# Patient Record
Sex: Male | Born: 1965 | Race: White | Hispanic: No | Marital: Married | Smoking: Never smoker
Health system: Southern US, Community
[De-identification: ages and names within clinical notes are randomized; demographics above are authoritative.]

## PROBLEM LIST (undated history)

## (undated) DIAGNOSIS — IMO0001 Reserved for inherently not codable concepts without codable children: Secondary | ICD-10-CM

## (undated) DIAGNOSIS — E785 Hyperlipidemia, unspecified: Secondary | ICD-10-CM

## (undated) DIAGNOSIS — I10 Essential (primary) hypertension: Secondary | ICD-10-CM

## (undated) HISTORY — DX: Essential (primary) hypertension: I10

## (undated) HISTORY — DX: Hyperlipidemia, unspecified: E78.5

## (undated) HISTORY — PX: LUMBAR DISC SURGERY: SHX700

## (undated) HISTORY — DX: Reserved for inherently not codable concepts without codable children: IMO0001

---

## 2010-07-15 DIAGNOSIS — IMO0001 Reserved for inherently not codable concepts without codable children: Secondary | ICD-10-CM

## 2010-07-15 HISTORY — DX: Reserved for inherently not codable concepts without codable children: IMO0001

## 2011-05-09 ENCOUNTER — Encounter: Payer: Self-pay | Admitting: Internal Medicine

## 2011-05-10 ENCOUNTER — Encounter: Payer: Self-pay | Admitting: Internal Medicine

## 2011-05-10 ENCOUNTER — Ambulatory Visit (INDEPENDENT_AMBULATORY_CARE_PROVIDER_SITE_OTHER): Payer: Managed Care, Other (non HMO) | Admitting: Internal Medicine

## 2011-05-10 DIAGNOSIS — E119 Type 2 diabetes mellitus without complications: Secondary | ICD-10-CM

## 2011-05-10 DIAGNOSIS — E785 Hyperlipidemia, unspecified: Secondary | ICD-10-CM

## 2011-05-10 DIAGNOSIS — I10 Essential (primary) hypertension: Secondary | ICD-10-CM

## 2011-05-10 DIAGNOSIS — E669 Obesity, unspecified: Secondary | ICD-10-CM

## 2011-05-10 NOTE — Patient Instructions (Addendum)
Your weight is stable.  Continue to restrict your starches whenever possible  And add 30 minutes of brisk walking 5 times weekly to help your weight   Consider looking to the Medifast diet (available online) if the Atkins diet doesn't get you the results you want.   Return for labs at your convenience next week or so.

## 2011-05-12 ENCOUNTER — Encounter: Payer: Self-pay | Admitting: Internal Medicine

## 2011-05-12 DIAGNOSIS — I10 Essential (primary) hypertension: Secondary | ICD-10-CM | POA: Insufficient documentation

## 2011-05-12 DIAGNOSIS — E785 Hyperlipidemia, unspecified: Secondary | ICD-10-CM | POA: Insufficient documentation

## 2011-05-12 DIAGNOSIS — IMO0002 Reserved for concepts with insufficient information to code with codable children: Secondary | ICD-10-CM | POA: Insufficient documentation

## 2011-05-12 DIAGNOSIS — E1165 Type 2 diabetes mellitus with hyperglycemia: Secondary | ICD-10-CM | POA: Insufficient documentation

## 2011-05-12 DIAGNOSIS — E669 Obesity, unspecified: Secondary | ICD-10-CM | POA: Insufficient documentation

## 2011-05-12 NOTE — Assessment & Plan Note (Signed)
Spent 10 minutes counselling him on his weight,  Recommended the MediFast diet .  fassting lipids also ordered.

## 2011-05-12 NOTE — Progress Notes (Signed)
  Subjective:    Patient ID: Connor Ross, male    DOB: 07/11/1966, 45 y.o.   MRN: [id number]  HPI  45 yo white male with history of obesity, hyperlipidemia and DM presents for followup..  He has not been exercising buth a recently joined a gym and is planning to start an exericse program soon.  His blood sugars have been well controlled by recent checks.      Review of Systems     Objective:   Physical Exam        Assessment & Plan:

## 2011-05-12 NOTE — Assessment & Plan Note (Addendum)
Last A1c was 7.2 in May without medications.  He was determined to control his diabetes with diet and nutraceuticals alone. However, he has gained 3 more lbs since May.  Will repeat A1c and recommend therapy if higher.

## 2011-05-17 ENCOUNTER — Other Ambulatory Visit (INDEPENDENT_AMBULATORY_CARE_PROVIDER_SITE_OTHER): Payer: Managed Care, Other (non HMO) | Admitting: *Deleted

## 2011-05-17 DIAGNOSIS — E119 Type 2 diabetes mellitus without complications: Secondary | ICD-10-CM

## 2011-05-17 DIAGNOSIS — E785 Hyperlipidemia, unspecified: Secondary | ICD-10-CM

## 2011-05-17 LAB — COMPREHENSIVE METABOLIC PANEL
Alkaline Phosphatase: 58 U/L (ref 39–117)
BUN: 17 mg/dL (ref 6–23)
Glucose, Bld: 194 mg/dL — ABNORMAL HIGH (ref 70–99)
Sodium: 135 mEq/L (ref 135–145)
Total Bilirubin: 0.7 mg/dL (ref 0.3–1.2)

## 2011-05-17 LAB — LIPID PANEL
HDL: 35.2 mg/dL — ABNORMAL LOW (ref >39.00)
LDL Cholesterol: 128 mg/dL — ABNORMAL HIGH (ref 0–99)
VLDL: 36 mg/dL (ref 0.0–40.0)

## 2011-05-17 LAB — MICROALBUMIN / CREATININE URINE RATIO
Creatinine,U: 117.9 mg/dL
Microalb Creat Ratio: 0.7 mg/g (ref 0.0–30.0)

## 2011-05-20 ENCOUNTER — Other Ambulatory Visit: Payer: Self-pay | Admitting: Internal Medicine

## 2011-05-20 DIAGNOSIS — R739 Hyperglycemia, unspecified: Secondary | ICD-10-CM

## 2011-05-20 DIAGNOSIS — E7800 Pure hypercholesterolemia, unspecified: Secondary | ICD-10-CM

## 2011-07-11 ENCOUNTER — Encounter: Payer: Self-pay | Admitting: Internal Medicine

## 2011-08-09 ENCOUNTER — Other Ambulatory Visit (INDEPENDENT_AMBULATORY_CARE_PROVIDER_SITE_OTHER): Payer: Managed Care, Other (non HMO) | Admitting: *Deleted

## 2011-08-09 DIAGNOSIS — E7800 Pure hypercholesterolemia, unspecified: Secondary | ICD-10-CM

## 2011-08-09 DIAGNOSIS — R739 Hyperglycemia, unspecified: Secondary | ICD-10-CM

## 2011-08-09 DIAGNOSIS — R7309 Other abnormal glucose: Secondary | ICD-10-CM

## 2011-08-09 LAB — COMPREHENSIVE METABOLIC PANEL
Albumin: 4.1 g/dL (ref 3.5–5.2)
CO2: 25 mEq/L (ref 19–32)
Calcium: 9 mg/dL (ref 8.4–10.5)
GFR: 122.18 mL/min (ref >60.00)
Glucose, Bld: 214 mg/dL — ABNORMAL HIGH (ref 70–99)
Potassium: 4 mEq/L (ref 3.5–5.1)
Sodium: 136 mEq/L (ref 135–145)
Total Protein: 7 g/dL (ref 6.0–8.3)

## 2011-08-09 LAB — HEMOGLOBIN A1C: Hgb A1c MFr Bld: 9 % — ABNORMAL HIGH (ref 4.6–6.5)

## 2011-08-16 ENCOUNTER — Ambulatory Visit (INDEPENDENT_AMBULATORY_CARE_PROVIDER_SITE_OTHER): Payer: Managed Care, Other (non HMO) | Admitting: Internal Medicine

## 2011-08-16 VITALS — BP 126/78 | HR 84 | Temp 98.6°F | Ht 72.0 in | Wt 325.0 lb

## 2011-08-16 DIAGNOSIS — IMO0001 Reserved for inherently not codable concepts without codable children: Secondary | ICD-10-CM

## 2011-08-16 DIAGNOSIS — E119 Type 2 diabetes mellitus without complications: Secondary | ICD-10-CM

## 2011-08-16 DIAGNOSIS — Z6835 Body mass index (BMI) 35.0-35.9, adult: Secondary | ICD-10-CM

## 2011-08-16 DIAGNOSIS — E669 Obesity, unspecified: Secondary | ICD-10-CM

## 2011-08-16 DIAGNOSIS — E1159 Type 2 diabetes mellitus with other circulatory complications: Secondary | ICD-10-CM

## 2011-08-16 DIAGNOSIS — E785 Hyperlipidemia, unspecified: Secondary | ICD-10-CM

## 2011-08-16 DIAGNOSIS — E1165 Type 2 diabetes mellitus with hyperglycemia: Secondary | ICD-10-CM

## 2011-08-16 DIAGNOSIS — IMO0002 Reserved for concepts with insufficient information to code with codable children: Secondary | ICD-10-CM

## 2011-08-16 MED ORDER — FREESTYLE LANCETS MISC: 1.0000 | Freq: Two times a day (BID) | Status: AC

## 2011-08-16 MED ORDER — INSULIN PEN NEEDLE 31G X 6 MM MISC: Status: DC

## 2011-08-16 MED ORDER — GLUCOSE BLOOD VI STRP: ORAL_STRIP | Status: AC

## 2011-08-16 NOTE — Progress Notes (Signed)
Subjective:    Patient ID: Connor Ross, male    DOB: 09/24/1965, 46 y.o.   MRN: [id number]  HPI  Connor Ross  is a 46 year old white male with a history of diabetes mellitus type 2 , hyperlipidemia, obesity,  hypertension and degenerative joint disease who returns for a three-month diabetes checkup over the last year he has had increasing loss of control of his diabetes due to his continued resistance to taking medication  He has been restricting his starches and to the best of his knowledge but does not exercise daily do to multiple joint complaints. He has gained 10 pounds since his last visit .  Past Medical History  Diagnosis Date  . Diabetes mellitus   . Hypertension   . Hyperlipidemia    No current outpatient prescriptions on file prior to visit.     Review of Systems  Constitutional: Negative for fever, chills, diaphoresis, activity change, appetite change, fatigue and unexpected weight change.  HENT: Negative for hearing loss, ear pain, nosebleeds, congestion, sore throat, facial swelling, rhinorrhea, sneezing, drooling, mouth sores, trouble swallowing, neck pain, neck stiffness, dental problem, voice change, postnasal drip, sinus pressure, tinnitus and ear discharge.   Eyes: Negative for photophobia, pain, discharge, redness, itching and visual disturbance.  Respiratory: Negative for apnea, cough, choking, chest tightness, shortness of breath, wheezing and stridor.   Cardiovascular: Negative for chest pain, palpitations and leg swelling.  Gastrointestinal: Negative for nausea, vomiting, abdominal pain, diarrhea, constipation, blood in stool, abdominal distention, anal bleeding and rectal pain.  Genitourinary: Negative for dysuria, urgency, frequency, hematuria, flank pain, decreased urine volume, scrotal swelling, difficulty urinating and testicular pain.  Musculoskeletal: Positive for arthralgias. Negative for myalgias, back pain, joint swelling and gait problem.  Skin:  Negative for color change, rash and wound.  Neurological: Negative for dizziness, tremors, seizures, syncope, speech difficulty, weakness, light-headedness, numbness and headaches.  Psychiatric/Behavioral: Negative for suicidal ideas, hallucinations, behavioral problems, confusion, sleep disturbance, dysphoric mood, decreased concentration and agitation. The patient is not nervous/anxious.        Objective:   Physical Exam  Constitutional: He is oriented to person, place, and time.       Obese, muscular   HENT:  Head: Normocephalic and atraumatic.  Mouth/Throat: Oropharynx is clear and moist.  Eyes: Conjunctivae and EOM are normal.  Neck: Normal range of motion. Neck supple. No JVD present. No thyromegaly present.  Cardiovascular: Normal rate, regular rhythm and normal heart sounds.   Pulmonary/Chest: Effort normal and breath sounds normal. He has no wheezes. He has no rales.  Abdominal: Soft. Bowel sounds are normal. He exhibits no mass. There is no tenderness. There is no rebound.  Musculoskeletal: Normal range of motion. He exhibits no edema.  Neurological: He is alert and oriented to person, place, and time.  Skin: Skin is warm and dry.  Psychiatric: He has a normal mood and affect.          Assessment & Plan:   Diabetes mellitus type 2, uncontrolled Despite his misgivings he has consented to use of insulin now that his hemoglobin A1c is 9.0. Spends 40 minutes today discussing diet and exercise as adjunct therapy to medications. Recommended that he begin an aerobic exercise program and submit a food diary to me to look for him carbohydrates and other causes of his continued weight gain. He was given samples of  Levemir flexpens and shown how to use them. He will start with 20 units daily plan return in one month with a log  of his blood sugars.  Severe obesity (BMI 35.0-35.9 with comorbidity) His BMI is over 40 was complicated by diabetes hyperlipidemia and DJD. I have been  counseling him for over 2 years on the need for daily aerobic exercise. He continues to site joint pain and work schedule as barriers to daily exercise. I have recommended swimming as a good part of exercise and have counseled him on his need to lose 100 pounds over the next year. There are several diets which would be helpful for him including manifest in weight watchers but he prefers to do things on his own  Hyperlipidemia LDL goal < 70 His LDL is 125 and his triglycerides are 2:15 by her recent fasting lipids. He will not consider statin therapy at this time. We will repeat his lipids in 3 months with his hemoglobin A1c and if his LDL has not improved I will strongly recommend a trial of radius rice or pravastatin.    Updated Medication List Outpatient Encounter Prescriptions as of 08/16/2011  Medication Sig Dispense Refill  . glucose blood (FREESTYLE LITE) test strip Use as directed - Check blood sugar twice daily DX 250.02  100 each  12  . insulin detemir (LEVEMIR) 100 UNIT/ML injection Inject 20 Units into the skin at bedtime.  3 mL  2  . Insulin Pen Needle 31G X 6 MM MISC Use with Levemir once daily - DX 250.02  30 each  6  . Lancets (FREESTYLE) lancets 1 each by Other route 2 (two) times daily. DX 250.02  100 each  12

## 2011-08-16 NOTE — Patient Instructions (Addendum)
You need to lose 100 lbs over the next year,  start once daily insulin  and begin a daily exercise to get your body to to get your sugars down  Start with 20 units of Levemir once daily in the evening.  Keep a diary of your food intake for 3 days and I will review it and make suggestions.  Check your blood sugars fasting and 2 hrs post dinner for 2 weeks and send to me .

## 2011-08-18 ENCOUNTER — Encounter: Payer: Self-pay | Admitting: Internal Medicine

## 2011-08-18 DIAGNOSIS — E669 Obesity, unspecified: Secondary | ICD-10-CM | POA: Insufficient documentation

## 2011-08-18 DIAGNOSIS — E785 Hyperlipidemia, unspecified: Secondary | ICD-10-CM | POA: Insufficient documentation

## 2011-08-18 DIAGNOSIS — R809 Proteinuria, unspecified: Secondary | ICD-10-CM | POA: Insufficient documentation

## 2011-08-18 DIAGNOSIS — E1129 Type 2 diabetes mellitus with other diabetic kidney complication: Secondary | ICD-10-CM | POA: Insufficient documentation

## 2011-08-18 DIAGNOSIS — E1159 Type 2 diabetes mellitus with other circulatory complications: Secondary | ICD-10-CM | POA: Insufficient documentation

## 2011-08-18 MED ORDER — INSULIN DETEMIR 100 UNIT/ML ~~LOC~~ SOLN: 20.0000 [IU] | Freq: Every day | SUBCUTANEOUS | Status: DC

## 2011-08-18 NOTE — Assessment & Plan Note (Signed)
His BMI is over 40 was complicated by diabetes hyperlipidemia and DJD. I have been counseling him for over 2 years on the need for daily aerobic exercise. He continues to site joint pain and work schedule as barriers to daily exercise. I have recommended swimming as a good part of exercise and have counseled him on his need to lose 100 pounds over the next year. There are several diets which would be helpful for him including manifest in weight watchers but he prefers to do things on his own

## 2011-08-18 NOTE — Assessment & Plan Note (Signed)
Despite his misgivings he has consented to use of insulin now that his hemoglobin A1c is 9.0. Spends 40 minutes today discussing diet and exercise as adjunct therapy to medications. Recommended that he begin an aerobic exercise program and submit a food diary to me to look for him carbohydrates and other causes of his continued weight gain. He was given samples of  Levemir flexpens and shown how to use them. He will start with 20 units daily plan return in one month with a log of his blood sugars.

## 2011-08-18 NOTE — Assessment & Plan Note (Signed)
His LDL is 125 and his triglycerides are 2:15 by her recent fasting lipids. He will not consider statin therapy at this time. We will repeat his lipids in 3 months with his hemoglobin A1c and if his LDL has not improved I will strongly recommend a trial of radius rice or pravastatin.

## 2011-08-20 ENCOUNTER — Telehealth: Payer: Self-pay | Admitting: *Deleted

## 2011-08-20 NOTE — Telephone Encounter (Signed)
Resume the metformin AND  increase the insulin to 24 units daily

## 2011-08-20 NOTE — Telephone Encounter (Signed)
Patient says that he has really been watching what he eats and is taking his 20 units of insulin a day. He says that his sugar is still running around 249. This was taken after eating about 6 oz of chicken and mixed veggies. He is asking if he needs to increase his insulin or if he should take the metformin along with the insulin.

## 2011-08-20 NOTE — Telephone Encounter (Signed)
Patient notified

## 2011-09-12 ENCOUNTER — Telehealth: Payer: Self-pay | Admitting: Internal Medicine

## 2011-09-12 NOTE — Telephone Encounter (Signed)
It's a little too early to repeat his labs,  They were done jan 25th and insurance won't pay if we repeat them before April 25th, so if he has been keeping a log of his blood sugars which we asked him to do ) he just needs to bring that.  If he hasn't been keeping a log or checking them,  Move his appt out to after April 25th and have him come for labs aroudn 4/25:  hgba1c  And CMET

## 2011-09-12 NOTE — Telephone Encounter (Signed)
Patient is scheduled for a follow up appointment on March 8th does he needs labs done before for coming if so he said he can come March 1st.

## 2011-09-12 NOTE — Telephone Encounter (Signed)
Left message asking patient to return my call.

## 2011-09-13 ENCOUNTER — Ambulatory Visit: Payer: Managed Care, Other (non HMO) | Admitting: Internal Medicine

## 2011-09-13 NOTE — Telephone Encounter (Signed)
Notified patient of message.  He has been keeping a log of blood sugars so he will bring that to his appt.

## 2011-09-20 ENCOUNTER — Encounter: Payer: Self-pay | Admitting: Internal Medicine

## 2011-09-20 ENCOUNTER — Ambulatory Visit (INDEPENDENT_AMBULATORY_CARE_PROVIDER_SITE_OTHER): Payer: Managed Care, Other (non HMO) | Admitting: Internal Medicine

## 2011-09-20 VITALS — BP 132/78 | HR 100 | Temp 97.9°F | Resp 16 | Ht 71.0 in | Wt 306.5 lb

## 2011-09-20 DIAGNOSIS — IMO0001 Reserved for inherently not codable concepts without codable children: Secondary | ICD-10-CM

## 2011-09-20 DIAGNOSIS — IMO0002 Reserved for concepts with insufficient information to code with codable children: Secondary | ICD-10-CM

## 2011-09-20 DIAGNOSIS — J019 Acute sinusitis, unspecified: Secondary | ICD-10-CM

## 2011-09-20 DIAGNOSIS — E785 Hyperlipidemia, unspecified: Secondary | ICD-10-CM

## 2011-09-20 DIAGNOSIS — E669 Obesity, unspecified: Secondary | ICD-10-CM

## 2011-09-20 DIAGNOSIS — E1165 Type 2 diabetes mellitus with hyperglycemia: Secondary | ICD-10-CM

## 2011-09-20 MED ORDER — AMOXICILLIN-POT CLAVULANATE 875-125 MG PO TABS: 1.0000 | ORAL_TABLET | Freq: Two times a day (BID) | ORAL | Status: AC

## 2011-09-20 NOTE — Patient Instructions (Addendum)
I am treating you for a sinus/ear infection with augmentin twice daly for 7 days  Gargle with salt water for sore throat.  Use Sudafed PE  10 to 30 mg every 6 hours  For congestion  Use OTC Simply Saline  Twice daily in AM and PM to flush sinuses  Try OTC Delsym or robitussin for the cough  ---------------------------------------------------------------------------------------------- Do not suspend the metformin even if sugars are < 120   The glipizide has to be taken just before a meal.   If you start having sugars below 80 fasting,  Reduce the levemir to 10 units at bedtime  And suspend the glipizide   Repeat your labs mid May

## 2011-09-20 NOTE — Progress Notes (Signed)
Subjective:    Patient ID: Connor Ross, male    DOB: [birth year], 46 y.o.   MRN: [id number]  HPI Connor Ross retruesn for followup on diabetes, with recent initiatin of levemir insulin for uncontrolled. Diabetes.  He is comfortable using the medication along with his oral medications, glipizide and metformin and is not having any hypoglycemic episodes.  He has altered his diet.  CBGs are now averaging 80 to 140.  He is taking his glipizide incorrectly  after dinner.  His 2 nd issue is persistent sinus drainage accompanied by headaches for the past  2-3 days.    Past Medical History  Diagnosis Date  . Diabetes mellitus   . Hypertension   . Hyperlipidemia     Current Outpatient Prescriptions on File Prior to Visit  Medication Sig Dispense Refill  . glucose blood (FREESTYLE LITE) test strip Use as directed - Check blood sugar twice daily DX 250.02  100 each  12  . Insulin Pen Needle 31G X 6 MM MISC Use with Levemir once daily - DX 250.02  30 each  6  . Lancets (FREESTYLE) lancets 1 each by Other route 2 (two) times daily. DX 250.02  100 each  12    Review of Systems  Constitutional: Negative for fever, chills, diaphoresis, activity change, appetite change, fatigue and unexpected weight change.  HENT: Positive for congestion, postnasal drip and sinus pressure. Negative for hearing loss, ear pain, nosebleeds, sore throat, facial swelling, rhinorrhea, sneezing, drooling, mouth sores, trouble swallowing, neck pain, neck stiffness, dental problem, voice change, tinnitus and ear discharge.   Eyes: Negative for photophobia, pain, discharge, redness, itching and visual disturbance.  Respiratory: Negative for apnea, cough, choking, chest tightness, shortness of breath, wheezing and stridor.   Cardiovascular: Negative for chest pain, palpitations and leg swelling.  Gastrointestinal: Negative for nausea, vomiting, abdominal pain, diarrhea, constipation, blood in stool, abdominal distention,  anal bleeding and rectal pain.  Genitourinary: Negative for dysuria, urgency, frequency, hematuria, flank pain, decreased urine volume, scrotal swelling, difficulty urinating and testicular pain.  Musculoskeletal: Negative for myalgias, back pain, joint swelling, arthralgias and gait problem.  Skin: Negative for color change, rash and wound.  Neurological: Positive for headaches. Negative for dizziness, tremors, seizures, syncope, speech difficulty, weakness, light-headedness and numbness.  Psychiatric/Behavioral: Negative for suicidal ideas, hallucinations, behavioral problems, confusion, sleep disturbance, dysphoric mood, decreased concentration and agitation. The patient is not nervous/anxious.        Objective:   Physical Exam  Constitutional: He is oriented to person, place, and time.  HENT:  Head: Normocephalic and atraumatic.  Mouth/Throat: Oropharynx is clear and moist.  Eyes: Conjunctivae and EOM are normal.  Neck: Normal range of motion. Neck supple. No JVD present. No thyromegaly present.  Cardiovascular: Normal rate, regular rhythm and normal heart sounds.   Pulmonary/Chest: Effort normal and breath sounds normal. He has no wheezes. He has no rales.  Abdominal: Soft. Bowel sounds are normal. He exhibits no mass. There is no tenderness. There is no rebound.  Musculoskeletal: Normal range of motion. He exhibits no edema.  Neurological: He is alert and oriented to person, place, and time.  Skin: Skin is warm and dry.  Psychiatric: He has a normal mood and affect.      Assessment & Plan:   Sinusitis acute Given severity development of facial pain and exam consistent with bacterial URI,  Will treat with empiric antibiotics, decongestants, and saline lavage.    Severe obesity (BMI 35.0-35.9 with comorbidity) He has lost a  significant amount of weight in the last 3 months using the low glycemic index diet. Encouragement given.    Diabetes mellitus type 2, uncontrolled Improved  with use of levemir, glipizide, metformin and low glycemic index.   Hyperlipidemia LDL goal < 70 Uncontrolled by January labs.  LDL > 120, trigs > 200.  Addressed with low glycemic index diet .  Repeat lipids in one month.    Updated Medication List Outpatient Encounter Prescriptions as of 09/20/2011  Medication Sig Dispense Refill  . glipiZIDE (GLUCOTROL) 10 MG tablet Take 10 mg by mouth daily.      [name] glucose blood (FREESTYLE LITE) test strip Use as directed - Check blood sugar twice daily DX 250.02  100 each  12  . insulin detemir (LEVEMIR) 100 UNIT/ML injection Inject 24 Units into the skin at bedtime.      . Insulin Pen Needle 31G X 6 MM MISC Use with Levemir once daily - DX 250.02  30 each  6  . Lancets (FREESTYLE) lancets 1 each by Other route 2 (two) times daily. DX 250.02  100 each  12  . metformin (FORTAMET) 1000 MG (OSM) 24 hr tablet Take 1,000 mg by mouth daily with breakfast.      . DISCONTD: insulin detemir (LEVEMIR) 100 UNIT/ML injection Inject 20 Units into the skin at bedtime.  3 mL  2  . amoxicillin-clavulanate (AUGMENTIN) 875-125 MG per tablet Take 1 tablet by mouth 2 (two) times daily.  14 tablet  0

## 2011-09-22 ENCOUNTER — Encounter: Payer: Self-pay | Admitting: Internal Medicine

## 2011-09-22 NOTE — Assessment & Plan Note (Signed)
He has lost a significant amount of weight in the last 3 months using the low glycemic index diet. Encouragement given.

## 2011-09-22 NOTE — Assessment & Plan Note (Signed)
Uncontrolled by January labs.  LDL > 120, trigs > 200.  Addressed with low glycemic index diet .  Repeat lipids in one month.

## 2011-09-22 NOTE — Assessment & Plan Note (Signed)
Improved with use of levemir, glipizide, metformin and low glycemic index.

## 2011-09-22 NOTE — Assessment & Plan Note (Signed)
Given severity development of facial pain and exam consistent with bacterial URI,  Will treat with empiric antibiotics, decongestants, and saline lavage.

## 2011-11-01 ENCOUNTER — Telehealth: Payer: Self-pay | Admitting: Internal Medicine

## 2011-11-01 MED ORDER — GLIPIZIDE 10 MG PO TABS: 10.0000 mg | ORAL_TABLET | Freq: Two times a day (BID) | ORAL | Status: DC

## 2011-11-01 MED ORDER — METFORMIN HCL ER (OSM) 1000 MG PO TB24: 1000.0000 mg | ORAL_TABLET | Freq: Every day | ORAL | Status: DC

## 2011-11-01 NOTE — Telephone Encounter (Signed)
Patient notified of med change.  He wanted to let you know that he can no longer take insulin to do his job as a [employer].  He found that out a few weeks ago and has been off of it since, he stated the blood sugars have been running pretty good, this morning fasting was 113.

## 2011-11-01 NOTE — Telephone Encounter (Signed)
Chart updated

## 2011-11-01 NOTE — Telephone Encounter (Signed)
I have patient taking glipizide 10 mg per chart, and metformin once a day per chart.  I could not find in the chart where it had been changed, do you recall changing patients medication.  Please advise

## 2011-11-01 NOTE — Telephone Encounter (Signed)
He should be taking 10 mg of glipizide twice daily before breakfast and dinner.  This is the maximal dose and based on his last A1c,  He needs it so that is what I will send to [facility].  The metformin that is in his chart is the Extended release version is is supposed to be taken once daily.

## 2011-11-01 NOTE — Telephone Encounter (Signed)
Glipizide 5 mg, metformin 1000 mg x2 a day, he would like a 90 day supply he uses [facility] on [address].I advised patient that we prefer they have pharmacy fax prescriptions, he will do that next time.

## 2011-11-29 ENCOUNTER — Other Ambulatory Visit (INDEPENDENT_AMBULATORY_CARE_PROVIDER_SITE_OTHER): Payer: Managed Care, Other (non HMO) | Admitting: *Deleted

## 2011-11-29 DIAGNOSIS — IMO0001 Reserved for inherently not codable concepts without codable children: Secondary | ICD-10-CM

## 2011-11-29 DIAGNOSIS — E785 Hyperlipidemia, unspecified: Secondary | ICD-10-CM

## 2011-11-29 DIAGNOSIS — E1165 Type 2 diabetes mellitus with hyperglycemia: Secondary | ICD-10-CM

## 2011-11-29 DIAGNOSIS — IMO0002 Reserved for concepts with insufficient information to code with codable children: Secondary | ICD-10-CM

## 2011-11-29 LAB — COMPREHENSIVE METABOLIC PANEL
BUN: 18 mg/dL (ref 6–23)
CO2: 24 mEq/L (ref 19–32)
Calcium: 9.1 mg/dL (ref 8.4–10.5)
Chloride: 105 mEq/L (ref 96–112)
Creatinine, Ser: 0.9 mg/dL (ref 0.4–1.5)
GFR: 101 mL/min (ref >60.00)
Glucose, Bld: 149 mg/dL — ABNORMAL HIGH (ref 70–99)
Total Bilirubin: 0.7 mg/dL (ref 0.3–1.2)

## 2011-11-29 LAB — LIPID PANEL
Cholesterol: 216 mg/dL — ABNORMAL HIGH (ref 0–200)
HDL: 36.2 mg/dL — ABNORMAL LOW (ref >39.00)
Triglycerides: 154 mg/dL — ABNORMAL HIGH (ref 0.0–149.0)

## 2011-11-29 LAB — HEMOGLOBIN A1C: Hgb A1c MFr Bld: 6.9 % — ABNORMAL HIGH (ref 4.6–6.5)

## 2011-12-06 ENCOUNTER — Other Ambulatory Visit: Payer: Managed Care, Other (non HMO)

## 2011-12-13 ENCOUNTER — Ambulatory Visit (INDEPENDENT_AMBULATORY_CARE_PROVIDER_SITE_OTHER): Payer: Managed Care, Other (non HMO) | Admitting: Internal Medicine

## 2011-12-13 ENCOUNTER — Encounter: Payer: Self-pay | Admitting: Internal Medicine

## 2011-12-13 VITALS — BP 120/72 | HR 80 | Temp 98.1°F | Resp 16 | Wt 323.0 lb

## 2011-12-13 DIAGNOSIS — IMO0001 Reserved for inherently not codable concepts without codable children: Secondary | ICD-10-CM | POA: Insufficient documentation

## 2011-12-13 DIAGNOSIS — E785 Hyperlipidemia, unspecified: Secondary | ICD-10-CM

## 2011-12-13 DIAGNOSIS — IMO0002 Reserved for concepts with insufficient information to code with codable children: Secondary | ICD-10-CM

## 2011-12-13 DIAGNOSIS — M545 Low back pain, unspecified: Secondary | ICD-10-CM

## 2011-12-13 DIAGNOSIS — E1165 Type 2 diabetes mellitus with hyperglycemia: Secondary | ICD-10-CM

## 2011-12-13 DIAGNOSIS — M48061 Spinal stenosis, lumbar region without neurogenic claudication: Secondary | ICD-10-CM | POA: Insufficient documentation

## 2011-12-13 DIAGNOSIS — M79604 Pain in right leg: Secondary | ICD-10-CM

## 2011-12-13 LAB — HM DIABETES EYE EXAM: HM Diabetic Eye Exam: NORMAL

## 2011-12-13 MED ORDER — METFORMIN HCL 1000 MG PO TABS: 1000.0000 mg | ORAL_TABLET | Freq: Two times a day (BID) | ORAL | Status: AC

## 2011-12-13 NOTE — Assessment & Plan Note (Addendum)
Has intolerance to statins due to joint pain   Not interested in resuming therapy despite LDL being 154.  Triglycerides and HDL have improved. He has had a normal stress test in the last 10 years.

## 2011-12-13 NOTE — Progress Notes (Signed)
Patient ID: Connor Ross, male   DOB: April 20, 1966, 46 y.o.   MRN: [id number]  Patient Active Problem List  Diagnoses  . Diabetes mellitus type 2, uncontrolled  . Hyperlipidemia LDL goal < 70  . Severe obesity (BMI 35.0-35.9 with comorbidity)  . Sinusitis acute  . Low back pain radiating to right leg  . Normal cardiac stress test    Subjective:  CC:   Chief Complaint  Patient presents with  . Follow-up    HPI:   Connor Ross is a 46 y.o. male who presents for follow up on Diabetes Mellitus.  3 months ago he made a concerted effort to lower his blood sugars and Hgba1c by losing weight through dietary restrictions of carbohydrates, and regular exercise. follow up.  He was walking and playing softball regularly but had an accident at work on April 3 which caused 3 herniated disks.  The accident occurred while pushing a truck dolly which hit a curb and flung him like a rag doll through the air.  Pain initially radiated to right knee/calf and caused recurrent cramping.  Going to PT and [facility],  spinal stenosis on MRI  done at [facility] ordered by [employer] called [facility].  Has an appt on Monday to see if epidural steroids will help with the pain.  He has refused to use narcotics.  He really doesn't want to have epidural injections.  Using some kind of pain patch applied by PT but having difficulty keeping it on. He receives it 3 times weekly at [facility] on [address].    He was prescribed levemir after last visit but is using only metfromin and glipizide due to work restricting the use of any type of insulin.  His cbgs have been elevated to 170 or 180 but usually 140  Fasting.  Recent a1c was 6.9.  He is having a lot of diarrhea despite the change to ER so he is requesting to generic twice daily .     Past Medical History  Diagnosis Date  . Diabetes mellitus   . Hypertension   . Hyperlipidemia   . Normal cardiac stress test 2012   [facility]    Past Surgical History  Procedure Date  . Lumbar disc surgery          The following portions of the patient's history were reviewed and updated as appropriate: Allergies, current medications, and problem list.    Review of Systems:   12 Pt  review of systems was negative except those addressed in the HPI,     History   Social History  . Marital Status: Legally Separated    Spouse Name: N/A    Number of Children: N/A  . Years of Education: N/A   Occupational History  . Not on file.   Social History Main Topics  . Smoking status: Never Smoker   . Smokeless tobacco: Never Used  . Alcohol Use: No  . Drug Use: No  . Sexually Active: Not on file   Other Topics Concern  . Not on file   Social History Narrative  . No narrative on file    Objective:  BP 120/72  Pulse 80  Temp(Src) 98.1 F (36.7 C) (Oral)  Resp 16  Wt 323 lb (146.512 kg)  SpO2 94%  General appearance: alert, cooperative and appears stated age Ears: normal TM's and external ear canals both ears Throat: lips, mucosa, and tongue normal; teeth and gums normal Neck: no adenopathy, no carotid  bruit, supple, symmetrical, trachea midline and thyroid not enlarged, symmetric, no tenderness/mass/nodules Back: symmetric, no curvature. ROM normal. No CVA tenderness. Lungs: clear to auscultation bilaterally Heart: regular rate and rhythm, S1, S2 normal, no murmur, click, rub or gallop Abdomen: soft, non-tender; bowel sounds normal; no masses,  no organomegaly Pulses: 2+ and symmetric Skin: Skin color, texture, turgor normal. No rashes or lesions Lymph nodes: Cervical, supraclavicular, and axillary nodes normal.  Assessment and Plan:  Hyperlipidemia LDL goal < 70 Has intolerance to statins due to joint pain   Not interested in resuming therapy despite LDL being 154.  Triglycerides and HDL have improved. He has had a normal stress test in the last 10 years.   Diabetes mellitus type 2,  uncontrolled Dramatic improvement to goal.  Continue  glipizide amd metformin for a1c 6.9.  Up to date with eye exams  At [facility]    Updated Medication List Outpatient Encounter Prescriptions as of 12/13/2011  Medication Sig Dispense Refill  . glipiZIDE (GLUCOTROL) 10 MG tablet Take 1 tablet (10 mg total) by mouth 2 (two) times daily before a meal.  180 tablet  3  . glucose blood (FREESTYLE LITE) test strip Use as directed - Check blood sugar twice daily DX 250.02  100 each  12  . Insulin Pen Needle 31G X 6 MM MISC Use with Levemir once daily - DX 250.02  30 each  6  . Lancets (FREESTYLE) lancets 1 each by Other route 2 (two) times daily. DX 250.02  100 each  12  . DISCONTD: metformin (FORTAMET) 1000 MG (OSM) 24 hr tablet Take 1 tablet (1,000 mg total) by mouth daily with breakfast.  90 tablet  3  . metFORMIN (GLUCOPHAGE) 1000 MG tablet Take 1 tablet (1,000 mg total) by mouth 2 (two) times daily with a meal.  180 tablet  3     Orders Placed This Encounter  Procedures  . Lipid panel  . COMPLETE METABOLIC PANEL WITH GFR  . Hemoglobin A1c    Return in about 3 months (around 03/14/2012).

## 2011-12-13 NOTE — Assessment & Plan Note (Addendum)
Dramatic improvement to goal.  Continue  glipizide amd metformin for a1c 6.9.  Up to date with eye exams  At [facility]

## 2011-12-15 ENCOUNTER — Encounter: Payer: Self-pay | Admitting: Internal Medicine

## 2012-03-11 ENCOUNTER — Other Ambulatory Visit: Payer: Managed Care, Other (non HMO)

## 2012-03-18 ENCOUNTER — Ambulatory Visit: Payer: Managed Care, Other (non HMO) | Admitting: Internal Medicine

## 2012-04-03 ENCOUNTER — Emergency Department: Payer: Self-pay | Admitting: Internal Medicine

## 2012-04-03 LAB — CBC
HCT: 45.5 % (ref 40.0–52.0)
HGB: 15.6 g/dL (ref 13.0–18.0)
MCH: 30.6 pg (ref 26.0–34.0)
MCV: 89 fL (ref 80–100)
Platelet: 228 10*3/uL (ref 150–440)
RBC: 5.1 10*6/uL (ref 4.40–5.90)
RDW: 14.4 % (ref 11.5–14.5)
WBC: 8.7 10*3/uL (ref 3.8–10.6)

## 2012-04-03 LAB — URINALYSIS, COMPLETE
Bilirubin,UR: NEGATIVE
Blood: NEGATIVE
Ketone: NEGATIVE
Ph: 5 (ref 4.5–8.0)
Protein: NEGATIVE
Squamous Epithelial: 1

## 2012-04-03 LAB — COMPREHENSIVE METABOLIC PANEL
Anion Gap: 13 (ref 7–16)
BUN: 18 mg/dL (ref 7–18)
Bilirubin,Total: 0.5 mg/dL (ref 0.2–1.0)
Calcium, Total: 9 mg/dL (ref 8.5–10.1)
Chloride: 98 mmol/L (ref 98–107)
Co2: 26 mmol/L (ref 21–32)
EGFR (African American): >60
EGFR (Non-African Amer.): >60
Osmolality: 289 (ref 275–301)
Potassium: 3.8 mmol/L (ref 3.5–5.1)
SGOT(AST): 13 U/L — ABNORMAL LOW (ref 15–37)
Sodium: 137 mmol/L (ref 136–145)
Total Protein: 7.6 g/dL (ref 6.4–8.2)

## 2012-04-03 LAB — LIPASE, BLOOD: Lipase: 108 U/L (ref 73–393)

## 2012-07-01 ENCOUNTER — Other Ambulatory Visit: Payer: Self-pay | Admitting: Specialist

## 2012-07-01 DIAGNOSIS — M545 Low back pain, unspecified: Secondary | ICD-10-CM

## 2012-07-10 ENCOUNTER — Ambulatory Visit
Admission: RE | Admit: 2012-07-10 | Discharge: 2012-07-10 | Disposition: A | Discharge status: Home / Self Care | Payer: Worker's Compensation | Source: Ambulatory Visit | Attending: Specialist | Admitting: Specialist

## 2012-07-10 ENCOUNTER — Other Ambulatory Visit: Payer: Self-pay | Admitting: Specialist

## 2012-07-10 ENCOUNTER — Inpatient Hospital Stay
Admission: RE | Admit: 2012-07-10 | Discharge: 2012-07-10 | Disposition: A | Discharge status: Home / Self Care | Payer: Self-pay | Source: Ambulatory Visit | Attending: Specialist | Admitting: Specialist

## 2012-07-10 VITALS — BP 114/72 | HR 82

## 2012-07-10 DIAGNOSIS — M545 Low back pain, unspecified: Secondary | ICD-10-CM

## 2012-07-10 DIAGNOSIS — M549 Dorsalgia, unspecified: Secondary | ICD-10-CM

## 2012-07-10 MED ORDER — IOHEXOL 180 MG/ML  SOLN
20.0000 mL | Freq: Once | INTRAMUSCULAR | Status: AC | PRN
  Administered 2012-07-10: 20 mL via INTRATHECAL

## 2012-07-10 MED ORDER — ONDANSETRON HCL 4 MG/2ML IJ SOLN: 4.0000 mg | Freq: Four times a day (QID) | INTRAMUSCULAR | Status: DC | PRN

## 2012-07-10 MED ORDER — DIAZEPAM 5 MG PO TABS: 10.0000 mg | ORAL_TABLET | Freq: Once | ORAL | Status: AC

## 2012-07-21 ENCOUNTER — Other Ambulatory Visit: Payer: Self-pay | Admitting: Internal Medicine

## 2012-08-29 ENCOUNTER — Other Ambulatory Visit: Payer: Self-pay

## 2012-11-13 ENCOUNTER — Telehealth: Payer: Self-pay | Admitting: Internal Medicine

## 2012-11-13 DIAGNOSIS — E119 Type 2 diabetes mellitus without complications: Secondary | ICD-10-CM

## 2012-11-13 NOTE — Telephone Encounter (Signed)
I can't order it because I do not have a copy of the sleep study bc I didn't order the study and I don't know what his settings are.  Also he has not been seen in over 11 months and as a diabetic should be seen every 3 months. He will need a 30 minute visit and fasting labs prior to app.

## 2012-11-13 NOTE — Telephone Encounter (Signed)
Pt called he needs rx for cpap mask. [facility] where he is going to get this Please advise when rx is ready.  [facility]  Dr Darrick Huntsman did  Not prescribe the cpap machine.  He got this through work

## 2012-11-16 ENCOUNTER — Other Ambulatory Visit: Payer: Self-pay | Admitting: *Deleted

## 2012-11-16 NOTE — Telephone Encounter (Signed)
Patient notified as requested by phone detailed message left on voicemail per DPR.

## 2013-01-16 ENCOUNTER — Emergency Department: Payer: Self-pay | Admitting: Emergency Medicine

## 2013-04-26 IMAGING — CT CT L SPINE W/ CM
4 of 10 series · 12 of 33 positions shown, 14 images · non-contrast
Comparison: Outside MRI from [REDACTED] could not be digitized.

MYELOGRAM INJECTION
TECHNIQUE: Informed consent was obtained from the patient prior to
the procedure, including potential complications of headache,
allergy, infection and pain. Specific instructions were given
regarding 24 hour bedrest post procedure to prevent post-LP
headache.  A timeout procedure was performed.  With the patient
prone, the lower back was prepped with Betadine.  1% Lidocaine was
used for local anesthesia.
CLINICAL DATA: Low back pain.  [employer] compensation.   Right leg
pain.
TECHNIQUE: Multidetector CT imaging of the lumbar spine was
performed following myelography.  Multiplanar CT image
reconstructions were also generated.

Image quality is reduced due to the patient's body habitus (323
pounds).

[Series 2: l spine bone · axial · 0.27mm/px · z∈[-171,-96]mm · 2 of 91 slices shown, 3 images]
[im 31/91  soft-tissue]
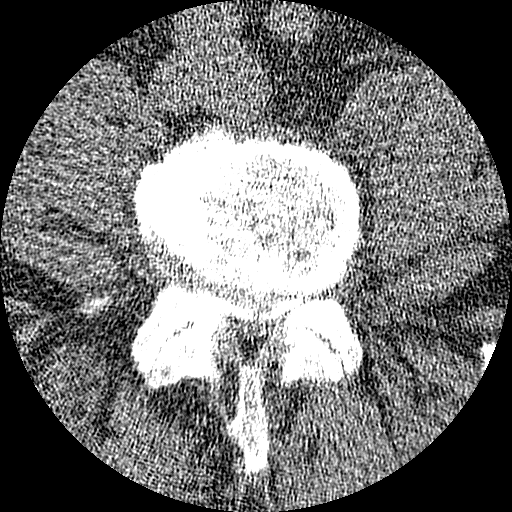
[im 31/91  bone]
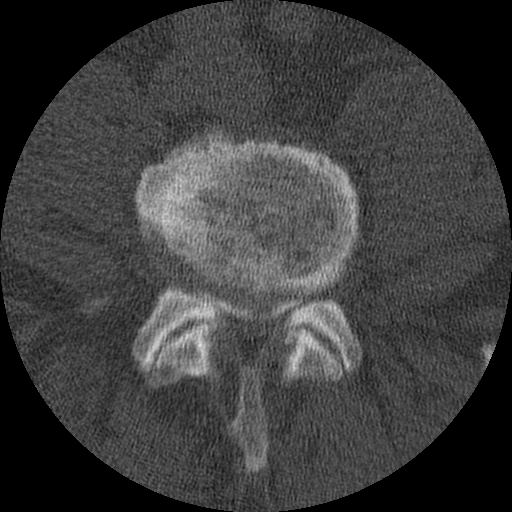
[im 61/91  bone]
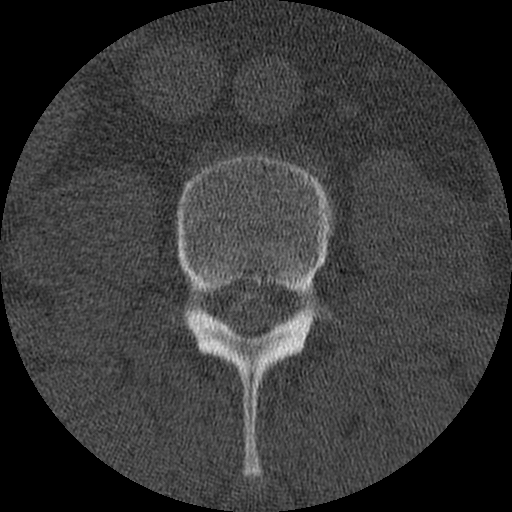

[Series 3: l spine soft · axial · 0.27mm/px · z∈[-171,-96]mm · 2 of 91 slices shown]
[im 31/91  soft-tissue]
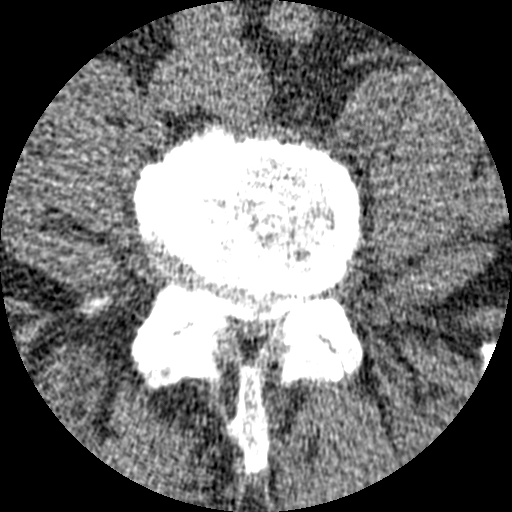
[im 61/91  soft-tissue]
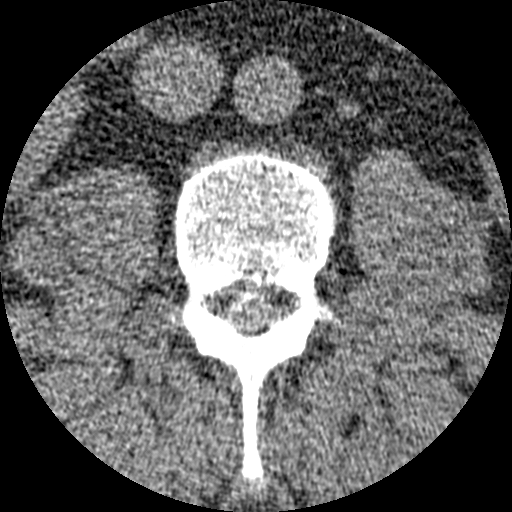

[Series 400: coronal · coronal · 0.45mm/px · 3 of 61 slices shown]
[im 13/61  bone]
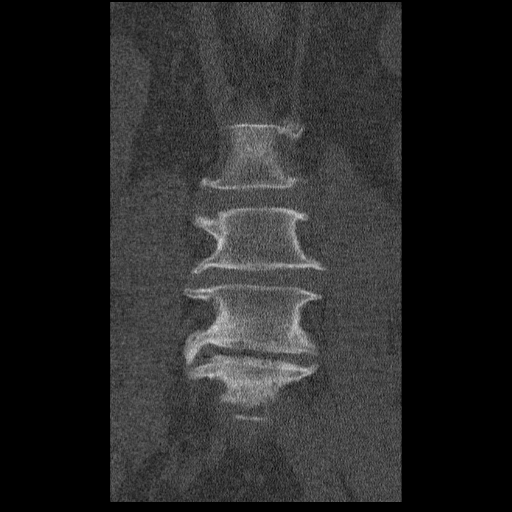
[im 25/61  bone]
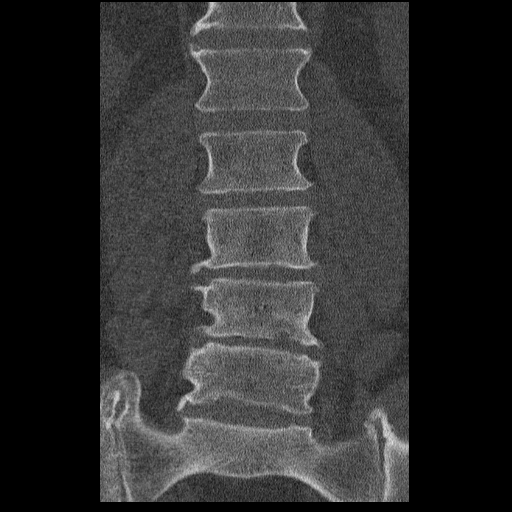
[im 37/61  bone]
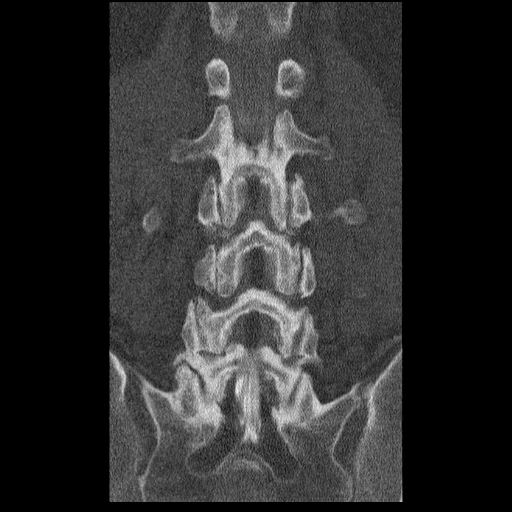

[Series 401: sagittal · sagittal · 0.45mm/px · 5 of 44 slices shown, 6 images]
[im 15/44  bone]
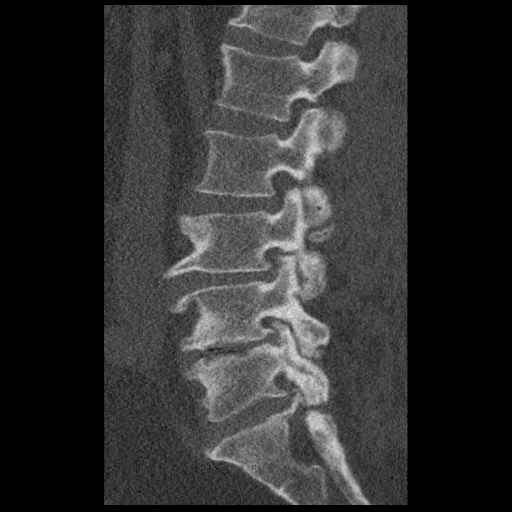
[im 18/44  bone]
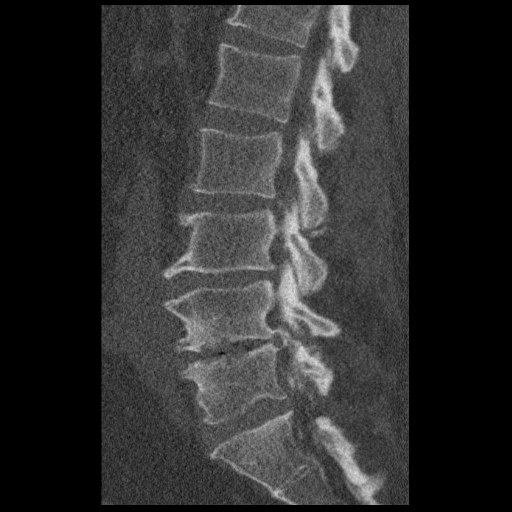
[im 22/44  soft-tissue]
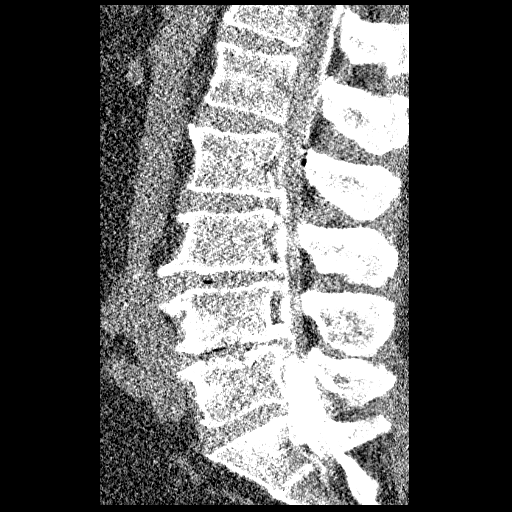
[im 22/44  bone]
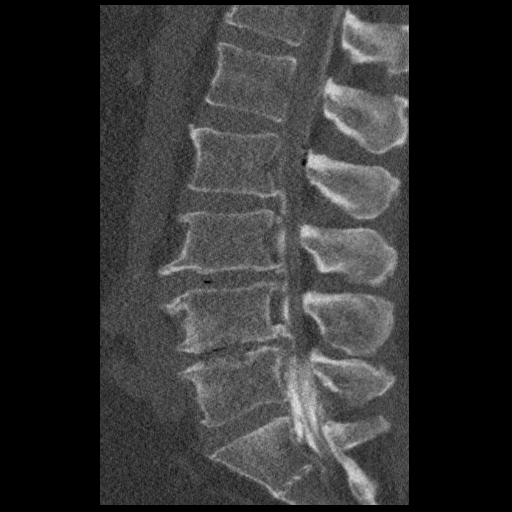
[im 26/44  bone]
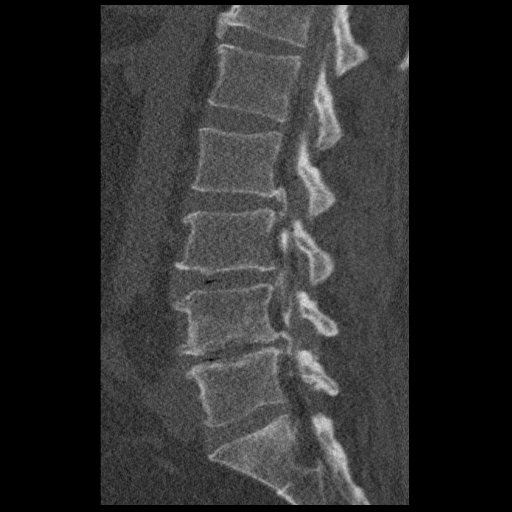
[im 29/44  bone]
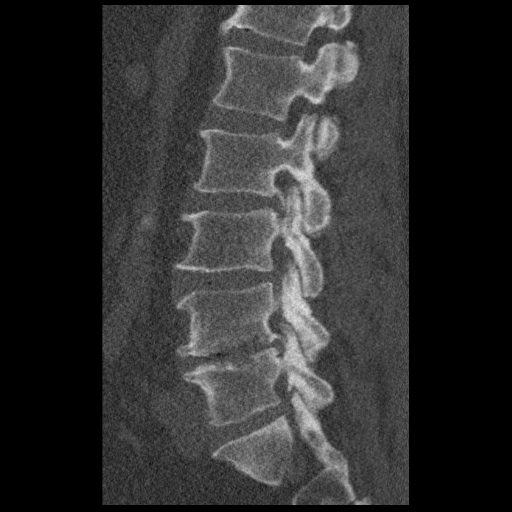

[12 of 33 positions shown; findings below may reference images not displayed]

I personally performed the lumbar puncture and administered the
intrathecal contrast. I also personally supervised acquisition of
the myelogram images.

I attempted lumbar puncture using 5 inch  needles at L2-3 and L5-
S1.  Mixed injection was obtained at both levels.  There was poor
return of CSF at L5-S1 due to the near total block at the L4-5
level.  There was mixed injection at L2-L3 due to moderate
stenosis.  Spinal canal was too narrow to attempt to puncture at L3-
4 and L4-5.

20 cc of Omnipaque 180 was injected into the subarachnoid and
subdural spaces.
IMPRESSION: Successful injection of  intrathecal contrast for myelography.

MYELOGRAM LUMBAR
FINDINGS: Mixed injection.  Moderate stenosis at L2-L3.  Severe
stenosis at L3-4 and L4-5 with central crowding of the nerve roots.
Anatomic alignment.  No significant nerve root displacement or
stenosis at L5-S1.

Standing flexion/extension views demonstrate no abnormal movement.
Side-to-side bending views demonstrated limited excursion.

Fluoroscopy Time: 3.08 minutes
IMPRESSION: As above.

CT MYELOGRAPHY LUMBAR SPINE
FINDINGS: Mild vascular calcification.  No paravertebral or
retroperitoneal masses.

L1-2: Mild facet arthropathy. No neural encroachment.

L2-3: Calcified central and leftward protrusion.  Moderate facet
arthropathy. Moderate central canal stenosis Left greater than
right L3 nerve root encroachment.  Left-sided foraminal narrowing
could affect the L2 nerve root.

L3-4: Calcified central and rightward protrusion.  Moderate facet
and ligamentum flavum hypertrophy.  Moderate to severe central
canal stenosis.  Bilateral L4 nerve root encroachment. Right
greater than left neural foraminal narrowing could affect both L3
nerve roots. A far lateral calcific protrusion at L3-4 projects
into the right psoas muscle potentially affecting the right L3
nerve root more distally

L4-5: Severe spinal stenosis secondary to a calcified central and
leftward extrusion.  Moderate facet and ligamentum flavum
hypertrophy.  Left greater than right L5 nerve root encroachment in
the canal.  Bilateral neural foraminal narrowing likely affects
both L4 nerve roots. Marked extraforaminal spurring bridges the L4
and L5 vertebral bodies on the right.

L5-S1: Calcified foraminal protrusion on the right.  Moderate facet
arthropathy.  Right greater than left neural foraminal narrowing
affecting the L5 nerve roots.  No S1 nerve root displacement in the
canal.

Compared with the outside MRI, there is good general agreement.
With regard to the right leg pain, significant right-sided neural
encroachment could occur from L2-S1, but is worst on the right at
L3-4 and L5-S1, but is also severe at L4-5.
IMPRESSION: Mixed injection with subarachnoid and subdural contrast as
described above.

Severe spinal stenosis at L4-5 secondary to calcified central and
leftward extrusion, bounded by facet and ligamentum flavum
hypertrophy.

Moderate to severe spinal stenosis at L3-L4 secondary to a
calcified central and rightward protrusion along with facet and
ligament flavum hypertrophy.

Moderate spinal stenosis at L2-L3 with a calcified central and
leftward protrusion along with moderate facet arthropathy.

Severe right-sided foraminal narrowing at L5-S1 due to a calcified
protrusion and facet overgrowth.

## 2013-05-07 ENCOUNTER — Other Ambulatory Visit (INDEPENDENT_AMBULATORY_CARE_PROVIDER_SITE_OTHER): Payer: 59

## 2013-05-07 DIAGNOSIS — E119 Type 2 diabetes mellitus without complications: Secondary | ICD-10-CM

## 2013-05-07 LAB — LDL CHOLESTEROL, DIRECT: Direct LDL: 99.8 mg/dL

## 2013-05-07 LAB — COMPREHENSIVE METABOLIC PANEL
BUN: 11 mg/dL (ref 6–23)
CO2: 24 mEq/L (ref 19–32)
Creatinine, Ser: 0.8 mg/dL (ref 0.4–1.5)
GFR: 107.57 mL/min (ref >60.00)
Glucose, Bld: 285 mg/dL — ABNORMAL HIGH (ref 70–99)
Total Bilirubin: 0.6 mg/dL (ref 0.3–1.2)
Total Protein: 7.3 g/dL (ref 6.0–8.3)

## 2013-05-07 LAB — LIPID PANEL
Cholesterol: 201 mg/dL — ABNORMAL HIGH (ref 0–200)
HDL: 33.8 mg/dL — ABNORMAL LOW (ref >39.00)
Total CHOL/HDL Ratio: 6
Triglycerides: 434 mg/dL — ABNORMAL HIGH (ref 0.0–149.0)
VLDL: 86.8 mg/dL — ABNORMAL HIGH (ref 0.0–40.0)

## 2013-05-07 LAB — MICROALBUMIN / CREATININE URINE RATIO
Creatinine,U: 193.9 mg/dL
Microalb Creat Ratio: 5.8 mg/g (ref 0.0–30.0)
Microalb, Ur: 11.3 mg/dL — ABNORMAL HIGH (ref 0.0–1.9)

## 2013-05-07 LAB — HEMOGLOBIN A1C: Hgb A1c MFr Bld: 12 % — ABNORMAL HIGH (ref 4.6–6.5)

## 2013-05-09 ENCOUNTER — Encounter: Payer: Self-pay | Admitting: Internal Medicine

## 2013-05-11 NOTE — Telephone Encounter (Signed)
Patient notified of email and that he should keep log of blood sugars and advised patient to bring glucometer since he has not been keeping a log of his blood sugars, patient stated he will also check Email.

## 2013-05-14 ENCOUNTER — Encounter: Payer: Self-pay | Admitting: Internal Medicine

## 2013-05-14 ENCOUNTER — Ambulatory Visit (INDEPENDENT_AMBULATORY_CARE_PROVIDER_SITE_OTHER): Payer: 59 | Admitting: Internal Medicine

## 2013-05-14 VITALS — BP 142/90 | HR 87 | Temp 97.8°F | Resp 12 | Ht 71.0 in | Wt 329.5 lb

## 2013-05-14 DIAGNOSIS — E785 Hyperlipidemia, unspecified: Secondary | ICD-10-CM

## 2013-05-14 DIAGNOSIS — Z6835 Body mass index (BMI) 35.0-35.9, adult: Secondary | ICD-10-CM

## 2013-05-14 DIAGNOSIS — Z0000 Encounter for general adult medical examination without abnormal findings: Secondary | ICD-10-CM

## 2013-05-14 DIAGNOSIS — IMO0002 Reserved for concepts with insufficient information to code with codable children: Secondary | ICD-10-CM

## 2013-05-14 DIAGNOSIS — R030 Elevated blood-pressure reading, without diagnosis of hypertension: Secondary | ICD-10-CM

## 2013-05-14 DIAGNOSIS — G4733 Obstructive sleep apnea (adult) (pediatric): Secondary | ICD-10-CM | POA: Insufficient documentation

## 2013-05-14 DIAGNOSIS — IMO0001 Reserved for inherently not codable concepts without codable children: Secondary | ICD-10-CM

## 2013-05-14 DIAGNOSIS — E1165 Type 2 diabetes mellitus with hyperglycemia: Secondary | ICD-10-CM

## 2013-05-14 LAB — HM DIABETES FOOT EXAM: HM Diabetic Foot Exam: NORMAL

## 2013-05-14 MED ORDER — INSULIN DETEMIR 100 UNIT/ML ~~LOC~~ SOLN: 20.0000 [IU] | Freq: Every day | SUBCUTANEOUS | Status: DC

## 2013-05-14 MED ORDER — INSULIN PEN NEEDLE 32G X 6 MM MISC: Status: AC

## 2013-05-14 NOTE — Patient Instructions (Addendum)
Your diabetes is under poor control on current regimen,   And your sugars will not respond to yoru pills until we get them down to 200 or so.  We are starting you on Levemir 20 units daily   Please check your blood sugars twice daily:   I am interested in fastings and 2 hour post prandials (after a meal) along with a brief description of the meal content related to the particular blood sugars.   If your fasting remains > 200 after one week increase the levemir to 25 units daily    This is my version of a  "Low GI"  Diet:  It will still lower your blood sugars and allow you to lose 4 to 8  lbs  per month if you follow it carefully.  Your goal with exercise is a minimum of 30 minutes of aerobic exercise 5 days per week (Walking does not count once it becomes easy!)    All of the foods can be found at grocery stores and in bulk at [employer].  The Atkins protein bars and shakes are available in more varieties at Target, WalMart and [employer].     7 AM Breakfast:  Choose from the following:  Low carbohydrate Protein  Shakes (I recommend the [employer]AS AdvantEdge "Carb Control" shakes  Or the low carb shakes by Atkins.    2.5 carbs   Arnold's "Sandwhich Thin"toasted  w/ peanut butter (no jelly: about 20 net carbs  "Bagel Thin" with cream cheese and salmon: about 20 carbs   a scrambled egg/bacon/cheese burrito made with Mission's "carb balance" whole wheat tortilla  (about 10 net carbs )   Avoid cereal and bananas, oatmeal and cream of wheat and grits. They are loaded with carbohydrates!   10 AM: high protein snack  Protein bar by Atkins (the snack size, under 200 cal, usually < 6 net carbs).    A stick of cheese:  Around 1 carb,  100 cal     Dannon Light n Fit [place] Yogurt  (80 cal, 8 carbs)  Other so called "protein bars" and Greek yogurts tend to be loaded with carbohydrates.  Remember, in food advertising, the word "energy" is synonymous for " carbohydrate."  Lunch:   A Sandwich using the  bread choices listed, Can use any  Eggs,  lunchmeat, grilled meat or canned tuna), avocado, regular mayo/mustard  and cheese.  A Salad using blue cheese, ranch,  Goddess or vinagrette,  No croutons or "confetti" and no "candied nuts" but regular nuts OK.   No pretzels or chips.  Pickles and miniature sweet peppers are a good low carb alternative that provide a "crunch"  The bread is the only source of carbohydrate in a sandwich and  can be decreased by trying some of these alternatives to traditional loaf bread  Joseph's makes a pita bread and a flat bread that are 50 cal and 4 net carbs available at BJs and WalMart.  This can be toasted to use with hummous as well  Toufayan makes a low carb flatbread that's 100 cal and 9 net carbs available at [employer] and [employer] makes 2 sizes of  Low carb whole wheat tortilla  (The large one is 210 cal and 6 net carbs) Avoid "Low fat dressings, as well as Reyne Dumas and [address] dressings They are loaded with sugar!   3 PM/ Mid day  Snack:  Consider  1 ounce of  almonds, walnuts, pistachios, pecans, peanuts,  Macadamia nuts or a nut medley.  Avoid "granola"; the dried cranberries and raisins are loaded with carbohydrates. Mixed nuts as long as there are no raisins,  cranberries or dried fruit.     6 PM  Dinner:     Meat/fowl/fish with a green salad, and either broccoli, cauliflower, green beans, spinach, brussel sprouts or  Lima beans. DO NOT BREAD THE PROTEIN!!      There is a low carb pasta by Dreamfield's that is acceptable and tastes great: only 5 digestible carbs/serving.( All grocery stores but BJs carry it )  Try Kai Levins Angelo's chicken piccata or chicken or eggplant parm over low carb pasta.(Lowes and BJs)   Clifton Custard Sanchez's "Carnitas" (pulled pork, no sauce,  0 carbs) or his beef pot roast to make a dinner burrito (at BJ's)  Pesto over low carb pasta (bj's sells a good quality pesto in the center refrigerated section of the deli   Whole  wheat pasta is still full of digestible carbs and  Not as low in glycemic index as Dreamfield's.   Brown rice is still rice,  So skip the rice and noodles if you eat [place] or [place] (or at least limit to 1/2 cup)  9 PM snack :   Breyer's "low carb" fudgsicle or  ice cream bar (Carb Smart line), or  Weight Watcher's ice cream bar , or another "no sugar added" ice cream;  a serving of fresh berries/cherries with whipped cream   Cheese or DANNON'S LlGHT N FIT GREEK YOGURT  Avoid bananas, pineapple, grapes  and watermelon on a regular basis because they are high in sugar.  THINK OF THEM AS DESSERT  Remember that snack Substitutions should be less than 10 NET carbs per serving and meals < 20 carbs. Remember to subtract fiber grams to get the "net carbs."  If you eat out:   Choose steamed vegetables instead of potatoes Defer dessert:  Come home and eat the key lime pie flavor  dannon lt n fit greek yogurt Ask them to leave the bread basket. Order the platters at [employer] and [employer] If you  Eat chinese ,   Skip the rice and the sweet and sour  Dishes.  Avoid the heavy sauces

## 2013-05-14 NOTE — Progress Notes (Signed)
Patient ID: Connor Ross, male   DOB: 11/25/1965, 47 y.o.   MRN: [id number]  comprehensive exam and follow up on chronic medical conditions including DM Type 2, hyperlipidemia and morbid obesity. Patient's last OV was  May 2013 . On October 16 2011 he had a back injury at work and wasfound to have 3 herniated disks in his  lumbar spine,  Saw 3 different MDs for opinions on treatment and because it was a Workmen's compensation  Issue he was nt offered surgery.  Did PT for months,  No improvement.  Refused to use narcotics.  Has been taking. Using ibuprofen and essential oils. Lost his health insurance in april 2014.  He recently got married and has enrolled on his wife [employer] just recently     DM: not checking his sugars.  Eating out a lot.  Not exercising due to back pain, No numbness or tingling in feet.  Diet reviewed  A1c reviewed .  Patient given a glucometer and instructed on how to use it.   The patient is here for annual Medicare wellness examination and management of other chronic and acute problems.   The risk factors are reflected in the social history.  The roster of all physicians providing medical care to patient - is listed in the Snapshot section of the chart.  Activities of daily living:  The patient is 100% independent in all ADLs: dressing, toileting, feeding as well as independent mobility  Home safety : The patient has smoke detectors in the home. They wear seatbelts.  There are no firearms at home. There is no violence in the home.   There is no risks for hepatitis, STDs or HIV. There is no   history of blood transfusion. They have no travel history to infectious disease endemic areas of the world.  The patient has not seen their dentist in the last six month. They have not seen their eye doctor in the last year.  Discussed the need for sun protection: hats, long sleeves and use of sunscreen if there is significant sun exposure.   Diet: the importance of a  carbohydrate restricted  diet is discussed. They do not have a low glycemic index  diet.  The benefits of regular aerobic exercise were discussed. She walks 4 times per week ,  20 minutes.   Depression screen: there are no signs or vegative symptoms of depression- irritability, change in appetite, anhedonia, sadness/tearfullness.  Cognitive assessment: the patient manages all their financial and personal affairs and is actively engaged. They could relate day,date,year and events; recalled 2/3 objects at 3 minutes; performed clock-face test normally.  The following portions of the patient's history were reviewed and updated as appropriate: allergies, current medications, past family history, past medical history,  past surgical history, past social history  and problem list.  Visual acuity was not assessed per patient preference since he is scheduled to have follow up with his ophthalmologist. Hearing and body mass index were assessed and reviewed.   During the course of the visit the patient was educated and counseled about appropriate screening and preventive services including : fall prevention , , nutrition counseling,, and recommended immunizations.  He has declined the influenza vaccine and the pneumonia vaccine.    Objective:   BP 142/90  Pulse 87  Temp(Src) 97.8 F (36.6 C) (Oral)  Resp 12  Ht 5\' 11"  (1.803 m)  Wt 329 lb 8 oz (149.46 kg)  BMI 45.98 kg/m2  SpO2 98%  General Appearance:  Alert, cooperative, no distress, appears stated age  Head:    Normocephalic, without obvious abnormality, atraumatic  Eyes:    PERRL, conjunctiva/corneas clear, EOM's intact, fundi    benign, both eyes       Ears:    Normal TM's and external ear canals, both ears  Nose:   Nares normal, septum midline, mucosa normal, no drainage   or sinus tenderness  Throat:   Lips, mucosa, and tongue normal; teeth and gums normal  Neck:   Supple, symmetrical, trachea midline, no adenopathy;       thyroid:   No enlargement/tenderness/nodules; no carotid   bruit or JVD  Back:     Symmetric, no curvature, ROM normal, no CVA tenderness  Lungs:     Clear to auscultation bilaterally, respirations unlabored  Chest wall:    No tenderness or deformity  Heart:    Regular rate and rhythm, S1 and S2 normal, no murmur, rub   or gallop  Abdomen:     Soft, non-tender, bowel sounds active all four quadrants,    no masses, no organomegaly   Foot exam:  Nails are well trimmed,  No callouses,  Sensation intact to microfilament   Extremities:   Extremities normal, atraumatic, no cyanosis or edema  Pulses:   2+ and symmetric all extremities  Skin:   Skin color, texture, turgor normal, no rashes or lesions  Lymph nodes:   Cervical, supraclavicular, and axillary nodes normal  Neurologic:   CNII-XII intact. Normal strength, sensation and reflexes      throughout   Assessment and Plan:  OSA on CPAP     Diagnosed by sleep study doen at [facility]. . She is wearing her CPAP every night a minimum of 6 hours per night.  Needs the mask replaced.  Diabetes mellitus type 2, uncontrolled Secondary to dietary noncomplainace and loss to follow up for over one year.  Taking glipizide and metformin.  Adding Levemir 20 units daily, samples given. Low GI diet handout given,  Strongly advised to have eye exam . Return in 2 to 4 weeks with logs of blood sugar  Hyperlipidemia LDL goal < 70 Will repeat fasting lipids when diabetes is better controlled .  He has refused statin therapy in the past   Morbid obesity with BMI of 45.0-49.9, adult I have addressed  BMI and recommended wt loss of 10% of body weight over the next 6 months using a low glycemic index diet and regular exercise a minimum of 5 days per week.    Elevated blood-pressure reading without diagnosis of hypertension He will need to start ACE Inhibitor at next visit for sysolic > 120 given microalbumin noted on UA today  Encounter for preventive health  examination Annual exam was done excluding testicular and prostate exam.   Updated Medication List Outpatient Encounter Prescriptions as of 05/14/2013  Medication Sig  . glipiZIDE (GLUCOTROL) 10 MG tablet Take 1 tablet (10 mg total) by mouth 2 (two) times daily before a meal.  . Lancets (FREESTYLE) lancets 1 each by Other route 2 (two) times daily. DX 250.02  . metFORMIN (GLUCOPHAGE) 1000 MG tablet Take 1,000 mg by mouth 2 (two) times daily with a meal.  . insulin detemir (LEVEMIR) 100 UNIT/ML injection Inject 0.2 mLs (20 Units total) into the skin at bedtime.  . Insulin Pen Needle 32G X 6 MM MISC Use daily with insulin pen  . [DISCONTINUED] Insulin Pen Needle 31G X 6 MM MISC Use with Levemir once daily -  DX 250.02

## 2013-05-14 NOTE — Assessment & Plan Note (Addendum)
  Diagnosed by sleep study doen at [facility]. . She is wearing her CPAP every night a minimum of 6 hours per night.  Needs the mask replaced.

## 2013-05-16 DIAGNOSIS — I10 Essential (primary) hypertension: Secondary | ICD-10-CM | POA: Insufficient documentation

## 2013-05-16 DIAGNOSIS — Z0000 Encounter for general adult medical examination without abnormal findings: Secondary | ICD-10-CM | POA: Insufficient documentation

## 2013-05-16 NOTE — Assessment & Plan Note (Signed)
Annual exam was done excluding testicular and prostate exam.

## 2013-05-16 NOTE — Assessment & Plan Note (Signed)
I have addressed  BMI and recommended wt loss of 10% of body weight over the next 6 months using a low glycemic index diet and regular exercise a minimum of 5 days per week.   

## 2013-05-16 NOTE — Assessment & Plan Note (Addendum)
Will repeat fasting lipids when diabetes is better controlled .  He has refused statin therapy in the past

## 2013-05-16 NOTE — Assessment & Plan Note (Addendum)
Secondary to dietary noncomplainace and loss to follow up for over one year.  Taking glipizide and metformin.  Adding Levemir 20 units daily, samples given. Low GI diet handout given,  Strongly advised to have eye exam . Return in 2 to 4 weeks with logs of blood sugar

## 2013-05-16 NOTE — Assessment & Plan Note (Signed)
He will need to start ACE Inhibitor at next visit for sysolic > 120 given microalbumin noted on UA today

## 2013-06-04 ENCOUNTER — Telehealth: Payer: Self-pay | Admitting: Internal Medicine

## 2013-06-04 NOTE — Telephone Encounter (Signed)
Patient needs to submit blood sugars as asked at his visit a month ago he can have the injection and we can adjust his levemir dose if they go up

## 2013-06-04 NOTE — Telephone Encounter (Signed)
[facility]/Laurie left vm.  Dr. Ethelene Hal would like to give cervical epidural steroid injection.  Concerned about pt sugar levels.  Asking for approval to give pt injection.  Please contact Jacki Cones [phone]

## 2013-06-09 NOTE — Telephone Encounter (Signed)
Left message with Laurie/ [facility] to call office. Also called patient and left detailed message on answering machine as to needing cbg log and that injection has been approved.

## 2013-06-15 ENCOUNTER — Ambulatory Visit (INDEPENDENT_AMBULATORY_CARE_PROVIDER_SITE_OTHER): Payer: 59 | Admitting: Internal Medicine

## 2013-06-15 ENCOUNTER — Encounter: Payer: Self-pay | Admitting: Internal Medicine

## 2013-06-15 VITALS — BP 126/74 | HR 89 | Temp 98.0°F | Resp 14 | Ht 71.0 in | Wt 326.2 lb

## 2013-06-15 DIAGNOSIS — E1165 Type 2 diabetes mellitus with hyperglycemia: Secondary | ICD-10-CM

## 2013-06-15 DIAGNOSIS — IMO0001 Reserved for inherently not codable concepts without codable children: Secondary | ICD-10-CM

## 2013-06-15 DIAGNOSIS — E785 Hyperlipidemia, unspecified: Secondary | ICD-10-CM

## 2013-06-15 DIAGNOSIS — IMO0002 Reserved for concepts with insufficient information to code with codable children: Secondary | ICD-10-CM

## 2013-06-15 DIAGNOSIS — Z6841 Body Mass Index (BMI) 40.0 and over, adult: Secondary | ICD-10-CM

## 2013-06-15 DIAGNOSIS — I10 Essential (primary) hypertension: Secondary | ICD-10-CM

## 2013-06-15 MED ORDER — INSULIN LISPRO PROT & LISPRO (75-25 MIX) 100 UNIT/ML KWIKPEN: PEN_INJECTOR | SUBCUTANEOUS | Status: DC

## 2013-06-15 MED ORDER — METFORMIN HCL 1000 MG PO TABS: 1000.0000 mg | ORAL_TABLET | Freq: Two times a day (BID) | ORAL | Status: DC

## 2013-06-15 NOTE — Progress Notes (Signed)
Patient ID: Connor Ross, male   DOB: [birth year], 47 y.o.   MRN: [id number]   Patient Active Problem List   Diagnosis Date Noted  . Hypertension 05/16/2013  . Encounter for preventive health examination 05/16/2013  . OSA on CPAP 05/14/2013  . Low back pain radiating to right leg 12/13/2011  . Normal cardiac stress test   . Diabetes mellitus type 2, uncontrolled 08/18/2011  . Hyperlipidemia LDL goal < 70 08/18/2011  . Morbid obesity with BMI of 45.0-49.9, adult 08/18/2011    Subjective:  CC:   Chief Complaint  Patient presents with  . Follow-up    1 month    HPI:   Cranston Reichenbachis a 47 y.o. male who presents 4 week follow up on uncontrolled diabetes.  Patient was started on samples of Levemir insulin (using the flex pen) 20 units daily along with glipizide and metformin one month ago and asked to return with log of blood sugars.   fastings remain n over 200 despite increasing the levemir to  25 units daily.  He is having 3 stools with urgency dauily but does not want to stop the metformin since he works from home.  Does not think he can use a regular syringe for insulin administration.   Has seen [facility] for cervical disk disease and is scheduled to have an epidural cervical spine injection in one week.  Worried that his Bs will sky rocket.    Past Medical History  Diagnosis Date  . Diabetes mellitus   . Hypertension   . Hyperlipidemia   . Normal cardiac stress test 2012    [facility]    Past Surgical History  Procedure Laterality Date  . Lumbar disc surgery         The following portions of the patient's history were reviewed and updated as appropriate: Allergies, current medications, and problem list.    Review of Systems:   12 Pt  review of systems was negative except those addressed in the HPI,     History   Social History  . Marital Status: Legally Separated    Spouse Name: N/A    Number of Children: N/A  . Years of Education: N/A    Occupational History  . Not on file.   Social History Main Topics  . Smoking status: Never Smoker   . Smokeless tobacco: Never Used  . Alcohol Use: No  . Drug Use: No  . Sexual Activity: Not on file   Other Topics Concern  . Not on file   Social History Narrative  . No narrative on file    Objective:  Filed Vitals:   06/15/13 0935  BP: 126/74  Pulse: 89  Temp: 98 F (36.7 C)  Resp: 14     General appearance: alert, cooperative and appears stated age Ears: normal TM's and external ear canals both ears Throat: lips, mucosa, and tongue normal; teeth and gums normal Neck: no adenopathy, no carotid bruit, supple, symmetrical, trachea midline and thyroid not enlarged, symmetric, no tenderness/mass/nodules Back: symmetric, no curvature. ROM normal. No CVA tenderness. Lungs: clear to auscultation bilaterally Heart: regular rate and rhythm, S1, S2 normal, no murmur, click, rub or gallop Abdomen: soft, non-tender; bowel sounds normal; no masses,  no organomegaly Pulses: 2+ and symmetric Skin: Skin color, texture, turgor normal. No rashes or lesions Lymph nodes: Cervical, supraclavicular, and axillary nodes normal.  Assessment and Plan:  Morbid obesity with BMI of 45.0-49.9, adult I have addressed  BMI and recommended wt loss of  10% of body weight over the next 6 months using a low glycemic index diet and regular exercise a minimum of 5 days per week.    Diabetes mellitus type 2, uncontrolled Given his resistance to using traditional syringes for insulin administration I am limmited by what is available as a flex pen.  Fpr nwo will increase the levemir to 30 units daily and increase 5 x 5 days for goal FBS< 150.  Eventually will need 75/25 or 70/30 . Eye exam due,  Foot exam done an unchanged.   Hyperlipidemia LDL goal < 70 His 10 yr risk of CAD is high but he has refused statins   Hypertension Well controlled on current regimen. Renal function stable, no changes  today.  , Lab Results  Component Value Date   CREATININE 0.8 05/07/2013      Updated Medication List Outpatient Encounter Prescriptions as of 06/15/2013  Medication Sig  . glipiZIDE (GLUCOTROL) 10 MG tablet Take 1 tablet (10 mg total) by mouth 2 (two) times daily before a meal.  . insulin detemir (LEVEMIR) 100 UNIT/ML injection Inject 0.2 mLs (20 Units total) into the skin at bedtime.  . Insulin Pen Needle 32G X 6 MM MISC Use daily with insulin pen  . Lancets (FREESTYLE) lancets 1 each by Other route 2 (two) times daily. DX 250.02  . metFORMIN (GLUCOPHAGE) 1000 MG tablet Take 1 tablet (1,000 mg total) by mouth 2 (two) times daily with a meal.  . [DISCONTINUED] metFORMIN (GLUCOPHAGE) 1000 MG tablet Take 1,000 mg by mouth 2 (two) times daily with a meal.  . Insulin Lispro Prot & Lispro (HUMALOG MIX 75/25 KWIKPEN) (75-25) 100 UNIT/ML SUPN 20 before breakfast  And 20 units before dinner     No orders of the defined types were placed in this encounter.    No Follow-up on file.

## 2013-06-15 NOTE — Patient Instructions (Addendum)
I would like to increase the levemir to 30 units daily starting today   E mail me your sugars every two weeks   You may increase the levemir every 5  days by 5 units until your Fasting sugars is < 150  If we get to 50 units and sugars still > 150  We will need to rethink which insulin is best for your case  Check with your insurance about the humalog mix kwikpen  And the novolog mix 70/30 flex pen

## 2013-06-15 NOTE — Assessment & Plan Note (Signed)
Well controlled on current regimen. Renal function stable, no changes today.  , Lab Results  Component Value Date   CREATININE 0.8 05/07/2013

## 2013-06-15 NOTE — Assessment & Plan Note (Signed)
I have addressed  BMI and recommended wt loss of 10% of body weight over the next 6 months using a low glycemic index diet and regular exercise a minimum of 5 days per week.   

## 2013-06-15 NOTE — Assessment & Plan Note (Signed)
Given his resistance to using traditional syringes for insulin administration I am limmited by what is available as a flex pen.  Fpr nwo will increase the levemir to 30 units daily and increase 5 x 5 days for goal FBS< 150.  Eventually will need 75/25 or 70/30 . Eye exam due,  Foot exam done an unchanged.

## 2013-06-15 NOTE — Progress Notes (Signed)
Pre-visit discussion using our clinic review tool. No additional management support is needed unless otherwise documented below in the visit note.  

## 2013-06-15 NOTE — Assessment & Plan Note (Signed)
His 10 yr risk of CAD is high but he has refused statins

## 2013-06-18 ENCOUNTER — Telehealth: Payer: Self-pay | Admitting: Internal Medicine

## 2013-06-18 ENCOUNTER — Other Ambulatory Visit: Payer: Self-pay | Admitting: *Deleted

## 2013-06-18 MED ORDER — GLIPIZIDE 10 MG PO TABS: 10.0000 mg | ORAL_TABLET | Freq: Two times a day (BID) | ORAL | Status: DC

## 2013-06-18 NOTE — Telephone Encounter (Signed)
Pt says his Glipizide was not called into [facility] [address]. He says he usually gets 90 day supply with a couple of refills

## 2013-06-18 NOTE — Telephone Encounter (Signed)
Medication sent electronically 

## 2013-07-01 ENCOUNTER — Encounter: Payer: Self-pay | Admitting: Internal Medicine

## 2013-07-02 ENCOUNTER — Encounter: Payer: Self-pay | Admitting: Internal Medicine

## 2013-07-02 ENCOUNTER — Other Ambulatory Visit: Payer: Self-pay | Admitting: Internal Medicine

## 2013-07-02 MED ORDER — INSULIN LISPRO PROT & LISPRO (75-25 MIX) 100 UNIT/ML KWIKPEN: PEN_INJECTOR | SUBCUTANEOUS | Status: DC

## 2013-07-05 MED ORDER — INSULIN LISPRO PROT & LISPRO (75-25 MIX) 100 UNIT/ML KWIKPEN: PEN_INJECTOR | SUBCUTANEOUS | Status: DC

## 2013-07-05 NOTE — Telephone Encounter (Signed)
Please contact [facility] and find out if a new CPAP mask has been ordered for this patient  ., he needs one.,  thanks

## 2013-07-06 ENCOUNTER — Telehealth: Payer: Self-pay | Admitting: Emergency Medicine

## 2013-07-06 NOTE — Telephone Encounter (Signed)
Pt notified Rx was sent yesterday to [facility], verbalized understanding

## 2013-07-06 NOTE — Telephone Encounter (Signed)
Pt needing script for humalog 75/25. He was only given samples in the office and only have 1/2 vial left.

## 2013-07-22 ENCOUNTER — Telehealth: Payer: Self-pay | Admitting: Internal Medicine

## 2013-07-22 NOTE — Telephone Encounter (Signed)
Please advise. Request in my chart but was it forwarded to you.

## 2013-07-22 NOTE — Telephone Encounter (Signed)
Please refer to the MyChart message from 12.23.14 that the patient sent Dr. [name] about having an order sent to [facility] for his C-Pap machine. The patient called today wanting to know where we were with his order for his C-PaP mask.

## 2013-07-22 NOTE — Telephone Encounter (Signed)
This was supposed to be handled while I was out with my mother.  i see nothing has happened. Sicne I do not have his sleep study report , please call [facility] and find out what needs to be done to get him another CPAP!  Thank you!

## 2013-07-23 NOTE — Telephone Encounter (Signed)
Called [facility] waiting for return call.

## 2013-07-23 NOTE — Telephone Encounter (Signed)
Pt states he has been contacted by [facility].  Pt states [facility] is not in his network since his insurance has changed.  Pt states he has contacted [facility] and they will be able to get his CPAP supplies.  Pt asking if we can fax prescription for CPAP SUPPLIES to [facility] at [phone], Attention Ivin Booty.  Pt asks if we have a copy of his sleep study previously done at [facility] to please fax it as well as [facility] does need a copy.

## 2013-07-23 NOTE — Telephone Encounter (Signed)
This order for 2 new mask and tubing for replacement supplies was faxed to [facility] on 07/07/2013. I have spoken with [facility] they have the signed CMN dated 07/13/13 for him to get his supplies. They are contacting him right now to get him his stuff.

## 2013-07-23 NOTE — Telephone Encounter (Signed)
Pt calling to check status of CPAP mask.  States he has a machine and really needs a mask.  States he has been trying to get this for a month.  Pt is upset.  States he should have a prescription called in for a mask.  States that if he does not hear back this time he is going over our head to speak to someone in charge.

## 2013-07-25 LAB — HM DIABETES EYE EXAM: HM DIABETIC EYE EXAM: NORMAL

## 2013-07-27 NOTE — Telephone Encounter (Signed)
See note below

## 2013-07-27 NOTE — Telephone Encounter (Signed)
Need new order for Cpap patient insurance will not cover [facility] but will cover through [facility].

## 2013-07-27 NOTE — Telephone Encounter (Signed)
It was on a [facility] order form. We need need to obtain new order. I believe we can just fill out a script and fax it to them

## 2013-07-27 NOTE — Telephone Encounter (Signed)
Do we still have copy of everything sent to [facility] or do I need for Dr. [name] to give new order?

## 2013-07-27 NOTE — Telephone Encounter (Signed)
Handwritten order given to [facility]

## 2013-07-28 NOTE — Telephone Encounter (Signed)
Everything has been faxed to [facility] as requested

## 2013-07-28 NOTE — Telephone Encounter (Signed)
Sent over script and office note for patient cpap supplies to [facility] attention Ivin Booty, contacted [facility] for copy of sleep study awaiting results, to fax to [facility].

## 2013-07-29 NOTE — Telephone Encounter (Signed)
Patient notified. All form received and faxed.

## 2013-08-18 ENCOUNTER — Other Ambulatory Visit (INDEPENDENT_AMBULATORY_CARE_PROVIDER_SITE_OTHER): Payer: 59

## 2013-08-18 ENCOUNTER — Telehealth: Payer: Self-pay | Admitting: *Deleted

## 2013-08-18 DIAGNOSIS — IMO0001 Reserved for inherently not codable concepts without codable children: Secondary | ICD-10-CM

## 2013-08-18 DIAGNOSIS — E1165 Type 2 diabetes mellitus with hyperglycemia: Principal | ICD-10-CM

## 2013-08-18 LAB — HEMOGLOBIN A1C: HEMOGLOBIN A1C: 9 % — AB (ref 4.6–6.5)

## 2013-08-18 NOTE — Telephone Encounter (Signed)
What labs and dx?  

## 2013-08-19 ENCOUNTER — Encounter: Payer: Self-pay | Admitting: Internal Medicine

## 2013-08-19 LAB — COMPREHENSIVE METABOLIC PANEL
ALK PHOS: 55 U/L (ref 39–117)
ALT: 30 U/L (ref 0–53)
AST: 15 U/L (ref 0–37)
Albumin: 4.2 g/dL (ref 3.5–5.2)
BUN: 15 mg/dL (ref 6–23)
CALCIUM: 9.7 mg/dL (ref 8.4–10.5)
CO2: 28 mEq/L (ref 19–32)
Chloride: 100 mEq/L (ref 96–112)
Creatinine, Ser: 0.9 mg/dL (ref 0.4–1.5)
GFR: 101.63 mL/min (ref >60.00)
GLUCOSE: 184 mg/dL — AB (ref 70–99)
POTASSIUM: 4.8 meq/L (ref 3.5–5.1)
Sodium: 135 mEq/L (ref 135–145)
TOTAL PROTEIN: 7.2 g/dL (ref 6.0–8.3)
Total Bilirubin: 0.7 mg/dL (ref 0.3–1.2)

## 2013-08-19 LAB — LIPID PANEL
Cholesterol: 229 mg/dL — ABNORMAL HIGH (ref 0–200)
HDL: 31.4 mg/dL — AB (ref >39.00)
TRIGLYCERIDES: 231 mg/dL — AB (ref 0.0–149.0)
Total CHOL/HDL Ratio: 7
VLDL: 46.2 mg/dL — AB (ref 0.0–40.0)

## 2013-08-19 LAB — LDL CHOLESTEROL, DIRECT: LDL DIRECT: 158.9 mg/dL

## 2013-08-25 ENCOUNTER — Ambulatory Visit (INDEPENDENT_AMBULATORY_CARE_PROVIDER_SITE_OTHER): Payer: 59 | Admitting: Internal Medicine

## 2013-08-25 ENCOUNTER — Encounter: Payer: Self-pay | Admitting: Internal Medicine

## 2013-08-25 ENCOUNTER — Telehealth: Payer: Self-pay | Admitting: Internal Medicine

## 2013-08-25 VITALS — BP 138/70 | HR 96 | Temp 98.0°F | Resp 18 | Wt 326.8 lb

## 2013-08-25 DIAGNOSIS — I10 Essential (primary) hypertension: Secondary | ICD-10-CM

## 2013-08-25 DIAGNOSIS — E1165 Type 2 diabetes mellitus with hyperglycemia: Secondary | ICD-10-CM

## 2013-08-25 DIAGNOSIS — E669 Obesity, unspecified: Secondary | ICD-10-CM

## 2013-08-25 DIAGNOSIS — IMO0002 Reserved for concepts with insufficient information to code with codable children: Secondary | ICD-10-CM

## 2013-08-25 DIAGNOSIS — IMO0001 Reserved for inherently not codable concepts without codable children: Secondary | ICD-10-CM

## 2013-08-25 MED ORDER — INSULIN LISPRO PROT & LISPRO (75-25 MIX) 100 UNIT/ML KWIKPEN: PEN_INJECTOR | SUBCUTANEOUS | Status: DC

## 2013-08-25 NOTE — Telephone Encounter (Signed)
Patient came into office after paying his balance of $52.89 and he came back later after his wife told him that their HRA would have paid this amount. I have sent an email to Angelia to send patient the refund back. She said that it will be sent out to the patient in check form. I have gotten an email back from her saying it is complete.

## 2013-08-25 NOTE — Progress Notes (Signed)
Pre-visit discussion using our clinic review tool. No additional management support is needed unless otherwise documented below in the visit note.  

## 2013-08-25 NOTE — Patient Instructions (Addendum)
  Change the humalog insulin dosing and timing as follows   Take 45 units before dinner.  Take 10 units before breakfast  only if you eat   Please check your fasting blood sugars and 2 hr after breakfast for the next week  And e mail me the results so we can adjust the insulin   If you are not hungry for breakfast, drink a protein shake instead

## 2013-08-25 NOTE — Progress Notes (Signed)
Patient ID: Connor Ross, male   DOB: 1965/11/03, 48 y.o.   MRN: [id number]  Patient Active Problem List   Diagnosis Date Noted  . Hypertension 05/16/2013  . Encounter for preventive health examination 05/16/2013  . OSA on CPAP 05/14/2013  . Low back pain radiating to right leg 12/13/2011  . Normal cardiac stress test   . Diabetes mellitus type 2, uncontrolled 08/18/2011  . Hyperlipidemia LDL goal < 70 08/18/2011  . Morbid obesity with BMI of 45.0-49.9, adult 08/18/2011    Subjective:  CC:   Chief Complaint  Patient presents with  . Follow-up  . Diabetes    HPI:   Connor Ross is a 48 y.o. male who presents for  Follow up on uncontrolled diabetes mellitus, obesity and DJD knees.  He has been using humalog 75/25  But has been taking both doses in the evenings *total fo 40 units ) Since Feb 19 due to uncontrolled BS in the morning.  His fastings were ranging 150 to 221 with the 15 unit evening dose,  And once he added the 25  Units dose to the evening, his fastings have improved and are ranging from  180 to 120.   2Pm:  207  12 01 PM 201  133 at 1:45  To 225   4PM 141   Saw ortho about left anterior knee pain.  The pain ocurred while twisting .  Torn cartilage symptoms are iImproving with use of a  Knee brace and daily NSAID.  However , she stopped the med after swelling resolved.  Pain occurs only when stepping a certain way/     Past Medical History  Diagnosis Date  . Diabetes mellitus   . Hypertension   . Hyperlipidemia   . Normal cardiac stress test 2012    [facility]    Past Surgical History  Procedure Laterality Date  . Lumbar disc surgery         The following portions of the patient's history were reviewed and updated as appropriate: Allergies, current medications, and problem list.    Review of Systems:   Patient denies headache, fevers, malaise, unintentional weight loss, skin rash, eye pain, sinus congestion and sinus pain, sore throat,  dysphagia,  hemoptysis , cough, dyspnea, wheezing, chest pain, palpitations, orthopnea, edema, abdominal pain, nausea, melena, diarrhea, constipation, flank pain, dysuria, hematuria, urinary  Frequency, nocturia, numbness, tingling, seizures,  Focal weakness, Loss of consciousness,  Tremor, insomnia, depression, anxiety, and suicidal ideation.     History   Social History  . Marital Status: Legally Separated    Spouse Name: N/A    Number of Children: N/A  . Years of Education: N/A   Occupational History  . Not on file.   Social History Main Topics  . Smoking status: Never Smoker   . Smokeless tobacco: Never Used  . Alcohol Use: No  . Drug Use: No  . Sexual Activity: Not on file   Other Topics Concern  . Not on file   Social History Narrative  . No narrative on file    Objective:  Filed Vitals:   08/25/13 1053  BP: 138/70  Pulse: 96  Temp: 98 F (36.7 C)  Resp: 18     General appearance: alert, cooperative and appears stated age Ears: normal TM's and external ear canals both ears Throat: lips, mucosa, and tongue normal; teeth and gums normal Neck: no adenopathy, no carotid bruit, supple, symmetrical, trachea midline and thyroid not enlarged, symmetric, no tenderness/mass/nodules Back: symmetric, no  curvature. ROM normal. No CVA tenderness. Lungs: clear to auscultation bilaterally Heart: regular rate and rhythm, S1, S2 normal, no murmur, click, rub or gallop Abdomen: soft, non-tender; bowel sounds normal; no masses,  no organomegaly Pulses: 2+ and symmetric Skin: Skin color, texture, turgor normal. No rashes or lesions Lymph nodes: Cervical, supraclavicular, and axillary nodes normal.  Assessment and Plan:  Diabetes mellitus type 2, uncontrolled Improving, but not at goal,  A1c ghas dropped from 12 to 9.  He is  taking insulin incorrectly which will lead to hypoglycemia as control improves.  Advised to take 45 units of insulin pre dinner and 10 units in the  morning  .  Foot exam normal. Eye exam normal.   Obesity I have addressed  BMI and recommended wt loss of 10% of body weigh over the next 6 months using a low glycemic index diet and regular exercise a minimum of 5 days per week.     Hypertension Well controlled on current regimen. Renal function stable, no changes today.   Updated Medication List Outpatient Encounter Prescriptions as of 08/25/2013  Medication Sig  . glipiZIDE (GLUCOTROL) 10 MG tablet Take 1 tablet (10 mg total) by mouth 2 (two) times daily before a meal.  . Insulin Lispro Prot & Lispro (HUMALOG MIX 75/25 KWIKPEN) (75-25) 100 UNIT/ML Kwikpen Inject 45 units in the evening before dinner and 10 units in the AM before breakfast.  Needs 6 pens per month minimum  . Insulin Pen Needle 32G X 6 MM MISC Use daily with insulin pen  . Lancets (FREESTYLE) lancets 1 each by Other route 2 (two) times daily. DX 250.02  . metFORMIN (GLUCOPHAGE) 1000 MG tablet Take 1 tablet (1,000 mg total) by mouth 2 (two) times daily with a meal.  . [DISCONTINUED] Insulin Lispro Prot & Lispro (HUMALOG MIX 75/25 KWIKPEN) (75-25) 100 UNIT/ML SUPN Inject 25 units in the am.  15 units in the PM before meals.  . [DISCONTINUED] Insulin Lispro Prot & Lispro (HUMALOG MIX 75/25 KWIKPEN) (75-25) 100 UNIT/ML SUPN 25 units before breakfast  And 15 units before dinner.  Increase by 3 units every 3 days as needed until BS are < 150     Orders Placed This Encounter  Procedures  . HM DIABETES EYE EXAM    No Follow-up on file.

## 2013-08-26 ENCOUNTER — Encounter: Payer: Self-pay | Admitting: Internal Medicine

## 2013-08-26 NOTE — Assessment & Plan Note (Signed)
Well controlled on current regimen. Renal function stable, no changes today. 

## 2013-08-26 NOTE — Assessment & Plan Note (Addendum)
Improving, but not at goal,  A1c ghas dropped from 12 to 9.  He is  taking insulin incorrectly which will lead to hypoglycemia as control improves.  Advised to take 45 units of insulin pre dinner and 10 units in the morning  .  Foot exam normal. Eye exam normal.

## 2013-08-26 NOTE — Assessment & Plan Note (Signed)
I have addressed  BMI and recommended wt loss of 10% of body weigh over the next 6 months using a low glycemic index diet and regular exercise a minimum of 5 days per week.   

## 2013-10-04 ENCOUNTER — Telehealth: Payer: Self-pay | Admitting: Internal Medicine

## 2013-10-04 NOTE — Telephone Encounter (Signed)
Pt called stating he needed someone to call [facility]  [phone] Reference # [id number] To explain to the the dosage of his humalog  They sent the incorrect amount

## 2013-10-05 NOTE — Telephone Encounter (Signed)
Patient notified pharmacy called and an order placed to pharmacy for ninety day supply.
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
## 2013-11-25 ENCOUNTER — Other Ambulatory Visit: Payer: 59

## 2013-12-02 ENCOUNTER — Ambulatory Visit: Payer: 59 | Admitting: Internal Medicine

## 2014-07-25 ENCOUNTER — Telehealth: Payer: Self-pay | Admitting: *Deleted

## 2014-07-25 ENCOUNTER — Other Ambulatory Visit (INDEPENDENT_AMBULATORY_CARE_PROVIDER_SITE_OTHER): Payer: Commercial Managed Care - PPO

## 2014-07-25 DIAGNOSIS — I10 Essential (primary) hypertension: Secondary | ICD-10-CM

## 2014-07-25 DIAGNOSIS — E785 Hyperlipidemia, unspecified: Secondary | ICD-10-CM

## 2014-07-25 DIAGNOSIS — E1165 Type 2 diabetes mellitus with hyperglycemia: Secondary | ICD-10-CM

## 2014-07-25 DIAGNOSIS — E119 Type 2 diabetes mellitus without complications: Secondary | ICD-10-CM

## 2014-07-25 DIAGNOSIS — IMO0002 Reserved for concepts with insufficient information to code with codable children: Secondary | ICD-10-CM

## 2014-07-25 LAB — COMPREHENSIVE METABOLIC PANEL
ALK PHOS: 69 U/L (ref 39–117)
ALT: 34 U/L (ref 0–53)
AST: 15 U/L (ref 0–37)
Albumin: 4 g/dL (ref 3.5–5.2)
BUN: 13 mg/dL (ref 6–23)
CO2: 28 mEq/L (ref 19–32)
Calcium: 9.3 mg/dL (ref 8.4–10.5)
Chloride: 96 mEq/L (ref 96–112)
Creatinine, Ser: 0.8 mg/dL (ref 0.4–1.5)
GFR: 110.17 mL/min (ref >60.00)
GLUCOSE: 339 mg/dL — AB (ref 70–99)
Potassium: 4.8 mEq/L (ref 3.5–5.1)
Sodium: 131 mEq/L — ABNORMAL LOW (ref 135–145)
TOTAL PROTEIN: 7 g/dL (ref 6.0–8.3)
Total Bilirubin: 0.6 mg/dL (ref 0.2–1.2)

## 2014-07-25 LAB — LIPID PANEL
CHOL/HDL RATIO: 11
Cholesterol: 237 mg/dL — ABNORMAL HIGH (ref 0–200)
HDL: 21.9 mg/dL — ABNORMAL LOW (ref >39.00)
NONHDL: 215.1
Triglycerides: 504 mg/dL — ABNORMAL HIGH (ref 0.0–149.0)
VLDL: 100.8 mg/dL — AB (ref 0.0–40.0)

## 2014-07-25 LAB — LDL CHOLESTEROL, DIRECT: LDL DIRECT: 100.1 mg/dL

## 2014-07-25 LAB — HEMOGLOBIN A1C: Hgb A1c MFr Bld: 12.2 % — ABNORMAL HIGH (ref 4.6–6.5)

## 2014-07-25 NOTE — Telephone Encounter (Signed)
What labs and dx?  

## 2014-07-26 ENCOUNTER — Encounter: Payer: Self-pay | Admitting: Internal Medicine

## 2014-07-27 ENCOUNTER — Encounter: Payer: Self-pay | Admitting: Internal Medicine

## 2014-07-27 ENCOUNTER — Ambulatory Visit (INDEPENDENT_AMBULATORY_CARE_PROVIDER_SITE_OTHER): Payer: 59 | Admitting: Internal Medicine

## 2014-07-27 VITALS — BP 146/82 | HR 91 | Temp 98.5°F | Resp 14 | Ht 71.5 in | Wt 331.2 lb

## 2014-07-27 DIAGNOSIS — E1310 Other specified diabetes mellitus with ketoacidosis without coma: Secondary | ICD-10-CM

## 2014-07-27 DIAGNOSIS — E1165 Type 2 diabetes mellitus with hyperglycemia: Secondary | ICD-10-CM

## 2014-07-27 DIAGNOSIS — Z9989 Dependence on other enabling machines and devices: Secondary | ICD-10-CM

## 2014-07-27 DIAGNOSIS — Z6841 Body Mass Index (BMI) 40.0 and over, adult: Secondary | ICD-10-CM

## 2014-07-27 DIAGNOSIS — IMO0002 Reserved for concepts with insufficient information to code with codable children: Secondary | ICD-10-CM

## 2014-07-27 DIAGNOSIS — G4733 Obstructive sleep apnea (adult) (pediatric): Secondary | ICD-10-CM

## 2014-07-27 DIAGNOSIS — Z0000 Encounter for general adult medical examination without abnormal findings: Secondary | ICD-10-CM

## 2014-07-27 DIAGNOSIS — E1110 Type 2 diabetes mellitus with ketoacidosis without coma: Secondary | ICD-10-CM

## 2014-07-27 DIAGNOSIS — E785 Hyperlipidemia, unspecified: Secondary | ICD-10-CM

## 2014-07-27 DIAGNOSIS — I10 Essential (primary) hypertension: Secondary | ICD-10-CM

## 2014-07-27 LAB — MICROALBUMIN / CREATININE URINE RATIO
CREATININE, U: 76 mg/dL
MICROALB UR: 2.1 mg/dL — AB (ref 0.0–1.9)
MICROALB/CREAT RATIO: 2.8 mg/g (ref 0.0–30.0)

## 2014-07-27 MED ORDER — FENOFIBRATE 145 MG PO TABS: 145.0000 mg | ORAL_TABLET | Freq: Every day | ORAL | Status: DC

## 2014-07-27 MED ORDER — GLUCOSE BLOOD VI STRP: ORAL_STRIP | Status: DC

## 2014-07-27 NOTE — Patient Instructions (Addendum)
Increase you insulin to 20 units in the morning before breakfast,  And continue 45 units in the evening  Increase your evening  Insulin dose by 3 units every 3 days until the morning sugar reading is less than 150    Check with your insurance formulary about the medications:  Januvia Tradjenta Onglyza  We will add one of these   You should limit your ibuprofen to  9 daily ( 600 mg every 8 hours) and Add tylenol 650 mg every 8 hours   Starting fenofibrate for high triglycerides,  Take at bedtime   Please return  For labs and OV in 3 months  This is my version of a  "Low GI"  Diet:  It will still lower your blood sugars and allow you to lose 4 to 8  lbs  per month if you follow it carefully.  Your goal with exercise is a minimum of 30 minutes of aerobic exercise 5 days per week (Walking does not count once it becomes easy!)    All of the foods can be found at grocery stores and in bulk at [employer].  The Atkins protein bars and shakes are available in more varieties at [employer], [employer] and [place].     7 AM Breakfast:  Choose from the following:  Low carbohydrate Protein  Shakes (I recommend the EAS AdvantEdge "Carb Control" shakes  Or the low carb shakes by Atkins.    2.5 carbs   Arnold's "Sandwhich Thin"toasted  w/ peanut butter (no jelly: about 20 net carbs  "Bagel Thin" with cream cheese and salmon: about 20 carbs   a scrambled egg/bacon/cheese burrito made with Mission's "carb balance" whole wheat tortilla  (about 10 net carbs )   Avoid cereal and bananas, oatmeal and cream of wheat and grits. They are loaded with carbohydrates!   10 AM: high protein snack  Protein bar by Atkins (the snack size, under 200 cal, usually < 6 net carbs).    A stick of cheese:  Around 1 carb,  100 cal     Dannon Light n Fit [place] Yogurt  (80 cal, 8 carbs)  Other so called "protein bars" and Greek yogurts tend to be loaded with carbohydrates.  Remember, in food advertising, the word "energy" is  synonymous for " carbohydrate."  Lunch:   A Sandwich using the bread choices listed, Can use any  Eggs,  lunchmeat, grilled meat or canned tuna), avocado, regular mayo/mustard  and cheese.  A Salad using blue cheese, ranch,  Goddess or vinagrette,  No croutons or "confetti" and no "candied nuts" but regular nuts OK.   No pretzels or chips.  Pickles and miniature sweet peppers are a good low carb alternative that provide a "crunch"  The bread is the only source of carbohydrate in a sandwich and  can be decreased by trying some of these alternatives to traditional loaf bread  Joseph's makes a pita bread and a flat bread that are 50 cal and 4 net carbs available at [place] and [place].  This can be toasted to use with hummous as well  Toufayan makes a low carb flatbread that's 100 cal and 9 net carbs available at [employer] and [employer]J's makes 2 sizes of  Low carb whole wheat tortilla  (The large one is 210 cal and 6 net carbs) Avoid "Low fat dressings, as well as [place] dressings They are loaded with sugar!   3 PM/ Mid day  Snack:  Consider  1 ounce of  almonds, walnuts, pistachios, pecans, peanuts,  Macadamia nuts or a nut medley.  Avoid "granola"; the dried cranberries and raisins are loaded with carbohydrates. Mixed nuts as long as there are no raisins,  cranberries or dried fruit.    Try the prosciutto/mozzarella cheese sticks by Fiorruci  In deli /backery section   High protein      6 PM  Dinner:     Meat/fowl/fish with a green salad, and either broccoli, cauliflower, green beans, spinach, brussel sprouts or  Lima beans. DO NOT BREAD THE PROTEIN!!      There is a low carb pasta by Dreamfield's that is acceptable and tastes great: only 5 digestible carbs/serving.( All grocery stores but BJs carry it )  Try [place]'s chicken piccata or chicken or eggplant parm over low carb pasta.(Lowes and BJs)   Marjory Lies Sanchez's "Carnitas" (pulled pork, no sauce,  0 carbs) or his  beef pot roast to make a dinner burrito (at BJ's)  Pesto over low carb pasta (bj's sells a good quality pesto in the center refrigerated section of the deli   Try satueeing  Cheral Marker with mushroooms  Whole wheat pasta is still full of digestible carbs and  Not as low in glycemic index as Dreamfield's.   Brown rice is still rice,  So skip the rice and noodles if you eat [place] or [place] (or at least limit to 1/2 cup)  9 PM snack :   Breyer's "low carb" fudgsicle or  ice cream bar (Carb Smart line), or  Weight Watcher's ice cream bar , or another "no sugar added" ice cream;  a serving of fresh berries/cherries with whipped cream   Cheese or DANNON'S LlGHT N FIT [place] YOGURT  8 ounces of [place] unsweetened almond/cococunut milk    Avoid bananas, pineapple, grapes  and watermelon on a regular basis because they are high in sugar.  THINK OF THEM AS DESSERT  Remember that snack Substitutions should be less than 10 NET carbs per serving and meals < 20 carbs. Remember to subtract fiber grams to get the "net carbs."  Health Maintenance A healthy lifestyle and preventative care can promote health and wellness.  Maintain regular health, dental, and eye exams.  Eat a healthy diet. Foods like vegetables, fruits, whole grains, low-fat dairy products, and lean protein foods contain the nutrients you need and are low in calories. Decrease your intake of foods high in solid fats, added sugars, and salt. Get information about a proper diet from your health care provider, if necessary.  Regular physical exercise is one of the most important things you can do for your health. Most adults should get at least 150 minutes of moderate-intensity exercise (any activity that increases your heart rate and causes you to sweat) each week. In addition, most adults need muscle-strengthening exercises on 2 or more days a week.   Maintain a healthy weight. The body mass index (BMI) is a screening tool to identify  possible weight problems. It provides an estimate of body fat based on height and weight. Your health care provider can find your BMI and can help you achieve or maintain a healthy weight. For males 20 years and older:  A BMI below 18.5 is considered underweight.  A BMI of 18.5 to 24.9 is normal.  A BMI of 25 to 29.9 is considered overweight.  A BMI of 30 and above is considered obese.  Maintain normal blood lipids and cholesterol  by exercising and minimizing your intake of saturated fat. Eat a balanced diet with plenty of fruits and vegetables. Blood tests for lipids and cholesterol should begin at age 52 and be repeated every 5 years. If your lipid or cholesterol levels are high, you are over age 65, or you are at high risk for heart disease, you may need your cholesterol levels checked more frequently.Ongoing high lipid and cholesterol levels should be treated with medicines if diet and exercise are not working.  If you smoke, find out from your health care provider how to quit. If you do not use tobacco, do not start.  Lung cancer screening is recommended for adults aged 61-80 years who are at high risk for developing lung cancer because of a history of smoking. A yearly low-dose CT scan of the lungs is recommended for people who have at least a 30-pack-year history of smoking and are current smokers or have quit within the past 15 years. A pack year of smoking is smoking an average of 1 pack of cigarettes a day for 1 year (for example, a 30-pack-year history of smoking could mean smoking 1 pack a day for 30 years or 2 packs a day for 15 years). Yearly screening should continue until the smoker has stopped smoking for at least 15 years. Yearly screening should be stopped for people who develop a health problem that would prevent them from having lung cancer treatment.  If you choose to drink alcohol, do not have more than 2 drinks per day. One drink is considered to be 12 oz (360 mL) of beer, 5  oz (150 mL) of wine, or 1.5 oz (45 mL) of liquor.  Avoid the use of street drugs. Do not share needles with anyone. Ask for help if you need support or instructions about stopping the use of drugs.  High blood pressure causes heart disease and increases the risk of stroke. Blood pressure should be checked at least every 1-2 years. Ongoing high blood pressure should be treated with medicines if weight loss and exercise are not effective.  If you are 16-38 years old, ask your health care provider if you should take aspirin to prevent heart disease.  Diabetes screening involves taking a blood sample to check your fasting blood sugar level. This should be done once every 3 years after age 59 if you are at a normal weight and without risk factors for diabetes. Testing should be considered at a younger age or be carried out more frequently if you are overweight and have at least 1 risk factor for diabetes.  Colorectal cancer can be detected and often prevented. Most routine colorectal cancer screening begins at the age of 56 and continues through age 23. However, your health care provider may recommend screening at an earlier age if you have risk factors for colon cancer. On a yearly basis, your health care provider may provide home test kits to check for hidden blood in the stool. A small camera at the end of a tube may be used to directly examine the colon (sigmoidoscopy or colonoscopy) to detect the earliest forms of colorectal cancer. Talk to your health care provider about this at age 54 when routine screening begins. A direct exam of the colon should be repeated every 5-10 years through age 43, unless early forms of precancerous polyps or small growths are found.  People who are at an increased risk for hepatitis B should be screened for this virus. You are considered at high  risk for hepatitis B if:  You were born in a country where hepatitis B occurs often. Talk with your health care provider about  which countries are considered high risk.  Your parents were born in a high-risk country and you have not received a shot to protect against hepatitis B (hepatitis B vaccine).  You have HIV or AIDS.  You use needles to inject street drugs.  You live with, or have sex with, someone who has hepatitis B.  You are a man who has sex with other men (MSM).  You get hemodialysis treatment.  You take certain medicines for conditions like cancer, organ transplantation, and autoimmune conditions.  Hepatitis C blood testing is recommended for all people born from [birth year] through 1967 and any individual with known risk factors for hepatitis C.  Healthy men should no longer receive prostate-specific antigen (PSA) blood tests as part of routine cancer screening. Talk to your health care provider about prostate cancer screening.  Testicular cancer screening is not recommended for adolescents or adult males who have no symptoms. Screening includes self-exam, a health care provider exam, and other screening tests. Consult with your health care provider about any symptoms you have or any concerns you have about testicular cancer.  Practice safe sex. Use condoms and avoid high-risk sexual practices to reduce the spread of sexually transmitted infections (STIs).  You should be screened for STIs, including gonorrhea and chlamydia if:  You are sexually active and are younger than 24 years.  You are older than 24 years, and your health care provider tells you that you are at risk for this type of infection.  Your sexual activity has changed since you were last screened, and you are at an increased risk for chlamydia or gonorrhea. Ask your health care provider if you are at risk.  If you are at risk of being infected with HIV, it is recommended that you take a prescription medicine daily to prevent HIV infection. This is called pre-exposure prophylaxis (PrEP). You are considered at risk if:  You are a man who  has sex with other men (MSM).  You are a heterosexual man who is sexually active with multiple partners.  You take drugs by injection.  You are sexually active with a partner who has HIV.  Talk with your health care provider about whether you are at high risk of being infected with HIV. If you choose to begin PrEP, you should first be tested for HIV. You should then be tested every 3 months for as long as you are taking PrEP.  Use sunscreen. Apply sunscreen liberally and repeatedly throughout the day. You should seek shade when your shadow is shorter than you. Protect yourself by wearing long sleeves, pants, a wide-brimmed hat, and sunglasses year round whenever you are outdoors.  Tell your health care provider of new moles or changes in moles, especially if there is a change in shape or color. Also, tell your health care provider if a mole is larger than the size of a pencil eraser.  A one-time screening for abdominal aortic aneurysm (AAA) and surgical repair of large AAAs by ultrasound is recommended for men aged 47-75 years who are current or former smokers.  Stay current with your vaccines (immunizations). Document Released: 12/28/2007 Document Revised: 07/06/2013 Document Reviewed: 11/26/2010 [facility] Patient Information 2015 [place], [place]. This information is not intended to replace advice given to you by your health care provider. Make sure you discuss any questions you have with your health  care provider.

## 2014-07-27 NOTE — Progress Notes (Signed)
Patient ID: Connor Ross, male   DOB: 1966-01-10, 49 y.o.   MRN: [id number]  Patient Active Problem List   Diagnosis Date Noted  . Hypertension 05/16/2013  . Encounter for preventive health examination 05/16/2013  . OSA on CPAP 05/14/2013  . Low back pain radiating to right leg 12/13/2011  . Normal cardiac stress test   . Diabetes mellitus type 2, uncontrolled 08/18/2011  . Hyperlipidemia with target LDL less than 70 08/18/2011  . Morbid obesity with BMI of 45.0-49.9, adult 08/18/2011    Subjective:  CC:   Chief Complaint  Patient presents with  . Annual Exam    HPI:   Connor Ross is a 49 y.o. male who presents for  His annual  exam along with follow up on uncontrolled DM, and has not been seen in over 6 months   He is Using humalog  Insulin  45 units  in the evening and 10 units in the morning   Using the pens .  Cannot afford Januvia, tradjenta, or onglyza.,  Will not take metformin due to recurrent uncontrollable diarrhea.  He has been turned down for disability more than once, he has been told he is capable of doing other things,  He has not tried to find other employment ecause of his herniated disks and is frustrated that he is " a burden to my wife"  He has gained weight, he is not exercising.    Past Medical History  Diagnosis Date  . Diabetes mellitus   . Hypertension   . Hyperlipidemia   . Normal cardiac stress test 2012    [facility]    Past Surgical History  Procedure Laterality Date  . Lumbar disc surgery         The following portions of the patient's history were reviewed and updated as appropriate: Allergies, current medications, and problem list.    Review of Systems:   Patient denies headache, fevers, malaise, unintentional weight loss, skin rash, eye pain, sinus congestion and sinus pain, sore throat, dysphagia,  hemoptysis , cough, dyspnea, wheezing, chest pain, palpitations, orthopnea, edema, abdominal pain, nausea, melena,  diarrhea, constipation, flank pain, dysuria, hematuria, urinary  Frequency, nocturia, numbness, tingling, seizures,  Focal weakness, Loss of consciousness,  Tremor, insomnia, depression, anxiety, and suicidal ideation.     History   Social History  . Marital Status: Legally Separated    Spouse Name: N/A    Number of Children: N/A  . Years of Education: N/A   Occupational History  . Not on file.   Social History Main Topics  . Smoking status: Never Smoker   . Smokeless tobacco: Never Used  . Alcohol Use: No  . Drug Use: No  . Sexual Activity: Not on file   Other Topics Concern  . Not on file   Social History Narrative    Objective:  Filed Vitals:   07/27/14 1016  BP: 146/82  Pulse: 91  Temp: 98.5 F (36.9 C)  Resp: 14     General appearance: alert, cooperative and appears stated age Ears: normal TM's and external ear canals both ears Throat: lips, mucosa, and tongue normal; teeth and gums normal Neck: no adenopathy, no carotid bruit, supple, symmetrical, trachea midline and thyroid not enlarged, symmetric, no tenderness/mass/nodules Back: symmetric, no curvature. ROM normal. No CVA tenderness. Lungs: clear to auscultation bilaterally Heart: regular rate and rhythm, S1, S2 normal, no murmur, click, rub or gallop Abdomen: soft, non-tender; bowel sounds normal; no masses,  no organomegaly Pulses:  2+ and symmetric Skin: Skin color, texture, turgor normal. No rashes or lesions Lymph nodes: Cervical, supraclavicular, and axillary nodes normal.  Assessment and Plan:  Problem List Items Addressed This Visit    Diabetes mellitus type 2, uncontrolled    Uncontrolled despite switching from basal insulin to to 75/25 insulin and titration of doses,  nincreasing dose to 2 units in the am and 45 i the evening .  Foot exam normal.  Lab Results  Component Value Date   HGBA1C 12.2* 07/25/2014   Lab Results  Component Value Date   MICROALBUR 2.1* 07/27/2014         Relevant Medications   lisinopril (PRINIVIL,ZESTRIL) tablet   Encounter for annual physical exam    Annual wellness  exam was done as well as a comprehensive physical exam and management of acute and chronic conditions .  During the course of the visit the patient was educated and counseled about appropriate screening and preventive services including : fall prevention , diabetes screening, nutrition counseling, colorectal cancer screening, and recommended immunizations.  Printed recommendations for health maintenance screenings was given.       Hyperlipidemia with target LDL less than 70    Starting fenofibrate for elevated triglycerides.  Lab Results  Component Value Date   CHOL 237* 07/25/2014   HDL 21.90* 07/25/2014   LDLCALC 128* 05/17/2011   LDLDIRECT 100.1 07/25/2014   TRIG 504.0* 07/25/2014   CHOLHDL 11 07/25/2014         Relevant Medications   lisinopril (PRINIVIL,ZESTRIL) tablet   Hypertension    Starting lisinopril today.   Lab Results  Component Value Date   CREATININE 0.8 07/25/2014   Lab Results  Component Value Date   NA 131* 07/25/2014   K 4.8 07/25/2014   CL 96 07/25/2014   CO2 28 07/25/2014         Relevant Medications   lisinopril (PRINIVIL,ZESTRIL) tablet   Morbid obesity with BMI of 45.0-49.9, adult    I have addressed  BMI and recommended a low glycemic index diet utilizing smaller more frequent meals to increase metabolism.  I have also recommended that patient start exercising with a goal of 30 minutes of aerobic exercise a minimum of 5 days per week.       OSA on CPAP    Diagnosed by sleep study. She is wearing her CPAP every night a minimum of 6 hours per night and notes improved daytime wakefulness and decreased fatigue        Other Visit Diagnoses    DM (diabetes mellitus) type 2, uncontrolled, with ketoacidosis    -  Primary    Relevant Medications    lisinopril (PRINIVIL,ZESTRIL) tablet    Other Relevant Orders    Microalbumin /  creatinine urine ratio (Completed)        Orders Placed This Encounter  Procedures  . Microalbumin / creatinine urine ratio    No Follow-up on file.

## 2014-07-28 ENCOUNTER — Telehealth: Payer: Self-pay | Admitting: Internal Medicine

## 2014-07-28 MED ORDER — FENOFIBRATE 160 MG PO TABS: 160.0000 mg | ORAL_TABLET | Freq: Every day | ORAL | Status: DC

## 2014-07-28 NOTE — Telephone Encounter (Addendum)
lofibra 160 mg is $ 35 [facility], fenofibrate 145 mg not covered under insurance, Nor is the Januvia,  Onglyza . Or the trajenta  . Patient stated the only one he can afford is the [place].

## 2014-07-28 NOTE — Telephone Encounter (Signed)
lofibra sent to [facility].  We will use insulin only then to get DM under control. But he will need to use Mychart to send me blood sugar readings once every 2 weeks and follow up in 3 months

## 2014-07-28 NOTE — Telephone Encounter (Signed)
Left detailed message per patient DPR on voicemail.

## 2014-07-30 ENCOUNTER — Encounter: Payer: Self-pay | Admitting: Internal Medicine

## 2014-07-30 DIAGNOSIS — Z1211 Encounter for screening for malignant neoplasm of colon: Secondary | ICD-10-CM | POA: Insufficient documentation

## 2014-07-30 MED ORDER — LISINOPRIL 10 MG PO TABS: 10.0000 mg | ORAL_TABLET | Freq: Every day | ORAL | Status: DC

## 2014-07-30 NOTE — Assessment & Plan Note (Signed)
Starting fenofibrate for elevated triglycerides.  Lab Results  Component Value Date   CHOL 237* 07/25/2014   HDL 21.90* 07/25/2014   LDLCALC 128* 05/17/2011   LDLDIRECT 100.1 07/25/2014   TRIG 504.0* 07/25/2014   CHOLHDL 11 07/25/2014

## 2014-07-30 NOTE — Assessment & Plan Note (Addendum)
Uncontrolled despite switching from basal insulin to to 75/25 insulin and titration of doses,  nincreasing dose to 2 units in the am and 45 i the evening .  Foot exam normal.  Lab Results  Component Value Date   HGBA1C 12.2* 07/25/2014   Lab Results  Component Value Date   MICROALBUR 2.1* 07/27/2014

## 2014-07-30 NOTE — Assessment & Plan Note (Addendum)
Starting lisinopril today.   Lab Results  Component Value Date   CREATININE 0.8 07/25/2014   Lab Results  Component Value Date   NA 131* 07/25/2014   K 4.8 07/25/2014   CL 96 07/25/2014   CO2 28 07/25/2014

## 2014-07-30 NOTE — Assessment & Plan Note (Signed)
I have addressed  BMI and recommended a low glycemic index diet utilizing smaller more frequent meals to increase metabolism.  I have also recommended that patient start exercising with a goal of 30 minutes of aerobic exercise a minimum of 5 days per week.  

## 2014-07-30 NOTE — Assessment & Plan Note (Signed)
Diagnosed by sleep study. She is wearing her CPAP every night a minimum of 6 hours per night and notes improved daytime wakefulness and decreased fatigue  

## 2014-07-30 NOTE — Assessment & Plan Note (Signed)
Annual  wellness  exam was done as well as a comprehensive physical exam and management of acute and chronic conditions .  During the course of the visit the patient was educated and counseled about appropriate screening and preventive services including : fall prevention , diabetes screening, nutrition counseling, colorectal cancer screening, and recommended immunizations.  Printed recommendations for health maintenance screenings was given.  

## 2014-08-14 ENCOUNTER — Other Ambulatory Visit: Payer: Self-pay | Admitting: Internal Medicine

## 2014-08-26 ENCOUNTER — Encounter: Payer: Self-pay | Admitting: Internal Medicine

## 2014-08-26 DIAGNOSIS — E1165 Type 2 diabetes mellitus with hyperglycemia: Secondary | ICD-10-CM

## 2014-08-26 DIAGNOSIS — IMO0002 Reserved for concepts with insufficient information to code with codable children: Secondary | ICD-10-CM

## 2014-08-27 MED ORDER — CANAGLIFLOZIN 100 MG PO TABS: 100.0000 mg | ORAL_TABLET | Freq: Every day | ORAL | Status: DC

## 2014-08-27 MED ORDER — INSULIN LISPRO PROT & LISPRO (75-25 MIX) 100 UNIT/ML KWIKPEN: PEN_INJECTOR | SUBCUTANEOUS | Status: DC

## 2014-08-29 ENCOUNTER — Telehealth: Payer: Self-pay

## 2014-08-29 NOTE — Telephone Encounter (Signed)
Prior Authorization has been started for [employer]. Awaiting a response at this time.

## 2014-08-30 NOTE — Telephone Encounter (Signed)
Fax from [facility], [place] approved through 08/29/15.

## 2014-09-10 LAB — HM DIABETES EYE EXAM

## 2014-09-14 ENCOUNTER — Other Ambulatory Visit: Payer: Self-pay | Admitting: Internal Medicine

## 2014-09-14 MED ORDER — GLIPIZIDE 10 MG PO TABS: 10.0000 mg | ORAL_TABLET | Freq: Two times a day (BID) | ORAL | Status: DC

## 2014-09-14 NOTE — Telephone Encounter (Signed)
Refill for Glipizide, sent to [facility].

## 2014-10-18 ENCOUNTER — Telehealth: Payer: Self-pay | Admitting: *Deleted

## 2014-10-18 ENCOUNTER — Other Ambulatory Visit: Payer: 59

## 2014-10-18 DIAGNOSIS — IMO0002 Reserved for concepts with insufficient information to code with codable children: Secondary | ICD-10-CM

## 2014-10-18 DIAGNOSIS — E1165 Type 2 diabetes mellitus with hyperglycemia: Secondary | ICD-10-CM

## 2014-10-18 NOTE — Telephone Encounter (Signed)
Labs and dX?  

## 2014-10-18 NOTE — Telephone Encounter (Signed)
HE IS TOO EARLY FOR LABS.  April 11TH OR ISNURANCE WON'T PAY.   LABS ORDERED FOR THEN.

## 2014-10-18 NOTE — Telephone Encounter (Signed)
Patient rescheduled for labs and for OV for Diabetic follow up

## 2014-10-20 ENCOUNTER — Ambulatory Visit: Payer: 59 | Admitting: Internal Medicine

## 2014-10-26 ENCOUNTER — Other Ambulatory Visit (INDEPENDENT_AMBULATORY_CARE_PROVIDER_SITE_OTHER): Payer: 59

## 2014-10-26 DIAGNOSIS — IMO0002 Reserved for concepts with insufficient information to code with codable children: Secondary | ICD-10-CM

## 2014-10-26 DIAGNOSIS — E1165 Type 2 diabetes mellitus with hyperglycemia: Secondary | ICD-10-CM | POA: Diagnosis not present

## 2014-10-26 LAB — COMPREHENSIVE METABOLIC PANEL
ALT: 21 U/L (ref 0–53)
AST: 10 U/L (ref 0–37)
Albumin: 4.1 g/dL (ref 3.5–5.2)
Alkaline Phosphatase: 64 U/L (ref 39–117)
BUN: 16 mg/dL (ref 6–23)
CALCIUM: 9.5 mg/dL (ref 8.4–10.5)
CHLORIDE: 100 meq/L (ref 96–112)
CO2: 28 meq/L (ref 19–32)
Creatinine, Ser: 0.86 mg/dL (ref 0.40–1.50)
GFR: 99.79 mL/min (ref >60.00)
Glucose, Bld: 201 mg/dL — ABNORMAL HIGH (ref 70–99)
Potassium: 5 mEq/L (ref 3.5–5.1)
Sodium: 135 mEq/L (ref 135–145)
Total Bilirubin: 0.4 mg/dL (ref 0.2–1.2)
Total Protein: 7 g/dL (ref 6.0–8.3)

## 2014-10-26 LAB — LIPID PANEL
CHOL/HDL RATIO: 9
Cholesterol: 245 mg/dL — ABNORMAL HIGH (ref 0–200)
HDL: 27 mg/dL — AB (ref >39.00)
NonHDL: 218
Triglycerides: 231 mg/dL — ABNORMAL HIGH (ref 0.0–149.0)
VLDL: 46.2 mg/dL — AB (ref 0.0–40.0)

## 2014-10-26 LAB — LDL CHOLESTEROL, DIRECT: Direct LDL: 164 mg/dL

## 2014-10-26 LAB — HEMOGLOBIN A1C: Hgb A1c MFr Bld: 9 % — ABNORMAL HIGH (ref 4.6–6.5)

## 2014-10-28 ENCOUNTER — Encounter: Payer: Self-pay | Admitting: Internal Medicine

## 2014-11-02 ENCOUNTER — Ambulatory Visit (INDEPENDENT_AMBULATORY_CARE_PROVIDER_SITE_OTHER): Payer: 59 | Admitting: Internal Medicine

## 2014-11-02 ENCOUNTER — Encounter: Payer: Self-pay | Admitting: Internal Medicine

## 2014-11-02 VITALS — BP 144/80 | HR 86 | Temp 98.5°F | Resp 16 | Ht 72.0 in | Wt 325.0 lb

## 2014-11-02 DIAGNOSIS — I10 Essential (primary) hypertension: Secondary | ICD-10-CM

## 2014-11-02 DIAGNOSIS — Z6841 Body Mass Index (BMI) 40.0 and over, adult: Secondary | ICD-10-CM

## 2014-11-02 DIAGNOSIS — E1165 Type 2 diabetes mellitus with hyperglycemia: Secondary | ICD-10-CM | POA: Diagnosis not present

## 2014-11-02 DIAGNOSIS — IMO0002 Reserved for concepts with insufficient information to code with codable children: Secondary | ICD-10-CM

## 2014-11-02 NOTE — Patient Instructions (Addendum)
Your goal for FASTING morning sugars is 90 to 120.  Continue 100 units unless you keep dropping   I would resume the lisinopril to protect your kidneys  DO NOT RESUME THE FENOFIBRATE .  WE WILL CALL [facility] AND GET IT STOPPED    Try drinking one of these at night to prevent morning lows:   Almond milk Premier Protein Chocolate shakes

## 2014-11-02 NOTE — Progress Notes (Signed)
Patient ID: Connor Ross, male   DOB: [birth year], 49 y.o.   MRN: [id number]  Patient Active Problem List   Diagnosis Date Noted  . Encounter for annual physical exam 07/30/2014  . Hypertension 05/16/2013  . Encounter for preventive health examination 05/16/2013  . OSA on CPAP 05/14/2013  . Low back pain radiating to right leg 12/13/2011  . Normal cardiac stress test   . Diabetes mellitus type 2, uncontrolled 08/18/2011  . Hyperlipidemia with target LDL less than 70 08/18/2011  . Morbid obesity with BMI of 45.0-49.9, adult 08/18/2011    Subjective:  CC:   Chief Complaint  Patient presents with  . Follow-up    Stopped lisinopril and fenofibrate due to joint pain increase.  . Diabetes    HPI:   Connor Ross is a 49 y.o. male who presents for  Follow up on uncontrolled type 2 DM secondary to being lost to follow up and previous medication noncompliance.  He has been following a low glycemic index diet and taking his medications including insulin.  His recent blood sugars fasting have all  Been < 140 except on rare occasions  He is using 100 units  Of  Humalog 75/25 mixed insulin in the eveing and 20 units in the morning.  He did not tolerate fenofibrate due to muscle and joint pain but also stopped lisinopril bc he wasn't sure which was causing his pain.  He has lost several pounds through diet and walking but is limited by his joint and back pain.    Past Medical History  Diagnosis Date  . Diabetes mellitus   . Hypertension   . Hyperlipidemia   . Normal cardiac stress test 2012    [facility]    Past Surgical History  Procedure Laterality Date  . Lumbar disc surgery         The following portions of the patient's history were reviewed and updated as appropriate: Allergies, current medications, and problem list.    Review of Systems:   Patient denies headache, fevers, malaise, unintentional weight loss, skin rash, eye pain, sinus congestion and sinus  pain, sore throat, dysphagia,  hemoptysis , cough, dyspnea, wheezing, chest pain, palpitations, orthopnea, edema, abdominal pain, nausea, melena, diarrhea, constipation, flank pain, dysuria, hematuria, urinary  Frequency, nocturia, numbness, tingling, seizures,  Focal weakness, Loss of consciousness,  Tremor, insomnia, depression, anxiety, and suicidal ideation.     History   Social History  . Marital Status: Legally Separated    Spouse Name: N/A  . Number of Children: N/A  . Years of Education: N/A   Occupational History  . Not on file.   Social History Main Topics  . Smoking status: Never Smoker   . Smokeless tobacco: Never Used  . Alcohol Use: No  . Drug Use: No  . Sexual Activity: Not on file   Other Topics Concern  . Not on file   Social History Narrative    Objective:  Filed Vitals:   11/02/14 1339  BP: 144/80  Pulse: 86  Temp: 98.5 F (36.9 C)  Resp: 16     General appearance: alert, cooperative and appears stated age Ears: normal TM's and external ear canals both ears Throat: lips, mucosa, and tongue normal; teeth and gums normal Neck: no adenopathy, no carotid bruit, supple, symmetrical, trachea midline and thyroid not enlarged, symmetric, no tenderness/mass/nodules Back: symmetric, no curvature. ROM normal. No CVA tenderness. Lungs: clear to auscultation bilaterally Heart: regular rate and rhythm, S1, S2 normal, no murmur,  click, rub or gallop Abdomen: soft, non-tender; bowel sounds normal; no masses,  no organomegaly Pulses: 2+ and symmetric Skin: Skin color, texture, turgor normal. No rashes or lesions Lymph nodes: Cervical, supraclavicular, and axillary nodes normal.  Assessment and Plan:  Hypertension Advised to resume lisinopril  Lab Results  Component Value Date   CREATININE 0.86 10/26/2014   Lab Results  Component Value Date   NA 135 10/26/2014   K 5.0 10/26/2014   CL 100 10/26/2014   CO2 28 10/26/2014   Lab Results  Component Value  Date   MICROALBUR 2.1* 07/27/2014      Diabetes mellitus type 2, uncontrolled Blood sugars have improved significantly over the past monthly only,  So A1c is not yet at goal.   Advised to continue current regimen and add a  low carb bedtime snack to prevent recurrent hypoglycemic events in the early morning.    Morbid obesity with BMI of 45.0-49.9, adult I have ackowledged his weight loss,  addressed  BMI and recommended wt loss of 10% of body weight over the next 6 months using a low glycemic index diet and regular exercise a minimum of 5 days per week.     A total of 25 minutes of face to face time was spent with patient more than half of which was spent in counselling about the above mentioned conditions  and coordination of care  Updated Medication List Outpatient Encounter Prescriptions as of 11/02/2014  Medication Sig  . glipiZIDE (GLUCOTROL) 10 MG tablet Take 1 tablet (10 mg total) by mouth 2 (two) times daily before a meal.  . glucose blood test strip For use twice daily  For diabetes testing   For uncontrolled diabetes  . Insulin Lispro Prot & Lispro (HUMALOG MIX 75/25 KWIKPEN) (75-25) 100 UNIT/ML Kwikpen 20 units in the am before breakfast and 100 units before dinner  . Insulin Pen Needle 32G X 6 MM MISC Use daily with insulin pen  . Lancets (FREESTYLE) lancets 1 each by Other route 2 (two) times daily. DX 250.02  . lisinopril (PRINIVIL,ZESTRIL) 10 MG tablet Take 1 tablet (10 mg total) by mouth daily. (Patient not taking: Reported on 11/02/2014)  . [DISCONTINUED] canagliflozin (INVOKANA) 100 MG TABS tablet Take 1 tablet (100 mg total) by mouth daily. (Patient not taking: Reported on 11/02/2014)  . [DISCONTINUED] fenofibrate 160 MG tablet Take 1 tablet (160 mg total) by mouth daily. (Patient not taking: Reported on 11/02/2014)     No orders of the defined types were placed in this encounter.    Return in about 3 months (around 02/01/2015).

## 2014-11-02 NOTE — Progress Notes (Signed)
Pre-visit discussion using our clinic review tool. No additional management support is needed unless otherwise documented below in the visit note.  

## 2014-11-05 ENCOUNTER — Encounter: Payer: Self-pay | Admitting: Internal Medicine

## 2014-11-05 NOTE — Assessment & Plan Note (Signed)
Advised to resume lisinopril  Lab Results  Component Value Date   CREATININE 0.86 10/26/2014   Lab Results  Component Value Date   NA 135 10/26/2014   K 5.0 10/26/2014   CL 100 10/26/2014   CO2 28 10/26/2014   Lab Results  Component Value Date   MICROALBUR 2.1* 07/27/2014

## 2014-11-05 NOTE — Assessment & Plan Note (Addendum)
I have ackowledged his weight loss,  addressed  BMI and recommended wt loss of 10% of body weight over the next 6 months using a low glycemic index diet and regular exercise a minimum of 5 days per week.

## 2014-11-05 NOTE — Assessment & Plan Note (Signed)
Blood sugars have improved significantly over the past monthly only,  So A1c is not yet at goal.   Advised to continue current regimen and add a  low carb bedtime snack to prevent recurrent hypoglycemic events in the early morning.

## 2015-01-10 ENCOUNTER — Other Ambulatory Visit: Payer: Self-pay | Admitting: Internal Medicine

## 2015-02-01 ENCOUNTER — Telehealth: Payer: Self-pay | Admitting: Internal Medicine

## 2015-02-01 DIAGNOSIS — E1165 Type 2 diabetes mellitus with hyperglycemia: Secondary | ICD-10-CM

## 2015-02-01 DIAGNOSIS — IMO0002 Reserved for concepts with insufficient information to code with codable children: Secondary | ICD-10-CM

## 2015-02-01 NOTE — Telephone Encounter (Signed)
Coming in Friday what labs?

## 2015-02-03 ENCOUNTER — Other Ambulatory Visit: Payer: 59

## 2015-02-06 ENCOUNTER — Other Ambulatory Visit (INDEPENDENT_AMBULATORY_CARE_PROVIDER_SITE_OTHER): Payer: 59

## 2015-02-06 DIAGNOSIS — I10 Essential (primary) hypertension: Secondary | ICD-10-CM | POA: Diagnosis not present

## 2015-02-06 DIAGNOSIS — E785 Hyperlipidemia, unspecified: Secondary | ICD-10-CM | POA: Diagnosis not present

## 2015-02-06 DIAGNOSIS — E1165 Type 2 diabetes mellitus with hyperglycemia: Secondary | ICD-10-CM

## 2015-02-06 DIAGNOSIS — IMO0002 Reserved for concepts with insufficient information to code with codable children: Secondary | ICD-10-CM

## 2015-02-06 LAB — LIPID PANEL
CHOL/HDL RATIO: 8
Cholesterol: 215 mg/dL — ABNORMAL HIGH (ref 0–200)
HDL: 27.7 mg/dL — ABNORMAL LOW (ref >39.00)
LDL Cholesterol: 150 mg/dL — ABNORMAL HIGH (ref 0–99)
NonHDL: 187.3
Triglycerides: 188 mg/dL — ABNORMAL HIGH (ref 0.0–149.0)
VLDL: 37.6 mg/dL (ref 0.0–40.0)

## 2015-02-06 LAB — CBC WITH DIFFERENTIAL/PLATELET
Basophils Absolute: 0 10*3/uL (ref 0.0–0.1)
Basophils Relative: 0.5 % (ref 0.0–3.0)
EOS ABS: 0.3 10*3/uL (ref 0.0–0.7)
EOS PCT: 3.8 % (ref 0.0–5.0)
HCT: 43.6 % (ref 39.0–52.0)
Hemoglobin: 14.6 g/dL (ref 13.0–17.0)
Lymphocytes Relative: 24.6 % (ref 12.0–46.0)
Lymphs Abs: 1.8 10*3/uL (ref 0.7–4.0)
MCHC: 33.6 g/dL (ref 30.0–36.0)
MCV: 88.2 fl (ref 78.0–100.0)
MONO ABS: 0.8 10*3/uL (ref 0.1–1.0)
MONOS PCT: 11.2 % (ref 3.0–12.0)
Neutro Abs: 4.3 10*3/uL (ref 1.4–7.7)
Neutrophils Relative %: 59.9 % (ref 43.0–77.0)
Platelets: 241 10*3/uL (ref 150.0–400.0)
RBC: 4.95 Mil/uL (ref 4.22–5.81)
RDW: 13.9 % (ref 11.5–15.5)
WBC: 7.2 10*3/uL (ref 4.0–10.5)

## 2015-02-06 LAB — COMPREHENSIVE METABOLIC PANEL
ALK PHOS: 65 U/L (ref 39–117)
ALT: 18 U/L (ref 0–53)
AST: 13 U/L (ref 0–37)
Albumin: 4.1 g/dL (ref 3.5–5.2)
BILIRUBIN TOTAL: 0.4 mg/dL (ref 0.2–1.2)
BUN: 15 mg/dL (ref 6–23)
CO2: 30 mEq/L (ref 19–32)
Calcium: 8.9 mg/dL (ref 8.4–10.5)
Chloride: 101 mEq/L (ref 96–112)
Creatinine, Ser: 0.68 mg/dL (ref 0.40–1.50)
GFR: 130.71 mL/min (ref >60.00)
GLUCOSE: 177 mg/dL — AB (ref 70–99)
Potassium: 4.8 mEq/L (ref 3.5–5.1)
Sodium: 138 mEq/L (ref 135–145)
TOTAL PROTEIN: 6.4 g/dL (ref 6.0–8.3)

## 2015-02-06 LAB — HEMOGLOBIN A1C: Hgb A1c MFr Bld: 9.1 % — ABNORMAL HIGH (ref 4.6–6.5)

## 2015-02-08 ENCOUNTER — Encounter: Payer: Self-pay | Admitting: Internal Medicine

## 2015-02-08 ENCOUNTER — Ambulatory Visit (INDEPENDENT_AMBULATORY_CARE_PROVIDER_SITE_OTHER): Payer: 59 | Admitting: Internal Medicine

## 2015-02-08 VITALS — BP 132/84 | HR 98 | Temp 98.3°F | Resp 16 | Ht 72.0 in | Wt 317.2 lb

## 2015-02-08 DIAGNOSIS — T466X5A Adverse effect of antihyperlipidemic and antiarteriosclerotic drugs, initial encounter: Secondary | ICD-10-CM | POA: Insufficient documentation

## 2015-02-08 DIAGNOSIS — E785 Hyperlipidemia, unspecified: Secondary | ICD-10-CM | POA: Diagnosis not present

## 2015-02-08 DIAGNOSIS — I10 Essential (primary) hypertension: Secondary | ICD-10-CM

## 2015-02-08 DIAGNOSIS — Z789 Other specified health status: Secondary | ICD-10-CM

## 2015-02-08 DIAGNOSIS — IMO0002 Reserved for concepts with insufficient information to code with codable children: Secondary | ICD-10-CM

## 2015-02-08 DIAGNOSIS — E1165 Type 2 diabetes mellitus with hyperglycemia: Secondary | ICD-10-CM

## 2015-02-08 DIAGNOSIS — M791 Myalgia, unspecified site: Secondary | ICD-10-CM | POA: Insufficient documentation

## 2015-02-08 DIAGNOSIS — Z889 Allergy status to unspecified drugs, medicaments and biological substances status: Secondary | ICD-10-CM

## 2015-02-08 LAB — HM DIABETES FOOT EXAM: HM DIABETIC FOOT EXAM: NORMAL

## 2015-02-08 NOTE — Progress Notes (Signed)
Pre-visit discussion using our clinic review tool. No additional management support is needed unless otherwise documented below in the visit note.  

## 2015-02-08 NOTE — Patient Instructions (Addendum)
Your diabetes control  has not improved yet, so for  the next week please check your 2 hr post breakfast blood sugar   Send those readings to me in one week and I'll adjust the morning dose   Congratulations on the weight loss!   Your cholesterol has improved    The natural remedies for cholesterol have not been proven to reduce your risk for a heart attack.  Red Yeast Rice has not been proven either,  But does lower cholesterol, so if you want to try it , the dose is 600 mg twice daily in capsule form

## 2015-02-08 NOTE — Progress Notes (Signed)
Subjective:  Patient ID: Connor Ross, male    DOB: [birth year]  Age: 49 y.o. MRN: [id number]  CC: The primary encounter diagnosis was Statin intolerance. Diagnoses of Diabetes mellitus type 2, uncontrolled, Essential hypertension, and Hyperlipidemia with target LDL less than 70 were also pertinent to this visit.  HPI Connor Ross presents for follow up on diabetes type 2, uncontrolled,  Obesity, and hypertension.  He has been taking his medications as directed and following a log glycemic index diet.  He has lost weight since his last visit.  Connor Ross  His fastings have been 140 or less most of the time on 100 units of  75/25 in the evening He is drinking 300 ounces of water daily due to the heat .  He denies muscle cramps , excessive urination and dizziness.     Outpatient Prescriptions Prior to Visit  Medication Sig Dispense Refill  . glipiZIDE (GLUCOTROL) 10 MG tablet Take 1 tablet (10 mg total) by mouth 2 (two) times daily before a meal. 180 tablet 3  . glucose blood test strip For use twice daily  For diabetes testing   For uncontrolled diabetes 100 each 6  . HUMALOG MIX 75/25 KWIKPEN (75-25) 100 UNIT/ML Kwikpen Inject subcutaneously 20  units in the morning before breakfast and 100 units  before dinner 120 mL 1  . Insulin Pen Needle 32G X 6 MM MISC Use daily with insulin pen 50 each 0  . Lancets (FREESTYLE) lancets 1 each by Other route 2 (two) times daily. DX 250.02 100 each 12  . lisinopril (PRINIVIL,ZESTRIL) 10 MG tablet Take 1 tablet (10 mg total) by mouth daily. 90 tablet 3   No facility-administered medications prior to visit.    Review of Systems;  Patient denies headache, fevers, malaise, unintentional weight loss, skin rash, eye pain, sinus congestion and sinus pain, sore throat, dysphagia,  hemoptysis , cough, dyspnea, wheezing, chest pain, palpitations, orthopnea, edema, abdominal pain, nausea, melena, diarrhea, constipation, flank pain, dysuria, hematuria,  urinary  Frequency, nocturia, numbness, tingling, seizures,  Focal weakness, Loss of consciousness,  Tremor, insomnia, depression, anxiety, and suicidal ideation.      Objective:  BP 132/84 mmHg  Pulse 98  Temp(Src) 98.3 F (36.8 C) (Oral)  Resp 16  Ht 6' (1.829 m)  Wt 317 lb 4 oz (143.904 kg)  BMI 43.02 kg/m2  SpO2 97%  BP Readings from Last 3 Encounters:  02/08/15 132/84  11/02/14 144/80  07/27/14 146/82    Wt Readings from Last 3 Encounters:  02/08/15 317 lb 4 oz (143.904 kg)  11/02/14 325 lb (147.419 kg)  07/27/14 331 lb 4 oz (150.254 kg)    General appearance: alert, cooperative and appears stated age Ears: normal TM's and external ear canals both ears Throat: lips, mucosa, and tongue normal; teeth and gums normal Neck: no adenopathy, no carotid bruit, supple, symmetrical, trachea midline and thyroid not enlarged, symmetric, no tenderness/mass/nodules Back: symmetric, no curvature. ROM normal. No CVA tenderness. Lungs: clear to auscultation bilaterally Heart: regular rate and rhythm, S1, S2 normal, no murmur, click, rub or gallop Abdomen: soft, non-tender; bowel sounds normal; no masses,  no organomegaly Pulses: 2+ and symmetric Skin: Skin color, texture, turgor normal. No rashes or lesions Lymph nodes: Cervical, supraclavicular, and axillary nodes normal.  Lab Results  Component Value Date   HGBA1C 9.1* 02/06/2015   HGBA1C 9.0* 10/26/2014   HGBA1C 12.2* 07/25/2014    Lab Results  Component Value Date   CREATININE 0.68 02/06/2015  CREATININE 0.86 10/26/2014   CREATININE 0.8 07/25/2014    Lab Results  Component Value Date   WBC 7.2 02/06/2015   HGB 14.6 02/06/2015   HCT 43.6 02/06/2015   PLT 241.0 02/06/2015   GLUCOSE 177* 02/06/2015   CHOL 215* 02/06/2015   TRIG 188.0* 02/06/2015   HDL 27.70* 02/06/2015   LDLDIRECT 164.0 10/26/2014   LDLCALC 150* 02/06/2015   ALT 18 02/06/2015   AST 13 02/06/2015   NA 138 02/06/2015   K 4.8 02/06/2015   CL  101 02/06/2015   CREATININE 0.68 02/06/2015   BUN 15 02/06/2015   CO2 30 02/06/2015   HGBA1C 9.1* 02/06/2015   MICROALBUR 2.1* 07/27/2014    No results found.  Assessment & Plan:   Problem List Items Addressed This Visit      Unprioritized   Diabetes mellitus type 2, uncontrolled    Aic has not improved despite patient noting fastings < 150 on 100 units of 75/25 inslin i the evening adn 20 units in the morning .    Advised to continue current regimen and check  Pre lunch and pre dinner sugars over the next two weeks and submit readings for modification of regimen.       Relevant Medications   lisinopril (PRINIVIL,ZESTRIL) 20 MG tablet   Hyperlipidemia with target LDL less than 70    Managed with low GI diet  for improved triglycerides, but LDL is not at goal .  He did not tolerate fenofibrate due to muscle aches and is not interested in statin therapy at this time.   Lab Results  Component Value Date   CHOL 215* 02/06/2015   HDL 27.70* 02/06/2015   LDLCALC 150* 02/06/2015   LDLDIRECT 164.0 10/26/2014   TRIG 188.0* 02/06/2015   CHOLHDL 8 02/06/2015           Relevant Medications   lisinopril (PRINIVIL,ZESTRIL) 20 MG tablet   Hypertension    Improved  but not at goal of < 120/70. ,  Will increas the lisinopril to 20 mg daily       Relevant Medications   lisinopril (PRINIVIL,ZESTRIL) 20 MG tablet   Statin intolerance - Primary      I have changed Mr. Macgregor's lisinopril. I am also having him maintain his freestyle, Insulin Pen Needle, glucose blood, glipiZIDE, and HUMALOG MIX 75/25 KWIKPEN.  Meds ordered this encounter  Medications  . lisinopril (PRINIVIL,ZESTRIL) 20 MG tablet    Sig: Take 1 tablet (20 mg total) by mouth daily.    Dispense:  90 tablet    Refill:  1    Medications Discontinued During This Encounter  Medication Reason  . lisinopril (PRINIVIL,ZESTRIL) 10 MG tablet Reorder    Follow-up: No Follow-up on file.   Connor Mc, MD

## 2015-02-10 MED ORDER — LISINOPRIL 20 MG PO TABS: 20.0000 mg | ORAL_TABLET | Freq: Every day | ORAL | Status: DC

## 2015-02-10 NOTE — Assessment & Plan Note (Signed)
Improved  but not at goal of < 120/70. ,  Will increas the lisinopril to 20 mg daily

## 2015-02-10 NOTE — Assessment & Plan Note (Signed)
Aic has not improved despite patient noting fastings < 150 on 100 units of 75/25 inslin i the evening adn 20 units in the morning .    Advised to continue current regimen and check  Pre lunch and pre dinner sugars over the next two weeks and submit readings for modification of regimen.

## 2015-02-10 NOTE — Assessment & Plan Note (Addendum)
Managed with low GI diet  for improved triglycerides, but LDL is not at goal .  He did not tolerate fenofibrate due to muscle aches and is not interested in statin therapy at this time.   Lab Results  Component Value Date   CHOL 215* 02/06/2015   HDL 27.70* 02/06/2015   LDLCALC 150* 02/06/2015   LDLDIRECT 164.0 10/26/2014   TRIG 188.0* 02/06/2015   CHOLHDL 8 02/06/2015

## 2015-02-20 ENCOUNTER — Telehealth: Payer: Self-pay | Admitting: Internal Medicine

## 2015-02-20 NOTE — Telephone Encounter (Signed)
Patient called to see if MD had requested Increase in Lisinopril advised patient increase made 02/08/15 because BP not at goal per MD note to Lisinopril 20 MG.

## 2015-05-10 ENCOUNTER — Telehealth: Payer: Self-pay | Admitting: *Deleted

## 2015-05-10 ENCOUNTER — Other Ambulatory Visit (INDEPENDENT_AMBULATORY_CARE_PROVIDER_SITE_OTHER): Payer: 59

## 2015-05-10 DIAGNOSIS — E1169 Type 2 diabetes mellitus with other specified complication: Principal | ICD-10-CM

## 2015-05-10 DIAGNOSIS — I10 Essential (primary) hypertension: Secondary | ICD-10-CM

## 2015-05-10 DIAGNOSIS — R5383 Other fatigue: Secondary | ICD-10-CM

## 2015-05-10 DIAGNOSIS — E669 Obesity, unspecified: Secondary | ICD-10-CM

## 2015-05-10 DIAGNOSIS — E1159 Type 2 diabetes mellitus with other circulatory complications: Secondary | ICD-10-CM

## 2015-05-10 DIAGNOSIS — E119 Type 2 diabetes mellitus without complications: Secondary | ICD-10-CM | POA: Diagnosis not present

## 2015-05-10 DIAGNOSIS — I152 Hypertension secondary to endocrine disorders: Secondary | ICD-10-CM

## 2015-05-10 LAB — COMPREHENSIVE METABOLIC PANEL
ALK PHOS: 60 U/L (ref 39–117)
ALT: 19 U/L (ref 0–53)
AST: 12 U/L (ref 0–37)
Albumin: 4.1 g/dL (ref 3.5–5.2)
BILIRUBIN TOTAL: 0.4 mg/dL (ref 0.2–1.2)
BUN: 14 mg/dL (ref 6–23)
CO2: 30 meq/L (ref 19–32)
CREATININE: 0.8 mg/dL (ref 0.40–1.50)
Calcium: 9.4 mg/dL (ref 8.4–10.5)
Chloride: 99 mEq/L (ref 96–112)
GFR: 108.25 mL/min (ref >60.00)
GLUCOSE: 117 mg/dL — AB (ref 70–99)
Potassium: 4.4 mEq/L (ref 3.5–5.1)
SODIUM: 139 meq/L (ref 135–145)
TOTAL PROTEIN: 6.8 g/dL (ref 6.0–8.3)

## 2015-05-10 LAB — LDL CHOLESTEROL, DIRECT: Direct LDL: 132 mg/dL

## 2015-05-10 LAB — LIPID PANEL
CHOL/HDL RATIO: 9
CHOLESTEROL: 239 mg/dL — AB (ref 0–200)
HDL: 27.7 mg/dL — ABNORMAL LOW (ref >39.00)
NONHDL: 211.53
Triglycerides: 329 mg/dL — ABNORMAL HIGH (ref 0.0–149.0)
VLDL: 65.8 mg/dL — AB (ref 0.0–40.0)

## 2015-05-10 LAB — HEMOGLOBIN A1C: Hgb A1c MFr Bld: 9.9 % — ABNORMAL HIGH (ref 4.6–6.5)

## 2015-05-10 LAB — TSH: TSH: 3.28 u[IU]/mL (ref 0.35–4.50)

## 2015-05-10 NOTE — Telephone Encounter (Signed)
Labs and dx?  

## 2015-05-12 ENCOUNTER — Ambulatory Visit: Payer: 59 | Admitting: Internal Medicine

## 2015-05-13 ENCOUNTER — Encounter: Payer: Self-pay | Admitting: Internal Medicine

## 2015-05-15 ENCOUNTER — Encounter: Payer: Self-pay | Admitting: Internal Medicine

## 2015-05-15 ENCOUNTER — Ambulatory Visit (INDEPENDENT_AMBULATORY_CARE_PROVIDER_SITE_OTHER): Payer: 59 | Admitting: Internal Medicine

## 2015-05-15 VITALS — BP 138/90 | HR 84 | Temp 98.2°F | Resp 12 | Ht 72.0 in | Wt 328.5 lb

## 2015-05-15 DIAGNOSIS — Z794 Long term (current) use of insulin: Secondary | ICD-10-CM

## 2015-05-15 DIAGNOSIS — E785 Hyperlipidemia, unspecified: Secondary | ICD-10-CM | POA: Diagnosis not present

## 2015-05-15 DIAGNOSIS — G4733 Obstructive sleep apnea (adult) (pediatric): Secondary | ICD-10-CM

## 2015-05-15 DIAGNOSIS — Z9989 Dependence on other enabling machines and devices: Principal | ICD-10-CM

## 2015-05-15 DIAGNOSIS — I10 Essential (primary) hypertension: Secondary | ICD-10-CM

## 2015-05-15 DIAGNOSIS — E1121 Type 2 diabetes mellitus with diabetic nephropathy: Secondary | ICD-10-CM

## 2015-05-15 DIAGNOSIS — IMO0002 Reserved for concepts with insufficient information to code with codable children: Secondary | ICD-10-CM

## 2015-05-15 DIAGNOSIS — Z6841 Body Mass Index (BMI) 40.0 and over, adult: Secondary | ICD-10-CM

## 2015-05-15 DIAGNOSIS — E1165 Type 2 diabetes mellitus with hyperglycemia: Secondary | ICD-10-CM

## 2015-05-15 MED ORDER — DULAGLUTIDE 0.75 MG/0.5ML ~~LOC~~ SOAJ: 0.5000 mL | SUBCUTANEOUS | Status: DC

## 2015-05-15 NOTE — Patient Instructions (Addendum)
I do not want to adjust your insulin doses without more information.  Please Check your fasting and 2 hr post lunch sugars for the next week .    If you skip lunch,  Check the sugar 4 hours after your morning dose of insulin .Connor Ross   We will be adding trulicity if not too $$$$   Your crackers have a lot of carbohydrates in them. Look for WASA crackers ,  They're lower carb .   Try the Heritage Flakes cereal available at [facility] on the bottom shelf, with vanilla flavored almond milk  [employer] sells an organic almond mik, non refrigerated,  Called :"orgain"  That is fortified with protein and very low carb.  You can use this instead of cow's milk  Submit your readings every 2 weeks AND I will adjust your medications

## 2015-05-15 NOTE — Progress Notes (Signed)
Subjective:  Patient ID: Connor Ross, male    DOB: 01-22-1966  Age: 49 y.o. MRN: [id number]  CC: The primary encounter diagnosis was OSA on CPAP. Diagnoses of Uncontrolled type 2 diabetes mellitus with diabetic nephropathy, with long-term current use of insulin ([place]), Hyperlipidemia with target LDL less than 70, Essential hypertension, and Morbid obesity with BMI of 45.0-49.9, adult [facility]) were also pertinent to this visit.  HPI Connor Ross presents for follow up on uncontrolled DM.  a1c 9.9   bedtimesnack is a snack pack size of ritz crackers (10) and peanut butter.  Using 100 units of 75/25/ insulin and 20 units in the morning  Eats protein bar  Or eggs/meat for breakfast .  typcially takes insulin between 8 and 10 am before he eats    Has symptoms of  early neuropathy  Foot  exam normal  CPAP machine is 51 yrs old and recently stopped working.  Was told he could get a new one .  Pressure 8 cm h20   Outpatient Prescriptions Prior to Visit  Medication Sig Dispense Refill  . glipiZIDE (GLUCOTROL) 10 MG tablet Take 1 tablet (10 mg total) by mouth 2 (two) times daily before a meal. 180 tablet 3  . glucose blood test strip For use twice daily  For diabetes testing   For uncontrolled diabetes 100 each 6  . HUMALOG MIX 75/25 KWIKPEN (75-25) 100 UNIT/ML Kwikpen Inject subcutaneously 20  units in the morning before breakfast and 100 units  before dinner 120 mL 1  . Insulin Pen Needle 32G X 6 MM MISC Use daily with insulin pen 50 each 0  . Lancets (FREESTYLE) lancets 1 each by Other route 2 (two) times daily. DX 250.02 100 each 12  . lisinopril (PRINIVIL,ZESTRIL) 20 MG tablet Take 1 tablet (20 mg total) by mouth daily. 90 tablet 1   No facility-administered medications prior to visit.    Review of Systems;  Patient denies headache, fevers, malaise, unintentional weight loss, skin rash, eye pain, sinus congestion and sinus pain, sore throat, dysphagia,  hemoptysis , cough,  dyspnea, wheezing, chest pain, palpitations, orthopnea, edema, abdominal pain, nausea, melena, diarrhea, constipation, flank pain, dysuria, hematuria, urinary  Frequency, nocturia, numbness, tingling, seizures,  Focal weakness, Loss of consciousness,  Tremor, insomnia, depression, anxiety, and suicidal ideation.      Objective:  BP 138/90 mmHg  Pulse 84  Temp(Src) 98.2 F (36.8 C) (Oral)  Resp 12  Ht 6' (1.829 m)  Wt 328 lb 8 oz (149.007 kg)  BMI 44.54 kg/m2  SpO2 98%  BP Readings from Last 3 Encounters:  05/15/15 138/90  02/08/15 132/84  11/02/14 144/80    Wt Readings from Last 3 Encounters:  05/15/15 328 lb 8 oz (149.007 kg)  02/08/15 317 lb 4 oz (143.904 kg)  11/02/14 325 lb (147.419 kg)    General appearance: alert, cooperative and appears stated age Ears: normal TM's and external ear canals both ears Throat: lips, mucosa, and tongue normal; teeth and gums normal Neck: no adenopathy, no carotid bruit, supple, symmetrical, trachea midline and thyroid not enlarged, symmetric, no tenderness/mass/nodules Back: symmetric, no curvature. ROM normal. No CVA tenderness. Lungs: clear to auscultation bilaterally Heart: regular rate and rhythm, S1, S2 normal, no murmur, click, rub or gallop Abdomen: soft, non-tender; bowel sounds normal; no masses,  no organomegaly Pulses: 2+ and symmetric Skin: Skin color, texture, turgor normal. No rashes or lesions Lymph nodes: Cervical, supraclavicular, and axillary nodes normal.  Lab Results  Component Value Date   HGBA1C 9.9* 05/10/2015   HGBA1C 9.1* 02/06/2015   HGBA1C 9.0* 10/26/2014    Lab Results  Component Value Date   CREATININE 0.80 05/10/2015   CREATININE 0.68 02/06/2015   CREATININE 0.86 10/26/2014    Lab Results  Component Value Date   WBC 7.2 02/06/2015   HGB 14.6 02/06/2015   HCT 43.6 02/06/2015   PLT 241.0 02/06/2015   GLUCOSE 117* 05/10/2015   CHOL 239* 05/10/2015   TRIG 329.0* 05/10/2015   HDL 27.70*  05/10/2015   LDLDIRECT 132.0 05/10/2015   LDLCALC 150* 02/06/2015   ALT 19 05/10/2015   AST 12 05/10/2015   NA 139 05/10/2015   K 4.4 05/10/2015   CL 99 05/10/2015   CREATININE 0.80 05/10/2015   BUN 14 05/10/2015   CO2 30 05/10/2015   TSH 3.28 05/10/2015   HGBA1C 9.9* 05/10/2015   MICROALBUR 2.1* 07/27/2014    No results found.  Assessment & Plan:   Problem List Items Addressed This Visit    Diabetes mellitus type 2, uncontrolled ([place])     diabetes is under very poor control on current regimen,  Despite a low GI diet.He is using 100 units of mixed insulin at night and 20 units in the morning  Advised to check  blood sugars fasting and post prandial sate and submit for evaluation. Continue lisinopril.  Lab Results  Component Value Date   HGBA1C 9.9* 05/10/2015   Lab Results  Component Value Date   MICROALBUR 2.1* 07/27/2014         Relevant Medications   Dulaglutide (TRULICITY) 8.75 IE/3.3IR SOPN   Hyperlipidemia with target LDL less than 70    Managed with low GI diet  for improved triglycerides, but LDL is not at goal .  He did not tolerate fenofibrate due to muscle aches and is not interested in statin therapy at this time.   Lab Results  Component Value Date   CHOL 239* 05/10/2015   HDL 27.70* 05/10/2015   LDLCALC 150* 02/06/2015   LDLDIRECT 132.0 05/10/2015   TRIG 329.0* 05/10/2015   CHOLHDL 9 05/10/2015             Morbid obesity with BMI of 45.0-49.9, adult ([place])    He has gained weight again. I have addressed  BMI and recommended wt loss of 10% of body weigh over the next 6 months using a low glycemic index diet and regular exercise a minimum of 5 days per week.        Relevant Medications   Dulaglutide (TRULICITY) 5.18 AC/1.6SA SOPN   OSA on CPAP - Primary    Diagnosed by sleep study over 5 years ago.   he is wearing her CPAP every night a minimum of 6 hours per night and notes improved daytime wakefulness and decreased fatigue bur needs a new  machine.         Relevant Orders   For home use only DME continuous positive airway pressure (CPAP)   Hypertension    BP 138/90 mmHg  Pulse 84  Temp(Src) 98.2 F (36.8 C) (Oral)  Resp 12  Ht 6' (1.829 m)  Wt 328 lb 8 oz (149.007 kg)  BMI 44.54 kg/m2  SpO2 98%  Currently managed with lisinopril  20 mg, but not at goal.  Adding hctz        A total of 40 minutes was spent with patient more than half of which was spent in counseling patient on the above mentioned  issues , reviewing and explaining recent labs and imaging studies done, and coordination of care.  I am having Mr. Morency start on Dulaglutide. I am also having him maintain his freestyle, Insulin Pen Needle, glucose blood, glipiZIDE, HUMALOG MIX 75/25 KWIKPEN, and lisinopril.  Meds ordered this encounter  Medications  . Dulaglutide (TRULICITY) 4.25 ZD/6.3OV SOPN    Sig: Inject 0.5 mLs into the skin once a week.    Dispense:  2 mL    Refill:  11    30 day supply    There are no discontinued medications.  Follow-up: Return in about 3 months (around 08/15/2015) for follow up diabetes.   [name], MD

## 2015-05-15 NOTE — Progress Notes (Signed)
Pre-visit discussion using our clinic review tool. No additional management support is needed unless otherwise documented below in the visit note.  

## 2015-05-16 ENCOUNTER — Telehealth: Payer: Self-pay | Admitting: Internal Medicine

## 2015-05-16 MED ORDER — HYDROCHLOROTHIAZIDE 25 MG PO TABS: 25.0000 mg | ORAL_TABLET | Freq: Every day | ORAL | Status: DC

## 2015-05-16 NOTE — Telephone Encounter (Signed)
Pt called about needing a prior auth for medication Dulaglutide (TRULICITY) 0.93 AT/5.5DD SOPN number to get prior auth is [phone]. Thank You!

## 2015-05-16 NOTE — Assessment & Plan Note (Signed)
Diagnosed by sleep study over 5 years ago.   he is wearing her CPAP every night a minimum of 6 hours per night and notes improved daytime wakefulness and decreased fatigue bur needs a new machine.

## 2015-05-16 NOTE — Telephone Encounter (Signed)
I was not happy with his BP at last  3 visits.  I would like to add  HCTZ 25 mg daily to his lisinopril.  i sent rx to his mail order pharmacy.  It can be eventually combined with the lisinopril into one pill if need be.  Goal BP is 130/80  Or less

## 2015-05-16 NOTE — Assessment & Plan Note (Signed)
BP 138/90 mmHg  Pulse 84  Temp(Src) 98.2 F (36.8 C) (Oral)  Resp 12  Ht 6' (1.829 m)  Wt 328 lb 8 oz (149.007 kg)  BMI 44.54 kg/m2  SpO2 98%  Currently managed with lisinopril  20 mg, but not at goal.  Adding hctz

## 2015-05-16 NOTE — Assessment & Plan Note (Signed)
Managed with low GI diet  for improved triglycerides, but LDL is not at goal .  He did not tolerate fenofibrate due to muscle aches and is not interested in statin therapy at this time.   Lab Results  Component Value Date   CHOL 239* 05/10/2015   HDL 27.70* 05/10/2015   LDLCALC 150* 02/06/2015   LDLDIRECT 132.0 05/10/2015   TRIG 329.0* 05/10/2015   CHOLHDL 9 05/10/2015

## 2015-05-16 NOTE — Addendum Note (Signed)
Addended by: [name] on: 05/16/2015 08:46 PM   Modules accepted: Orders

## 2015-05-16 NOTE — Assessment & Plan Note (Addendum)
diabetes is under very poor control on current regimen,  Despite a low GI diet.He is using 100 units of mixed insulin at night and 20 units in the morning  Advised to check  blood sugars fasting and post prandial sate and submit for evaluation. Continue lisinopril.  Lab Results  Component Value Date   HGBA1C 9.9* 05/10/2015   Lab Results  Component Value Date   MICROALBUR 2.1* 07/27/2014

## 2015-05-16 NOTE — Assessment & Plan Note (Signed)
He has gained weight again. I have addressed  BMI and recommended wt loss of 10% of body weigh over the next 6 months using a low glycemic index diet and regular exercise a minimum of 5 days per week.

## 2015-05-17 NOTE — Telephone Encounter (Signed)
Called and left patient a detailed message per DPR.

## 2015-05-24 ENCOUNTER — Telehealth: Payer: Self-pay | Admitting: Internal Medicine

## 2015-05-24 DIAGNOSIS — Z9989 Dependence on other enabling machines and devices: Principal | ICD-10-CM

## 2015-05-24 DIAGNOSIS — G4733 Obstructive sleep apnea (adult) (pediatric): Secondary | ICD-10-CM

## 2015-05-24 NOTE — Telephone Encounter (Signed)
Patient called the office very upset.  Wanted to talk to a manager about how upset he is.  Talked with the patient regarding his concerns.    Patient initially called the office on 05/16/2015, states that he spoke with a front office person regarding his CPAP machine prescription.  No noted documented to support this.  States that he then called on Friday and left a voice mail to speak with Juliann Pulse (Dr. Lupita Dawn Assistant) regarding the same matter and no call back.  Made a third attempt to call Juliann Pulse, but per patient was unable to reach her VM this time.  Clarified with the patient her VM number and he had that wrong.  Explained that Juliann Pulse was emergently out of the office and that I would like to assist him.   After about five minutes of him stating how upset he was, I was able to get him to explain to me what the issue was.   Visit on 10/31 was to get a new prescription for a CPAP machine with [facility].  Original prescription that they received was for only a face mask.  This is why he was attempting to call the office to see if we could change the prescription for him. He gave me the contact information for [facility] to resubmit the correct prescription. Address to [facility] Phone number [phone] ext. [phone] and fax form to referral support at [phone].    I explained that I would submit his request to Dr. [name] and then fax the information to [facility], I would then return a call to him once I had completed his request at [phone].  He advised me that I could leave a detailed message if he didn't answer.  I gave patient my extension number if he had any additional concerns.    Please advise?

## 2015-05-24 NOTE — Telephone Encounter (Signed)
New DME order for CPAP machine (not mask) printed along with copy of prior sleep study

## 2015-05-24 NOTE — Telephone Encounter (Signed)
Faxed Prescription and Sleep Study to Connor Ross per patients request.  Called patient and discussed that I have completed his request and that if he had further concerns to call me at my extension.

## 2015-05-24 NOTE — Addendum Note (Signed)
Addended by: [name] on: 05/24/2015 12:45 PM   Modules accepted: Orders

## 2015-07-14 ENCOUNTER — Other Ambulatory Visit: Payer: Self-pay | Admitting: Internal Medicine

## 2015-08-14 ENCOUNTER — Telehealth: Payer: Self-pay | Admitting: *Deleted

## 2015-08-14 ENCOUNTER — Other Ambulatory Visit (INDEPENDENT_AMBULATORY_CARE_PROVIDER_SITE_OTHER): Payer: 59

## 2015-08-14 DIAGNOSIS — E1121 Type 2 diabetes mellitus with diabetic nephropathy: Secondary | ICD-10-CM

## 2015-08-14 LAB — COMPREHENSIVE METABOLIC PANEL
ALBUMIN: 3.9 g/dL (ref 3.5–5.2)
ALK PHOS: 55 U/L (ref 39–117)
ALT: 20 U/L (ref 0–53)
AST: 13 U/L (ref 0–37)
BUN: 12 mg/dL (ref 6–23)
CO2: 28 mEq/L (ref 19–32)
Calcium: 9.2 mg/dL (ref 8.4–10.5)
Chloride: 101 mEq/L (ref 96–112)
Creatinine, Ser: 0.77 mg/dL (ref 0.40–1.50)
GFR: 113.01 mL/min (ref >60.00)
Glucose, Bld: 155 mg/dL — ABNORMAL HIGH (ref 70–99)
POTASSIUM: 4.1 meq/L (ref 3.5–5.1)
Sodium: 138 mEq/L (ref 135–145)
TOTAL PROTEIN: 6.9 g/dL (ref 6.0–8.3)
Total Bilirubin: 0.4 mg/dL (ref 0.2–1.2)

## 2015-08-14 LAB — LIPID PANEL
Cholesterol: 228 mg/dL — ABNORMAL HIGH (ref 0–200)
HDL: 27.4 mg/dL — AB (ref >39.00)
NONHDL: 200.93
TRIGLYCERIDES: 221 mg/dL — AB (ref 0.0–149.0)
Total CHOL/HDL Ratio: 8
VLDL: 44.2 mg/dL — ABNORMAL HIGH (ref 0.0–40.0)

## 2015-08-14 LAB — HEMOGLOBIN A1C: HEMOGLOBIN A1C: 10.5 % — AB (ref 4.6–6.5)

## 2015-08-14 LAB — LDL CHOLESTEROL, DIRECT: Direct LDL: 144 mg/dL

## 2015-08-14 LAB — MICROALBUMIN / CREATININE URINE RATIO
Creatinine,U: 231.1 mg/dL
MICROALB UR: 1.9 mg/dL (ref 0.0–1.9)
MICROALB/CREAT RATIO: 0.8 mg/g (ref 0.0–30.0)

## 2015-08-14 NOTE — Telephone Encounter (Signed)
I did

## 2015-08-14 NOTE — Telephone Encounter (Signed)
Labs and dx?  

## 2015-08-14 NOTE — Telephone Encounter (Signed)
i hope you obtained a urine since the urine micro was ordered lat October and never obtained.

## 2015-08-16 ENCOUNTER — Encounter: Payer: Self-pay | Admitting: Internal Medicine

## 2015-08-16 ENCOUNTER — Ambulatory Visit (INDEPENDENT_AMBULATORY_CARE_PROVIDER_SITE_OTHER): Payer: 59 | Admitting: Internal Medicine

## 2015-08-16 VITALS — BP 164/96 | HR 81 | Temp 98.2°F | Resp 14 | Ht 72.0 in | Wt 327.8 lb

## 2015-08-16 DIAGNOSIS — E1165 Type 2 diabetes mellitus with hyperglycemia: Secondary | ICD-10-CM

## 2015-08-16 DIAGNOSIS — Z794 Long term (current) use of insulin: Secondary | ICD-10-CM | POA: Diagnosis not present

## 2015-08-16 DIAGNOSIS — I10 Essential (primary) hypertension: Secondary | ICD-10-CM | POA: Diagnosis not present

## 2015-08-16 DIAGNOSIS — Z6841 Body Mass Index (BMI) 40.0 and over, adult: Secondary | ICD-10-CM

## 2015-08-16 MED ORDER — INSULIN LISPRO PROT & LISPRO (75-25 MIX) 100 UNIT/ML KWIKPEN: PEN_INJECTOR | SUBCUTANEOUS | Status: DC

## 2015-08-16 MED ORDER — LISINOPRIL 40 MG PO TABS: 40.0000 mg | ORAL_TABLET | Freq: Every day | ORAL | Status: DC

## 2015-08-16 NOTE — Progress Notes (Signed)
Pre-visit discussion using our clinic review tool. No additional management support is needed unless otherwise documented below in the visit note.  

## 2015-08-16 NOTE — Patient Instructions (Addendum)
Increase the lisinopril to 40 mg daily for your blood pressure   Increase the evening dose of insulin to 110 if you are having any potatoes ,  Bread that is > 10 g carb , any rice,  Crackers, or ice cream.  Increase the morning dose of insulin to 25 units if you are having cereal for breakfast, or going to have crackers other than the WASA .    Start checking your sugar a 2nd time,  Either at lunch or at dinner .  Go for a brisk 15 minute walk after one of your meals every day  To help lower your blood sugars  GET BACK ON A DIABETIC DIET!!

## 2015-08-16 NOTE — Progress Notes (Signed)
Subjective:  Patient ID: Connor Ross, male    DOB: [birth year]  Age: 50 y.o. MRN: [id number]  CC: The primary encounter diagnosis was Uncontrolled type 2 diabetes mellitus with hyperglycemia, with long-term current use of insulin ([place]). Diagnoses of Essential hypertension and Morbid obesity with BMI of 45.0-49.9, adult [facility]) were also pertinent to this visit.  HPI Connor Ross presents for 3 month follow up on uncontrolled diabetes.  Patient was never able to start the Trulicity  bc PA was required..  We have obtained PA today and the copay is still too prohibitive.    HCTZ  Was stopped  Due to severe cramping which recurred every tme he restarted it.  BP elevated without the medication.    He is taking glipizide and  humalog 75/25  Insulin  20 units in the am and 100 units before dinner Sugars have been elevated in the am  . Only time he is checking  Due for eye exam with [facility]     Lab Results  Component Value Date   HGBA1C 10.5* 08/14/2015        Outpatient Prescriptions Prior to Visit  Medication Sig Dispense Refill  . glucose blood test strip For use twice daily  For diabetes testing   For uncontrolled diabetes 100 each 6  . Insulin Pen Needle 32G X 6 MM MISC Use daily with insulin pen 50 each 0  . Lancets (FREESTYLE) lancets 1 each by Other route 2 (two) times daily. DX 250.02 100 each 12  . glipiZIDE (GLUCOTROL) 10 MG tablet Take 1 tablet (10 mg total) by mouth 2 (two) times daily before a meal. 180 tablet 3  . HUMALOG MIX 75/25 KWIKPEN (75-25) 100 UNIT/ML Kwikpen Inject subcutaneously 20  units in the morning before breakfast and 100 units  before dinner 120 mL 1  . lisinopril (PRINIVIL,ZESTRIL) 20 MG tablet Take 1 tablet by mouth  daily 90 tablet 1  . Dulaglutide (TRULICITY) A999333 0000000 SOPN Inject 0.5 mLs into the skin once a week. (Patient not taking: Reported on 08/16/2015) 2 mL 11  . hydrochlorothiazide (HYDRODIURIL) 25 MG tablet Take 1 tablet  (25 mg total) by mouth daily. (Patient not taking: Reported on 08/16/2015) 90 tablet 3   No facility-administered medications prior to visit.    Review of Systems;  Patient denies headache, fevers, malaise, unintentional weight loss, skin rash, eye pain, sinus congestion and sinus pain, sore throat, dysphagia,  hemoptysis , cough, dyspnea, wheezing, chest pain, palpitations, orthopnea, edema, abdominal pain, nausea, melena, diarrhea, constipation, flank pain, dysuria, hematuria, urinary  Frequency, nocturia, numbness, tingling, seizures,  Focal weakness, Loss of consciousness,  Tremor, insomnia, depression, anxiety, and suicidal ideation.      Objective:  BP 164/96 mmHg  Pulse 81  Temp(Src) 98.2 F (36.8 C) (Oral)  Resp 14  Ht 6' (1.829 m)  Wt 327 lb 12 oz (148.666 kg)  BMI 44.44 kg/m2  SpO2 95%  BP Readings from Last 3 Encounters:  08/16/15 164/96  05/15/15 138/90  02/08/15 132/84    Wt Readings from Last 3 Encounters:  08/16/15 327 lb 12 oz (148.666 kg)  05/15/15 328 lb 8 oz (149.007 kg)  02/08/15 317 lb 4 oz (143.904 kg)    General appearance: alert, cooperative and appears stated age Ears: normal TM's and external ear canals both ears Throat: lips, mucosa, and tongue normal; teeth and gums normal Neck: no adenopathy, no carotid bruit, supple, symmetrical, trachea midline and thyroid not enlarged, symmetric,  no tenderness/mass/nodules Back: symmetric, no curvature. ROM normal. No CVA tenderness. Lungs: clear to auscultation bilaterally Heart: regular rate and rhythm, S1, S2 normal, no murmur, click, rub or gallop Abdomen: soft, non-tender; bowel sounds normal; no masses,  no organomegaly Pulses: 2+ and symmetric Skin: Skin color, texture, turgor normal. No rashes or lesions Lymph nodes: Cervical, supraclavicular, and axillary nodes normal.  Lab Results  Component Value Date   HGBA1C 10.5* 08/14/2015   HGBA1C 9.9* 05/10/2015   HGBA1C 9.1* 02/06/2015    Lab Results   Component Value Date   CREATININE 0.77 08/14/2015   CREATININE 0.80 05/10/2015   CREATININE 0.68 02/06/2015    Lab Results  Component Value Date   WBC 7.2 02/06/2015   HGB 14.6 02/06/2015   HCT 43.6 02/06/2015   PLT 241.0 02/06/2015   GLUCOSE 155* 08/14/2015   CHOL 228* 08/14/2015   TRIG 221.0* 08/14/2015   HDL 27.40* 08/14/2015   LDLDIRECT 144.0 08/14/2015   LDLCALC 150* 02/06/2015   ALT 20 08/14/2015   AST 13 08/14/2015   NA 138 08/14/2015   K 4.1 08/14/2015   CL 101 08/14/2015   CREATININE 0.77 08/14/2015   BUN 12 08/14/2015   CO2 28 08/14/2015   TSH 3.28 05/10/2015   HGBA1C 10.5* 08/14/2015   MICROALBUR 1.9 08/14/2015    No results found.  Assessment & Plan:   Problem List Items Addressed This Visit    Diabetes mellitus type 2, uncontrolled ([place]) - Primary    Insulin doses were increased today to 25 units in the am and 115 in the evening.  Diet reviewed . He is taking lisinopril and I have recommended adding aspiring daily   Lab Results  Component Value Date   HGBA1C 10.5* 08/14/2015   Lab Results  Component Value Date   MICROALBUR 1.9 08/14/2015   Lab Results  Component Value Date   CREATININE 0.77 08/14/2015         Relevant Medications   lisinopril (PRINIVIL,ZESTRIL) 40 MG tablet   Insulin Lispro Prot & Lispro (HUMALOG MIX 75/25 KWIKPEN) (75-25) 100 UNIT/ML Kwikpen   Morbid obesity with BMI of 45.0-49.9, adult ([place])    He has gained weight again due to dietary indulgences and lack of regular exercise. . I have addressed  BMI and recommended wt loss of 10% of body weigh over the next 6 months using a low glycemic index diet and regular exercise a minimum of 5 days per week.          Relevant Medications   Insulin Lispro Prot & Lispro (HUMALOG MIX 75/25 KWIKPEN) (75-25) 100 UNIT/ML Kwikpen   Hypertension    Intolerant of hctz due to recurrent cramps.  Will increased the lisinopril to 40 mg daily       Relevant Medications   lisinopril  (PRINIVIL,ZESTRIL) 40 MG tablet      A total of 25 minutes of face to face time was spent with patient more than half of which was spent in counselling about the above mentioned conditions  and coordination of care    I have changed Mr. Argetsinger HUMALOG MIX 75/25 KWIKPEN to Insulin Lispro Prot & Lispro. I have also changed his lisinopril. I am also having him maintain his freestyle, Insulin Pen Needle, glucose blood, Dulaglutide, and hydrochlorothiazide.  Meds ordered this encounter  Medications  . lisinopril (PRINIVIL,ZESTRIL) 40 MG tablet    Sig: Take 1 tablet (40 mg total) by mouth daily.    Dispense:  90 tablet  Refill:  1  . Insulin Lispro Prot & Lispro (HUMALOG MIX 75/25 KWIKPEN) (75-25) 100 UNIT/ML Kwikpen    Sig: 30 units in the AM and 120 units in the PM pre meal    Dispense:  135 mL    Refill:  1    KEEP ON FILE FOR NEXT REFILL NOTE DOSE INCREASE    Medications Discontinued During This Encounter  Medication Reason  . lisinopril (PRINIVIL,ZESTRIL) 20 MG tablet Reorder  . HUMALOG MIX 75/25 KWIKPEN (75-25) 100 UNIT/ML Kwikpen Reorder    Follow-up: Return in about 3 months (around 11/13/2015) for follow up diabetes.   [name], MD

## 2015-08-16 NOTE — Telephone Encounter (Signed)
PA approved on Cover my meds today.

## 2015-08-17 ENCOUNTER — Other Ambulatory Visit: Payer: Self-pay

## 2015-08-17 MED ORDER — GLIPIZIDE 10 MG PO TABS: 10.0000 mg | ORAL_TABLET | Freq: Two times a day (BID) | ORAL | Status: DC

## 2015-08-19 NOTE — Assessment & Plan Note (Signed)
Intolerant of hctz due to recurrent cramps.  Will increased the lisinopril to 40 mg daily

## 2015-08-19 NOTE — Assessment & Plan Note (Signed)
He has gained weight again due to dietary indulgences and lack of regular exercise. . I have addressed  BMI and recommended wt loss of 10% of body weigh over the next 6 months using a low glycemic index diet and regular exercise a minimum of 5 days per week.

## 2015-08-19 NOTE — Assessment & Plan Note (Signed)
Insulin doses were increased today to 25 units in the am and 115 in the evening.  Diet reviewed . He is taking lisinopril and I have recommended adding aspiring daily   Lab Results  Component Value Date   HGBA1C 10.5* 08/14/2015   Lab Results  Component Value Date   MICROALBUR 1.9 08/14/2015   Lab Results  Component Value Date   CREATININE 0.77 08/14/2015

## 2015-11-16 ENCOUNTER — Telehealth: Payer: Self-pay | Admitting: *Deleted

## 2015-11-16 DIAGNOSIS — Z794 Long term (current) use of insulin: Secondary | ICD-10-CM

## 2015-11-16 DIAGNOSIS — E785 Hyperlipidemia, unspecified: Secondary | ICD-10-CM

## 2015-11-16 DIAGNOSIS — E1165 Type 2 diabetes mellitus with hyperglycemia: Secondary | ICD-10-CM

## 2015-11-16 DIAGNOSIS — I10 Essential (primary) hypertension: Secondary | ICD-10-CM

## 2015-11-16 DIAGNOSIS — IMO0001 Reserved for inherently not codable concepts without codable children: Secondary | ICD-10-CM

## 2015-11-16 NOTE — Telephone Encounter (Signed)
Pt coming in tomorrow, labs and dx?

## 2015-11-17 ENCOUNTER — Other Ambulatory Visit (INDEPENDENT_AMBULATORY_CARE_PROVIDER_SITE_OTHER): Payer: 59

## 2015-11-17 DIAGNOSIS — E785 Hyperlipidemia, unspecified: Secondary | ICD-10-CM

## 2015-11-17 DIAGNOSIS — E1165 Type 2 diabetes mellitus with hyperglycemia: Secondary | ICD-10-CM

## 2015-11-17 DIAGNOSIS — IMO0001 Reserved for inherently not codable concepts without codable children: Secondary | ICD-10-CM

## 2015-11-17 DIAGNOSIS — Z794 Long term (current) use of insulin: Secondary | ICD-10-CM | POA: Diagnosis not present

## 2015-11-17 DIAGNOSIS — I10 Essential (primary) hypertension: Secondary | ICD-10-CM

## 2015-11-17 LAB — HEMOGLOBIN A1C: Hgb A1c MFr Bld: 9.4 % — ABNORMAL HIGH (ref 4.6–6.5)

## 2015-11-17 LAB — COMPREHENSIVE METABOLIC PANEL
ALBUMIN: 4.5 g/dL (ref 3.5–5.2)
ALT: 21 U/L (ref 0–53)
AST: 15 U/L (ref 0–37)
Alkaline Phosphatase: 55 U/L (ref 39–117)
BUN: 15 mg/dL (ref 6–23)
CALCIUM: 9.6 mg/dL (ref 8.4–10.5)
CHLORIDE: 99 meq/L (ref 96–112)
CO2: 26 mEq/L (ref 19–32)
CREATININE: 0.81 mg/dL (ref 0.40–1.50)
GFR: 106.48 mL/min (ref >60.00)
Glucose, Bld: 197 mg/dL — ABNORMAL HIGH (ref 70–99)
Potassium: 4.8 mEq/L (ref 3.5–5.1)
Sodium: 136 mEq/L (ref 135–145)
TOTAL PROTEIN: 7.2 g/dL (ref 6.0–8.3)
Total Bilirubin: 0.5 mg/dL (ref 0.2–1.2)

## 2015-11-17 LAB — LIPID PANEL
CHOL/HDL RATIO: 9
CHOLESTEROL: 229 mg/dL — AB (ref 0–200)
HDL: 25.1 mg/dL — ABNORMAL LOW (ref >39.00)
NONHDL: 203.78
TRIGLYCERIDES: 249 mg/dL — AB (ref 0.0–149.0)
VLDL: 49.8 mg/dL — AB (ref 0.0–40.0)

## 2015-11-17 LAB — LDL CHOLESTEROL, DIRECT: Direct LDL: 149 mg/dL

## 2015-11-17 LAB — MICROALBUMIN / CREATININE URINE RATIO
CREATININE, U: 171.3 mg/dL
Microalb Creat Ratio: 1.2 mg/g (ref 0.0–30.0)
Microalb, Ur: 2 mg/dL — ABNORMAL HIGH (ref 0.0–1.9)

## 2015-11-19 ENCOUNTER — Encounter: Payer: Self-pay | Admitting: Internal Medicine

## 2015-11-22 ENCOUNTER — Encounter: Payer: Self-pay | Admitting: Internal Medicine

## 2015-11-22 ENCOUNTER — Ambulatory Visit (INDEPENDENT_AMBULATORY_CARE_PROVIDER_SITE_OTHER): Payer: 59 | Admitting: Internal Medicine

## 2015-11-22 VITALS — BP 138/72 | HR 100 | Temp 98.1°F | Resp 14 | Ht 72.0 in | Wt 317.2 lb

## 2015-11-22 DIAGNOSIS — E1165 Type 2 diabetes mellitus with hyperglycemia: Secondary | ICD-10-CM

## 2015-11-22 DIAGNOSIS — E1121 Type 2 diabetes mellitus with diabetic nephropathy: Secondary | ICD-10-CM | POA: Diagnosis not present

## 2015-11-22 DIAGNOSIS — IMO0002 Reserved for concepts with insufficient information to code with codable children: Secondary | ICD-10-CM

## 2015-11-22 DIAGNOSIS — Z6841 Body Mass Index (BMI) 40.0 and over, adult: Secondary | ICD-10-CM

## 2015-11-22 DIAGNOSIS — I10 Essential (primary) hypertension: Secondary | ICD-10-CM | POA: Diagnosis not present

## 2015-11-22 NOTE — Progress Notes (Signed)
Subjective:  Patient ID: Connor Ross, male    DOB: 15-Jul-1966  Age: 50 y.o. 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 Diagnoses of Essential hypertension and Morbid obesity with BMI of 45.0-49.9, adult [facility]) were also pertinent to this visit.last Sunday  HPI Connor Ross presents for follow up on uncontrolled DM with obesity and  OSA  With chronic use of  CPAP.   Marland KitchenPatient is using CPAP every night a minimum of 6 hours per night and notes improved daytime wakefulness and decreased fatigue .    Feels much better, more energy,  Losing weight through adherence to [employer] diet for the past 4 weeks and has lost 11 lbs so far. Has dropped 2 pants sizes.   Has more energy than he's had  In the last 10 years.  . Walking several times per week. Averaging 3 times per week between walking and working in the yard.   Drinking 2 high protein shakes daily,  Along with a appetite suppressant /detary supplement called Total Control, (all herbal).  Also using a supplement to curb appetite called Snack Defense.  Denies palpitations,  Headaches and diarrhea. .  BP improved.  A1c has dropped from 10.5 to 9.4 .  CBGs are still elevated in the morning but are coming down.  Had a low sugar at 4:40 was 76, felt a little shaky .   Has been suspending the morning insulin dose if  Fasting blood sugar is < 140 .   Evening uses 120 units  Of insulin and 10 mg glipizide   evening sugars 143,  Eating a lot of fish,  chicken  With broccoli.     Lab Results  Component Value Date   HGBA1C 9.4* 11/17/2015     Outpatient Prescriptions Prior to Visit  Medication Sig Dispense Refill  . glipiZIDE (GLUCOTROL) 10 MG tablet Take 1 tablet (10 mg total) by mouth 2 (two) times daily before a meal. 180 tablet 3  . glucose blood test strip For use twice daily  For diabetes testing   For uncontrolled diabetes 100 each 6  . Insulin  Lispro Prot & Lispro (HUMALOG MIX 75/25 KWIKPEN) (75-25) 100 UNIT/ML Kwikpen 30 units in the AM and 120 units in the PM pre meal 135 mL 1  . Insulin Pen Needle 32G X 6 MM MISC Use daily with insulin pen 50 each 0  . Lancets (FREESTYLE) lancets 1 each by Other route 2 (two) times daily. DX 250.02 100 each 12  . lisinopril (PRINIVIL,ZESTRIL) 40 MG tablet Take 1 tablet (40 mg total) by mouth daily. 90 tablet 1  . Dulaglutide (TRULICITY) A999333 0000000 SOPN Inject 0.5 mLs into the skin once a week. (Patient not taking: Reported on 08/16/2015) 2 mL 11  . hydrochlorothiazide (HYDRODIURIL) 25 MG tablet Take 1 tablet (25 mg total) by mouth daily. (Patient not taking: Reported on 08/16/2015) 90 tablet 3   No facility-administered medications prior to visit.    Review of Systems;  Patient denies headache, fevers, malaise, unintentional weight loss, skin rash, eye pain, sinus congestion and sinus pain, sore throat, dysphagia,  hemoptysis , cough, dyspnea, wheezing, chest pain, palpitations, orthopnea, edema, abdominal pain, nausea, melena, diarrhea, constipation, flank pain, dysuria, hematuria, urinary  Frequency, nocturia, numbness, tingling, seizures,  Focal weakness, Loss of consciousness,  Tremor, insomnia, depression, anxiety, and suicidal ideation.      Objective:  BP 138/72 mmHg  Pulse 100  Temp(Src)  98.1 F (36.7 C) (Oral)  Resp 14  Ht 6' (1.829 m)  Wt 317 lb 4 oz (143.904 kg)  BMI 43.02 kg/m2  SpO2 96%  BP Readings from Last 3 Encounters:  11/22/15 138/72  08/16/15 164/96  05/15/15 138/90    Wt Readings from Last 3 Encounters:  11/22/15 317 lb 4 oz (143.904 kg)  08/16/15 327 lb 12 oz (148.666 kg)  05/15/15 328 lb 8 oz (149.007 kg)    General appearance: alert, cooperative and appears stated age Ears: normal TM's and external ear canals both ears Throat: lips, mucosa, and tongue normal; teeth and gums normal Neck: no adenopathy, no carotid bruit, supple, symmetrical, trachea midline  and thyroid not enlarged, symmetric, no tenderness/mass/nodules Back: symmetric, no curvature. ROM normal. No CVA tenderness. Lungs: clear to auscultation bilaterally Heart: regular rate and rhythm, S1, S2 normal, no murmur, click, rub or gallop Abdomen: soft, non-tender; bowel sounds normal; no masses,  no organomegaly Pulses: 2+ and symmetric Skin: Skin color, texture, turgor normal. No rashes or lesions Lymph nodes: Cervical, supraclavicular, and axillary nodes normal.  Lab Results  Component Value Date   HGBA1C 9.4* 11/17/2015   HGBA1C 10.5* 08/14/2015   HGBA1C 9.9* 05/10/2015    Lab Results  Component Value Date   CREATININE 0.81 11/17/2015   CREATININE 0.77 08/14/2015   CREATININE 0.80 05/10/2015    Lab Results  Component Value Date   WBC 7.2 02/06/2015   HGB 14.6 02/06/2015   HCT 43.6 02/06/2015   PLT 241.0 02/06/2015   GLUCOSE 197* 11/17/2015   CHOL 229* 11/17/2015   TRIG 249.0* 11/17/2015   HDL 25.10* 11/17/2015   LDLDIRECT 149.0 11/17/2015   LDLCALC 150* 02/06/2015   ALT 21 11/17/2015   AST 15 11/17/2015   NA 136 11/17/2015   K 4.8 11/17/2015   CL 99 11/17/2015   CREATININE 0.81 11/17/2015   BUN 15 11/17/2015   CO2 26 11/17/2015   TSH 3.28 05/10/2015   HGBA1C 9.4* 11/17/2015   MICROALBUR 2.0* 11/17/2015      Assessment & Plan:   Problem List Items Addressed This Visit    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 He has microscopic proteinuria and lisinopril has been prescribed.    Foot exam is normal and eye exam advised. He did NOT start Trulicity due to cost.  He is using glipizide and 70/30 insulin and has been advised to reduce the evening dose to 110 units due to recurrent hypoglycemic events in the early morning.   Lab Results  Component Value Date   HGBA1C 9.4* 11/17/2015   Lab Results  Component Value Date    MICROALBUR 2.0* 11/17/2015         Morbid obesity with BMI of 45.0-49.9, adult [facility])    Patient now motivated to lose 100 lbs using Herbalife.[name]  He is walking daily for exercise. I have congratulated him in reduction of   BMI and encouraged  Continued weight loss  using a low glycemic index diet and regular exercise a minimum of 5 days per week.        Hypertension    Well controlled on current regimen. Renal function stable, no changes today.  Lab Results  Component Value Date   CREATININE 0.81 11/17/2015   Lab Results  Component Value Date   NA 136 11/17/2015   K 4.8 11/17/2015  CL 99 11/17/2015   CO2 26 11/17/2015          A total of 25 minutes of face to face time was spent with patient more than half of which was spent in counselling about the above mentioned conditions  and coordination of care   I have discontinued Mr. [place]'s hydrochlorothiazide. I am also having him maintain his freestyle, Insulin Pen Needle, glucose blood, Dulaglutide, lisinopril, Insulin Lispro Prot & Lispro, and glipiZIDE.  No orders of the defined types were placed in this encounter.    Medications Discontinued During This Encounter  Medication Reason  . hydrochlorothiazide (HYDRODIURIL) 25 MG tablet Patient Preference    Follow-up: Return in about 3 months (around 02/22/2016) for follow up diabetes.   [name], MD

## 2015-11-22 NOTE — Progress Notes (Signed)
Pre-visit discussion using our clinic review tool. No additional management support is needed unless otherwise documented below in the visit note.  

## 2015-11-22 NOTE — Patient Instructions (Addendum)
Congrats!!!!  If you continue to have low blood sugars in the morning,  I would Lower your evening insulin dose to 100 units .  Send me readings every 2 weeks. Of your sugars so we can continue to adjust your medications   See you in 3 months

## 2015-11-25 NOTE — Assessment & Plan Note (Signed)
Improving with adherence to Herbalife Diet which he has been using for the past 3-4 weeks. He has microscopic proteinuria and lisinopril has been prescribed.    Foot exam is normal and eye exam advised. He did NOT start Trulicity due to cost.  He is using glipizide and 70/30 insulin and has been advised to reduce the evening dose to 110 units due to recurrent hypoglycemic events in the early morning.   Lab Results  Component Value Date   HGBA1C 9.4* 11/17/2015   Lab Results  Component Value Date   MICROALBUR 2.0* 11/17/2015

## 2015-11-25 NOTE — Assessment & Plan Note (Signed)
Patient now motivated to lose 100 lbs using Herbalife.[name]  He is walking daily for exercise. I have congratulated him in reduction of   BMI and encouraged  Continued weight loss  using a low glycemic index diet and regular exercise a minimum of 5 days per week.

## 2015-11-25 NOTE — Assessment & Plan Note (Signed)
Well controlled on current regimen. Renal function stable, no changes today.  Lab Results  Component Value Date   CREATININE 0.81 11/17/2015   Lab Results  Component Value Date   NA 136 11/17/2015   K 4.8 11/17/2015   CL 99 11/17/2015   CO2 26 11/17/2015

## 2015-12-01 ENCOUNTER — Telehealth: Payer: Self-pay | Admitting: Internal Medicine

## 2015-12-01 NOTE — Telephone Encounter (Signed)
Faxed Office notes to [facility] for CPAP compliance.

## 2016-01-19 LAB — HM DIABETES EYE EXAM

## 2016-01-29 ENCOUNTER — Encounter: Payer: Self-pay | Admitting: Internal Medicine

## 2016-02-21 ENCOUNTER — Telehealth: Payer: Self-pay

## 2016-02-21 DIAGNOSIS — E1121 Type 2 diabetes mellitus with diabetic nephropathy: Secondary | ICD-10-CM

## 2016-02-21 DIAGNOSIS — IMO0002 Reserved for concepts with insufficient information to code with codable children: Secondary | ICD-10-CM

## 2016-02-21 DIAGNOSIS — Z1159 Encounter for screening for other viral diseases: Secondary | ICD-10-CM

## 2016-02-21 DIAGNOSIS — E1165 Type 2 diabetes mellitus with hyperglycemia: Principal | ICD-10-CM

## 2016-02-21 DIAGNOSIS — E785 Hyperlipidemia, unspecified: Secondary | ICD-10-CM

## 2016-02-21 NOTE — Telephone Encounter (Signed)
Pt coming for fasting labs 02/22/16. Please place future orders. Thank you.

## 2016-02-22 ENCOUNTER — Other Ambulatory Visit (INDEPENDENT_AMBULATORY_CARE_PROVIDER_SITE_OTHER): Payer: 59

## 2016-02-22 DIAGNOSIS — E1165 Type 2 diabetes mellitus with hyperglycemia: Secondary | ICD-10-CM | POA: Diagnosis not present

## 2016-02-22 DIAGNOSIS — Z1159 Encounter for screening for other viral diseases: Secondary | ICD-10-CM

## 2016-02-22 DIAGNOSIS — IMO0002 Reserved for concepts with insufficient information to code with codable children: Secondary | ICD-10-CM

## 2016-02-22 DIAGNOSIS — E785 Hyperlipidemia, unspecified: Secondary | ICD-10-CM

## 2016-02-22 DIAGNOSIS — E1121 Type 2 diabetes mellitus with diabetic nephropathy: Secondary | ICD-10-CM

## 2016-02-22 LAB — COMPREHENSIVE METABOLIC PANEL
ALBUMIN: 4.3 g/dL (ref 3.5–5.2)
ALT: 20 U/L (ref 0–53)
AST: 15 U/L (ref 0–37)
Alkaline Phosphatase: 61 U/L (ref 39–117)
BILIRUBIN TOTAL: 0.4 mg/dL (ref 0.2–1.2)
BUN: 17 mg/dL (ref 6–23)
CHLORIDE: 104 meq/L (ref 96–112)
CO2: 27 mEq/L (ref 19–32)
Calcium: 9.4 mg/dL (ref 8.4–10.5)
Creatinine, Ser: 0.78 mg/dL (ref 0.40–1.50)
GFR: 111.11 mL/min (ref >60.00)
Glucose, Bld: 100 mg/dL — ABNORMAL HIGH (ref 70–99)
Potassium: 4.2 mEq/L (ref 3.5–5.1)
Sodium: 139 mEq/L (ref 135–145)
TOTAL PROTEIN: 7.3 g/dL (ref 6.0–8.3)

## 2016-02-22 LAB — LIPID PANEL
CHOLESTEROL: 211 mg/dL — AB (ref 0–200)
HDL: 25.9 mg/dL — ABNORMAL LOW (ref >39.00)
LDL CALC: 159 mg/dL — AB (ref 0–99)
NonHDL: 185.42
Total CHOL/HDL Ratio: 8
Triglycerides: 133 mg/dL (ref 0.0–149.0)
VLDL: 26.6 mg/dL (ref 0.0–40.0)

## 2016-02-22 LAB — LDL CHOLESTEROL, DIRECT: Direct LDL: 158 mg/dL

## 2016-02-22 LAB — HEMOGLOBIN A1C: Hgb A1c MFr Bld: 7.4 % — ABNORMAL HIGH (ref 4.6–6.5)

## 2016-02-22 NOTE — Telephone Encounter (Signed)
While pt was here for labs this morning he requested to have a hepatitis C done. Please advise and place order if applicable.

## 2016-02-23 LAB — HEPATITIS C ANTIBODY: HCV Ab: NEGATIVE

## 2016-02-25 ENCOUNTER — Encounter: Payer: Self-pay | Admitting: Internal Medicine

## 2016-02-28 ENCOUNTER — Encounter: Payer: Self-pay | Admitting: Internal Medicine

## 2016-02-28 ENCOUNTER — Ambulatory Visit (INDEPENDENT_AMBULATORY_CARE_PROVIDER_SITE_OTHER): Payer: 59 | Admitting: Internal Medicine

## 2016-02-28 DIAGNOSIS — M79604 Pain in right leg: Secondary | ICD-10-CM

## 2016-02-28 DIAGNOSIS — E785 Hyperlipidemia, unspecified: Secondary | ICD-10-CM

## 2016-02-28 DIAGNOSIS — I10 Essential (primary) hypertension: Secondary | ICD-10-CM | POA: Diagnosis not present

## 2016-02-28 DIAGNOSIS — M545 Low back pain, unspecified: Secondary | ICD-10-CM

## 2016-02-28 DIAGNOSIS — Z6841 Body Mass Index (BMI) 40.0 and over, adult: Secondary | ICD-10-CM

## 2016-02-28 DIAGNOSIS — E1121 Type 2 diabetes mellitus with diabetic nephropathy: Secondary | ICD-10-CM

## 2016-02-28 MED ORDER — GABAPENTIN 300 MG PO CAPS: 300.0000 mg | ORAL_CAPSULE | Freq: Every day | ORAL | 1 refills | Status: DC

## 2016-02-28 MED ORDER — DICLOFENAC SODIUM 75 MG PO TBEC: 75.0000 mg | DELAYED_RELEASE_TABLET | Freq: Two times a day (BID) | ORAL | 2 refills | Status: DC

## 2016-02-28 MED ORDER — TRAMADOL HCL 50 MG PO TABS: 50.0000 mg | ORAL_TABLET | Freq: Four times a day (QID) | ORAL | 2 refills | Status: DC | PRN

## 2016-02-28 NOTE — Patient Instructions (Signed)
I  propose the following trial/ regimen for your pain   STOP THE IBUPROFEN.  IT 'S GOING TO GIVE YOU AN ULCER  1) Take diclofenac every 12 hours.  1 tablet   2) take  1 tablet of tramadol every 6 hours,  Or 2 tablets every 12 hours   3) Take 500 mg tylenol eery 6 hours  Or 2 tablets every 12 hours  4) take 1 tablet of gabapentin at bedtime   If this regimen makes your pain tolerable,  I will refill  Continually using [facility]  If not, we will try another regimen

## 2016-02-28 NOTE — Progress Notes (Signed)
Pre-visit discussion using our clinic review tool. No additional management support is needed unless otherwise documented below in the visit note.  
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
## 2016-03-02 ENCOUNTER — Encounter: Payer: Self-pay | Admitting: Internal Medicine

## 2016-03-02 NOTE — Assessment & Plan Note (Signed)
Well controlled on current regimen. Renal function stable, no changes today.  Lab Results  Component Value Date   CREATININE 0.78 02/22/2016   Lab Results  Component Value Date   NA 139 02/22/2016   K 4.2 02/22/2016   CL 104 02/22/2016   CO2 27 02/22/2016

## 2016-03-02 NOTE — Assessment & Plan Note (Signed)
Patient still motivated to lose 100 lbs using Herbalife.[name]  He is walking daily for exercise. I have congratulated him in reduction of   BMI and encouraged  Continued weight loss  using a low glycemic index diet and regular exercise a minimum of 5 days per week.

## 2016-03-02 NOTE — Assessment & Plan Note (Signed)
Improving with adherence to Herbalife Diet which he has been using for the past 5 months . He has microscopic proteinuria and lisinopril has been prescribed.    Foot exam is normal and eye exam advised.  He is using glipizide and 70/30 insulin and has reduced  the evening dose to 110 units due to recurrent hypoglycemic events in the early morning.   Lab Results  Component Value Date   HGBA1C 7.4 (H) 02/22/2016   Lab Results  Component Value Date   MICROALBUR 2.0 (H) 11/17/2015

## 2016-03-02 NOTE — Assessment & Plan Note (Signed)
Managed with low GI diet  for improved triglycerides, but LDL is not at goal .  He did not tolerate fenofibrate due to muscle aches and is not interested in statin therapy at this time.   Lab Results  Component Value Date   CHOL 211 (H) 02/22/2016   HDL 25.90 (L) 02/22/2016   LDLCALC 159 (H) 02/22/2016   LDLDIRECT 158.0 02/22/2016   TRIG 133.0 02/22/2016   CHOLHDL 8 02/22/2016

## 2016-03-02 NOTE — Assessment & Plan Note (Signed)
Due to multiple herniated disks causing spinal stenosis.  He is unable to work , unable to collect disability ,  And defers Neurosurgical evaluation and referral to Pain clinic.  Trial  of tramadol and gabapentin

## 2016-03-13 ENCOUNTER — Other Ambulatory Visit: Payer: Self-pay | Admitting: Internal Medicine

## 2016-05-03 ENCOUNTER — Other Ambulatory Visit: Payer: Self-pay | Admitting: Internal Medicine

## 2016-05-30 ENCOUNTER — Other Ambulatory Visit: Payer: 59

## 2016-06-03 ENCOUNTER — Ambulatory Visit: Payer: 59 | Admitting: Internal Medicine

## 2016-06-04 ENCOUNTER — Other Ambulatory Visit: Payer: Self-pay | Admitting: Internal Medicine

## 2016-08-23 ENCOUNTER — Telehealth: Payer: Self-pay | Admitting: Radiology

## 2016-08-23 DIAGNOSIS — E1121 Type 2 diabetes mellitus with diabetic nephropathy: Secondary | ICD-10-CM

## 2016-08-23 DIAGNOSIS — I10 Essential (primary) hypertension: Secondary | ICD-10-CM

## 2016-08-23 DIAGNOSIS — E785 Hyperlipidemia, unspecified: Secondary | ICD-10-CM

## 2016-08-23 NOTE — Telephone Encounter (Signed)
Pt coming in for labs Monday, please place orders. Thank you.

## 2016-08-24 NOTE — Addendum Note (Signed)
Addended by: [name] on: 08/24/2016 06:47 AM   Modules accepted: Orders

## 2016-08-24 NOTE — Telephone Encounter (Signed)
ordered

## 2016-08-26 ENCOUNTER — Other Ambulatory Visit (INDEPENDENT_AMBULATORY_CARE_PROVIDER_SITE_OTHER): Payer: 59

## 2016-08-26 DIAGNOSIS — E785 Hyperlipidemia, unspecified: Secondary | ICD-10-CM

## 2016-08-26 DIAGNOSIS — I10 Essential (primary) hypertension: Secondary | ICD-10-CM

## 2016-08-26 DIAGNOSIS — E1121 Type 2 diabetes mellitus with diabetic nephropathy: Secondary | ICD-10-CM | POA: Diagnosis not present

## 2016-08-26 LAB — COMPREHENSIVE METABOLIC PANEL
ALT: 16 U/L (ref 0–53)
AST: 10 U/L (ref 0–37)
Albumin: 4.2 g/dL (ref 3.5–5.2)
Alkaline Phosphatase: 59 U/L (ref 39–117)
BILIRUBIN TOTAL: 0.3 mg/dL (ref 0.2–1.2)
BUN: 17 mg/dL (ref 6–23)
CALCIUM: 9.3 mg/dL (ref 8.4–10.5)
CHLORIDE: 105 meq/L (ref 96–112)
CO2: 28 meq/L (ref 19–32)
Creatinine, Ser: 0.78 mg/dL (ref 0.40–1.50)
GFR: 110.89 mL/min (ref >60.00)
Glucose, Bld: 95 mg/dL (ref 70–99)
POTASSIUM: 4.6 meq/L (ref 3.5–5.1)
Sodium: 140 mEq/L (ref 135–145)
Total Protein: 6.8 g/dL (ref 6.0–8.3)

## 2016-08-26 LAB — LDL CHOLESTEROL, DIRECT: LDL DIRECT: 170 mg/dL

## 2016-08-26 LAB — LIPID PANEL
CHOLESTEROL: 244 mg/dL — AB (ref 0–200)
HDL: 31 mg/dL — ABNORMAL LOW (ref >39.00)
NonHDL: 212.85
Total CHOL/HDL Ratio: 8
Triglycerides: 218 mg/dL — ABNORMAL HIGH (ref 0.0–149.0)
VLDL: 43.6 mg/dL — AB (ref 0.0–40.0)

## 2016-08-26 LAB — MICROALBUMIN / CREATININE URINE RATIO
CREATININE, U: 192.8 mg/dL
MICROALB/CREAT RATIO: 2.1 mg/g (ref 0.0–30.0)
Microalb, Ur: 4.1 mg/dL — ABNORMAL HIGH (ref 0.0–1.9)

## 2016-08-26 LAB — HEMOGLOBIN A1C: Hgb A1c MFr Bld: 9.5 % — ABNORMAL HIGH (ref 4.6–6.5)

## 2016-08-28 ENCOUNTER — Encounter: Payer: Self-pay | Admitting: Internal Medicine

## 2016-08-30 ENCOUNTER — Ambulatory Visit (INDEPENDENT_AMBULATORY_CARE_PROVIDER_SITE_OTHER): Payer: 59 | Admitting: Internal Medicine

## 2016-08-30 ENCOUNTER — Encounter: Payer: Self-pay | Admitting: Internal Medicine

## 2016-08-30 DIAGNOSIS — I10 Essential (primary) hypertension: Secondary | ICD-10-CM | POA: Diagnosis not present

## 2016-08-30 DIAGNOSIS — Z794 Long term (current) use of insulin: Secondary | ICD-10-CM | POA: Diagnosis not present

## 2016-08-30 DIAGNOSIS — E1165 Type 2 diabetes mellitus with hyperglycemia: Secondary | ICD-10-CM

## 2016-08-30 DIAGNOSIS — IMO0001 Reserved for inherently not codable concepts without codable children: Secondary | ICD-10-CM

## 2016-08-30 DIAGNOSIS — E1121 Type 2 diabetes mellitus with diabetic nephropathy: Secondary | ICD-10-CM | POA: Diagnosis not present

## 2016-08-30 NOTE — Patient Instructions (Addendum)
YOUR EVENING INSULIN DOSE NEEDS  TO BE TAKEN  BEFORE YOU SIT DOWN TO EAT DINNER   CHECK SUGARS 2 HOURS AFTER DINNER AND RECORD FOR ONE WEEK ,  SEND ME THOSE READINGS.  You are due for colon cancer screening,  Either with a colonoscopy or with the home stool  test called cologuard   Check with your insurance about which form of screening they will cover .

## 2016-08-30 NOTE — Progress Notes (Signed)
Pre visit review using our clinic review tool, if applicable. No additional management support is needed unless otherwise documented below in the visit note. 

## 2016-08-30 NOTE — Progress Notes (Signed)
Subjective:  Patient ID: Connor Ross, male    DOB: 1966/06/06  Age: 51 y.o. MRN: [id number]  CC: Diagnoses of Uncontrolled type 2 diabetes mellitus without complication, with long-term current use of insulin ([place]), Essential hypertension, and Diabetic nephropathy associated with type 2 diabetes mellitus ([place]) were pertinent to this visit.  HPI Connor Ross presents for follow up on uncontrolled diabetes.  Was asked to bring his blood sugar log but forgot.   HE REPORTS COMPLIANCE WITH use of insulin twice daily .  He Is taking  75/25 insulin  30 units in the am and 120 units in the evening .  Giving himself the evening dose  AFTER DINNER AND SUGARS HAVE BEEN 240 TO 300'S.    Fasting sugars 124 and 122  For the past two days,  Did not bring the sugars with him but states that the other random sugars are in the 90's ,  The highest he can remember  is 140   HE has constant pain involving his neck, and BACK AND states that he CANNOT AFFORD SURGERY because he is out work. Does not want narcotics.   USING TRAMADOL SPARINGLY,  DOES NOT WANT OPIOIDS,  USING ESSENTIAL OILS,  PRN IBUPROFEN TO TAKE THE EDGE OFF.  NEVER TRIED THE GABapentin or voltaren  Having short term memory lapses.   MMSE normal    Lab Results  Component Value Date   HGBA1C 9.5 (H) 08/26/2016     Outpatient Medications Prior to Visit  Medication Sig Dispense Refill  . glipiZIDE (GLUCOTROL) 10 MG tablet TAKE 1 TABLET BY MOUTH 2  TIMES DAILY BEFORE A MEAL. 180 tablet 1  . glucose blood test strip For use twice daily  For diabetes testing   For uncontrolled diabetes 100 each 6  . HUMALOG MIX 75/25 KWIKPEN (75-25) 100 UNIT/ML Kwikpen INJECT 30 UNITS IN THE  MORNING AND 120 UNITS IN  THE EVENING PRE MEAL 135 mL 2  . Insulin Pen Needle 32G X 6 MM MISC Use daily with insulin pen 50 each 0  . Lancets (FREESTYLE) lancets 1 each by Other route 2 (two) times daily. DX 250.02 100 each 12  . lisinopril  (PRINIVIL,ZESTRIL) 40 MG tablet TAKE 1 TABLET BY MOUTH  DAILY 90 tablet 1  . traMADol (ULTRAM) 50 MG tablet Take 1 tablet (50 mg total) by mouth every 6 (six) hours as needed. 120 tablet 2  . diclofenac (VOLTAREN) 75 MG EC tablet Take 1 tablet (75 mg total) by mouth 2 (two) times daily. (Patient not taking: Reported on 08/30/2016) 60 tablet 2  . gabapentin (NEURONTIN) 300 MG capsule Take 1 capsule (300 mg total) by mouth at bedtime. (Patient not taking: Reported on 08/30/2016) 30 capsule 1   No facility-administered medications prior to visit.     Review of Systems;  Patient denies headache, fevers, malaise, unintentional weight loss, skin rash, eye pain, sinus congestion and sinus pain, sore throat, dysphagia,  hemoptysis , cough, dyspnea, wheezing, chest pain, palpitations, orthopnea, edema, abdominal pain, nausea, melena, diarrhea, constipation, flank pain, dysuria, hematuria, urinary  Frequency, nocturia, numbness, tingling, seizures,  Focal weakness, Loss of consciousness,  Tremor, insomnia, depression, anxiety, and suicidal ideation.      Objective:  BP 120/78   Pulse 89   Resp 16   Wt (!) 330 lb (149.7 kg)   SpO2 96%   BMI 44.76 kg/m   BP Readings from Last 3 Encounters:  08/30/16 120/78  02/28/16 124/72  11/22/15 138/72  Wt Readings from Last 3 Encounters:  08/30/16 (!) 330 lb (149.7 kg)  02/28/16 (!) 309 lb 4 oz (140.3 kg)  11/22/15 (!) 317 lb 4 oz (143.9 kg)    General appearance: obese. alert, cooperative and appears stated age Ears: normal TM's and external ear canals both ears Throat: lips, mucosa, and tongue normal; teeth and gums normal Neck: no adenopathy, no carotid bruit, supple, symmetrical, trachea midline and thyroid not enlarged, symmetric, no tenderness/mass/nodules Back: symmetric, no curvature. ROM normal. No CVA tenderness. Lungs: clear to auscultation bilaterally Heart: regular rate and rhythm, S1, S2 normal, no murmur, click, rub or  gallop Abdomen: soft, non-tender; bowel sounds normal; no masses,  no organomegaly Pulses: 2+ and symmetric Skin: Skin color, texture, turgor normal. No rashes or lesions Lymph nodes: Cervical, supraclavicular, and axillary nodes normal. Psych: affect normal, makes good eye contact. No fidgeting,  Smiles easily.  Denies suicidal thoughts    Lab Results  Component Value Date   HGBA1C 9.5 (H) 08/26/2016   HGBA1C 7.4 (H) 02/22/2016   HGBA1C 9.4 (H) 11/17/2015    Lab Results  Component Value Date   CREATININE 0.78 08/26/2016   CREATININE 0.78 02/22/2016   CREATININE 0.81 11/17/2015    Lab Results  Component Value Date   WBC 7.2 02/06/2015   HGB 14.6 02/06/2015   HCT 43.6 02/06/2015   PLT 241.0 02/06/2015   GLUCOSE 95 08/26/2016   CHOL 244 (H) 08/26/2016   TRIG 218.0 (H) 08/26/2016   HDL 31.00 (L) 08/26/2016   LDLDIRECT 170.0 08/26/2016   LDLCALC 159 (H) 02/22/2016   ALT 16 08/26/2016   AST 10 08/26/2016   NA 140 08/26/2016   K 4.6 08/26/2016   CL 105 08/26/2016   CREATININE 0.78 08/26/2016   BUN 17 08/26/2016   CO2 28 08/26/2016   TSH 3.28 05/10/2015   HGBA1C 9.5 (H) 08/26/2016   MICROALBUR 4.1 (H) 08/26/2016     Assessment & Plan:   Problem List Items Addressed This Visit    Diabetic nephropathy ([place])    . He has microscopic proteinuria and lisinopril has been prescribed.    Foot exam is normal and eye exam advised.  He is using glipizide and 75/25 insulin   Lab Results  Component Value Date   HGBA1C 9.5 (H) 08/26/2016   Lab Results  Component Value Date   MICROALBUR 4.1 (H) 08/26/2016         Relevant Medications   aspirin EC 81 MG tablet   DM (diabetes mellitus), type 2, uncontrolled ([facility])    Loss of control since stopping the Herbalife Diet which he hae been using for the past 5 months.  No medication changes were made bc he is taking his insulin at the wrong time,  Advised to use myChart to send post prandial readings in one week .  Would benefit  from jardiance if insurance will cover . Advised to start taking a baby aspirin   Lab Results  Component Value Date   HGBA1C 9.5 (H) 08/26/2016         Relevant Medications   aspirin EC 81 MG tablet   Hypertension    Well controlled on current regimen. Renal function stable, no changes today.  Lab Results  Component Value Date   CREATININE 0.78 08/26/2016   Lab Results  Component Value Date   NA 140 08/26/2016   K 4.6 08/26/2016   CL 105 08/26/2016   CO2 28 08/26/2016  Relevant Medications   aspirin EC 81 MG tablet     A total of 25 minutes of face to face time was spent with patient more than half of which was spent in counselling about the above mentioned conditions  and coordination of care  I have discontinued Connor Ross's diclofenac and gabapentin. I am also having him start on aspirin EC. Additionally, I am having him maintain his freestyle, Insulin Pen Needle, glucose blood, traMADol, HUMALOG MIX 75/25 KWIKPEN, glipiZIDE, and lisinopril.  Meds ordered this encounter  Medications  . aspirin EC 81 MG tablet    Sig: Take 1 tablet (81 mg total) by mouth daily.    Dispense:  90 tablet    Refill:  2    Medications Discontinued During This Encounter  Medication Reason  . gabapentin (NEURONTIN) 300 MG capsule   . diclofenac (VOLTAREN) 75 MG EC tablet     Follow-up: Return in about 3 months (around 11/27/2016) for follow up diabetes.   [name], MD

## 2016-09-01 MED ORDER — ASPIRIN EC 81 MG PO TBEC: 81.0000 mg | DELAYED_RELEASE_TABLET | Freq: Every day | ORAL | 2 refills | Status: DC

## 2016-09-01 NOTE — Assessment & Plan Note (Signed)
.   He has microscopic proteinuria and lisinopril has been prescribed.    Foot exam is normal and eye exam advised.  He is using glipizide and 75/25 insulin   Lab Results  Component Value Date   HGBA1C 9.5 (H) 08/26/2016   Lab Results  Component Value Date   MICROALBUR 4.1 (H) 08/26/2016

## 2016-09-01 NOTE — Assessment & Plan Note (Signed)
Well controlled on current regimen. Renal function stable, no changes today.  Lab Results  Component Value Date   CREATININE 0.78 08/26/2016   Lab Results  Component Value Date   NA 140 08/26/2016   K 4.6 08/26/2016   CL 105 08/26/2016   CO2 28 08/26/2016

## 2016-09-01 NOTE — Assessment & Plan Note (Addendum)
Loss of control since stopping the Herbalife Diet which he hae been using for the past 5 months.  No medication changes were made bc he is taking his insulin at the wrong time,  Advised to use myChart to send post prandial readings in one week .  Would benefit from jardiance if insurance will cover . Advised to start taking a baby aspirin   Lab Results  Component Value Date   HGBA1C 9.5 (H) 08/26/2016

## 2017-01-22 LAB — HM DIABETES EYE EXAM

## 2017-01-31 ENCOUNTER — Other Ambulatory Visit: Payer: Self-pay | Admitting: Internal Medicine

## 2017-02-28 ENCOUNTER — Telehealth: Payer: Self-pay | Admitting: Radiology

## 2017-02-28 DIAGNOSIS — Z794 Long term (current) use of insulin: Principal | ICD-10-CM

## 2017-02-28 DIAGNOSIS — E1121 Type 2 diabetes mellitus with diabetic nephropathy: Secondary | ICD-10-CM

## 2017-02-28 DIAGNOSIS — IMO0001 Reserved for inherently not codable concepts without codable children: Secondary | ICD-10-CM

## 2017-02-28 DIAGNOSIS — E785 Hyperlipidemia, unspecified: Secondary | ICD-10-CM

## 2017-02-28 DIAGNOSIS — E1165 Type 2 diabetes mellitus with hyperglycemia: Principal | ICD-10-CM

## 2017-02-28 NOTE — Telephone Encounter (Signed)
Pt coming in Monday for labs, please place future orders. Thank you.  

## 2017-02-28 NOTE — Addendum Note (Signed)
Addended by: [name] on: 02/28/2017 12:00 PM   Modules accepted: Orders

## 2017-03-03 ENCOUNTER — Other Ambulatory Visit (INDEPENDENT_AMBULATORY_CARE_PROVIDER_SITE_OTHER): Payer: 59

## 2017-03-03 DIAGNOSIS — Z794 Long term (current) use of insulin: Secondary | ICD-10-CM

## 2017-03-03 DIAGNOSIS — E1165 Type 2 diabetes mellitus with hyperglycemia: Secondary | ICD-10-CM | POA: Diagnosis not present

## 2017-03-03 DIAGNOSIS — IMO0001 Reserved for inherently not codable concepts without codable children: Secondary | ICD-10-CM

## 2017-03-03 DIAGNOSIS — E1121 Type 2 diabetes mellitus with diabetic nephropathy: Secondary | ICD-10-CM

## 2017-03-03 DIAGNOSIS — E785 Hyperlipidemia, unspecified: Secondary | ICD-10-CM | POA: Diagnosis not present

## 2017-03-03 LAB — LIPID PANEL
Cholesterol: 225 mg/dL — ABNORMAL HIGH (ref 0–200)
HDL: 24.4 mg/dL — AB (ref >39.00)
NonHDL: 200.29
TRIGLYCERIDES: 235 mg/dL — AB (ref 0.0–149.0)
Total CHOL/HDL Ratio: 9
VLDL: 47 mg/dL — ABNORMAL HIGH (ref 0.0–40.0)

## 2017-03-03 LAB — MICROALBUMIN / CREATININE URINE RATIO
Creatinine,U: 261.8 mg/dL
MICROALB UR: 10 mg/dL — AB (ref 0.0–1.9)
Microalb Creat Ratio: 3.8 mg/g (ref 0.0–30.0)

## 2017-03-03 LAB — COMPREHENSIVE METABOLIC PANEL
ALBUMIN: 3.9 g/dL (ref 3.5–5.2)
ALT: 17 U/L (ref 0–53)
AST: 12 U/L (ref 0–37)
Alkaline Phosphatase: 62 U/L (ref 39–117)
BUN: 11 mg/dL (ref 6–23)
CHLORIDE: 104 meq/L (ref 96–112)
CO2: 24 mEq/L (ref 19–32)
Calcium: 9.2 mg/dL (ref 8.4–10.5)
Creatinine, Ser: 0.82 mg/dL (ref 0.40–1.50)
GFR: 104.46 mL/min (ref >60.00)
Glucose, Bld: 124 mg/dL — ABNORMAL HIGH (ref 70–99)
POTASSIUM: 3.9 meq/L (ref 3.5–5.1)
SODIUM: 137 meq/L (ref 135–145)
Total Bilirubin: 0.4 mg/dL (ref 0.2–1.2)
Total Protein: 7.2 g/dL (ref 6.0–8.3)

## 2017-03-03 LAB — LDL CHOLESTEROL, DIRECT: LDL DIRECT: 153 mg/dL

## 2017-03-03 LAB — HEMOGLOBIN A1C: HEMOGLOBIN A1C: 9.5 % — AB (ref 4.6–6.5)

## 2017-03-05 ENCOUNTER — Encounter: Payer: Self-pay | Admitting: Internal Medicine

## 2017-03-07 ENCOUNTER — Ambulatory Visit: Payer: 59 | Admitting: Internal Medicine

## 2017-03-12 ENCOUNTER — Encounter: Payer: Self-pay | Admitting: Internal Medicine

## 2017-03-12 ENCOUNTER — Ambulatory Visit (INDEPENDENT_AMBULATORY_CARE_PROVIDER_SITE_OTHER): Payer: 59 | Admitting: Internal Medicine

## 2017-03-12 VITALS — BP 120/68 | HR 86 | Temp 98.3°F | Resp 15 | Ht 72.0 in | Wt 337.8 lb

## 2017-03-12 DIAGNOSIS — Z125 Encounter for screening for malignant neoplasm of prostate: Secondary | ICD-10-CM | POA: Diagnosis not present

## 2017-03-12 DIAGNOSIS — Z794 Long term (current) use of insulin: Secondary | ICD-10-CM

## 2017-03-12 DIAGNOSIS — Z6841 Body Mass Index (BMI) 40.0 and over, adult: Secondary | ICD-10-CM

## 2017-03-12 DIAGNOSIS — E1121 Type 2 diabetes mellitus with diabetic nephropathy: Secondary | ICD-10-CM

## 2017-03-12 DIAGNOSIS — M5416 Radiculopathy, lumbar region: Secondary | ICD-10-CM | POA: Diagnosis not present

## 2017-03-12 DIAGNOSIS — M48061 Spinal stenosis, lumbar region without neurogenic claudication: Secondary | ICD-10-CM

## 2017-03-12 DIAGNOSIS — I152 Hypertension secondary to endocrine disorders: Secondary | ICD-10-CM | POA: Diagnosis not present

## 2017-03-12 DIAGNOSIS — E785 Hyperlipidemia, unspecified: Secondary | ICD-10-CM | POA: Diagnosis not present

## 2017-03-12 DIAGNOSIS — E1165 Type 2 diabetes mellitus with hyperglycemia: Secondary | ICD-10-CM

## 2017-03-12 DIAGNOSIS — IMO0001 Reserved for inherently not codable concepts without codable children: Secondary | ICD-10-CM

## 2017-03-12 NOTE — Progress Notes (Signed)
Subjective:  Patient ID: Connor Ross, male    DOB: 23-May-1966  Age: 51 y.o. MRN: [id number]  CC: The primary encounter diagnosis was Prostate cancer screening. Diagnoses of Diabetic nephropathy associated with type 2 diabetes mellitus ([place]), Uncontrolled type 2 diabetes mellitus without complication, with long-term current use of insulin ([place]), Morbid obesity with BMI of 45.0-49.9, adult [facility]), Spinal stenosis of lumbar region with radiculopathy, Hypertension due to endocrine disorder, and Hyperlipidemia with target LDL less than 70 were also pertinent to this visit.  HPI Connor Ross presents for FOLLOW UP ON uncontrolled type 2 Diabetes.  His a1c remains unchanged from February and is 9.5.  He was saked to bring his BS log with him .  Has only recorded fasting sugars for the last month,  All written in the same ink.  Has been adjusting his doses of insulin based on his pre meal CBGs and whether he is skipping a meal or not.  Total daily dose of insulin (75/25) ranges from 150 to 200 units.  Baseline dose at last visit was 30 units of 75/25 in the am and 120 units in the evening , along with glipizide 10 mg bid   Patient remains ambivalent about control of diabetes.  States that it is too expensive to "eat the right foods."  Has been unemployed for the past 2 yeas,  Cannot collect disability, so feels guilty about having to survive on wife's income.  Drinks herbal life shake occasionally for breakfast. Too $$$   Skips at least one meal daily, snacks on chicken salad on WAS[employer] crackers.   Has numbness and tingling in feet and hands. Has chronic low back pain secondary to severe spinal stenosis from DDD and bulging disks. (Myelogram 2013 noted severe stenosis at L3 to L5 , moderate at L2)   Feet feel swollen all the time.  does not want to try gabapentin.  Does not want to see neurosurgery.  Lack of motivation reviewed and discussed.   several years ago when he injured his back on the  job,  Workmen's comp would not cover the projected cost of surgery ("$180K) .  Current insurance has a very high deductible and he states that he cannot afford the copay on the n/s evaluation.  States that his cost to see me is $300 out of pocket for each visit due to same.     Outpatient Medications Prior to Visit  Medication Sig Dispense Refill  . aspirin EC 81 MG tablet Take 1 tablet (81 mg total) by mouth daily. 90 tablet 2  . glipiZIDE (GLUCOTROL) 10 MG tablet TAKE 1 TABLET BY MOUTH 2  TIMES DAILY BEFORE A MEAL. 180 tablet 1  . glucose blood test strip For use twice daily  For diabetes testing   For uncontrolled diabetes 100 each 6  . HUMALOG MIX 75/25 KWIKPEN (75-25) 100 UNIT/ML Kwikpen INJECT 30 UNITS IN THE  MORNING AND 120 UNITS IN  THE EVENING PRE MEAL 135 mL 1  . Insulin Pen Needle 32G X 6 MM MISC Use daily with insulin pen 50 each 0  . Lancets (FREESTYLE) lancets 1 each by Other route 2 (two) times daily. DX 250.02 100 each 12  . lisinopril (PRINIVIL,ZESTRIL) 40 MG tablet TAKE 1 TABLET BY MOUTH  DAILY 90 tablet 1  . traMADol (ULTRAM) 50 MG tablet Take 1 tablet (50 mg total) by mouth every 6 (six) hours as needed. 120 tablet 2   No facility-administered medications prior to visit.  Review of Systems;  Patient denies headache, fevers, malaise, unintentional weight loss, skin rash, eye pain, sinus congestion and sinus pain, sore throat, dysphagia,  hemoptysis , cough, dyspnea, wheezing, chest pain, palpitations, orthopnea, edema, abdominal pain, nausea, melena, diarrhea, constipation, flank pain, dysuria, hematuria, urinary  Frequency, nocturia, numbness, tingling, seizures,  Focal weakness, Loss of consciousness,  Tremor, insomnia, depression, anxiety, and suicidal ideation.      Objective:  BP 120/68 (BP Location: Left Arm, Patient Position: Sitting, Cuff Size: Large)   Pulse 86   Temp 98.3 F (36.8 C) (Oral)   Resp 15   Ht 6' (1.829 m)   Wt (!) 337 lb 12.8 oz (153.2 kg)    SpO2 96%   BMI 45.81 kg/m   BP Readings from Last 3 Encounters:  03/12/17 120/68  08/30/16 120/78  02/28/16 124/72    Wt Readings from Last 3 Encounters:  03/12/17 (!) 337 lb 12.8 oz (153.2 kg)  08/30/16 (!) 330 lb (149.7 kg)  02/28/16 (!) 309 lb 4 oz (140.3 kg)    General appearance: alert, cooperative and appears stated age Ears: normal TM's and external ear canals both ears Throat: lips, mucosa, and tongue normal; teeth and gums normal Neck: no adenopathy, no carotid bruit, supple, symmetrical, trachea midline and thyroid not enlarged, symmetric, no tenderness/mass/nodules Back: symmetric, no curvature. ROM normal. No CVA tenderness. Lungs: clear to auscultation bilaterally Heart: regular rate and rhythm, S1, S2 normal, no murmur, click, rub or gallop Abdomen: soft, non-tender; bowel sounds normal; no masses,  no organomegaly Pulses: 2+ and symmetric Skin: Skin color, texture, turgor normal. No rashes or lesions Lymph nodes: Cervical, supraclavicular, and axillary nodes normal.  Lab Results  Component Value Date   HGBA1C 9.5 (H) 03/03/2017   HGBA1C 9.5 (H) 08/26/2016   HGBA1C 7.4 (H) 02/22/2016    Lab Results  Component Value Date   CREATININE 0.82 03/03/2017   CREATININE 0.78 08/26/2016   CREATININE 0.78 02/22/2016    Lab Results  Component Value Date   WBC 7.2 02/06/2015   HGB 14.6 02/06/2015   HCT 43.6 02/06/2015   PLT 241.0 02/06/2015   GLUCOSE 124 (H) 03/03/2017   CHOL 225 (H) 03/03/2017   TRIG 235.0 (H) 03/03/2017   HDL 24.40 (L) 03/03/2017   LDLDIRECT 153.0 03/03/2017   LDLCALC 159 (H) 02/22/2016   ALT 17 03/03/2017   AST 12 03/03/2017   NA 137 03/03/2017   K 3.9 03/03/2017   CL 104 03/03/2017   CREATININE 0.82 03/03/2017   BUN 11 03/03/2017   CO2 24 03/03/2017   TSH 3.28 05/10/2015   HGBA1C 9.5 (H) 03/03/2017   MICROALBUR 10.0 (H) 03/03/2017     Assessment & Plan:   Problem List Items Addressed This Visit    Diabetic nephropathy  ([place])    He has microscopic proteinuria and lisinopril has been prescribed.    Foot exam has been done and reminder for eye exam given Lab Results  Component Value Date   HGBA1C 9.5 (H) 03/03/2017   Lab Results  Component Value Date   MICROALBUR 10.0 (H) 03/03/2017         DM (diabetes mellitus), type 2, uncontrolled ([facility])    Fasting blood sugars reviewed and do not reflect the a1c of 9.5 .  Encouraged to check pre and post lunchtime sugars for two weeks to determine if 75/25 morning dose should be increased , or mealtime insulin should be added.  Encouraged to use Mychart in between visits to improve control .  Will make referral to pharm D      Relevant Orders   Hemoglobin A1c   Comprehensive metabolic panel   Lipid panel   Hyperlipidemia with target LDL less than 70    Managed with low GI diet  Only due to fenofibrate intolerance and refusal to initiate statin therapy. Continue asa for primary prevention of ASCVD .  Risk is > 25% Lab Results  Component Value Date   CHOL 225 (H) 03/03/2017   HDL 24.40 (L) 03/03/2017   LDLCALC 159 (H) 02/22/2016   LDLDIRECT 153.0 03/03/2017   TRIG 235.0 (H) 03/03/2017   CHOLHDL 9 03/03/2017             Hypertension    Well controlled on current regimen which includes lisinopril for nephropathy. Renal function stable, no changes today.  Lab Results  Component Value Date   CREATININE 0.82 03/03/2017   Lab Results  Component Value Date   NA 137 03/03/2017   K 3.9 03/03/2017   CL 104 03/03/2017   CO2 24 03/03/2017   Lab Results  Component Value Date   MICROALBUR 10.0 (H) 03/03/2017         Morbid obesity with BMI of 45.0-49.9, adult [facility])    Reviewed his previous success at weight loss using Herbal Life Products, which he states he can no longer afford.  I have addressed  BMI and recommended wt loss of 10% of body weigh over the next 6 months using a low glycemic index diet and regular exercises tolerated      Spinal  stenosis of lumbar region with radiculopathy    Secondary to workmen's comp injury .  Confirmed in 2013 with N/S consult and myelogram.  Surgery has been deferred by patient due to cost.        Other Visit Diagnoses    Prostate cancer screening    -  Primary   Relevant Orders   PSA      I am having Mr. Behringer maintain his freestyle, Insulin Pen Needle, glucose blood, traMADol, aspirin EC, HUMALOG MIX 75/25 KWIKPEN, lisinopril, and glipiZIDE.  No orders of the defined types were placed in this encounter.   There are no discontinued medications.  Follow-up: Return in about 3 months (around 06/12/2017) for follow up diabetes.   [name], MD

## 2017-03-12 NOTE — Patient Instructions (Addendum)
Your mornign sugars are really not bad  There must be anogher time during the day when you are running high a lot.  Lets' focus on lunch sugars for the next two weeks:   Check a pre lunch and a 2 hr post lunch blood sugar daily  Continue the 120 units before your dinner meal and 30 units before breakfast.    If you are going to skip your breakfast,  Reduce the dose of insulin unless BS is 160 or higher (do half if sugars over 120 less than 160)   Submit the BS by e mail or drop it off an I will adjust your insulin IN BETWEEN VISITS AT NO COST TO YOU

## 2017-03-13 NOTE — Assessment & Plan Note (Signed)
Managed with low GI diet  Only due to fenofibrate intolerance and refusal to initiate statin therapy. Continue asa for primary prevention of ASCVD .  Risk is > 25% Lab Results  Component Value Date   CHOL 225 (H) 03/03/2017   HDL 24.40 (L) 03/03/2017   LDLCALC 159 (H) 02/22/2016   LDLDIRECT 153.0 03/03/2017   TRIG 235.0 (H) 03/03/2017   CHOLHDL 9 03/03/2017

## 2017-03-13 NOTE — Assessment & Plan Note (Signed)
Reviewed his previous success at weight loss using Herbal Life Products, which he states he can no longer afford.  I have addressed  BMI and recommended wt loss of 10% of body weigh over the next 6 months using a low glycemic index diet and regular exercises tolerated

## 2017-03-13 NOTE — Assessment & Plan Note (Signed)
Fasting blood sugars reviewed and do not reflect the a1c of 9.5 .  Encouraged to check pre and post lunchtime sugars for two weeks to determine if 75/25 morning dose should be increased , or mealtime insulin should be added.  Encouraged to use Mychart in between visits to improve control .  Will make referral to pharm D

## 2017-03-13 NOTE — Assessment & Plan Note (Signed)
Well controlled on current regimen which includes lisinopril for nephropathy. Renal function stable, no changes today.  Lab Results  Component Value Date   CREATININE 0.82 03/03/2017   Lab Results  Component Value Date   NA 137 03/03/2017   K 3.9 03/03/2017   CL 104 03/03/2017   CO2 24 03/03/2017   Lab Results  Component Value Date   MICROALBUR 10.0 (H) 03/03/2017

## 2017-03-13 NOTE — Assessment & Plan Note (Signed)
Secondary to workmen's comp injury .  Confirmed in 2013 with N/S consult and myelogram.  Surgery has been deferred by patient due to cost.

## 2017-03-13 NOTE — Assessment & Plan Note (Signed)
He has microscopic proteinuria and lisinopril has been prescribed.    Foot exam has been done and reminder for eye exam given Lab Results  Component Value Date   HGBA1C 9.5 (H) 03/03/2017   Lab Results  Component Value Date   MICROALBUR 10.0 (H) 03/03/2017

## 2017-07-08 ENCOUNTER — Other Ambulatory Visit: Payer: Self-pay | Admitting: Internal Medicine

## 2017-07-15 ENCOUNTER — Other Ambulatory Visit: Payer: Self-pay | Admitting: Internal Medicine

## 2017-07-21 ENCOUNTER — Other Ambulatory Visit: Payer: Self-pay | Admitting: Internal Medicine

## 2017-09-20 ENCOUNTER — Other Ambulatory Visit: Payer: Self-pay | Admitting: Internal Medicine

## 2017-09-22 NOTE — Telephone Encounter (Signed)
Refilled: 07/09/2017 Last OV: 03/12/2017 Next OV: not scheduled

## 2017-11-11 ENCOUNTER — Other Ambulatory Visit: Payer: Self-pay

## 2017-11-11 MED ORDER — ASPIRIN 81 MG PO TBEC: 81.0000 mg | DELAYED_RELEASE_TABLET | Freq: Every day | ORAL | 0 refills | Status: DC

## 2017-11-27 ENCOUNTER — Other Ambulatory Visit: Payer: Self-pay | Admitting: Internal Medicine

## 2017-12-19 ENCOUNTER — Other Ambulatory Visit: Payer: Self-pay | Admitting: Internal Medicine

## 2018-02-03 ENCOUNTER — Other Ambulatory Visit: Payer: Self-pay | Admitting: Internal Medicine

## 2018-02-03 NOTE — Telephone Encounter (Signed)
Refilled: 11/27/2017 Last OV: 03/12/2017 Next OV: not scheduled

## 2018-02-04 ENCOUNTER — Telehealth: Payer: Self-pay | Admitting: Internal Medicine

## 2018-02-04 DIAGNOSIS — E1165 Type 2 diabetes mellitus with hyperglycemia: Secondary | ICD-10-CM

## 2018-02-04 NOTE — Telephone Encounter (Signed)
Insulin refilled ,  Needs appt.  Please schedule.  Labs ordered, to be done prior to visit .

## 2018-02-05 NOTE — Telephone Encounter (Signed)
Pt has read mychart message.

## 2018-03-03 ENCOUNTER — Other Ambulatory Visit: Payer: Self-pay | Admitting: Internal Medicine

## 2018-03-20 ENCOUNTER — Other Ambulatory Visit (INDEPENDENT_AMBULATORY_CARE_PROVIDER_SITE_OTHER): Payer: 59

## 2018-03-20 DIAGNOSIS — E1165 Type 2 diabetes mellitus with hyperglycemia: Secondary | ICD-10-CM | POA: Diagnosis not present

## 2018-03-20 LAB — LIPID PANEL
CHOL/HDL RATIO: 9
CHOLESTEROL: 223 mg/dL — AB (ref 0–200)
HDL: 25.5 mg/dL — AB (ref >39.00)
NonHDL: 197.94
TRIGLYCERIDES: 256 mg/dL — AB (ref 0.0–149.0)
VLDL: 51.2 mg/dL — AB (ref 0.0–40.0)

## 2018-03-20 LAB — HEMOGLOBIN A1C: HEMOGLOBIN A1C: 10.3 % — AB (ref 4.6–6.5)

## 2018-03-20 LAB — COMPREHENSIVE METABOLIC PANEL
ALT: 15 U/L (ref 0–53)
AST: 10 U/L (ref 0–37)
Albumin: 4.3 g/dL (ref 3.5–5.2)
Alkaline Phosphatase: 75 U/L (ref 39–117)
BUN: 14 mg/dL (ref 6–23)
CALCIUM: 9.5 mg/dL (ref 8.4–10.5)
CHLORIDE: 100 meq/L (ref 96–112)
CO2: 30 meq/L (ref 19–32)
CREATININE: 0.86 mg/dL (ref 0.40–1.50)
GFR: 98.48 mL/min (ref >60.00)
Glucose, Bld: 165 mg/dL — ABNORMAL HIGH (ref 70–99)
POTASSIUM: 4.4 meq/L (ref 3.5–5.1)
Sodium: 137 mEq/L (ref 135–145)
Total Bilirubin: 0.4 mg/dL (ref 0.2–1.2)
Total Protein: 7.4 g/dL (ref 6.0–8.3)

## 2018-03-20 LAB — MICROALBUMIN / CREATININE URINE RATIO
CREATININE, U: 169.2 mg/dL
MICROALB/CREAT RATIO: 1.9 mg/g (ref 0.0–30.0)
Microalb, Ur: 3.3 mg/dL — ABNORMAL HIGH (ref 0.0–1.9)

## 2018-03-20 LAB — LDL CHOLESTEROL, DIRECT: Direct LDL: 157 mg/dL

## 2018-03-25 ENCOUNTER — Encounter: Payer: Self-pay | Admitting: Internal Medicine

## 2018-03-25 ENCOUNTER — Ambulatory Visit (INDEPENDENT_AMBULATORY_CARE_PROVIDER_SITE_OTHER): Payer: 59 | Admitting: Internal Medicine

## 2018-03-25 VITALS — BP 130/68 | HR 83 | Temp 98.1°F | Ht 71.0 in | Wt 323.2 lb

## 2018-03-25 DIAGNOSIS — M48061 Spinal stenosis, lumbar region without neurogenic claudication: Secondary | ICD-10-CM | POA: Diagnosis not present

## 2018-03-25 DIAGNOSIS — E1129 Type 2 diabetes mellitus with other diabetic kidney complication: Secondary | ICD-10-CM

## 2018-03-25 DIAGNOSIS — M205X1 Other deformities of toe(s) (acquired), right foot: Secondary | ICD-10-CM | POA: Diagnosis not present

## 2018-03-25 DIAGNOSIS — Z0000 Encounter for general adult medical examination without abnormal findings: Secondary | ICD-10-CM

## 2018-03-25 DIAGNOSIS — C61 Malignant neoplasm of prostate: Secondary | ICD-10-CM

## 2018-03-25 DIAGNOSIS — M205X2 Other deformities of toe(s) (acquired), left foot: Secondary | ICD-10-CM

## 2018-03-25 DIAGNOSIS — Z1211 Encounter for screening for malignant neoplasm of colon: Secondary | ICD-10-CM

## 2018-03-25 DIAGNOSIS — G4733 Obstructive sleep apnea (adult) (pediatric): Secondary | ICD-10-CM

## 2018-03-25 DIAGNOSIS — R809 Proteinuria, unspecified: Secondary | ICD-10-CM

## 2018-03-25 DIAGNOSIS — M153 Secondary multiple arthritis: Secondary | ICD-10-CM

## 2018-03-25 DIAGNOSIS — E1165 Type 2 diabetes mellitus with hyperglycemia: Secondary | ICD-10-CM | POA: Diagnosis not present

## 2018-03-25 DIAGNOSIS — Z9989 Dependence on other enabling machines and devices: Secondary | ICD-10-CM

## 2018-03-25 DIAGNOSIS — E785 Hyperlipidemia, unspecified: Secondary | ICD-10-CM

## 2018-03-25 DIAGNOSIS — M5416 Radiculopathy, lumbar region: Secondary | ICD-10-CM

## 2018-03-25 DIAGNOSIS — L84 Corns and callosities: Secondary | ICD-10-CM

## 2018-03-25 LAB — PSA: PSA: 0.45 ng/mL (ref 0.10–4.00)

## 2018-03-25 MED ORDER — TRAMADOL HCL 50 MG PO TABS: 50.0000 mg | ORAL_TABLET | Freq: Four times a day (QID) | ORAL | 2 refills | Status: DC | PRN

## 2018-03-25 MED ORDER — ASPIRIN 81 MG PO TBEC: 81.0000 mg | DELAYED_RELEASE_TABLET | Freq: Every day | ORAL | 3 refills | Status: DC

## 2018-03-25 MED ORDER — TAMSULOSIN HCL 0.4 MG PO CAPS: 0.4000 mg | ORAL_CAPSULE | Freq: Every day | ORAL | 3 refills | Status: DC

## 2018-03-25 MED ORDER — DICLOFENAC SODIUM 75 MG PO TBEC: 75.0000 mg | DELAYED_RELEASE_TABLET | Freq: Two times a day (BID) | ORAL | 0 refills | Status: DC

## 2018-03-25 MED ORDER — GABAPENTIN 100 MG PO CAPS: 100.0000 mg | ORAL_CAPSULE | Freq: Three times a day (TID) | ORAL | 3 refills | Status: DC

## 2018-03-25 NOTE — Progress Notes (Signed)
Subjective:  Patient ID: Connor Ross, male    DOB: [birth year]  Age: 52 y.o. MRN: [id number]  CC: The primary encounter diagnosis was Uncontrolled type 2 diabetes mellitus with hyperglycemia ([place]). Diagnoses of Spinal stenosis of lumbar region with radiculopathy, Prostate cancer ([place]), Acquired claw toe of both feet, Plantar callus, Post-traumatic osteoarthritis of multiple joints, Microalbuminuria due to type 2 diabetes mellitus ([place]), Colon cancer screening, Encounter for preventive health examination, Hyperlipidemia with target LDL less than 70, and OSA on CPAP were also pertinent to this visit.  HPI [name] presents for annual  preventive exam and follow up on type 2 diabetes uncontrolled.[name]  Has been lost to follow up since August 2018. He remains unemployed and has not been able to obtain SSI for disability .    Lab Results  Component Value Date   HGBA1C 10.3 (H) 03/20/2018    Reviewed meds:  Taking 30 to 50 units of mixed insulin in the morning and 100 units     In the evening of   Mixed insulin 75/25.  Also taking glipizide max dose.  Metformin intolerant.  Can't afford the newer drugs.   Did not bring log   Of sugars,  Only checks it in the morning.    morning sugars have been under 100 for the past 2 weeks  except for today, it  was 188. States that he is following a low GI diet except that this morning he ate some  Kodiak cakes ("power protein waffles)  not sure how many carbs.    Foot exam done  Last eye exam [place] last year   Legs and feet cramping every night.  Water intake plenty,  Has chronic low back pain  Has a list of concerns:   Wants cologuard test  Foot pain from diabetes now spreading to hands.  Dropping things  Due to arthritis and decreased sensation    Plantar's wart on foot left,  Very deep  Wants removal by podiatry  . Has tried to dig it out himself in the past. Also notes that his toes are starting to draw up   Multiple joint pains  .  Was using motrin but not recently. Doesn't think he can take meloxicam   Having urination problems that improve with use of an OTC  prostate supplement .  Stream is weak   Uninsured,  Still trying to get disability  for back issues      Outpatient Medications Prior to Visit  Medication Sig Dispense Refill  . glipiZIDE (GLUCOTROL) 10 MG tablet TAKE 1 TABLET BY MOUTH 2  TIMES DAILY BEFORE MEALS 180 tablet 0  . glucose blood test strip For use twice daily  For diabetes testing   For uncontrolled diabetes 100 each 6  . HUMALOG MIX 75/25 KWIKPEN (75-25) 100 UNIT/ML Kwikpen INJECT SUBCUTANEOUSLY 30  UNITS IN THE MORNING AND  120 UNITS IN THE EVENING  PRE MEAL 135 mL 0  . Insulin Pen Needle 32G X 6 MM MISC Use daily with insulin pen 50 each 0  . Lancets (FREESTYLE) lancets 1 each by Other route 2 (two) times daily. DX 250.02 100 each 12  . lisinopril (PRINIVIL,ZESTRIL) 40 MG tablet TAKE 1 TABLET BY MOUTH  DAILY 90 tablet 0  . traMADol (ULTRAM) 50 MG tablet Take 1 tablet (50 mg total) by mouth every 6 (six) hours as needed. 120 tablet 2  . aspirin (ASPIRIN ADULT LOW STRENGTH) 81 MG EC tablet Take 1 tablet (81  mg total) by mouth daily. Swallow whole. 120 tablet 0   No facility-administered medications prior to visit.     Review of Systems;  Patient denies headache, fevers, malaise, unintentional weight loss, skin rash, eye pain, sinus congestion and sinus pain, sore throat, dysphagia,  hemoptysis , cough, dyspnea, wheezing, chest pain, palpitations, orthopnea, edema, abdominal pain, nausea, melena, diarrhea, constipation, flank pain, dysuria, hematuria, urinary  Frequency, nocturia, numbness, tingling, seizures,  Focal weakness, Loss of consciousness,  Tremor, insomnia, depression, anxiety, and suicidal ideation.      Objective:  BP 130/68   Pulse 83   Temp 98.1 F (36.7 C) (Oral)   Ht 5\' 11"  (1.803 m)   Wt (!) 323 lb 3.2 oz (146.6 kg)   SpO2 93%   BMI 45.08 kg/m   BP Readings from  Last 3 Encounters:  03/25/18 130/68  03/12/17 120/68  08/30/16 120/78    Wt Readings from Last 3 Encounters:  03/25/18 (!) 323 lb 3.2 oz (146.6 kg)  03/12/17 (!) 337 lb 12.8 oz (153.2 kg)  08/30/16 (!) 330 lb (149.7 kg)    General appearance: alert, cooperative and appears stated age Ears: normal TM's and external ear canals both ears Throat: lips, mucosa, and tongue normal; teeth and gums normal Neck: no adenopathy, no carotid bruit, supple, symmetrical, trachea midline and thyroid not enlarged, symmetric, no tenderness/mass/nodules Back: symmetric, no curvature. ROM normal. No CVA tenderness. Lungs: clear to auscultation bilaterally Heart: regular rate and rhythm, S1, S2 normal, no murmur, click, rub or gallop Abdomen: soft, non-tender; bowel sounds normal; no masses,  no organomegaly Pulses: 2+ and symmetric Skin: Skin color, texture, turgor normal. No rashes or lesions Lymph nodes: Cervical, supraclavicular, and axillary nodes normal.  Lab Results  Component Value Date   HGBA1C 10.3 (H) 03/20/2018   HGBA1C 9.5 (H) 03/03/2017   HGBA1C 9.5 (H) 08/26/2016    Lab Results  Component Value Date   CREATININE 0.86 03/20/2018   CREATININE 0.82 03/03/2017   CREATININE 0.78 08/26/2016    Lab Results  Component Value Date   WBC 7.2 02/06/2015   HGB 14.6 02/06/2015   HCT 43.6 02/06/2015   PLT 241.0 02/06/2015   GLUCOSE 165 (H) 03/20/2018   CHOL 223 (H) 03/20/2018   TRIG 256.0 (H) 03/20/2018   HDL 25.50 (L) 03/20/2018   LDLDIRECT 157.0 03/20/2018   LDLCALC 159 (H) 02/22/2016   ALT 15 03/20/2018   AST 10 03/20/2018   NA 137 03/20/2018   K 4.4 03/20/2018   CL 100 03/20/2018   CREATININE 0.86 03/20/2018   BUN 14 03/20/2018   CO2 30 03/20/2018   TSH 3.28 05/10/2015   PSA 0.45 03/25/2018   HGBA1C 10.3 (H) 03/20/2018   MICROALBUR 3.3 (H) 03/20/2018    Assessment & Plan:   Problem List Items Addressed This Visit    Colon cancer screening    He is at average risk   cologuard ordered       DM (diabetes mellitus), type 2, uncontrolled ([place]) - Primary    Secondary to lack of follow up and financial limitations.  He is on  high does of insulin ,and in the past has not been able to afford the newer medications (GLP analogues and SLGT inhibitors).  He is metformin intolerant.  Will need concentrated insulin  referriing to [facility].   Lab Results  Component Value Date   HGBA1C 10.3 (H) 03/20/2018   Lab Results  Component Value Date   MICROALBUR 3.3 (H) 03/20/2018  Relevant Medications   aspirin (ASPIRIN ADULT LOW STRENGTH) 81 MG EC tablet   Other Relevant Orders   Amb Referral to Clinical Pharmacist   Encounter for preventive health examination    Annual comprehensive preventive exam was done as well as an evaluation and management of chronic conditions .  During the course of the visit the patient was educated and counseled about appropriate screening and preventive services including :   lipid analysis with projected  10 year  risk for CAD   , nutrition counseling, colorectal cancer screening, and recommended immunizations.  Printed recommendations for health maintenance screenings was given. ,      Hyperlipidemia with target LDL less than 70    Managed with low GI diet  Only due to fenofibrate intolerance and refusal to initiate statin therapy. Continue asa for primary prevention of ASCVD .  Risk is > 25% Lab Results  Component Value Date   CHOL 223 (H) 03/20/2018   HDL 25.50 (L) 03/20/2018   LDLCALC 159 (H) 02/22/2016   LDLDIRECT 157.0 03/20/2018   TRIG 256.0 (H) 03/20/2018   CHOLHDL 9 03/20/2018             Relevant Medications   aspirin (ASPIRIN ADULT LOW STRENGTH) 81 MG EC tablet   Microalbuminuria due to type 2 diabetes mellitus ([place])    He has microscopic proteinuria .  Continue lisinopril , as he is tolerating it     Foot exam has been done and reminder for eye exam given Lab Results  Component Value Date   HGBA1C  10.3 (H) 03/20/2018   Lab Results  Component Value Date   MICROALBUR 3.3 (H) 03/20/2018         Relevant Medications   aspirin (ASPIRIN ADULT LOW STRENGTH) 81 MG EC tablet   OSA on CPAP    Diagnosed by sleep study. he is wearing her CPAP every night a minimum of 6 hours per night and notes improved daytime wakefulness and decreased fatigue       Osteoarthritis    Trial of diclofenac and tramadol.       Relevant Medications   traMADol (ULTRAM) 50 MG tablet   diclofenac (VOLTAREN) 75 MG EC tablet   aspirin (ASPIRIN ADULT LOW STRENGTH) 81 MG EC tablet   Spinal stenosis of lumbar region with radiculopathy   Relevant Medications   traMADol (ULTRAM) 50 MG tablet    Other Visit Diagnoses    Prostate cancer ([place])       Relevant Medications   aspirin (ASPIRIN ADULT LOW STRENGTH) 81 MG EC tablet   Other Relevant Orders   PSA (Completed)   Acquired claw toe of both feet       Relevant Orders   Ambulatory referral to Podiatry   Plantar callus       Relevant Orders   Ambulatory referral to Podiatry      I am having [name] start on gabapentin, traMADol, diclofenac, and tamsulosin. I am also having him maintain his freestyle, Insulin Pen Needle, glucose blood, traMADol, HUMALOG MIX 75/25 KWIKPEN, glipiZIDE, lisinopril, and aspirin.  Meds ordered this encounter  Medications  . gabapentin (NEURONTIN) 100 MG capsule    Sig: Take 1 capsule (100 mg total) by mouth 3 (three) times daily.    Dispense:  90 capsule    Refill:  3  . traMADol (ULTRAM) 50 MG tablet    Sig: Take 1 tablet (50 mg total) by mouth every 6 (six) hours as needed. For spinal  stenosis    Dispense:  90 tablet    Refill:  2  . diclofenac (VOLTAREN) 75 MG EC tablet    Sig: Take 1 tablet (75 mg total) by mouth 2 (two) times daily.    Dispense:  60 tablet    Refill:  0  . tamsulosin (FLOMAX) 0.4 MG CAPS capsule    Sig: Take 1 capsule (0.4 mg total) by mouth daily.    Dispense:  30 capsule     Refill:  3  . DISCONTD: aspirin (ASPIRIN ADULT LOW STRENGTH) 81 MG EC tablet    Sig: Take 1 tablet (81 mg total) by mouth daily. Swallow whole.    Dispense:  90 tablet    Refill:  3  . aspirin (ASPIRIN ADULT LOW STRENGTH) 81 MG EC tablet    Sig: Take 1 tablet (81 mg total) by mouth daily. Swallow whole.    Dispense:  90 tablet    Refill:  3    Medications Discontinued During This Encounter  Medication Reason  . aspirin (ASPIRIN ADULT LOW STRENGTH) 81 MG EC tablet Reorder  . aspirin (ASPIRIN ADULT LOW STRENGTH) 81 MG EC tablet Reorder    Follow-up: Return in about 6 months (around 09/23/2018).   [name], MD

## 2018-03-25 NOTE — Patient Instructions (Addendum)
I sent tramadol and diclofenac to [facility] for one month supply  To treat your joint pain     I will initiate the order for your annual colon cancer screening  Test.  It is called  Cologuard.  It will be delivered to your house, and you will send off a stool sample in the envelope it provides.    I am prescribing a medication to help shrink your prostate called Flomax (tamsulosin) once daily   I AM REFERRING you to our pharmacist to help get your diabetes under better control Eula Fried )   Referral to [facility] in [place] for painful callous and toes

## 2018-03-28 DIAGNOSIS — M1990 Unspecified osteoarthritis, unspecified site: Secondary | ICD-10-CM | POA: Insufficient documentation

## 2018-03-28 NOTE — Assessment & Plan Note (Signed)
Managed with low GI diet  Only due to fenofibrate intolerance and refusal to initiate statin therapy. Continue asa for primary prevention of ASCVD .  Risk is > 25% Lab Results  Component Value Date   CHOL 223 (H) 03/20/2018   HDL 25.50 (L) 03/20/2018   LDLCALC 159 (H) 02/22/2016   LDLDIRECT 157.0 03/20/2018   TRIG 256.0 (H) 03/20/2018   CHOLHDL 9 03/20/2018

## 2018-03-28 NOTE — Assessment & Plan Note (Addendum)
Secondary to lack of follow up and financial limitations.  He is on  high does of insulin ,and in the past has not been able to afford the newer medications (GLP analogues and SLGT inhibitors).  He is metformin intolerant.  Will need concentrated insulin  referriing to [facility].   Lab Results  Component Value Date   HGBA1C 10.3 (H) 03/20/2018   Lab Results  Component Value Date   MICROALBUR 3.3 (H) 03/20/2018

## 2018-03-28 NOTE — Assessment & Plan Note (Signed)
Annual comprehensive preventive exam was done as well as an evaluation and management of chronic conditions .  During the course of the visit the patient was educated and counseled about appropriate screening and preventive services including :   lipid analysis with projected  10 year  risk for CAD   , nutrition counseling, colorectal cancer screening, and recommended immunizations.  Printed recommendations for health maintenance screenings was given. ,

## 2018-03-28 NOTE — Assessment & Plan Note (Signed)
He has microscopic proteinuria .  Continue lisinopril , as he is tolerating it     Foot exam has been done and reminder for eye exam given Lab Results  Component Value Date   HGBA1C 10.3 (H) 03/20/2018   Lab Results  Component Value Date   MICROALBUR 3.3 (H) 03/20/2018

## 2018-03-28 NOTE — Assessment & Plan Note (Signed)
He is at average risk  cologuard ordered

## 2018-03-28 NOTE — Assessment & Plan Note (Signed)
Trial of diclofenac and tramadol.

## 2018-03-28 NOTE — Assessment & Plan Note (Signed)
Diagnosed by sleep study. he is wearing her CPAP every night a minimum of 6 hours per night and notes improved daytime wakefulness and decreased fatigue  

## 2018-04-06 ENCOUNTER — Ambulatory Visit (INDEPENDENT_AMBULATORY_CARE_PROVIDER_SITE_OTHER): Payer: 59 | Admitting: Pharmacist

## 2018-04-06 ENCOUNTER — Encounter: Payer: Self-pay | Admitting: Pharmacist

## 2018-04-06 VITALS — BP 144/81 | HR 77 | Wt 325.8 lb

## 2018-04-06 DIAGNOSIS — E1165 Type 2 diabetes mellitus with hyperglycemia: Secondary | ICD-10-CM | POA: Diagnosis not present

## 2018-04-06 MED ORDER — EMPAGLIFLOZIN 10 MG PO TABS: 10.0000 mg | ORAL_TABLET | Freq: Every day | ORAL | 2 refills | Status: DC

## 2018-04-06 NOTE — Progress Notes (Addendum)
S:     Chief Complaint  Patient presents with  . Medication Management    Diabetes    Patient arrives in good spirits, ambulating without assisatnce.  Presents for diabetes evaluation, education, and management at the request of Dr. [name] at last appointment on 03/25/2018. He notes that he does not like taking prescription medications, but prefers natural supplements. He previously tried true basal insulins as well as Trulicity, but they became unaffordable.   Family/Social History: On disability after an accident  Insurance coverage/medication affordability: Producer, television/film/video  Patient reports adherence with medications.  Current diabetes medications include: glipizide 10 mg BID, Humalog 75/25 30 units QAM, 120 units QPM Current hypertension medications include: lisinopril 40 mg daily   Patient reports hypoglycemic sweating, woke up in the middle of the night; however, notes that hypoglycemia is strongly tied to how much he ate the night before. Patient reports hyperglycemic symptoms including polyuria, polydipsia, polyphagia, nocturia, neuropathy, blurred vision.   Patient reported dietary habits: Eats 1-2 meals/day Breakfast: Typically skips Lunch: Often skips Dinner: Yesterday was chicken nuggets and fries; notes that he likes vegetables; grills, air fryer,  Snacks: Atkins bar, pack of peanut butter crackers;   Drinks: Water, 1-2 cups of coffee   Patient reported exercise habits: notes that he isn't able to exercise d/t injury  O:  Physical Exam  Constitutional: He appears well-developed and well-nourished.   Review of Systems  All other systems reviewed and are negative.   Lab Results  Component Value Date   HGBA1C 10.3 (H) 03/20/2018   Vitals:   04/06/18 1437  BP: (!) 144/81  Pulse: 77    Basic Metabolic Panel BMP Latest Ref Rng & Units 03/20/2018 03/03/2017 08/26/2016  Glucose 70 - 99 mg/dL 165(H) 124(H) 95  BUN 6 - 23 mg/dL 14 11 17   Creatinine 0.40  - 1.50 mg/dL 0.86 0.82 0.78  Sodium 135 - 145 mEq/L 137 137 140  Potassium 3.5 - 5.1 mEq/L 4.4 3.9 4.6  Chloride 96 - 112 mEq/L 100 104 105  CO2 19 - 32 mEq/L 30 24 28   Calcium 8.4 - 10.5 mg/dL 9.5 9.2 9.3    Lipid Panel     Component Value Date/Time   CHOL 223 (H) 03/20/2018 0843   TRIG 256.0 (H) 03/20/2018 0843   HDL 25.50 (L) 03/20/2018 0843   CHOLHDL 9 03/20/2018 0843   VLDL 51.2 (H) 03/20/2018 0843   LDLCALC 159 (H) 02/22/2016 1057   LDLDIRECT 157.0 03/20/2018 0843    Fasting SMBG: 140-200s  Clinical ASCVD: No ;   ASCVD risk factors: age 14-75  A/P: Following discussion and approval by Dr. [name], the following medication changes were made:   #Diabetes - Currently uncontrolled, most recent A1c 10.3 on 03/20/2018, worsened from 9.5% on 03/03/2017; Goal A1c <7%. Patient reports some overnight hypoglycemic events. Patient reports adherence with medication. Control is suboptimal due to inability to separately titrate basal/bolus regimen in this patient who typically eats 1 meal/day. Would be ideal to transition to basal/bolus regimen, however, patient has a significant supply of 75/25 insulin at home that he does not want to waste. He also does not want to use insulin vials/syringes due to dexterity concerns as a result of neuropathic pain - Continue insulin 75/25, encouraging adherence to the 30 unit morning dose; continue 120 unit evening dose - Start Jardiance (empagliflozin) 10 mg daily; counseled on genitourinary side effects, importance of hydration, sick day rules - Continue glipizide 10 mg BID. Consider down titration/discontinuation  as Vania Rea takes effect.  - Moving forward, appropriate to consider changing to true basal/bolus therapy, utilizing coupon cards as patient has Acupuncturist patient instructions provided.  Total time in face to face counseling 60 minutes.    Follow up in Pharmacist Clinic Visit 4 weeks.   De Hollingshead, [name] PGY2  Ambulatory Care Pharmacy Resident Phone: [phone]   I have reviewed the above information and agree with above.   Deborra Medina, MD

## 2018-04-06 NOTE — Patient Instructions (Addendum)
It was great to meet you today!  We are going to add a new medication today: Jardiance (empagliflozin) 10 mg daily. Take this in the morning.   Focus on taking the morning 30 unit dose of insulin, even if your sugars are <100. Do not take it if your morning sugar is <80.    Continue taking glipizide 10 mg twice daily.   Take this coupon card to the pharmacy.    Schedule follow up with me in 4 weeks. Call with any questions.

## 2018-04-06 NOTE — Assessment & Plan Note (Signed)
#  Diabetes - Currently uncontrolled, most recent A1c 10.3 on 03/20/2018, worsened from 9.5% on 03/03/2017; Goal A1c <7%. Patient reports some overnight hypoglycemic events. Patient reports adherence with medication. Control is suboptimal due to inability to separately titrate basal/bolus regimen in this patient who typically eats 1 meal/day. Would be ideal to transition to basal/bolus regimen, however, patient has a significant supply of 75/25 insulin at home that he does not want to waste. He also does not want to use insulin vials/syringes due to dexterity concerns as a result of neuropathic pain - Continue insulin 75/25, encouraging adherence to the 30 unit morning dose; continue 120 unit evening dose - Start Jardiance (empagliflozin) 10 mg daily; counseled on genitourinary side effects, importance of hydration, sick day rules - Continue glipizide 10 mg BID. Consider down titration/discontinuation as Jardiance takes effect.  - Moving forward, appropriate to consider changing to true basal/bolus therapy, utilizing coupon cards as patient has Pharmacist, community

## 2018-04-07 ENCOUNTER — Telehealth: Payer: Self-pay

## 2018-04-07 DIAGNOSIS — Z1211 Encounter for screening for malignant neoplasm of colon: Secondary | ICD-10-CM

## 2018-04-07 NOTE — Telephone Encounter (Signed)
Signed.

## 2018-04-07 NOTE — Telephone Encounter (Signed)
Pt stated that the cologuard that was discussed during his last office visit was not ordered. I have reordered it and put it in the quick sign folder.

## 2018-04-08 NOTE — Telephone Encounter (Signed)
Faxed

## 2018-04-13 LAB — COLOGUARD: Cologuard: NEGATIVE

## 2018-04-15 ENCOUNTER — Telehealth: Payer: Self-pay | Admitting: *Deleted

## 2018-04-15 ENCOUNTER — Telehealth: Payer: Self-pay

## 2018-04-15 ENCOUNTER — Ambulatory Visit (INDEPENDENT_AMBULATORY_CARE_PROVIDER_SITE_OTHER): Payer: 59 | Admitting: Podiatry

## 2018-04-15 ENCOUNTER — Encounter: Payer: Self-pay | Admitting: Podiatry

## 2018-04-15 ENCOUNTER — Ambulatory Visit (INDEPENDENT_AMBULATORY_CARE_PROVIDER_SITE_OTHER): Payer: 59

## 2018-04-15 VITALS — BP 138/71 | HR 80

## 2018-04-15 DIAGNOSIS — M2041 Other hammer toe(s) (acquired), right foot: Secondary | ICD-10-CM | POA: Diagnosis not present

## 2018-04-15 DIAGNOSIS — M48061 Spinal stenosis, lumbar region without neurogenic claudication: Secondary | ICD-10-CM

## 2018-04-15 DIAGNOSIS — M2042 Other hammer toe(s) (acquired), left foot: Secondary | ICD-10-CM

## 2018-04-15 DIAGNOSIS — M5416 Radiculopathy, lumbar region: Principal | ICD-10-CM

## 2018-04-15 DIAGNOSIS — B070 Plantar wart: Secondary | ICD-10-CM

## 2018-04-15 MED ORDER — FLUOROURACIL 5 % EX CREA: TOPICAL_CREAM | Freq: Two times a day (BID) | CUTANEOUS | 1 refills | Status: DC

## 2018-04-15 MED ORDER — GABAPENTIN 100 MG PO CAPS: 100.0000 mg | ORAL_CAPSULE | Freq: Three times a day (TID) | ORAL | 1 refills | Status: DC

## 2018-04-15 MED ORDER — TAMSULOSIN HCL 0.4 MG PO CAPS: 0.4000 mg | ORAL_CAPSULE | Freq: Every day | ORAL | 1 refills | Status: DC

## 2018-04-15 MED ORDER — TRAMADOL HCL 50 MG PO TABS: 50.0000 mg | ORAL_TABLET | Freq: Four times a day (QID) | ORAL | 5 refills | Status: DC | PRN

## 2018-04-15 NOTE — Telephone Encounter (Signed)
Spoke with pt to let him know that we received a refill request for Tramadol from his mail order and tl let him know that we do not fill that through mail order because it is a controlled substance and only 30 days worth is prescribed at a time. Pt stated that [facility] told him that it could be a 30 day supply and that it would only cost him $1.90 versus $11.00 at the local pharmacy. Pt is wanting to know if he can get the Tramadol filled through his mail order.

## 2018-04-15 NOTE — Telephone Encounter (Signed)
YES WE CAN DO THAT ,  I sent the  monthly refill to [facility] today

## 2018-04-15 NOTE — Telephone Encounter (Signed)
Pt states he went to the pharmacy and he can not afford the $200.00 and some dollar medication. I told pt that I would inform Dr. Milinda Pointer and call with an alternative. Pt states if there is no alternative we can cancel his appt and he will not be back.

## 2018-04-15 NOTE — Progress Notes (Signed)
Subjective:  Patient ID: Connor Ross, male    DOB: 11-Jan-1966,  MRN: [id number] HPI Chief Complaint  Patient presents with  . Plantar Warts    left foot ball of foot; pt stated, "had wart for about 31yrs now, usually shave it down myself when it gets too bad to walk on"  . Nail Problem    bilateral great toes; pt stated, "cut out ingrowns, diabetic, just would like them looked at"  . Hammer Toe    bilateral toes 2-4; pt stated, "toes curl under; primary is concerned so was referred here"  . Callouses    right foot lateral callous trim    52 y.o. male presents with the above complaint.   ROS: Denies fever chills nausea vomiting muscle aches pains back pain he has 6 ruptured disks he relates a moderate to severe diabetes uncontrolled.  Past Medical History:  Diagnosis Date  . Diabetes mellitus   . Hyperlipidemia   . Hypertension   . Normal cardiac stress test 2012   [facility]   Past Surgical History:  Procedure Laterality Date  . LUMBAR DISC SURGERY      Current Outpatient Medications:  .  aspirin (ASPIRIN ADULT LOW STRENGTH) 81 MG EC tablet, Take 1 tablet (81 mg total) by mouth daily. Swallow whole., Disp: 90 tablet, Rfl: 3 .  empagliflozin (JARDIANCE) 10 MG TABS tablet, Take 10 mg by mouth daily., Disp: 30 tablet, Rfl: 2 .  gabapentin (NEURONTIN) 100 MG capsule, Take 1 capsule (100 mg total) by mouth 3 (three) times daily., Disp: 90 capsule, Rfl: 1 .  glipiZIDE (GLUCOTROL) 10 MG tablet, TAKE 1 TABLET BY MOUTH 2  TIMES DAILY BEFORE MEALS, Disp: 180 tablet, Rfl: 0 .  glucose blood test strip, For use twice daily  For diabetes testing   For uncontrolled diabetes, Disp: 100 each, Rfl: 6 .  HUMALOG MIX 75/25 KWIKPEN (75-25) 100 UNIT/ML Kwikpen, INJECT SUBCUTANEOUSLY 30  UNITS IN THE MORNING AND  120 UNITS IN THE EVENING  PRE MEAL, Disp: 135 mL, Rfl: 0 .  Insulin Pen Needle 32G X 6 MM MISC, Use daily with insulin pen, Disp: 50 each, Rfl: 0 .  Lancets (FREESTYLE) lancets, 1 each  by Other route 2 (two) times daily. DX 250.02, Disp: 100 each, Rfl: 12 .  lisinopril (PRINIVIL,ZESTRIL) 40 MG tablet, TAKE 1 TABLET BY MOUTH  DAILY, Disp: 90 tablet, Rfl: 0 .  tamsulosin (FLOMAX) 0.4 MG CAPS capsule, Take 1 capsule (0.4 mg total) by mouth daily., Disp: 90 capsule, Rfl: 1 .  traMADol (ULTRAM) 50 MG tablet, Take 1 tablet (50 mg total) by mouth every 6 (six) hours as needed for moderate pain. For spinal stenosis, Disp: 90 tablet, Rfl: 5 .  Zn-Pyg Afri-Nettle-Saw Palmet (SAW PALMETTO COMPLEX PO), Take 1 tablet by mouth., Disp: , Rfl:  .  diclofenac (VOLTAREN) 75 MG EC tablet, Take 1 tablet (75 mg total) by mouth 2 (two) times daily. (Patient not taking: Reported on 04/06/2018), Disp: 60 tablet, Rfl: 0 .  fluorouracil (EFUDEX) 5 % cream, Apply topically 2 (two) times daily., Disp: 40 g, Rfl: 1  Allergies  Allergen Reactions  . Hctz [Hydrochlorothiazide] Other (See Comments)    Extreme Cramping  . Metformin And Related Diarrhea   Review of Systems Objective:   Vitals:   04/15/18 1457  BP: 138/71  Pulse: 80    General: Well developed, nourished, in no acute distress, alert and oriented x3   Dermatological: Skin is warm, dry and supple bilateral.  Nails x 10 are well maintained; remaining integument appears unremarkable at this time. There are no open sores, no preulcerative lesions, no rash or signs of infection present.  Verruca plantaris less than 5 mm in diameter sub-third metatarsal head left foot.  Nail dystrophy's hallux and lesser digits.  Vascular: Dorsalis Pedis artery and Posterior Tibial artery pedal pulses are 2/4 bilateral with immedate capillary fill time. Pedal hair growth present. No varicosities and no lower extremity edema present bilateral.  Venous stasis edema bilateral lower extremity  Neruologic: Grossly intact via light touch bilateral. Vibratory intact via tuning fork bilateral. Protective threshold with Semmes Wienstein monofilament slightly diminished to  all pedal sites bilateral. Patellar and Achilles deep tendon reflexes 2+ bilateral. No Babinski or clonus noted bilateral.   Musculoskeletal: No gross boney pedal deformities bilateral. No pain, crepitus, or limitation noted with foot and ankle range of motion bilateral. Muscular strength 5/5 in all groups tested bilateral.  It appears that he has Charcot deformity or a fracture to the fifth metatarsal base of the right foot resulting in hypertrophy of the fifth metatarsal.  Gait: Unassisted, Nonantalgic.    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  Current A1c is at 10.3.  Wart plantar aspect left foot flexible hammertoe deformities rigid mallet toe deformities hallux.  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  Plan: Discussed in great with him in great detail today options.  At this point is contraindicated for a elective procedure to be performed on a patient with an A1c of a 10.3 so we are not going to surgically excise a wart from the plantar aspect of his left foot.  He would also like to have his calluses cut and trimmed however they are actually not thick enough to trim.  At this point I encouraged him to work hard to get his A1c down.  He is very pessimistic at this point and rightfully so with all of the things that have happened to him.  He states that he does not have money to continue to see doctors and to purchase prescriptions.  I placed Cantharone under occlusion to be washed off thoroughly tomorrow and ordered him a compounded Efudex or 5-fluorouracil ointment to applied to the wart.  I will follow-up with him in 6 weeks if necessary or 6 months for diabetic check.     Swati Granberry T. [place], [place]

## 2018-04-15 NOTE — Telephone Encounter (Signed)
That will be fine. 

## 2018-04-16 ENCOUNTER — Other Ambulatory Visit: Payer: Self-pay

## 2018-04-16 MED ORDER — NONFORMULARY OR COMPOUNDED ITEM: 1 refills | Status: DC

## 2018-04-16 MED ORDER — BLOOD GLUCOSE METER KIT: PACK | 0 refills | Status: DC

## 2018-04-16 NOTE — Telephone Encounter (Signed)
Unable to leave a message. Mail-box is full.

## 2018-04-16 NOTE — Telephone Encounter (Signed)
Faxed orders to [facility]. 

## 2018-04-16 NOTE — Telephone Encounter (Signed)
I called pt, informed of the compound from [facility] [phone] and they would call with insurance coverage and delivery information. I told pt [facility] would call to discuss his cost and that he should check his Mailbox because it is full.

## 2018-04-19 ENCOUNTER — Telehealth: Payer: Self-pay | Admitting: Internal Medicine

## 2018-04-19 NOTE — Telephone Encounter (Signed)
The results of patient's cologuard is negative.   We will repeat every 3 years For colon CA screening. Abstracted and my chart used to  notify patient

## 2018-04-20 ENCOUNTER — Telehealth: Payer: Self-pay

## 2018-04-20 NOTE — Telephone Encounter (Signed)
Copied from [place] [id number]. Topic: General - Other >> Apr 16, 2018 11:30 AM Yvette Rack wrote: Reason for CRM: Pt states he needs a Rx for test strips to check his blood 3 times daily along with a Rx for a monitor. Pt requests 90 day supply to be sent to [facility], [address] [place] [address]   Phone: [phone] Fax: [phone] >> Apr 20, 2018 11:03 AM Percell Belt A wrote: Pt called in to check the status of Monitor

## 2018-04-21 MED ORDER — BLOOD GLUCOSE METER KIT: PACK | 0 refills | Status: AC

## 2018-04-21 NOTE — Telephone Encounter (Signed)
Spoke with pt to let him know that the rx for the glucose monitor kit was sent in to [facility] for pt to be able to check sugars three times daily. The pt would like a 90 day supply of test strips and lancets sent to [facility] as well. Advised the pt that he should call [facility] and find out the exact brand of monitor that they are giving him so that we make sure we get the right supplies sent in. Pt gave a verbal understanding and stated that he would let us know.

## 2018-04-21 NOTE — Addendum Note (Signed)
Addended by: Adair Laundry on: 04/21/2018 09:44 AM   Modules accepted: Orders

## 2018-04-22 NOTE — Telephone Encounter (Signed)
rx has been faxed to [facility].  

## 2018-04-22 NOTE — Telephone Encounter (Signed)
[facility] does not have the glucose monitor kit nor test strips and lancet. Please [facility]

## 2018-04-28 ENCOUNTER — Other Ambulatory Visit: Payer: Self-pay | Admitting: Internal Medicine

## 2018-04-29 ENCOUNTER — Other Ambulatory Visit: Payer: Self-pay | Admitting: Internal Medicine

## 2018-05-04 ENCOUNTER — Ambulatory Visit (INDEPENDENT_AMBULATORY_CARE_PROVIDER_SITE_OTHER): Payer: 59 | Admitting: Pharmacist

## 2018-05-04 ENCOUNTER — Encounter: Payer: Self-pay | Admitting: Pharmacist

## 2018-05-04 VITALS — BP 130/83 | HR 73 | Wt 324.0 lb

## 2018-05-04 DIAGNOSIS — E1165 Type 2 diabetes mellitus with hyperglycemia: Secondary | ICD-10-CM | POA: Diagnosis not present

## 2018-05-04 DIAGNOSIS — E785 Hyperlipidemia, unspecified: Secondary | ICD-10-CM | POA: Diagnosis not present

## 2018-05-04 MED ORDER — EMPAGLIFLOZIN 25 MG PO TABS: 25.0000 mg | ORAL_TABLET | Freq: Every day | ORAL | 3 refills | Status: DC

## 2018-05-04 MED ORDER — INSULIN LISPRO PROT & LISPRO (75-25 MIX) 100 UNIT/ML KWIKPEN: PEN_INJECTOR | SUBCUTANEOUS | 0 refills | Status: DC

## 2018-05-04 NOTE — Patient Instructions (Addendum)
It was great to see you today!  We are going to make 2 changes:  1) Increase Jardiance to 25 mg daily 2) Tonight, change to taking 50 units of insulin in the morning and 100 units in the evening.   Continue checking blood sugar daily:  1) Fasting, before eating breakfast 2) Before supper 3) About 2 hours after supper   Look into your copay for a cholesterol medication called rosuvastatin (generic name for Crestor). Adding this would help reduce your risk of heart disease.    Feel free to call the clinic with any questions. Schedule follow up with me in about 4-5 weeks.    [name], [name]

## 2018-05-04 NOTE — Progress Notes (Addendum)
S:     Chief Complaint  Patient presents with  . Medication Management    Diabetes    Patient arrives in good spirits, ambulating without assisatnce.  Presents for diabetes evaluation, education, and management at the request of Dr. [name] at last appointment on 03/25/2018. Last seen in Pharmacy Clinic on 04/06/2018 - at that time, Vania Rea was added. We had discussed initiation of true basal/bolus insulin, but patient preferred to use up the supply of insulin mix 75/25 that he had at home.   Today, he notes that he is pleased with Jardiance therapy. He denies any genitourinary concerns. Notes that he feels his appetite has lessened since starting Jardiance therapy. Has been checking SMBG twice daily and keeping extensive logs.   Family/Social History: On disability after an accident  Insurance coverage/medication affordability: [employer] card for [employer]  Patient reports adherence with medications.  Current diabetes medications include: glipizide 10 mg BID, Humalog 75/25 30 units QAM, 120 units QPM, Jardiance 10 mg daily Current hypertension medications include: lisinopril 40 mg daily   Patient denies hypoglycemia. Does report 1 episode of SMBG of 77, but he had accidentally taken 120 units of insulin in the morning instead of 30   Patient reported dietary habits: Eats 1-2 meals/day Breakfast: Typically skips Lunch: Often skips Dinner: Wilburn Mylar was chicken nuggets and fries; notes that he likes vegetables; grills, air fryer,  Snacks: Atkins bar, pack of peanut butter crackers;   Drinks: Water, 1-2 cups of coffee  - Has quit eating snacks past 8:30 pm most nights.   Patient reported exercise habits: notes that he isn't able to exercise d/t injury  O:  Physical Exam  Constitutional: He appears well-developed and well-nourished.   Review of Systems  All other systems reviewed and are negative.   Lab Results  Component Value Date   HGBA1C 10.3  (H) 03/20/2018   Vitals:   05/04/18 1402  BP: 130/83  Pulse: 73    Basic Metabolic Panel BMP Latest Ref Rng & Units 03/20/2018 03/03/2017 08/26/2016  Glucose 70 - 99 mg/dL 165(H) 124(H) 95  BUN 6 - 23 mg/dL 14 11 17   Creatinine 0.40 - 1.50 mg/dL 0.86 0.82 0.78  Sodium 135 - 145 mEq/L 137 137 140  Potassium 3.5 - 5.1 mEq/L 4.4 3.9 4.6  Chloride 96 - 112 mEq/L 100 104 105  CO2 19 - 32 mEq/L 30 24 28   Calcium 8.4 - 10.5 mg/dL 9.5 9.2 9.3    Lipid Panel     Component Value Date/Time   CHOL 223 (H) 03/20/2018 0843   TRIG 256.0 (H) 03/20/2018 0843   HDL 25.50 (L) 03/20/2018 0843   CHOLHDL 9 03/20/2018 0843   VLDL 51.2 (H) 03/20/2018 0843   LDLCALC 159 (H) 02/22/2016 1057   LDLDIRECT 157.0 03/20/2018 0843    Fasting SMBG: 91-159; highly variable Pre-supper: 100-229  Post supper: 175-211, though is often ~1 hour after supper  Clinical ASCVD: No ;   ASCVD risk factors: age 53-75;  10 year ASCVD risk score: 23.4%  A/P: Following discussion and approval by Dr. [name], the following medication changes were made:   #Diabetes - Currently uncontrolled, most recent A1c 10.3 on 03/20/2018, worsened from 9.5% on 03/03/2017; Goal A1c <7%. Most concerned about midday hyperglycemia; appropriate to increase Jardiance for full benefit and adjust insulin dosing to maximize daytime effect - Increase Jardiance to 25 mg daily. Patient counseled to take 2 of the tablets he currently has together until finished,  then pick up new prescription at the pharmacy - Change insulin dosing to take 50 units QAM, 100 units with supper. Patient to continue checking SMBG 3 times daily - Continue glipizide 10 mg BID. Low threshold to decrease dose if hypoglycemia develops - Moving forward, appropriate to consider changing to true basal/bolus therapy, utilizing coupon cards as patient has Pharmacist, community, if unable to achieve control with mixed insulin therapy while minimizing hypoglycemia  #ASCVD Risk- patient  with diabetes, 10 year ASCVD risk 23%. Notes history of joint pain with statins, but cannot remember which medication. ADA guidelines recommend high intensity statin in patients with ASCVD risk score >20%, though appropriate to initiate moderate intensity rosuvastatin to minimize risk of muscle side effects - Patient concerned over adding an extra copay. He will investigate the cost of rosuvastatin and we will discuss at future appointments.   Written patient instructions provided. Total time in face to face counseling 60 minutes.    Follow up in Pharmacist Clinic Visit 4 weeks.   De Hollingshead, [name] PGY2 Ambulatory Care Pharmacy Resident Phone: [phone]   I have reviewed the above information and agree with above.   Deborra Medina, MD

## 2018-05-04 NOTE — Assessment & Plan Note (Signed)
#  Diabetes - Currently uncontrolled, most recent A1c 10.3 on 03/20/2018, worsened from 9.5% on 03/03/2017; Goal A1c <7%. Most concerned about midday hyperglycemia; appropriate to increase Jardiance for full benefit and adjust insulin dosing to maximize daytime effect - Increase Jardiance to 25 mg daily. Patient counseled to take 2 of the tablets he currently has together until finished, then pick up new prescription at the pharmacy - Change insulin dosing to take 50 units QAM, 100 units with supper. Patient to continue checking SMBG 3 times daily - Continue glipizide 10 mg BID. Low threshold to decrease dose if hypoglycemia develops - Moving forward, appropriate to consider changing to true basal/bolus therapy, utilizing coupon cards as patient has [employer], if unable to achieve control with mixed insulin therapy while minimizing hypoglycemia

## 2018-05-04 NOTE — Assessment & Plan Note (Signed)
#  ASCVD Risk- patient with diabetes, 10 year ASCVD risk 23%. Notes history of joint pain with statins, but cannot remember which medication. ADA guidelines recommend high intensity statin in patients with ASCVD risk score >20%, though appropriate to initiate moderate intensity rosuvastatin to minimize risk of muscle side effects - Patient concerned over adding an extra copay. He will investigate the cost of rosuvastatin and we will discuss at future appointments.

## 2018-05-07 ENCOUNTER — Other Ambulatory Visit: Payer: Self-pay | Admitting: Internal Medicine

## 2018-05-12 ENCOUNTER — Ambulatory Visit (INDEPENDENT_AMBULATORY_CARE_PROVIDER_SITE_OTHER): Payer: 59 | Admitting: Podiatry

## 2018-05-12 ENCOUNTER — Encounter: Payer: Self-pay | Admitting: Podiatry

## 2018-05-12 DIAGNOSIS — B070 Plantar wart: Secondary | ICD-10-CM | POA: Diagnosis not present

## 2018-05-12 DIAGNOSIS — L603 Nail dystrophy: Secondary | ICD-10-CM | POA: Diagnosis not present

## 2018-05-13 NOTE — Progress Notes (Signed)
   Subjective: 52 year old male presenting with a chief complaint of pain to the bilateral great toenails that has been present for the past several months. He states his A1C was too high to be treated at his last visit. He has not done anything at home for treatment. There are no modifying factors noted.  He also reports a painful lesion to the plantar aspect of the left foot that has been present for the past two years. Walking increases the pain. He states he normally shaves the area down himself for treatment. Patient is here for further evaluation and treatment.   Past Medical History:  Diagnosis Date  . Diabetes mellitus   . Hyperlipidemia   . Hypertension   . Normal cardiac stress test 2012   [place]    Objective:  General: Well developed, nourished, in no acute distress, alert and oriented x3   Dermatology: Hyperkeratotic, discolored, thickened, onychodystrophy of the left great toenail. Skin is warm, dry and supple bilateral lower extremities. Negative for open lesions or macerations. Hyperkeratotic skin lesion noted to the plantar aspect of the left foot approximately 1 cm in diameter. Pinpoint bleeding noted upon debridement.   Vascular: Dorsalis Pedis artery and Posterior Tibial artery pedal pulses palpable. No lower extremity edema noted.   Neruologic: Grossly intact via light touch bilateral.  Musculoskeletal: Muscular strength within normal limits in all groups bilateral. Normal range of motion noted to all pedal and ankle joints.   Assessment:  #1 Dystrophic, painful nail of the left hallux #2 Plantar wart left  Plan of Care:  1. Patient evaluated.  2. Discussed treatment alternatives and plan of care. Explained nail avulsion procedure and post procedure course to patient. 3. Patient opted for total temporary nail avulsion.  4. Prior to procedure, local anesthesia infiltration utilized using 3 ml of a 50:50 mixture of 2% plain lidocaine and 0.5% plain marcaine in a  normal hallux block fashion and a betadine prep performed.  5. Light dressing applied. 6. Continue using [employer] wart cream for three weeks.  7. Return to clinic as needed.   Edrick Kins, DPM [facility]  Dr. Edrick Kins, [place]                                        [place], [place] [address]                Office [phone]  Fax [phone]

## 2018-05-29 ENCOUNTER — Other Ambulatory Visit: Payer: Self-pay | Admitting: Internal Medicine

## 2018-06-01 ENCOUNTER — Ambulatory Visit (INDEPENDENT_AMBULATORY_CARE_PROVIDER_SITE_OTHER): Payer: 59 | Admitting: Pharmacist

## 2018-06-01 ENCOUNTER — Encounter: Payer: Self-pay | Admitting: Pharmacist

## 2018-06-01 DIAGNOSIS — E785 Hyperlipidemia, unspecified: Secondary | ICD-10-CM | POA: Diagnosis not present

## 2018-06-01 DIAGNOSIS — I152 Hypertension secondary to endocrine disorders: Secondary | ICD-10-CM | POA: Diagnosis not present

## 2018-06-01 DIAGNOSIS — E1165 Type 2 diabetes mellitus with hyperglycemia: Secondary | ICD-10-CM | POA: Diagnosis not present

## 2018-06-01 MED ORDER — INSULIN LISPRO PROT & LISPRO (75-25 MIX) 100 UNIT/ML KWIKPEN: PEN_INJECTOR | SUBCUTANEOUS | 0 refills | Status: DC

## 2018-06-01 NOTE — Progress Notes (Addendum)
S:     Chief Complaint  Patient presents with  . Medication Management    Diabetes    Patient arrives in good spirits, ambulating without assistance.  Presents for diabetes evaluation, education, and management at the request of Dr. [name] at last appointment on 03/25/2018. Last seen in [facility] on 05/04/2018 - at that time, Jardiance dose was maximized and insulin 75/25 dose adjusted to better cover daytime sugar.   Today, he notes that he feels he has less of a sugar craving. He still endorses appetite suppression since starting Jardiance. He increased to Jardiance 25 mg about 2 weeks ago, and denies any tolerability concerns with the increased dose. He denies any episodes of hypoglycemia since our last visit. He endorses working with his wife to try to decrease his carbohydrate intake.  Family/Social History: On disability after an accident  Insurance coverage/medication affordability: [place] card for [employer]  Patient reports adherence with medications.  Current diabetes medications include: glipizide 10 mg BID, Humalog 75/25 50 units QAM, 100 units QPM, Jardiance 25 mg daily Current hypertension medications include: lisinopril 40 mg daily   Patient denies hypoglycemia.   Endorses eating more fruits + cottage cheese for snacks; has been eating ham and cheese tortilla roll ups instead of using bread. Plans to smoke his Thanksgiving [place] instead of baking/frying with oil.  Patient reported exercise habits: notes that he isn't able to exercise d/t injury  O:  Physical Exam  Constitutional: He appears well-developed and well-nourished.   Review of Systems  All other systems reviewed and are negative.   Lab Results  Component Value Date   HGBA1C 10.3 (H) 03/20/2018   Vitals:   06/01/18 1432  BP: (!) 431/54    Basic Metabolic Panel BMP Latest Ref Rng & Units 03/20/2018 03/03/2017 08/26/2016  Glucose 70 - 99 mg/dL 165(H) 124(H) 95    BUN 6 - 23 mg/dL 14 11 17   Creatinine 0.40 - 1.50 mg/dL 0.86 0.82 0.78  Sodium 135 - 145 mEq/L 137 137 140  Potassium 3.5 - 5.1 mEq/L 4.4 3.9 4.6  Chloride 96 - 112 mEq/L 100 104 105  CO2 19 - 32 mEq/L 30 24 28   Calcium 8.4 - 10.5 mg/dL 9.5 9.2 9.3    Lipid Panel     Component Value Date/Time   CHOL 223 (H) 03/20/2018 0843   TRIG 256.0 (H) 03/20/2018 0843   HDL 25.50 (L) 03/20/2018 0843   CHOLHDL 9 03/20/2018 0843   VLDL 51.2 (H) 03/20/2018 0843   LDLCALC 159 (H) 02/22/2016 1057   LDLDIRECT 157.0 03/20/2018 0843    Fasting Pre Supper Post Supper   204 179 277   149 111 190   150 177 131   149 182 185   172     101 261 204   197 113 179   164 189    170 167    138    Average 159 172 194     Clinical ASCVD: No ;   ASCVD risk factors: age 34-75;  10 year ASCVD risk score: 23.4%  A/P: Following discussion and approval by Dr. [name], the following medication changes were made:   #Diabetes - Currently uncontrolled, most recent A1c 10.3 on 03/20/2018, worsened from 9.5% on 03/03/2017; Goal A1c <7%. Appropriate to increase upper insulin dose to improve fasting (d/t NPH component) and post supper (d/t R component) results - Continue Novolin 75/25 50 units with breakfast; increase to 120 units with supper -  Continue Jardiance 25 mg daily and glipizide 10 mg BID. Low threshold for reduction if hypoglycemia develops - Extensively counseled on dietary modifications. Provided a handout for Healthy Meal Planning. - Plan to check A1c at next appointment  #ASCVD Risk- Diabetes, 10 year ASCVD risk 23%. Patient unsure of which statin medication caused joint pain.  - Start moderate intensity statin. Patient is concerned about costs; he will contact [facility] to investigate his copay for rosuvastatin 10 mg daily or pravastatin 10 mg daily. Will add order once patient responds with copay information - Recheck lipid panel in 4-6 weeks after starting  #Hypertension- longstanding,  uncontrolled today in office. Goal <130/80 per ADA 2019 guidelines for patient with ASCVD risk score >15%.  - Patient believes he took lisinopril this morning. Will continue to monitor moving forward, and consider additional pharmacotherapy if BP remains elevated.   Written patient instructions provided. Total time in face to face counseling 30 minutes.    Follow up in Pharmacist Clinic Visit 6 weeks.   De Hollingshead, [name] PGY2 Ambulatory Care Pharmacy Resident Phone: [phone]   I have reviewed the above information and agree with above.   Deborra Medina, MD

## 2018-06-01 NOTE — Assessment & Plan Note (Signed)
#  ASCVD Risk- Diabetes, 10 year ASCVD risk 23%. Patient unsure of which statin medication caused joint pain.  - Start moderate intensity statin. Patient is concerned about costs; he will contact [facility] to investigate his copay for rosuvastatin 10 mg daily or pravastatin 10 mg daily. Will add order once patient responds with copay information - Recheck lipid panel in 4-6 weeks after starting

## 2018-06-01 NOTE — Assessment & Plan Note (Signed)
#  Diabetes - Currently uncontrolled, most recent A1c 10.3 on 03/20/2018, worsened from 9.5% on 03/03/2017; Goal A1c <7%. Appropriate to increase upper insulin dose to improve fasting (d/t NPH component) and post supper (d/t R component) results - Continue Novolin 75/25 50 units with breakfast; increase to 120 units with supper - Continue Jardiance 25 mg daily and glipizide 10 mg BID. Low threshold for reduction if hypoglycemia develops - Extensively counseled on dietary modifications. Provided a handout for Healthy Meal Planning. - Plan to check A1c at next appointment

## 2018-06-01 NOTE — Assessment & Plan Note (Signed)
#  Hypertension- longstanding, uncontrolled today in office. Goal <130/80 per ADA 2019 guidelines for patient with ASCVD risk score >15%.  - Patient believes he took lisinopril this morning. Will continue to monitor moving forward, and consider additional pharmacotherapy if BP remains elevated.

## 2018-06-01 NOTE — Patient Instructions (Addendum)
It was great to see you today!  We are going to make a slight insulin adjustment:   1) Continue taking Novolin 50 units in the mornings. Increase to 120 units with supper.   Continue Jardiance 25 mg daily and glipizide 10 mg twice daily. If you start to have low blood sugars over night, please give clinic a call and we can adjust as needed.    Give clinic a call when you find out if you are OK with the cost of rosuvastatin 10 mg daily or pravastatin 40 mg daily. These agents will help reduce your risk of heart attack or stroke.   Schedule follow up with me at the end of December or beginning of January. We will check an A1c at that time.    [name], [name]

## 2018-06-20 ENCOUNTER — Other Ambulatory Visit: Payer: Self-pay | Admitting: Internal Medicine

## 2018-06-20 DIAGNOSIS — E1165 Type 2 diabetes mellitus with hyperglycemia: Secondary | ICD-10-CM

## 2018-07-13 ENCOUNTER — Encounter: Payer: Self-pay | Admitting: Pharmacist

## 2018-07-13 ENCOUNTER — Ambulatory Visit (INDEPENDENT_AMBULATORY_CARE_PROVIDER_SITE_OTHER): Payer: 59 | Admitting: Pharmacist

## 2018-07-13 VITALS — BP 128/81 | HR 71 | Wt 322.2 lb

## 2018-07-13 DIAGNOSIS — E1165 Type 2 diabetes mellitus with hyperglycemia: Secondary | ICD-10-CM | POA: Diagnosis not present

## 2018-07-13 DIAGNOSIS — E785 Hyperlipidemia, unspecified: Secondary | ICD-10-CM | POA: Diagnosis not present

## 2018-07-13 LAB — POCT GLYCOSYLATED HEMOGLOBIN (HGB A1C): HEMOGLOBIN A1C: 6.8 % — AB (ref 4.0–5.6)

## 2018-07-13 MED ORDER — INSULIN LISPRO PROT & LISPRO (75-25 MIX) 100 UNIT/ML KWIKPEN: PEN_INJECTOR | SUBCUTANEOUS | 0 refills | Status: DC

## 2018-07-13 NOTE — Assessment & Plan Note (Signed)
#  ASCVD Risk- Diabetes, 10 year ASCVD risk 23%. Patient unsure of which statin medication caused joint pain. Would like to start high intensity statin, but patient is unable to afford the ~$50 copay through his insurance or the $15 [facility] discount plan cost for atorvastatin. - Moving forward, continue to reevaluate patient willingness to start statin therapy. Would prefer to utilize pravastatin or rosuvastatin, as hydrophilic statins are associated with lower incidences of muscle associated side effects

## 2018-07-13 NOTE — Assessment & Plan Note (Signed)
#  Diabetes - Currently controlled, most recent A1c 6.8% today improved from 10.3% on 03/20/2018. Goal A1c <7%.  - Continue Novolin 75/25 50 units with breakfast; increase to 120 units with supper - Continue Jardiance 25 mg daily and glipizide 10 mg BID. Low threshold for reduction of glipizide if hypoglycemia develops

## 2018-07-13 NOTE — Patient Instructions (Signed)
CONGRATULATIONS!   Continue your current medications: Jardiance 25 mg daily and insulin 75/25 50 units in the morning, 120 units every evening. Keep checking your blood sugars 1) first thin in the morning 2) after supper, just to keep an eye on things.   If the nerve pain in your arm doesn't get better, talk to Dr. [name] about it at your next appointment.   Please feel free to reach out to clinic with any future questions or concerns.    [name], [name]

## 2018-07-13 NOTE — Progress Notes (Addendum)
S:     Chief Complaint  Patient presents with  . Medication Management    Diabetes    Patient arrives in good spirits, ambulating without assistance.  Presents for diabetes evaluation, education, and management at the request of Dr. [name] at last appointment on 03/25/2018. Last seen in [facility] on 05/04/2018 - at that time, current regimen was continued.   Today, he continues to endorse tolerability of his medications. He notes that he hasn't slept well the past few nights due to painful sensations in his arm. He is unsure if this is due to laying on his arm in a strange way, or progression of his neuropathy. He will continue to keep an eye on this. He notes that he did fall off a step ladder while changing a light bulb a few weeks ago, but that he healed appropriately. He did not hit his head. He is unsure if he lost footing due to lack of sensation in his feet.  Family/Social History: On disability after an accident  Insurance coverage/medication affordability: [place] card for [employer]  Patient reports adherence with medications.  Current diabetes medications include: glipizide 10 mg BID, Humalog 75/25 50 units QAM, 100-120 units QPM, Jardiance 25 mg daily Current hypertension medications include: lisinopril 40 mg daily   Patient denies s/sx hypoglycemia. Notes improvement in nocturia (use to get up 3-4 times a night)   O:  Physical Exam Constitutional:      Appearance: He is well-developed.    Review of Systems  All other systems reviewed and are negative.   Lab Results  Component Value Date   HGBA1C 6.8 (A) 07/13/2018   Vitals:   07/13/18 1536  BP: 128/81  Pulse: 71    Basic Metabolic Panel BMP Latest Ref Rng & Units 03/20/2018 03/03/2017 08/26/2016  Glucose 70 - 99 mg/dL 165(H) 124(H) 95  BUN 6 - 23 mg/dL 14 11 17   Creatinine 0.40 - 1.50 mg/dL 0.86 0.82 0.78  Sodium 135 - 145 mEq/L 137 137 140  Potassium 3.5 - 5.1 mEq/L 4.4  3.9 4.6  Chloride 96 - 112 mEq/L 100 104 105  CO2 19 - 32 mEq/L 30 24 28   Calcium 8.4 - 10.5 mg/dL 9.5 9.2 9.3    Lipid Panel     Component Value Date/Time   CHOL 223 (H) 03/20/2018 0843   TRIG 256.0 (H) 03/20/2018 0843   HDL 25.50 (L) 03/20/2018 0843   CHOLHDL 9 03/20/2018 0843   VLDL 51.2 (H) 03/20/2018 0843   LDLCALC 159 (H) 02/22/2016 1057   LDLDIRECT 157.0 03/20/2018 0843   Date Before Breakfast  Before Dinner After Dinner  20-Dec 81  142  21-Dec 123  103  22-Dec 133  127  23-Dec 132 115   24-Dec 158  148  25-Dec 92 138   26-Dec 114 196   27-Dec 136 97   28-Dec 153  211  29-Dec 148 180   30-Dec 135     127 145 146     Clinical ASCVD: No ;   ASCVD risk factors: age 13-75;  10 year ASCVD risk score: 23.4%  A/P: Following discussion and approval by Dr. [name], the following medication changes were made:   #Diabetes - Currently controlled, most recent A1c 6.8% today improved from 10.3% on 03/20/2018. Goal A1c <7%.  - Continue Novolin 75/25 50 units with breakfast; increase to 120 units with supper - Continue Jardiance 25 mg daily and glipizide 10 mg BID. Low threshold  for reduction of glipizide if hypoglycemia develops  #ASCVD Risk- Diabetes, 10 year ASCVD risk 23%. Patient unsure of which statin medication caused joint pain. Would like to start high intensity statin, but patient is unable to afford the ~$50 copay through his insurance or the $15 [facility] discount plan cost for atorvastatin. - Moving forward, continue to reevaluate patient willingness to start statin therapy. Would prefer to utilize pravastatin or rosuvastatin, as hydrophilic statins are associated with lower incidences of muscle associated side effects   #Hypertension- longstanding, controlled today in office. Goal <130/80 per ADA 2019 guidelines for patient with ASCVD risk score >15%.  - Continue lisinopril 40 mg daily.   Written patient instructions provided. Total time in face to face counseling  30 minutes.    Follow up in Pharmacist Clinic Visit PRN. PCP appointment in 3 months.   De Hollingshead, [name] PGY2 Ambulatory Care Pharmacy Resident Phone: [phone]   I have reviewed the above information and agree with above.   Deborra Medina, MD

## 2018-07-22 ENCOUNTER — Other Ambulatory Visit: Payer: Self-pay | Admitting: Internal Medicine

## 2018-07-30 ENCOUNTER — Telehealth: Payer: Self-pay | Admitting: Internal Medicine

## 2018-07-30 ENCOUNTER — Telehealth: Payer: Self-pay

## 2018-07-30 NOTE — Telephone Encounter (Signed)
Copied from [place] [id number]. Topic: General - Other >> Jul 30, 2018 12:22 PM Leward Quan A wrote: Reason for CRM: Kathlen Mody patient wife called to request to speak with someone regarding a bill that her husband received. She stated that if she does not get a call back today she will be appealing the charges with their insurance company. Please advise Ph# [phone]

## 2018-07-30 NOTE — Telephone Encounter (Signed)
Patient is very upset because he was advised if he saw the Pharm -D this would not be any cost for this service and patient he was advised by PCP there was no charge, and that the Pharm-D advised that he would not be charged . Patient has received a bill and would like this rectified.Patient very upset raising his voice on phone that this needs to be fixed because he ask several times would there be a charge for seeing a Pharm-D.

## 2018-07-30 NOTE — Telephone Encounter (Signed)
Spoke with pt's wife in regards to the bills the pt received for his appts with Catie, [name] in our office. Pt's wife stated that the pt was told by two different people in our office that the appts were free of charge and that was the only reason the pt agreed to the appts. I apologized to the wife for the inaccurate information that he was given. The wife went on the say that she went ahead and paid the charges of $428.40 for the visits before she realized that the pt was told they were not supposed to be charged for them. Pt explained to me that the charges show that it was billed like an office visit with Dr. [name]. I explained to the pt that it has Dr. Lupita Dawn name on it because Catie is not an MD and she has to have all her appts signed off by the MD the pt sees. The wife stated that she would call her insurance company and give them the CPT codes that were filed and make sure nothing was fraudulent. The pt stated that she would give [place] a call back once she spoke with her insurance company.

## 2018-07-30 NOTE — Telephone Encounter (Signed)
Copied from [place] [id number]. Topic: Quick Communication - See Telephone Encounter >> Jul 30, 2018 12:07 PM Rutherford Nail, NT wrote: CRM for notification. See Telephone encounter for: 07/30/18. Patient very addiment about speaking to someone in the office asap regarding a bill he received after being told her would not get a bill. Offered to transfer to billing and he said that he wanted someone in the office because that was where he was told her would not receive the bill. Please advise.

## 2018-07-31 ENCOUNTER — Other Ambulatory Visit: Payer: Self-pay | Admitting: Internal Medicine

## 2018-08-03 NOTE — Telephone Encounter (Signed)
The patient's information was forwarded to charge correction, by the coder.

## 2018-08-06 NOTE — Telephone Encounter (Signed)
Notified patient that this has been given to coding for resubmission.

## 2018-08-20 ENCOUNTER — Other Ambulatory Visit: Payer: Self-pay | Admitting: Internal Medicine

## 2018-08-23 ENCOUNTER — Other Ambulatory Visit: Payer: Self-pay | Admitting: Internal Medicine

## 2018-09-23 ENCOUNTER — Ambulatory Visit: Payer: 59 | Admitting: Internal Medicine

## 2018-10-06 ENCOUNTER — Other Ambulatory Visit: Payer: Self-pay

## 2018-10-06 DIAGNOSIS — E1165 Type 2 diabetes mellitus with hyperglycemia: Secondary | ICD-10-CM

## 2018-10-06 MED ORDER — INSULIN LISPRO PROT & LISPRO (75-25 MIX) 100 UNIT/ML KWIKPEN: PEN_INJECTOR | SUBCUTANEOUS | 0 refills | Status: DC

## 2018-10-07 MED ORDER — INSULIN LISPRO PROT & LISPRO (75-25 MIX) 100 UNIT/ML KWIKPEN: PEN_INJECTOR | SUBCUTANEOUS | 0 refills | Status: DC

## 2018-10-07 MED ORDER — GABAPENTIN 100 MG PO CAPS: 100.0000 mg | ORAL_CAPSULE | Freq: Three times a day (TID) | ORAL | 0 refills | Status: DC

## 2018-10-14 ENCOUNTER — Other Ambulatory Visit: Payer: Self-pay

## 2018-10-14 DIAGNOSIS — E1165 Type 2 diabetes mellitus with hyperglycemia: Secondary | ICD-10-CM

## 2018-10-15 MED ORDER — GABAPENTIN 100 MG PO CAPS: 100.0000 mg | ORAL_CAPSULE | Freq: Three times a day (TID) | ORAL | 0 refills | Status: DC

## 2018-10-15 MED ORDER — INSULIN LISPRO PROT & LISPRO (75-25 MIX) 100 UNIT/ML KWIKPEN: PEN_INJECTOR | SUBCUTANEOUS | 0 refills | Status: DC

## 2018-10-20 ENCOUNTER — Telehealth: Payer: Self-pay | Admitting: Internal Medicine

## 2018-10-20 ENCOUNTER — Other Ambulatory Visit: Payer: Self-pay | Admitting: Internal Medicine

## 2018-10-20 DIAGNOSIS — E1165 Type 2 diabetes mellitus with hyperglycemia: Secondary | ICD-10-CM

## 2018-10-20 MED ORDER — INSULIN LISPRO PROT & LISPRO (75-25 MIX) 100 UNIT/ML KWIKPEN: PEN_INJECTOR | SUBCUTANEOUS | 0 refills | Status: DC

## 2018-10-20 NOTE — Telephone Encounter (Signed)
Copied from [place] [phone]. Topic: Quick Communication - See Telephone Encounter >> Oct 20, 2018 11:41 AM Vernona Rieger wrote: CRM for notification. See Telephone encounter for: 10/20/18. Patient said that [facility] has still not received his Insulin Lispro Prot & Lispro (HUMALOG MIX 75/25 KWIKPEN) (75-25) 100 UNIT/ML Kwikpen. Can this be re-sent? The doctor line number is [phone]

## 2018-10-20 NOTE — Telephone Encounter (Signed)
Medication has been sent and and pt is aware.

## 2018-11-20 ENCOUNTER — Telehealth: Payer: Self-pay

## 2018-11-20 NOTE — Telephone Encounter (Signed)
Called left message to return my call.

## 2018-11-20 NOTE — Telephone Encounter (Signed)
Pt would like for you to call him back on Monday between 10-11 am. Pt is out at the store and cannot talk right now he has to get back home with his wife.  Call pt @ [phone] Thank you!

## 2018-11-20 NOTE — Telephone Encounter (Signed)
Copied from [place] [phone]. Topic: General - Other >> Nov 20, 2018 10:36 AM Lennox Solders wrote: Reason for CRM: pt is calling and would like kathy to return his call concerning a bill , pt said he spoke with [place] a while ago. Pt did not elaborate further

## 2018-11-23 NOTE — Telephone Encounter (Signed)
Called patient he still upset due to the bill he is receiving for seeing the Pharm-D he was told he would not receive a bill. See previous notes.

## 2018-11-25 NOTE — Telephone Encounter (Signed)
When we looked into it, it appeared that the majority of his bill was the visit with Dr. [name].   I bill the same level as a nurse visit [phone]) and there is no way for [place] to know which insurances charge a copay for this level of service.   Would recommend elevating this to [place].

## 2018-11-26 ENCOUNTER — Telehealth: Payer: Self-pay | Admitting: Internal Medicine

## 2018-11-26 ENCOUNTER — Other Ambulatory Visit: Payer: Self-pay

## 2018-11-26 DIAGNOSIS — E1165 Type 2 diabetes mellitus with hyperglycemia: Secondary | ICD-10-CM

## 2018-11-26 MED ORDER — INSULIN LISPRO PROT & LISPRO (75-25 MIX) 100 UNIT/ML KWIKPEN: PEN_INJECTOR | SUBCUTANEOUS | 0 refills | Status: DC

## 2018-11-26 MED ORDER — GABAPENTIN 100 MG PO CAPS: 100.0000 mg | ORAL_CAPSULE | Freq: Three times a day (TID) | ORAL | 0 refills | Status: DC

## 2018-11-26 NOTE — Telephone Encounter (Signed)
Pt states he needs to have labs for diabetes. Order is needed please and Thank you!  Pt states he needs to come into ofc so Dr [name] can check his feet. Please advise? Let me know so I know how to schedule. Thank you!

## 2018-12-01 ENCOUNTER — Telehealth: Payer: Self-pay

## 2018-12-01 DIAGNOSIS — E785 Hyperlipidemia, unspecified: Secondary | ICD-10-CM

## 2018-12-01 DIAGNOSIS — E1129 Type 2 diabetes mellitus with other diabetic kidney complication: Secondary | ICD-10-CM

## 2018-12-01 DIAGNOSIS — E1165 Type 2 diabetes mellitus with hyperglycemia: Secondary | ICD-10-CM

## 2018-12-01 NOTE — Telephone Encounter (Signed)
Pt has lab and office visit scheduled with the provider.  Nina,cma

## 2018-12-01 NOTE — Telephone Encounter (Signed)
Fasting labs and urinaysis ordered

## 2018-12-01 NOTE — Telephone Encounter (Signed)
Pt is scheduled for 12/03/2018 for lab work and was scheduled for an office visit as well.

## 2018-12-03 ENCOUNTER — Other Ambulatory Visit (INDEPENDENT_AMBULATORY_CARE_PROVIDER_SITE_OTHER): Payer: 59

## 2018-12-03 ENCOUNTER — Other Ambulatory Visit: Payer: Self-pay

## 2018-12-03 DIAGNOSIS — E1165 Type 2 diabetes mellitus with hyperglycemia: Secondary | ICD-10-CM

## 2018-12-03 DIAGNOSIS — E1129 Type 2 diabetes mellitus with other diabetic kidney complication: Secondary | ICD-10-CM | POA: Diagnosis not present

## 2018-12-03 DIAGNOSIS — R809 Proteinuria, unspecified: Secondary | ICD-10-CM | POA: Diagnosis not present

## 2018-12-03 DIAGNOSIS — E785 Hyperlipidemia, unspecified: Secondary | ICD-10-CM

## 2018-12-03 LAB — COMPREHENSIVE METABOLIC PANEL
ALT: 13 U/L (ref 0–53)
AST: 9 U/L (ref 0–37)
Albumin: 4.2 g/dL (ref 3.5–5.2)
Alkaline Phosphatase: 70 U/L (ref 39–117)
BUN: 19 mg/dL (ref 6–23)
CO2: 28 mEq/L (ref 19–32)
Calcium: 9.5 mg/dL (ref 8.4–10.5)
Chloride: 101 mEq/L (ref 96–112)
Creatinine, Ser: 0.89 mg/dL (ref 0.40–1.50)
GFR: 88.83 mL/min (ref >60.00)
Glucose, Bld: 99 mg/dL (ref 70–99)
Potassium: 4.7 mEq/L (ref 3.5–5.1)
Sodium: 137 mEq/L (ref 135–145)
Total Bilirubin: 0.4 mg/dL (ref 0.2–1.2)
Total Protein: 7.2 g/dL (ref 6.0–8.3)

## 2018-12-03 LAB — LIPID PANEL
Cholesterol: 239 mg/dL — ABNORMAL HIGH (ref 0–200)
HDL: 28.9 mg/dL — ABNORMAL LOW (ref >39.00)
NonHDL: 210.09
Total CHOL/HDL Ratio: 8
Triglycerides: 208 mg/dL — ABNORMAL HIGH (ref 0.0–149.0)
VLDL: 41.6 mg/dL — ABNORMAL HIGH (ref 0.0–40.0)

## 2018-12-03 LAB — LDL CHOLESTEROL, DIRECT: Direct LDL: 151 mg/dL

## 2018-12-03 LAB — HEMOGLOBIN A1C: Hgb A1c MFr Bld: 8 % — ABNORMAL HIGH (ref 4.6–6.5)

## 2018-12-03 LAB — MICROALBUMIN / CREATININE URINE RATIO
Creatinine,U: 92.8 mg/dL
Microalb Creat Ratio: 2.8 mg/g (ref 0.0–30.0)
Microalb, Ur: 2.6 mg/dL — ABNORMAL HIGH (ref 0.0–1.9)

## 2018-12-04 ENCOUNTER — Ambulatory Visit (INDEPENDENT_AMBULATORY_CARE_PROVIDER_SITE_OTHER): Payer: Medicare Other | Admitting: Internal Medicine

## 2018-12-04 ENCOUNTER — Encounter: Payer: Self-pay | Admitting: Internal Medicine

## 2018-12-04 DIAGNOSIS — K222 Esophageal obstruction: Secondary | ICD-10-CM

## 2018-12-04 DIAGNOSIS — R600 Localized edema: Secondary | ICD-10-CM | POA: Diagnosis not present

## 2018-12-04 DIAGNOSIS — E1165 Type 2 diabetes mellitus with hyperglycemia: Secondary | ICD-10-CM

## 2018-12-04 DIAGNOSIS — I152 Hypertension secondary to endocrine disorders: Secondary | ICD-10-CM

## 2018-12-04 MED ORDER — FUROSEMIDE 20 MG PO TABS: 20.0000 mg | ORAL_TABLET | ORAL | 3 refills | Status: DC

## 2018-12-04 NOTE — Patient Instructions (Addendum)
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  This can be confirmed with a "barium swallow study" which is done at the hospital when you are ready  I recommend eating your meals more slowly ,  And moistening your food with water before you swallow   I am adding a diuretic called furosemide to take every other day for the fluid in your legs

## 2018-12-04 NOTE — Progress Notes (Signed)
Subjective:  Patient ID: Connor Ross, male    DOB: May 09, 1966  Age: 53 y.o. MRN: [id number]  CC: Diagnoses of Uncontrolled type 2 diabetes mellitus with hyperglycemia (Connor Ross), Hypertension due to endocrine disorder, Lower extremity edema, and Esophageal stricture were pertinent to this visit.  HPI [name] presents for follow up and management f type 2 DM, insulin dependent   a1c has risen from 6.8 to 8.0  . Last saw Connor Ross in January   Taking 75/25  50 units in the am and 100 units in the pm, jardiance 25, mg glipizide 10- mg bid    Afternoon cbgs > 200 if he eats lunch.  Under 150 if he skips lunch. Has to use quick pens instead of syringes  due to decreased mobility of arms  Podiatry prescribing a cream that he applied to his big toe,  Which became infected  After appling it ot the callous  takign flomax .  Has internitent urinary retention  Discussed the change in ace inhibitor ad an attack of dyspnea vs panic attack recently  Food getting hung up in throat near xiphoid.  Happens regularly with  potatoes,  Rice or bread   Outpatient Medications Prior to Visit  Medication Sig Dispense Refill  . aspirin (ASPIRIN ADULT LOW STRENGTH) 81 MG EC tablet Take 1 tablet (81 mg total) by mouth daily. Swallow whole. 90 tablet 3  . blood glucose meter kit and supplies Dispense based on patient and insurance preference. Use up to four times daily as directed. (FOR ICD-10 E10.9, E11.9). Use to check blood sugar three times daily 1 each 0  . empagliflozin (JARDIANCE) 25 MG TABS tablet Take 25 mg by mouth daily. 90 tablet 3  . gabapentin (NEURONTIN) 100 MG capsule Take 1 capsule (100 mg total) by mouth 3 (three) times daily. 270 capsule 0  . glipiZIDE (GLUCOTROL) 10 MG tablet TAKE 1 TABLET BY MOUTH 2  TIMES DAILY BEFORE MEALS 180 tablet 1  . Insulin Lispro Prot & Lispro (HUMALOG MIX 75/25 KWIKPEN) (75-25) 100 UNIT/ML Kwikpen INJECT SUBCUTANEOUSLY 50  UNITS IN THE MORNING AND   120 UNITS IN THE EVENING  PRE MEAL 135 mL 0  . Insulin Pen Needle 32G X 6 MM MISC Use daily with insulin pen 50 each 0  . Lancets (FREESTYLE) lancets 1 each by Other route 2 (two) times daily. DX 250.02 100 each 12  . lisinopril (PRINIVIL,ZESTRIL) 40 MG tablet TAKE 1 TABLET BY MOUTH  DAILY 90 tablet 1  . NONFORMULARY OR COMPOUNDED ITEM [facility]:  Wart Cream - Cimetidine 2%, Deoxy-D-Glucose 0.2%, Fluorouracil-5 5%, Salicylic Acid, apply to the affected area 2 times daily. 120 each 1  . ONETOUCH VERIO test strip USE TO CHECK BLOOD SUGAR 3  TIMES DAILY 300 each 2  . tamsulosin (FLOMAX) 0.4 MG CAPS capsule TAKE 1 CAPSULE BY MOUTH  DAILY 90 capsule 1  . traMADol (ULTRAM) 50 MG tablet Take 1 tablet (50 mg total) by mouth every 6 (six) hours as needed for moderate pain. For spinal stenosis 90 tablet 5  . diclofenac (VOLTAREN) 75 MG EC tablet Take 1 tablet (75 mg total) by mouth 2 (two) times daily. (Patient not taking: Reported on 12/04/2018) 60 tablet 0  . fluorouracil (EFUDEX) 5 % cream Apply topically 2 (two) times daily. (Patient not taking: Reported on 12/04/2018) 40 g 1  . Zn-Pyg Afri-Nettle-Saw Palmet (SAW PALMETTO COMPLEX PO) Take 1 tablet by mouth.     No facility-administered medications prior  to visit.     Review of Systems;  Patient denies headache, fevers, malaise, unintentional weight loss, skin rash, eye pain, sinus congestion and sinus pain, sore throat, dysphagia,  hemoptysis , cough, dyspnea, wheezing, chest pain, palpitations, orthopnea, edema, abdominal pain, nausea, melena, diarrhea, constipation, flank pain, dysuria, hematuria, urinary  Frequency, nocturia, numbness, tingling, seizures,  Focal weakness, Loss of consciousness,  Tremor, insomnia, depression, anxiety, and suicidal ideation.      Objective:  BP 122/66 (BP Location: Left Arm, Patient Position: Sitting, Cuff Size: Large)   Pulse 91   Temp 98.2 F (36.8 C) (Oral)   Resp 16   Ht 5' 11"  (1.803 m)   Wt (!) 316  lb (143.3 kg)   SpO2 91%   BMI 44.07 kg/m   BP Readings from Last 3 Encounters:  12/04/18 122/66  07/13/18 128/81  06/01/18 (!) 142/85    Wt Readings from Last 3 Encounters:  12/04/18 (!) 316 lb (143.3 kg)  07/13/18 (!) 322 lb 3.2 oz (146.1 kg)  06/01/18 (!) 323 lb 3.2 oz (146.6 kg)    General appearance: alert, cooperative and appears stated age Ears: normal TM's and external ear canals both ears Throat: lips, mucosa, and tongue normal; teeth and gums normal Neck: no adenopathy, no carotid bruit, supple, symmetrical, trachea midline and thyroid not enlarged, symmetric, no tenderness/mass/nodules Back: symmetric, no curvature. ROM normal. No CVA tenderness. Lungs: clear to auscultation bilaterally Heart: regular rate and rhythm, S1, S2 normal, no murmur, click, rub or gallop Abdomen: soft, non-tender; bowel sounds normal; no masses,  no organomegaly Pulses: 2+ and symmetric Skin: Skin color, texture, turgor normal. No rashes or lesions Lymph nodes: Cervical, supraclavicular, and axillary nodes normal.  Lab Results  Component Value Date   HGBA1C 8.0 (H) 12/03/2018   HGBA1C 6.8 (A) 07/13/2018   HGBA1C 10.3 (H) 03/20/2018    Lab Results  Component Value Date   CREATININE 0.89 12/03/2018   CREATININE 0.86 03/20/2018   CREATININE 0.82 03/03/2017    Lab Results  Component Value Date   WBC 7.2 02/06/2015   HGB 14.6 02/06/2015   HCT 43.6 02/06/2015   PLT 241.0 02/06/2015   GLUCOSE 99 12/03/2018   CHOL 239 (H) 12/03/2018   TRIG 208.0 (H) 12/03/2018   HDL 28.90 (L) 12/03/2018   LDLDIRECT 151.0 12/03/2018   LDLCALC 159 (H) 02/22/2016   ALT 13 12/03/2018   AST 9 12/03/2018   NA 137 12/03/2018   K 4.7 12/03/2018   CL 101 12/03/2018   CREATININE 0.89 12/03/2018   BUN 19 12/03/2018   CO2 28 12/03/2018   TSH 3.28 05/10/2015   PSA 0.45 03/25/2018   HGBA1C 8.0 (H) 12/03/2018   MICROALBUR 2.6 (H) 12/03/2018     Assessment & Plan:   Problem List Items Addressed  This Visit    DM (diabetes mellitus), type 2, uncontrolled ([place])    Not currently control.  A1c  Has risen from 6.8 to 8.0.   Goal A1c <7%. .  Control is difficult because he skips lunch frequenty- Continue Novolin 75/25 50 units with breakfast; increase to 123 units with supper - Continue Jardiance 25 mg daily and glipizide 10 mg BID. Low threshold for reduction of glipizide if hypoglycemia develops        Hypertension    I am making a decision to change patient's ACE Inhibitor to an ARB  based on increased reports of  angioedema.  I also advised patient to to take it at night instead of  morning,  as recent studies have shown a reduction in incidence of heart attacks and strokes.       Relevant Medications   furosemide (LASIX) 20 MG tablet   Lower extremity edema    Starting furosemide every other day       Esophageal stricture    Suggested by new onset dysphagia for solids of certain textures.  He has  Deferred workup .  Advised to start a daily PPI          I have discontinued Dimitrius Steedman. [place]'s diclofenac, Zn-Pyg Afri-Nettle-Saw Palmet (SAW PALMETTO COMPLEX PO), and fluorouracil. I am also having him start on furosemide. Additionally, I am having him maintain his freestyle, Insulin Pen Needle, aspirin, traMADol, NONFORMULARY OR COMPOUNDED ITEM, blood glucose meter kit and supplies, empagliflozin, lisinopril, glipiZIDE, OneTouch Verio, tamsulosin, gabapentin, and Insulin Lispro Prot & Lispro.  Meds ordered this encounter  Medications  . furosemide (LASIX) 20 MG tablet    Sig: Take 1 tablet (20 mg total) by mouth every other day.    Dispense:  45 tablet    Refill:  3    Medications Discontinued During This Encounter  Medication Reason  . diclofenac (VOLTAREN) 75 MG EC tablet Error  . fluorouracil (EFUDEX) 5 % cream Error  . Zn-Pyg Afri-Nettle-Saw Palmet (SAW PALMETTO COMPLEX PO) Error   A total of 40 minutes was spent with patient more than half of which was spent in  counseling patient on the above mentioned issues , reviewing and explaining recent labs and imaging studies done, and coordination of care.  Follow-up: No follow-ups on file.   [name], MD

## 2018-12-06 DIAGNOSIS — K222 Esophageal obstruction: Secondary | ICD-10-CM | POA: Insufficient documentation

## 2018-12-06 DIAGNOSIS — R600 Localized edema: Secondary | ICD-10-CM | POA: Insufficient documentation

## 2018-12-06 NOTE — Assessment & Plan Note (Signed)
Suggested by new onset dysphagia for solids of certain textures.  He has  Deferred workup .  Advised to start a daily PPI

## 2018-12-06 NOTE — Assessment & Plan Note (Signed)
Starting furosemide every other day

## 2018-12-06 NOTE — Assessment & Plan Note (Signed)
Not currently control.  A1c  Has risen from 6.8 to 8.0.   Goal A1c <7%. .  Control is difficult because he skips lunch frequenty- Continue Novolin 75/25 50 units with breakfast; increase to 123 units with supper - Continue Jardiance 25 mg daily and glipizide 10 mg BID. Low threshold for reduction of glipizide if hypoglycemia develops

## 2018-12-06 NOTE — Assessment & Plan Note (Signed)
I am making a decision to change patient's ACE Inhibitor to an ARB  based on increased reports of  angioedema.  I also advised patient to to take it at night instead of morning,  as recent studies have shown a reduction in incidence of heart attacks and strokes.  

## 2018-12-08 ENCOUNTER — Other Ambulatory Visit: Payer: Self-pay | Admitting: Internal Medicine

## 2018-12-09 ENCOUNTER — Telehealth: Payer: Self-pay

## 2018-12-09 MED ORDER — TELMISARTAN 80 MG PO TABS: 80.0000 mg | ORAL_TABLET | Freq: Every day | ORAL | 0 refills | Status: DC

## 2018-12-09 NOTE — Telephone Encounter (Signed)
Refill request for lisinopril.

## 2018-12-09 NOTE — Telephone Encounter (Signed)
Copied from [place] [id number]. Topic: General - Inquiry >> Dec 09, 2018  3:11 PM Richardo Priest, [place] wrote: Reason for CRM: Elie Goody with [facility] calling in to get precription for all C-Pap supplies for patient. Please fax to [phone].

## 2018-12-10 NOTE — Telephone Encounter (Signed)
Faxed

## 2018-12-11 ENCOUNTER — Telehealth: Payer: Self-pay | Admitting: Internal Medicine

## 2018-12-11 NOTE — Telephone Encounter (Signed)
CPAP and supplies ordered Dec 11 2018 through [facility]

## 2018-12-14 NOTE — Telephone Encounter (Signed)
Order has been faxed

## 2018-12-16 ENCOUNTER — Telehealth: Payer: Self-pay

## 2018-12-16 NOTE — Telephone Encounter (Signed)
Received a refill request for Losartan from [facility] but it's Telmisartan that I see in the pt's current medication list.

## 2018-12-16 NOTE — Telephone Encounter (Signed)
Mr R was giving [place] confusing info about local pharmacy availability  Of losartan vs telmisartan , etc so he is taking lisinopril until either of the other ones is filled.  If [facility] has losartan,  Go ahead and refill it and let [place] know to take ONLY the losartan  And not fill the telmisartan locally

## 2018-12-17 MED ORDER — LOSARTAN POTASSIUM 50 MG PO TABS: 50.0000 mg | ORAL_TABLET | Freq: Every day | ORAL | 1 refills | Status: DC

## 2018-12-17 NOTE — Addendum Note (Signed)
Addended by: Adair Laundry on: 12/17/2018 11:47 AM   Modules accepted: Orders

## 2018-12-17 NOTE — Telephone Encounter (Signed)
What dose of losartan would you like for me to send in?

## 2018-12-17 NOTE — Telephone Encounter (Signed)
Medication has been sent in per Dr. Lupita Dawn order.

## 2018-12-17 NOTE — Telephone Encounter (Signed)
50 mg losartan,

## 2018-12-21 DIAGNOSIS — G4733 Obstructive sleep apnea (adult) (pediatric): Secondary | ICD-10-CM

## 2018-12-21 DIAGNOSIS — Z9989 Dependence on other enabling machines and devices: Secondary | ICD-10-CM

## 2018-12-22 ENCOUNTER — Telehealth: Payer: Self-pay | Admitting: Internal Medicine

## 2018-12-22 NOTE — Telephone Encounter (Signed)
Printed and placed in quick sign folder 

## 2018-12-22 NOTE — Telephone Encounter (Signed)
Please confirm that the DME for CPAP hAS PRINTED AND IF NOT PLESAE PRINT

## 2018-12-25 NOTE — Telephone Encounter (Signed)
Patient states this is no longer needed.

## 2019-01-08 ENCOUNTER — Other Ambulatory Visit: Payer: Self-pay | Admitting: Internal Medicine

## 2019-01-24 ENCOUNTER — Other Ambulatory Visit: Payer: Self-pay | Admitting: Internal Medicine

## 2019-02-12 ENCOUNTER — Other Ambulatory Visit: Payer: Self-pay | Admitting: Internal Medicine

## 2019-02-23 ENCOUNTER — Telehealth: Payer: Self-pay | Admitting: Internal Medicine

## 2019-02-23 ENCOUNTER — Other Ambulatory Visit: Payer: Self-pay | Admitting: Internal Medicine

## 2019-02-23 DIAGNOSIS — M5416 Radiculopathy, lumbar region: Secondary | ICD-10-CM

## 2019-02-23 DIAGNOSIS — M48061 Spinal stenosis, lumbar region without neurogenic claudication: Secondary | ICD-10-CM

## 2019-02-23 NOTE — Telephone Encounter (Signed)
My Chart message sent

## 2019-02-23 NOTE — Telephone Encounter (Signed)
Refilled: 04/15/2018 Last OV: 12/04/2018 Next OV: not scheduled

## 2019-02-26 ENCOUNTER — Telehealth: Payer: Self-pay | Admitting: Internal Medicine

## 2019-02-26 DIAGNOSIS — E785 Hyperlipidemia, unspecified: Secondary | ICD-10-CM

## 2019-02-26 DIAGNOSIS — R5383 Other fatigue: Secondary | ICD-10-CM

## 2019-02-26 DIAGNOSIS — E1165 Type 2 diabetes mellitus with hyperglycemia: Secondary | ICD-10-CM

## 2019-02-26 NOTE — Telephone Encounter (Signed)
Pt would like A1c labs done before appt on 04/23/2019. Order is needed please and Thank you!

## 2019-02-27 NOTE — Telephone Encounter (Signed)
Fasting labs ordered

## 2019-03-09 DIAGNOSIS — R5383 Other fatigue: Secondary | ICD-10-CM

## 2019-03-13 ENCOUNTER — Other Ambulatory Visit: Payer: Self-pay | Admitting: Internal Medicine

## 2019-03-13 DIAGNOSIS — E1165 Type 2 diabetes mellitus with hyperglycemia: Secondary | ICD-10-CM

## 2019-04-19 ENCOUNTER — Other Ambulatory Visit: Payer: Self-pay

## 2019-04-19 ENCOUNTER — Other Ambulatory Visit (INDEPENDENT_AMBULATORY_CARE_PROVIDER_SITE_OTHER): Payer: 59

## 2019-04-19 DIAGNOSIS — E1165 Type 2 diabetes mellitus with hyperglycemia: Secondary | ICD-10-CM | POA: Diagnosis not present

## 2019-04-19 DIAGNOSIS — R5383 Other fatigue: Secondary | ICD-10-CM

## 2019-04-19 DIAGNOSIS — E785 Hyperlipidemia, unspecified: Secondary | ICD-10-CM | POA: Diagnosis not present

## 2019-04-19 LAB — COMPREHENSIVE METABOLIC PANEL
ALT: 14 U/L (ref 0–53)
AST: 7 U/L (ref 0–37)
Albumin: 4.2 g/dL (ref 3.5–5.2)
Alkaline Phosphatase: 71 U/L (ref 39–117)
BUN: 13 mg/dL (ref 6–23)
CO2: 25 mEq/L (ref 19–32)
Calcium: 9.3 mg/dL (ref 8.4–10.5)
Chloride: 103 mEq/L (ref 96–112)
Creatinine, Ser: 0.79 mg/dL (ref 0.40–1.50)
GFR: 101.79 mL/min (ref >60.00)
Glucose, Bld: 161 mg/dL — ABNORMAL HIGH (ref 70–99)
Potassium: 4.1 mEq/L (ref 3.5–5.1)
Sodium: 137 mEq/L (ref 135–145)
Total Bilirubin: 0.4 mg/dL (ref 0.2–1.2)
Total Protein: 6.9 g/dL (ref 6.0–8.3)

## 2019-04-19 LAB — LIPID PANEL
Cholesterol: 225 mg/dL — ABNORMAL HIGH (ref 0–200)
HDL: 25.3 mg/dL — ABNORMAL LOW (ref >39.00)
LDL Cholesterol: 173 mg/dL — ABNORMAL HIGH (ref 0–99)
NonHDL: 199.98
Total CHOL/HDL Ratio: 9
Triglycerides: 136 mg/dL (ref 0.0–149.0)
VLDL: 27.2 mg/dL (ref 0.0–40.0)

## 2019-04-19 LAB — CBC WITH DIFFERENTIAL/PLATELET
Basophils Absolute: 0.1 10*3/uL (ref 0.0–0.1)
Basophils Relative: 1 % (ref 0.0–3.0)
Eosinophils Absolute: 0.2 10*3/uL (ref 0.0–0.7)
Eosinophils Relative: 2.6 % (ref 0.0–5.0)
HCT: 44.9 % (ref 39.0–52.0)
Hemoglobin: 14.9 g/dL (ref 13.0–17.0)
Lymphocytes Relative: 23.9 % (ref 12.0–46.0)
Lymphs Abs: 1.9 10*3/uL (ref 0.7–4.0)
MCHC: 33.2 g/dL (ref 30.0–36.0)
MCV: 90.6 fl (ref 78.0–100.0)
Monocytes Absolute: 0.7 10*3/uL (ref 0.1–1.0)
Monocytes Relative: 8.7 % (ref 3.0–12.0)
Neutro Abs: 5 10*3/uL (ref 1.4–7.7)
Neutrophils Relative %: 63.8 % (ref 43.0–77.0)
Platelets: 259 10*3/uL (ref 150.0–400.0)
RBC: 4.95 Mil/uL (ref 4.22–5.81)
RDW: 13.9 % (ref 11.5–15.5)
WBC: 7.8 10*3/uL (ref 4.0–10.5)

## 2019-04-19 LAB — HEMOGLOBIN A1C: Hgb A1c MFr Bld: 7.6 % — ABNORMAL HIGH (ref 4.6–6.5)

## 2019-04-19 LAB — TSH: TSH: 2.43 u[IU]/mL (ref 0.35–4.50)

## 2019-04-21 ENCOUNTER — Other Ambulatory Visit: Payer: Self-pay

## 2019-04-22 LAB — TESTOS,TOTAL,FREE AND SHBG (FEMALE)
Free Testosterone: 40.7 pg/mL (ref 35.0–155.0)
Sex Hormone Binding: 29 nmol/L (ref 10–50)
Testosterone, Total, LC-MS-MS: 261 ng/dL (ref 250–1100)

## 2019-04-23 ENCOUNTER — Ambulatory Visit (INDEPENDENT_AMBULATORY_CARE_PROVIDER_SITE_OTHER): Payer: 59 | Admitting: Internal Medicine

## 2019-04-23 ENCOUNTER — Other Ambulatory Visit: Payer: Self-pay

## 2019-04-23 ENCOUNTER — Encounter: Payer: Self-pay | Admitting: Internal Medicine

## 2019-04-23 VITALS — BP 122/74 | HR 84 | Temp 97.1°F | Resp 15 | Ht 71.0 in | Wt 313.6 lb

## 2019-04-23 DIAGNOSIS — E1129 Type 2 diabetes mellitus with other diabetic kidney complication: Secondary | ICD-10-CM

## 2019-04-23 DIAGNOSIS — E11621 Type 2 diabetes mellitus with foot ulcer: Secondary | ICD-10-CM

## 2019-04-23 DIAGNOSIS — L97521 Non-pressure chronic ulcer of other part of left foot limited to breakdown of skin: Secondary | ICD-10-CM | POA: Diagnosis not present

## 2019-04-23 DIAGNOSIS — E1165 Type 2 diabetes mellitus with hyperglycemia: Secondary | ICD-10-CM

## 2019-04-23 DIAGNOSIS — R809 Proteinuria, unspecified: Secondary | ICD-10-CM | POA: Diagnosis not present

## 2019-04-23 HISTORY — DX: Type 2 diabetes mellitus with foot ulcer: E11.621

## 2019-04-23 MED ORDER — JARDIANCE 25 MG PO TABS: 25.0000 mg | ORAL_TABLET | Freq: Every day | ORAL | 3 refills | Status: DC

## 2019-04-23 NOTE — Patient Instructions (Signed)
Your diabetes is under improving control!  Good job!    You can increase your dose if insulin by 3 units when you anticipate a high starch snack or meal   You have a diabetic foot ulcer that has stalled in its healing . It is crucial that this gets healed before it leads to a bone infection and gangrene  I have made a referral to the wound care center to get it taken care of.

## 2019-04-23 NOTE — Progress Notes (Signed)
Subjective:  Patient ID: Connor Ross, male    DOB: 09/08/1965  Age: 53 y.o. MRN: [id number]  CC: The primary encounter diagnosis was 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 type 2 diabetes mellitus, limited to breakdown of skin ([place]). Diagnoses of Uncontrolled type 2 diabetes mellitus with hyperglycemia ([place]) and Microalbuminuria due to type 2 diabetes mellitus ([place]) were also pertinent to this visit.  HPI Connor Ross presents for follow up on uncontrolled diabetes.  h follow up on diabetes.  Patient has no complaints today.  Patient is following a low glycemic index diet and taking all prescribed medications regularly without side effects.  He is using mixed insulin twice daily  jardiance and Fasting sugars have been under less than 140 most of the time and post prandials have been under 160 except on rare occasions. Patient is not exercising due to chronic joint pain  and intentionally trying to lose weight .  Patient has had an eye exam in the last 12 months and checks feet regularly for signs of infection.  He has been trying to heal an ulcer on his left great toe that started after using a prescribed ointment on the callous recommended at his last podiatry appointment. Patient does not walk barefoot outside,  And denies an numbness tingling or burning in feet. Patient is up to date on all recommended vaccinations  Statin intolerance   Lab Results  Component Value Date   HGBA1C 7.6 (H) 04/19/2019   50 units of  humalog  In am and 100 at night   Left great toe with pressure ulcer caused by use of podiatry  recommended  [facility] wart Cream to use a plantar's wort.   Outpatient Medications Prior to Visit  Medication Sig Dispense Refill  . aspirin (ASPIRIN ADULT LOW STRENGTH) 81 MG EC tablet Take 1 tablet (81 mg total) by mouth daily. Swallow whole. 90 tablet 3  . blood glucose meter kit and supplies Dispense based on patient and insurance preference. Use up to  four times daily as directed. (FOR ICD-10 E10.9, E11.9). Use to check blood sugar three times daily 1 each 0  . furosemide (LASIX) 20 MG tablet TAKE 1 TABLET BY MOUTH  EVERY OTHER DAY 45 tablet 3  . gabapentin (NEURONTIN) 100 MG capsule TAKE 1 CAPSULE BY MOUTH 3  TIMES DAILY 270 capsule 1  . glipiZIDE (GLUCOTROL) 10 MG tablet TAKE 1 TABLET BY MOUTH TWO  TIMES DAILY BEFORE MEALS 180 tablet 1  . Insulin Lispro Prot & Lispro (HUMALOG MIX 75/25 KWIKPEN) (75-25) 100 UNIT/ML Kwikpen INJECT SUBCUTANEOUSLY 50  UNITS AND 120 UNITS IN THE  EVENING PREMEALS 135 mL 3  . Insulin Pen Needle 32G X 6 MM MISC Use daily with insulin pen 50 each 0  . Lancets (FREESTYLE) lancets 1 each by Other route 2 (two) times daily. DX 250.02 100 each 12  . losartan (COZAAR) 50 MG tablet Take 1 tablet (50 mg total) by mouth daily. 90 tablet 1  . NONFORMULARY OR COMPOUNDED ITEM [facility]:  Wart Cream - Cimetidine 2%, Deoxy-D-Glucose 0.2%, Fluorouracil-5 5%, Salicylic Acid, apply to the affected area 2 times daily. 120 each 1  . ONETOUCH VERIO test strip USE TO CHECK BLOOD SUGAR 3  TIMES DAILY 300 each 2  . tamsulosin (FLOMAX) 0.4 MG CAPS capsule TAKE 1 CAPSULE BY MOUTH  DAILY 90 capsule 3  . traMADol (ULTRAM) 50 MG tablet TAKE 1 TABLET BY MOUTH  EVERY 6 HOURS AS  NEEDED FOR MODERATE PAIN FOR SPINAL  STENOSIS. 90 tablet 2  . empagliflozin (JARDIANCE) 25 MG TABS tablet Take 25 mg by mouth daily. 90 tablet 3  . telmisartan (MICARDIS) 80 MG tablet Take 1 tablet (80 mg total) by mouth at bedtime. (Patient not taking: Reported on 04/23/2019) 90 tablet 0   No facility-administered medications prior to visit.     Review of Systems;  Patient denies headache, fevers, malaise, unintentional weight loss, skin rash, eye pain, sinus congestion and sinus pain, sore throat, dysphagia,  hemoptysis , cough, dyspnea, wheezing, chest pain, palpitations, orthopnea, edema, abdominal pain, nausea, melena, diarrhea, constipation, flank pain,  dysuria, hematuria, urinary  Frequency, nocturia, numbness, tingling, seizures,  Focal weakness, Loss of consciousness,  Tremor, insomnia, depression, anxiety, and suicidal ideation.      Objective:  BP 122/74 (BP Location: Left Arm, Patient Position: Sitting, Cuff Size: Large)   Pulse 84   Temp (!) 97.1 F (36.2 C) (Temporal)   Resp 15   Ht 5' 11"  (1.803 m)   Wt (!) 313 lb 9.6 oz (142.2 kg)   SpO2 93%   BMI 43.74 kg/m   BP Readings from Last 3 Encounters:  04/23/19 122/74  12/04/18 122/66  07/13/18 128/81    Wt Readings from Last 3 Encounters:  04/23/19 (!) 313 lb 9.6 oz (142.2 kg)  12/04/18 (!) 316 lb (143.3 kg)  07/13/18 (!) 322 lb 3.2 oz (146.1 kg)    General appearance: alert, cooperative and appears stated age Ears: normal TM's and external ear canals both ears Throat: lips, mucosa, and tongue normal; teeth and gums normal Neck: no adenopathy, no carotid bruit, supple, symmetrical, trachea midline and thyroid not enlarged, symmetric, no tenderness/mass/nodules Back: symmetric, no curvature. ROM normal. No CVA tenderness. Lungs: clear to auscultation bilaterally Heart: regular rate and rhythm, S1, S2 normal, no murmur, click, rub or gallop Abdomen: soft, non-tender; bowel sounds normal; no masses,  no organomegaly Pulses: 2+ and symmetric Skin: Skin color, texture, turgor normal. No rashes or lesions Lymph nodes: Cervical, supraclavicular, and axillary nodes normal.  Lab Results  Component Value Date   HGBA1C 7.6 (H) 04/19/2019   HGBA1C 8.0 (H) 12/03/2018   HGBA1C 6.8 (A) 07/13/2018    Lab Results  Component Value Date   CREATININE 0.79 04/19/2019   CREATININE 0.89 12/03/2018   CREATININE 0.86 03/20/2018    Lab Results  Component Value Date   WBC 7.8 04/19/2019   HGB 14.9 04/19/2019   HCT 44.9 04/19/2019   PLT 259.0 04/19/2019   GLUCOSE 161 (H) 04/19/2019   CHOL 225 (H) 04/19/2019   TRIG 136.0 04/19/2019   HDL 25.30 (L) 04/19/2019   LDLDIRECT 151.0  12/03/2018   LDLCALC 173 (H) 04/19/2019   ALT 14 04/19/2019   AST 7 04/19/2019   NA 137 04/19/2019   K 4.1 04/19/2019   CL 103 04/19/2019   CREATININE 0.79 04/19/2019   BUN 13 04/19/2019   CO2 25 04/19/2019   TSH 2.43 04/19/2019   PSA 0.45 03/25/2018   HGBA1C 7.6 (H) 04/19/2019   MICROALBUR 2.6 (H) 12/03/2018  I     Assessment & Plan:   Problem List Items Addressed This Visit      Unprioritized   DM (diabetes mellitus), type 2, uncontrolled ([place])    Improved considerably.  Advised to increase evening dose of mixed insulin by 3 units for meals containing ? 2 servings of starch.      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        Relevant Medications   empagliflozin (JARDIANCE) 25 MG TABS tablet   Other Relevant Orders   AMB referral to wound care center    A total of 25 minutes of face to face time was spent with patient more than half of which was spent in counselling about the above mentioned conditions  and coordination of care   I have discontinued Donold Marotto. Anna's telmisartan. I am also having him maintain his freestyle, Insulin Pen Needle, aspirin, NONFORMULARY OR COMPOUNDED ITEM, blood glucose meter kit and supplies, OneTouch Verio, gabapentin, glipiZIDE, losartan, tamsulosin, traMADol, furosemide, Insulin Lispro Prot & Lispro, and Jardiance.  Meds ordered this encounter  Medications  . empagliflozin (JARDIANCE) 25 MG TABS tablet    Sig: Take 25 mg by  mouth daily.    Dispense:  90 tablet    Refill:  3    Medications Discontinued During This Encounter  Medication Reason  . empagliflozin (JARDIANCE) 25 MG TABS tablet Reorder  . telmisartan (MICARDIS) 80 MG tablet     Follow-up: Return in about 3 months (around 07/24/2019) for follow up diabetes.   [name], MD

## 2019-04-25 NOTE — Assessment & Plan Note (Signed)
Referral to wound care strongly advised  Ulcer has been present for several weeks and is surrounded by callous.

## 2019-04-25 NOTE — Assessment & Plan Note (Addendum)
Continue losartan ,  Goal BO 120/70  Lab Results  Component Value Date   MICROALBUR 2.6 (H) 12/03/2018

## 2019-04-25 NOTE — Assessment & Plan Note (Signed)
Improved considerably.  Advised to increase evening dose of mixed insulin by 3 units for meals containing ? 2 servings of starch.

## 2019-04-28 ENCOUNTER — Other Ambulatory Visit: Payer: Self-pay

## 2019-04-28 ENCOUNTER — Encounter: Payer: 59 | Attending: Internal Medicine | Admitting: Internal Medicine

## 2019-04-28 ENCOUNTER — Ambulatory Visit
Admission: RE | Admit: 2019-04-28 | Discharge: 2019-04-28 | Disposition: A | Discharge status: Home / Self Care | Payer: 59 | Source: Ambulatory Visit | Attending: Internal Medicine | Admitting: Internal Medicine

## 2019-04-28 ENCOUNTER — Other Ambulatory Visit: Payer: Self-pay | Admitting: Internal Medicine

## 2019-04-28 DIAGNOSIS — E1140 Type 2 diabetes mellitus with diabetic neuropathy, unspecified: Secondary | ICD-10-CM | POA: Diagnosis not present

## 2019-04-28 DIAGNOSIS — Z8249 Family history of ischemic heart disease and other diseases of the circulatory system: Secondary | ICD-10-CM | POA: Diagnosis not present

## 2019-04-28 DIAGNOSIS — S91302A Unspecified open wound, left foot, initial encounter: Secondary | ICD-10-CM | POA: Diagnosis not present

## 2019-04-28 DIAGNOSIS — B070 Plantar wart: Secondary | ICD-10-CM | POA: Insufficient documentation

## 2019-04-28 DIAGNOSIS — E1151 Type 2 diabetes mellitus with diabetic peripheral angiopathy without gangrene: Secondary | ICD-10-CM | POA: Insufficient documentation

## 2019-04-28 DIAGNOSIS — Z888 Allergy status to other drugs, medicaments and biological substances status: Secondary | ICD-10-CM | POA: Insufficient documentation

## 2019-04-28 DIAGNOSIS — Z836 Family history of other diseases of the respiratory system: Secondary | ICD-10-CM | POA: Insufficient documentation

## 2019-04-28 DIAGNOSIS — E11621 Type 2 diabetes mellitus with foot ulcer: Secondary | ICD-10-CM | POA: Diagnosis not present

## 2019-04-28 DIAGNOSIS — L97522 Non-pressure chronic ulcer of other part of left foot with fat layer exposed: Secondary | ICD-10-CM | POA: Insufficient documentation

## 2019-04-28 DIAGNOSIS — I252 Old myocardial infarction: Secondary | ICD-10-CM | POA: Diagnosis not present

## 2019-04-28 DIAGNOSIS — Z809 Family history of malignant neoplasm, unspecified: Secondary | ICD-10-CM | POA: Diagnosis not present

## 2019-04-28 DIAGNOSIS — I10 Essential (primary) hypertension: Secondary | ICD-10-CM | POA: Insufficient documentation

## 2019-04-28 DIAGNOSIS — Z885 Allergy status to narcotic agent status: Secondary | ICD-10-CM | POA: Insufficient documentation

## 2019-04-28 DIAGNOSIS — M1990 Unspecified osteoarthritis, unspecified site: Secondary | ICD-10-CM | POA: Diagnosis not present

## 2019-04-28 DIAGNOSIS — M19072 Primary osteoarthritis, left ankle and foot: Secondary | ICD-10-CM | POA: Diagnosis not present

## 2019-04-28 NOTE — Progress Notes (Signed)
ULAS, METZKER ([id number]) Visit Report for 04/28/2019 Abuse/Suicide Risk Screen Details Patient Name: Connor Ross Date of Service: 04/28/2019 1:15 PM Medical Record Number: [id number] Patient Account Number: [id number] Date of Birth/Sex: [birth year] (53 y.o. M) Treating RN: Montey Hora Primary Care Haidy Kackley: Deborra Medina Other Clinician: Referring Kristee Angus: Deborra Medina Treating Shaliyah Taite/Extender: Tito Dine in Treatment: 0 Abuse/Suicide Risk Screen Items Answer ABUSE RISK SCREEN: Has anyone close to you tried to hurt or harm you recentlyo No Do you feel uncomfortable with anyone in your familyo No Has anyone forced you do things that you didnot want to doo No Electronic Signature(s) Signed: 04/28/2019 4:44:26 PM By: Montey Hora Entered By: Montey Hora on 04/28/2019 13:32:01 Connor Ross ([id number]) -------------------------------------------------------------------------------- Activities of Daily Living Details Patient Name: Connor Ross Date of Service: 04/28/2019 1:15 PM Medical Record Number: [id number] Patient Account Number: [id number] Date of Birth/Sex: 12-Nov-1965 (53 y.o. M) Treating RN: Montey Hora Primary Care Corley Kohls: Deborra Medina Other Clinician: Referring Cheron Pasquarelli: Deborra Medina Treating Emrick Hensch/Extender: Tito Dine in Treatment: 0 Activities of Daily Living Items Answer Activities of Daily Living (Please select one for each item) Drive Automobile Completely Able Take Medications Completely Able Use Telephone Completely Able Care for Appearance Completely Able Use Toilet Completely Able Bath / Shower Completely Able Dress Self Completely Able Feed Self Completely Able Walk Completely Able Get In / Out Bed Completely Able Housework Completely Able Prepare Meals Completely Able Handle Money Completely Able Shop for Self Completely Able Electronic Signature(s) Signed:  04/28/2019 4:44:26 PM By: Montey Hora Entered By: Montey Hora on 04/28/2019 13:32:28 Connor Ross ([id number]) -------------------------------------------------------------------------------- Education Screening Details Patient Name: Connor Ross Date of Service: 04/28/2019 1:15 PM Medical Record Number: [id number] Patient Account Number: [id number] Date of Birth/Sex: Nov 16, 1965 (53 y.o. M) Treating RN: Montey Hora Primary Care Destiny Trickey: Deborra Medina Other Clinician: Referring Danni Shima: Deborra Medina Treating Ginia Rudell/Extender: Tito Dine in Treatment: 0 Primary Learner Assessed: Patient Learning Preferences/Education Level/Primary Language Learning Preference: Explanation, Demonstration Highest Education Level: High School Preferred Language: [name] Cognitive Barrier Language Barrier: No Translator Needed: No Memory Deficit: No Emotional Barrier: No Cultural/Religious Beliefs Affecting Medical Care: No Physical Barrier Impaired Vision: No Impaired Hearing: No Decreased Hand dexterity: No Knowledge/Comprehension Knowledge Level: Medium Comprehension Level: Medium Ability to understand written Medium instructions: Ability to understand verbal Medium instructions: Motivation Anxiety Level: Calm Cooperation: Cooperative Education Importance: Acknowledges Need Interest in Health Problems: Asks Questions Perception: Coherent Willingness to Engage in Self- Medium Management Activities: Readiness to Engage in Self- Medium Management Activities: Electronic Signature(s) Signed: 04/28/2019 4:44:26 PM By: Montey Hora Entered By: Montey Hora on 04/28/2019 13:33:02 Connor Ross ([id number]) -------------------------------------------------------------------------------- Fall Risk Assessment Details Patient Name: Connor Ross Date of Service: 04/28/2019 1:15 PM Medical Record Number: [id number] Patient  Account Number: [id number] Date of Birth/Sex: 1966/04/30 (53 y.o. M) Treating RN: Montey Hora Primary Care Shadrach Bartunek: Deborra Medina Other Clinician: Referring Shantia Sanford: Deborra Medina Treating Peyten Punches/Extender: Tito Dine in Treatment: 0 Fall Risk Assessment Items Have you had 2 or more falls in the last 12 monthso 0 Yes Have you had any fall that resulted in injury in the last 12 monthso 0 No FALLS RISK SCREEN History of falling - immediate or within 3 months 0 No Secondary diagnosis (Do you have 2 or more medical diagnoseso) 0 No Ambulatory aid None/bed rest/wheelchair/nurse 0 Yes Crutches/cane/walker 15 Yes Furniture 0 No Intravenous therapy Access/Saline/Heparin Lock 0 No Gait/Transferring Normal/ bed rest/ wheelchair 0 Yes Weak (  short steps with or without shuffle, stooped but able to lift head while 0 No walking, may seek support from furniture) Impaired (short steps with shuffle, may have difficulty arising from chair, head 0 No down, impaired balance) Mental Status Oriented to own ability 0 Yes Electronic Signature(s) Signed: 04/28/2019 4:44:26 PM By: Montey Hora Entered By: Montey Hora on 04/28/2019 13:33:37 Connor Ross ([id number]) -------------------------------------------------------------------------------- Foot Assessment Details Patient Name: Connor Ross Date of Service: 04/28/2019 1:15 PM Medical Record Number: [id number] Patient Account Number: [id number] Date of Birth/Sex: 03/01/1966 (53 y.o. M) Treating RN: Montey Hora Primary Care Zoye Chandra: Deborra Medina Other Clinician: Referring Kelechi Astarita: Deborra Medina Treating Valentino Saavedra/Extender: Tito Dine in Treatment: 0 Foot Assessment Items Site Locations + = Sensation present, - = Sensation absent, C = Callus, U = Ulcer R = Redness, W = Warmth, M = Maceration, PU = Pre-ulcerative lesion F = Fissure, S = Swelling, D = Dryness Assessment Right:  Left: Other Deformity: No No Prior Foot Ulcer: No No Prior Amputation: No No Charcot Joint: No No Ambulatory Status: Ambulatory Without Help Gait: Steady Electronic Signature(s) Signed: 04/28/2019 4:44:26 PM By: Montey Hora Entered By: Montey Hora on 04/28/2019 13:49:06 Connor Ross ([id number]) -------------------------------------------------------------------------------- Nutrition Risk Screening Details Patient Name: Connor Ross Date of Service: 04/28/2019 1:15 PM Medical Record Number: [id number] Patient Account Number: [id number] Date of Birth/Sex: 10-24-1965 (53 y.o. M) Treating RN: Montey Hora Primary Care Latina Frank: Deborra Medina Other Clinician: Referring Lashante Fryberger: Deborra Medina Treating Jonny Dearden/Extender: Tito Dine in Treatment: 0 Height (in): 71 Weight (lbs): 315 Body Mass Index (BMI): 43.9 Nutrition Risk Screening Items Score Screening NUTRITION RISK SCREEN: I have an illness or condition that made me change the kind and/or amount of 0 No food I eat I eat fewer than two meals per day 0 No I eat few fruits and vegetables, or milk products 0 No I have three or more drinks of beer, liquor or wine almost every day 0 No I have tooth or mouth problems that make it hard for me to eat 0 No I don't always have enough money to buy the food I need 0 No I eat alone most of the time 0 No I take three or more different prescribed or over-the-counter drugs a day 1 Yes Without wanting to, I have lost or gained 10 pounds in the last six months 0 No I am not always physically able to shop, cook and/or feed myself 0 No Nutrition Protocols Good Risk Protocol 0 No interventions needed Moderate Risk Protocol High Risk Proctocol Risk Level: Good Risk Score: 1 Electronic Signature(s) Signed: 04/28/2019 4:44:26 PM By: Montey Hora Entered By: Montey Hora on 04/28/2019 13:33:45

## 2019-04-29 NOTE — Progress Notes (Addendum)
Connor Ross, Connor Ross ([id number]) Visit Report for 04/28/2019 Chief Complaint Document Details Patient Name: Connor Ross Date of Service: 04/28/2019 1:15 PM Medical Record Number: [id number] Patient Account Number: [id number] Date of Birth/Sex: Sep 22, 1965 (53 y.o. M) Treating RN: Cornell Barman Primary Care Provider: Deborra Medina Other Clinician: Referring Provider: Deborra Medina Treating Provider/Extender: Tito Dine in Treatment: 0 Information Obtained from: Patient Chief Complaint 04/28/2019; patient is here for review of wound on the tip of his left great toe that he says is been present for 1 year Electronic Signature(s) Signed: 04/28/2019 5:10:21 PM By: Linton Ham MD Entered By: Linton Ham on 04/28/2019 14:25:02 Connor Ross ([id number]) -------------------------------------------------------------------------------- Debridement Details Patient Name: Connor Ross Date of Service: 04/28/2019 1:15 PM Medical Record Number: [id number] Patient Account Number: [id number] Date of Birth/Sex: 1966-07-01 (53 y.o. M) Treating RN: Cornell Barman Primary Care Provider: Deborra Medina Other Clinician: Referring Provider: Deborra Medina Treating Provider/Extender: Tito Dine in Treatment: 0 Debridement Performed for Wound #1 Left Toe Great Assessment: Performed By: Physician Ricard Dillon, MD Debridement Type: Debridement Severity of Tissue Pre Fat layer exposed Debridement: Level of Consciousness (Pre- Awake and Alert procedure): Pre-procedure Verification/Time Yes - 14:11 Out Taken: Start Time: 14:11 Pain Control: Lidocaine Total Area Debrided (L x W): 0.3 (cm) x 0.3 (cm) = 0.09 (cm) Tissue and other material Viable, Non-Viable, Slough, Subcutaneous, Skin: Dermis , Slough debrided: Level: Skin/Subcutaneous Tissue Debridement Description: Excisional Instrument: Curette Bleeding: Minimum Hemostasis Achieved:  Pressure End Time: 14:15 Response to Treatment: Procedure was tolerated well Level of Consciousness Awake and Alert (Post-procedure): Post Debridement Measurements of Total Wound Length: (cm) 0.5 Width: (cm) 0.5 Depth: (cm) 0.4 Volume: (cm) 0.079 Character of Wound/Ulcer Post Debridement: Stable Severity of Tissue Post Debridement: Fat layer exposed Post Procedure Diagnosis Same as Pre-procedure Electronic Signature(s) Signed: 04/28/2019 5:10:21 PM By: Linton Ham MD Signed: 04/28/2019 5:44:53 PM By: Gretta Cool, BSN, RN, CWS, Kim RN, BSN Entered By: Linton Ham on 04/28/2019 14:24:39 Connor Ross ([id number]) -------------------------------------------------------------------------------- HPI Details Patient Name: Connor Ross Date of Service: 04/28/2019 1:15 PM Medical Record Number: [id number] Patient Account Number: [id number] Date of Birth/Sex: 01-Dec-1965 (54 y.o. 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 He comes in for our review of a wound on the tip of his left great toe that he says is been there for about a year. He saw his primary physician on 04/23/2019 who I believe referred him here. Noting that he had had this for "several weeks". He has not had any x-rays of his foot. He has been cleaning this with peroxide applying essential oils and bacitracin. He relates the start of this to placing topical salicylic acid on some callus he had in this area about a year ago. He was using this for a plantar wart on the same foot he says this opened the entire area. Past medical history; type 2 diabetes with polyneuropathy, history of herniated disks. He is on Lasix and  I believe on miCardis for albuminuria ABIs in our clinic were 1.09 on the right and 1.14 on the left Electronic Signature(s) Signed: 05/03/2019 12:52:04 PM By: Gretta Cool, BSN, RN, CWS, Kim RN, BSN Signed: 05/04/2019 2:52:20 PM By: Linton Ham MD Previous Signature: 04/28/2019 5:10:21 PM Version By:  Linton Ham MD Entered By: Gretta Cool, BSN, RN, CWS, Kim on 05/03/2019 12:52:04 Connor Ross ([id number]) -------------------------------------------------------------------------------- Physical Exam Details Patient Name: Connor Ross Date of Service: 04/28/2019 1:15 PM Medical Record Number: [id number] Patient Account Number: [id number] Date of Birth/Sex: September 03, 1965 (53 y.o. M) Treating RN: Cornell Barman Primary Care Provider: Deborra Medina Other Clinician: Referring Provider: Deborra Medina Treating Provider/Extender: Tito Dine in Treatment: 0 Constitutional Sitting or standing Blood Pressure is within target range for patient.. Pulse regular and within target range for patient.[name] Respirations regular, non-labored and within target range.. Temperature is normal and within the target range for the patient.[name] appears in no distress. Eyes Conjunctivae clear. No discharge. Respiratory Respiratory effort is easy and symmetric bilaterally. Rate is normal at rest and on room air.. Cardiovascular Pedal pulses palpable and strong bilaterally.. Integumentary (Hair, Skin) Patient has multiple hammertoes but this does not involve the first toe. He has a raised callus area on roughly the third met head whether this is a plantar wart or not I am uncertain. Neurological Patient has reduced sensation to microfilament in the plantar foot. Psychiatric No evidence of depression, anxiety, or agitation. Calm, cooperative, and communicative. Appropriate interactions and affect.. Notes Wound exam; areas on the tip of the left first toe. About 0.3 cm in depth and circumferential  undermining. Using a #5 curette I removed skin and subcutaneous tissue from the wound surface. Hemostasis with direct pressure. For small wound this has some depth but there is no exposed bone Electronic Signature(s) Signed: 04/28/2019 5:10:21 PM By: Linton Ham MD Entered By: Linton Ham on 04/28/2019 14:45:42 Connor Ross ([id number]) -------------------------------------------------------------------------------- Physician Orders Details Patient Name: Connor Ross Date of Service: 04/28/2019 1:15 PM Medical Record Number: [id number] Patient Account Number: [id number] Date of Birth/Sex: 02-Sep-1965 (53 y.o. M) Treating RN: Cornell Barman Primary Care Provider: Deborra Medina Other Clinician: Referring Provider: Deborra Medina Treating Provider/Extender: Tito Dine in Treatment: 0 Verbal / Phone Orders: No Diagnosis Coding Wound Cleansing Wound #1 Left Toe Great o May Shower, gently pat wound dry prior to applying new dressing. - do not use scrub brush Anesthetic (add to Medication List) Wound #1 Left Toe Great o Topical Lidocaine 4% cream applied to wound bed prior to debridement (In Clinic Only). Primary Wound Dressing o Silver Collagen - moisten with saline Secondary Dressing o Foam - conform Dressing Change Frequency o Change dressing every other day. Follow-up Appointments o Return Appointment in 1 week. Edema Control o Elevate legs to the level of the heart and pump ankles as often as possible Off-Loading o Open toe surgical shoe with peg assist. - Left Radiology o X-ray, foot - 3-view with concentration on great toe Electronic Signature(s) Signed: 04/28/2019 5:10:21 PM By: Linton Ham MD Signed: 04/28/2019 5:44:53 PM By: Gretta Cool, BSN, RN, CWS, Kim RN, BSN Entered By: Gretta Cool, BSN, RN, CWS, Kim on 04/28/2019 14:21:21 Connor Ross  ([id number]) -------------------------------------------------------------------------------- Problem List Details Patient Name: Connor Ross Date of Service: 04/28/2019 1:15 PM Medical Record Number: [id number] Patient Account Number: [id number] Date of Birth/Sex: 12-22-1965 (52 y.o. M) Treating RN: Cornell Barman Primary Care Provider: Deborra Medina Other Clinician: Referring Provider: Deborra Medina Treating Provider/Extender: Tito Dine in Treatment: 0 Active Problems ICD-10 Evaluated Encounter Code Description Active Date Today Diagnosis E11.621 Type 2 diabetes mellitus with foot ulcer 04/28/2019 No Yes E11.42 Type 2 diabetes mellitus with diabetic polyneuropathy 04/28/2019 No Yes L97.522 Non-pressure chronic ulcer of other part of left foot with fat  04/28/2019 No Yes layer exposed Inactive Problems Resolved Problems Electronic Signature(s) Signed: 04/28/2019 5:10:21 PM By: Linton Ham MD Entered By: Linton Ham on 04/28/2019 14:50:01 Connor Ross ([id number]) -------------------------------------------------------------------------------- Progress Note Details Patient Name: Connor Ross Date of Service: 04/28/2019 1:15 PM Medical Record Number: [id number] Patient Account Number: [id number] Date of Birth/Sex: July 26, 1965 (53 y.o. 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 He comes in for our review of a wound on the tip of his left great toe that he says is been there for  about a year. He saw his primary physician on 04/23/2019 who I believe referred him here. Noting that he had had this for "several weeks". He has not had any x-rays of his foot. He has been cleaning this with peroxide applying essential oils and bacitracin. He relates the start of this to placing topical salicylic acid on some callus he had in this area about a year ago. He was using this for a plantar wart on the same foot he says this opened the entire area. Past medical history; type 2 diabetes with polyneuropathy, history of herniated disks. He is on Lasix and I believe on miCardis for albuminuria ABIs in our clinic were 1.09 on the right and 1.14 on the left Patient History Information obtained from Patient. Allergies Statins-Hmg-Coa Reductase Inhibitors (Reaction: joint pain), codeine (Reaction: nausea) Family History Cancer - Father,Siblings, Diabetes - Paternal Grandparents, Heart Disease - Mother, Hypertension - Mother, Lung Disease - Mother, No family history of Hereditary Spherocytosis, Kidney Disease, Seizures, Stroke, Thyroid Problems, Tuberculosis. Social History Never smoker, Marital Status - Married, Alcohol Use - Rarely, Drug Use - No History, Caffeine Use - Daily. Medical History Cardiovascular Denies history of Angina, Arrhythmia, Congestive Heart Failure, Coronary Artery Disease, Deep Vein Thrombosis, Hypertension, Hypotension, Myocardial Infarction, Peripheral Arterial Disease, Peripheral Venous Disease, Phlebitis, Vasculitis Endocrine Patient has history of Type II Diabetes Denies history of Type I Diabetes Integumentary (Skin) Denies history of History of Burn, History of pressure wounds Romito, Isa Rankin ([id number]) Musculoskeletal Patient has history of Osteoarthritis Denies history of Gout, Rheumatoid Arthritis, Osteomyelitis Neurologic Patient has history of Neuropathy Denies history of Dementia, Quadriplegia, Paraplegia, Seizure Disorder Patient is  treated with Insulin, Oral Agents. Blood sugar is tested. Medical And Surgical History Notes Musculoskeletal herniated discs Review of Systems (ROS) Constitutional Symptoms (General Health) Denies complaints or symptoms of Fatigue, Fever, Chills, Marked Weight Change. Eyes Denies complaints or symptoms of Dry Eyes, Vision Changes, Glasses / Contacts. Ear/Nose/Mouth/Throat Denies complaints or symptoms of Difficult clearing ears, Sinusitis. Hematologic/Lymphatic Denies complaints or symptoms of Bleeding / Clotting Disorders, Human Immunodeficiency Virus. Respiratory Denies complaints or symptoms of Chronic or frequent coughs, Shortness of Breath. Cardiovascular Complains or has symptoms of LE edema. Denies complaints or symptoms of Chest pain. Gastrointestinal Denies complaints or symptoms of Frequent diarrhea, Nausea, Vomiting. Endocrine Denies complaints or symptoms of Hepatitis, Thyroid disease, Polydypsia (Excessive Thirst). Genitourinary Denies complaints or symptoms of Kidney failure/ Dialysis, Incontinence/dribbling. Immunological Denies complaints or symptoms of Hives, Itching. Integumentary (Skin) Complains or has symptoms of Wounds. Denies complaints or symptoms  of Bleeding or bruising tendency, Breakdown, Swelling. Musculoskeletal Denies complaints or symptoms of Muscle Pain, Muscle Weakness. Neurologic Denies complaints or symptoms of Numbness/parasthesias, Focal/Weakness. Psychiatric Denies complaints or symptoms of Anxiety, Claustrophobia. Objective Constitutional Sitting or standing Blood Pressure is within target range for patient.. Pulse regular and within target range for patient.[name] Respirations regular, non-labored and within target range.. Temperature is normal and within the target range for the patient.[name] appears in no distress. Dirr, [place] ([id number]) Vitals Time Taken: 1:25 PM, Height: 71 in, Source: Stated, Weight: 315 lbs, Source: Measured,  BMI: 43.9, Temperature: 98.9 F, Pulse: 69 bpm, Respiratory Rate: 16 breaths/min, Blood Pressure: 112/64 mmHg. Eyes Conjunctivae clear. No discharge. Respiratory Respiratory effort is easy and symmetric bilaterally. Rate is normal at rest and on room air.. Cardiovascular Pedal pulses palpable and strong bilaterally.. Neurological Patient has reduced sensation to microfilament in the plantar foot. Psychiatric No evidence of depression, anxiety, or agitation. Calm, cooperative, and communicative. Appropriate interactions and affect.. General Notes: Wound exam; areas on the tip of the left first toe. About 0.3 cm in depth and circumferential undermining. Using a #5 curette I removed skin and subcutaneous tissue from the wound surface. Hemostasis with direct pressure. For small wound this has some depth but there is no exposed bone Integumentary (Hair, Skin) Patient has multiple hammertoes but this does not involve the first toe. He has a raised callus area on roughly the third met head whether this is a plantar wart or not I am uncertain. Wound #1 status is Open. Original cause of wound was Gradually Appeared. The wound is located on the Left Toe Great. The wound measures 0.5cm length x 0.5cm width x 0.5cm depth; 0.196cm^2 area and 0.098cm^3 volume. There is Fat Layer (Subcutaneous Tissue) Exposed exposed. There is no tunneling noted, however, there is undermining starting at 12:00 and ending at 12:00 with a maximum distance of 0.3cm. There is a medium amount of serous drainage noted. The wound margin is flat and intact. There is large (67-100%) red granulation within the wound bed. There is a small (1-33%) amount of necrotic tissue within the wound bed including Adherent Slough. Assessment Active Problems ICD-10 Type 2 diabetes mellitus with foot ulcer Type 2 diabetes mellitus with diabetic polyneuropathy Non-pressure chronic ulcer of other part of left foot with fat layer  exposed Procedures Wound #1 Pre-procedure diagnosis of Wound #1 is a Diabetic Wound/Ulcer of the Lower Extremity located on the Left Toe Great .Severity of Tissue Pre Debridement is: Fat layer exposed. There was a Excisional Skin/Subcutaneous Tissue ORHAN, MAYORGA. ([id number]) Debridement with a total area of 0.09 sq cm performed by Ricard Dillon, MD. With the following instrument(s): Curette to remove Viable and Non-Viable tissue/material. Material removed includes Subcutaneous Tissue, Slough, and Skin: Dermis after achieving pain control using Lidocaine. No specimens were taken. A time out was conducted at 14:11, prior to the start of the procedure. A Minimum amount of bleeding was controlled with Pressure. The procedure was tolerated well. Post Debridement Measurements: 0.5cm length x 0.5cm width x 0.4cm depth; 0.079cm^3 volume. Character of Wound/Ulcer Post Debridement is stable. Severity of Tissue Post Debridement is: Fat layer exposed. Post procedure Diagnosis Wound #1: Same as Pre-Procedure Plan Wound Cleansing: Wound #1 Left Toe Great: May Shower, gently pat wound dry prior to applying new dressing. - do not use scrub brush Anesthetic (add to Medication List): Wound #1 Left Toe Great: Topical Lidocaine 4% cream applied to wound bed prior to debridement (In Clinic Only). Primary  Wound Dressing: Silver Collagen - moisten with saline Secondary Dressing: Foam - conform Dressing Change Frequency: Change dressing every other day. Follow-up Appointments: Return Appointment in 1 week. Edema Control: Elevate legs to the level of the heart and pump ankles as often as possible Off-Loading: Open toe surgical shoe with peg assist. - Left Radiology ordered were: X-ray, foot - 3-view with concentration on great toe 1. Probably a neuropathic wound on the tip of the left first toe. There was callus and thick skin around the wound suggesting that this is a pressure phenomenon 2.  Silver collagen hydrogel change daily. 3. Surgical shoe to offload the toe. This was explained to the patient in some detail. 4. Because of the prolonged timeframe the patient actually says this wound was present I have gone ahead and re-x-rayed the foot with concentration on the toe. He is able to show me pictures on his smart phone however this has gotten smaller 5. I did not think there was any evidence of infection no cultures were done Electronic Signature(s) Signed: 05/03/2019 12:52:25 PM By: Gretta Cool, BSN, RN, CWS, Kim RN, BSN Signed: 05/04/2019 2:52:20 PM By: Linton Ham MD Previous Signature: 04/28/2019 2:50:38 PM Version By: Linton Ham MD Entered By: Gretta Cool, BSN, RN, CWS, Kim on 05/03/2019 12:52:25 Connor Ross ([id number]) -------------------------------------------------------------------------------- ROS/PFSH Details Patient Name: Connor Ross Date of Service: 04/28/2019 1:15 PM Medical Record Number: [id number] Patient Account Number: [id number] Date of Birth/Sex: 04-19-1966 (53 y.o. M) Treating RN: Montey Hora Primary Care Provider: Deborra Medina Other Clinician: Referring Provider: Deborra Medina Treating Provider/Extender: Tito Dine in Treatment: 0 Information Obtained From Patient Constitutional Symptoms (General Health) Complaints and Symptoms: Negative for: Fatigue; Fever; Chills; Marked Weight Change Eyes Complaints and Symptoms: Negative for: Dry Eyes; Vision Changes; Glasses / Contacts Ear/Nose/Mouth/Throat Complaints and Symptoms: Negative for: Difficult clearing ears; Sinusitis Hematologic/Lymphatic Complaints and Symptoms: Negative for: Bleeding / Clotting Disorders; Human Immunodeficiency Virus Respiratory Complaints and Symptoms: Negative for: Chronic or frequent coughs; Shortness of Breath Cardiovascular Complaints and Symptoms: Positive for: LE edema Negative for: Chest pain Medical History: Negative  for: Angina; Arrhythmia; Congestive Heart Failure; Coronary Artery Disease; Deep Vein Thrombosis; Hypertension; Hypotension; Myocardial Infarction; Peripheral Arterial Disease; Peripheral Venous Disease; Phlebitis; Vasculitis Gastrointestinal Complaints and Symptoms: Negative for: Frequent diarrhea; Nausea; Vomiting Endocrine Complaints and Symptoms: Negative for: Hepatitis; Thyroid disease; Polydypsia (Excessive Thirst) Medical History: Positive for: Type II Diabetes TOMAS, SCHAMP. ([id number]) Negative for: Type I Diabetes Time with diabetes: 14+ years Treated with: Insulin, Oral agents Blood sugar tested every day: Yes Tested : BID Genitourinary Complaints and Symptoms: Negative for: Kidney failure/ Dialysis; Incontinence/dribbling Immunological Complaints and Symptoms: Negative for: Hives; Itching Integumentary (Skin) Complaints and Symptoms: Positive for: Wounds Negative for: Bleeding or bruising tendency; Breakdown; Swelling Medical History: Negative for: History of Burn; History of pressure wounds Musculoskeletal Complaints and Symptoms: Negative for: Muscle Pain; Muscle Weakness Medical History: Positive for: Osteoarthritis Negative for: Gout; Rheumatoid Arthritis; Osteomyelitis Past Medical History Notes: herniated discs Neurologic Complaints and Symptoms: Negative for: Numbness/parasthesias; Focal/Weakness Medical History: Positive for: Neuropathy Negative for: Dementia; Quadriplegia; Paraplegia; Seizure Disorder Psychiatric Complaints and Symptoms: Negative for: Anxiety; Claustrophobia Oncologic Immunizations Pneumococcal Vaccine: Received Pneumococcal Vaccination: No Immunization Notes: up to date Implantable Devices SENAY, SISTRUNK ([id number]) None Family and Social History Cancer: Yes - Father,Siblings; Diabetes: Yes - Paternal Grandparents; Heart Disease: Yes - Mother; Hereditary Spherocytosis: No; Hypertension: Yes - Mother;  Kidney Disease: No; Lung Disease: Yes - Mother; Seizures: No; Stroke: No; Thyroid  Problems: No; Tuberculosis: No; Never smoker; Marital Status - Married; Alcohol Use: Rarely; Drug Use: No History; Caffeine Use: Daily; Financial Concerns: No; Food, Clothing or Shelter Needs: No; Support System Lacking: No; Transportation Concerns: No Electronic Signature(s) Signed: 04/28/2019 4:44:26 PM By: Montey Hora Signed: 04/28/2019 5:10:21 PM By: Linton Ham MD Entered By: Montey Hora on 04/28/2019 13:38:57 Connor Ross ([id number]) -------------------------------------------------------------------------------- [place] Details Patient Name: Connor Ross Date of Service: 04/28/2019 Medical Record Number: [id number] Patient Account Number: [id number] Date of Birth/Sex: September 25, 1965 (53 y.o. M) Treating RN: Cornell Barman Primary Care Provider: Deborra Medina Other Clinician: Referring Provider: Deborra Medina Treating Provider/Extender: Tito Dine in Treatment: 0 Diagnosis Coding ICD-10 Codes Code Description [phone] Type 2 diabetes mellitus with foot ulcer E11.42 Type 2 diabetes mellitus with diabetic polyneuropathy L97.522 Non-pressure chronic ulcer of other part of left foot with fat layer exposed Facility Procedures CPT4 Code: [id number] Description: [place] VISIT-LEV 3 EST PT Modifier: Quantity: 1 CPT4 Code: [id number] Description: [place] - DEB SUBQ TISSUE 20 SQ CM/< ICD-10 Diagnosis Description L97.522 Non-pressure chronic ulcer of other part of left foot with fat Modifier: layer exposed Quantity: 1 Physician Procedures CPT4 Code: [id number] Description: 51700 - WC PHYS LEVEL 2 - NEW PT ICD-10 Diagnosis Description L97.522 Non-pressure chronic ulcer of other part of left foot with fat E11.621 Type 2 diabetes mellitus with foot ulcer E11.42 Type 2 diabetes mellitus with diabetic  polyneuropathy Modifier: 25 layer exposed Quantity: 1 CPT4 Code:  [id number] Description: 11042 - WC PHYS SUBQ TISS 20 SQ CM ICD-10 Diagnosis Description L97.522 Non-pressure chronic ulcer of other part of left foot with fat Modifier: layer exposed Quantity: 1 Electronic Signature(s) Signed: 04/28/2019 5:10:21 PM By: Linton Ham MD Entered By: Linton Ham on 04/28/2019 14:51:05

## 2019-04-29 NOTE — Progress Notes (Addendum)
Connor Ross ([id number]) Visit Report for 04/28/2019 Allergy List Details Patient Name: Connor Ross Date of Service: 04/28/2019 1:15 PM Medical Record Number: [id number] Patient Account Number: [id number] Date of Birth/Sex: 1966/04/21 (53 y.o. M) Treating Ross: Montey Ross Primary Care Connor Ross: Connor Ross Other Clinician: Referring Connor Ross: Connor Ross Treating Connor Ross/Extender: Connor Ross in Treatment: 0 Allergies Active Allergies Statins-Hmg-Coa Reductase Inhibitors Reaction: joint pain codeine Reaction: nausea Allergy Notes Electronic Signature(s) Signed: 04/28/2019 4:44:26 PM By: Montey Ross Entered By: Montey Ross on 04/28/2019 13:31:36 Connor Ross ([id number]) -------------------------------------------------------------------------------- Arrival Information Details Patient Name: Connor Ross Date of Service: 04/28/2019 1:15 PM Medical Record Number: [id number] Patient Account Number: [id number] Date of Birth/Sex: 01-Jul-1966 (53 y.o. M) Treating Ross: Connor Ross Primary Care Arley Garant: Connor Ross Other Clinician: Referring Connor Ross: Connor Ross Treating Connor Ross/Extender: Connor Ross in Treatment: 0 Visit Information Patient Arrived: Ambulatory Arrival Time: 13:24 Accompanied By: self Transfer Assistance: None Patient Identification Verified: Yes Secondary Verification Process Completed: Yes Patient Has Alerts: Yes Patient Alerts: DMII aspirin 81 Electronic Signature(s) Signed: 04/28/2019 4:44:26 PM By: Montey Ross Entered By: Montey Ross on 04/28/2019 13:31:51 Connor Ross ([id number]) -------------------------------------------------------------------------------- Clinic Level of Care Assessment Details Patient Name: Connor Ross Date of Service: 04/28/2019 1:15 PM Medical Record Number: [id number] Patient Account Number: [id number] Date of  Birth/Sex: [birth year] (53 y.o. M) Treating Ross: Connor Ross Primary Care Tanaia Hawkey: Connor Ross Other Clinician: Referring Connor Ross: Connor Ross Treating Connor Ross/Extender: Connor Ross in Treatment: 0 Clinic Level of Care Assessment Items TOOL 1 Quantity Score []  - Use when EandM and Procedure is performed on INITIAL visit 0 ASSESSMENTS - Nursing Assessment / Reassessment X - General Physical Exam (combine w/ comprehensive assessment (listed just below) when 1 20 performed on new pt. evals) X- 1 25 Comprehensive Assessment (HX, ROS, Risk Assessments, Wounds Hx, etc.) ASSESSMENTS - Wound and Skin Assessment / Reassessment []  - Dermatologic / Skin Assessment (not related to wound area) 0 ASSESSMENTS - Ostomy and/or Continence Assessment and Care []  - Incontinence Assessment and Management 0 []  - 0 Ostomy Care Assessment and Management (repouching, etc.) PROCESS - Coordination of Care X - Simple Patient / Family Education for ongoing care 1 15 []  - 0 Complex (extensive) Patient / Family Education for ongoing care []  - 0 Staff obtains Programmer, systems, Records, Test Results / Process Orders []  - 0 Staff telephones HHA, Nursing Homes / Clarify orders / etc []  - 0 Routine Transfer to another Facility (non-emergent condition) []  - 0 Routine Hospital Admission (non-emergent condition) X- 1 15 New Admissions / Biomedical engineer / Ordering NPWT, Apligraf, etc. []  - 0 Emergency Hospital Admission (emergent condition) PROCESS - Special Needs []  - Pediatric / Minor Patient Management 0 []  - 0 Isolation Patient Management []  - 0 Hearing / Language / Visual special needs []  - 0 Assessment of Community assistance (transportation, D/C planning, etc.) []  - 0 Additional assistance / Altered mentation []  - 0 Support Surface(s) Assessment (bed, cushion, seat, etc.) Ross, Connor Ross ([id number]) INTERVENTIONS - Miscellaneous []  - External ear exam 0 []  - 0 Patient  Transfer (multiple staff / Civil Service fast streamer / Similar devices) []  - 0 Simple Staple / Suture removal (25 or less) []  - 0 Complex Staple / Suture removal (26 or more) []  - 0 Hypo/Hyperglycemic Management (do not check if billed separately) X- 1 15 Ankle / Brachial Index (ABI) - do not check if billed separately Has the patient been seen at the hospital within the  last three years: Yes Total Score: 90 Level Of Care: New/Established - Level 3 Electronic Signature(s) Signed: 04/28/2019 5:44:53 PM By: Connor Ross, BSN, Ross, CWS, Connor Ross, BSN Entered By: Connor Ross, BSN, Ross, CWS, Connor on 04/28/2019 14:21:43 Connor Ross ([id number]) -------------------------------------------------------------------------------- Encounter Discharge Information Details Patient Name: Connor Ross Date of Service: 04/28/2019 1:15 PM Medical Record Number: [id number] Patient Account Number: [id number] Date of Birth/Sex: [birth year] (53 y.o. M) Treating Ross: Connor Ross Primary Care Connor Ross: Connor Ross Other Clinician: Referring Connor Ross: Connor Ross Treating Connor Ross/Extender: Connor Ross in Treatment: 0 Encounter Discharge Information Items Post Procedure Vitals Discharge Condition: Stable Temperature (F): 98.9 Ambulatory Status: Ambulatory Pulse (bpm): 69 Discharge Destination: Home Respiratory Rate (breaths/min): 16 Transportation: Private Auto Blood Pressure (mmHg): 112/64 Accompanied By: self Schedule Follow-up Appointment: Yes Clinical Summary of Care: Electronic Signature(s) Signed: 04/29/2019 1:55:09 PM By: Connor Ross Entered By: Connor Ross on 04/29/2019 13:55:08 Connor Ross ([id number]) -------------------------------------------------------------------------------- Lower Extremity Assessment Details Patient Name: Connor Ross Date of Service: 04/28/2019 1:15 PM Medical Record Number: [id number] Patient Account Number: [id number] Date  of Birth/Sex: 06-28-1966 (53 y.o. M) Treating Ross: Montey Ross Primary Care Adelaida Reindel: Connor Ross Other Clinician: Referring Daneli Butkiewicz: Connor Ross Treating Hank Walling/Extender: Connor Ross in Treatment: 0 Edema Assessment Assessed: [Left: No] [Right: No] Edema: [Left: Yes] [Right: Yes] Calf Left: Right: Point of Measurement: 36 cm From Medial Instep 50 cm 48 cm Ankle Left: Right: Point of Measurement: 12 cm From Medial Instep 30.2 cm 28.5 cm Vascular Assessment Pulses: Dorsalis Pedis Palpable: [Left:Yes] [Right:Yes] Doppler Audible: [Left:Yes] [Right:Yes] Posterior Tibial Palpable: [Left:Yes] [Right:Yes] Doppler Audible: [Left:Yes] [Right:Yes] Blood Pressure: Brachial: [Left:112] [Right:112] Dorsalis Pedis: 112 [Left:Dorsalis Pedis: 116] Ankle: Posterior Tibial: 122 [Left:Posterior Tibial: 128 1.09] [Right:1.14] Electronic Signature(s) Signed: 04/28/2019 4:44:26 PM By: Montey Ross Entered By: Montey Ross on 04/28/2019 13:55:15 Connor Ross ([id number]) -------------------------------------------------------------------------------- Multi Wound Chart Details Patient Name: Connor Ross Date of Service: 04/28/2019 1:15 PM Medical Record Number: [id number] Patient Account Number: [id number] Date of Birth/Sex: 1966/04/07 (53 y.o. M) Treating Ross: Connor Ross Primary Care Daschel Roughton: Connor Ross Other Clinician: Referring Reda Citron: Connor Ross Treating Colin Ellers/Extender: Connor Ross in Treatment: 0 Vital Signs Height(in): 71 Pulse(bpm): 69 Weight(lbs): 315 Blood Pressure(mmHg): 112/64 Body Mass Index(BMI): 44 Temperature(F): 98.9 Respiratory Rate 16 (breaths/min): Photos: [N/A:N/A] Wound Location: Left Toe Great N/A N/A Wounding Event: Gradually Appeared N/A N/A Primary Etiology: Diabetic Wound/Ulcer of the N/A N/A Lower Extremity Comorbid History: Type II Diabetes, N/A N/A Osteoarthritis, Neuropathy Date  Acquired: 05/15/2018 N/A N/A Ross of Treatment: 0 N/A N/A Wound Status: Open N/A N/A Measurements L x W x D 0.5x0.5x0.5 N/A N/A (cm) Area (cm) : 0.196 N/A N/A Volume (cm) : 0.098 N/A N/A % Reduction in Area: 0.00% N/A N/A % Reduction in Volume: 0.00% N/A N/A Starting Position 1 12 (o'clock): Ending Position 1 12 (o'clock): Maximum Distance 1 (cm): 0.3 Undermining: Yes N/A N/A Classification: Grade 2 N/A N/A Exudate Amount: Medium N/A N/A Exudate Type: Serous N/A N/A Exudate Color: amber N/A N/A Wound Margin: Flat and Intact N/A N/A Granulation Amount: Large (67-100%) N/A N/A Granulation Quality: Red N/A N/A Necrotic Amount: Small (1-33%) N/A N/A Connor Ross, Connor Ross ([id number]) Exposed Structures: Fat Layer (Subcutaneous N/A N/A Tissue) Exposed: Yes Fascia: No Tendon: No Muscle: No Joint: No Bone: No Epithelialization: None N/A N/A Debridement: Debridement - Excisional N/A N/A Pre-procedure 14:11 N/A N/A Verification/Time Out Taken: Pain Control: Lidocaine N/A N/A Tissue Debrided: Subcutaneous, Slough N/A N/A Level: Skin/Subcutaneous  Tissue N/A N/A Debridement Area (sq cm): 0.09 N/A N/A Instrument: Curette N/A N/A Bleeding: Minimum N/A N/A Hemostasis Achieved: Pressure N/A N/A Debridement Treatment Procedure was tolerated well N/A N/A Response: Post Debridement 0.5x0.5x0.4 N/A N/A Measurements L x W x D (cm) Post Debridement Volume: 0.079 N/A N/A (cm) Procedures Performed: Debridement N/A N/A Treatment Notes Electronic Signature(s) Signed: 04/28/2019 5:10:21 PM By: Linton Ham MD Entered By: Linton Ham on 04/28/2019 14:23:36 Connor Ross ([id number]) -------------------------------------------------------------------------------- [place] Details Patient Name: Connor Ross Date of Service: 04/28/2019 1:15 PM Medical Record Number: [id number] Patient Account Number: [id number] Date of Birth/Sex:  [birth year] (53 y.o. M) Treating Ross: Connor Ross Primary Care Beauden Tremont: Connor Ross Other Clinician: Referring Landrie Beale: Connor Ross Treating George Alcantar/Extender: Connor Ross in Treatment: 0 Active Inactive Medication Nursing Diagnoses: Knowledge deficit related to medication safety: actual or potential Goals: Patient/caregiver will demonstrate understanding of all current medications Date Initiated: 04/28/2019 Target Resolution Date: 05/12/2019 Goal Status: Active Interventions: Assess for medication contraindications each visit where new medications are prescribed Notes: Necrotic Tissue Nursing Diagnoses: Impaired tissue integrity related to necrotic/devitalized tissue Goals: Necrotic/devitalized tissue will be minimized in the wound bed Date Initiated: 04/28/2019 Target Resolution Date: 05/12/2019 Goal Status: Active Interventions: Assess patient pain level pre-, during and post procedure and prior to discharge Treatment Activities: Apply topical anesthetic as ordered : 04/28/2019 Notes: Orientation to the Wound Care Program Nursing Diagnoses: Knowledge deficit related to the wound healing center program Goals: Patient/caregiver will verbalize understanding of the [place] Date Initiated: 04/28/2019 Target Resolution Date: 05/12/2019 Goal Status: Active Connor Ross, Connor Ross ([id number]) Interventions: Provide education on orientation to the wound center Notes: Wound/Skin Impairment Nursing Diagnoses: Impaired tissue integrity Goals: Ulcer/skin breakdown will have a volume reduction of 30% by week 4 Date Initiated: 04/28/2019 Target Resolution Date: 05/29/2019 Goal Status: Active Interventions: Assess ulceration(s) every visit Treatment Activities: Referred to DME Maxime Beckner for dressing supplies : 04/28/2019 Skin care regimen initiated : 04/28/2019 Topical wound management initiated : 04/28/2019 Notes: Electronic  Signature(s) Signed: 04/28/2019 5:44:53 PM By: Connor Ross, BSN, Ross, CWS, Connor Ross, BSN Entered By: Connor Ross, BSN, Ross, CWS, Connor on 04/28/2019 14:17:10 Connor Ross ([id number]) -------------------------------------------------------------------------------- Pain Assessment Details Patient Name: Connor Ross Date of Service: 04/28/2019 1:15 PM Medical Record Number: [id number] Patient Account Number: [id number] Date of Birth/Sex: 03-14-1966 (53 y.o. M) Treating Ross: Connor Ross Primary Care Roderick Calo: Connor Ross Other Clinician: Referring Deny Chevez: Connor Ross Treating Katurah Karapetian/Extender: Connor Ross in Treatment: 0 Active Problems Location of Pain Severity and Description of Pain Patient Has Paino Yes Site Locations Rate the pain. Current Pain Level: 3 Pain Management and Medication Current Pain Management: Electronic Signature(s) Signed: 04/28/2019 1:48:26 PM By: Connor Ross, BSN, Ross, CWS, Connor Ross, BSN Signed: 04/28/2019 4:52:13 PM By: Lorine Bears RCP, RRT, CHT Entered By: Lorine Bears on 04/28/2019 13:25:08 Connor Ross ([id number]) -------------------------------------------------------------------------------- Patient/Caregiver Education Details Patient Name: Connor Ross Date of Service: 04/28/2019 1:15 PM Medical Record Number: [id number] Patient Account Number: [id number] Date of Birth/Gender: [birth year] (53 y.o. M) Treating Ross: Connor Ross Primary Care Physician: Connor Ross Other Clinician: Referring Physician: Deborra Ross Treating Physician/Extender: Connor Ross in Treatment: 0 Education Assessment Education Provided To: Patient Education Topics Provided Welcome To [facility] [place]: Handouts: Welcome To [facility] [place] Methods: Demonstration, Explain/Verbal Responses: State content correctly Wound/Skin Impairment: Handouts: Caring for Your Ulcer, Skin Care Do's and  Dont's, Other: dressing as prescribed Methods: Demonstration, Explain/Verbal  Responses: State content correctly Electronic Signature(s) Signed: 04/28/2019 5:44:53 PM By: Connor Ross, BSN, Ross, CWS, Connor Ross, BSN Entered By: Connor Ross, BSN, Ross, CWS, Connor on 04/28/2019 14:23:18 Connor Ross ([id number]) -------------------------------------------------------------------------------- Wound Assessment Details Patient Name: Connor Ross Date of Service: 04/28/2019 1:15 PM Medical Record Number: [id number] Patient Account Number: [id number] Date of Birth/Sex: Dec 01, 1965 (53 y.o. M) Treating Ross: Connor Ross Primary Care Jashae Wiggs: Connor Ross Other Clinician: Referring Tristy Udovich: Connor Ross Treating Pavel Gadd/Extender: Connor Ross in Treatment: 0 Wound Status Wound Number: 1 Primary Etiology: Diabetic Wound/Ulcer of the Lower Extremity Wound Location: Left Toe Great Wound Status: Open Wounding Event: Gradually Appeared Comorbid Type II Diabetes, Osteoarthritis, Date Acquired: 05/15/2018 History: Neuropathy Ross Of Treatment: 0 Clustered Wound: No Photos Wound Measurements Length: (cm) 0.5 % Reduction in Width: (cm) 0.5 % Reduction in Depth: (cm) 0.5 Epithelializati Area: (cm) 0.196 Tunneling: Volume: (cm) 0.098 Undermining: Starting Pos Ending Posit Maximum Dist Area: 0% Volume: 0% on: None No Yes ition (o'clock): 12 ion (o'clock): 12 ance: (cm) 0.3 Wound Description Classification: Grade 2 Foul Odor After Wound Margin: Flat and Intact Slough/Fibrino Exudate Amount: Medium Exudate Type: Serous Exudate Color: amber Cleansing: No Yes Wound Bed Granulation Amount: Large (67-100%) Exposed Structure Granulation Quality: Red Fascia Exposed: No Necrotic Amount: Small (1-33%) Fat Layer (Subcutaneous Tissue) Exposed: Yes Necrotic Quality: Adherent Slough Tendon Exposed: No Muscle Exposed: No Joint Exposed: No Connor Ross, Connor Ross  ([id number]) Bone Exposed: No Treatment Notes Wound #1 (Left Toe Great) Notes Silver collagen, foam, conform, tape to secure Electronic Signature(s) Signed: 04/28/2019 5:44:53 PM By: Connor Ross, BSN, Ross, CWS, Connor Ross, BSN Entered By: Connor Ross, BSN, Ross, CWS, Connor on 04/28/2019 14:16:55 Connor Ross ([id number]) -------------------------------------------------------------------------------- Vitals Details Patient Name: Connor Ross Date of Service: 04/28/2019 1:15 PM Medical Record Number: [id number] Patient Account Number: [id number] Date of Birth/Sex: Feb 06, 1966 (53 y.o. M) Treating Ross: Connor Ross Primary Care Taneshia Lorence: Connor Ross Other Clinician: Referring Perris Tripathi: Connor Ross Treating Minaal Struckman/Extender: Connor Ross in Treatment: 0 Vital Signs Time Taken: 13:25 Temperature (F): 98.9 Height (in): 71 Pulse (bpm): 69 Source: Stated Respiratory Rate (breaths/min): 16 Weight (lbs): 315 Blood Pressure (mmHg): 112/64 Source: Measured Reference Range: 80 - 120 mg / dl Body Mass Index (BMI): 43.9 Electronic Signature(s) Signed: 04/28/2019 4:52:13 PM By: Lorine Bears RCP, RRT, CHT Entered By: Becky Sax, Amado Nash on 04/28/2019 13:28:43

## 2019-05-01 ENCOUNTER — Other Ambulatory Visit: Payer: Self-pay | Admitting: Internal Medicine

## 2019-05-05 ENCOUNTER — Encounter: Payer: 59 | Admitting: Internal Medicine

## 2019-05-05 ENCOUNTER — Other Ambulatory Visit: Payer: Self-pay

## 2019-05-05 DIAGNOSIS — B070 Plantar wart: Secondary | ICD-10-CM | POA: Diagnosis not present

## 2019-05-05 DIAGNOSIS — L97522 Non-pressure chronic ulcer of other part of left foot with fat layer exposed: Secondary | ICD-10-CM | POA: Diagnosis not present

## 2019-05-05 DIAGNOSIS — E1140 Type 2 diabetes mellitus with diabetic neuropathy, unspecified: Secondary | ICD-10-CM | POA: Diagnosis not present

## 2019-05-05 DIAGNOSIS — I10 Essential (primary) hypertension: Secondary | ICD-10-CM | POA: Diagnosis not present

## 2019-05-05 DIAGNOSIS — E1151 Type 2 diabetes mellitus with diabetic peripheral angiopathy without gangrene: Secondary | ICD-10-CM | POA: Diagnosis not present

## 2019-05-05 DIAGNOSIS — E11621 Type 2 diabetes mellitus with foot ulcer: Secondary | ICD-10-CM | POA: Diagnosis not present

## 2019-05-06 NOTE — Progress Notes (Signed)
Connor Ross, Connor Ross ([id number]) Visit Report for 05/05/2019 Arrival Information Details Patient Name: Connor Ross, Connor Ross Date of Service: 05/05/2019 3:30 PM Medical Record Number: [id number] Patient Account Number: [id number] Date of Birth/Sex: 03-Mar-1966 (53 y.o. M) Treating RN: Army Melia Primary Care Toia Micale: Deborra Medina Other Clinician: Referring Kennadie Brenner: Deborra Medina Treating Jolette Lana/Extender: Tito Dine in Treatment: 1 Visit Information History Since Last Visit Added or deleted any medications: No Patient Arrived: Ambulatory Any new allergies or adverse reactions: No Arrival Time: 15:36 Had a fall or experienced change in No Accompanied By: self activities of daily living that may affect Transfer Assistance: None risk of falls: Patient Identification Verified: Yes Signs or symptoms of abuse/neglect since last visito No Patient Has Alerts: Yes Hospitalized since last visit: No Patient Alerts: DMII Has Dressing in Place as Prescribed: Yes aspirin 81 Pain Present Now: No Electronic Signature(s) Signed: 05/05/2019 4:16:59 PM By: Army Melia Entered By: Army Melia on 05/05/2019 15:36:51 Connor Ross ([id number]) -------------------------------------------------------------------------------- Encounter Discharge Information Details Patient Name: Connor Ross Date of Service: 05/05/2019 3:30 PM Medical Record Number: [id number] Patient Account Number: [id number] Date of Birth/Sex: May 31, 1966 (53 y.o. M) Treating RN: Cornell Barman Primary Care Gwyndolyn Guilford: Deborra Medina Other Clinician: Referring Toniann Dickerson: Deborra Medina Treating Adriauna Campton/Extender: Tito Dine in Treatment: 1 Encounter Discharge Information Items Post Procedure Vitals Discharge Condition: Stable Temperature (F): 98.2 Ambulatory Status: Ambulatory Pulse (bpm): 67 Discharge Destination: Home Respiratory Rate (breaths/min): 16 Transportation:  Private Auto Blood Pressure (mmHg): 141/78 Accompanied By: self Schedule Follow-up Appointment: Yes Clinical Summary of Care: Electronic Signature(s) Signed: 05/05/2019 4:31:46 PM By: Gretta Cool, BSN, RN, CWS, Kim RN, BSN Entered By: Gretta Cool, BSN, RN, CWS, Kim on 05/05/2019 16:06:22 Connor Ross ([id number]) -------------------------------------------------------------------------------- Lower Extremity Assessment Details Patient Name: Connor Ross Date of Service: 05/05/2019 3:30 PM Medical Record Number: [id number] Patient Account Number: [id number] Date of Birth/Sex: [birth year] (53 y.o. M) Treating RN: Army Melia Primary Care Ernesteen Mihalic: Deborra Medina Other Clinician: Referring Joseluis Alessio: Deborra Medina Treating Kasten Leveque/Extender: Ricard Dillon Weeks in Treatment: 1 Edema Assessment Assessed: [Left: No] [Right: No] Edema: [Left: N] [Right: o] Vascular Assessment Pulses: Dorsalis Pedis Palpable: [Left:Yes] Electronic Signature(s) Signed: 05/05/2019 4:16:59 PM By: Army Melia Entered By: Army Melia on 05/05/2019 15:42:26 Connor Ross ([id number]) -------------------------------------------------------------------------------- Multi Wound Chart Details Patient Name: Connor Ross Date of Service: 05/05/2019 3:30 PM Medical Record Number: [id number] Patient Account Number: [id number] Date of Birth/Sex: [birth year] (53 y.o. M) Treating RN: Cornell Barman Primary Care Dhriti Fales: Deborra Medina Other Clinician: Referring Jamelah Sitzer: Deborra Medina Treating Perkins Molina/Extender: Tito Dine in Treatment: 1 Vital Signs Height(in): 71 Pulse(bpm): 67 Weight(lbs): 315 Blood Pressure(mmHg): 141/78 Body Mass Index(BMI): 44 Temperature(F): 98.2 Respiratory Rate 16 (breaths/min): Photos: [N/A:N/A] Wound Location: Left Toe Great N/A N/A Wounding Event: Gradually Appeared N/A N/A Primary Etiology: Diabetic Wound/Ulcer of the N/A N/A Lower  Extremity Comorbid History: Type II Diabetes, N/A N/A Osteoarthritis, Neuropathy Date Acquired: 05/15/2018 N/A N/A Weeks of Treatment: 1 N/A N/A Wound Status: Open N/A N/A Measurements L x W x D 0.2x0.3x0.6 N/A N/A (cm) Area (cm) : 0.047 N/A N/A Volume (cm) : 0.028 N/A N/A % Reduction in Area: 76.00% N/A N/A % Reduction in Volume: 71.40% N/A N/A Classification: Grade 2 N/A N/A Exudate Amount: Medium N/A N/A Exudate Type: Serous N/A N/A Exudate Color: amber N/A N/A Wound Margin: Flat and Intact N/A N/A Granulation Amount: Medium (34-66%) N/A N/A Granulation Quality: Red N/A N/A Necrotic Amount: Medium (34-66%) N/A N/A Exposed Structures: Fat  Layer (Subcutaneous N/A N/A Tissue) Exposed: Yes Fascia: No Tendon: No Muscle: No Connor Ross, Connor Ross. ([id number]) Joint: No Bone: No Epithelialization: None N/A N/A Debridement: Debridement - Excisional N/A N/A Pre-procedure 14:01 N/A N/A Verification/Time Out Taken: Pain Control: Lidocaine N/A N/A Tissue Debrided: Callus, Subcutaneous, Slough N/A N/A Level: Skin/Subcutaneous Tissue N/A N/A Debridement Area (sq cm): 0.15 N/A N/A Instrument: Curette N/A N/A Bleeding: Moderate N/A N/A Hemostasis Achieved: Pressure N/A N/A Debridement Treatment Procedure was tolerated well N/A N/A Response: Post Debridement 0.3x0.5x0.6 N/A N/A Measurements L x W x D (cm) Post Debridement Volume: 0.071 N/A N/A (cm) Procedures Performed: Debridement N/A N/A Treatment Notes Wound #1 (Left Toe Great) Notes Silver alginate, foam, conform, tape to secure Electronic Signature(s) Signed: 05/05/2019 5:06:38 PM By: Linton Ham MD Previous Signature: 05/05/2019 4:31:46 PM Version By: Gretta Cool, BSN, RN, CWS, Kim RN, BSN Entered By: Linton Ham on 05/05/2019 16:49:27 Connor Ross ([id number]) -------------------------------------------------------------------------------- [place] Details Patient Name: Connor Ross Date of Service: 05/05/2019 3:30 PM Medical Record Number: [id number] Patient Account Number: [id number] Date of Birth/Sex: 1966/02/18 (54 y.o. M) Treating RN: Cornell Barman Primary Care Tarris Delbene: Deborra Medina Other Clinician: Referring Connor Ross: Deborra Medina Treating Karalina Tift/Extender: Tito Dine in Treatment: 1 Active Inactive Medication Nursing Diagnoses: Knowledge deficit related to medication safety: actual or potential Goals: Patient/caregiver will demonstrate understanding of all current medications Date Initiated: 04/28/2019 Target Resolution Date: 05/12/2019 Goal Status: Active Interventions: Assess for medication contraindications each visit where new medications are prescribed Notes: Necrotic Tissue Nursing Diagnoses: Impaired tissue integrity related to necrotic/devitalized tissue Goals: Necrotic/devitalized tissue will be minimized in the wound bed Date Initiated: 04/28/2019 Target Resolution Date: 05/12/2019 Goal Status: Active Interventions: Assess patient pain level pre-, during and post procedure and prior to discharge Treatment Activities: Apply topical anesthetic as ordered : 04/28/2019 Notes: Orientation to the Wound Care Program Nursing Diagnoses: Knowledge deficit related to the wound healing center program Goals: Patient/caregiver will verbalize understanding of the [place] Date Initiated: 04/28/2019 Target Resolution Date: 05/12/2019 Goal Status: Active Connor Ross, Connor Ross ([id number]) Interventions: Provide education on orientation to the wound center Notes: Wound/Skin Impairment Nursing Diagnoses: Impaired tissue integrity Goals: Ulcer/skin breakdown will have a volume reduction of 30% by week 4 Date Initiated: 04/28/2019 Target Resolution Date: 05/29/2019 Goal Status: Active Interventions: Assess ulceration(s) every visit Treatment Activities: Referred to DME Eyoel Throgmorton for dressing supplies  : 04/28/2019 Skin care regimen initiated : 04/28/2019 Topical wound management initiated : 04/28/2019 Notes: Electronic Signature(s) Signed: 05/05/2019 4:31:46 PM By: Gretta Cool, BSN, RN, CWS, Kim RN, BSN Entered By: Gretta Cool, BSN, RN, CWS, Kim on 05/05/2019 16:01:14 Connor Ross ([id number]) -------------------------------------------------------------------------------- Pain Assessment Details Patient Name: Connor Ross Date of Service: 05/05/2019 3:30 PM Medical Record Number: [id number] Patient Account Number: [id number] Date of Birth/Sex: 05-04-1966 (53 y.o. M) Treating RN: Army Melia Primary Care Tonita Bills: Deborra Medina Other Clinician: Referring Adriano Bischof: Deborra Medina Treating Icess Bertoni/Extender: Tito Dine in Treatment: 1 Active Problems Location of Pain Severity and Description of Pain Patient Has Paino No Site Locations Pain Management and Medication Current Pain Management: Electronic Signature(s) Signed: 05/05/2019 4:16:59 PM By: Army Melia Entered By: Army Melia on 05/05/2019 15:36:55 Connor Ross ([id number]) -------------------------------------------------------------------------------- Patient/Caregiver Education Details Patient Name: Connor Ross Date of Service: 05/05/2019 3:30 PM Medical Record Number: [id number] Patient Account Number: [id number] Date of Birth/Gender: 1966-02-11 (53 y.o. M) Treating RN: Cornell Barman Primary Care Physician: Deborra Medina Other Clinician: Referring Physician: Deborra Medina Treating Physician/Extender: Dellia Nims  MICHAEL G Weeks in Treatment: 1 Education Assessment Education Provided To: Patient Education Topics Provided Wound/Skin Impairment: Handouts: Caring for Your Ulcer Methods: Demonstration, Explain/Verbal Responses: State content correctly Electronic Signature(s) Signed: 05/05/2019 4:31:46 PM By: Gretta Cool, BSN, RN, CWS, Kim RN, BSN Entered By: Gretta Cool, BSN, RN, CWS, Kim  on 05/05/2019 16:04:35 Connor Ross ([id number]) -------------------------------------------------------------------------------- Wound Assessment Details Patient Name: Connor Ross Date of Service: 05/05/2019 3:30 PM Medical Record Number: [id number] Patient Account Number: [id number] Date of Birth/Sex: 1966/03/28 (53 y.o. M) Treating RN: Army Melia Primary Care Roey Coopman: Deborra Medina Other Clinician: Referring Kazi Montoro: Deborra Medina Treating Meagen Limones/Extender: Tito Dine in Treatment: 1 Wound Status Wound Number: 1 Primary Etiology: Diabetic Wound/Ulcer of the Lower Extremity Wound Location: Left Toe Great Wound Status: Open Wounding Event: Gradually Appeared Comorbid Type II Diabetes, Osteoarthritis, Date Acquired: 05/15/2018 History: Neuropathy Weeks Of Treatment: 1 Clustered Wound: No Photos Wound Measurements Length: (cm) 0.2 % Reduction in Width: (cm) 0.3 % Reduction in Depth: (cm) 0.6 Epithelializati Area: (cm) 0.047 Tunneling: Volume: (cm) 0.028 Undermining: Area: 76% Volume: 71.4% on: None No No Wound Description Classification: Grade 2 Foul Odor After Wound Margin: Flat and Intact Slough/Fibrino Exudate Amount: Medium Exudate Type: Serous Exudate Color: amber Cleansing: No Yes Wound Bed Granulation Amount: Medium (34-66%) Exposed Structure Granulation Quality: Red Fascia Exposed: No Necrotic Amount: Medium (34-66%) Fat Layer (Subcutaneous Tissue) Exposed: Yes Necrotic Quality: Adherent Slough Tendon Exposed: No Muscle Exposed: No Joint Exposed: No Bone Exposed: No Treatment Notes Connor Ross, Connor Ross ([id number]) Wound #1 (Left Toe Great) Notes Silver alginate, foam, conform, tape to secure Electronic Signature(s) Signed: 05/05/2019 4:16:59 PM By: Army Melia Entered By: Army Melia on 05/05/2019 15:41:38 Connor Ross  ([id number]) -------------------------------------------------------------------------------- Vitals Details Patient Name: Connor Ross Date of Service: 05/05/2019 3:30 PM Medical Record Number: [id number] Patient Account Number: [id number] Date of Birth/Sex: 01/04/1966 (53 y.o. M) Treating RN: Army Melia Primary Care Johnathen Testa: Deborra Medina Other Clinician: Referring Macy Polio: Deborra Medina Treating Arael Piccione/Extender: Tito Dine in Treatment: 1 Vital Signs Time Taken: 15:36 Temperature (F): 98.2 Height (in): 71 Pulse (bpm): 67 Weight (lbs): 315 Respiratory Rate (breaths/min): 16 Body Mass Index (BMI): 43.9 Blood Pressure (mmHg): 141/78 Reference Range: 80 - 120 mg / dl Electronic Signature(s) Signed: 05/05/2019 4:16:59 PM By: Army Melia Entered By: Army Melia on 05/05/2019 15:37:33

## 2019-05-08 NOTE — Progress Notes (Signed)
Connor Ross, Connor Ross ([id number]) Visit Report for 05/05/2019 Debridement Details Patient Name: Connor Ross, Connor Ross Date of Service: 05/05/2019 3:30 PM Medical Record Number: [id number] Patient Account Number: [id number] Date of Birth/Sex: 03-18-1966 (53 y.o. M) Treating RN: Cornell Barman Primary Care Provider: Deborra Medina Other Clinician: Referring Provider: Deborra Medina Treating Provider/Extender: Tito Dine in Treatment: 1 Debridement Performed for Wound #1 Left Toe Great Assessment: Performed By: Physician Ricard Dillon, MD Debridement Type: Debridement Severity of Tissue Pre Fat layer exposed Debridement: Level of Consciousness (Pre- Awake and Alert procedure): Pre-procedure Verification/Time Yes - 14:01 Out Taken: Start Time: 14:01 Pain Control: Lidocaine Total Area Debrided (L x W): 0.3 (cm) x 0.5 (cm) = 0.15 (cm) Tissue and other material Viable, Non-Viable, Callus, Slough, Subcutaneous, Slough debrided: Level: Skin/Subcutaneous Tissue Debridement Description: Excisional Instrument: Curette Bleeding: Moderate Hemostasis Achieved: Pressure End Time: 14:03 Response to Treatment: Procedure was tolerated well Level of Consciousness Awake and Alert (Post-procedure): Post Debridement Measurements of Total Wound Length: (cm) 0.3 Width: (cm) 0.5 Depth: (cm) 0.6 Volume: (cm) 0.071 Character of Wound/Ulcer Post Debridement: Stable Severity of Tissue Post Debridement: Fat layer exposed Post Procedure Diagnosis Same as Pre-procedure Electronic Signature(s) Signed: 05/05/2019 5:06:38 PM By: Linton Ham MD Signed: 05/07/2019 5:23:28 PM By: Gretta Cool, BSN, RN, CWS, Kim RN, BSN Previous Signature: 05/05/2019 4:31:46 PM Version By: Gretta Cool, BSN, RN, CWS, Kim RN, BSN Entered By: Linton Ham on 05/05/2019 16:49:40 Connor Ross, Connor Ross ([id number]) Connor Ross  ([id number]) -------------------------------------------------------------------------------- HPI Details Patient Name: Connor Ross Date of Service: 05/05/2019 3:30 PM Medical Record Number: [id number] Patient Account Number: [id number] Date of Birth/Sex: 03-17-1966 (53 y.o. M) Treating RN: Cornell Barman Primary Care Provider: Deborra Medina Other Clinician: Referring Provider: Deborra Medina Treating Provider/Extender: Tito Dine in Treatment: 1 History of Present Illness HPI Description: ADMISSION 04/28/2019 This is a 53 year old man who is a type II diabetic with a history of polyneuropathy. He comes in for our review of a wound on the tip of his left great toe that he says is been there for about a year. He saw his primary physician on 04/23/2019 who I believe referred him here. Noting that he had had this for "several weeks". He has not had any x-rays of his foot. He has been cleaning this with peroxide applying essential oils and bacitracin. He relates the start of this to placing topical salicylic acid on some callus he had in this area about a year ago. He was using this for a plantar wart on the same foot he says this opened the entire area. Past medical history; type 2 diabetes with polyneuropathy, history of herniated disks. He is on Lasix and I believe on miCardis for albuminuria ABIs in our clinic were 1.09 on the right and 1.14 on the left 10/21. Neuropathic wound on the tip of his left great toe. I debrided this last week looked very healthy and superficial I thought it would be closed however he comes in today with overhanging skin and subcutaneous tissue over a small open area. Unfortunately this undermines and had to be all removed again. We use silver alginate. I am continuing this today Electronic Signature(s) Signed: 05/05/2019 5:06:38 PM By: Linton Ham MD Entered By: Linton Ham on 05/05/2019 16:50:31 Connor Ross  ([id number]) -------------------------------------------------------------------------------- Physical Exam Details Patient Name: Connor Ross Date of Service: 05/05/2019 3:30 PM Medical Record Number: [id number] Patient Account Number: [id number] Date of Birth/Sex: December 28, 1965 (53 y.o. M) Treating RN: Cornell Barman Primary Care Provider:  Deborra Medina Other Clinician: Referring Provider: Deborra Medina Treating Provider/Extender: Tito Dine in Treatment: 1 Constitutional Sitting or standing Blood Pressure is within target range for patient.. Pulse regular and within target range for patient.[name] Respirations regular, non-labored and within target range.. Temperature is normal and within the target range for the patient.[name] appears in no distress. Notes Wound exam; area on the tip of the left first toe. Required debridement with a #3 curette. Once again this cleans up into wound with minimal depth and healthy looking tissue. No evidence of infection. This does not probe to bone Electronic Signature(s) Signed: 05/05/2019 5:06:38 PM By: Linton Ham MD Entered By: Linton Ham on 05/05/2019 16:51:29 Connor Ross ([id number]) -------------------------------------------------------------------------------- Physician Orders Details Patient Name: Connor Ross Date of Service: 05/05/2019 3:30 PM Medical Record Number: [id number] Patient Account Number: [id number] Date of Birth/Sex: [birth year] (53 y.o. M) Treating RN: Cornell Barman Primary Care Provider: Deborra Medina Other Clinician: Referring Provider: Deborra Medina Treating Provider/Extender: Tito Dine in Treatment: 1 Verbal / Phone Orders: No Diagnosis Coding Wound Cleansing Wound #1 Left Toe Great o May Shower, gently pat wound dry prior to applying new dressing. - do not use scrub brush Anesthetic (add to Medication List) Wound #1 Left Toe Great o Topical Lidocaine 4% cream  applied to wound bed prior to debridement (In Clinic Only). Primary Wound Dressing o Silver Alginate Secondary Dressing o Foam - conform Dressing Change Frequency o Change dressing every other day. Follow-up Appointments o Return Appointment in 1 week. Edema Control o Elevate legs to the level of the heart and pump ankles as often as possible Off-Loading o Open toe surgical shoe with peg assist. - Left Electronic Signature(s) Signed: 05/05/2019 4:31:46 PM By: Gretta Cool, BSN, RN, CWS, Kim RN, BSN Signed: 05/05/2019 5:06:38 PM By: Linton Ham MD Entered By: Gretta Cool, BSN, RN, CWS, Kim on 05/05/2019 16:04:10 Connor Ross ([id number]) -------------------------------------------------------------------------------- Problem List Details Patient Name: Connor Ross Date of Service: 05/05/2019 3:30 PM Medical Record Number: [id number] Patient Account Number: [id number] Date of Birth/Sex: 10-27-1965 (53 y.o. M) Treating RN: Cornell Barman Primary Care Provider: Deborra Medina Other Clinician: Referring Provider: Deborra Medina Treating Provider/Extender: Tito Dine in Treatment: 1 Active Problems ICD-10 Evaluated Encounter Code Description Active Date Today Diagnosis E11.621 Type 2 diabetes mellitus with foot ulcer 04/28/2019 No Yes E11.42 Type 2 diabetes mellitus with diabetic polyneuropathy 04/28/2019 No Yes L97.522 Non-pressure chronic ulcer of other part of left foot with fat 04/28/2019 No Yes layer exposed Inactive Problems Resolved Problems Electronic Signature(s) Signed: 05/05/2019 5:06:38 PM By: Linton Ham MD Entered By: Linton Ham on 05/05/2019 16:49:18 Connor Ross ([id number]) -------------------------------------------------------------------------------- Progress Note Details Patient Name: Connor Ross Date of Service: 05/05/2019 3:30 PM Medical Record Number: [id number] Patient Account Number:  [id number] Date of Birth/Sex: [birth year] (53 y.o. 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 He comes in for our review of a wound on the tip of his left great toe that he says is been there for about a year. He saw his primary physician on 04/23/2019 who I believe referred him here. Noting that he had had this for "several weeks". He has not had any x-rays of his foot. He has been cleaning this with  peroxide applying essential oils and bacitracin. He relates the start of this to placing topical salicylic acid on some callus he had in this area about a year ago. He was using this for a plantar wart on the same foot he says this opened the entire area. Past medical history; type 2 diabetes with polyneuropathy, history of herniated disks. He is on Lasix and I believe on miCardis for albuminuria ABIs in our clinic were 1.09 on the right and 1.14 on the left 10/21. Neuropathic wound on the tip of his left great toe. I debrided this last week looked very healthy and superficial I thought it would be closed however he comes in today with overhanging skin and subcutaneous tissue over a small open area. Unfortunately this undermines and had to be all removed again. We use silver alginate. I am continuing this today Objective Constitutional Sitting or standing Blood Pressure is within target range for patient.. Pulse regular and within target range for patient.[name] Respirations regular, non-labored and within target range.. Temperature is normal and within the target range for the patient.[name] appears in no distress. Vitals Time Taken: 3:36 PM, Height: 71 in, Weight: 315 lbs, BMI: 43.9, Temperature: 98.2 F, Pulse: 67  bpm, Respiratory Rate: 16 breaths/min, Blood Pressure: 141/78 mmHg. General Notes: Wound exam; area on the tip of the left first toe. Required debridement with a #3 curette. Once again this cleans up into wound with minimal depth and healthy looking tissue. No evidence of infection. This does not probe to bone Integumentary (Hair, Skin) Wound #1 status is Open. Original cause of wound was Gradually Appeared. The wound is located on the Left Toe Great. The wound measures 0.2cm length x 0.3cm width x 0.6cm depth; 0.047cm^2 area and 0.028cm^3 volume. There is Fat Layer (Subcutaneous Tissue) Exposed exposed. There is no tunneling or undermining noted. There is a medium amount of serous drainage noted. The wound margin is flat and intact. There is medium (34-66%) red granulation within the wound bed. There [place], Connor Ross. ([id number]) is a medium (34-66%) amount of necrotic tissue within the wound bed including Adherent Slough. Assessment Active Problems ICD-10 Type 2 diabetes mellitus with foot ulcer Type 2 diabetes mellitus with diabetic polyneuropathy Non-pressure chronic ulcer of other part of left foot with fat layer exposed Procedures Wound #1 Pre-procedure diagnosis of Wound #1 is a Diabetic Wound/Ulcer of the Lower Extremity located on the Left Toe Great .Severity of Tissue Pre Debridement is: Fat layer exposed. There was a Excisional Skin/Subcutaneous Tissue Debridement with a total area of 0.15 sq cm performed by Ricard Dillon, MD. With the following instrument(s): Curette to remove Viable and Non-Viable tissue/material. Material removed includes Callus, Subcutaneous Tissue, and Slough after achieving pain control using Lidocaine. No specimens were taken. A time out was conducted at 14:01, prior to the start of the procedure. A Moderate amount of bleeding was controlled with Pressure. The procedure was tolerated well. Post Debridement Measurements: 0.3cm length x 0.5cm width  x 0.6cm depth; 0.071cm^3 volume. Character of Wound/Ulcer Post Debridement is stable. Severity of Tissue Post Debridement is: Fat layer exposed. Post procedure Diagnosis Wound #1: Same as Pre-Procedure Plan Wound Cleansing: Wound #1 Left Toe Great: May Shower, gently pat wound dry prior to applying new dressing. - do not use scrub brush Anesthetic (add to Medication List): Wound #1 Left Toe Great: Topical Lidocaine 4% cream applied to wound bed prior to debridement (In Clinic Only). Primary Wound Dressing: Silver Alginate Secondary Dressing: Foam - conform Dressing  Change Frequency: Change dressing every other day. Follow-up Appointments: Return Appointment in 1 week. Edema Control: Elevate legs to the level of the heart and pump ankles as often as possible Off-Loading: Open toe surgical shoe with peg assist. - Left [place], Connor Ross ([id number]) 1. I am continuing with silver alginate 2. Counseled to continue to offload the tip of his toe Electronic Signature(s) Signed: 05/05/2019 5:06:38 PM By: Linton Ham MD Entered By: Linton Ham on 05/05/2019 16:52:00 Connor Ross ([id number]) -------------------------------------------------------------------------------- SuperBill Details Patient Name: Connor Ross Date of Service: 05/05/2019 Medical Record Number: [id number] Patient Account Number: [id number] Date of Birth/Sex: [birth year] (53 y.o. M) Treating RN: Cornell Barman Primary Care Provider: Deborra Medina Other Clinician: Referring Provider: Deborra Medina Treating Provider/Extender: Tito Dine in Treatment: 1 Diagnosis Coding ICD-10 Codes Code Description [phone] Type 2 diabetes mellitus with foot ulcer E11.42 Type 2 diabetes mellitus with diabetic polyneuropathy L97.522 Non-pressure chronic ulcer of other part of left foot with fat layer exposed Facility Procedures CPT4 Code: [id number] Description: [id number] - DEB SUBQ TISSUE 20 SQ  CM/< ICD-10 Diagnosis Description E11.621 Type 2 diabetes mellitus with foot ulcer E11.42 Type 2 diabetes mellitus with diabetic polyneuropathy L97.522 Non-pressure chronic ulcer of other part of left foot with  fat Modifier: layer exposed Quantity: 1 Physician Procedures CPT4 Code: [id number] Description: [id number] - WC PHYS SUBQ TISS 20 SQ CM ICD-10 Diagnosis Description E11.621 Type 2 diabetes mellitus with foot ulcer E11.42 Type 2 diabetes mellitus with diabetic polyneuropathy L97.522 Non-pressure chronic ulcer of other part of left foot with  fat Modifier: layer exposed Quantity: 1 Electronic Signature(s) Signed: 05/06/2019 4:41:56 PM By: Gretta Cool, BSN, RN, CWS, Kim RN, BSN Entered By: Gretta Cool, BSN, RN, CWS, Kim on 05/06/2019 16:41:56

## 2019-05-12 ENCOUNTER — Encounter: Payer: 59 | Admitting: Internal Medicine

## 2019-05-12 ENCOUNTER — Other Ambulatory Visit: Payer: Self-pay

## 2019-05-12 DIAGNOSIS — E1140 Type 2 diabetes mellitus with diabetic neuropathy, unspecified: Secondary | ICD-10-CM | POA: Diagnosis not present

## 2019-05-12 DIAGNOSIS — L97522 Non-pressure chronic ulcer of other part of left foot with fat layer exposed: Secondary | ICD-10-CM | POA: Diagnosis not present

## 2019-05-12 DIAGNOSIS — E11621 Type 2 diabetes mellitus with foot ulcer: Secondary | ICD-10-CM | POA: Diagnosis not present

## 2019-05-12 DIAGNOSIS — B070 Plantar wart: Secondary | ICD-10-CM | POA: Diagnosis not present

## 2019-05-12 DIAGNOSIS — E1151 Type 2 diabetes mellitus with diabetic peripheral angiopathy without gangrene: Secondary | ICD-10-CM | POA: Diagnosis not present

## 2019-05-12 DIAGNOSIS — I10 Essential (primary) hypertension: Secondary | ICD-10-CM | POA: Diagnosis not present

## 2019-05-13 NOTE — Progress Notes (Signed)
LE, CLOUGHERTY ([id number]) Visit Report for 05/12/2019 Arrival Information Details Patient Name: Connor Ross, Connor Ross Date of Service: 05/12/2019 3:30 PM Medical Record Number: [id number] Patient Account Number: [id number] Date of Birth/Sex: April 05, 1966 (53 y.o. M) Treating RN: Cornell Barman Primary Care Fonda Rochon: Deborra Medina Other Clinician: Referring Jenaye Rickert: Deborra Medina Treating Cashawn Yanko/Extender: Tito Dine in Treatment: 2 Visit Information History Since Last Visit Added or deleted any medications: No Patient Arrived: Ambulatory Any new allergies or adverse reactions: No Arrival Time: 15:29 Had a fall or experienced change in No Accompanied By: self activities of daily living that may affect Transfer Assistance: None risk of falls: Patient Identification Verified: Yes Signs or symptoms of abuse/neglect since last visito No Secondary Verification Process Completed: Yes Hospitalized since last visit: No Patient Has Alerts: Yes Implantable device outside of the clinic excluding No Patient Alerts: DMII cellular tissue based products placed in the center aspirin 81 since last visit: Has Dressing in Place as Prescribed: Yes Pain Present Now: No Electronic Signature(s) Signed: 05/12/2019 4:16:31 PM By: Lorine Bears RCP, RRT, CHT Entered By: Lorine Bears on 05/12/2019 15:30:12 Connor Ross ([id number]) -------------------------------------------------------------------------------- Clinic Level of Care Assessment Details Patient Name: Connor Ross Date of Service: 05/12/2019 3:30 PM Medical Record Number: [id number] Patient Account Number: [id number] Date of Birth/Sex: Jan 13, 1966 (53 y.o. M) Treating RN: Cornell Barman Primary Care Tyanne Derocher: Deborra Medina Other Clinician: Referring Ashland Osmer: Deborra Medina Treating Zamara Cozad/Extender: Tito Dine in Treatment: 2 Clinic Level of Care  Assessment Items TOOL 4 Quantity Score []  - Use when only an EandM is performed on FOLLOW-UP visit 0 ASSESSMENTS - Nursing Assessment / Reassessment []  - Reassessment of Co-morbidities (includes updates in patient status) 0 X- 1 5 Reassessment of Adherence to Treatment Plan ASSESSMENTS - Wound and Skin Assessment / Reassessment X - Simple Wound Assessment / Reassessment - one wound 1 5 []  - 0 Complex Wound Assessment / Reassessment - multiple wounds []  - 0 Dermatologic / Skin Assessment (not related to wound area) ASSESSMENTS - Focused Assessment []  - Circumferential Edema Measurements - multi extremities 0 []  - 0 Nutritional Assessment / Counseling / Intervention []  - 0 Lower Extremity Assessment (monofilament, tuning fork, pulses) []  - 0 Peripheral Arterial Disease Assessment (using hand held doppler) ASSESSMENTS - Ostomy and/or Continence Assessment and Care []  - Incontinence Assessment and Management 0 []  - 0 Ostomy Care Assessment and Management (repouching, etc.) PROCESS - Coordination of Care X - Simple Patient / Family Education for ongoing care 1 15 []  - 0 Complex (extensive) Patient / Family Education for ongoing care []  - 0 Staff obtains Programmer, systems, Records, Test Results / Process Orders []  - 0 Staff telephones HHA, Nursing Homes / Clarify orders / etc []  - 0 Routine Transfer to another Facility (non-emergent condition) []  - 0 Routine Hospital Admission (non-emergent condition) []  - 0 New Admissions / Biomedical engineer / Ordering NPWT, Apligraf, etc. []  - 0 Emergency Hospital Admission (emergent condition) X- 1 10 Simple Discharge Coordination Connor Ross, Connor Ross. ([id number]) []  - 0 Complex (extensive) Discharge Coordination PROCESS - Special Needs []  - Pediatric / Minor Patient Management 0 []  - 0 Isolation Patient Management []  - 0 Hearing / Language / Visual special needs []  - 0 Assessment of Community assistance (transportation, D/C  planning, etc.) []  - 0 Additional assistance / Altered mentation []  - 0 Support Surface(s) Assessment (bed, cushion, seat, etc.) INTERVENTIONS - Wound Cleansing / Measurement X - Simple Wound Cleansing - one wound 1 5 []  -  0 Complex Wound Cleansing - multiple wounds []  - 0 Wound Imaging (photographs - any number of wounds) []  - 0 Wound Tracing (instead of photographs) X- 1 5 Simple Wound Measurement - one wound []  - 0 Complex Wound Measurement - multiple wounds INTERVENTIONS - Wound Dressings X - Small Wound Dressing one or multiple wounds 1 10 []  - 0 Medium Wound Dressing one or multiple wounds []  - 0 Large Wound Dressing one or multiple wounds []  - 0 Application of Medications - topical []  - 0 Application of Medications - injection INTERVENTIONS - Miscellaneous []  - External ear exam 0 []  - 0 Specimen Collection (cultures, biopsies, blood, body fluids, etc.) []  - 0 Specimen(s) / Culture(s) sent or taken to Lab for analysis []  - 0 Patient Transfer (multiple staff / Civil Service fast streamer / Similar devices) []  - 0 Simple Staple / Suture removal (25 or less) []  - 0 Complex Staple / Suture removal (26 or more) []  - 0 Hypo / Hyperglycemic Management (close monitor of Blood Glucose) []  - 0 Ankle / Brachial Index (ABI) - do not check if billed separately X- 1 5 Vital Signs Connor Ross, Connor Ross DISCHER. ([id number]) Has the patient been seen at the hospital within the last three years: Yes Total Score: 60 Level Of Care: New/Established - Level 2 Electronic Signature(s) Signed: 05/13/2019 10:04:30 AM By: Gretta Cool, BSN, RN, CWS, Kim RN, BSN Entered By: Gretta Cool, BSN, RN, CWS, Kim on 05/12/2019 16:01:10 Connor Ross ([id number]) -------------------------------------------------------------------------------- Encounter Discharge Information Details Patient Name: Connor Ross Date of Service: 05/12/2019 3:30 PM Medical Record Number: [id number] Patient Account Number:  [id number] Date of Birth/Sex: 12-Feb-1966 (53 y.o. M) Treating RN: Cornell Barman Primary Care Zamorah Ailes: Deborra Medina Other Clinician: Referring Lanitra Battaglini: Deborra Medina Treating Alayssa Flinchum/Extender: Tito Dine in Treatment: 2 Encounter Discharge Information Items Discharge Condition: Stable Ambulatory Status: Ambulatory Discharge Destination: Home Transportation: Private Auto Accompanied By: self Schedule Follow-up Appointment: Yes Clinical Summary of Care: Electronic Signature(s) Signed: 05/13/2019 10:04:30 AM By: Gretta Cool, BSN, RN, CWS, Kim RN, BSN Entered By: Gretta Cool, BSN, RN, CWS, Kim on 05/12/2019 16:02:34 Connor Ross ([id number]) -------------------------------------------------------------------------------- Lower Extremity Assessment Details Patient Name: Connor Ross Date of Service: 05/12/2019 3:30 PM Medical Record Number: [id number] Patient Account Number: [id number] Date of Birth/Sex: Jun 27, 1966 (53 y.o. M) Treating RN: Army Melia Primary Care Maitri Schnoebelen: Deborra Medina Other Clinician: Referring Jimmey Hengel: Deborra Medina Treating Eydan Chianese/Extender: Ricard Dillon Weeks in Treatment: 2 Edema Assessment Assessed: [Left: No] [Right: No] Edema: [Left: N] [Right: o] Vascular Assessment Pulses: Dorsalis Pedis Palpable: [Left:Yes] Electronic Signature(s) Signed: 05/12/2019 4:32:44 PM By: Army Melia Entered By: Army Melia on 05/12/2019 15:34:25 Connor Ross ([id number]) -------------------------------------------------------------------------------- Multi Wound Chart Details Patient Name: Connor Ross Date of Service: 05/12/2019 3:30 PM Medical Record Number: [id number] Patient Account Number: [id number] Date of Birth/Sex: [birth year] (53 y.o. M) Treating RN: Cornell Barman Primary Care Skyelynn Rambeau: Deborra Medina Other Clinician: Referring Rishabh Rinkenberger: Deborra Medina Treating Hila Bolding/Extender: Tito Dine in  Treatment: 2 Vital Signs Height(in): 71 Pulse(bpm): 87 Weight(lbs): 315 Blood Pressure(mmHg): 124/64 Body Mass Index(BMI): 44 Temperature(F): 98.2 Respiratory Rate 16 (breaths/min): Photos: [N/A:N/A] Wound Location: Left Toe Great N/A N/A Wounding Event: Gradually Appeared N/A N/A Primary Etiology: Diabetic Wound/Ulcer of the N/A N/A Lower Extremity Comorbid History: Type II Diabetes, N/A N/A Osteoarthritis, Neuropathy Date Acquired: 05/15/2018 N/A N/A Weeks of Treatment: 2 N/A N/A Wound Status: Healed - Epithelialized N/A N/A Measurements L x W x D 0x0x0 N/A N/A (cm) Area (cm) : 0 N/A  N/A Volume (cm) : 0 N/A N/A % Reduction in Area: 100.00% N/A N/A % Reduction in Volume: 100.00% N/A N/A Classification: Grade 2 N/A N/A Exudate Amount: Medium N/A N/A Exudate Type: Serous N/A N/A Exudate Color: amber N/A N/A Wound Margin: Flat and Intact N/A N/A Granulation Amount: None Present (0%) N/A N/A Necrotic Amount: Large (67-100%) N/A N/A Necrotic Tissue: Eschar N/A N/A Exposed Structures: Fascia: No N/A N/A Fat Layer (Subcutaneous Tissue) Exposed: No Tendon: No Muscle: No Connor Ross, Connor Ross ([id number]) Joint: No Bone: No Epithelialization: None N/A N/A Treatment Notes Electronic Signature(s) Signed: 05/12/2019 5:54:02 PM By: Linton Ham MD Entered By: Linton Ham on 05/12/2019 17:38:08 Connor Ross ([id number]) -------------------------------------------------------------------------------- [place] Details Patient Name: Connor Ross Date of Service: 05/12/2019 3:30 PM Medical Record Number: [id number] Patient Account Number: [id number] Date of Birth/Sex: Nov 14, 1965 (53 y.o. M) Treating RN: Cornell Barman Primary Care Maralyn Witherell: Deborra Medina Other Clinician: Referring Lavella Myren: Deborra Medina Treating Sharlyne Koeneman/Extender: Tito Dine in Treatment: 2 Active Inactive Electronic Signature(s) Signed:  05/13/2019 10:04:30 AM By: Gretta Cool, BSN, RN, CWS, Kim RN, BSN Entered By: Gretta Cool, BSN, RN, CWS, Kim on 05/12/2019 15:58:44 Connor Ross ([id number]) -------------------------------------------------------------------------------- Pain Assessment Details Patient Name: Connor Ross Date of Service: 05/12/2019 3:30 PM Medical Record Number: [id number] Patient Account Number: [id number] Date of Birth/Sex: 09-11-1965 (53 y.o. M) Treating RN: Cornell Barman Primary Care Tanmay Halteman: Deborra Medina Other Clinician: Referring Sabrine Patchen: Deborra Medina Treating Pressley Tadesse/Extender: Tito Dine in Treatment: 2 Active Problems Location of Pain Severity and Description of Pain Patient Has Paino No Site Locations Pain Management and Medication Current Pain Management: Electronic Signature(s) Signed: 05/12/2019 4:16:31 PM By: Lorine Bears RCP, RRT, CHT Signed: 05/13/2019 10:04:30 AM By: Gretta Cool, BSN, RN, CWS, Kim RN, BSN Entered By: Lorine Bears on 05/12/2019 15:30:20 Connor Ross ([id number]) -------------------------------------------------------------------------------- Patient/Caregiver Education Details Patient Name: Connor Ross Date of Service: 05/12/2019 3:30 PM Medical Record Number: [id number] Patient Account Number: [id number] Date of Birth/Gender: 02-Sep-1965 (53 y.o. M) Treating RN: Cornell Barman Primary Care Physician: Deborra Medina Other Clinician: Referring Physician: Deborra Medina Treating Physician/Extender: Tito Dine in Treatment: 2 Education Assessment Education Provided To: Patient Education Topics Provided Pressure: Handouts: Other: keep pressure off of toe Methods: Explain/Verbal Responses: State content correctly Wound/Skin Impairment: Methods: Science writer) Signed: 05/13/2019 10:04:30 AM By: Gretta Cool, BSN, RN, CWS, Kim RN, BSN Entered By: Gretta Cool, BSN, RN, CWS, Kim  on 05/12/2019 16:02:13 Connor Ross ([id number]) -------------------------------------------------------------------------------- Wound Assessment Details Patient Name: Connor Ross Date of Service: 05/12/2019 3:30 PM Medical Record Number: [id number] Patient Account Number: [id number] Date of Birth/Sex: 1966-01-30 (53 y.o. M) Treating RN: Cornell Barman Primary Care Emberlin Verner: Deborra Medina Other Clinician: Referring Aveer Bartow: Deborra Medina Treating Jonty Morrical/Extender: Tito Dine in Treatment: 2 Wound Status Wound Number: 1 Primary Etiology: Diabetic Wound/Ulcer of the Lower Extremity Wound Location: Left Toe Great Wound Status: Healed - Epithelialized Wounding Event: Gradually Appeared Comorbid Type II Diabetes, Osteoarthritis, Date Acquired: 05/15/2018 History: Neuropathy Weeks Of Treatment: 2 Clustered Wound: No Photos Wound Measurements Length: (cm) 0 % Reduction Width: (cm) 0 % Reduction Depth: (cm) 0 Epitheliali Area: (cm) 0 Tunneling: Volume: (cm) 0 Underminin in Area: 100% in Volume: 100% zation: None No g: No Wound Description Classification: Grade 2 Foul Odor A Wound Margin: Flat and Intact Slough/Fibr Exudate Amount: Medium Exudate Type: Serous Exudate Color: amber fter Cleansing: No ino Yes Wound Bed Granulation Amount: None Present (0%) Exposed Structure Necrotic Amount:  Large (67-100%) Fascia Exposed: No Necrotic Quality: Eschar Fat Layer (Subcutaneous Tissue) Exposed: No Tendon Exposed: No Muscle Exposed: No Joint Exposed: No Bone Exposed: No Electronic Signature(s) Connor Ross, Connor Ross ([id number]) Signed: 05/13/2019 10:04:30 AM By: Gretta Cool, BSN, RN, CWS, Kim RN, BSN Entered By: Gretta Cool, BSN, RN, CWS, Kim on 05/12/2019 15:58:28 Connor Ross ([id number]) -------------------------------------------------------------------------------- Vitals Details Patient Name: Connor Ross Date of Service:  05/12/2019 3:30 PM Medical Record Number: [id number] Patient Account Number: [id number] Date of Birth/Sex: 11/23/1965 (53 y.o. M) Treating RN: Army Melia Primary Care Treydon Henricks: Deborra Medina Other Clinician: Referring Pattie Flaharty: Deborra Medina Treating Karsen Nakanishi/Extender: Tito Dine in Treatment: 2 Vital Signs Time Taken: 15:33 Temperature (F): 98.2 Height (in): 71 Pulse (bpm): 87 Weight (lbs): 315 Respiratory Rate (breaths/min): 16 Body Mass Index (BMI): 43.9 Blood Pressure (mmHg): 124/64 Reference Range: 80 - 120 mg / dl Electronic Signature(s) Signed: 05/12/2019 4:32:44 PM By: Army Melia Entered By: Army Melia on 05/12/2019 15:33:40

## 2019-05-13 NOTE — Progress Notes (Signed)
MERGIM, ROTHWELL ([id number]) Visit Report for 05/12/2019 HPI Details Patient Name: Connor Ross, Connor Ross Date of Service: 05/12/2019 3:30 PM Medical Record Number: [id number] Patient Account Number: [id number] Date of Birth/Sex: Aug 10, 1965 (53 y.o. 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 He comes in for our review of a wound on the tip of his left great toe that he says is been there for about a year. He saw his primary physician on 04/23/2019 who I believe referred him here. Noting that he had had this for "several weeks". He has not had any x-rays of his foot. He has been cleaning this with peroxide applying essential oils and bacitracin. He relates the start of this to placing topical salicylic acid on some callus he had in this area about a year ago. He was using this for a plantar wart on the same foot he says this opened the entire area. Past medical history; type 2 diabetes with polyneuropathy, history of herniated disks. He is on Lasix and I believe on miCardis for albuminuria ABIs in our clinic were 1.09 on the right and 1.14 on the left 10/21. Neuropathic wound on the tip of his left great toe. I debrided this last week looked very healthy and superficial I thought it would be closed however he comes in today with overhanging skin and subcutaneous tissue over a small open area. Unfortunately this undermines and had to be all removed again. We use silver alginate. I am continuing this today 10/28; the patient comes in with healed surface on the wound on the tip of his left great toe. He is a type II diabetic with neuropathy. I think this is a neuropathic wound. Even today he has some  degree of thick skin around this wound indicative of unrelieved pressure and friction Electronic Signature(s) Signed: 05/12/2019 5:54:02 PM By: Linton Ham MD Entered By: Linton Ham on 05/12/2019 17:38:56 Connor Ross ([id number]) -------------------------------------------------------------------------------- Physical Exam Details Patient Name: Connor Ross Date of Service: 05/12/2019 3:30 PM Medical Record Number: [id number] Patient Account Number: [id number] Date of Birth/Sex: Jan 24, 1966 (53 y.o. M) Treating RN: Cornell Barman Primary Care Provider: Deborra Medina Other Clinician: Referring Provider: Deborra Medina Treating Provider/Extender: Tito Dine in Treatment: 2 Constitutional Sitting or standing Blood Pressure is within target range for patient.. Pulse regular and within target range for patient.[name] Respirations regular, non-labored and within target range.. Temperature is normal and within the target range for the patient.[name] appears in no distress. Notes Wound exam; the area on the tip of the left first toe. The surface is closed there is no open wound here. There is still some thick skin and callus in this area indicative of pressure/friction. He is neuropathic. I have told him he is going to have to offload this aggressively going forward. He does not have a vascular issue at this point. Peripheral pulses are palpable Electronic Signature(s) Signed: 05/12/2019 5:54:02 PM By: Linton Ham MD Entered By: Linton Ham on 05/12/2019 17:40:07 Connor Ross ([id number]) -------------------------------------------------------------------------------- Physician Orders Details Patient Name: Connor Ross Date of Service: 05/12/2019 3:30 PM Medical Record Number: [id number] Patient Account Number: [id number] Date of Birth/Sex: October 10, 1965 (53 y.o. M) Treating RN: Cornell Barman Primary Care Provider: Deborra Medina Other  Clinician: Referring Provider: [name]  Helene Kelp Treating Provider/Extender: Tito Dine in Treatment: 2 Verbal / Phone Orders: No Diagnosis Coding Off-Loading o Other: - No pressure on great toe Discharge From [place] o Discharge from [place] complete Electronic Signature(s) Signed: 05/12/2019 5:54:02 PM By: Linton Ham MD Signed: 05/13/2019 10:04:30 AM By: Gretta Cool, BSN, RN, CWS, Kim RN, BSN Entered By: Gretta Cool, BSN, RN, CWS, Kim on 05/12/2019 16:00:31 Connor Ross ([id number]) -------------------------------------------------------------------------------- Problem List Details Patient Name: Connor Ross Date of Service: 05/12/2019 3:30 PM Medical Record Number: [id number] Patient Account Number: [id number] Date of Birth/Sex: 1966-03-11 (53 y.o. 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 He comes in for our review of a wound on the tip of his left great toe that he says is been there for about a year. He saw his primary physician on 04/23/2019 who I believe referred him here. Noting that he had had this for "several weeks". He has not had any x-rays of his foot. He has been cleaning this with peroxide applying essential oils and bacitracin. He relates the start of this to placing topical salicylic acid on some callus he had in this area about a year ago. He was using this for a plantar wart on the same foot he says this opened the entire area. Past medical history; type 2 diabetes with polyneuropathy, history of herniated disks. He is on Lasix and I believe on miCardis for albuminuria ABIs in our clinic were 1.09 on the right and 1.14 on the left 10/21. Neuropathic wound on the tip of his left great toe. I debrided this last week looked very healthy and superficial I thought it would be closed however he comes in today with overhanging skin and subcutaneous tissue over a small open area. Unfortunately this undermines and had to be all removed again. We use silver alginate. I am continuing this today 10/28; the patient comes in with healed surface on the wound on the tip of his left great toe. He is a type II diabetic with neuropathy. I think this is a neuropathic wound. Even today he has some degree of thick skin around this wound indicative of unrelieved pressure and friction Objective Constitutional Sitting or standing Blood Pressure is within target range for patient.. Pulse regular and within target range for patient.[name] Respirations regular, non-labored and within target range.. Temperature is normal and within  the target range for the patient.[name] appears in no distress. Vitals Time Taken: 3:33 PM, Height: 71 in, Weight: 315  lbs, BMI: 43.9, Temperature: 98.2 F, Pulse: 87 bpm, Respiratory Rate: 16 breaths/min, Blood Pressure: 124/64 mmHg. General Notes: Wound exam; the area on the tip of the left first toe. The surface is closed there is no open wound here. There is still some thick skin and callus in this area indicative of pressure/friction. He is neuropathic. I have told him he is going to have to offload this aggressively going forward. He does not have a vascular issue at this point. Peripheral pulses are palpable AMIIR, WOODING. ([id number]) Integumentary (Hair, Skin) Wound #1 status is Healed - Epithelialized. Original cause of wound was Gradually Appeared. The wound is located on the Left Toe Great. The wound measures 0cm length x 0cm width x 0cm depth; 0cm^2 area and 0cm^3 volume. There is no tunneling or undermining noted. There is a medium amount of serous drainage noted. The wound margin is flat and intact. There is no granulation within the wound bed. There is a large (67-100%) amount of necrotic tissue within the wound bed including Eschar. Assessment Active Problems ICD-10 Type 2 diabetes mellitus with foot ulcer Type 2 diabetes mellitus with diabetic polyneuropathy Non-pressure chronic ulcer of other part of left foot with fat layer exposed Plan Off-Loading: Other: - No pressure on great toe Discharge From [facility] Services: Discharge from [place] complete 1. The patient can be discharged from the wound care clinic 2. I tried to tell him that he will need to keep this area covered in order to relieve pressure when he returns to his normal footwear. Looking at the wear in the surgical shoe he had it is all leaning over medially. He probably inverts when he walks putting added pressure on the first toe. 3. It is possible he may need diabetic foot wear in  the not too distant future if we cannot maintain skin integrity in this area Electronic Signature(s) Signed: 05/12/2019 5:54:02 PM By: Linton Ham MD Entered By: Linton Ham on 05/12/2019 17:41:24 Connor Ross ([id number]) -------------------------------------------------------------------------------- SuperBill Details Patient Name: Connor Ross Date of Service: 05/12/2019 Medical Record Number: [id number] Patient Account Number: [id number] Date of Birth/Sex: May 16, 1966 (53 y.o. M) Treating RN: Cornell Barman Primary Care Provider: Deborra Medina Other Clinician: Referring Provider: Deborra Medina Treating Provider/Extender: Tito Dine in Treatment: 2 Diagnosis Coding ICD-10 Codes Code Description [phone] Type 2 diabetes mellitus with foot ulcer E11.42 Type 2 diabetes mellitus with diabetic polyneuropathy L97.522 Non-pressure chronic ulcer of other part of left foot with fat layer exposed Facility Procedures CPT4 Code: [id number] Description: [phone] - WOUND CARE VISIT-LEV 2 EST PT Modifier: Quantity: 1 Physician Procedures CPT4 Code: [id number] Description: [phone] - WC PHYS LEVEL 2 - EST PT ICD-10 Diagnosis Description E11.621 Type 2 diabetes mellitus with foot ulcer E11.42 Type 2 diabetes mellitus with diabetic polyneuropathy L97.522 Non-pressure chronic ulcer of other part of left foot with  fat Modifier: layer exposed Quantity: 1 Electronic Signature(s) Signed: 05/12/2019 5:54:02 PM By: Linton Ham MD Entered By: Linton Ham on 05/12/2019 17:41:44

## 2019-05-18 ENCOUNTER — Other Ambulatory Visit: Payer: Self-pay | Admitting: Internal Medicine

## 2019-05-19 ENCOUNTER — Ambulatory Visit: Payer: 59 | Admitting: Internal Medicine

## 2019-06-09 ENCOUNTER — Telehealth: Payer: Self-pay | Admitting: Internal Medicine

## 2019-06-09 NOTE — Telephone Encounter (Signed)
Pt called in because he was told that PA is needed for empagliflozin (JARDIANCE) 25 MG TABS tablet   Please assist.

## 2019-06-14 ENCOUNTER — Ambulatory Visit: Payer: 59 | Admitting: Physician Assistant

## 2019-06-14 ENCOUNTER — Telehealth: Payer: Self-pay | Admitting: Internal Medicine

## 2019-06-14 NOTE — Telephone Encounter (Signed)
Copied from [place] [phone]. Topic: Quick Communication - Rx Refill/Question >> Jun 14, 2019  9:40 AM Rainey Pines A wrote: Medication: empagliflozin (JARDIANCE) 25 MG TABS tablet ([facility] is requesting patient prior authorization be called in to [phone] and faxed to [phone] as soon as possible. Patient is completely out of medication.)  Has the patient contacted their pharmacy?Yes (Agent: If no, request that the patient contact the pharmacy for the refill.) (Agent: If yes, when and what did the pharmacy advise?)Contact PCP  Preferred Pharmacy (with phone number or street name): [place], [place] [phone] (Phone) [phone] (Fax)    Agent: Please be advised that RX refills may take up to 3 business days. We ask that you follow-up with your pharmacy.

## 2019-06-14 NOTE — Telephone Encounter (Signed)
PA has been submitted on covermymeds.  

## 2019-06-14 NOTE — Telephone Encounter (Signed)
PA has been submitted on covermyeds.

## 2019-06-15 HISTORY — PX: EXOSTECTOMY: SHX1542

## 2019-06-16 DIAGNOSIS — S92351D Displaced fracture of fifth metatarsal bone, right foot, subsequent encounter for fracture with routine healing: Secondary | ICD-10-CM | POA: Diagnosis not present

## 2019-06-23 ENCOUNTER — Other Ambulatory Visit: Payer: Self-pay | Admitting: Internal Medicine

## 2019-07-05 ENCOUNTER — Other Ambulatory Visit: Payer: Self-pay | Admitting: Internal Medicine

## 2019-07-15 DIAGNOSIS — L84 Corns and callosities: Secondary | ICD-10-CM | POA: Diagnosis not present

## 2019-07-15 DIAGNOSIS — M898X7 Other specified disorders of bone, ankle and foot: Secondary | ICD-10-CM | POA: Diagnosis not present

## 2019-07-15 DIAGNOSIS — E1142 Type 2 diabetes mellitus with diabetic polyneuropathy: Secondary | ICD-10-CM | POA: Diagnosis not present

## 2019-08-04 DIAGNOSIS — H60331 Swimmer's ear, right ear: Secondary | ICD-10-CM | POA: Diagnosis not present

## 2019-08-12 DIAGNOSIS — E1142 Type 2 diabetes mellitus with diabetic polyneuropathy: Secondary | ICD-10-CM | POA: Diagnosis not present

## 2019-08-12 DIAGNOSIS — L97411 Non-pressure chronic ulcer of right heel and midfoot limited to breakdown of skin: Secondary | ICD-10-CM | POA: Diagnosis not present

## 2019-08-12 DIAGNOSIS — S92351D Displaced fracture of fifth metatarsal bone, right foot, subsequent encounter for fracture with routine healing: Secondary | ICD-10-CM | POA: Diagnosis not present

## 2019-08-12 DIAGNOSIS — M898X7 Other specified disorders of bone, ankle and foot: Secondary | ICD-10-CM | POA: Diagnosis not present

## 2019-08-15 ENCOUNTER — Other Ambulatory Visit: Payer: Self-pay | Admitting: Internal Medicine

## 2019-08-15 DIAGNOSIS — M5416 Radiculopathy, lumbar region: Secondary | ICD-10-CM

## 2019-08-15 DIAGNOSIS — M48061 Spinal stenosis, lumbar region without neurogenic claudication: Secondary | ICD-10-CM

## 2019-08-16 NOTE — Telephone Encounter (Signed)
Refilled: 02/23/2019 Last OV: 04/23/2019 Next OV: not scheduled

## 2019-08-18 DIAGNOSIS — H60331 Swimmer's ear, right ear: Secondary | ICD-10-CM | POA: Diagnosis not present

## 2019-11-06 ENCOUNTER — Other Ambulatory Visit: Payer: Self-pay | Admitting: Internal Medicine

## 2019-11-25 ENCOUNTER — Telehealth: Payer: Self-pay | Admitting: Internal Medicine

## 2019-11-25 NOTE — Telephone Encounter (Signed)
Pt request call back in 1 month (beginning of June) to schedule Medicare Annual Wellness Visit (AWV) either virtually or audio only.  No hx of AWV; please schedule at anytime with Denisa O'Brien-Blaney at [facility] 10/14/2018

## 2019-12-28 ENCOUNTER — Telehealth: Payer: Self-pay | Admitting: Internal Medicine

## 2019-12-28 NOTE — Telephone Encounter (Signed)
Pt's insurance is ending at the end of the month. It will no longer cover Humalog but will cover Novalog and pt would like a call back to discuss.

## 2019-12-29 ENCOUNTER — Telehealth: Payer: Self-pay

## 2019-12-29 NOTE — Telephone Encounter (Signed)
Spoke with pt today and he stated that he is ready to reschedule his AWV. Pt stated that you could give him a call at anytime now.

## 2019-12-29 NOTE — Telephone Encounter (Signed)
Spoke with pt and he stated that he found out yesterday that his insurance will not actually change until September. Advised pt that he should call the office back once his insurance changes so that we can change his medication to the preferred for the insurance that he will have then. Pt gave a verbal understanding.

## 2020-01-03 ENCOUNTER — Ambulatory Visit (INDEPENDENT_AMBULATORY_CARE_PROVIDER_SITE_OTHER): Payer: 59

## 2020-01-03 ENCOUNTER — Other Ambulatory Visit: Payer: Self-pay | Admitting: Internal Medicine

## 2020-01-03 VITALS — Ht 71.0 in | Wt 313.0 lb

## 2020-01-03 DIAGNOSIS — Z0000 Encounter for general adult medical examination without abnormal findings: Secondary | ICD-10-CM

## 2020-01-03 NOTE — Patient Instructions (Addendum)
Connor Ross , Thank you for taking time to come for your Medicare Wellness Visit. I appreciate your ongoing commitment to your health goals. Please review the following plan we discussed and let me know if I can assist you in the future.   These are the goals we discussed: Goals    . Increase physical activity     Healthy diet  Stay hydrated Stay active       This is a list of the screening recommended for you and due dates:  Health Maintenance  Topic Date Due  . Hemoglobin A1C  10/18/2019  . Complete foot exam   01/05/2020*  . COVID-19 Vaccine (1) 01/19/2020*  . Pneumococcal vaccine  01/02/2021*  . HIV Screening  01/02/2021*  . Eye exam for diabetics  06/28/2020  . Cologuard (Stool DNA test)  04/13/2021  . Tetanus Vaccine  10/15/2021  .  Hepatitis C: One time screening is recommended by Center for Disease Control  (CDC) for  adults born from [birth year] through 1967.   Completed  . Flu Shot  Discontinued  *Topic was postponed. The date shown is not the original due date.    Immunizations Immunization History  Administered Date(s) Administered  . Tdap 10/16/2011   Advanced directives: declined.  Conditions/risks identified: none  Follow up in one year for your annual wellness visit.  Follow up 01/05/20 @ 9:00  Preventive Care 40-64 Years, Male Preventive care refers to lifestyle choices and visits with your health care provider that can promote health and wellness. What does preventive care include?  A yearly physical exam. This is also called an annual well check.  Dental exams once or twice a year.  Routine eye exams. Ask your health care provider how often you should have your eyes checked.  Personal lifestyle choices, including:  Daily care of your teeth and gums.  Regular physical activity.  Eating a healthy diet.  Avoiding tobacco and drug use.  Limiting alcohol use.  Practicing safe sex.  Taking low-dose aspirin every day starting at age 100. What  happens during an annual well check? The services and screenings done by your health care provider during your annual well check will depend on your age, overall health, lifestyle risk factors, and family history of disease. Counseling  Your health care provider may ask you questions about your:  Alcohol use.  Tobacco use.  Drug use.  Emotional well-being.  Home and relationship well-being.  Sexual activity.  Eating habits.  Work and work Statistician. Screening  You may have the following tests or measurements:  Height, weight, and BMI.  Blood pressure.  Lipid and cholesterol levels. These may be checked every 5 years, or more frequently if you are over 23 years old.  Skin check.  Lung cancer screening. You may have this screening every year starting at age 80 if you have a 30-pack-year history of smoking and currently smoke or have quit within the past 15 years.  Fecal occult blood test (FOBT) of the stool. You may have this test every year starting at age 49.  Flexible sigmoidoscopy or colonoscopy. You may have a sigmoidoscopy every 5 years or a colonoscopy every 10 years starting at age 57.  Prostate cancer screening. Recommendations will vary depending on your family history and other risks.  Hepatitis C blood test.  Hepatitis B blood test.  Sexually transmitted disease (STD) testing.  Diabetes screening. This is done by checking your blood sugar (glucose) after you have not eaten for a while (  fasting). You may have this done every 1-3 years. Discuss your test results, treatment options, and if necessary, the need for more tests with your health care provider. Vaccines  Your health care provider may recommend certain vaccines, such as:  Influenza vaccine. This is recommended every year.  Tetanus, diphtheria, and acellular pertussis (Tdap, Td) vaccine. You may need a Td booster every 10 years.  Zoster vaccine. You may need this after age 60.  Pneumococcal  13-valent conjugate (PCV13) vaccine. You may need this if you have certain conditions and have not been vaccinated.  Pneumococcal polysaccharide (PPSV23) vaccine. You may need one or two doses if you smoke cigarettes or if you have certain conditions. Talk to your health care provider about which screenings and vaccines you need and how often you need them. This information is not intended to replace advice given to you by your health care provider. Make sure you discuss any questions you have with your health care provider. Document Released: 07/28/2015 Document Revised: 03/20/2016 Document Reviewed: 05/02/2015 Elsevier Interactive Patient Education  2017 [place] Prevention in the Home Falls can cause injuries. They can happen to people of all ages. There are many things you can do to make your home safe and to help prevent falls. What can I do on the outside of my home?  Regularly fix the edges of walkways and driveways and fix any cracks.  Remove anything that might make you trip as you walk through a door, such as a raised step or threshold.  Trim any bushes or trees on the path to your home.  Use bright outdoor lighting.  Clear any walking paths of anything that might make someone trip, such as rocks or tools.  Regularly check to see if handrails are loose or broken. Make sure that both sides of any steps have handrails.  Any raised decks and porches should have guardrails on the edges.  Have any leaves, snow, or ice cleared regularly.  Use sand or salt on walking paths during winter.  Clean up any spills in your garage right away. This includes oil or grease spills. What can I do in the bathroom?  Use night lights.  Install grab bars by the toilet and in the tub and shower. Do not use towel bars as grab bars.  Use non-skid mats or decals in the tub or shower.  If you need to sit down in the shower, use a plastic, non-slip stool.  Keep the floor dry. Clean  up any water that spills on the floor as soon as it happens.  Remove soap buildup in the tub or shower regularly.  Attach bath mats securely with double-sided non-slip rug tape.  Do not have throw rugs and other things on the floor that can make you trip. What can I do in the bedroom?  Use night lights.  Make sure that you have a light by your bed that is easy to reach.  Do not use any sheets or blankets that are too big for your bed. They should not hang down onto the floor.  Have a firm chair that has side arms. You can use this for support while you get dressed.  Do not have throw rugs and other things on the floor that can make you trip. What can I do in the kitchen?  Clean up any spills right away.  Avoid walking on wet floors.  Keep items that you use a lot in easy-to-reach places.  If you  need to reach something above you, use a strong step stool that has a grab bar.  Keep electrical cords out of the way.  Do not use floor polish or wax that makes floors slippery. If you must use wax, use non-skid floor wax.  Do not have throw rugs and other things on the floor that can make you trip. What can I do with my stairs?  Do not leave any items on the stairs.  Make sure that there are handrails on both sides of the stairs and use them. Fix handrails that are broken or loose. Make sure that handrails are as long as the stairways.  Check any carpeting to make sure that it is firmly attached to the stairs. Fix any carpet that is loose or worn.  Avoid having throw rugs at the top or bottom of the stairs. If you do have throw rugs, attach them to the floor with carpet tape.  Make sure that you have a light switch at the top of the stairs and the bottom of the stairs. If you do not have them, ask someone to add them for you. What else can I do to help prevent falls?  Wear shoes that:  Do not have high heels.  Have rubber bottoms.  Are comfortable and fit you well.  Are  closed at the toe. Do not wear sandals.  If you use a stepladder:  Make sure that it is fully opened. Do not climb a closed stepladder.  Make sure that both sides of the stepladder are locked into place.  Ask someone to hold it for you, if possible.  Clearly mark and make sure that you can see:  Any grab bars or handrails.  First and last steps.  Where the edge of each step is.  Use tools that help you move around (mobility aids) if they are needed. These include:  Canes.  Walkers.  Scooters.  Crutches.  Turn on the lights when you go into a dark area. Replace any light bulbs as soon as they burn out.  Set up your furniture so you have a clear path. Avoid moving your furniture around.  If any of your floors are uneven, fix them.  If there are any pets around you, be aware of where they are.  Review your medicines with your doctor. Some medicines can make you feel dizzy. This can increase your chance of falling. Ask your doctor what other things that you can do to help prevent falls. This information is not intended to replace advice given to you by your health care provider. Make sure you discuss any questions you have with your health care provider. Document Released: 04/27/2009 Document Revised: 12/07/2015 Document Reviewed: 08/05/2014 Elsevier Interactive Patient Education  2017 [employer].

## 2020-01-03 NOTE — Progress Notes (Addendum)
Subjective:   Connor Ross is a 54 y.o. male who presents for an Initial Medicare Annual Wellness Visit.  Review of Systems    No ROS.  Medicare Wellness Virtual Visit.   Cardiac Risk Factors include: advanced age (>4mn, >>3women);diabetes mellitus;hypertension;male gender     Objective:    Today's Vitals   01/03/20 1410  Weight: (!) 313 lb (142 kg)  Height: 5' 11"  (1.803 m)   Body mass index is 43.65 kg/m.  Advanced Directives 01/03/2020  Does Patient Have a Medical Advance Directive? No  Would patient like information on creating a medical advance directive? No - Guardian declined    Current Medications (verified) Outpatient Encounter Medications as of 01/03/2020  Medication Sig  . ASPIRIN LOW DOSE 81 MG EC tablet TAKE 1 TABLET BY MOUTH  DAILY (SWALLOW WHOLE)  . blood glucose meter kit and supplies Dispense based on patient and insurance preference. Use up to four times daily as directed. (FOR ICD-10 E10.9, E11.9). Use to check blood sugar three times daily  . empagliflozin (JARDIANCE) 25 MG TABS tablet Take 25 mg by mouth daily.  . furosemide (LASIX) 20 MG tablet TAKE 1 TABLET BY MOUTH  EVERY OTHER DAY  . gabapentin (NEURONTIN) 100 MG capsule TAKE 1 CAPSULE BY MOUTH 3  TIMES DAILY  . glipiZIDE (GLUCOTROL) 10 MG tablet TAKE 1 TABLET BY MOUTH  TWICE DAILY BEFORE MEALS  . Insulin Lispro Prot & Lispro (HUMALOG MIX 75/25 KWIKPEN) (75-25) 100 UNIT/ML Kwikpen INJECT SUBCUTANEOUSLY 50  UNITS AND 120 UNITS IN THE  EVENING PREMEALS  . Insulin Pen Needle 32G X 6 MM MISC Use daily with insulin pen  . Lancets (FREESTYLE) lancets 1 each by Other route 2 (two) times daily. DX 250.02  . losartan (COZAAR) 50 MG tablet TAKE 1 TABLET BY MOUTH  DAILY  . NONFORMULARY OR COMPOUNDED ITEM [facility]:  Wart Cream - Cimetidine 2%, Deoxy-D-Glucose 0.2%, Fluorouracil-5 5%, Salicylic Acid, apply to the affected area 2 times daily.  .Glory RosebushVERIO test strip USE TO CHECK BLOOD SUGAR  3  TIMES DAILY  . tamsulosin (FLOMAX) 0.4 MG CAPS capsule TAKE 1 CAPSULE BY MOUTH  DAILY  . traMADol (ULTRAM) 50 MG tablet TAKE 1 TABLET BY MOUTH  EVERY 6 HOURS AS NEEDED FOR MODERATE PAIN FOR SPINAL  STENOSIS.   No facility-administered encounter medications on file as of 01/03/2020.    Allergies (verified) Hctz [hydrochlorothiazide], Metformin and related, and Statins   History: Past Medical History:  Diagnosis Date  . Diabetes mellitus   . Hyperlipidemia   . Hypertension   . Normal cardiac stress test 2012   [facility]   Past Surgical History:  Procedure Laterality Date  . LUMBAR DISC SURGERY     Family History  Problem Relation Age of Onset  . Coronary artery disease Mother 561 . COPD Mother   . Heart disease Mother   . Hyperlipidemia Mother   . Hypertension Mother   . Cancer Father   . Hyperlipidemia Father   . Hypertension Brother   . Cancer Brother        lymphoma  . Diabetes Maternal Aunt   . Diabetes Maternal Uncle   . Diabetes Maternal Grandmother   . Heart disease Maternal Grandmother    Social History   Socioeconomic History  . Marital status: Married    Spouse name: Not on file  . Number of children: Not on file  . Years of education: Not on file  . Highest  education level: Not on file  Occupational History  . Not on file  Tobacco Use  . Smoking status: Never Smoker  . Smokeless tobacco: Never Used  Substance and Sexual Activity  . Alcohol use: No  . Drug use: No  . Sexual activity: Not on file  Other Topics Concern  . Not on file  Social History Narrative  . Not on file   Social Determinants of Health   Financial Resource Strain:   . Difficulty of Paying Living Expenses:   Food Insecurity:   . Worried About Charity fundraiser in the Last Year:   . Arboriculturist in the Last Year:   Transportation Needs:   . Film/video editor (Medical):   [name] Lack of Transportation (Non-Medical):   Physical Activity:   . Days of Exercise per Week:     . Minutes of Exercise per Session:   Stress:   . Feeling of Stress :   Social Connections:   . Frequency of Communication with Friends and Family:   . Frequency of Social Gatherings with Friends and Family:   . Attends Religious Services:   . Active Member of Clubs or Organizations:   . Attends Archivist Meetings:   [name] Marital Status:     Tobacco Counseling Counseling given: Not Answered   Clinical Intake:  Pre-visit preparation completed: Yes        Diabetes: Yes (Followed by pcp)  How often do you need to have someone help you when you read instructions, pamphlets, or other written materials from your doctor or pharmacy?: 1 - Never Interpreter Needed?: No      Activities of Daily Living In your present state of health, do you have any difficulty performing the following activities: 01/03/2020  Hearing? N  Vision? N  Difficulty concentrating or making decisions? N  Walking or climbing stairs? N  Dressing or bathing? N  Doing errands, shopping? N  Preparing Food and eating ? N  Using the Toilet? N  In the past six months, have you accidently leaked urine? N  Do you have problems with loss of bowel control? N  Managing your Medications? N  Managing your Finances? Y  Comment Wife manages  Housekeeping or managing your Housekeeping? N  Some recent data might be hidden    Patient Care Team: [name], MD as PCP - General (Internal Medicine)  Indicate any recent Medical Services you may have received from other than [facility] providers in the past year (date may be approximate).     Assessment:   This is a routine wellness examination for Connor Ross.  I connected with [name] today by telephone and verified that I am speaking with the correct person using two identifiers. Location patient: home Location provider: work Persons participating in the virtual visit: patient, Marine scientist.    I discussed the limitations, risks, security and privacy concerns of  performing an evaluation and management service by telephone and the availability of in person appointments. I also discussed with the patient that there may be a patient responsible charge related to this service. The patient expressed understanding and verbally consented to this telephonic visit.    Interactive audio and video telecommunications were attempted between this provider and patient, however failed, due to patient having technical difficulties OR patient did not have access to video capability.  We continued and completed visit with audio only.  Some vital signs may be absent or patient reported.   Hearing/Vision screen  Hearing Screening   125Hz  250Hz  500Hz  1000Hz  2000Hz  3000Hz  4000Hz  6000Hz  8000Hz   Right ear:           Left ear:           Comments: Patient is able to hear conversational tones without difficulty.  No issues reported.   Vision Screening Comments: Followed by Dr. Ellin Mayhew Wears corrective lenses Visual acuity not assessed, virtual visit.  They have seen their ophthalmologist in the last 12 months.   Dietary issues and exercise activities discussed: Current Exercise Habits: The patient does not participate in regular exercise at present, Intensity: Mild  Goals    . Increase physical activity     Healthy diet  Stay hydrated Stay active      Depression Screen PHQ 2/9 Scores 01/03/2020 04/23/2019 03/12/2017 07/27/2014  PHQ - 2 Score 0 1 0 0  PHQ- 9 Score - 3 1 -    Fall Risk Fall Risk  01/03/2020 04/23/2019 07/27/2014  Falls in the past year? 1 1 Yes  Number falls in past yr: 0 0 2 or more  Comment Slipped on the wet grass about 2 months ago. - -  Injury with Fall? 0 0 -  Risk for fall due to : - - Impaired balance/gait  Follow up Falls evaluation completed Falls evaluation completed -   Any stairs in or around the home? No  If so, are there any without handrails? No  Home free of loose throw rugs in walkways, pet beds, electrical cords, etc? Yes    Adequate lighting in your home to reduce risk of falls? Yes   ASSISTIVE DEVICES UTILIZED TO PREVENT FALLS:  Life alert? No  Use of a cane, walker or w/c? Yes  Grab bars in the bathroom? Yes  Shower chair or bench in shower? No Elevated toilet seat or a handicapped toilet? No  TIMED UP AND GO:  Was the test performed? No .   Cognitive Function:     6CIT Screen 01/03/2020  What Year? 0 points  What month? 0 points  Months in reverse 0 points  Repeat phrase 0 points    Immunizations Immunization History  Administered Date(s) Administered  . Tdap 10/16/2011   Pneumococcal vaccine status: Declined,  Education has been provided regarding the importance of this vaccine but patient still declined. Advised may receive this vaccine at local pharmacy or Health Dept. Aware to provide a copy of the vaccination record if obtained from local pharmacy or Health Dept. Verbalized acceptance and understanding.   Patient declined.   Covid-19 vaccine status: Declined, Education has been provided regarding the importance of this vaccine but patient still declined. Advised may receive this vaccine at local pharmacy or Health Dept.or vaccine clinic. Aware to provide a copy of the vaccination record if obtained from local pharmacy or Health Dept. Verbalized acceptance and understanding.  Patient declined.   Qualifies for Shingles Vaccine? Yes   Zostavax/Shingrix completed No . Declined.    Health Maintenance Health Maintenance  Topic Date Due  . HEMOGLOBIN A1C  10/18/2019  . FOOT EXAM  01/05/2020 (Originally 03/12/2018)  . COVID-19 Vaccine (1) 01/19/2020 (Originally 03/06/1976)  . PNEUMOCOCCAL POLYSACCHARIDE VACCINE AGE 2-64 HIGH RISK  01/02/2021 (Originally 03/06/1966)  . HIV Screening  01/02/2021 (Originally 03/07/1979)  . OPHTHALMOLOGY EXAM  06/28/2020  . Fecal DNA (Cologuard)  04/13/2021  . TETANUS/TDAP  10/15/2021  . Hepatitis C Screening  Completed  . [place] 04/15/18 Foot exam-  denies changes in feet. Deferred until next visit with pcp 01/05/20.   HIV Screening- labs followed by pcp. Uncertain of insurance coverage for the specific lab. Deferred per patient preference.   A1C- 04/19/19 (7.6)  Lung Cancer Screening: (Low Dose CT Chest recommended if Age 35-80 years, 30 pack-year currently smoking OR have quit w/in 15years.) does not qualify.   Hepatitis C Screening: does not qualify.  Vision Screening: Recommended annual ophthalmology exams for early detection of glaucoma and other disorders of the eye. Is the patient up to date with their annual eye exam?  Yes   Dental Screening: Recommended annual dental exams for proper oral hygiene.  Community Resource Referral / Chronic Care Management: CRR required this visit?  No   CCM required this visit?  No      Plan:   Keep all routine maintenance appointments.   Follow up 01/05/20 @ 9:00  I have personally reviewed and noted the following in the patient's chart:   . Medical and social history . Use of alcohol, tobacco or illicit drugs  . Current medications and supplements . Functional ability and status . Nutritional status . Physical activity . Advanced directives . List of other physicians . Hospitalizations, surgeries, and ER visits in previous 12 months . Vitals . Screenings to include cognitive, depression, and falls . Referrals and appointments  In addition, I have reviewed and discussed with patient certain preventive protocols, quality metrics, and best practice recommendations. A written personalized care plan for preventive services as well as general preventive health recommendations were provided to patient.     OBrien-Blaney, Olyn Landstrom L, LPN   4/49/7530     I have reviewed the above information and agree with above.   Deborra Medina, MD

## 2020-01-04 ENCOUNTER — Other Ambulatory Visit: Payer: Self-pay | Admitting: Internal Medicine

## 2020-01-04 DIAGNOSIS — E1165 Type 2 diabetes mellitus with hyperglycemia: Secondary | ICD-10-CM

## 2020-01-05 ENCOUNTER — Encounter: Payer: Self-pay | Admitting: Internal Medicine

## 2020-01-05 ENCOUNTER — Ambulatory Visit (INDEPENDENT_AMBULATORY_CARE_PROVIDER_SITE_OTHER): Payer: 59 | Admitting: Internal Medicine

## 2020-01-05 ENCOUNTER — Ambulatory Visit (INDEPENDENT_AMBULATORY_CARE_PROVIDER_SITE_OTHER): Payer: Medicare Other

## 2020-01-05 ENCOUNTER — Ambulatory Visit: Payer: Medicare Other

## 2020-01-05 ENCOUNTER — Other Ambulatory Visit: Payer: Self-pay

## 2020-01-05 VITALS — BP 140/72 | HR 89 | Temp 97.0°F | Resp 16 | Ht 71.0 in | Wt 327.0 lb

## 2020-01-05 DIAGNOSIS — E1165 Type 2 diabetes mellitus with hyperglycemia: Secondary | ICD-10-CM | POA: Diagnosis not present

## 2020-01-05 DIAGNOSIS — E785 Hyperlipidemia, unspecified: Secondary | ICD-10-CM

## 2020-01-05 DIAGNOSIS — M546 Pain in thoracic spine: Secondary | ICD-10-CM

## 2020-01-05 DIAGNOSIS — E11621 Type 2 diabetes mellitus with foot ulcer: Secondary | ICD-10-CM

## 2020-01-05 DIAGNOSIS — E1129 Type 2 diabetes mellitus with other diabetic kidney complication: Secondary | ICD-10-CM | POA: Diagnosis not present

## 2020-01-05 DIAGNOSIS — Z6841 Body Mass Index (BMI) 40.0 and over, adult: Secondary | ICD-10-CM

## 2020-01-05 DIAGNOSIS — M545 Low back pain, unspecified: Secondary | ICD-10-CM

## 2020-01-05 DIAGNOSIS — R809 Proteinuria, unspecified: Secondary | ICD-10-CM | POA: Diagnosis not present

## 2020-01-05 DIAGNOSIS — C61 Malignant neoplasm of prostate: Secondary | ICD-10-CM

## 2020-01-05 DIAGNOSIS — M791 Myalgia, unspecified site: Secondary | ICD-10-CM

## 2020-01-05 DIAGNOSIS — L97521 Non-pressure chronic ulcer of other part of left foot limited to breakdown of skin: Secondary | ICD-10-CM

## 2020-01-05 DIAGNOSIS — T466X5A Adverse effect of antihyperlipidemic and antiarteriosclerotic drugs, initial encounter: Secondary | ICD-10-CM

## 2020-01-05 LAB — COMPREHENSIVE METABOLIC PANEL
ALT: 19 U/L (ref 0–53)
AST: 12 U/L (ref 0–37)
Albumin: 4.7 g/dL (ref 3.5–5.2)
Alkaline Phosphatase: 68 U/L (ref 39–117)
BUN: 18 mg/dL (ref 6–23)
CO2: 26 mEq/L (ref 19–32)
Calcium: 9.7 mg/dL (ref 8.4–10.5)
Chloride: 102 mEq/L (ref 96–112)
Creatinine, Ser: 0.7 mg/dL (ref 0.40–1.50)
GFR: 116.73 mL/min (ref >60.00)
Glucose, Bld: 113 mg/dL — ABNORMAL HIGH (ref 70–99)
Potassium: 3.7 mEq/L (ref 3.5–5.1)
Sodium: 136 mEq/L (ref 135–145)
Total Bilirubin: 0.4 mg/dL (ref 0.2–1.2)
Total Protein: 7.5 g/dL (ref 6.0–8.3)

## 2020-01-05 LAB — HEMOGLOBIN A1C: Hgb A1c MFr Bld: 8.3 % — ABNORMAL HIGH (ref 4.6–6.5)

## 2020-01-05 LAB — MICROALBUMIN / CREATININE URINE RATIO
Creatinine,U: 27.4 mg/dL
Microalb Creat Ratio: 10.3 mg/g (ref 0.0–30.0)
Microalb, Ur: 2.8 mg/dL — ABNORMAL HIGH (ref 0.0–1.9)

## 2020-01-05 LAB — SEDIMENTATION RATE: Sed Rate: 33 mm/hr — ABNORMAL HIGH (ref 0–20)

## 2020-01-05 MED ORDER — CYCLOBENZAPRINE HCL 10 MG PO TABS: 10.0000 mg | ORAL_TABLET | Freq: Three times a day (TID) | ORAL | 0 refills | Status: DC | PRN

## 2020-01-05 MED ORDER — HYDROCODONE-ACETAMINOPHEN 10-325 MG PO TABS: 1.0000 | ORAL_TABLET | Freq: Four times a day (QID) | ORAL | 0 refills | Status: DC | PRN

## 2020-01-05 NOTE — Patient Instructions (Signed)
Suspend the tramadol during nighttime and use the vicodin as needed  Take the muscle relaxer every 8 hours    Try using  A heating pad for 15 minutes every few hours     For your diabetes  Check a pre and post meal sugar (2 hours after eating) daily for the next week  Catie U[facility] referral in progress for help in getting the Free Style libre monitor for help in managing your diabetes

## 2020-01-05 NOTE — Progress Notes (Signed)
Subjective:  Patient ID: Connor Ross, male    DOB: [birth year]  Age: 54 y.o. MRN: [id number]  CC: The primary encounter diagnosis was Acute left-sided thoracic back pain. Diagnoses of Microalbuminuria due to type 2 diabetes mellitus ([place]), Uncontrolled type 2 diabetes mellitus with hyperglycemia ([place]), Acute left-sided low back pain without sciatica, Prostate cancer ([place]), 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 type 2 diabetes mellitus, limited to breakdown of skin ([place]), Morbid obesity with BMI of 45.0-49.9, adult ([place]), Myalgia due to statin, and Hyperlipidemia with target LDL less than 70 were also pertinent to this visit.  HPI IZICK GASBARRO presents for follow up on multiple chronic issues   This visit occurred during the SARS-CoV-2 public health emergency.  Safety protocols were in place, including screening questions prior to the visit, additional usage of staff PPE, and extensive cleaning of exam room while observing appropriate contact time as indicated for disinfecting solutions.   Patient has received NO  doses of the available COVID 19 vaccines and is not interested in getting them. .   Patient continues to mask when outside of the home except when walking in yard or at safe distances from others .  Patient denies any change in mood or development of unhealthy behaviors resuting from the pandemic's restriction of activities and socialization.    Cc: Left sided back and side  pain for the last 3 days .  Worried that the Januvia has hurt his kidney.  Has had left sided back pain for one year, but pain is now worse and he cannot find a comfortable sleeping position. A few months ago developed recurrent left side pain with standing and bending over .  For the last  3 DAYS HAS HAD TO SLEEP SITTING UP because lying down hurts   Obesity;  Has gained over 10 lbs.  Stress eating  due to wife's recent heart transplant and loss of employment.   Type 2 DM  With  neuropathy.  HAD SURGERY of right foot in [facility], right 5th MT exostectomy to remove a large fracture callous from remote fracture.  Pressure ulcer finally healed.  He has a non healing callous on his right great toe, but does not want to see podiatry again because the cream Dr Milinda Pointer gave him to use to remove a plantar wart (surgery was C/I due to A1c > 10) created the ulcer  On his great toe that became infected and required treatment by wound center.  Still has callous on toe which he shaves periodically.    Diabetes is "out of control."  Using 75/25,  50 and 100 units  At lunch and dinner respectively.  Did not bring log of sugars .  Reports that his fasting sugars was  172 this morning .  Only checks fastings,  Once in a while at lunch  Or dinner.   Dinner last night was a stromboli. Not interested in the [facility] monitoring system because "I don't want something attached to my arm."  Had COVID infection  IN [facility] [place] April.  Recovered at home, did not receive the monoclonal ab infusion   Outpatient Medications Prior to Visit  Medication Sig Dispense Refill  . ASPIRIN LOW DOSE 81 MG EC tablet TAKE 1 TABLET BY MOUTH  DAILY (SWALLOW WHOLE) 90 tablet 1  . blood glucose meter kit and supplies Dispense based on patient and insurance preference. Use up to four times daily as directed. (FOR ICD-10 E10.9,  E11.9). Use to check blood sugar three times daily 1 each 0  . empagliflozin (JARDIANCE) 25 MG TABS tablet Take 25 mg by mouth daily. 90 tablet 3  . furosemide (LASIX) 20 MG tablet TAKE 1 TABLET BY MOUTH  EVERY OTHER DAY 45 tablet 3  . gabapentin (NEURONTIN) 100 MG capsule TAKE 1 CAPSULE BY MOUTH 3  TIMES DAILY 270 capsule 3  . glipiZIDE (GLUCOTROL) 10 MG tablet TAKE 1 TABLET BY MOUTH  TWICE DAILY BEFORE MEALS 180 tablet 3  . Insulin Lispro Prot & Lispro (HUMALOG MIX 75/25 KWIKPEN) (75-25) 100 UNIT/ML Kwikpen INJECT SUBCUTANEOUSLY 50  UNITS IN THE MORNING AND  120 UNITS IN THE  EVENING  PREMEALS 165 mL 3  . Insulin Pen Needle 32G X 6 MM MISC Use daily with insulin pen 50 each 0  . Lancets (FREESTYLE) lancets 1 each by Other route 2 (two) times daily. DX 250.02 100 each 12  . losartan (COZAAR) 50 MG tablet TAKE 1 TABLET BY MOUTH  DAILY 90 tablet 3  . NONFORMULARY OR COMPOUNDED ITEM [facility]:  Wart Cream - Cimetidine 2%, Deoxy-D-Glucose 0.2%, Fluorouracil-5 5%, Salicylic Acid, apply to the affected area 2 times daily. 120 each 1  . ONETOUCH VERIO test strip USE TO CHECK BLOOD SUGAR 3  TIMES DAILY 300 strip 3  . tamsulosin (FLOMAX) 0.4 MG CAPS capsule TAKE 1 CAPSULE BY MOUTH  DAILY 90 capsule 3  . traMADol (ULTRAM) 50 MG tablet TAKE 1 TABLET BY MOUTH  EVERY 6 HOURS AS NEEDED FOR MODERATE PAIN FOR SPINAL  STENOSIS. 90 tablet 2   No facility-administered medications prior to visit.    Review of Systems;  Patient denies headache, fevers, malaise, unintentional weight loss, skin rash, eye pain, sinus congestion and sinus pain, sore throat, dysphagia,  hemoptysis , cough, dyspnea, wheezing, chest pain, palpitations, orthopnea, edema, abdominal pain, nausea, melena, diarrhea, constipation, flank pain, dysuria, hematuria, urinary  Frequency, nocturia, numbness, tingling, seizures,  Focal weakness, Loss of consciousness,  Tremor, insomnia, depression, anxiety, and suicidal ideation.      Objective:  BP 140/72 (BP Location: Left Arm, Patient Position: Sitting, Cuff Size: Normal)   Pulse 89   Temp (!) 97 F (36.1 C) (Temporal)   Resp 16   Ht 5' 11" (1.803 m)   Wt (!) 327 lb (148.3 kg)   SpO2 97%   BMI 45.61 kg/m   BP Readings from Last 3 Encounters:  01/05/20 140/72  04/23/19 122/74  12/04/18 122/66    Wt Readings from Last 3 Encounters:  01/05/20 (!) 327 lb (148.3 kg)  01/03/20 (!) 313 lb (142 kg)  04/23/19 (!) 313 lb 9.6 oz (142.2 kg)    General appearance: alert, cooperative and appears stated age Ears: normal TM's and external ear canals both  ears Throat: lips, mucosa, and tongue normal; teeth and gums normal Neck: no adenopathy, no carotid bruit, supple, symmetrical, trachea midline and thyroid not enlarged, symmetric, no tenderness/mass/nodules Back: symmetric, no curvature. ROM normal. No CVA tenderness. Lungs: clear to auscultation bilaterally Heart: regular rate and rhythm, S1, S2 normal, no murmur, click, rub or gallop Abdomen: soft, non-tender; bowel sounds normal; no masses,  no organomegaly Pulses: 2+ and symmetric Skin: Skin color, texture, turgor normal. No rashes or lesions Lymph nodes: Cervical, supraclavicular, and axillary nodes normal.  Lab Results  Component Value Date   HGBA1C 8.3 (H) 01/05/2020   HGBA1C 7.6 (H) 04/19/2019   HGBA1C 8.0 (H) 12/03/2018    Lab Results  Component Value  Date   CREATININE 0.70 01/05/2020   CREATININE 0.79 04/19/2019   CREATININE 0.89 12/03/2018    Lab Results  Component Value Date   WBC 7.8 04/19/2019   HGB 14.9 04/19/2019   HCT 44.9 04/19/2019   PLT 259.0 04/19/2019   GLUCOSE 113 (H) 01/05/2020   CHOL 225 (H) 04/19/2019   TRIG 136.0 04/19/2019   HDL 25.30 (L) 04/19/2019   LDLDIRECT 151.0 12/03/2018   LDLCALC 173 (H) 04/19/2019   ALT 19 01/05/2020   AST 12 01/05/2020   NA 136 01/05/2020   K 3.7 01/05/2020   CL 102 01/05/2020   CREATININE 0.70 01/05/2020   BUN 18 01/05/2020   CO2 26 01/05/2020   TSH 2.43 04/19/2019   PSA 0.45 03/25/2018   HGBA1C 8.3 (H) 01/05/2020   MICROALBUR 2.8 (H) 01/05/2020    DG Foot Complete Left  Result Date: 04/29/2019 CLINICAL DATA:  54 year old male with a history of nonhealing wound EXAM: LEFT FOOT - COMPLETE 3+ VIEW COMPARISON:  04/15/2018, FINDINGS: No acute displaced fracture. Degenerative changes of the tibiotalar joint, subtalar joint, enthesopathic changes of Achilles insertion and plantar fascia insertion. Degenerative changes of the midfoot. Valgus deformity of the 2-4 metatarsophalangeal joints, with no dislocation.  Degenerative changes of the interphalangeal joints. No erosive changes of the distal phalanx of the great toe. There is lucency associated with the dorsal aspect of the distal great toe with no radiopaque foreign body. IMPRESSION: No acute bony abnormality, with no erosive changes of the great toe. Soft tissue changes on the dorsum of the great toe compatible with given history Degenerative changes of the foot Electronically Signed   By: Corrie Mckusick D.O.   On: 04/29/2019 08:38    Assessment & Plan:   Problem List Items Addressed This Visit      Unprioritized   Myalgia due to statin    #ASCVD Risk- Diabetes, 10 year ASCVD risk 23%. Patient unsure of which statin medication caused joint pain. Would like to start high intensity statin, but patient is unable to afford the ~$50 copay through his insurance or the $15 [facility] discount plan cost for atorvastatin.      Morbid obesity with BMI of 45.0-49.9, adult ([place])    Complicated by uncontrolled diabetes,  Hypertension,  OSA and depression.  Aggravated by financial hardship due to disability and now wife's unemployed status. He has been stress eating and does not exercise due to chronic back pain . I have addressed  BMI and recommended wt loss of 10% of body weight over the next 6 months using a low fat, fruit/vegetable based [place] diet and regular exercise a minimum of 5 days per week.        Microalbuminuria due to type 2 diabetes mellitus ([place])    Continue losartan Lab Results  Component Value Date   MICROALBUR 2.8 (H) 01/05/2020   MICROALBUR 2.6 (H) 12/03/2018           Relevant Orders   Microalbumin / creatinine urine ratio (Completed)   Hyperlipidemia with target LDL less than 70    Untreated despite high risk of ASCVD due to prior statin induced joint pain and financial hardship  Lab Results  Component Value Date   CHOL 225 (H) 04/19/2019   HDL 25.30 (L) 04/19/2019   LDLCALC 173 (H) 04/19/2019   LDLDIRECT 151.0  12/03/2018   TRIG 136.0 04/19/2019   CHOLHDL 9 04/19/2019         DM (diabetes mellitus), type 2, uncontrolled ([place])  Worsening control. Referring to [name] for CCM and strongly  Advised to accept the freestyle libre monitoring system to improve monitoring methods   Lab Results  Component Value Date   HGBA1C 8.3 (H) 01/05/2020   Lab Results  Component Value Date   MICROALBUR 2.8 (H) 01/05/2020   MICROALBUR 2.6 (H) 12/03/2018           Relevant Orders   Hemoglobin A1c (Completed)   Comprehensive metabolic panel (Completed)   Ambulatory referral to Chronic Care Management Services   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 type 2 diabetes mellitus, limited to breakdown of skin ([place])    He has a callous on distal edge of great toe.       Acute left-sided low back pain    He refused thoracolumbar spine films, because he insisted that his kidney was the problem.  KUB done; normal appearing kidney. Progression of lumbar disk disease noted on film.  Weight loss flexeril and vicodin prescribed for short term use.       Relevant Medications   cyclobenzaprine (FLEXERIL) 10 MG tablet   HYDROcodone-acetaminophen (NORCO) 10-325 MG tablet    Other Visit Diagnoses    Acute left-sided thoracic back pain    -  Primary   Relevant Medications   cyclobenzaprine (FLEXERIL) 10 MG tablet   HYDROcodone-acetaminophen (NORCO) 10-325 MG tablet   Other Relevant Orders   Urinalysis, Routine w reflex microscopic   Sedimentation rate (Completed)   DG Abd 1 View (Completed)   Urinalysis, Routine w reflex microscopic   Prostate cancer ([place])          I am having [name] "Ken" start on cyclobenzaprine and HYDROcodone-acetaminophen. I am also having him maintain his freestyle, Insulin Pen Needle, NONFORMULARY OR COMPOUNDED ITEM, blood glucose meter kit and supplies, Jardiance, OneTouch Verio, losartan, glipiZIDE, gabapentin, traMADol, furosemide, Aspirin Low Dose,  tamsulosin, and Insulin Lispro Prot & Lispro.  Meds ordered this encounter  Medications  . cyclobenzaprine (FLEXERIL) 10 MG tablet    Sig: Take 1 tablet (10 mg total) by mouth 3 (three) times daily as needed for muscle spasms.    Dispense:  30 tablet    Refill:  0  . HYDROcodone-acetaminophen (NORCO) 10-325 MG tablet    Sig: Take 1 tablet by mouth every 6 (six) hours as needed for severe pain.    Dispense:  30 tablet    Refill:  0    There are no discontinued medications.  Follow-up: No follow-ups on file.   [name], MD

## 2020-01-06 ENCOUNTER — Telehealth: Payer: Self-pay | Admitting: Pharmacist

## 2020-01-06 ENCOUNTER — Ambulatory Visit: Payer: Medicare Other | Admitting: Pharmacist

## 2020-01-06 ENCOUNTER — Encounter: Payer: Self-pay | Admitting: Internal Medicine

## 2020-01-06 DIAGNOSIS — E1165 Type 2 diabetes mellitus with hyperglycemia: Secondary | ICD-10-CM

## 2020-01-06 DIAGNOSIS — M545 Low back pain, unspecified: Secondary | ICD-10-CM | POA: Insufficient documentation

## 2020-01-06 NOTE — Assessment & Plan Note (Signed)
#  ASCVD Risk- Diabetes, 10 year ASCVD risk 23%. Patient unsure of which statin medication caused joint pain. Would like to start high intensity statin, but patient is unable to afford the ~$50 copay through his insurance or the $15 [facility] discount plan cost for atorvastatin.

## 2020-01-06 NOTE — Assessment & Plan Note (Signed)
He has a callous on distal edge of great toe.

## 2020-01-06 NOTE — Assessment & Plan Note (Signed)
Continue losartan Lab Results  Component Value Date   MICROALBUR 2.8 (H) 01/05/2020   MICROALBUR 2.6 (H) 12/03/2018

## 2020-01-06 NOTE — Assessment & Plan Note (Addendum)
Complicated by uncontrolled diabetes,  Hypertension,  OSA and depression.  Aggravated by financial hardship due to disability and now wife's unemployed status. He has been stress eating and does not exercise due to chronic back pain . I have addressed  BMI and recommended wt loss of 10% of body weight over the next 6 months using a low fat, fruit/vegetable based [place] diet and regular exercise a minimum of 5 days per week.

## 2020-01-06 NOTE — Chronic Care Management (AMB) (Signed)
Chronic Care Management   Note  01/06/2020 Name: Connor Ross MRN: [id number] DOB: 05/23/1966   Subjective:  Connor Ross is a 54 y.o. year old male who is a primary care patient of [name], [name], MD. The CCM team was consulted for assistance with chronic disease management and care coordination needs.    Contacted patient for DM management/device access referral  [name] was given information about Chronic Care Management services today including:   1. CCM service includes personalized support from designated clinical staff supervised by his physician, including individualized plan of care and coordination with other care providers 2. 24/7 contact phone numbers for assistance for urgent and routine care needs. 3. Service will only be billed when office clinical staff spend 20 minutes or more in a month to coordinate care.   Review of patient status, including review of consultants reports, laboratory and other test data, was performed as part of comprehensive evaluation and provision of chronic care management services.   SDOH (Social Determinants of Health) assessments and interventions performed:  SDOH Interventions     Most Recent Value  SDOH Interventions  SDOH Interventions for the Following Domains Financial Strain  Financial Strain Interventions Other (Comment)  [medication assistance/access]     yes  Objective:  Lab Results  Component Value Date   CREATININE 0.70 01/05/2020   CREATININE 0.79 04/19/2019   CREATININE 0.89 12/03/2018    Lab Results  Component Value Date   HGBA1C 8.3 (H) 01/05/2020       Component Value Date/Time   CHOL 225 (H) 04/19/2019 0841   TRIG 136.0 04/19/2019 0841   HDL 25.30 (L) 04/19/2019 0841   CHOLHDL 9 04/19/2019 0841   VLDL 27.2 04/19/2019 0841   LDLCALC 173 (H) 04/19/2019 0841   LDLDIRECT 151.0 12/03/2018 0857    Clinical ASCVD: No  The 10-year ASCVD risk score Mikey Bussing DC Jr., et al., 2013) is:  27.8%   Values used to calculate the score:     Age: 55 years     Sex: Male     Is Non-Hispanic African American: No     Diabetic: Yes     Tobacco smoker: No     Systolic Blood Pressure: 299 mmHg     Is BP treated: Yes     HDL Cholesterol: 25.3 mg/dL     Total Cholesterol: 225 mg/dL    BP Readings from Last 3 Encounters:  01/05/20 140/72  04/23/19 122/74  12/04/18 122/66    Allergies  Allergen Reactions  . Hctz [Hydrochlorothiazide] Other (See Comments)    Extreme Cramping  . Metformin And Related Diarrhea  . Statins     Medications Reviewed Today    Reviewed by Adair Laundry, [place] (Certified Medical Assistant) on 01/05/20 at [phone]  Med List Status: <None>  Medication Order Taking? Sig Documenting Provider Last Dose Status Informant  ASPIRIN LOW DOSE 81 MG EC tablet [id number] Yes TAKE 1 TABLET BY MOUTH  DAILY (SWALLOW WHOLE) [name], MD Taking Active   blood glucose meter kit and supplies [id number] Yes Dispense based on patient and insurance preference. Use up to four times daily as directed. (FOR ICD-10 E10.9, E11.9). Use to check blood sugar three times daily [name], MD Taking Active   empagliflozin (JARDIANCE) 25 MG TABS tablet [id number] Yes Take 25 mg by mouth daily. [name], MD Taking Active   furosemide (LASIX) 20 MG tablet [id number] Yes TAKE 1 TABLET BY MOUTH  EVERY OTHER DAY [name],  [name], MD Taking Active   gabapentin (NEURONTIN) 100 MG capsule [id number] Yes TAKE 1 CAPSULE BY MOUTH 3  TIMES DAILY [name], MD Taking Active   glipiZIDE (GLUCOTROL) 10 MG tablet [id number] Yes TAKE 1 TABLET BY MOUTH  TWICE DAILY BEFORE MEALS [name], MD Taking Active   Insulin Lispro Prot & Lispro (HUMALOG MIX 75/25 KWIKPEN) (75-25) 100 UNIT/ML Claiborne Rigg [id number] Yes INJECT SUBCUTANEOUSLY 50  UNITS IN THE MORNING AND  120 UNITS IN THE EVENING  PREMEALS [name], MD Taking Active   Insulin Pen Needle 32G X 6 MM MISC [id number] Yes Use daily with  insulin pen [name], MD Taking Active   Lancets (FREESTYLE) lancets [id number] Yes 1 each by Other route 2 (two) times daily. DX 250.02 [name], MD Taking Active   losartan (COZAAR) 50 MG tablet [id number] Yes TAKE 1 TABLET BY MOUTH  DAILY [name], MD Taking Active   NONFORMULARY OR COMPOUNDED [name] [id number] Yes [facility]:  Wart Cream - Cimetidine 2%, Deoxy-D-Glucose 0.2%, Fluorouracil-5 5%, Salicylic Acid, apply to the affected area 2 times daily. [place], [place] T, [place] Taking Active   [name] VERIO test strip [id number] Yes USE TO CHECK BLOOD SUGAR 3  TIMES DAILY [name], MD Taking Active   tamsulosin (FLOMAX) 0.4 MG CAPS capsule [id number] Yes TAKE 1 CAPSULE BY MOUTH  DAILY [name], MD Taking Active   traMADol (ULTRAM) 50 MG tablet [id number] Yes TAKE 1 TABLET BY MOUTH  EVERY 6 HOURS AS NEEDED FOR MODERATE PAIN FOR SPINAL  STENOSIS. [name], MD Taking Active            Assessment:   Goals Addressed              This Visit's Progress     Patient Stated   .  [name] "I am worried about costs on Medicare" (pt-stated)        CARE PLAN ENTRY (see longitudinal plan of care for additional care plan information)  Current Barriers:  . Diabetes: uncontrolled, complicated by chronic medical conditions including HTN, HLD, OA, most recent A1c 8.3%.  . Reports significant stressors right now d/t health concerns w/ his wife. He will flip to Medicare drug coverage (reports a [employer] plan) starting in July. Notes his wife has COBRA and disability payments right now, but applied for Social Security disability  . Previously unable to access GLP1 d/t high deductible. Referred for consideration of CGM . Most recent eGFR: >100 . Current antihyperglycemic regimen: Humalog 75/25 50 units QAM, 120 units QPM; glipizide 10 mg BID, Jardiance 25 mg daily . Cardiovascular risk reduction: o Current hypertensive regimen: losartan 50 mg daily, furosemide 20 mg  daily o Current hyperlipidemia regimen: none, ASCVD risk 27.8%, reported hx statin intolerance, but name of medication tried unknown o Current antiplatelet regimen: ASA 81 mg daily  . Neuropathy: gabapentin 100 mg TID  Pharmacist Clinical Goal(s):  [name] Over the next 90 days, patient will work with [name] and primary care provider to address optimized medication management  Interventions: . Comprehensive medication review performed, medication list updated in electronic medical record . Inter-disciplinary care team collaboration (see longitudinal plan of care) . Discussed CGM. Patient is unsure if he wants to have something "stuck on my arm". Reviewed device, application, duration, benefits. Patient has commercial insurance right now, so CGM would need to be sent to local pharmacy. Will flip to Medicare next month, and CGM would need to  be sent to DME company. Due to stress w/ his wife's health and upcoming transition, patient is not interested in trying new technology at this time. Will revisit in the future . Discussed medication costs principals on Medicare.  . Reviewed availability of patient assistance programs. Patient will qualify for Humalog (and eventually Trulicity) patient assistance through [employer], once he starts on Medicare coverage next month. Discussed process. Will mail patient information about patient assistance, requirements.  . Over income for SGLT2 assistance based on income, but will see what copay is to see if he would qualify for appeals. Will prepare sample of Jardiance 25 mg tablets today to tide patient over until we can see next steps. If his wife switches from work-based disability to Social Security disability, income will drop, and they will likely qualify for SGLT2 assistance programs.  . As noted above, will plan to broach addition of GLP1 to reduce insulin burden, target weight loss . Will address re-trial of statin therapy or other appropriate ASCVD primary  prevention therapy.   Patient Self Care Activities:  . Patient will check blood glucose daily , document, and provide at future appointments . Patient will take medications as prescribed . Patient will report any questions or concerns to provider   Initial goal documentation        Plan: - Scheduled f/u call in ~ 2 weeks  [name], [name], [place], [place] Pharmacist [place] [phone]

## 2020-01-06 NOTE — Patient Instructions (Addendum)
Connor Ross,   It was great talking with you today. We discussed some medication access things.   1) FreeStyle Libre Continuous Glucose monitor. Rules for insurance covering this device will change when you flip to Medicare coverage. Let's wait and see what happens as you move through this transition.   2) Humalog cost - [employer], the Humalog manufacturer, has a program where you can apply to get the medication for free if your total household income is <$69,680 for a 2 person household. They will ship the medication directly to your house. They will require 1) a completed application (I have a pharmacy technician, Jill, that helps with this paperwork) 2) a copy of your new ([employer] Medicare) insurance card and 3) proof of your income. This would be a copy of your Social Security awards letter and a copy of something showing what your wife receives from disability monthly.   3) Jardiance cost - [employer], the Jardiance manufacturer, has a program where you can apply to get the medication for free if your total household income is <$52,260 for a 2 person household. They will ship the medication directly to your house. They do have an appeals process that if your monthly copay x 12 is greater than 3% of your total yearly household income, you can be approved. We will evaluate the ability to get this moving forward, once we see what your copay on Medicare is. For the time being, I have prepared some samples for you. Come by the office and pick them up at your convenience.   I'll call you on July 2nd to touch base.   Thanks!  Catie Travis, [name] [phone]  Visit Information  Goals Addressed              This Visit's Progress     Patient Stated   .  [name] "I am worried about costs on Medicare" (pt-stated)        CARE PLAN ENTRY (see longitudinal plan of care for additional care plan information)  Current Barriers:  . Diabetes: uncontrolled, complicated by chronic medical conditions  including HTN, HLD, OA, most recent A1c 8.3%.  . Reports significant stressors right now d/t health concerns w/ his wife. He will flip to Medicare drug coverage (reports a [employer] plan) starting in July. Notes his wife has COBRA and disability payments right now, but applied for Social Security disability  . Previously unable to access GLP1 d/t high deductible. Referred for consideration of CGM . Most recent eGFR: >100 . Current antihyperglycemic regimen: Humalog 75/25 50 units QAM, 120 units QPM; glipizide 10 mg BID, Jardiance 25 mg daily . Cardiovascular risk reduction: o Current hypertensive regimen: losartan 50 mg daily, furosemide 20 mg daily o Current hyperlipidemia regimen: none, ASCVD risk 27.8%, reported hx statin intolerance, but name of medication tried unknown o Current antiplatelet regimen: ASA 81 mg daily  . Neuropathy: gabapentin 100 mg TID  Pharmacist Clinical Goal(s):  . Over the next 90 days, patient will work with [name] and primary care provider to address optimized medication management  Interventions: . Comprehensive medication review performed, medication list updated in electronic medical record . Inter-disciplinary care team collaboration (see longitudinal plan of care) . Discussed CGM. Patient is unsure if he wants to have something "stuck on my arm". Reviewed device, application, duration, benefits. Patient has commercial insurance right now, so CGM would need to be sent to local pharmacy. Will flip to Medicare next month, and CGM would need to be sent to DME company. Due   to stress w/ his wife's health and upcoming transition, patient is not interested in trying new technology at this time. Will revisit in the future . Discussed medication costs principals on Medicare.  . Reviewed availability of patient assistance programs. Patient will qualify for Humalog (and eventually Trulicity) patient assistance through [employer], once he starts on Medicare coverage next month.  Discussed process. Will mail patient information about patient assistance, requirements.  . Over income for SGLT2 assistance based on income, but will see what copay is to see if he would qualify for appeals. Will prepare sample of Jardiance 25 mg tablets today to tide patient over until we can see next steps. If his wife switches from work-based disability to Social Security disability, income will drop, and they will likely qualify for SGLT2 assistance programs.  . As noted above, will plan to broach addition of GLP1 to reduce insulin burden, target weight loss . Will address re-trial of statin therapy or other appropriate ASCVD primary prevention therapy.   Patient Self Care Activities:  . Patient will check blood glucose daily , document, and provide at future appointments . Patient will take medications as prescribed . Patient will report any questions or concerns to provider   Initial goal documentation        The patient verbalized understanding of instructions provided today and agreed to receive a mailed copy of patient instruction and/or educational materials.   Plan: - Scheduled f/u call in ~ 2 weeks  Catie Travis, [name], BCACP, CPP Clinical Pharmacist [facility] [place] [phone] 

## 2020-01-06 NOTE — Telephone Encounter (Signed)
Medication Samples have been pulled, logged, and labeled for the patient  Drug name: Jardiance     Strength: 25 mg        Qty: 8 boxes (56 tablets) LOT: [id number]  Exp.Date: 02/2022    Samples placed at the front desk for patient pick up

## 2020-01-06 NOTE — Assessment & Plan Note (Signed)
He refused thoracolumbar spine films, because he insisted that his kidney was the problem.  KUB done; normal appearing kidney. Progression of lumbar disk disease noted on film.  Weight loss flexeril and vicodin prescribed for short term use.

## 2020-01-06 NOTE — Assessment & Plan Note (Signed)
Untreated despite high risk of ASCVD due to prior statin induced joint pain and financial hardship  Lab Results  Component Value Date   CHOL 225 (H) 04/19/2019   HDL 25.30 (L) 04/19/2019   LDLCALC 173 (H) 04/19/2019   LDLDIRECT 151.0 12/03/2018   TRIG 136.0 04/19/2019   CHOLHDL 9 04/19/2019

## 2020-01-06 NOTE — Assessment & Plan Note (Signed)
Worsening control. Referring to Connor Ross for CCM and strongly  Advised to accept the freestyle libre monitoring system to improve monitoring methods   Lab Results  Component Value Date   HGBA1C 8.3 (H) 01/05/2020   Lab Results  Component Value Date   MICROALBUR 2.8 (H) 01/05/2020   MICROALBUR 2.6 (H) 12/03/2018

## 2020-01-12 ENCOUNTER — Telehealth: Payer: Self-pay | Admitting: Internal Medicine

## 2020-01-12 ENCOUNTER — Ambulatory Visit: Payer: Self-pay | Admitting: Pharmacist

## 2020-01-12 DIAGNOSIS — E1165 Type 2 diabetes mellitus with hyperglycemia: Secondary | ICD-10-CM

## 2020-01-12 MED ORDER — FREESTYLE LIBRE 14 DAY SENSOR MISC: 1 refills | Status: DC

## 2020-01-12 NOTE — Telephone Encounter (Signed)
Pt called to get more info on the freestyle libre.. he is unsure if he can keep his appt on 7/2

## 2020-01-12 NOTE — Patient Instructions (Signed)
Visit Information  Goals Addressed              This Visit's Progress     Patient Stated   .  [name] "I am worried about costs on Medicare" (pt-stated)        CARE PLAN ENTRY (see longitudinal plan of care for additional care plan information)  Current Barriers:  . Diabetes: uncontrolled, complicated by chronic medical conditions including HTN, HLD, OA, most recent A1c 8.3%.  o Called today noting he is interested in trialing CGM. [name] Current antihyperglycemic regimen: Humalog 75/25 50 units QAM, 120 units QPM; glipizide 10 mg BID, Jardiance 25 mg daily . Cardiovascular risk reduction: o Current hypertensive regimen: losartan 50 mg daily, furosemide 20 mg daily o Current hyperlipidemia regimen: none, ASCVD risk 27.8%, reported hx statin intolerance, but name of medication tried unknown o Current antiplatelet regimen: ASA 81 mg daily  . Neuropathy: gabapentin 100 mg TID  Pharmacist Clinical Goal(s):  [name] Over the next 90 days, patient will work with [name] and primary care provider to address optimized medication management  Interventions: . Comprehensive medication review performed, medication list updated in electronic medical record . Inter-disciplinary care team collaboration (see longitudinal plan of care) . Sent script for [employer] 14 day sensors to [place]. Patient calls me back, copay was >$200. Will utilize a sample to trial [place] for this patient, then pursue coverage through [facility] moving forward.   Patient Self Care Activities:  . Patient will check blood glucose daily , document, and provide at future appointments . Patient will take medications as prescribed . Patient will report any questions or concerns to provider   Please see past updates related to this goal by clicking on the "Past Updates" button in the selected goal         The patient verbalized understanding of instructions provided today and declined a print copy of patient instruction  materials.   Plan:  - Will outreach patient as next scheduled  [name], [name], [place], [place] Pharmacist [place] [phone]

## 2020-01-12 NOTE — Telephone Encounter (Signed)
See CCM documentation 

## 2020-01-12 NOTE — Chronic Care Management (AMB) (Signed)
Chronic Care Management   Follow Up Note   01/12/2020 Name: DARTANYON FRANKOWSKI MRN: [id number] DOB: [birth year]  Referred by: [name], MD Reason for referral : Chronic Care Management (Medication Management)   LESLEY GALENTINE is a 54 y.o. year old male who is a primary care patient of [name], [name], MD. The CCM team was consulted for assistance with chronic disease management and care coordination needs.    Received call from patient with questions about CGM.   Review of patient status, including review of consultants reports, relevant laboratory and other test results, and collaboration with appropriate care team members and the patient's provider was performed as part of comprehensive patient evaluation and provision of chronic care management services.    SDOH (Social Determinants of Health) assessments performed: No See Care Plan activities for detailed interventions related to [facility])     Outpatient Encounter Medications as of 01/12/2020  Medication Sig   ASPIRIN LOW DOSE 81 MG EC tablet TAKE 1 TABLET BY MOUTH  DAILY (SWALLOW WHOLE)   blood glucose meter kit and supplies Dispense based on patient and insurance preference. Use up to four times daily as directed. (FOR ICD-10 E10.9, E11.9). Use to check blood sugar three times daily   cyclobenzaprine (FLEXERIL) 10 MG tablet Take 1 tablet (10 mg total) by mouth 3 (three) times daily as needed for muscle spasms.   empagliflozin (JARDIANCE) 25 MG TABS tablet Take 25 mg by mouth daily.   furosemide (LASIX) 20 MG tablet TAKE 1 TABLET BY MOUTH  EVERY OTHER DAY   gabapentin (NEURONTIN) 100 MG capsule TAKE 1 CAPSULE BY MOUTH 3  TIMES DAILY   glipiZIDE (GLUCOTROL) 10 MG tablet TAKE 1 TABLET BY MOUTH  TWICE DAILY BEFORE MEALS   HYDROcodone-acetaminophen (NORCO) 10-325 MG tablet Take 1 tablet by mouth every 6 (six) hours as needed for severe pain.   Insulin Lispro Prot & Lispro (HUMALOG MIX 75/25 KWIKPEN) (75-25) 100  UNIT/ML Kwikpen INJECT SUBCUTANEOUSLY 50  UNITS IN THE MORNING AND  120 UNITS IN THE EVENING  PREMEALS   Insulin Pen Needle 32G X 6 MM MISC Use daily with insulin pen   Lancets (FREESTYLE) lancets 1 each by Other route 2 (two) times daily. DX 250.02   losartan (COZAAR) 50 MG tablet TAKE 1 TABLET BY MOUTH  DAILY   NONFORMULARY OR COMPOUNDED ITEM [facility]:  Wart Cream - Cimetidine 2%, Deoxy-D-Glucose 0.2%, Fluorouracil-5 5%, Salicylic Acid, apply to the affected area 2 times daily.   ONETOUCH VERIO test strip USE TO CHECK BLOOD SUGAR 3  TIMES DAILY   tamsulosin (FLOMAX) 0.4 MG CAPS capsule TAKE 1 CAPSULE BY MOUTH  DAILY   traMADol (ULTRAM) 50 MG tablet TAKE 1 TABLET BY MOUTH  EVERY 6 HOURS AS NEEDED FOR MODERATE PAIN FOR SPINAL  STENOSIS.   [DISCONTINUED] Continuous Blood Gluc Sensor (FREESTYLE LIBRE 14 DAY SENSOR) MISC Use to check blood sugar at least 4 times daily.   No facility-administered encounter medications on file as of 01/12/2020.     Objective:   Goals Addressed              This Visit's Progress     Patient Stated     [name] "I am worried about costs on Medicare" (pt-stated)        CARE PLAN ENTRY (see longitudinal plan of care for additional care plan information)  Current Barriers:   Diabetes: uncontrolled, complicated by chronic medical conditions including HTN, HLD, OA, most recent  A1c 8.3%.  o Called today noting he is interested in trialing CGM.  Current antihyperglycemic regimen: Humalog 75/25 50 units QAM, 120 units QPM; glipizide 10 mg BID, Jardiance 25 mg daily  Cardiovascular risk reduction: o Current hypertensive regimen: losartan 50 mg daily, furosemide 20 mg daily o Current hyperlipidemia regimen: none, ASCVD risk 27.8%, reported hx statin intolerance, but name of medication tried unknown o Current antiplatelet regimen: ASA 81 mg daily   Neuropathy: gabapentin 100 mg TID  Pharmacist Clinical Goal(s):   Over the next 90 days,  patient will work with [name] and primary care provider to address optimized medication management  Interventions:  Comprehensive medication review performed, medication list updated in electronic medical record  Inter-disciplinary care team collaboration (see longitudinal plan of care)  Sent script for FreeStyle Libre 14 day sensors to [place]. Patient calls me back, copay was >$200. Will utilize a sample to trial [place] for this patient, then pursue coverage through [facility] moving forward.   Patient Self Care Activities:   Patient will check blood glucose daily , document, and provide at future appointments  Patient will take medications as prescribed  Patient will report any questions or concerns to provider   Please see past updates related to this goal by clicking on the "Past Updates" button in the selected goal          Plan:  - Will outreach patient as next scheduled  [name], [name], [place], [place] Pharmacist [place] [phone]

## 2020-01-14 ENCOUNTER — Ambulatory Visit: Payer: Medicare HMO | Admitting: Pharmacist

## 2020-01-14 DIAGNOSIS — E1165 Type 2 diabetes mellitus with hyperglycemia: Secondary | ICD-10-CM

## 2020-01-14 MED ORDER — NOVOLOG FLEXPEN 100 UNIT/ML ~~LOC~~ SOPN: PEN_INJECTOR | SUBCUTANEOUS | 2 refills | Status: DC

## 2020-01-14 MED ORDER — OZEMPIC (0.25 OR 0.5 MG/DOSE) 2 MG/1.5ML ~~LOC~~ SOPN: PEN_INJECTOR | SUBCUTANEOUS | 2 refills | Status: DC

## 2020-01-14 MED ORDER — TRESIBA FLEXTOUCH 200 UNIT/ML ~~LOC~~ SOPN: 80.0000 [IU] | PEN_INJECTOR | Freq: Every day | SUBCUTANEOUS | 2 refills | Status: DC

## 2020-01-14 NOTE — Chronic Care Management (AMB) (Signed)
Chronic Care Management   Follow Up Note   01/14/2020 Name: Connor Ross MRN: [id number] DOB: 07/25/1965  Referred by: [name], MD Reason for referral : Chronic Care Management (Medication Management)   Connor Ross is a 54 y.o. year old male who is a primary care patient of [name], [name], MD. The CCM team was consulted for assistance with chronic disease management and care coordination needs.    Contacted patient for medication management review.  Review of patient status, including review of consultants reports, relevant laboratory and other test results, and collaboration with appropriate care team members and the patient's provider was performed as part of comprehensive patient evaluation and provision of chronic care management services.    SDOH (Social Determinants of Health) assessments performed: Yes See Care Plan activities for detailed interventions related to [facility])     Outpatient Encounter Medications as of 01/14/2020  Medication Sig   ASPIRIN LOW DOSE 81 MG EC tablet TAKE 1 TABLET BY MOUTH  DAILY (SWALLOW WHOLE)   blood glucose meter kit and supplies Dispense based on patient and insurance preference. Use up to four times daily as directed. (FOR ICD-10 E10.9, E11.9). Use to check blood sugar three times daily   cyclobenzaprine (FLEXERIL) 10 MG tablet Take 1 tablet (10 mg total) by mouth 3 (three) times daily as needed for muscle spasms.   empagliflozin (JARDIANCE) 25 MG TABS tablet Take 25 mg by mouth daily.   furosemide (LASIX) 20 MG tablet TAKE 1 TABLET BY MOUTH  EVERY OTHER DAY   gabapentin (NEURONTIN) 100 MG capsule TAKE 1 CAPSULE BY MOUTH 3  TIMES DAILY   glipiZIDE (GLUCOTROL) 10 MG tablet TAKE 1 TABLET BY MOUTH  TWICE DAILY BEFORE MEALS   HYDROcodone-acetaminophen (NORCO) 10-325 MG tablet Take 1 tablet by mouth every 6 (six) hours as needed for severe pain.   insulin aspart (NOVOLOG FLEXPEN) 100 UNIT/ML FlexPen Inject 20 units up to  three times daily with meals   insulin degludec (TRESIBA FLEXTOUCH) 200 UNIT/ML FlexTouch Pen Inject 80 Units into the skin daily.   Insulin Lispro Prot & Lispro (HUMALOG MIX 75/25 KWIKPEN) (75-25) 100 UNIT/ML Kwikpen INJECT SUBCUTANEOUSLY 50  UNITS IN THE MORNING AND  120 UNITS IN THE EVENING  PREMEALS   Insulin Pen Needle 32G X 6 MM MISC Use daily with insulin pen   Lancets (FREESTYLE) lancets 1 each by Other route 2 (two) times daily. DX 250.02   losartan (COZAAR) 50 MG tablet TAKE 1 TABLET BY MOUTH  DAILY   NONFORMULARY OR COMPOUNDED ITEM [facility]:  Wart Cream - Cimetidine 2%, Deoxy-D-Glucose 0.2%, Fluorouracil-5 5%, Salicylic Acid, apply to the affected area 2 times daily.   ONETOUCH VERIO test strip USE TO CHECK BLOOD SUGAR 3  TIMES DAILY   Semaglutide,0.25 or 0.5MG/DOS, (OZEMPIC, 0.25 OR 0.5 MG/DOSE,) 2 MG/1.5ML SOPN Inject 0.25 mg once weekly x 4 weeks, then inject 0.5 mg weekly thereafter   tamsulosin (FLOMAX) 0.4 MG CAPS capsule TAKE 1 CAPSULE BY MOUTH  DAILY   traMADol (ULTRAM) 50 MG tablet TAKE 1 TABLET BY MOUTH  EVERY 6 HOURS AS NEEDED FOR MODERATE PAIN FOR SPINAL  STENOSIS.   No facility-administered encounter medications on file as of 01/14/2020.     Objective:   Goals Addressed              This Visit's Progress     Patient Stated     [name] "I am worried about costs on Medicare" (pt-stated)  CARE PLAN ENTRY (see longitudinal plan of care for additional care plan information)  Current Barriers:   Diabetes: uncontrolled, complicated by chronic medical conditions including HTN, HLD, OA, most recent A1c 8.3%  Worried about Medicare copay costs. Discussing transition to patient assistance to pursue more appropriate treatment and affordable medications   Current antihyperglycemic regimen: Humalog 75/25 50 units QAM, 120 units QPM; glipizide 10 mg BID, Jardiance 25 mg daily. May end up being over income for   Current glucose readings:   o Fasting: 108-170s, occasional episodes of hypoglycemia   Current meal patterns:  o Breakfast: generally skips o Lunch: smaller meal  o Supper: biggest meal of the day  Cardiovascular risk reduction: o Current hypertensive regimen: losartan 50 mg daily, furosemide 20 mg daily o Current hyperlipidemia regimen: none, ASCVD risk 27.8%, reported hx statin intolerance, but name of medication tried unknown o Current antiplatelet regimen: ASA 81 mg daily   Neuropathy: gabapentin 100 mg TID  Pharmacist Clinical Goal(s):   Over the next 90 days, patient will work with [name] and primary care provider to address optimized medication management  Interventions:  Comprehensive medication review performed, medication list updated in electronic medical record  Inter-disciplinary care team collaboration (see longitudinal plan of care)  Discussed addition of GLP1 for weight loss, reduced insulin burden. Patient would qualify for assistance. Discussed Ozempic. Patient amenable. Will secure sample of Ozempic for patient to start in the next 1-2 weeks.   Discussed [employer] requirements for patient assistance for [employer], for ease of 1 application. Given current doses, will transition to [place] U200 80 units once daily + Novolog 20 units with meals (doses reduced d/t concurrent GLP1).   Will collaborate w/ CPhT to mail patient portion of application. He is aware of need of proof of income as well as copy of insurance card. Will collaborate w/ Dr. [name] for application signature  Patient Self Care Activities:   Patient will check blood glucose daily , document, and provide at future appointments  Patient will take medications as prescribed  Patient will report any questions or concerns to provider   Please see past updates related to this goal by clicking on the "Past Updates" button in the selected goal          Plan:  - Scheduled outreach in ~6 weeks  [name],  [name], [place], [place] Pharmacist [place] [phone]

## 2020-01-14 NOTE — Patient Instructions (Signed)
Visit Information  Goals Addressed              This Visit's Progress     Patient Stated   .  [name] "I am worried about costs on Medicare" (pt-stated)        CARE PLAN ENTRY (see longitudinal plan of care for additional care plan information)  Current Barriers:  . Diabetes: uncontrolled, complicated by chronic medical conditions including HTN, HLD, OA, most recent A1c 8.3% . Worried about Medicare copay costs. Discussing transition to patient assistance to pursue more appropriate treatment and affordable medications  . Current antihyperglycemic regimen: Humalog 75/25 50 units QAM, 120 units QPM; glipizide 10 mg BID, Jardiance 25 mg daily. May end up being over income for  . Current glucose readings:  o Fasting: 108-170s, occasional episodes of hypoglycemia  . Current meal patterns:  o Breakfast: generally skips o Lunch: smaller meal  o Supper: biggest meal of the day . Cardiovascular risk reduction: o Current hypertensive regimen: losartan 50 mg daily, furosemide 20 mg daily o Current hyperlipidemia regimen: none, ASCVD risk 27.8%, reported hx statin intolerance, but name of medication tried unknown o Current antiplatelet regimen: ASA 81 mg daily  . Neuropathy: gabapentin 100 mg TID  Pharmacist Clinical Goal(s):  [name] Over the next 90 days, patient will work with [name] and primary care provider to address optimized medication management  Interventions: . Comprehensive medication review performed, medication list updated in electronic medical record . Inter-disciplinary care team collaboration (see longitudinal plan of care) . Discussed addition of GLP1 for weight loss, reduced insulin burden. Patient would qualify for assistance. Discussed Ozempic. Patient amenable. Will secure sample of Ozempic for patient to start in the next 1-2 weeks.  . Discussed [employer] requirements for patient assistance for [employer], for ease of 1 application. Given current doses, will  transition to [place] U200 80 units once daily + Novolog 20 units with meals (doses reduced d/t concurrent GLP1).  . Will collaborate w/ CPhT to mail patient portion of application. He is aware of need of proof of income as well as copy of insurance card. Will collaborate w/ Dr. [name] for application signature  Patient Self Care Activities:  . Patient will check blood glucose daily , document, and provide at future appointments . Patient will take medications as prescribed . Patient will report any questions or concerns to provider   Please see past updates related to this goal by clicking on the "Past Updates" button in the selected goal         The patient verbalized understanding of instructions provided today and declined a print copy of patient instruction materials.   Plan:  - Scheduled outreach in ~6 weeks  [name], [name], [place], CPP Clinical Pharmacist [place] [phone]

## 2020-01-18 ENCOUNTER — Other Ambulatory Visit: Payer: Self-pay | Admitting: Pharmacy Technician

## 2020-01-18 NOTE — Patient Outreach (Signed)
[facility]) Care Management  01/18/2020  Connor Ross 1966-02-07 [id number]                                      Medication Assistance Referral  Referral From: [facility] Embedded RPh Catie T.   Medication/Company: Mirian Mo, Ozempic / [employer] Patient application portion:  Education officer, museum portion:  N/A Embedded RPh to have signed while in clinic to Dr. Deborra Medina Provider address/fax verified via: Office website    Follow up:  Will follow up with patient in 5-15 business days to confirm application(s) have been received.  [name] P. Reva Pinkley, [place]  [phone]

## 2020-01-19 ENCOUNTER — Ambulatory Visit: Payer: Self-pay | Admitting: Pharmacist

## 2020-01-19 DIAGNOSIS — E1165 Type 2 diabetes mellitus with hyperglycemia: Secondary | ICD-10-CM

## 2020-01-19 NOTE — Progress Notes (Signed)
Faxed

## 2020-01-19 NOTE — Patient Instructions (Signed)
Visit Information  Goals Addressed              This Visit's Progress     Patient Stated   .  [name] "I am worried about costs on Medicare" (pt-stated)        [place] (see longitudinal plan of care for additional care plan information)  Current Barriers:  . Diabetes: uncontrolled, complicated by chronic medical conditions including HTN, HLD, OA, most recent A1c 8.3% o Provided with sample of FreeStyle Libre. Notes that the first sensor didn't work, so he called [employer]. They are sending him a replacement sensor.  . Current antihyperglycemic regimen: Humalog 75/25 50 units QAM, 120 units QPM; glipizide 10 mg BID, Jardiance 25 mg daily. . Current meal patterns:  o Breakfast: generally skips o Lunch: smaller meal  o Supper: biggest meal of the day . Cardiovascular risk reduction: o Current hypertensive regimen: losartan 50 mg daily, furosemide 20 mg daily o Current hyperlipidemia regimen: none, ASCVD risk 27.8%, reported hx statin intolerance, but name of medication tried unknown o Current antiplatelet regimen: ASA 81 mg daily  . Neuropathy: gabapentin 100 mg TID  Pharmacist Clinical Goal(s):  [name] Over the next 90 days, patient will work with [name] and primary care provider to address optimized medication management  Interventions: . Comprehensive medication review performed, medication list updated in electronic medical record . Inter-disciplinary care team collaboration (see longitudinal plan of care) . Encouraged to call me back when he is receives the replacement sensor from [employer] and has it successfully placed.   Patient Self Care Activities:  . Patient will check blood glucose daily , document, and provide at future appointments . Patient will take medications as prescribed . Patient will report any questions or concerns to provider   Please see past updates related to this goal by clicking on the "Past Updates" button in the selected goal         The patient  verbalized understanding of instructions provided today and declined a print copy of patient instruction materials.   Plan:  - Will outreach as previously scheduled  [name], [name], [place], [place] Pharmacist [place] [phone]

## 2020-01-19 NOTE — Progress Notes (Signed)
Filled out and placed in quick sign folder for signature.

## 2020-01-19 NOTE — Chronic Care Management (AMB) (Signed)
Chronic Care Management   Follow Up Note   01/19/2020 Name: Connor Ross MRN: [id number] DOB: [birth year]  Referred by: [name], MD Reason for referral : Chronic Care Management (Medication Management)   Connor Ross is a 54 y.o. year old male who is a primary care patient of [name], [name], MD. The CCM team was consulted for assistance with chronic disease management and care coordination needs.    Received call from patient with device questions.   Review of patient status, including review of consultants reports, relevant laboratory and other test results, and collaboration with appropriate care team members and the patient's provider was performed as part of comprehensive patient evaluation and provision of chronic care management services.    SDOH (Social Determinants of Health) assessments performed: No See Care Plan activities for detailed interventions related to [name])     Outpatient Encounter Medications as of 01/19/2020  Medication Sig  . ASPIRIN LOW DOSE 81 MG EC tablet TAKE 1 TABLET BY MOUTH  DAILY (SWALLOW WHOLE)  . blood glucose meter kit and supplies Dispense based on patient and insurance preference. Use up to four times daily as directed. (FOR ICD-10 E10.9, E11.9). Use to check blood sugar three times daily  . cyclobenzaprine (FLEXERIL) 10 MG tablet Take 1 tablet (10 mg total) by mouth 3 (three) times daily as needed for muscle spasms.  . empagliflozin (JARDIANCE) 25 MG TABS tablet Take 25 mg by mouth daily.  . furosemide (LASIX) 20 MG tablet TAKE 1 TABLET BY MOUTH  EVERY OTHER DAY  . gabapentin (NEURONTIN) 100 MG capsule TAKE 1 CAPSULE BY MOUTH 3  TIMES DAILY  . glipiZIDE (GLUCOTROL) 10 MG tablet TAKE 1 TABLET BY MOUTH  TWICE DAILY BEFORE MEALS  . HYDROcodone-acetaminophen (NORCO) 10-325 MG tablet Take 1 tablet by mouth every 6 (six) hours as needed for severe pain.  [name] insulin aspart (NOVOLOG FLEXPEN) 100 UNIT/ML FlexPen Inject 20 units up to  three times daily with meals  . insulin degludec (TRESIBA FLEXTOUCH) 200 UNIT/ML FlexTouch Pen Inject 80 Units into the skin daily.  . Insulin Lispro Prot & Lispro (HUMALOG MIX 75/25 KWIKPEN) (75-25) 100 UNIT/ML Kwikpen INJECT SUBCUTANEOUSLY 50  UNITS IN THE MORNING AND  120 UNITS IN THE EVENING  PREMEALS  . Insulin Pen Needle 32G X 6 MM MISC Use daily with insulin pen  . Lancets (FREESTYLE) lancets 1 each by Other route 2 (two) times daily. DX 250.02  . losartan (COZAAR) 50 MG tablet TAKE 1 TABLET BY MOUTH  DAILY  . NONFORMULARY OR COMPOUNDED ITEM [facility]:  Wart Cream - Cimetidine 2%, Deoxy-D-Glucose 0.2%, Fluorouracil-5 5%, Salicylic Acid, apply to the affected area 2 times daily.  [name] VERIO test strip USE TO CHECK BLOOD SUGAR 3  TIMES DAILY  . Semaglutide,0.25 or 0.5MG/DOS, (OZEMPIC, 0.25 OR 0.5 MG/DOSE,) 2 MG/1.5ML SOPN Inject 0.25 mg once weekly x 4 weeks, then inject 0.5 mg weekly thereafter  . tamsulosin (FLOMAX) 0.4 MG CAPS capsule TAKE 1 CAPSULE BY MOUTH  DAILY  . traMADol (ULTRAM) 50 MG tablet TAKE 1 TABLET BY MOUTH  EVERY 6 HOURS AS NEEDED FOR MODERATE PAIN FOR SPINAL  STENOSIS.   No facility-administered encounter medications on file as of 01/19/2020.     Objective:   Goals Addressed              This Visit's Progress     Patient Stated   .  [name] "I am worried about costs on Medicare" (pt-stated)  CARE PLAN ENTRY (see longitudinal plan of care for additional care plan information)  Current Barriers:  . Diabetes: uncontrolled, complicated by chronic medical conditions including HTN, HLD, OA, most recent A1c 8.3% o Provided with sample of FreeStyle Libre. Notes that the first sensor didn't work, so he called [employer]. They are sending him a replacement sensor.  . Current antihyperglycemic regimen: Humalog 75/25 50 units QAM, 120 units QPM; glipizide 10 mg BID, Jardiance 25 mg daily. . Current meal patterns:  o Breakfast: generally skips o Lunch:  smaller meal  o Supper: biggest meal of the day . Cardiovascular risk reduction: o Current hypertensive regimen: losartan 50 mg daily, furosemide 20 mg daily o Current hyperlipidemia regimen: none, ASCVD risk 27.8%, reported hx statin intolerance, but name of medication tried unknown o Current antiplatelet regimen: ASA 81 mg daily  . Neuropathy: gabapentin 100 mg TID  Pharmacist Clinical Goal(s):  [name] Over the next 90 days, patient will work with [name] and primary care provider to address optimized medication management  Interventions: . Comprehensive medication review performed, medication list updated in electronic medical record . Inter-disciplinary care team collaboration (see longitudinal plan of care) . Encouraged to call me back when he is receives the replacement sensor from [employer] and has it successfully placed.   Patient Self Care Activities:  . Patient will check blood glucose daily , document, and provide at future appointments . Patient will take medications as prescribed . Patient will report any questions or concerns to provider   Please see past updates related to this goal by clicking on the "Past Updates" button in the selected goal          Plan:  - Will outreach as previously scheduled  [name], [name], [name], [name] Pharmacist [name] [phone]

## 2020-01-20 ENCOUNTER — Ambulatory Visit (INDEPENDENT_AMBULATORY_CARE_PROVIDER_SITE_OTHER): Payer: Medicare HMO | Admitting: Pharmacist

## 2020-01-20 ENCOUNTER — Telehealth: Payer: Self-pay | Admitting: Pharmacist

## 2020-01-20 DIAGNOSIS — E1165 Type 2 diabetes mellitus with hyperglycemia: Secondary | ICD-10-CM

## 2020-01-20 DIAGNOSIS — E785 Hyperlipidemia, unspecified: Secondary | ICD-10-CM | POA: Diagnosis not present

## 2020-01-20 NOTE — Telephone Encounter (Signed)
Samples picked up 01/14/20

## 2020-01-20 NOTE — Chronic Care Management (AMB) (Signed)
Chronic Care Management   Follow Up Note   01/20/2020 Name: Connor Ross MRN: [id number] DOB: [birth year]  Referred by: [name], MD Reason for referral : Chronic Care Management (Medication Management)   Connor Ross is a 54 y.o. year old male who is a primary care patient of [name], [name], MD. The CCM team was consulted for assistance with chronic disease management and care coordination needs.    Care coordination completed today.   Connor Ross was given information about Chronic Care Management services today including:  1. CCM service includes personalized support from designated clinical staff supervised by his physician, including individualized plan of care and coordination with other care providers 2. 24/7 contact phone numbers for assistance for urgent and routine care needs. 3. Service will only be billed when office clinical staff spend 20 minutes or more in a month to coordinate care. 4. Only one practitioner may furnish and bill the service in a calendar month. 5. The patient may stop CCM services at any time (effective at the end of the month) by phone call to the office staff. 6. The patient will be responsible for cost sharing (co-pay) of up to 20% of the service fee (after annual deductible is met).  Patient agreed to services and verbal consent obtained.   Review of patient status, including review of consultants reports, relevant laboratory and other test results, and collaboration with appropriate care team members and the patient's provider was performed as part of comprehensive patient evaluation and provision of chronic care management services.    SDOH (Social Determinants of Health) assessments performed: Yes See Care Plan activities for detailed interventions related to SDOH)  SDOH Interventions     Most Recent Value  SDOH Interventions  Financial Strain Interventions Other (Comment)  [patient assistance program]        Outpatient Encounter Medications as of 01/20/2020  Medication Sig  . ASPIRIN LOW DOSE 81 MG EC tablet TAKE 1 TABLET BY MOUTH  DAILY (SWALLOW WHOLE)  . blood glucose meter kit and supplies Dispense based on patient and insurance preference. Use up to four times daily as directed. (FOR ICD-10 E10.9, E11.9). Use to check blood sugar three times daily  . cyclobenzaprine (FLEXERIL) 10 MG tablet Take 1 tablet (10 mg total) by mouth 3 (three) times daily as needed for muscle spasms.  . empagliflozin (JARDIANCE) 25 MG TABS tablet Take 25 mg by mouth daily.  . furosemide (LASIX) 20 MG tablet TAKE 1 TABLET BY MOUTH  EVERY OTHER DAY  . gabapentin (NEURONTIN) 100 MG capsule TAKE 1 CAPSULE BY MOUTH 3  TIMES DAILY  . glipiZIDE (GLUCOTROL) 10 MG tablet TAKE 1 TABLET BY MOUTH  TWICE DAILY BEFORE MEALS  . HYDROcodone-acetaminophen (NORCO) 10-325 MG tablet Take 1 tablet by mouth every 6 (six) hours as needed for severe pain.  [name] insulin aspart (NOVOLOG FLEXPEN) 100 UNIT/ML FlexPen Inject 20 units up to three times daily with meals  . insulin degludec (TRESIBA FLEXTOUCH) 200 UNIT/ML FlexTouch Pen Inject 80 Units into the skin daily.  . Insulin Lispro Prot & Lispro (HUMALOG MIX 75/25 KWIKPEN) (75-25) 100 UNIT/ML Kwikpen INJECT SUBCUTANEOUSLY 50  UNITS IN THE MORNING AND  120 UNITS IN THE EVENING  PREMEALS  . Insulin Pen Needle 32G X 6 MM MISC Use daily with insulin pen  . Lancets (FREESTYLE) lancets 1 each by Other route 2 (two) times daily. DX 250.02  . losartan (COZAAR) 50 MG tablet TAKE 1 TABLET BY MOUTH  DAILY  . NONFORMULARY OR COMPOUNDED ITEM [facility]:  Wart Cream - Cimetidine 2%, Deoxy-D-Glucose 0.2%, Fluorouracil-5 5%, Salicylic Acid, apply to the affected area 2 times daily.  [name] VERIO test strip USE TO CHECK BLOOD SUGAR 3  TIMES DAILY  . Semaglutide,0.25 or 0.5MG/DOS, (OZEMPIC, 0.25 OR 0.5 MG/DOSE,) 2 MG/1.5ML SOPN Inject 0.25 mg once weekly x 4 weeks, then inject 0.5 mg weekly thereafter   . tamsulosin (FLOMAX) 0.4 MG CAPS capsule TAKE 1 CAPSULE BY MOUTH  DAILY  . traMADol (ULTRAM) 50 MG tablet TAKE 1 TABLET BY MOUTH  EVERY 6 HOURS AS NEEDED FOR MODERATE PAIN FOR SPINAL  STENOSIS.   No facility-administered encounter medications on file as of 01/20/2020.     Objective:   Goals Addressed              This Visit's Progress     Patient Stated   .  [name] "I am worried about costs on Medicare" (pt-stated)        CARE PLAN ENTRY (see longitudinal plan of care for additional care plan information)  Current Barriers:  . Diabetes: uncontrolled, complicated by chronic medical conditions including HTN, HLD, OA, most recent A1c 8.3% o Working on patient assistance  . Current antihyperglycemic regimen: Humalog 75/25 50 units QAM, 120 units QPM; glipizide 10 mg BID, Jardiance 25 mg daily-> plan to switch to [place] U200 80 units daily, Novolog up to 20 units TID, and add Ozempic 0.25 mg weekly x 4 weeks then increase to 0.5 mg weekly . Current meal patterns:  o Breakfast: generally skips o Lunch: smaller meal  o Supper: biggest meal of the day . Cardiovascular risk reduction: o Current hypertensive regimen: losartan 50 mg daily, furosemide 20 mg daily o Current hyperlipidemia regimen: none, ASCVD risk 27.8%, reported hx statin intolerance, but name of medication tried unknown o Current antiplatelet regimen: ASA 81 mg daily  . Neuropathy: gabapentin 100 mg TID  Pharmacist Clinical Goal(s):  [name] Over the next 90 days, patient will work with [name] and primary care provider to address optimized medication management  Interventions: . Comprehensive medication review performed, medication list updated in electronic medical record . Inter-disciplinary care team collaboration (see longitudinal plan of care) . Prepared provider portion of Novo Nordisk app to be signed by Dr. [name]. Will pass along to CPhT to submit to [employer] once all parts are received.  [name] Prepared Ozempic  sample for patient.   Patient Self Care Activities:  . Patient will check blood glucose daily , document, and provide at future appointments . Patient will take medications as prescribed . Patient will report any questions or concerns to provider   Please see past updates related to this goal by clicking on the "Past Updates" button in the selected goal          Plan:  - Will continue to collaborate w/ patient as above. Rescheduled f/u in ~3 weeks to f/u on PAP   [name], [name], [place], [place] Pharmacist [place] [phone]

## 2020-01-20 NOTE — Telephone Encounter (Signed)
**Note Connor-Identified via Obfuscation** Medication Samples have been pulled, logged, and labeled for the patient.  Drug name: Ozempic       Strength: 0.25/0.5 mg        Qty: 1 pen  LOT: [id number]  Exp.Date: 07/2021  Dosing instructions: Inject 0.25 mg once weekly for 4 weeks, then increase to 0.5 mg weekly  The patient has been instructed regarding the correct time, dose, and frequency of taking this medication, including desired effects and most common side effects.   Patient aware to come pick up at his convenience. Medication is in the sample fridge.  Connor Ross 10:50 AM 01/20/2020

## 2020-01-20 NOTE — Telephone Encounter (Signed)
[name] and I submitted the DME order form for his [name] on 01/19/20 FYI.

## 2020-01-20 NOTE — Patient Instructions (Addendum)
Visit Information  Goals Addressed              This Visit's Progress     Patient Stated   .  [name] "I am worried about costs on Medicare" (pt-stated)        CARE PLAN ENTRY (see longitudinal plan of care for additional care plan information)  Current Barriers:  . Diabetes: uncontrolled, complicated by chronic medical conditions including HTN, HLD, OA, most recent A1c 8.3% o Working on patient assistance  . Current antihyperglycemic regimen: Humalog 75/25 50 units QAM, 120 units QPM; glipizide 10 mg BID, Jardiance 25 mg daily-> plan to switch to [place] U200 80 units daily, Novolog up to 20 units TID, and add Ozempic 0.25 mg weekly x 4 weeks then increase to 0.5 mg weekly . Current meal patterns:  o Breakfast: generally skips o Lunch: smaller meal  o Supper: biggest meal of the day . Cardiovascular risk reduction: o Current hypertensive regimen: losartan 50 mg daily, furosemide 20 mg daily o Current hyperlipidemia regimen: none, ASCVD risk 27.8%, reported hx statin intolerance, but name of medication tried unknown o Current antiplatelet regimen: ASA 81 mg daily  . Neuropathy: gabapentin 100 mg TID  Pharmacist Clinical Goal(s):  [name] Over the next 90 days, patient will work with [name] and primary care provider to address optimized medication management  Interventions: . Comprehensive medication review performed, medication list updated in electronic medical record . Inter-disciplinary care team collaboration (see longitudinal plan of care) . Prepared provider portion of Novo Nordisk app to be signed by Dr. [name]. Will pass along to CPhT to submit to Nov Nordisk once all parts are received.  [name] Prepared Ozempic sample for patient.   Patient Self Care Activities:  . Patient will check blood glucose daily , document, and provide at future appointments . Patient will take medications as prescribed . Patient will report any questions or concerns to provider   Please see past  updates related to this goal by clicking on the "Past Updates" button in the selected goal         Connor Ross was given information about Chronic Care Management services today including:  1. CCM service includes personalized support from designated clinical staff supervised by his physician, including individualized plan of care and coordination with other care providers 2. 24/7 contact phone numbers for assistance for urgent and routine care needs. 3. Service will only be billed when office clinical staff spend 20 minutes or more in a month to coordinate care. 4. Only one practitioner may furnish and bill the service in a calendar month. 5. The patient may stop CCM services at any time (effective at the end of the month) by phone call to the office staff. 6. The patient will be responsible for cost sharing (co-pay) of up to 20% of the service fee (after annual deductible is met).  Patient agreed to services and verbal consent obtained.   The patient verbalized understanding of instructions provided today and declined a print copy of patient instruction materials.    Plan:  - Will continue to collaborate w/ patient as above. Rescheduled f/u in ~3 weeks to f/u on PAP      Connor Ross, [name], [place], [place] Pharmacist [place] [phone]

## 2020-01-24 NOTE — Telephone Encounter (Signed)
Patient came in to the office today and picked up [place].   Went over the instructions for dosage and how to load the pen. Patient verbalized understanding

## 2020-01-31 ENCOUNTER — Other Ambulatory Visit: Payer: Self-pay | Admitting: Pharmacy Technician

## 2020-01-31 NOTE — Patient Outreach (Signed)
[facility]) Care Management  01/31/2020  Connor Ross [birth year] [id number]   Successful call placed to patient regarding patient assistance application(s) for [name], Ozempic , HIPAA identifiers verified.   Patient informs he received the application and mailed it back with his social security awards statement and his wife's check stub as documentation for proof of income.  Patient also wanted me to know that the DME company to which the office sent his [name] order denied the claim. He also informed that he likes the concept of the [place] device but is not sure it is for him. He informed the sample he received from [facility] embedded [place] would never scan so he called the company and was able to get a replacement. The replacement worked fine but then the sensor fell off after a day of yard work and daily hygiene habits. He informed another replacement is on the way. He wanted to discuss with someone the denial request and the sensor issue.    Follow up:  Will route note to [facility] embedded RPh [name] to assistance with the [name] issues and will close patient's case in 15 business days if applicaiton has not been received back.  Elleni Mozingo P. Haelee Bolen, [place]  [phone]

## 2020-02-01 ENCOUNTER — Other Ambulatory Visit: Payer: Self-pay | Admitting: Pharmacy Technician

## 2020-02-01 NOTE — Patient Outreach (Signed)
[facility]) Care Management  02/01/2020  Connor Ross 1965/11/05 [id number]  Received both patient and provider portion(s) of patient assistance application(s) for Tresiba, Novolog, Ozempic. Faxed completed application and required documents into [employer].  Will follow up with company(ies) in 2-3 business days to check status of application(s).  [name] P. Mikeila Burgen, [place]  [phone]

## 2020-02-03 ENCOUNTER — Ambulatory Visit: Payer: Self-pay | Admitting: Pharmacist

## 2020-02-03 DIAGNOSIS — E785 Hyperlipidemia, unspecified: Secondary | ICD-10-CM | POA: Diagnosis not present

## 2020-02-03 DIAGNOSIS — E1165 Type 2 diabetes mellitus with hyperglycemia: Secondary | ICD-10-CM

## 2020-02-03 NOTE — Chronic Care Management (AMB) (Signed)
Chronic Care Management   Follow Up Note   02/03/2020 Name: Connor Ross MRN: [id number] DOB: 07/05/1966  Referred by: [name], MD Reason for referral : Chronic Care Management (Medication Management)   Connor Ross is a 54 y.o. year old male who is a primary care patient of Connor Ross, Connor Everts, MD. The CCM team was consulted for assistance with chronic disease management and care coordination needs.    Contacted patient for follow up on medication access needs  Review of patient status, including review of consultants reports, relevant laboratory and other test results, and collaboration with appropriate care team members and the patient's provider was performed as part of comprehensive patient evaluation and provision of chronic care management services.    SDOH (Social Determinants of Health) assessments performed: Yes See Care Plan activities for detailed interventions related to SDOH)  SDOH Interventions     Most Recent Value  SDOH Interventions  Financial Strain Interventions Other (Comment)  [medication assistance]       Outpatient Encounter Medications as of 02/03/2020  Medication Sig Note  . ASPIRIN LOW DOSE 81 MG EC tablet TAKE 1 TABLET BY MOUTH  DAILY (SWALLOW WHOLE)   . blood glucose meter kit and supplies Dispense based on patient and insurance preference. Use up to four times daily as directed. (FOR ICD-10 E10.9, E11.9). Use to check blood sugar three times daily   . cyclobenzaprine (FLEXERIL) 10 MG tablet Take 1 tablet (10 mg total) by mouth 3 (three) times daily as needed for muscle spasms.   . empagliflozin (JARDIANCE) 25 MG TABS tablet Take 25 mg by mouth daily.   . furosemide (LASIX) 20 MG tablet TAKE 1 TABLET BY MOUTH  EVERY OTHER DAY   . gabapentin (NEURONTIN) 100 MG capsule TAKE 1 CAPSULE BY MOUTH 3  TIMES DAILY   . glipiZIDE (GLUCOTROL) 10 MG tablet TAKE 1 TABLET BY MOUTH  TWICE DAILY BEFORE MEALS   . Insulin Lispro Prot & Lispro (HUMALOG  MIX 75/25 KWIKPEN) (75-25) 100 UNIT/ML Kwikpen INJECT SUBCUTANEOUSLY 50  UNITS IN THE MORNING AND  120 UNITS IN THE EVENING  PREMEALS 02/03/2020: 25 units QAM, 75 units QPM  . Insulin Pen Needle 32G X 6 MM MISC Use daily with insulin pen   . Lancets (FREESTYLE) lancets 1 each by Other route 2 (two) times daily. DX 250.02   . losartan (COZAAR) 50 MG tablet TAKE 1 TABLET BY MOUTH  DAILY   . NONFORMULARY OR COMPOUNDED ITEM [facility]:  Wart Cream - Cimetidine 2%, Deoxy-D-Glucose 0.2%, Fluorouracil-5 5%, Salicylic Acid, apply to the affected area 2 times daily.   [name] VERIO test strip USE TO CHECK BLOOD SUGAR 3  TIMES DAILY   . Semaglutide,0.25 or 0.5MG/DOS, (OZEMPIC, 0.25 OR 0.5 MG/DOSE,) 2 MG/1.5ML SOPN Inject 0.25 mg once weekly x 4 weeks, then inject 0.5 mg weekly thereafter   . tamsulosin (FLOMAX) 0.4 MG CAPS capsule TAKE 1 CAPSULE BY MOUTH  DAILY   . traMADol (ULTRAM) 50 MG tablet TAKE 1 TABLET BY MOUTH  EVERY 6 HOURS AS NEEDED FOR MODERATE PAIN FOR SPINAL  STENOSIS.   . HYDROcodone-acetaminophen (NORCO) 10-325 MG tablet Take 1 tablet by mouth every 6 (six) hours as needed for severe pain. (Patient not taking: Reported on 02/03/2020)   . insulin aspart (NOVOLOG FLEXPEN) 100 UNIT/ML FlexPen Inject 20 units up to three times daily with meals (Patient not taking: Reported on 02/03/2020)   . insulin degludec (TRESIBA FLEXTOUCH) 200 UNIT/ML FlexTouch Pen  Inject 80 Units into the skin daily. (Patient not taking: Reported on 02/03/2020)    No facility-administered encounter medications on file as of 02/03/2020.     Objective:   Goals Addressed              This Visit's Progress     Patient Stated   .  [name] "I am worried about costs on Medicare" (pt-stated)        CARE PLAN ENTRY (see longitudinal plan of care for additional care plan information)  Current Barriers:  . Diabetes: uncontrolled, complicated by chronic medical conditions including HTN, HLD, OA, most recent A1c  8.3% o Reports that he heard from his insurance that [place] coverage was denied. o He notes that when he placed his second [place] sensor, he did yard work and was sweaty. When he was taking off his shirt later, the shirt accidentally ripped off the [place] sensor.  o Wonders how to get his medications to his new preferred mail order pharmacy, [facility] order . Current antihyperglycemic regimen: Humalog 75/25 25 units QAM, 75 units QPM; glipizide 10 mg BID, Jardiance 25 mg daily-> plan to switch to [place] U200 80 units daily, Novolog up to 20 units TID, Ozempic 0.25 mg x 2 weeks o Applied for [employer] assistance for [employer], and [employer] o Has self-reduced doses of Humalog 75/25 since starting Ozempic o Does indicate some decreased appetite from starting Ozempic o Notes Jardiance is $45/month. He notes he would be able to afford this, since he will be receiving injectable medications for free . Current glucose readings: currently using Libre to check sugars 5-6 times daily  o Fastings: 80-150s o After Breakfast: 130-150s o After lunch: 120-140s o After supper: 140-150s o Bedtime:  130-150s . Cardiovascular risk reduction: o Current hypertensive regimen: losartan 50 mg daily, furosemide 20 mg daily o Current hyperlipidemia regimen: none, ASCVD risk 27.8%, reported hx statin intolerance, but name of medication tried unknown o Current antiplatelet regimen: ASA 81 mg daily  . Neuropathy: gabapentin 100 mg TID  Pharmacist Clinical Goal(s):  [name] Over the next 90 days, patient will work with [name] and primary care provider to address optimized medication management  Interventions: . Comprehensive medication review performed, medication list updated in electronic medical record . Inter-disciplinary care team collaboration (see longitudinal plan of care) . Reviewed goal A1c, goal fasting, and goal 2 hour post prandial glucose readings. Praised for improvement through starting Ozempic and through  use of FreeStyle Libre to monitor sugars more frequently. Urged to call me whenever approved for Novo medications and when they arrive, as we will need to reduce Tresiba/Novolog doses from the prescribed doses, now that he is using less Humalog 75/25 . Reviewed CV, CKD benefits of Jardiance. Encouraged to continue to take, though to let me know if it becomes too expensive. If he falls into Coverage Gap, he may be able to pursue Jardiance assistance . Reviewed to contact his new [employer] mail order pharmacy to ask to transfer scripts from [facility] . Reviewed that her could purchase Tegaderm bandages to cover [place] sensor to prevent it from becoming dislodged  . Patient will bring [place] 2 reader with him to our next face to face f/u and I will download. Once transitioned to [place] and he is injecting insulin 3+ times daily and continues to check sugars 4+ times daily, will resubmit order for [place] and insurance should approve at that time.    Patient Self Care Activities:  . Patient  will check blood glucose daily , document, and provide at future appointments . Patient will take medications as prescribed . Patient will report any questions or concerns to provider   Please see past updates related to this goal by clicking on the "Past Updates" button in the selected goal          Plan:  - Will meet w/ patient face to face for CGM download in ~2 weeks  [name], [name], [place], [place] Pharmacist [place] [phone]

## 2020-02-03 NOTE — Patient Instructions (Addendum)
Connor Ross,   It was great talking with you today!  Keep scanning your sugar at least every 6 hours to make sure all data is captured. Bring your sensor with you to the appointment with me (see below) and we will download the results.   We've sent the application for [place], Novolog, and Ozempic to [employer]. Sharee Pimple will be following up with them. Once approved, the medications will ship here to our office, and someone will call you to come pick them up.   CALL ME before you switch over your insulins. We'll need to talk about what doses of Humalog 75/25 you are taking, since you are requiring less, so I can give you some good guidance of where to start with [place] and Novolog. I don't want you to take too much and have low blood sugars.   Take care,   [name], [name], [place], CPP Clinical Pharmacist [facility] [phone]    Visit Information  Goals Addressed              This Visit's Progress     Patient Stated   .  [name] "I am worried about costs on Medicare" (pt-stated)        CARE PLAN ENTRY (see longitudinal plan of care for additional care plan information)  Current Barriers:  . Diabetes: uncontrolled, complicated by chronic medical conditions including HTN, HLD, OA, most recent A1c 8.3% o Reports that he heard from his insurance that [place] coverage was denied. o He notes that when he placed his second [place] sensor, he did yard work and was sweaty. When he was taking off his shirt later, the shirt accidentally ripped off the [place] sensor.  o Wonders how to get his medications to his new preferred mail order pharmacy, [facility] order . Current antihyperglycemic regimen: Humalog 75/25 25 units QAM, 75 units QPM; glipizide 10 mg BID, Jardiance 25 mg daily-> plan to switch to [place] U200 80 units daily, Novolog up to 20 units TID, Ozempic 0.25 mg x 2 weeks o Applied for [employer] assistance for [employer], and  [employer] o Has self-reduced doses of Humalog 75/25 since starting Ozempic o Does indicate some decreased appetite from starting Ozempic o Notes Jardiance is $45/month. He notes he would be able to afford this, since he will be receiving injectable medications for free . Current glucose readings: currently using Libre to check sugars 5-6 times daily  o Fastings: 80-150s o After Breakfast: 130-150s o After lunch: 120-140s o After supper: 140-150s o Bedtime:  130-150s . Cardiovascular risk reduction: o Current hypertensive regimen: losartan 50 mg daily, furosemide 20 mg daily o Current hyperlipidemia regimen: none, ASCVD risk 27.8%, reported hx statin intolerance, but name of medication tried unknown o Current antiplatelet regimen: ASA 81 mg daily  . Neuropathy: gabapentin 100 mg TID  Pharmacist Clinical Goal(s):  [name] Over the next 90 days, patient will work with [name] and primary care provider to address optimized medication management  Interventions: . Comprehensive medication review performed, medication list updated in electronic medical record . Inter-disciplinary care team collaboration (see longitudinal plan of care) . Reviewed goal A1c, goal fasting, and goal 2 hour post prandial glucose readings. Praised for improvement through starting Ozempic and through use of FreeStyle Libre to monitor sugars more frequently. Urged to call me whenever approved for Novo medications and when they arrive, as we will need to reduce Tresiba/Novolog doses from the prescribed doses, now that he is using less Humalog 75/25 .  Reviewed CV, CKD benefits of Jardiance. Encouraged to continue to take, though to let me know if it becomes too expensive. If he falls into Coverage Gap, he may be able to pursue Jardiance assistance . Reviewed to contact his new [employer] mail order pharmacy to ask to transfer scripts from [facility] . Reviewed that her could purchase Tegaderm bandages to cover [place] sensor to prevent it from  becoming dislodged  . Patient will bring [place] 2 reader with him to our next face to face f/u and I will download. Once transitioned to [place] and he is injecting insulin 3+ times daily and continues to check sugars 4+ times daily, will resubmit order for [place] and insurance should approve at that time.    Patient Self Care Activities:  . Patient will check blood glucose daily , document, and provide at future appointments . Patient will take medications as prescribed . Patient will report any questions or concerns to provider   Please see past updates related to this goal by clicking on the "Past Updates" button in the selected goal         The patient verbalized understanding of instructions provided today and agreed to receive a mailed copy of patient instruction and/or educational materials. Plan:  - Will meet w/ patient face to face for CGM download in ~2 weeks  [name], [name], [place], [place] Pharmacist [place] [phone]

## 2020-02-04 ENCOUNTER — Other Ambulatory Visit: Payer: Self-pay | Admitting: Pharmacy Technician

## 2020-02-04 NOTE — Patient Outreach (Signed)
[facility]) Care Management  02/04/2020  Connor Ross January 01, 1966 [id number]  Care coordination call placed to [place] in regards to patient's application for Tresiba, Novolog and Ozempic.  Spoke to [place] who informed patient was APPROVED 02/03/2020-06/13/2020. She informed patient would be receiving 6 boxes of [place] and 5 boxes each of Ozempic and Novolog. She informed these should be delivered to the office in the next 10-14 business days so around August 6-12.  Will follow up with patient in 14-21 business days to confirm medication was received and to discuss refill procedure.  Zackry Deines P. [name], [place]  [phone]

## 2020-02-15 ENCOUNTER — Ambulatory Visit (INDEPENDENT_AMBULATORY_CARE_PROVIDER_SITE_OTHER): Payer: Medicare HMO | Admitting: Pharmacist

## 2020-02-15 ENCOUNTER — Other Ambulatory Visit: Payer: Self-pay

## 2020-02-15 DIAGNOSIS — E1165 Type 2 diabetes mellitus with hyperglycemia: Secondary | ICD-10-CM

## 2020-02-15 NOTE — Patient Instructions (Signed)
Visit Information  Goals Addressed              This Visit's Progress     Patient Stated   .  [name] "I am worried about costs on Medicare" (pt-stated)        CARE PLAN ENTRY (see longitudinal plan of care for additional care plan information)  Current Barriers:  . Social, financial, and community barriers:  o Patient continues to experience stress d/t caregiver burden. Wife had heart transplant in October, had post-surgical complications of wound infection. Continues on IV ABX at home, patient providing care for her. Notes that when he is stressed, he practices mindfulness techniques such as deep breathing. Really enjoys fishing, but has physically been unable to do this recently (unable to carry all of his gear and walk through the woods) . Diabetes: uncontrolled, complicated by chronic medical conditions including HTN, HLD, OA, most recent A1c 8.3% . Current antihyperglycemic regimen: Humalog 75/25 50 units QAM, 100 units QPM; glipizide 10 mg BID, Jardiance 25 mg daily, Ozempic 0.25 mg x 4 weeks, due to increase next week to 0.5 mg -> plan to switch to [place] U200 80 units daily, Novolog up to 20 units TID o APPROVED for Ozempic, Tresiba, and Novolog through [employer] through 07/14/20- awaiting shipment, projected to arrive next week o Confirms affordability of Jardiance at this time Utilized CGM sample:  Date of Download: 7/15-7/28/21 % Time CGM is active: 58% Average Glucose: 125 mg/dL Glucose Variability: 29.1% (goal <36%) Time in Goal:  - Time in range 70-180: 89% - Time above range: 9% - Time below range: 2% Sugars since CGM trial ended:  Date Fasting Before Dinner Bedtime  28-Jul 112 167 174  29-Jul 138 224 143  30-Jul 173 129 170  31-Jul 151 105   1-Aug 140  253  2-Aug 113 136   3-Aug 155     . Cardiovascular risk reduction: o Current hypertensive regimen: losartan 50 mg daily, furosemide 20 mg daily o Current hyperlipidemia regimen: none, ASCVD risk 27.8%,  reported hx statin intolerance, but name of medication tried unknown o Current antiplatelet regimen: ASA 81 mg daily  . Neuropathy: gabapentin 100 mg TID  Pharmacist Clinical Goal(s):  [name] Over the next 90 days, patient will work with [name] and primary care provider to address optimized medication management  Interventions: . Comprehensive medication review performed, medication list updated in electronic medical record . Inter-disciplinary care team collaboration (see longitudinal plan of care) . Significant improvement in glucose readings w/ use of FreeStyle Libre 2 CGM. Allowed for reduced doses of insulin and overall greater control. Patient would benefit from continued use of CGM to help with self-adjustment of insulin regimen. Will place order for FreeStyle [place] 2 sensors today.  . Increase Ozempic to 0.5 mg next week as previously instructed.  . Discussed utilizing methods like Skin Tac or Degaderm to aid in adhesion and protect sensor from popping off.   Patient Self Care Activities:  . Patient will check blood glucose daily , document, and provide at future appointments . Patient will take medications as prescribed . Patient will report any questions or concerns to provider   Please see past updates related to this goal by clicking on the "Past Updates" button in the selected goal         The patient verbalized understanding of instructions provided today and declined a print copy of patient instruction materials.    Plan:  - Scheduled f/u call in ~ 4 weeks  [name], [name], [place], CPP Clinical Pharmacist [place] [phone]

## 2020-02-15 NOTE — Chronic Care Management (AMB) (Signed)
Chronic Care Management   Follow Up Note   02/15/2020 Name: Connor Ross MRN: [id number] DOB: 10/08/1965  Referred by: [name], MD Reason for referral : Chronic Care Management (Medication Management)   Connor Ross is a 54 y.o. year old male who is a primary care patient of [name], [name], MD. The CCM team was consulted for assistance with chronic disease management and care coordination needs.    Met with patient face to face for CGM download and medication management review.  Review of patient status, including review of consultants reports, relevant laboratory and other test results, and collaboration with appropriate care team members and the patient's provider was performed as part of comprehensive patient evaluation and provision of chronic care management services.    SDOH (Social Determinants of Health) assessments performed: Yes See Care Plan activities for detailed interventions related to SDOH)  SDOH Interventions     Most Recent Value  SDOH Interventions  Financial Strain Interventions Other (Comment)  [medication assistance]  Stress Interventions Provide Counseling       Outpatient Encounter Medications as of 02/15/2020  Medication Sig Note  . ASPIRIN LOW DOSE 81 MG EC tablet TAKE 1 TABLET BY MOUTH  DAILY (SWALLOW WHOLE)   . blood glucose meter kit and supplies Dispense based on patient and insurance preference. Use up to four times daily as directed. (FOR ICD-10 E10.9, E11.9). Use to check blood sugar three times daily   . empagliflozin (JARDIANCE) 25 MG TABS tablet Take 25 mg by mouth daily.   . furosemide (LASIX) 20 MG tablet TAKE 1 TABLET BY MOUTH  EVERY OTHER DAY   . gabapentin (NEURONTIN) 100 MG capsule TAKE 1 CAPSULE BY MOUTH 3  TIMES DAILY   . glipiZIDE (GLUCOTROL) 10 MG tablet TAKE 1 TABLET BY MOUTH  TWICE DAILY BEFORE MEALS   . Insulin Lispro Prot & Lispro (HUMALOG MIX 75/25 KWIKPEN) (75-25) 100 UNIT/ML Kwikpen INJECT SUBCUTANEOUSLY  50  UNITS IN THE MORNING AND  120 UNITS IN THE EVENING  PREMEALS 02/15/2020: 50 units QAM, 100 units QPM  . Insulin Pen Needle 32G X 6 MM MISC Use daily with insulin pen   . Lancets (FREESTYLE) lancets 1 each by Other route 2 (two) times daily. DX 250.02   . losartan (COZAAR) 50 MG tablet TAKE 1 TABLET BY MOUTH  DAILY   . NONFORMULARY OR COMPOUNDED ITEM [facility]:  Wart Cream - Cimetidine 2%, Deoxy-D-Glucose 0.2%, Fluorouracil-5 5%, Salicylic Acid, apply to the affected area 2 times daily.   [name] VERIO test strip USE TO CHECK BLOOD SUGAR 3  TIMES DAILY   . Semaglutide,0.25 or 0.5MG/DOS, (OZEMPIC, 0.25 OR 0.5 MG/DOSE,) 2 MG/1.5ML SOPN Inject 0.25 mg once weekly x 4 weeks, then inject 0.5 mg weekly thereafter   . tamsulosin (FLOMAX) 0.4 MG CAPS capsule TAKE 1 CAPSULE BY MOUTH  DAILY   . traMADol (ULTRAM) 50 MG tablet TAKE 1 TABLET BY MOUTH  EVERY 6 HOURS AS NEEDED FOR MODERATE PAIN FOR SPINAL  STENOSIS.   [name] cyclobenzaprine (FLEXERIL) 10 MG tablet Take 1 tablet (10 mg total) by mouth 3 (three) times daily as needed for muscle spasms. (Patient not taking: Reported on 02/15/2020)   . HYDROcodone-acetaminophen (NORCO) 10-325 MG tablet Take 1 tablet by mouth every 6 (six) hours as needed for severe pain. (Patient not taking: Reported on 02/03/2020)   . insulin aspart (NOVOLOG FLEXPEN) 100 UNIT/ML FlexPen Inject 20 units up to three times daily with meals (Patient not  taking: Reported on 02/03/2020)   . insulin degludec (TRESIBA FLEXTOUCH) 200 UNIT/ML FlexTouch Pen Inject 80 Units into the skin daily. (Patient not taking: Reported on 02/03/2020)    No facility-administered encounter medications on file as of 02/15/2020.     Objective:   Goals Addressed              This Visit's Progress     Patient Stated   .  [name] "I am worried about costs on Medicare" (pt-stated)        CARE PLAN ENTRY (see longitudinal plan of care for additional care plan information)  Current Barriers:  . Social,  financial, and community barriers:  o Patient continues to experience stress d/t caregiver burden. Wife had heart transplant in October, had post-surgical complications of wound infection. Continues on IV ABX at home, patient providing care for her. Notes that when he is stressed, he practices mindfulness techniques such as deep breathing. Really enjoys fishing, but has physically been unable to do this recently (unable to carry all of his gear and walk through the woods) . Diabetes: uncontrolled, complicated by chronic medical conditions including HTN, HLD, OA, most recent A1c 8.3% . Current antihyperglycemic regimen: Humalog 75/25 50 units QAM, 100 units QPM; glipizide 10 mg BID, Jardiance 25 mg daily, Ozempic 0.25 mg x 4 weeks, due to increase next week to 0.5 mg -> plan to switch to [place] U200 80 units daily, Novolog up to 20 units TID o APPROVED for Ozempic, Tresiba, and Novolog through [employer] through 07/14/20- awaiting shipment, projected to arrive next week o Confirms affordability of Jardiance at this time Utilized CGM sample:  Date of Download: 7/15-7/28/21 % Time CGM is active: 58% Average Glucose: 125 mg/dL Glucose Variability: 29.1% (goal <36%) Time in Goal:  - Time in range 70-180: 89% - Time above range: 9% - Time below range: 2% Sugars since CGM trial ended:  Date Fasting Before Dinner Bedtime  28-Jul 112 167 174  29-Jul 138 224 143  30-Jul 173 129 170  31-Jul 151 105   1-Aug 140  253  2-Aug 113 136   3-Aug 155     . Cardiovascular risk reduction: o Current hypertensive regimen: losartan 50 mg daily, furosemide 20 mg daily o Current hyperlipidemia regimen: none, ASCVD risk 27.8%, reported hx statin intolerance, but name of medication tried unknown o Current antiplatelet regimen: ASA 81 mg daily  . Neuropathy: gabapentin 100 mg TID  Pharmacist Clinical Goal(s):  [name] Over the next 90 days, patient will work with [name] and primary care provider to address optimized  medication management  Interventions: . Comprehensive medication review performed, medication list updated in electronic medical record . Inter-disciplinary care team collaboration (see longitudinal plan of care) . Significant improvement in glucose readings w/ use of FreeStyle Libre 2 CGM. Allowed for reduced doses of insulin and overall greater control. Patient would benefit from continued use of CGM to help with self-adjustment of insulin regimen. Will place order for FreeStyle [place] 2 sensors today.  . Increase Ozempic to 0.5 mg next week as previously instructed.  . Discussed utilizing methods like Skin Tac or Degaderm to aid in adhesion and protect sensor from popping off.   Patient Self Care Activities:  . Patient will check blood glucose daily , document, and provide at future appointments . Patient will take medications as prescribed . Patient will report any questions or concerns to provider   Please see past updates related to this goal by clicking on the "  Past Updates" button in the selected goal          Plan:  - Scheduled f/u call in ~ 4 weeks  [name], [name], [place], [place] Pharmacist [place] [phone]

## 2020-02-16 ENCOUNTER — Ambulatory Visit: Payer: Medicare HMO | Admitting: Pharmacist

## 2020-02-16 DIAGNOSIS — E1165 Type 2 diabetes mellitus with hyperglycemia: Secondary | ICD-10-CM

## 2020-02-16 NOTE — Patient Instructions (Signed)
Visit Information  Goals Addressed              This Visit's Progress     Patient Stated   .  [name] "I am worried about costs on Medicare" (pt-stated)        CARE PLAN ENTRY (see longitudinal plan of care for additional care plan information)  Current Barriers:  . Social, financial, and community barriers:  o Patient continues to experience stress d/t caregiver burden and caring for wife.  . Diabetes: uncontrolled, complicated by chronic medical conditions including HTN, HLD, OA, most recent A1c 8.3% . Current antihyperglycemic regimen: Humalog 75/25 50 units QAM, 100 units QPM; glipizide 10 mg BID, Jardiance 25 mg daily, Ozempic 0.25 mg x 4 weeks, due to increase next week to 0.5 mg -> plan to switch to [place] U200 80 units daily, Novolog up to 20 units TID o APPROVED for Ozempic, Tresiba, and Novolog through [employer] through 07/14/20- awaiting shipment, projected to arrive next week o Confirms affordability of Jardiance at this time . Current glucose readings: working on CGM approval through insurance . Cardiovascular risk reduction: o Current hypertensive regimen: losartan 50 mg daily, furosemide 20 mg daily o Current hyperlipidemia regimen: none, ASCVD risk 27.8%, reported hx statin intolerance, but name of medication tried unknown o Current antiplatelet regimen: ASA 81 mg daily  . Neuropathy: gabapentin 100 mg TID  Pharmacist Clinical Goal(s):  [name] Over the next 90 days, patient will work with [name] and primary care provider to address optimized medication management  Interventions: . Comprehensive medication review performed, medication list updated in electronic medical record . Inter-disciplinary care team collaboration (see longitudinal plan of care) . Submitted CGM order form to [place], as they contract w/ [employer]. Will f/u with rep in ~1-2 weeks  Patient Self Care Activities:  . Patient will check blood glucose daily , document, and provide at future  appointments . Patient will take medications as prescribed . Patient will report any questions or concerns to provider   Please see past updates related to this goal by clicking on the "Past Updates" button in the selected goal         The patient verbalized understanding of instructions provided today and declined a print copy of patient instruction materials.    Plan:  - Will f/u with patient as previously scheduled  [name], [name], [place], [place] Pharmacist [facility] [phone]

## 2020-02-16 NOTE — Chronic Care Management (AMB) (Signed)
Chronic Care Management   Follow Up Note   02/16/2020 Name: Connor Ross MRN: [id number] DOB: 10-10-1965  Referred by: Connor Mc, MD Reason for referral : Chronic Care Management (Medication Management)   Connor Ross is a 54 y.o. year old male who is a primary care patient of [name], [name], MD. The CCM team was consulted for assistance with chronic disease management and care coordination needs.    Care coordination completed today.   Review of patient status, including review of consultants reports, relevant laboratory and other test results, and collaboration with appropriate care team members and the patient's provider was performed as part of comprehensive patient evaluation and provision of chronic care management services.    SDOH (Social Determinants of Health) assessments performed: No See Care Plan activities for detailed interventions related to [facility])     Outpatient Encounter Medications as of 02/16/2020  Medication Sig Note  . ASPIRIN LOW DOSE 81 MG EC tablet TAKE 1 TABLET BY MOUTH  DAILY (SWALLOW WHOLE)   . blood glucose meter kit and supplies Dispense based on patient and insurance preference. Use up to four times daily as directed. (FOR ICD-10 E10.9, E11.9). Use to check blood sugar three times daily   . cyclobenzaprine (FLEXERIL) 10 MG tablet Take 1 tablet (10 mg total) by mouth 3 (three) times daily as needed for muscle spasms. (Patient not taking: Reported on 02/15/2020)   . empagliflozin (JARDIANCE) 25 MG TABS tablet Take 25 mg by mouth daily.   . furosemide (LASIX) 20 MG tablet TAKE 1 TABLET BY MOUTH  EVERY OTHER DAY   . gabapentin (NEURONTIN) 100 MG capsule TAKE 1 CAPSULE BY MOUTH 3  TIMES DAILY   . glipiZIDE (GLUCOTROL) 10 MG tablet TAKE 1 TABLET BY MOUTH  TWICE DAILY BEFORE MEALS   . HYDROcodone-acetaminophen (NORCO) 10-325 MG tablet Take 1 tablet by mouth every 6 (six) hours as needed for severe pain. (Patient not taking: Reported on  02/03/2020)   . insulin aspart (NOVOLOG FLEXPEN) 100 UNIT/ML FlexPen Inject 20 units up to three times daily with meals (Patient not taking: Reported on 02/03/2020)   . insulin degludec (TRESIBA FLEXTOUCH) 200 UNIT/ML FlexTouch Pen Inject 80 Units into the skin daily. (Patient not taking: Reported on 02/03/2020)   . Insulin Lispro Prot & Lispro (HUMALOG MIX 75/25 KWIKPEN) (75-25) 100 UNIT/ML Kwikpen INJECT SUBCUTANEOUSLY 50  UNITS IN THE MORNING AND  120 UNITS IN THE EVENING  PREMEALS 02/15/2020: 50 units QAM, 100 units QPM  . Insulin Pen Needle 32G X 6 MM MISC Use daily with insulin pen   . Lancets (FREESTYLE) lancets 1 each by Other route 2 (two) times daily. DX 250.02   . losartan (COZAAR) 50 MG tablet TAKE 1 TABLET BY MOUTH  DAILY   . NONFORMULARY OR COMPOUNDED ITEM [facility]:  Wart Cream - Cimetidine 2%, Deoxy-D-Glucose 0.2%, Fluorouracil-5 5%, Salicylic Acid, apply to the affected area 2 times daily.   Connor Ross VERIO test strip USE TO CHECK BLOOD SUGAR 3  TIMES DAILY   . Semaglutide,0.25 or 0.5MG/DOS, (OZEMPIC, 0.25 OR 0.5 MG/DOSE,) 2 MG/1.5ML SOPN Inject 0.25 mg once weekly x 4 weeks, then inject 0.5 mg weekly thereafter   . tamsulosin (FLOMAX) 0.4 MG CAPS capsule TAKE 1 CAPSULE BY MOUTH  DAILY   . traMADol (ULTRAM) 50 MG tablet TAKE 1 TABLET BY MOUTH  EVERY 6 HOURS AS NEEDED FOR MODERATE PAIN FOR SPINAL  STENOSIS.    No facility-administered encounter medications on file  as of 02/16/2020.     Objective:   Goals Addressed              This Visit's Progress     Patient Stated   .  [name] "I am worried about costs on Medicare" (pt-stated)        CARE PLAN ENTRY (see longitudinal plan of care for additional care plan information)  Current Barriers:  . Social, financial, and community barriers:  o Patient continues to experience stress d/t caregiver burden and caring for wife.  . Diabetes: uncontrolled, complicated by chronic medical conditions including HTN, HLD, OA, most  recent A1c 8.3% . Current antihyperglycemic regimen: Humalog 75/25 50 units QAM, 100 units QPM; glipizide 10 mg BID, Jardiance 25 mg daily, Ozempic 0.25 mg x 4 weeks, due to increase next week to 0.5 mg -> plan to switch to [place] U200 80 units daily, Novolog up to 20 units TID o APPROVED for Ozempic, Tresiba, and Novolog through [employer] through 07/14/20- awaiting shipment, projected to arrive next week o Confirms affordability of Jardiance at this time . Current glucose readings: working on CGM approval through insurance . Cardiovascular risk reduction: o Current hypertensive regimen: losartan 50 mg daily, furosemide 20 mg daily o Current hyperlipidemia regimen: none, ASCVD risk 27.8%, reported hx statin intolerance, but name of medication tried unknown o Current antiplatelet regimen: ASA 81 mg daily  . Neuropathy: gabapentin 100 mg TID  Pharmacist Clinical Goal(s):  Connor Ross Over the next 90 days, patient will work with [name] and primary care provider to address optimized medication management  Interventions: . Comprehensive medication review performed, medication list updated in electronic medical record . Inter-disciplinary care team collaboration (see longitudinal plan of care) . Submitted CGM order form to [place], as they contract w/ [employer]. Will f/u with rep in ~1-2 weeks  Patient Self Care Activities:  . Patient will check blood glucose daily , document, and provide at future appointments . Patient will take medications as prescribed . Patient will report any questions or concerns to provider   Please see past updates related to this goal by clicking on the "Past Updates" button in the selected goal          Plan:  - Will f/u with patient as previously scheduled  [name], [name], [place], [place] Pharmacist [place] [phone]

## 2020-02-18 ENCOUNTER — Telehealth: Payer: Self-pay | Admitting: Pharmacist

## 2020-02-18 NOTE — Telephone Encounter (Signed)
°  Chronic Care Management   Note  02/18/2020 Name: Connor Ross MRN: [id number] DOB: April 09, 1966   Patient called me today, LVM noting that he was approved to receive FreeStyle Libre through his insurance. Rescheduled our 9/1 phone call visit to face to face.   [name], [name], [place], CPP Clinical Pharmacist [place] [phone]

## 2020-02-19 DIAGNOSIS — E1165 Type 2 diabetes mellitus with hyperglycemia: Secondary | ICD-10-CM | POA: Diagnosis not present

## 2020-02-24 ENCOUNTER — Ambulatory Visit: Payer: Medicare HMO | Admitting: Pharmacist

## 2020-02-24 ENCOUNTER — Telehealth: Payer: Self-pay

## 2020-02-24 DIAGNOSIS — E1165 Type 2 diabetes mellitus with hyperglycemia: Secondary | ICD-10-CM | POA: Diagnosis not present

## 2020-02-24 DIAGNOSIS — Z794 Long term (current) use of insulin: Secondary | ICD-10-CM | POA: Diagnosis not present

## 2020-02-24 NOTE — Chronic Care Management (AMB) (Signed)
Chronic Care Management   Follow Up Note   02/24/2020 Name: Connor Ross MRN: [id number] DOB: May 18, 1966  Referred by: [name], MD Reason for referral : Chronic Care Management (Medication Management)   Connor Ross is a 54 y.o. year old male who is a primary care patient of [name], [name], MD. The CCM team was consulted for assistance with chronic disease management and care coordination needs.  Contacted patient to f/u on receipt of Ozempic, Novolog, and [place] today.   Review of patient status, including review of consultants reports, relevant laboratory and other test results, and collaboration with appropriate care team members and the patient's provider was performed as part of comprehensive patient evaluation and provision of chronic care management services.    SDOH (Social Determinants of Health) assessments performed: Yes See Care Plan activities for detailed interventions related to SDOH)  SDOH Interventions     Most Recent Value  SDOH Interventions  Financial Strain Interventions Other (Comment)  [patient assistance]       Outpatient Encounter Medications as of 02/24/2020  Medication Sig Note  . Semaglutide,0.25 or 0.5MG/DOS, (OZEMPIC, 0.25 OR 0.5 MG/DOSE,) 2 MG/1.5ML SOPN Inject 0.25 mg once weekly x 4 weeks, then inject 0.5 mg weekly thereafter   . ASPIRIN LOW DOSE 81 MG EC tablet TAKE 1 TABLET BY MOUTH  DAILY (SWALLOW WHOLE)   . blood glucose meter kit and supplies Dispense based on patient and insurance preference. Use up to four times daily as directed. (FOR ICD-10 E10.9, E11.9). Use to check blood sugar three times daily   . cyclobenzaprine (FLEXERIL) 10 MG tablet Take 1 tablet (10 mg total) by mouth 3 (three) times daily as needed for muscle spasms. (Patient not taking: Reported on 02/15/2020)   . empagliflozin (JARDIANCE) 25 MG TABS tablet Take 25 mg by mouth daily.   . furosemide (LASIX) 20 MG tablet TAKE 1 TABLET BY MOUTH  EVERY OTHER  DAY   . gabapentin (NEURONTIN) 100 MG capsule TAKE 1 CAPSULE BY MOUTH 3  TIMES DAILY   . glipiZIDE (GLUCOTROL) 10 MG tablet TAKE 1 TABLET BY MOUTH  TWICE DAILY BEFORE MEALS   . HYDROcodone-acetaminophen (NORCO) 10-325 MG tablet Take 1 tablet by mouth every 6 (six) hours as needed for severe pain. (Patient not taking: Reported on 02/03/2020)   . insulin aspart (NOVOLOG FLEXPEN) 100 UNIT/ML FlexPen Inject 20 units up to three times daily with meals (Patient not taking: Reported on 02/03/2020)   . insulin degludec (TRESIBA FLEXTOUCH) 200 UNIT/ML FlexTouch Pen Inject 80 Units into the skin daily. (Patient not taking: Reported on 02/03/2020)   . Insulin Lispro Prot & Lispro (HUMALOG MIX 75/25 KWIKPEN) (75-25) 100 UNIT/ML Kwikpen INJECT SUBCUTANEOUSLY 50  UNITS IN THE MORNING AND  120 UNITS IN THE EVENING  PREMEALS 02/15/2020: 50 units QAM, 100 units QPM  . Insulin Pen Needle 32G X 6 MM MISC Use daily with insulin pen   . Lancets (FREESTYLE) lancets 1 each by Other route 2 (two) times daily. DX 250.02   . losartan (COZAAR) 50 MG tablet TAKE 1 TABLET BY MOUTH  DAILY   . NONFORMULARY OR COMPOUNDED ITEM [facility]:  Wart Cream - Cimetidine 2%, Deoxy-D-Glucose 0.2%, Fluorouracil-5 5%, Salicylic Acid, apply to the affected area 2 times daily.   [name] VERIO test strip USE TO CHECK BLOOD SUGAR 3  TIMES DAILY   . tamsulosin (FLOMAX) 0.4 MG CAPS capsule TAKE 1 CAPSULE BY MOUTH  DAILY   . traMADol Veatrice [place])  50 MG tablet TAKE 1 TABLET BY MOUTH  EVERY 6 HOURS AS NEEDED FOR MODERATE PAIN FOR SPINAL  STENOSIS.    No facility-administered encounter medications on file as of 02/24/2020.     Objective:   Goals Addressed              This Visit's Progress     Patient Stated   .  [name] "I am worried about costs on Medicare" (pt-stated)        CARE PLAN ENTRY (see longitudinal plan of care for additional care plan information)  Current Barriers:  . Social, financial, and community barriers:  o Picked  up [employer] PAP supplies for Connor Ross, and Novolog today . Diabetes: uncontrolled, complicated by chronic medical conditions including HTN, HLD, OA, most recent A1c 8.3% . Current antihyperglycemic regimen: Humalog 75/25 50 units QAM, 100 units QPM; Jardiance 25 mg daily, Ozempic 0.5 mg -> plan to switch to [place] U200 and Novolog  o APPROVED for [employer], Tresiba, and Novolog through [employer] through 07/14/20 o Confirms affordability of Jardiance at this time . Current glucose readings: working on CGM approval through insurance. Will meet on 9/1 for download and review . Cardiovascular risk reduction: o Current hypertensive regimen: losartan 50 mg daily, furosemide 20 mg daily o Current hyperlipidemia regimen: none, ASCVD risk 27.8%, reported hx statin intolerance, but name of medication tried unknown o Current antiplatelet regimen: ASA 81 mg daily  . Neuropathy: gabapentin 100 mg TID  Pharmacist Clinical Goal(s):  [name] Over the next 90 days, patient will work with [name] and primary care provider to address optimized medication management  Interventions: . Comprehensive medication review performed, medication list updated in electronic medical record . Inter-disciplinary care team collaboration (see longitudinal plan of care) . Reviewed refill procedure for [employer].  . Patient will continue Ozempic weekly w/ 70/30 insulin for now, as he has significant supply. When he comes to clinic for CGM download and review in ~ 3 weeks, we will determine how to transition to [place] and Novolog. Patient verbalized understanding  Patient Self Care Activities:  . Patient will check blood glucose daily , document, and provide at future appointments . Patient will take medications as prescribed . Patient will report any questions or concerns to provider   Please see past updates related to this goal by clicking on the "Past Updates" button in the selected goal          Plan:  -  Will meet with patient in ~3 weeks as previously scheduled  [name], [name], [place], [place] Pharmacist [place] [phone]

## 2020-02-24 NOTE — Telephone Encounter (Signed)
Spoke with pt to let him know that his pt assistance medication has arrived at the office and that he can come pick it up. Pt stated that he would be by today to get it.

## 2020-02-24 NOTE — Patient Instructions (Signed)
Visit Information  Goals Addressed              This Visit's Progress     Patient Stated   .  [name] "I am worried about costs on Medicare" (pt-stated)        CARE PLAN ENTRY (see longitudinal plan of care for additional care plan information)  Current Barriers:  . Social, financial, and community barriers:  o Picked up [employer] PAP supplies for [employer], and Novolog today . Diabetes: uncontrolled, complicated by chronic medical conditions including HTN, HLD, OA, most recent A1c 8.3% . Current antihyperglycemic regimen: Humalog 75/25 50 units QAM, 100 units QPM; Jardiance 25 mg daily, Ozempic 0.5 mg -> plan to switch to [place] U200 and Novolog  o APPROVED for [employer], Tresiba, and Novolog through [employer] through 07/14/20 o Confirms affordability of Jardiance at this time . Current glucose readings: working on CGM approval through insurance. Will meet on 9/1 for download and review . Cardiovascular risk reduction: o Current hypertensive regimen: losartan 50 mg daily, furosemide 20 mg daily o Current hyperlipidemia regimen: none, ASCVD risk 27.8%, reported hx statin intolerance, but name of medication tried unknown o Current antiplatelet regimen: ASA 81 mg daily  . Neuropathy: gabapentin 100 mg TID  Pharmacist Clinical Goal(s):  [name] Over the next 90 days, patient will work with [name] and primary care provider to address optimized medication management  Interventions: . Comprehensive medication review performed, medication list updated in electronic medical record . Inter-disciplinary care team collaboration (see longitudinal plan of care) . Reviewed refill procedure for [employer].  . Patient will continue Ozempic weekly w/ 70/30 insulin for now, as he has significant supply. When he comes to clinic for CGM download and review in ~ 3 weeks, we will determine how to transition to [place] and Novolog. Patient verbalized understanding  Patient Self Care Activities:   . Patient will check blood glucose daily , document, and provide at future appointments . Patient will take medications as prescribed . Patient will report any questions or concerns to provider   Please see past updates related to this goal by clicking on the "Past Updates" button in the selected goal         The patient verbalized understanding of instructions provided today and declined a print copy of patient instruction materials.  Plan:  - Will meet with patient in ~3 weeks as previously scheduled  [name], [name], [place], [place] Pharmacist [place] [phone]

## 2020-02-28 ENCOUNTER — Other Ambulatory Visit: Payer: Self-pay | Admitting: Pharmacy Technician

## 2020-02-28 NOTE — Patient Outreach (Signed)
[facility]) Care Management  02/28/2020  Connor Ross 1966/07/10 [id number]    Successful call placed to patient regarding patient assistance medication delivery of Tresiba, Novolog and Ozempic with [employer], HIPAA identifiers verified.   Patient informs he received all 3 medications and picked them up last week. Discussed refill procedure with patient which will require patient to outreach provider's office when he is down to around 2-3 weeks supply of medication to request his next refills and informed him to specify which medications he would need and that he receives it thru [employer] patient assistance foundation.  Also informed him that the provider's office should also receive a fax when it is time for his refills. The provider's office will need to fill out the document and fax it back to [employer] as [employer] does not accept refills from patients. Patient verbalized understanding. Confirmed patient had name and number as he had no other questions or concerns.  Follow up:  Will route note to embedded [place] for case closure as patient assistance has been completed.  Zuleima Haser P. Tawanda Schall, [place]  [phone]

## 2020-03-02 ENCOUNTER — Telehealth: Payer: Medicare HMO

## 2020-03-13 ENCOUNTER — Other Ambulatory Visit: Payer: Self-pay

## 2020-03-13 ENCOUNTER — Telehealth: Payer: Self-pay | Admitting: Internal Medicine

## 2020-03-13 DIAGNOSIS — E1165 Type 2 diabetes mellitus with hyperglycemia: Secondary | ICD-10-CM

## 2020-03-13 MED ORDER — FREESTYLE LIBRE 2 SENSOR MISC: 2 refills | Status: DC

## 2020-03-13 NOTE — Telephone Encounter (Signed)
Patient stated that he needs a prescription for a Free style libre 2 because his fell of  his

## 2020-03-13 NOTE — Telephone Encounter (Signed)
Patient received a voucher for [place] 2 sensor since his did not last 14 days, but needs script called to the local pharmacy to use. Sent script to [facility] on [address].   Will review at upcoming appt

## 2020-03-15 ENCOUNTER — Ambulatory Visit: Payer: Medicare HMO | Admitting: Pharmacist

## 2020-03-15 ENCOUNTER — Other Ambulatory Visit: Payer: Self-pay

## 2020-03-15 ENCOUNTER — Ambulatory Visit: Payer: Medicare HMO

## 2020-03-15 DIAGNOSIS — E1165 Type 2 diabetes mellitus with hyperglycemia: Secondary | ICD-10-CM

## 2020-03-15 DIAGNOSIS — E785 Hyperlipidemia, unspecified: Secondary | ICD-10-CM

## 2020-03-15 DIAGNOSIS — I152 Hypertension secondary to endocrine disorders: Secondary | ICD-10-CM

## 2020-03-15 DIAGNOSIS — G4733 Obstructive sleep apnea (adult) (pediatric): Secondary | ICD-10-CM | POA: Diagnosis not present

## 2020-03-15 MED ORDER — LOSARTAN POTASSIUM 50 MG PO TABS: 50.0000 mg | ORAL_TABLET | Freq: Every day | ORAL | 3 refills | Status: DC

## 2020-03-15 MED ORDER — FUROSEMIDE 20 MG PO TABS: 20.0000 mg | ORAL_TABLET | ORAL | 3 refills | Status: DC

## 2020-03-15 MED ORDER — EMPAGLIFLOZIN 25 MG PO TABS: 25.0000 mg | ORAL_TABLET | Freq: Every day | ORAL | 3 refills | Status: DC

## 2020-03-15 MED ORDER — NOVOLOG FLEXPEN 100 UNIT/ML ~~LOC~~ SOPN: PEN_INJECTOR | SUBCUTANEOUS | 2 refills | Status: DC

## 2020-03-15 NOTE — Patient Instructions (Addendum)
Continue Ozempic 0.5 mg weekly  STOP the 75/25 insulin. STOP glipizide  Start Tresiba 80 units every evening Start Novolog 15 units before every meal. If you eat a snack, you DO NOT need Novolog.   Schedule follow up appointment with Dr. [name] after 04/06/20  Call [facility] to request a refill of the [facility] sensors.   [name], [name] [phone]  Visit Information  Goals Addressed              This Visit's Progress     Patient Stated   .  [name] "I am worried about costs on Medicare" (pt-stated)        CARE PLAN ENTRY (see longitudinal plan of care for additional care plan information)  Current Barriers:  . Social, financial, and community barriers:  o None at this time. Reports that his wife's health is improving and she has been given clearance to return to work o Does report wanting to transition all medications to [employer] . Diabetes: uncontrolled, complicated by chronic medical conditions including HTN, HLD, OA, most recent A1c 8.3% . Current antihyperglycemic regimen: Humalog 75/25 25 units QAM, 100 units QPM; Jardiance 25 mg daily, Ozempic 0.5 mg -> plan to switch to [place] U200 and Novolog  o APPROVED for [employer], Tresiba, and Novolog through [employer] through 07/14/20 o Confirms affordability of Jardiance at this time . Current glucose readings: Free Style Libre 2 CGM Date of Download: 8/19-03/15/20 % Time CGM is active: 92% Average Glucose: 151 mg/dL Glucose Management Indicator: 6.9% Glucose Variability: 32.5% (goal <36%) Time in Goal:  - Time in range 70-180: 73% - Time above range: 24% - Time below range: 1% Observed patterns: Elevated post prandials . Cardiovascular risk reduction: o Current hypertensive regimen: losartan 50 mg daily, furosemide 20 mg daily o Current hyperlipidemia regimen: none, ASCVD risk 29%, reported hx statin intolerance, but name of medication unknown. Last LDL significantly elevated at  173 o Current antiplatelet regimen: ASA 81 mg daily  . Neuropathy: gabapentin 100 mg TID, reports that he does not feel this is sufficient, wonders if he should discuss increasing dose w/ Dr. [name]  Pharmacist Clinical Goal(s):  [name] Over the next 90 days, patient will work with [name] and primary care provider to address optimized medication management  Interventions: . Comprehensive medication review performed, medication list updated in electronic medical record . Inter-disciplinary care team collaboration (see longitudinal plan of care) . Reviewed glucose readings with patient. Currently taking total 125 units of 75/25 daily. 75% of 125 is ~93, 25% is ~30 units.  . Stop Humalog 75/25. Stop glipizide. Start Tresiba U200 80 units daily (dose reduction d/t/ improved potency than NPH due to longer half life). Start Novolog 15 units up to TID with meals. Continue Jardiance 25 mg daily.  . Sent refills to [facility] on furosemide, Jardiance, and losartan. Will collaborate w/ Dr. [name] to send refills on gabapentin, tamsulosin to [facility] mail order . Reviewed importance of lipid control for ASCVD risk reduction. Discussed statin intolerance. Discussed that next steps would be injectable PCSK9i therapy. Patient tentatively amenable. Would like to discuss w/ Dr. [name] at next appt. Can pursue [employer] assistance for cost support. . Patient to schedule f/u with Dr. [name] in the next few weeks after reviewing his wife's schedule.   Patient Self Care Activities:  . Patient will scan glucose at least QID, document, and provide at future appointments . Patient will take medications as prescribed . Patient will report  any questions or concerns to provider   Please see past updates related to this goal by clicking on the "Past Updates" button in the selected goal         Print copy of patient instructions provided.    Plan:  - Scheduled f/u call in ~ 6 weeks. Will follow for patient's  appt w/ PCP and consider changing to be co-visit  [name], [name], [place], [place] Pharmacist [place] [phone]

## 2020-03-15 NOTE — Chronic Care Management (AMB) (Addendum)
Chronic Care Management   Follow Up Note   03/15/2020 Name: Connor Ross MRN: [id number] DOB: [birth year]  Referred by: Connor Mc, MD Reason for referral : Chronic Care Management (Medication Management)   Connor Ross is a 54 y.o. year old male who is a primary care patient of [name], [name], MD. The CCM team was consulted for assistance with chronic disease management and care coordination needs.    Met with patient face to face for Cgm download and review.  Review of patient status, including review of consultants reports, relevant laboratory and other test results, and collaboration with appropriate care team members and the patient's provider was performed as part of comprehensive patient evaluation and provision of chronic care management services.    SDOH (Social Determinants of Health) assessments performed: Yes See Care Plan activities for detailed interventions related to SDOH)  SDOH Interventions     Most Recent Value  SDOH Interventions  Financial Strain Interventions Other (Comment)  [manufacturer assistance]       Outpatient Encounter Medications as of 03/15/2020  Medication Sig Note  . ASPIRIN LOW DOSE 81 MG EC tablet TAKE 1 TABLET BY MOUTH  DAILY (SWALLOW WHOLE)   . gabapentin (NEURONTIN) 100 MG capsule TAKE 1 CAPSULE BY MOUTH 3  TIMES DAILY   . insulin aspart (NOVOLOG FLEXPEN) 100 UNIT/ML FlexPen Inject 20 units up to three times daily with meals   . insulin degludec (TRESIBA FLEXTOUCH) 200 UNIT/ML FlexTouch Pen Inject 80 Units into the skin daily.   . Insulin Pen Needle 32G X 6 MM MISC Use daily with insulin pen   . Lancets (FREESTYLE) lancets 1 each by Other route 2 (two) times daily. DX 250.02   . [DISCONTINUED] empagliflozin (JARDIANCE) 25 MG TABS tablet Take 25 mg by mouth daily.   . [DISCONTINUED] furosemide (LASIX) 20 MG tablet TAKE 1 TABLET BY MOUTH  EVERY OTHER DAY   . [DISCONTINUED] losartan (COZAAR) 50 MG tablet TAKE 1 TABLET BY  MOUTH  DAILY   . blood glucose meter kit and supplies Dispense based on patient and insurance preference. Use up to four times daily as directed. (FOR ICD-10 E10.9, E11.9). Use to check blood sugar three times daily   . Continuous Blood Gluc Sensor (FREESTYLE LIBRE 2 SENSOR) MISC Use to check sugar at least 4 times daily   . cyclobenzaprine (FLEXERIL) 10 MG tablet Take 1 tablet (10 mg total) by mouth 3 (three) times daily as needed for muscle spasms. (Patient not taking: Reported on 02/15/2020)   . empagliflozin (JARDIANCE) 25 MG TABS tablet Take 1 tablet (25 mg total) by mouth daily.   . furosemide (LASIX) 20 MG tablet Take 1 tablet (20 mg total) by mouth every other day.   [name] HYDROcodone-acetaminophen (NORCO) 10-325 MG tablet Take 1 tablet by mouth every 6 (six) hours as needed for severe pain. (Patient not taking: Reported on 02/03/2020)   . losartan (COZAAR) 50 MG tablet Take 1 tablet (50 mg total) by mouth daily.   . NONFORMULARY OR COMPOUNDED ITEM [facility]:  Wart Cream - Cimetidine 2%, Deoxy-D-Glucose 0.2%, Fluorouracil-5 5%, Salicylic Acid, apply to the affected area 2 times daily.   [name] VERIO test strip USE TO CHECK BLOOD SUGAR 3  TIMES DAILY   . Semaglutide,0.25 or 0.5MG/DOS, (OZEMPIC, 0.25 OR 0.5 MG/DOSE,) 2 MG/1.5ML SOPN Inject 0.25 mg once weekly x 4 weeks, then inject 0.5 mg weekly thereafter   . tamsulosin (FLOMAX) 0.4 MG CAPS capsule TAKE 1 CAPSULE  BY MOUTH  DAILY   . traMADol (ULTRAM) 50 MG tablet TAKE 1 TABLET BY MOUTH  EVERY 6 HOURS AS NEEDED FOR MODERATE PAIN FOR SPINAL  STENOSIS.   . [DISCONTINUED] glipiZIDE (GLUCOTROL) 10 MG tablet TAKE 1 TABLET BY MOUTH  TWICE DAILY BEFORE MEALS   . [DISCONTINUED] Insulin Lispro Prot & Lispro (HUMALOG MIX 75/25 KWIKPEN) (75-25) 100 UNIT/ML Kwikpen INJECT SUBCUTANEOUSLY 50  UNITS IN THE MORNING AND  120 UNITS IN THE EVENING  PREMEALS 02/15/2020: 50 units QAM, 100 units QPM   No facility-administered encounter medications on file as of  03/15/2020.     Objective:     Goals Addressed              This Visit's Progress     Patient Stated   .  [name] "I am worried about costs on Medicare" (pt-stated)        CARE PLAN ENTRY (see longitudinal plan of care for additional care plan information)  Current Barriers:  . Social, financial, and community barriers:  o None at this time. Reports that his wife's health is improving and she has been given clearance to return to work o Does report wanting to transition all medications to [employer] . Diabetes: uncontrolled, complicated by chronic medical conditions including HTN, HLD, OA, most recent A1c 8.3% . Current antihyperglycemic regimen: Humalog 75/25 25 units QAM, 100 units QPM; Jardiance 25 mg daily, Ozempic 0.5 mg -> plan to switch to [place] U200 and Novolog  o APPROVED for [employer], Tresiba, and Novolog through [employer] through 07/14/20 o Confirms affordability of Jardiance at this time . Current glucose readings: Free Style Libre 2 CGM Date of Download: 8/19-03/15/20 % Time CGM is active: 92% Average Glucose: 151 mg/dL Glucose Management Indicator: 6.9% Glucose Variability: 32.5% (goal <36%) Time in Goal:  - Time in range 70-180: 73% - Time above range: 24% - Time below range: 1% Observed patterns: Elevated post prandials . Cardiovascular risk reduction: o Current hypertensive regimen: losartan 50 mg daily, furosemide 20 mg daily o Current hyperlipidemia regimen: none, ASCVD risk 29%, reported hx statin intolerance, but name of medication unknown. Last LDL significantly elevated at 173 o Current antiplatelet regimen: ASA 81 mg daily  . Neuropathy: gabapentin 100 mg TID, reports that he does not feel this is sufficient, wonders if he should discuss increasing dose w/ Dr. Derrel Ross  Pharmacist Clinical Goal(s):  [name] Over the next 90 days, patient will work with [name] and primary care provider to address optimized medication  management  Interventions: . Comprehensive medication review performed, medication list updated in electronic medical record . Inter-disciplinary care team collaboration (see longitudinal plan of care) . Reviewed glucose readings with patient. Currently taking total 125 units of 75/25 daily. 75% of 125 is ~93, 25% is ~30 units.  . Stop Humalog 75/25. Stop glipizide. Start Tresiba U200 80 units daily (dose reduction d/t/ improved potency than NPH due to longer half life). Start Novolog 15 units up to TID with meals. Continue Jardiance 25 mg daily.  . Sent refills to [facility] on furosemide, Jardiance, and losartan. Will collaborate w/ Dr. Derrel Ross to send refills on gabapentin, tamsulosin to [facility] mail order . Reviewed importance of lipid control for ASCVD risk reduction. Discussed statin intolerance. Discussed that next steps would be injectable PCSK9i therapy. Patient tentatively amenable. Would like to discuss w/ Dr. Derrel Ross at next appt. Can pursue [employer] assistance for cost support. . Patient to schedule f/u with Dr. Derrel Ross in  the next few weeks after reviewing his wife's schedule.   Patient Self Care Activities:  . Patient will scan glucose at least QID, document, and provide at future appointments . Patient will take medications as prescribed . Patient will report any questions or concerns to provider   Please see past updates related to this goal by clicking on the "Past Updates" button in the selected goal          Plan:  - Scheduled f/u call in ~ 6 weeks. Will follow for patient's appt w/ PCP and consider changing to be co-visit  [name], [name], [place], [place] Pharmacist [place] [phone]

## 2020-03-20 DIAGNOSIS — E1165 Type 2 diabetes mellitus with hyperglycemia: Secondary | ICD-10-CM | POA: Diagnosis not present

## 2020-03-21 ENCOUNTER — Ambulatory Visit: Payer: Medicare HMO | Admitting: Pharmacist

## 2020-03-21 DIAGNOSIS — E1165 Type 2 diabetes mellitus with hyperglycemia: Secondary | ICD-10-CM

## 2020-03-21 NOTE — Chronic Care Management (AMB) (Signed)
Chronic Care Management   Follow Up Note   03/21/2020 Name: ARLAN BIRKS MRN: [id number] DOB: 1966/05/09  Referred by: [name], MD Reason for referral : Chronic Care Management (Medication Management)   RAYHAN GROLEAU is a 54 y.o. year old male who is a primary care patient of [name], [name], MD. The CCM team was consulted for assistance with chronic disease management and care coordination needs.    Received call from patient with medication management concerns.   Review of patient status, including review of consultants reports, relevant laboratory and other test results, and collaboration with appropriate care team members and the patient's provider was performed as part of comprehensive patient evaluation and provision of chronic care management services.    SDOH (Social Determinants of Health) assessments performed: No See Care Plan activities for detailed interventions related to [facility])     Outpatient Encounter Medications as of 03/21/2020  Medication Sig  . ASPIRIN LOW DOSE 81 MG EC tablet TAKE 1 TABLET BY MOUTH  DAILY (SWALLOW WHOLE)  . blood glucose meter kit and supplies Dispense based on patient and insurance preference. Use up to four times daily as directed. (FOR ICD-10 E10.9, E11.9). Use to check blood sugar three times daily  . Continuous Blood Gluc Sensor (FREESTYLE LIBRE 2 SENSOR) MISC Use to check sugar at least 4 times daily  . cyclobenzaprine (FLEXERIL) 10 MG tablet Take 1 tablet (10 mg total) by mouth 3 (three) times daily as needed for muscle spasms. (Patient not taking: Reported on 02/15/2020)  . empagliflozin (JARDIANCE) 25 MG TABS tablet Take 1 tablet (25 mg total) by mouth daily.  . furosemide (LASIX) 20 MG tablet Take 1 tablet (20 mg total) by mouth every other day.  . gabapentin (NEURONTIN) 100 MG capsule TAKE 1 CAPSULE BY MOUTH 3  TIMES DAILY  . HYDROcodone-acetaminophen (NORCO) 10-325 MG tablet Take 1 tablet by mouth every 6 (six) hours  as needed for severe pain. (Patient not taking: Reported on 02/03/2020)  . insulin aspart (NOVOLOG FLEXPEN) 100 UNIT/ML FlexPen Inject up to 20 units up to three times daily with meals  . insulin degludec (TRESIBA FLEXTOUCH) 200 UNIT/ML FlexTouch Pen Inject 80 Units into the skin daily.  . Insulin Pen Needle 32G X 6 MM MISC Use daily with insulin pen  . Lancets (FREESTYLE) lancets 1 each by Other route 2 (two) times daily. DX 250.02  . losartan (COZAAR) 50 MG tablet Take 1 tablet (50 mg total) by mouth daily.  . NONFORMULARY OR COMPOUNDED ITEM [facility]:  Wart Cream - Cimetidine 2%, Deoxy-D-Glucose 0.2%, Fluorouracil-5 5%, Salicylic Acid, apply to the affected area 2 times daily.  [name] VERIO test strip USE TO CHECK BLOOD SUGAR 3  TIMES DAILY  . Semaglutide,0.25 or 0.5MG/DOS, (OZEMPIC, 0.25 OR 0.5 MG/DOSE,) 2 MG/1.5ML SOPN Inject 0.25 mg once weekly x 4 weeks, then inject 0.5 mg weekly thereafter  . tamsulosin (FLOMAX) 0.4 MG CAPS capsule TAKE 1 CAPSULE BY MOUTH  DAILY  . traMADol (ULTRAM) 50 MG tablet TAKE 1 TABLET BY MOUTH  EVERY 6 HOURS AS NEEDED FOR MODERATE PAIN FOR SPINAL  STENOSIS.   No facility-administered encounter medications on file as of 03/21/2020.     Objective:   Goals Addressed              This Visit's Progress     Patient Stated   .  [name] "I am worried about costs on Medicare" (pt-stated)  CARE PLAN ENTRY (see longitudinal plan of care for additional care plan information)  Current Barriers:  . Social, financial, and community barriers:  o Calls today with concerns about hyperglycemia over the weekend . Diabetes: uncontrolled, complicated by chronic medical conditions including HTN, HLD, OA, most recent A1c 8.3% . Current antihyperglycemic regimen: Tresiba U200 80 units QAM, Novolog 15 units TID with meals; Jardiance 25 mg daily, Ozempic 0.5 mg o APPROVED for Ozempic, Tresiba, and Novolog through [employer] through 07/14/20 o Confirms  affordability of Jardiance at this time . Current glucose readings: Free Style Libre 2 CGM. Patient reports fastings readings have been ~140-150s, but post prandials up to >200s  . Cardiovascular risk reduction: o Current hypertensive regimen: losartan 50 mg daily, furosemide 20 mg daily o Current hyperlipidemia regimen: none, ASCVD risk 29%, reported hx statin intolerance, but name of medication unknown. Last LDL significantly elevated at 173 o Current antiplatelet regimen: ASA 81 mg daily  . Neuropathy: gabapentin 100 mg TID, reports that he does not feel this is sufficient, wonders if he should discuss increasing dose w/ Dr. [name]  Pharmacist Clinical Goal(s):  [name] Over the next 90 days, patient will work with [name] and primary care provider to address optimized medication management  Interventions: . Comprehensive medication review performed, medication list updated in electronic medical record . Inter-disciplinary care team collaboration (see longitudinal plan of care) . Given relatively higher post prandials, increase Novolog to 20 units TID with meals. Continue Tresiba 80 units daily, Ozempic 0.5 mg weekly, Jardiance 25 mg daily. Reiterated differenced between [place] + Novolog vs Humanlog 75/25. Patient will call me on Friday if significant elevations w/ post prandials persist.   Patient Self Care Activities:  . Patient will scan glucose at least QID, document, and provide at future appointments . Patient will take medications as prescribed . Patient will report any questions or concerns to provider   Please see past updates related to this goal by clicking on the "Past Updates" button in the selected goal          Plan:  - Will outreach as previously scheduled  [name], [name], [place], [place] Pharmacist [place] [phone]

## 2020-03-21 NOTE — Patient Instructions (Signed)
Visit Information  Goals Addressed              This Visit's Progress     Patient Stated   .  [name] "I am worried about costs on Medicare" (pt-stated)        CARE PLAN ENTRY (see longitudinal plan of care for additional care plan information)  Current Barriers:  . Social, financial, and community barriers:  o Calls today with concerns about hyperglycemia over the weekend . Diabetes: uncontrolled, complicated by chronic medical conditions including HTN, HLD, OA, most recent A1c 8.3% . Current antihyperglycemic regimen: Tresiba U200 80 units QAM, Novolog 15 units TID with meals; Jardiance 25 mg daily, Ozempic 0.5 mg o APPROVED for Ozempic, Tresiba, and Novolog through [employer] through 07/14/20 o Confirms affordability of Jardiance at this time . Current glucose readings: Free Style Libre 2 CGM. Patient reports fastings readings have been ~140-150s, but post prandials up to >200s  . Cardiovascular risk reduction: o Current hypertensive regimen: losartan 50 mg daily, furosemide 20 mg daily o Current hyperlipidemia regimen: none, ASCVD risk 29%, reported hx statin intolerance, but name of medication unknown. Last LDL significantly elevated at 173 o Current antiplatelet regimen: ASA 81 mg daily  . Neuropathy: gabapentin 100 mg TID, reports that he does not feel this is sufficient, wonders if he should discuss increasing dose w/ Dr. [name]  Pharmacist Clinical Goal(s):  [name] Over the next 90 days, patient will work with [name] and primary care provider to address optimized medication management  Interventions: . Comprehensive medication review performed, medication list updated in electronic medical record . Inter-disciplinary care team collaboration (see longitudinal plan of care) . Given relatively higher post prandials, increase Novolog to 20 units TID with meals. Continue Tresiba 80 units daily, Ozempic 0.5 mg weekly, Jardiance 25 mg daily. Reiterated differenced between [place] +  Novolog vs Humanlog 75/25. Patient will call me on Friday if significant elevations w/ post prandials persist.   Patient Self Care Activities:  . Patient will scan glucose at least QID, document, and provide at future appointments . Patient will take medications as prescribed . Patient will report any questions or concerns to provider   Please see past updates related to this goal by clicking on the "Past Updates" button in the selected goal         The patient verbalized understanding of instructions provided today and declined a print copy of patient instruction materials.   Plan:  - Will outreach as previously scheduled  [name], [name], [place], [place] Pharmacist [place] [phone]

## 2020-03-24 ENCOUNTER — Ambulatory Visit: Payer: Medicare HMO | Admitting: Pharmacist

## 2020-03-24 ENCOUNTER — Other Ambulatory Visit: Payer: Self-pay

## 2020-03-24 DIAGNOSIS — E1165 Type 2 diabetes mellitus with hyperglycemia: Secondary | ICD-10-CM

## 2020-03-24 DIAGNOSIS — M5416 Radiculopathy, lumbar region: Secondary | ICD-10-CM

## 2020-03-24 DIAGNOSIS — M48061 Spinal stenosis, lumbar region without neurogenic claudication: Secondary | ICD-10-CM

## 2020-03-24 MED ORDER — NOVOLOG FLEXPEN 100 UNIT/ML ~~LOC~~ SOPN: PEN_INJECTOR | SUBCUTANEOUS | 2 refills | Status: DC

## 2020-03-24 MED ORDER — TRESIBA FLEXTOUCH 200 UNIT/ML ~~LOC~~ SOPN: 88.0000 [IU] | PEN_INJECTOR | Freq: Every day | SUBCUTANEOUS | 2 refills | Status: DC

## 2020-03-24 MED ORDER — TAMSULOSIN HCL 0.4 MG PO CAPS: 0.4000 mg | ORAL_CAPSULE | Freq: Every day | ORAL | 3 refills | Status: DC

## 2020-03-24 MED ORDER — GABAPENTIN 100 MG PO CAPS
100.0000 mg | ORAL_CAPSULE | Freq: Three times a day (TID) | ORAL | 3 refills | Status: DC
Start: 2020-03-24 — End: 2021-03-22

## 2020-03-24 NOTE — Chronic Care Management (AMB) (Signed)
Chronic Care Management   Follow Up Note   03/24/2020 Name: Connor Ross MRN: [id number] DOB: [birth year]  Referred by: [name], MD Reason for referral : Chronic Care Management (Medication Management)   Connor Ross is a 54 y.o. year old male who is a primary care patient of [name], [name], MD. The CCM team was consulted for assistance with chronic disease management and care coordination needs.    Received call from patient today regarding elevated glucose readings.  Review of patient status, including review of consultants reports, relevant laboratory and other test results, and collaboration with appropriate care team members and the patient's provider was performed as part of comprehensive patient evaluation and provision of chronic care management services.    SDOH (Social Determinants of Health) assessments performed: No See Care Plan activities for detailed interventions related to [facility])     Outpatient Encounter Medications as of 03/24/2020  Medication Sig  . ASPIRIN LOW DOSE 81 MG EC tablet TAKE 1 TABLET BY MOUTH  DAILY (SWALLOW WHOLE)  . blood glucose meter kit and supplies Dispense based on patient and insurance preference. Use up to four times daily as directed. (FOR ICD-10 E10.9, E11.9). Use to check blood sugar three times daily  . Continuous Blood Gluc Sensor (FREESTYLE LIBRE 2 SENSOR) MISC Use to check sugar at least 4 times daily  . cyclobenzaprine (FLEXERIL) 10 MG tablet Take 1 tablet (10 mg total) by mouth 3 (three) times daily as needed for muscle spasms. (Patient not taking: Reported on 02/15/2020)  . empagliflozin (JARDIANCE) 25 MG TABS tablet Take 1 tablet (25 mg total) by mouth daily.  . furosemide (LASIX) 20 MG tablet Take 1 tablet (20 mg total) by mouth every other day.  . gabapentin (NEURONTIN) 100 MG capsule Take 1 capsule (100 mg total) by mouth 3 (three) times daily.  [name] HYDROcodone-acetaminophen (NORCO) 10-325 MG tablet Take 1 tablet  by mouth every 6 (six) hours as needed for severe pain. (Patient not taking: Reported on 02/03/2020)  . insulin aspart (NOVOLOG FLEXPEN) 100 UNIT/ML FlexPen Inject up to 20 units up to three times daily with meals  . insulin degludec (TRESIBA FLEXTOUCH) 200 UNIT/ML FlexTouch Pen Inject 80 Units into the skin daily.  . Insulin Pen Needle 32G X 6 MM MISC Use daily with insulin pen  . Lancets (FREESTYLE) lancets 1 each by Other route 2 (two) times daily. DX 250.02  . losartan (COZAAR) 50 MG tablet Take 1 tablet (50 mg total) by mouth daily.  . NONFORMULARY OR COMPOUNDED ITEM [facility]:  Wart Cream - Cimetidine 2%, Deoxy-D-Glucose 0.2%, Fluorouracil-5 5%, Salicylic Acid, apply to the affected area 2 times daily.  [name] VERIO test strip USE TO CHECK BLOOD SUGAR 3  TIMES DAILY  . Semaglutide,0.25 or 0.5MG/DOS, (OZEMPIC, 0.25 OR 0.5 MG/DOSE,) 2 MG/1.5ML SOPN Inject 0.25 mg once weekly x 4 weeks, then inject 0.5 mg weekly thereafter  . tamsulosin (FLOMAX) 0.4 MG CAPS capsule Take 1 capsule (0.4 mg total) by mouth daily.  . traMADol (ULTRAM) 50 MG tablet TAKE 1 TABLET BY MOUTH  EVERY 6 HOURS AS NEEDED FOR MODERATE PAIN FOR SPINAL  STENOSIS.   No facility-administered encounter medications on file as of 03/24/2020.     Objective:   Goals Addressed              This Visit's Progress     Patient Stated   .  [name] "I am worried about costs on Medicare" (pt-stated)  CARE PLAN ENTRY (see longitudinal plan of care for additional care plan information)  Current Barriers:  . Social, financial, and community barriers:  o Calls today to report glucose readings  . Diabetes: uncontrolled, complicated by chronic medical conditions including HTN, HLD, OA, most recent A1c 8.3% . Current antihyperglycemic regimen: Tresiba U200 80 units QAM, Novolog 20 units TID with meals; Jardiance 25 mg daily, Ozempic 0.5 mg o APPROVED for Ozempic, Tresiba, and Novolog through [employer] through  07/14/20 o Confirms affordability of Jardiance at this time . Current glucose readings: Free Style Libre 2 CGM.  o Fastings: 120-140sPatient reports fastings readings have been ~140-150s, but post prandials up to >200s  . Cardiovascular risk reduction: o Current hypertensive regimen: losartan 50 mg daily, furosemide 20 mg daily o Current hyperlipidemia regimen: none, ASCVD risk 29%, reported hx statin intolerance, but name of medication unknown. Last LDL significantly elevated at 173 o Current antiplatelet regimen: ASA 81 mg daily  . Neuropathy: gabapentin 100 mg TID, reports that he does not feel this is sufficient, wonders if he should discuss increasing dose w/ Dr. [name]  Pharmacist Clinical Goal(s):  [name] Over the next 90 days, patient will work with [name] and primary care provider to address optimized medication management  Interventions: . Reviewed readings. Increase Tresiba to 86 units daily. Increase Novolog to 25 units TID. Patient verbalized understanding.   Patient Self Care Activities:  . Patient will scan glucose at least QID, document, and provide at future appointments . Patient will take medications as prescribed . Patient will report any questions or concerns to provider   Please see past updates related to this goal by clicking on the "Past Updates" button in the selected goal          Plan:  - Will f/u as previously scheduled  [name], [name], [place], [place] Pharmacist [place] [phone]

## 2020-03-24 NOTE — Telephone Encounter (Signed)
Refilled: 08/16/2019 Last OV: 01/05/2020 Next OV: not scheduled

## 2020-03-24 NOTE — Patient Instructions (Signed)
Visit Information  Goals Addressed              This Visit's Progress     Patient Stated   .  [name] "I am worried about costs on Medicare" (pt-stated)        CARE PLAN ENTRY (see longitudinal plan of care for additional care plan information)  Current Barriers:  . Social, financial, and community barriers:  o Calls today to report glucose readings  . Diabetes: uncontrolled, complicated by chronic medical conditions including HTN, HLD, OA, most recent A1c 8.3% . Current antihyperglycemic regimen: Tresiba U200 80 units QAM, Novolog 20 units TID with meals; Jardiance 25 mg daily, Ozempic 0.5 mg o APPROVED for Ozempic, Tresiba, and Novolog through [employer] through 07/14/20 o Confirms affordability of Jardiance at this time . Current glucose readings: Free Style Libre 2 CGM.  o Fastings: 120-140sPatient reports fastings readings have been ~140-150s, but post prandials up to >200s  . Cardiovascular risk reduction: o Current hypertensive regimen: losartan 50 mg daily, furosemide 20 mg daily o Current hyperlipidemia regimen: none, ASCVD risk 29%, reported hx statin intolerance, but name of medication unknown. Last LDL significantly elevated at 173 o Current antiplatelet regimen: ASA 81 mg daily  . Neuropathy: gabapentin 100 mg TID, reports that he does not feel this is sufficient, wonders if he should discuss increasing dose w/ Dr. [name]  Pharmacist Clinical Goal(s):  [name] Over the next 90 days, patient will work with [name] and primary care provider to address optimized medication management  Interventions: . Reviewed readings. Increase Tresiba to 86 units daily. Increase Novolog to 25 units TID. Patient verbalized understanding.   Patient Self Care Activities:  . Patient will scan glucose at least QID, document, and provide at future appointments . Patient will take medications as prescribed . Patient will report any questions or concerns to provider   Please see past updates  related to this goal by clicking on the "Past Updates" button in the selected goal         The patient verbalized understanding of instructions provided today and declined a print copy of patient instruction materials.  Plan:  - Will f/u as previously scheduled  [name], [name], [place], [place] Pharmacist [place] [phone]

## 2020-03-25 MED ORDER — TRAMADOL HCL 50 MG PO TABS: 50.0000 mg | ORAL_TABLET | Freq: Four times a day (QID) | ORAL | 2 refills | Status: DC | PRN

## 2020-04-13 ENCOUNTER — Other Ambulatory Visit: Payer: Self-pay

## 2020-04-17 ENCOUNTER — Ambulatory Visit (INDEPENDENT_AMBULATORY_CARE_PROVIDER_SITE_OTHER): Payer: Medicare HMO | Admitting: Pharmacist

## 2020-04-17 ENCOUNTER — Telehealth: Payer: Self-pay | Admitting: Internal Medicine

## 2020-04-17 ENCOUNTER — Other Ambulatory Visit: Payer: Self-pay

## 2020-04-17 DIAGNOSIS — E785 Hyperlipidemia, unspecified: Secondary | ICD-10-CM

## 2020-04-17 DIAGNOSIS — I152 Hypertension secondary to endocrine disorders: Secondary | ICD-10-CM

## 2020-04-17 DIAGNOSIS — E1165 Type 2 diabetes mellitus with hyperglycemia: Secondary | ICD-10-CM

## 2020-04-17 MED ORDER — NOVOLOG FLEXPEN 100 UNIT/ML ~~LOC~~ SOPN: PEN_INJECTOR | SUBCUTANEOUS | 2 refills | Status: AC

## 2020-04-17 MED ORDER — REPATHA 140 MG/ML ~~LOC~~ SOSY: 140.0000 mg | PREFILLED_SYRINGE | SUBCUTANEOUS | 2 refills | Status: DC

## 2020-04-17 MED ORDER — OZEMPIC (1 MG/DOSE) 2 MG/1.5ML ~~LOC~~ SOPN: 1.0000 mg | PEN_INJECTOR | SUBCUTANEOUS | 2 refills | Status: DC

## 2020-04-17 NOTE — Telephone Encounter (Signed)
Patient need order for fasting labs on 05-04-20

## 2020-04-17 NOTE — Patient Instructions (Addendum)
Connor Ross,   Keep up the great work!  INCREASE Ozempic to 1 mg weekly. While we wait on the 1 mg pen strength to arrive from [employer], you can do 2 injections of 0.5 mg every week. You can do this one after the other.   Continue Tresiba 86 units daily.   Continue Novolog 25 units with breakfast or with lunch. Increase Novolog to 30 units with supper.   I have sent a script for Repatha to your pharmacy. I will likely need to complete a Prior Authorization with your insurance. We will see what your copay will be with your plan. If it is too expensive, we can try for patient assistance through the drug company. You signed that application form today, just in case.   I went ahead and scheduled a follow up face to face with me to download your CGM and review in December. Let me know if that doesn't work and we need to reschedule.   As always, call with any questions or concerns!  [name], [name] [phone]  Visit Information  Goals Addressed              This Visit's Progress     Patient Stated   .  [name] "I am worried about costs on Medicare" (pt-stated)        CARE PLAN ENTRY (see longitudinal plan of care for additional care plan information)  Current Barriers:  . Social, financial, and community barriers:  o None noted today. Appreciates assistance w/ injectable medication costs from [employer] patient assistance . Diabetes: uncontrolled, complicated by chronic medical conditions including HTN, HLD, OA, most recent A1c 8.3% . Current antihyperglycemic regimen: Tresiba U200 86 units QAM, Novolog 25 units TID with meals- usually eating breakfast or lunch most days; Jardiance 25 mg daily, Ozempic 0.5 mg o APPROVED for Ozempic, Tresiba, and Novolog through [employer] through 07/14/20 o Confirms affordability of Jardiance at this time . Current glucose readings: Free Style Libre 2 CGM.  Date of Download: 9/21-10/4/21 % Time CGM is active: 98% Average  Glucose: 169 mg/dL Glucose Management Indicator: 7.4% Glucose Variability: 24.2% (goal <36%) Time in Goal:  - Time in range 70-180: 67% - Time above range: % - Time below range: 0% Observed patterns: elevated post supper readings . Cardiovascular risk reduction: o Current hypertensive regimen: losartan 50 mg daily, furosemide 20 mg daily o Current hyperlipidemia regimen: none, ASCVD risk 29%, reported hx statin intolerance. Last LDL significantly elevated at 173. Patient notes that his sister is on [place], and he is interested in starting therapy today. o Current antiplatelet regimen: ASA 81 mg daily  . Neuropathy: gabapentin 100 mg TID  Pharmacist Clinical Goal(s):  [name] Over the next 90 days, patient will work with [name] and primary care provider to address optimized medication management  Interventions: . Comprehensive medication review performed, medication list updated in electronic medical record . Inter-disciplinary care team collaboration (see longitudinal plan of care) . Reviewed CGM readings. Increase Ozempic to 1 mg weekly. Sending order to [facility]. While awaiting order from [facility], patient may inject 2 doses of 0.5 mg Ozempic weekly. Continue Tresiba 86 units daily. Continue Novolog 25 units with breakfast and lunch, but increase to 30 units with supper.  . Discussed PCSK9i. Start Repatha. PA completed. Will await response. Pending cost, will attempt to pursue coverage through [place] patient assistance, as hyperlipidemia funds (HealthWell, PAN foundation) are closed at this time.   . Patient plans to schedule PCP  f/u for later this month.  Patient Self Care Activities:  . Patient will scan glucose at least QID, document, and provide at future appointments . Patient will take medications as prescribed . Patient will report any questions or concerns to provider   Please see past updates related to this goal by clicking on the "Past Updates" button in the selected  goal         Print copy of patient instructions provided.   Plan:  - PCP f/u in ~4 weeks. Scheduled f/u face to face ~6 weeks after that  [place], [name], [place], CPP Clinical Pharmacist [place] [phone]

## 2020-04-17 NOTE — Telephone Encounter (Signed)
Pt is scheduled for labs on 05/04/2020. I have ordered Lipid panel, A1c and CMP. Is there anything else that needs to be ordered?

## 2020-04-17 NOTE — Chronic Care Management (AMB) (Signed)
Chronic Care Management   Follow Up Note   04/17/2020 Name: Connor Ross MRN: [id number] DOB: 27-Apr-1966  Referred by: [name], MD Reason for referral : Chronic Care Management (Medication Management)   Connor Ross is a 54 y.o. year old male who is a primary care patient of [name], [name], MD. The CCM team was consulted for assistance with chronic disease management and care coordination needs.    Met with patient face to face for CGM download and medication review.   Review of patient status, including review of consultants reports, relevant laboratory and other test results, and collaboration with appropriate care team members and the patient's provider was performed as part of comprehensive patient evaluation and provision of chronic care management services.    SDOH (Social Determinants of Health) assessments performed: Yes See Care Plan activities for detailed interventions related to SDOH)  SDOH Interventions     Most Recent Value  SDOH Interventions  Financial Strain Interventions Other (Comment), Intervention Not Indicated  [patient assistance]       Outpatient Encounter Medications as of 04/17/2020  Medication Sig  . empagliflozin (JARDIANCE) 25 MG TABS tablet Take 1 tablet (25 mg total) by mouth daily.  . insulin aspart (NOVOLOG FLEXPEN) 100 UNIT/ML FlexPen Inject up to 25 units with breakfast, 25 units with lunch, and 30 units with supper  . insulin degludec (TRESIBA FLEXTOUCH) 200 UNIT/ML FlexTouch Pen Inject 88 Units into the skin daily.  [name] losartan (COZAAR) 50 MG tablet Take 1 tablet (50 mg total) by mouth daily.  . [DISCONTINUED] insulin aspart (NOVOLOG FLEXPEN) 100 UNIT/ML FlexPen Inject up to 25 units up to three times daily with meals  . [DISCONTINUED] Semaglutide,0.25 or 0.5MG/DOS, (OZEMPIC, 0.25 OR 0.5 MG/DOSE,) 2 MG/1.5ML SOPN Inject 0.25 mg once weekly x 4 weeks, then inject 0.5 mg weekly thereafter  . ASPIRIN LOW DOSE 81 MG EC tablet  TAKE 1 TABLET BY MOUTH  DAILY (SWALLOW WHOLE)  . blood glucose meter kit and supplies Dispense based on patient and insurance preference. Use up to four times daily as directed. (FOR ICD-10 E10.9, E11.9). Use to check blood sugar three times daily  . Continuous Blood Gluc Sensor (FREESTYLE LIBRE 2 SENSOR) MISC Use to check sugar at least 4 times daily  . cyclobenzaprine (FLEXERIL) 10 MG tablet Take 1 tablet (10 mg total) by mouth 3 (three) times daily as needed for muscle spasms. (Patient not taking: Reported on 02/15/2020)  . Evolocumab (REPATHA) 140 MG/ML SOSY Inject 140 mg into the skin every 14 (fourteen) days.  . furosemide (LASIX) 20 MG tablet Take 1 tablet (20 mg total) by mouth every other day.  . gabapentin (NEURONTIN) 100 MG capsule Take 1 capsule (100 mg total) by mouth 3 (three) times daily.  [name] HYDROcodone-acetaminophen (NORCO) 10-325 MG tablet Take 1 tablet by mouth every 6 (six) hours as needed for severe pain. (Patient not taking: Reported on 02/03/2020)  . Insulin Pen Needle 32G X 6 MM MISC Use daily with insulin pen  . Lancets (FREESTYLE) lancets 1 each by Other route 2 (two) times daily. DX 250.02  . NONFORMULARY OR COMPOUNDED ITEM [facility]:  Wart Cream - Cimetidine 2%, Deoxy-D-Glucose 0.2%, Fluorouracil-5 5%, Salicylic Acid, apply to the affected area 2 times daily.  [name] VERIO test strip USE TO CHECK BLOOD SUGAR 3  TIMES DAILY  . Semaglutide, 1 MG/DOSE, (OZEMPIC, 1 MG/DOSE,) 2 MG/1.5ML SOPN Inject 1 mg into the skin once a week.  . tamsulosin (FLOMAX)  0.4 MG CAPS capsule Take 1 capsule (0.4 mg total) by mouth daily.  . traMADol (ULTRAM) 50 MG tablet Take 1 tablet (50 mg total) by mouth every 6 (six) hours as needed.   No facility-administered encounter medications on file as of 04/17/2020.     Objective:     Goals Addressed              This Visit's Progress     Patient Stated   .  [name] "I am worried about costs on Medicare" (pt-stated)        CARE  PLAN ENTRY (see longitudinal plan of care for additional care plan information)  Current Barriers:  . Social, financial, and community barriers:  o None noted today. Appreciates assistance w/ injectable medication costs from [employer] patient assistance . Diabetes: uncontrolled, complicated by chronic medical conditions including HTN, HLD, OA, most recent A1c 8.3% . Current antihyperglycemic regimen: Tresiba U200 86 units QAM, Novolog 25 units TID with meals- usually eating breakfast or lunch most days; Jardiance 25 mg daily, Ozempic 0.5 mg o APPROVED for Ozempic, Tresiba, and Novolog through [employer] through 07/14/20 o Confirms affordability of Jardiance at this time . Current glucose readings: Free Style Libre 2 CGM.  Date of Download: 9/21-10/4/21 % Time CGM is active: 98% Average Glucose: 169 mg/dL Glucose Management Indicator: 7.4% Glucose Variability: 24.2% (goal <36%) Time in Goal:  - Time in range 70-180: 67% - Time above range: % - Time below range: 0% Observed patterns: elevated post supper readings . Cardiovascular risk reduction: o Current hypertensive regimen: losartan 50 mg daily, furosemide 20 mg daily o Current hyperlipidemia regimen: none, ASCVD risk 29%, reported hx statin intolerance. Last LDL significantly elevated at 173. Patient notes that his sister is on [place], and he is interested in starting therapy today. o Current antiplatelet regimen: ASA 81 mg daily  . Neuropathy: gabapentin 100 mg TID  Pharmacist Clinical Goal(s):  [name] Over the next 90 days, patient will work with [name] and primary care provider to address optimized medication management  Interventions: . Comprehensive medication review performed, medication list updated in electronic medical record . Inter-disciplinary care team collaboration (see longitudinal plan of care) . Reviewed CGM readings. Increase Ozempic to 1 mg weekly. Sending order to [facility]. While awaiting order  from [facility], patient may inject 2 doses of 0.5 mg Ozempic weekly. Continue Tresiba 86 units daily. Continue Novolog 25 units with breakfast and lunch, but increase to 30 units with supper.  . Discussed PCSK9i. Start Repatha. PA completed. Will await response. Pending cost, will attempt to pursue coverage through [place] patient assistance, as hyperlipidemia funds (HealthWell, PAN foundation) are closed at this time.   . Patient plans to schedule PCP f/u for later this month.  Patient Self Care Activities:  . Patient will scan glucose at least QID, document, and provide at future appointments . Patient will take medications as prescribed . Patient will report any questions or concerns to provider   Please see past updates related to this goal by clicking on the "Past Updates" button in the selected goal          Plan:  - PCP f/u in ~4 weeks. Scheduled f/u face to face ~6 weeks after that  [place], [name], [place], CPP Clinical Pharmacist [place] [phone]

## 2020-04-18 ENCOUNTER — Ambulatory Visit: Payer: Medicare HMO | Admitting: Pharmacist

## 2020-04-18 DIAGNOSIS — E1165 Type 2 diabetes mellitus with hyperglycemia: Secondary | ICD-10-CM

## 2020-04-18 DIAGNOSIS — M791 Myalgia, unspecified site: Secondary | ICD-10-CM

## 2020-04-18 DIAGNOSIS — E785 Hyperlipidemia, unspecified: Secondary | ICD-10-CM

## 2020-04-18 DIAGNOSIS — T466X5A Adverse effect of antihyperlipidemic and antiarteriosclerotic drugs, initial encounter: Secondary | ICD-10-CM

## 2020-04-18 MED ORDER — ROSUVASTATIN CALCIUM 5 MG PO TABS: ORAL_TABLET | ORAL | 2 refills | Status: DC

## 2020-04-18 NOTE — Chronic Care Management (AMB) (Signed)
Chronic Care Management   Follow Up Note   04/18/2020 Name: Connor Ross MRN: [id number] DOB: 19-Jul-1965  Referred by: [name], MD Reason for referral : Chronic Care Management (Medication Management)   Connor Ross is a 54 y.o. year old male who is a primary care patient of [name], [name], MD. The CCM team was consulted for assistance with chronic disease management and care coordination needs.    Contacted patient for follow up on medication access.  Review of patient status, including review of consultants reports, relevant laboratory and other test results, and collaboration with appropriate care team members and the patient's provider was performed as part of comprehensive patient evaluation and provision of chronic care management services.    SDOH (Social Determinants of Health) assessments performed: No See Care Plan activities for detailed interventions related to [facility])     Outpatient Encounter Medications as of 04/18/2020  Medication Sig   ASPIRIN LOW DOSE 81 MG EC tablet TAKE 1 TABLET BY MOUTH  DAILY (SWALLOW WHOLE)   blood glucose meter kit and supplies Dispense based on patient and insurance preference. Use up to four times daily as directed. (FOR ICD-10 E10.9, E11.9). Use to check blood sugar three times daily   Continuous Blood Gluc Sensor (FREESTYLE LIBRE 2 SENSOR) MISC Use to check sugar at least 4 times daily   cyclobenzaprine (FLEXERIL) 10 MG tablet Take 1 tablet (10 mg total) by mouth 3 (three) times daily as needed for muscle spasms. (Patient not taking: Reported on 02/15/2020)   empagliflozin (JARDIANCE) 25 MG TABS tablet Take 1 tablet (25 mg total) by mouth daily.   Evolocumab (REPATHA) 140 MG/ML SOSY Inject 140 mg into the skin every 14 (fourteen) days.   furosemide (LASIX) 20 MG tablet Take 1 tablet (20 mg total) by mouth every other day.   gabapentin (NEURONTIN) 100 MG capsule Take 1 capsule (100 mg total) by mouth 3 (three)  times daily.   HYDROcodone-acetaminophen (NORCO) 10-325 MG tablet Take 1 tablet by mouth every 6 (six) hours as needed for severe pain. (Patient not taking: Reported on 02/03/2020)   insulin aspart (NOVOLOG FLEXPEN) 100 UNIT/ML FlexPen Inject up to 25 units with breakfast, 25 units with lunch, and 30 units with supper   insulin degludec (TRESIBA FLEXTOUCH) 200 UNIT/ML FlexTouch Pen Inject 88 Units into the skin daily.   Insulin Pen Needle 32G X 6 MM MISC Use daily with insulin pen   Lancets (FREESTYLE) lancets 1 each by Other route 2 (two) times daily. DX 250.02   losartan (COZAAR) 50 MG tablet Take 1 tablet (50 mg total) by mouth daily.   NONFORMULARY OR COMPOUNDED ITEM [facility]:  Wart Cream - Cimetidine 2%, Deoxy-D-Glucose 0.2%, Fluorouracil-5 5%, Salicylic Acid, apply to the affected area 2 times daily.   ONETOUCH VERIO test strip USE TO CHECK BLOOD SUGAR 3  TIMES DAILY   rosuvastatin (CRESTOR) 5 MG tablet Take 1 tablet three days per week (ie Monday, Wednesday, and Friday)   Semaglutide, 1 MG/DOSE, (OZEMPIC, 1 MG/DOSE,) 2 MG/1.5ML SOPN Inject 1 mg into the skin once a week.   tamsulosin (FLOMAX) 0.4 MG CAPS capsule Take 1 capsule (0.4 mg total) by mouth daily.   traMADol (ULTRAM) 50 MG tablet Take 1 tablet (50 mg total) by mouth every 6 (six) hours as needed.   No facility-administered encounter medications on file as of 04/18/2020.     Objective:   Goals Addressed  This Visit's Progress     Patient Stated     [name] "I am worried about costs on Medicare" (pt-stated)        CARE PLAN ENTRY (see longitudinal plan of care for additional care plan information)  Current Barriers:   Social, financial, and community barriers:  o PA for Repatha denied as we only have documentation of pravastatin. [employer] requires that patient must have "determined to have statin-associated muscle symptoms (SAMs) due to rhabdomyolysis or failure to achieve goal LDL-C  decrease because of SAMs despite both lowering of statin strength and attempting a different statin"  Diabetes: uncontrolled, complicated by chronic medical conditions including HTN, HLD, OA, most recent A1c 8.3%  Current antihyperglycemic regimen: Tresiba U200 86 units QAM, Novolog 25 units breakfast/lunch, 30 units supper, Jardiance 25 mg daily, Ozempic 1 mg o APPROVED for Ozempic, Tresiba, and Novolog through [employer] through 07/14/20 o Confirms affordability of Jardiance at this time  Cardiovascular risk reduction: o Current hypertensive regimen: losartan 50 mg daily, furosemide 20 mg daily o Current hyperlipidemia regimen: none, ASCVD risk 29%, reported hx statin intolerance. Last LDL significantly elevated at 173. Noted hx of pravastatin in the EMR. Patient does not remember the names atorvastatin, rosuvastatin, or simvastatin o Current antiplatelet regimen: ASA 81 mg daily   Neuropathy: gabapentin 100 mg TID  Pharmacist Clinical Goal(s):   Over the next 90 days, patient will work with [name] and primary care provider to address optimized medication management  Interventions:  Contacted [facility]. The only hx they have the patient filling is pravastatin 40 mg daily.   Discussed with patient. Discussed lower myalgia potential of rosuvastatin. Patient is amenable to try rosuvastatin 5 mg three days per week. Script sent. He will contact me if he develops myalgias.   Patient Self Care Activities:   Patient will scan glucose at least QID, document, and provide at future appointments  Patient will take medications as prescribed  Patient will report any questions or concerns to provider   Please see past updates related to this goal by clicking on the "Past Updates" button in the selected goal          Plan:  - Will outreach patient as previously scheduled  [name], [name], [place], [place] Pharmacist [place] [phone]

## 2020-04-18 NOTE — Patient Instructions (Signed)
Visit Information  Goals Addressed              This Visit's Progress     Patient Stated   .  [name] "I am worried about costs on Medicare" (pt-stated)        CARE PLAN ENTRY (see longitudinal plan of care for additional care plan information)  Current Barriers:  . Social, financial, and community barriers:  o PA for Repatha denied as we only have documentation of pravastatin. [employer] requires that patient must have "determined to have statin-associated muscle symptoms (SAMs) due to rhabdomyolysis or failure to achieve goal LDL-C decrease because of SAMs despite both lowering of statin strength and attempting a different statin" . Diabetes: uncontrolled, complicated by chronic medical conditions including HTN, HLD, OA, most recent A1c 8.3% . Current antihyperglycemic regimen: Tresiba U200 86 units QAM, Novolog 25 units breakfast/lunch, 30 units supper, Jardiance 25 mg daily, Ozempic 1 mg o APPROVED for Ozempic, Tresiba, and Novolog through [employer] through 07/14/20 o Confirms affordability of Jardiance at this time . Cardiovascular risk reduction: o Current hypertensive regimen: losartan 50 mg daily, furosemide 20 mg daily o Current hyperlipidemia regimen: none, ASCVD risk 29%, reported hx statin intolerance. Last LDL significantly elevated at 173. Noted hx of pravastatin in the EMR. Patient does not remember the names atorvastatin, rosuvastatin, or simvastatin o Current antiplatelet regimen: ASA 81 mg daily  . Neuropathy: gabapentin 100 mg TID  Pharmacist Clinical Goal(s):  [name] Over the next 90 days, patient will work with [name] and primary care provider to address optimized medication management  Interventions: . Contacted [facility]. The only hx they have the patient filling is pravastatin 40 mg daily.  . Discussed with patient. Discussed lower myalgia potential of rosuvastatin. Patient is amenable to try rosuvastatin 5 mg three days per week. Script sent. He will contact me if he  develops myalgias.   Patient Self Care Activities:  . Patient will scan glucose at least QID, document, and provide at future appointments . Patient will take medications as prescribed . Patient will report any questions or concerns to provider   Please see past updates related to this goal by clicking on the "Past Updates" button in the selected goal         The patient verbalized understanding of instructions provided today and declined a print copy of patient instruction materials.  Plan:  - Will outreach patient as previously scheduled  [name], [name], [place], [place] Pharmacist [place] [phone]

## 2020-04-20 DIAGNOSIS — E1165 Type 2 diabetes mellitus with hyperglycemia: Secondary | ICD-10-CM | POA: Diagnosis not present

## 2020-04-20 NOTE — Progress Notes (Signed)
I have collaborated with the care management provider regarding care management and care coordination activities outlined in this encounter and have reviewed this encounter including documentation in the note and care plan. I am certifying that I agree with the content of this note and encounter as supervising physician.  Regards,   Rosemae Mcquown, MD    

## 2020-04-20 NOTE — Progress Notes (Signed)
I have collaborated with the care management provider regarding care management and care coordination activities outlined in this encounter and have reviewed this encounter including documentation in the note and care plan. I am certifying that I agree with the content of this note and encounter as supervising physician.  Regards,   Latrina Guttman, MD    

## 2020-05-04 ENCOUNTER — Other Ambulatory Visit (INDEPENDENT_AMBULATORY_CARE_PROVIDER_SITE_OTHER): Payer: Medicare HMO

## 2020-05-04 ENCOUNTER — Other Ambulatory Visit: Payer: Self-pay

## 2020-05-04 ENCOUNTER — Telehealth: Payer: Self-pay

## 2020-05-04 DIAGNOSIS — E785 Hyperlipidemia, unspecified: Secondary | ICD-10-CM | POA: Diagnosis not present

## 2020-05-04 DIAGNOSIS — I152 Hypertension secondary to endocrine disorders: Secondary | ICD-10-CM | POA: Diagnosis not present

## 2020-05-04 DIAGNOSIS — M546 Pain in thoracic spine: Secondary | ICD-10-CM

## 2020-05-04 DIAGNOSIS — E1165 Type 2 diabetes mellitus with hyperglycemia: Secondary | ICD-10-CM

## 2020-05-04 LAB — HEMOGLOBIN A1C: Hgb A1c MFr Bld: 7.9 % — ABNORMAL HIGH (ref 4.6–6.5)

## 2020-05-04 LAB — URINALYSIS, ROUTINE W REFLEX MICROSCOPIC
Bilirubin Urine: NEGATIVE
Ketones, ur: NEGATIVE
Leukocytes,Ua: NEGATIVE
Nitrite: NEGATIVE
Specific Gravity, Urine: 1.02 (ref 1.000–1.030)
Total Protein, Urine: NEGATIVE
Urine Glucose: >=1000 — AB
Urobilinogen, UA: 0.2 (ref 0.0–1.0)
pH: 6 (ref 5.0–8.0)

## 2020-05-04 LAB — LIPID PANEL
Cholesterol: 185 mg/dL (ref 0–200)
HDL: 26 mg/dL — ABNORMAL LOW (ref >39.00)
NonHDL: 158.57
Total CHOL/HDL Ratio: 7
Triglycerides: 287 mg/dL — ABNORMAL HIGH (ref 0.0–149.0)
VLDL: 57.4 mg/dL — ABNORMAL HIGH (ref 0.0–40.0)

## 2020-05-04 LAB — LDL CHOLESTEROL, DIRECT: Direct LDL: 114 mg/dL

## 2020-05-04 NOTE — Telephone Encounter (Signed)
Form has been completed and placed in quick sign folder for signature.  

## 2020-05-04 NOTE — Telephone Encounter (Signed)
Pt needs to check to see if novolog, tresiba, and ozempic has been ordered and sent to [place]. He would like a call back.

## 2020-05-05 LAB — COMPREHENSIVE METABOLIC PANEL
ALT: 18 U/L (ref 0–53)
AST: 10 U/L (ref 0–37)
Albumin: 4.3 g/dL (ref 3.5–5.2)
Alkaline Phosphatase: 72 U/L (ref 39–117)
BUN: 13 mg/dL (ref 6–23)
CO2: 28 mEq/L (ref 19–32)
Calcium: 9.1 mg/dL (ref 8.4–10.5)
Chloride: 101 mEq/L (ref 96–112)
Creatinine, Ser: 0.75 mg/dL (ref 0.40–1.50)
GFR: 106 mL/min (ref >60.00)
Glucose, Bld: 154 mg/dL — ABNORMAL HIGH (ref 70–99)
Potassium: 4.2 mEq/L (ref 3.5–5.1)
Sodium: 137 mEq/L (ref 135–145)
Total Bilirubin: 0.4 mg/dL (ref 0.2–1.2)
Total Protein: 6.6 g/dL (ref 6.0–8.3)

## 2020-05-06 NOTE — Progress Notes (Signed)
Attached are your recent fasting labs,  We will discuss in detail at your upcoming visit.  I'm not sure how or why a urinalysis was done,  but  it suggests inflammation and no urine culture was ordered so if you are having bladder symptoms this needs to be addressed at our visit.   Regards,   Deborra Medina, MD

## 2020-05-08 ENCOUNTER — Ambulatory Visit (INDEPENDENT_AMBULATORY_CARE_PROVIDER_SITE_OTHER): Payer: Medicare HMO | Admitting: Internal Medicine

## 2020-05-08 ENCOUNTER — Other Ambulatory Visit: Payer: Self-pay

## 2020-05-08 ENCOUNTER — Encounter: Payer: Self-pay | Admitting: Internal Medicine

## 2020-05-08 VITALS — BP 146/78 | HR 78 | Temp 98.7°F | Ht 71.0 in | Wt 319.2 lb

## 2020-05-08 DIAGNOSIS — I152 Hypertension secondary to endocrine disorders: Secondary | ICD-10-CM

## 2020-05-08 DIAGNOSIS — Z794 Long term (current) use of insulin: Secondary | ICD-10-CM | POA: Diagnosis not present

## 2020-05-08 DIAGNOSIS — L97521 Non-pressure chronic ulcer of other part of left foot limited to breakdown of skin: Secondary | ICD-10-CM | POA: Diagnosis not present

## 2020-05-08 DIAGNOSIS — E1165 Type 2 diabetes mellitus with hyperglycemia: Secondary | ICD-10-CM | POA: Diagnosis not present

## 2020-05-08 DIAGNOSIS — R8290 Unspecified abnormal findings in urine: Secondary | ICD-10-CM | POA: Diagnosis not present

## 2020-05-08 DIAGNOSIS — M5416 Radiculopathy, lumbar region: Secondary | ICD-10-CM

## 2020-05-08 DIAGNOSIS — M48061 Spinal stenosis, lumbar region without neurogenic claudication: Secondary | ICD-10-CM

## 2020-05-08 DIAGNOSIS — E785 Hyperlipidemia, unspecified: Secondary | ICD-10-CM

## 2020-05-08 DIAGNOSIS — G4733 Obstructive sleep apnea (adult) (pediatric): Secondary | ICD-10-CM | POA: Diagnosis not present

## 2020-05-08 DIAGNOSIS — E11621 Type 2 diabetes mellitus with foot ulcer: Secondary | ICD-10-CM

## 2020-05-08 DIAGNOSIS — Z9989 Dependence on other enabling machines and devices: Secondary | ICD-10-CM

## 2020-05-08 MED ORDER — ROSUVASTATIN CALCIUM 10 MG PO TABS: ORAL_TABLET | ORAL | 2 refills | Status: DC

## 2020-05-08 MED ORDER — LOSARTAN POTASSIUM 100 MG PO TABS: 100.0000 mg | ORAL_TABLET | Freq: Every day | ORAL | 1 refills | Status: DC

## 2020-05-08 NOTE — Progress Notes (Signed)
Subjective:  Patient ID: Connor Ross, male    DOB: 1966-01-05  Age: 54 y.o. MRN: [id number]  CC: The primary encounter diagnosis was Abnormal urinalysis. Diagnoses of Hyperlipidemia with target LDL less than 70, Hypertension due to endocrine disorder, Uncontrolled type 2 diabetes mellitus with hyperglycemia ([place]), and 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 type 2 diabetes mellitus, limited to breakdown of skin ([place]) were also pertinent to this visit.  HPI Connor Ross presents for follow up on type 2 DM, uncontrolled by a1c June 23.  This visit occurred during the SARS-CoV-2 public health emergency.  Safety protocols were in place, including screening questions prior to the visit, additional usage of staff PPE, and extensive cleaning of exam room while observing appropriate contact time as indicated for disinfecting solutions.   Patient has received NO  doses of the available COVID 19 vaccines.   Patient continues to mask when outside of the home except when walking in yard or at safe distances from others .  Patient denies any change in mood or development of unhealthy behaviors resuting from the pandemic's restriction of activities and socialization.     Type 2 DM:  T2DM:  he  feels generally well,  But is not  exercising regularly due to chronic back and joint pain  Checking blood sugars several times daily using the CBG monitor   . BS have been under 130 fasting and < 150 post prandially. He has been having recurrent early morning hypoglyemic events.  Taking novolog  15 minutes pre meal:  25 ,  25 and 30  .  Skips the fast acting insulin on average once a day, because he often skips breakfast meal or lunch meal.. Taking 86 units of Tresiba and Novolog qac. Following a carbohydrate modified diet 6 days per week. Denies numbness, burning and tingling of extremities. Left toe wound resolved after wound care debrided it and treated.   Appetite is good.   Has an  overdue eye exam tomorrow with [facility]    Lab Results  Component Value Date   HGBA1C 7.9 (H) 05/04/2020   Hypertension: patient has not been checking his blood pressure twice weekly at home.   Patient is following a reduce salt diet most days because his wife has CHF and is s/p heart transplant. He is d is taking medications as prescribed    Outpatient Medications Prior to Visit  Medication Sig Dispense Refill  . ASPIRIN LOW DOSE 81 MG EC tablet TAKE 1 TABLET BY MOUTH  DAILY (SWALLOW WHOLE) 90 tablet 1  . blood glucose meter kit and supplies Dispense based on patient and insurance preference. Use up to four times daily as directed. (FOR ICD-10 E10.9, E11.9). Use to check blood sugar three times daily 1 each 0  . Continuous Blood Gluc Sensor (FREESTYLE LIBRE 2 SENSOR) MISC Use to check sugar at least 4 times daily 1 each 2  . empagliflozin (JARDIANCE) 25 MG TABS tablet Take 1 tablet (25 mg total) by mouth daily. 90 tablet 3  . furosemide (LASIX) 20 MG tablet Take 1 tablet (20 mg total) by mouth every other day. 45 tablet 3  . gabapentin (NEURONTIN) 100 MG capsule Take 1 capsule (100 mg total) by mouth 3 (three) times daily. 270 capsule 3  . insulin aspart (NOVOLOG FLEXPEN) 100 UNIT/ML FlexPen Inject up to 25 units with breakfast, 25 units with lunch, and 30 units with supper 72 mL 2  . insulin degludec (TRESIBA FLEXTOUCH) 200  UNIT/ML FlexTouch Pen Inject 88 Units into the skin daily. (Patient taking differently: Inject 86 Units into the skin daily. ) 48 mL 2  . Insulin Pen Needle 32G X 6 MM MISC Use daily with insulin pen 50 each 0  . Lancets (FREESTYLE) lancets 1 each by Other route 2 (two) times daily. DX 250.02 100 each 12  . NONFORMULARY OR COMPOUNDED ITEM [facility]:  Wart Cream - Cimetidine 2%, Deoxy-D-Glucose 0.2%, Fluorouracil-5 5%, Salicylic Acid, apply to the affected area 2 times daily. 120 each 1  . ONETOUCH VERIO test strip USE TO CHECK BLOOD SUGAR 3  TIMES DAILY 300 strip  3  . Semaglutide, 1 MG/DOSE, (OZEMPIC, 1 MG/DOSE,) 2 MG/1.5ML SOPN Inject 1 mg into the skin once a week. 1.5 mL 2  . tamsulosin (FLOMAX) 0.4 MG CAPS capsule Take 1 capsule (0.4 mg total) by mouth daily. 90 capsule 3  . traMADol (ULTRAM) 50 MG tablet Take 1 tablet (50 mg total) by mouth every 6 (six) hours as needed. 90 tablet 2  . losartan (COZAAR) 50 MG tablet Take 1 tablet (50 mg total) by mouth daily. 90 tablet 3  . rosuvastatin (CRESTOR) 5 MG tablet Take 1 tablet three days per week (ie Monday, Wednesday, and Friday) (Patient taking differently: Take 5 mg by mouth every other day. TAKE 1 TABLET BY MOUTH EVERY OTHER DAY.) 36 tablet 2  . cyclobenzaprine (FLEXERIL) 10 MG tablet Take 1 tablet (10 mg total) by mouth 3 (three) times daily as needed for muscle spasms. (Patient not taking: Reported on 05/08/2020) 30 tablet 0  . Evolocumab (REPATHA) 140 MG/ML SOSY Inject 140 mg into the skin every 14 (fourteen) days. (Patient not taking: Reported on 05/08/2020) 2 mL 2  . HYDROcodone-acetaminophen (NORCO) 10-325 MG tablet Take 1 tablet by mouth every 6 (six) hours as needed for severe pain. (Patient not taking: Reported on 05/08/2020) 30 tablet 0   No facility-administered medications prior to visit.    Review of Systems;  Patient denies headache, fevers, malaise, unintentional weight loss, skin rash, eye pain, sinus congestion and sinus pain, sore throat, dysphagia,  hemoptysis , cough, dyspnea, wheezing, chest pain, palpitations, orthopnea, edema, abdominal pain, nausea, melena, diarrhea, constipation, flank pain, dysuria, hematuria, urinary  Frequency, nocturia, numbness, tingling, seizures,  Focal weakness, Loss of consciousness,  Tremor, insomnia, depression, anxiety, and suicidal ideation.      Objective:  BP (!) 146/78 (BP Location: Left Arm, Patient Position: Sitting, Cuff Size: Large)   Pulse 78   Temp 98.7 F (37.1 C)   Ht _0  (1.803 m)   Wt (!) 319 lb 3.2 oz (144.8 kg)   SpO2 96%    BMI 44.52 kg/m   BP Readings from Last 3 Encounters:  05/08/20 (!) 146/78  01/05/20 140/72  04/23/19 122/74    Wt Readings from Last 3 Encounters:  05/08/20 (!) 319 lb 3.2 oz (144.8 kg)  01/05/20 (!) 327 lb (148.3 kg)  01/03/20 (!) 313 lb (142 kg)    General appearance: alert, cooperative and appears stated age Ears: normal TM's and external ear canals both ears Throat: lips, mucosa, and tongue normal; teeth and gums normal Neck: no adenopathy, no carotid bruit, supple, symmetrical, trachea midline and thyroid not enlarged, symmetric, no tenderness/mass/nodules Back: symmetric, no curvature. ROM normal. No CVA tenderness. Lungs: clear to auscultation bilaterally Heart: regular rate and rhythm, S1, S2 normal, no murmur, click, rub or gallop Abdomen: soft, non-tender; bowel sounds normal; no masses,  no organomegaly Pulses: 2+  and symmetric Skin: Skin color, texture, turgor normal. No rashes or lesions Lymph nodes: Cervical, supraclavicular, and axillary nodes normal.  Lab Results  Component Value Date   HGBA1C 7.9 (H) 05/04/2020   HGBA1C 8.3 (H) 01/05/2020   HGBA1C 7.6 (H) 04/19/2019    Lab Results  Component Value Date   CREATININE 0.75 05/04/2020   CREATININE 0.70 01/05/2020   CREATININE 0.79 04/19/2019    Lab Results  Component Value Date   WBC 7.8 04/19/2019   HGB 14.9 04/19/2019   HCT 44.9 04/19/2019   PLT 259.0 04/19/2019   GLUCOSE 154 (H) 05/04/2020   CHOL 185 05/04/2020   TRIG 287.0 (H) 05/04/2020   HDL 26.00 (L) 05/04/2020   LDLDIRECT 114.0 05/04/2020   LDLCALC 173 (H) 04/19/2019   ALT 18 05/04/2020   AST 10 05/04/2020   NA 137 05/04/2020   K 4.2 05/04/2020   CL 101 05/04/2020   CREATININE 0.75 05/04/2020   BUN 13 05/04/2020   CO2 28 05/04/2020   TSH 2.43 04/19/2019   PSA 0.45 03/25/2018   HGBA1C 7.9 (H) 05/04/2020   MICROALBUR 2.8 (H) 01/05/2020    DG Foot Complete Left  Result Date: 04/29/2019 CLINICAL DATA:  54 year old male with a  history of nonhealing wound EXAM: LEFT FOOT - COMPLETE 3+ VIEW COMPARISON:  04/15/2018, FINDINGS: No acute displaced fracture. Degenerative changes of the tibiotalar joint, subtalar joint, enthesopathic changes of Achilles insertion and plantar fascia insertion. Degenerative changes of the midfoot. Valgus deformity of the 2-4 metatarsophalangeal joints, with no dislocation. Degenerative changes of the interphalangeal joints. No erosive changes of the distal phalanx of the great toe. There is lucency associated with the dorsal aspect of the distal great toe with no radiopaque foreign body. IMPRESSION: No acute bony abnormality, with no erosive changes of the great toe. Soft tissue changes on the dorsum of the great toe compatible with given history Degenerative changes of the foot Electronically Signed   By: Corrie Mckusick D.O.   On: 04/29/2019 08:38    Assessment & Plan:   Problem List Items Addressed This Visit      Unprioritized   DM (diabetes mellitus), type 2, uncontrolled ([place])    Improving with mealtime insulin,  Tresiba and use of CBG monitor.  Hypoglycemic events addressed by  lower evening mealtime insulin dose to 27 units .  Foot exam done.  Eye exam scheduled.  Continue ARB   Lab Results  Component Value Date   HGBA1C 7.9 (H) 05/04/2020   Lab Results  Component Value Date   MICROALBUR 2.8 (H) 01/05/2020   MICROALBUR 2.6 (H) 12/03/2018           Relevant Medications   rosuvastatin (CRESTOR) 10 MG tablet   losartan (COZAAR) 100 MG tablet   Hyperlipidemia with target LDL less than 70    He has a history of statin myalgia,  But is tolerating crestor evry other day  Lab Results  Component Value Date   CHOL 185 05/04/2020   HDL 26.00 (L) 05/04/2020   LDLCALC 173 (H) 04/19/2019   LDLDIRECT 114.0 05/04/2020   TRIG 287.0 (H) 05/04/2020   CHOLHDL 7 05/04/2020   Lab Results  Component Value Date   ALT 18 05/04/2020   AST 10 05/04/2020   ALKPHOS 72 05/04/2020   BILITOT 0.4  05/04/2020         Relevant Medications   rosuvastatin (CRESTOR) 10 MG tablet   losartan (COZAAR) 100 MG tablet   Hypertension  He is tolerating the change from  ACE Inhibitor to an ARB but is not at goal.  Dose increased to 100 mg losartan  Lab Results  Component Value Date   CREATININE 0.75 05/04/2020   Lab Results  Component Value Date   NA 137 05/04/2020   K 4.2 05/04/2020   CL 101 05/04/2020   CO2 28 05/04/2020         Relevant Medications   rosuvastatin (CRESTOR) 10 MG tablet   losartan (COZAAR) 100 MG tablet   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 type 2 diabetes mellitus, limited to breakdown of skin ([place])    Now healed post wound care referral with debridement done.       Relevant Medications   rosuvastatin (CRESTOR) 10 MG tablet   losartan (COZAAR) 100 MG tablet    Other Visit Diagnoses    Abnormal urinalysis    -  Primary   Relevant Orders   Urine Culture     A total of 40 minutes was spent with patient more than half of which was spent in counseling patient on the above mentioned issues , reviewing and explaining recent labs and imaging studies done, and coordination of care. I have discontinued [name] "Ken"'s cyclobenzaprine, HYDROcodone-acetaminophen, and Repatha. I have also changed his rosuvastatin and losartan. Additionally, I am having him maintain his freestyle, Insulin Pen Needle, NONFORMULARY OR COMPOUNDED ITEM, blood glucose meter kit and supplies, OneTouch Verio, Aspirin Low Dose, FreeStyle Libre 2 Sensor, empagliflozin, furosemide, traMADol, tamsulosin, gabapentin, Tresiba FlexTouch, NovoLOG FlexPen, and Ozempic (1 MG/DOSE).  Meds ordered this encounter  Medications  . rosuvastatin (CRESTOR) 10 MG tablet    Sig: Take 1 tablet every other day    Dispense:  45 tablet    Refill:  2  . losartan (COZAAR) 100 MG tablet    Sig: Take 1 tablet (100 mg total) by mouth daily.    Dispense:  90 tablet    Refill:  1     KEEP ON FILE FOR FUTURE REFILLS    Medications Discontinued During This Encounter  Medication Reason  . cyclobenzaprine (FLEXERIL) 10 MG tablet Patient has not taken in last 30 days  . Evolocumab (REPATHA) 140 MG/ML SOSY Not covered by the pt's insurance  . HYDROcodone-acetaminophen (NORCO) 10-325 MG tablet No longer needed (for PRN medications)  . rosuvastatin (CRESTOR) 5 MG tablet   . losartan (COZAAR) 50 MG tablet     Follow-up: Return in about 3 months (around 08/08/2020) for follow up diabetes.   [name], MD

## 2020-05-08 NOTE — Patient Instructions (Addendum)
Continue 86 units of Tresiba daily   Decrease your dinnertime insulin by 3 units  (take 27 units) to stop the morning lows.    If your morning sugar pre breakfast  Is less than 150 do NOT take insulin for breakfast (unless eating pancakes, donuts or waffles)   I 'll set up a visit with Catie to review the next phase    Increase losartan to 100 mg daily:  Start taking at night .  Tonight ONLY just take 50 mg   Starting tomorrow take 100 mg tomorrow night .  recheck BP in one week goal 130/80 or less (not below 110/70) .  We will refill at higher dose if this works

## 2020-05-09 ENCOUNTER — Telehealth: Payer: Self-pay

## 2020-05-09 LAB — URINE CULTURE
MICRO NUMBER:: 11113804
SPECIMEN QUALITY:: ADEQUATE

## 2020-05-09 NOTE — Assessment & Plan Note (Signed)
Diagnosed by sleep study over 5 years ago.   he is wearing his CPAP every night a minimum of 6 hours per night and notes improved daytime wakefulness and decreased fatigue bur needs a new machine.

## 2020-05-09 NOTE — Assessment & Plan Note (Signed)
He is tolerating the change from  ACE Inhibitor to an ARB but is not at goal.  Dose increased to 100 mg losartan  Lab Results  Component Value Date   CREATININE 0.75 05/04/2020   Lab Results  Component Value Date   NA 137 05/04/2020   K 4.2 05/04/2020   CL 101 05/04/2020   CO2 28 05/04/2020

## 2020-05-09 NOTE — Assessment & Plan Note (Signed)
He has a history of statin myalgia,  But is tolerating crestor evry other day  Lab Results  Component Value Date   CHOL 185 05/04/2020   HDL 26.00 (L) 05/04/2020   LDLCALC 173 (H) 04/19/2019   LDLDIRECT 114.0 05/04/2020   TRIG 287.0 (H) 05/04/2020   CHOLHDL 7 05/04/2020   Lab Results  Component Value Date   ALT 18 05/04/2020   AST 10 05/04/2020   ALKPHOS 72 05/04/2020   BILITOT 0.4 05/04/2020

## 2020-05-09 NOTE — Chronic Care Management (AMB) (Signed)
  Care Management   Note  05/09/2020 Name: Connor Ross MRN: [id number] DOB: Nov 20, 1965  Connor Ross is a 54 y.o. year old male who is a primary care patient of [name], MD and is actively engaged with the care management team. I reached out to [name] by phone today to assist with re-scheduling a follow up visit with the Pharmacist  Follow up plan: Unsuccessful telephone outreach attempt made. A HIPAA compliant phone message was left for the patient providing contact information and requesting a return call.  The care management team will reach out to the patient again over the next 7 days.  If patient returns call to provider office, please advise to call [facility]  at [place], [place], [place], [place] [address] Direct Dial: [phone] [email or web address] Website: [email or web address]

## 2020-05-09 NOTE — Assessment & Plan Note (Signed)
Progression of lumbar disk disease noted on prior films.  Continue tramadol and gabapentin for pain management.  Weight loss advised.  

## 2020-05-09 NOTE — Assessment & Plan Note (Signed)
Improving with mealtime insulin,  Tresiba and use of CBG monitor.  Hypoglycemic events addressed by  lower evening mealtime insulin dose to 27 units .  Foot exam done.  Eye exam scheduled.  Continue ARB   Lab Results  Component Value Date   HGBA1C 7.9 (H) 05/04/2020   Lab Results  Component Value Date   MICROALBUR 2.8 (H) 01/05/2020   MICROALBUR 2.6 (H) 12/03/2018

## 2020-05-09 NOTE — Assessment & Plan Note (Signed)
Now healed post wound care referral with debridement done.

## 2020-05-09 NOTE — Telephone Encounter (Signed)
Spoke with pt to let him know that we received his Ozempic today. Pt stated that he would be by to pick it up tomorrow.

## 2020-05-10 LAB — HM DIABETES EYE EXAM

## 2020-05-10 NOTE — Progress Notes (Signed)
There is no evidence of UTI by culture results.   Regards,   Deborra Medina, MD

## 2020-05-10 NOTE — Chronic Care Management (AMB) (Signed)
  Care Management   Note  05/10/2020 Name: Connor Ross MRN: [id number] DOB: Oct 11, 1965  Connor Ross is a 54 y.o. year old male who is a primary care patient of [name], MD and is actively engaged with the care management team. I reached out to [name] by phone today to assist with re-scheduling a follow up visit with the Pharmacist  Follow up plan: Face to Face appointment with care management team member scheduled for: 06/02/2020  Noreene Larsson, [place], [place], [place] [address] Direct Dial: [phone] [email or web address] Website: [email or web address]

## 2020-05-11 ENCOUNTER — Telehealth: Payer: Self-pay | Admitting: Internal Medicine

## 2020-05-11 ENCOUNTER — Other Ambulatory Visit: Payer: Self-pay | Admitting: Pharmacy Technician

## 2020-05-11 NOTE — Patient Outreach (Signed)
[facility]) Care Management  05/11/2020  Connor Ross 1966/02/16 [id number]  Received a call from [place] Lavell Islam who had received a message from [place] about a patient's refill request for [place] from [employer].  Care coordination call placed to [facility] who informed patient received his refills for Ozempic and Novolog but not the [place] nd [facility] embedded RPh was on PAL and she was outreaching [place] for assistance.  Informed [name] that I would [employer] and request a refill form. That form would need to be completed by provider and faxed back into [employer] as they do not currently accept refill requests from patients.  Care coordination call placed to [employer]. After holding for 35 minutes was able to speak to [name]. [name] informed she would have the form faxed over to the providers office at [phone] and have it to the attention of [name].  Sent an IM message to [name] to notify her that the form was coming. Informed her to complete the form and have the provider sign it. Then the form would have to be re faxed to [employer] at the number indicated on the form. Informed [name] to notify me if the form does not come over and another call would be place to [employer].  Will route message to [facility] embedded RPh [name] as [name].  Connor Ross, [place]  [phone]

## 2020-05-11 NOTE — Telephone Encounter (Signed)
Called and spoke with Sonia Side at [facility] because Catie is out of the office and I'm not sure what is going on with pt's [place]. We have received the Ozempic but not the [place]. Sonia Side stated that she was going to reach out to someone about it and give me a call back. Called pt to let him know what was going on and he stated that he spoke with [employer] today. They stated that they shipped the novolog yesterday and did not have anything for the [place]. Pt was advised that I would give him a call back as soon as I heard something.

## 2020-05-11 NOTE — Telephone Encounter (Signed)
Patient called about his medication being sent to the assistance program pharmacy. He has tried to speak to Catie, Catie is out of the office this week. He needs his  insulin degludec (TRESIBA FLEXTOUCH) 200 UNIT/ML FlexTouch Pen. Also suppose to have a higher dose of his Semaglutide, 1 MG/DOSE, (OZEMPIC, 1 MG/DOSE,) 2 MG/1.5ML SOPN. Patient is soon to run out. Patient wants a call from office ASAP , because he has been dealing with this for a week.

## 2020-05-11 NOTE — Telephone Encounter (Signed)
Connor Ross with [facility] called to and stated that there is a form that we need to fill out in order for the medication to be refilled because they will not take a refill request from the pt. Connor Ross stated that she is going to [employer] and have them fax over the form so we can fill it out and fax back. Called and left pt a very detailed message in regards to this.

## 2020-05-12 ENCOUNTER — Telehealth: Payer: Self-pay

## 2020-05-12 NOTE — Telephone Encounter (Signed)
Patient picked up labeled Ozempic from office.

## 2020-05-16 ENCOUNTER — Ambulatory Visit: Payer: Medicare HMO | Admitting: Pharmacist

## 2020-05-16 DIAGNOSIS — E785 Hyperlipidemia, unspecified: Secondary | ICD-10-CM

## 2020-05-16 DIAGNOSIS — E1165 Type 2 diabetes mellitus with hyperglycemia: Secondary | ICD-10-CM

## 2020-05-16 NOTE — Telephone Encounter (Signed)
Form was completed and faxed last Thursday.

## 2020-05-18 NOTE — Chronic Care Management (AMB) (Signed)
Chronic Care Management   Follow Up Note   05/18/2020 Name: MARKEVIUS TROMBETTA MRN: [id number] DOB: 16-Apr-1966  Referred by: [name], MD Reason for referral : Chronic Care Management (Medication Management)   DEMTRIUS ROUNDS is a 54 y.o. year old male who is a primary care patient of [name], [name], MD. The CCM team was consulted for assistance with chronic disease management and care coordination needs.    Care coordination completed today.  Review of patient status, including review of consultants reports, relevant laboratory and other test results, and collaboration with appropriate care team members and the patient's provider was performed as part of comprehensive patient evaluation and provision of chronic care management services.    SDOH (Social Determinants of Health) assessments performed: Yes See Care Plan activities for detailed interventions related to SDOH)  SDOH Interventions     Most Recent Value  SDOH Interventions  Financial Strain Interventions Other (Comment)  [manufacturer assistance]       Outpatient Encounter Medications as of 05/16/2020  Medication Sig  . ASPIRIN LOW DOSE 81 MG EC tablet TAKE 1 TABLET BY MOUTH  DAILY (SWALLOW WHOLE)  . blood glucose meter kit and supplies Dispense based on patient and insurance preference. Use up to four times daily as directed. (FOR ICD-10 E10.9, E11.9). Use to check blood sugar three times daily  . Continuous Blood Gluc Sensor (FREESTYLE LIBRE 2 SENSOR) MISC Use to check sugar at least 4 times daily  . empagliflozin (JARDIANCE) 25 MG TABS tablet Take 1 tablet (25 mg total) by mouth daily.  . furosemide (LASIX) 20 MG tablet Take 1 tablet (20 mg total) by mouth every other day.  . gabapentin (NEURONTIN) 100 MG capsule Take 1 capsule (100 mg total) by mouth 3 (three) times daily.  . insulin aspart (NOVOLOG FLEXPEN) 100 UNIT/ML FlexPen Inject up to 25 units with breakfast, 25 units with lunch, and 30 units with  supper  . insulin degludec (TRESIBA FLEXTOUCH) 200 UNIT/ML FlexTouch Pen Inject 88 Units into the skin daily. (Patient taking differently: Inject 86 Units into the skin daily. )  . Insulin Pen Needle 32G X 6 MM MISC Use daily with insulin pen  . Lancets (FREESTYLE) lancets 1 each by Other route 2 (two) times daily. DX 250.02  . losartan (COZAAR) 100 MG tablet Take 1 tablet (100 mg total) by mouth daily.  . NONFORMULARY OR COMPOUNDED ITEM [facility]:  Wart Cream - Cimetidine 2%, Deoxy-D-Glucose 0.2%, Fluorouracil-5 5%, Salicylic Acid, apply to the affected area 2 times daily.  [name] VERIO test strip USE TO CHECK BLOOD SUGAR 3  TIMES DAILY  . rosuvastatin (CRESTOR) 10 MG tablet Take 1 tablet every other day  . Semaglutide, 1 MG/DOSE, (OZEMPIC, 1 MG/DOSE,) 2 MG/1.5ML SOPN Inject 1 mg into the skin once a week.  . tamsulosin (FLOMAX) 0.4 MG CAPS capsule Take 1 capsule (0.4 mg total) by mouth daily.  . traMADol (ULTRAM) 50 MG tablet Take 1 tablet (50 mg total) by mouth every 6 (six) hours as needed.   No facility-administered encounter medications on file as of 05/16/2020.     Objective:   Goals Addressed              This Visit's Progress     Patient Stated   .  [name] "I am worried about costs on Medicare" (pt-stated)        CARE PLAN ENTRY (see longitudinal plan of care for additional care plan information)  Current Barriers:  .  Social, financial, and community barriers:  o Following up on refill orders from [employer] for [place]. Ozempic arrived at the office last week . Diabetes: uncontrolled, complicated by chronic medical conditions including HTN, HLD, OA, most recent A1c 7.9% . Current antihyperglycemic regimen: Tresiba U200 86 units QAM, Novolog 25 units breakfast/lunch, 27 units supper, Jardiance 25 mg daily, Ozempic 1 mg; notes that he is occasionally using 30 units w/ higher carb suppers, such as spaghetti that he had last night o APPROVED for  Ozempic, Tresiba, and Novolog through [employer] through 07/14/20 o Confirms affordability of Jardiance at this time . Current glucose readings:  o Reports fastings 80-120s recently o Occasionally post prandials >180 after carbohydrate meals . Cardiovascular risk reduction: o Current hypertensive regimen: losartan 50 mg daily, furosemide 20 mg daily o Current hyperlipidemia regimen: rosuvastatin 10 mg every other day. Baseline ASCVD risk 29%, LDL 173. Hx pravastatin intolerance. Increased from 5 mg QOD after LDL at 114 o Current antiplatelet regimen: ASA 81 mg daily  . Neuropathy: gabapentin 100 mg TID  Pharmacist Clinical Goal(s):  [name] Over the next 90 days, patient will work with [name] and primary care provider to address optimized medication management  Interventions: . [employer]. Waited on hold off and on for the past several days. Finally connected with a representative today. Novolog processed 10/26, should arrive to the office within 10-14 business days of that day. Tresiba processed 11/2, should arrive to the office within 10-14 business days of that day.  [name] Contacted patient, reviewed the above. Reviewed current supply of medications. He should receive medications above before he runs out of home supply, but I encouraged him to contact me when he has ~5 days of therapy remaining, if we need to pursue an immediate supply coupon from the company.  . Praised for improvement in A1c. Discussed maintaining CGM readings between 80-180 for at least 70% of the time. Reviewed principals of utilizing post prandial readings to evaluate carbohydrate content in meals and adjust Novolog doses.  . Will reapply for 2022 Novo Nordisk assistance at upcoming face to face CGM download appt . If patient continues to tolerate rosuvastatin 10 mg every other day, will plan to increase to daily.  Patient Self Care Activities:  . Patient will scan glucose at least QID, document, and provide at  future appointments . Patient will take medications as prescribed . Patient will report any questions or concerns to provider   Please see past updates related to this goal by clicking on the "Past Updates" button in the selected goal          Plan:  - Will meet with patient in ~ 3 weeks as previously scheduled  [name], [name], [place], [place] Pharmacist [place] [phone]

## 2020-05-18 NOTE — Patient Instructions (Signed)
Visit Information  Goals Addressed              This Visit's Progress     Patient Stated   .  [name] "I am worried about costs on Medicare" (pt-stated)        CARE PLAN ENTRY (see longitudinal plan of care for additional care plan information)  Current Barriers:  . Social, financial, and community barriers:  o Following up on refill orders from [employer] for [place]. Ozempic arrived at the office last week . Diabetes: uncontrolled, complicated by chronic medical conditions including HTN, HLD, OA, most recent A1c 7.9% . Current antihyperglycemic regimen: Tresiba U200 86 units QAM, Novolog 25 units breakfast/lunch, 27 units supper, Jardiance 25 mg daily, Ozempic 1 mg; notes that he is occasionally using 30 units w/ higher carb suppers, such as spaghetti that he had last night o APPROVED for Ozempic, Tresiba, and Novolog through [employer] through 07/14/20 o Confirms affordability of Jardiance at this time . Current glucose readings:  o Reports fastings 80-120s recently o Occasionally post prandials >180 after carbohydrate meals . Cardiovascular risk reduction: o Current hypertensive regimen: losartan 50 mg daily, furosemide 20 mg daily o Current hyperlipidemia regimen: rosuvastatin 10 mg every other day. Baseline ASCVD risk 29%, LDL 173. Hx pravastatin intolerance. Increased from 5 mg QOD after LDL at 114 o Current antiplatelet regimen: ASA 81 mg daily  . Neuropathy: gabapentin 100 mg TID  Pharmacist Clinical Goal(s):  [name] Over the next 90 days, patient will work with [name] and primary care provider to address optimized medication management  Interventions: . [employer]. Waited on hold off and on for the past several days. Finally connected with a representative today. Novolog processed 10/26, should arrive to the office within 10-14 business days of that day. Tresiba processed 11/2, should arrive to the office within 10-14 business days of that day.   [name] Contacted patient, reviewed the above. Reviewed current supply of medications. He should receive medications above before he runs out of home supply, but I encouraged him to contact me when he has ~5 days of therapy remaining, if we need to pursue an immediate supply coupon from the company.  . Praised for improvement in A1c. Discussed maintaining CGM readings between 80-180 for at least 70% of the time. Reviewed principals of utilizing post prandial readings to evaluate carbohydrate content in meals and adjust Novolog doses.  . Will reapply for 2022 Novo Nordisk assistance at upcoming face to face CGM download appt . If patient continues to tolerate rosuvastatin 10 mg every other day, will plan to increase to daily.  Patient Self Care Activities:  . Patient will scan glucose at least QID, document, and provide at future appointments . Patient will take medications as prescribed . Patient will report any questions or concerns to provider   Please see past updates related to this goal by clicking on the "Past Updates" button in the selected goal         The patient verbalized understanding of instructions provided today and declined a print copy of patient instruction materials.   Plan:  - Will meet with patient in ~ 3 weeks as previously scheduled  [name], [name], [place], [place] Pharmacist [place] [phone]

## 2020-05-19 ENCOUNTER — Telehealth: Payer: Self-pay

## 2020-05-19 NOTE — Telephone Encounter (Signed)
Spoke with pt to let him know that we have received his [place] patient assistance medication and that it is ready to be picked up. Pt stated that he would try to get by here this afternoon before 5pm.

## 2020-05-21 DIAGNOSIS — E1165 Type 2 diabetes mellitus with hyperglycemia: Secondary | ICD-10-CM | POA: Diagnosis not present

## 2020-05-22 ENCOUNTER — Telehealth: Payer: Self-pay | Admitting: Internal Medicine

## 2020-05-22 NOTE — Telephone Encounter (Addendum)
[employer] called in for prior authorization for [place] stated that they had faxed it before on [phone] faxing it again today 05-22-2020 for patient contact number [phone]

## 2020-05-22 NOTE — Telephone Encounter (Signed)
Didn't you already do the PA for this medication?

## 2020-05-22 NOTE — Progress Notes (Signed)
PA was denied, was my understanding. He doesn't need the PA right now, he's on rosuvastatin therapy and doing well.

## 2020-05-24 ENCOUNTER — Other Ambulatory Visit: Payer: Self-pay | Admitting: Internal Medicine

## 2020-05-24 ENCOUNTER — Telehealth: Payer: Self-pay

## 2020-05-24 DIAGNOSIS — I152 Hypertension secondary to endocrine disorders: Secondary | ICD-10-CM

## 2020-05-24 MED ORDER — AMLODIPINE BESYLATE 2.5 MG PO TABS: 2.5000 mg | ORAL_TABLET | Freq: Every day | ORAL | 3 refills | Status: DC

## 2020-05-24 MED ORDER — AMLODIPINE BESYLATE 2.5 MG PO TABS
2.5000 mg | ORAL_TABLET | Freq: Every day | ORAL | 3 refills | Status: DC
Start: 2020-05-24 — End: 2021-03-22

## 2020-05-24 NOTE — Assessment & Plan Note (Signed)
Amlodipine 2.5 mg added to losartan for elevated home readings/

## 2020-05-24 NOTE — Telephone Encounter (Signed)
Spoke with Connor Ross to let him know that we have received his novolog and it is ready for pick up. Connor Ross also stated that the new bp medication that Dr. [name] called in needs to be sent to [facility] Mail order. rx has been resent.

## 2020-05-26 DIAGNOSIS — H903 Sensorineural hearing loss, bilateral: Secondary | ICD-10-CM | POA: Diagnosis not present

## 2020-05-31 ENCOUNTER — Other Ambulatory Visit: Payer: Self-pay

## 2020-06-02 ENCOUNTER — Ambulatory Visit: Payer: Medicare HMO

## 2020-06-06 ENCOUNTER — Other Ambulatory Visit: Payer: Self-pay

## 2020-06-12 ENCOUNTER — Telehealth: Payer: Self-pay

## 2020-06-12 ENCOUNTER — Other Ambulatory Visit: Payer: Self-pay

## 2020-06-12 ENCOUNTER — Ambulatory Visit: Payer: Medicare HMO | Admitting: Pharmacist

## 2020-06-12 VITALS — BP 122/64

## 2020-06-12 DIAGNOSIS — I152 Hypertension secondary to endocrine disorders: Secondary | ICD-10-CM

## 2020-06-12 DIAGNOSIS — M791 Myalgia, unspecified site: Secondary | ICD-10-CM

## 2020-06-12 DIAGNOSIS — E785 Hyperlipidemia, unspecified: Secondary | ICD-10-CM

## 2020-06-12 DIAGNOSIS — E1165 Type 2 diabetes mellitus with hyperglycemia: Secondary | ICD-10-CM

## 2020-06-12 DIAGNOSIS — T466X5A Adverse effect of antihyperlipidemic and antiarteriosclerotic drugs, initial encounter: Secondary | ICD-10-CM

## 2020-06-12 DIAGNOSIS — Z6841 Body Mass Index (BMI) 40.0 and over, adult: Secondary | ICD-10-CM

## 2020-06-12 NOTE — Telephone Encounter (Signed)
Pt said he needs labs done before his follow up in January. He scheduled appt on 08/10/19 for lab and 08/12/19 with Dr. [name]. Will you please put in the orders for patient?

## 2020-06-12 NOTE — Patient Instructions (Addendum)
It was great to see you today!  Keep up the great work. Focus on keeping your fasting sugars <130 and after meal sugars <180. If you realize certain meals raise your sugar to greater than 180, either focus on cutting the carbohydrate content the next time you eat this meal, or using a few more units of Novolog.   Your blood pressure today was great! Keep the current regimen with the recent addition of amlodipine.   We would like to see your LDL be less than 70. We'll check this the next time you get labwork and likely increase rosuvastatin 10 mg to every day dosing.   Call me with any questions!  [name], [name] [phone]  Visit Information  Patient Care Plan: Medication Management    Problem Identified: Diabetes, Hypertension, Hyperlipidemia     Long-Range Goal: Disease Progression Prevention   This Visit's Progress: On track  Priority: High  Note:   Current Barriers:  . Unable to independently afford treatment regimen . Unable to achieve control of diabetes, hyperlipidemia   Pharmacist Clinical Goal(s):  [name] Over the next 90 days, patient will verbalize ability to afford treatment regimen. . Over the next 90 days, patient will  achieve control of diabetes as evidenced by improvement in A1c through collaboration with [name] and provider  Interventions: . Inter-disciplinary care team collaboration (see longitudinal plan of care) . Comprehensive medication review performed; medication list updated in electronic medical record  Diabetes: . Uncontrolled; current treatment: Ozempic 1 mg weekly, Tresiba U200 86 units daily, Novolog 10-15 units w/ breakfast, 20-30 units with other meals; Jardiance 25 mg daily.  o Hx intolerance to metformin  o Receiving [place], Tyler Aas, and Novolog through patient assistance . Current glucose readings: Date of Download: 11/16-11/29-21 % Time CGM is active: 98% Average Glucose: 150 mg/dL Glucose Management Indicator: 6.9%  Glucose  Variability: 28.8 (goal <36%) Time in Goal:  - Time in range 70-180: 80% - Time above range: 20% - Time below range: 0% Observed patterns: occasional post prandial elevations; fastings at [facility] . Denies hypoglycemic symptoms . Praised for improvement in A1c. Discussed continued focus on post prandial glucose elevations. Discussed concepts of adjusting mealtime insulin for higher carb meals, or reducing carbohydrate intake. Discussed low carb snacks.  . Continue Ozempic 1 mg weekly, Tresiba U200 86 units daily, Novolog 10-30 units with meals per anticipated carbohydrate content, Jardiance 25 mg daily.  Nash Dimmer with patient and provider on patient assistance application for [employer] for 2022. Once all parts received, will pass along to CPhT for submission and f/u  Hypertension: . Controlled; current treatment:  losartan 100 mg daily, amlodipine 2.5 mg daily, furosemide 20 mg daily . Current home readings: has not checked since addition of amlodipine . Recommended to continue current regimen.  Discussed home BP checks.   Hyperlipidemia: . Uncontrolled though likely improved; current treatment: rosuvastatin 10 mg every other day . Antiplatelet regimen: aspirin 81 mg daily . Educated on goal LDL.  Discussed that daily treatment would be preferred moving forward. Lipids will be checked w/ next PCP visit, likely plan to increase rosuvastatin to 10 mg daily at that time  Peripheral Neuropathy: . Appropriately controlled; current regimen: gabapentin 100 mg TID . Recommended to continue current regimen.  Patient Goals/Self-Care Activities . Over the next 90 days, patient will:  - take medications as prescribed check blood glucose at least TID using CGM, document, and provide at future appointments collaborate with provider on medication access solutions  Follow Up  Plan: Telephone follow up appointment with care management team member scheduled for:~ 6 weeks     Print copy of patient  instructions, educational materials, and care plan provided in person.   Plan: Telephone follow up appointment with care management team member scheduled for: ~ 6 weeks  [name], [name], [place], [place] Pharmacist [place] [phone]

## 2020-06-12 NOTE — Chronic Care Management (AMB) (Signed)
Chronic Care Management   Pharmacy Note  06/12/2020 Name: Connor Ross MRN: [id number] DOB: 02/02/1966   Subjective:  Connor Ross is a 54 y.o. year old male who is a primary care patient of [name], [name], MD. The CCM team was consulted for assistance with chronic disease management and care coordination needs.    Engaged with patient face to face for follow up visit in response to provider referral for pharmacy case management and/or care coordination services.   Consent to Services:  Connor Ross was given information about Chronic Care Management services, agreed to services, and gave verbal consent prior to initiation of services on 01/06/20. Please see initial visit note for detailed documentation.   SDOH (Social Determinants of Health) assessments and interventions performed:  SDOH Interventions     Most Recent Value  SDOH Interventions  Financial Strain Interventions Other (Comment)  [manufacturer assistance]       Objective:  Lab Results  Component Value Date   CREATININE 0.75 05/04/2020   CREATININE 0.70 01/05/2020   CREATININE 0.79 04/19/2019    Lab Results  Component Value Date   HGBA1C 7.9 (H) 05/04/2020       Component Value Date/Time   CHOL 185 05/04/2020 0922   TRIG 287.0 (H) 05/04/2020 0922   HDL 26.00 (L) 05/04/2020 0922   CHOLHDL 7 05/04/2020 0922   VLDL 57.4 (H) 05/04/2020 0922   LDLCALC 173 (H) 04/19/2019 0841   LDLDIRECT 114.0 05/04/2020 0922    BP Readings from Last 3 Encounters:  06/12/20 122/64  05/08/20 (!) 146/78  01/05/20 140/72    Assessment/Interventions: Review of patient past medical history, allergies, medications, health status, including review of consultants reports, laboratory and other test data, was performed as part of comprehensive evaluation and provision of chronic care management services.   Allergies  Allergen Reactions  . Hctz [Hydrochlorothiazide] Other (See Comments)    Extreme Cramping    . Metformin And Related Diarrhea  . Statins     Medications Reviewed Today    Reviewed by Earlyne Iba, CMA (Certified Medical Assistant) on 05/08/20 at [place] List Status: <None>  Medication Order Taking? Sig Documenting Provider Last Dose Status Informant  ASPIRIN LOW DOSE 81 MG EC tablet [id number] Yes TAKE 1 TABLET BY MOUTH  DAILY (SWALLOW WHOLE) [name], MD Taking Active   blood glucose meter kit and supplies [id number] Yes Dispense based on patient and insurance preference. Use up to four times daily as directed. (FOR ICD-10 E10.9, E11.9). Use to check blood sugar three times daily [name], MD Taking Active   Continuous Blood Gluc Sensor (FREESTYLE LIBRE 2 SENSOR) [place] [id number] Yes Use to check sugar at least 4 times daily [name], MD Taking Active        Patient not taking:      Discontinued 05/08/20 1508 (Patient has not taken in last 30 days)   empagliflozin (JARDIANCE) 25 MG TABS tablet [id number] Yes Take 1 tablet (25 mg total) by mouth daily. [name], MD Taking Active        Patient not taking:      Discontinued 05/08/20 1508 (Not covered by the pt's insurance)   furosemide (LASIX) 20 MG tablet [id number] Yes Take 1 tablet (20 mg total) by mouth every other day. [name], MD Taking Active   gabapentin (NEURONTIN) 100 MG capsule [id number] Yes Take 1 capsule (100 mg total) by mouth 3 (three) times daily. [name], MD  Taking Active        Patient not taking:      Discontinued 05/08/20 1508 (No longer needed (for PRN medications))   insulin aspart (NOVOLOG FLEXPEN) 100 UNIT/ML FlexPen [id number] Yes Inject up to 25 units with breakfast, 25 units with lunch, and 30 units with supper [name], MD Taking Active   insulin degludec (TRESIBA FLEXTOUCH) 200 UNIT/ML FlexTouch Pen [id number] Yes Inject 88 Units into the skin daily.  Patient taking differently: Inject 86 Units into the skin daily.    [name], MD Taking Active    Insulin Pen Needle 32G X 6 MM MISC [id number] Yes Use daily with insulin pen [name], MD Taking Active   Lancets (FREESTYLE) lancets [id number] Yes 1 each by Other route 2 (two) times daily. DX 250.02 [name], MD Taking Active   losartan (COZAAR) 50 MG tablet [id number] Yes Take 1 tablet (50 mg total) by mouth daily. [name], MD Taking Active   NONFORMULARY OR COMPOUNDED [name] [id number] Yes [facility]:  Wart Cream - Cimetidine 2%, Deoxy-D-Glucose 0.2%, Fluorouracil-5 5%, Salicylic Acid, apply to the affected area 2 times daily. [place], [place] T, [place] Taking Active   [facility] VERIO test strip [id number] Yes USE TO CHECK BLOOD SUGAR 3  TIMES DAILY [name], MD Taking Active   rosuvastatin (CRESTOR) 5 MG tablet [id number] Yes Take 1 tablet three days per week (ie Monday, Wednesday, and Friday)  Patient taking differently: Take 5 mg by mouth every other day. TAKE 1 TABLET BY MOUTH EVERY OTHER DAY.   [name], MD Taking Active   Semaglutide, 1 MG/DOSE, (OZEMPIC, 1 MG/DOSE,) 2 MG/1.5ML SOPN [id number] Yes Inject 1 mg into the skin once a week. [name], MD Taking Active   tamsulosin [facility]) 0.4 MG CAPS capsule [id number] Yes Take 1 capsule (0.4 mg total) by mouth daily. [name], MD Taking Active   traMADol Veatrice Bourbon) 50 MG tablet [id number] Yes Take 1 tablet (50 mg total) by mouth every 6 (six) hours as needed. [name], MD Taking Active           Patient Active Problem List   Diagnosis Date Noted  . 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 type 2 diabetes mellitus, limited to breakdown of skin ([place]) 04/23/2019  . Lower extremity edema 12/06/2018  . Esophageal stricture 12/06/2018  . Osteoarthritis 03/28/2018  . Myalgia due to statin 02/08/2015  . Colon cancer screening 07/30/2014  . Hypertension 05/16/2013  . Encounter for preventive health examination 05/16/2013  . OSA on CPAP 05/14/2013  . Spinal stenosis of lumbar region with  radiculopathy 12/13/2011  . Normal cardiac stress test   . Microalbuminuria due to type 2 diabetes mellitus ([place]) 08/18/2011  . Hyperlipidemia with target LDL less than 70 08/18/2011  . Morbid obesity with BMI of 45.0-49.9, adult ([place]) 08/18/2011  . DM (diabetes mellitus), type 2, uncontrolled ([place])     Medication Assistance: Application for Ozempic, Tresiba, and Novolog medication assistance program in process. Anticipated assistance start date 07/2020. See plan of care below for additional detail.   Patient Care Plan: Medication Management    Problem Identified: Diabetes, Hypertension, Hyperlipidemia     Long-Range Goal: Disease Progression Prevention   This Visit's Progress: On track  Priority: High  Note:   Current Barriers:  . Unable to independently afford treatment regimen . Unable to achieve control of diabetes, hyperlipidemia   Pharmacist Clinical Goal(s):  [name] Over  the next 90 days, patient will verbalize ability to afford treatment regimen. . Over the next 90 days, patient will  achieve control of diabetes as evidenced by improvement in A1c through collaboration with [name] and provider  Interventions: . Inter-disciplinary care team collaboration (see longitudinal plan of care) . Comprehensive medication review performed; medication list updated in electronic medical record  Diabetes: . Uncontrolled; current treatment: Ozempic 1 mg weekly, Tresiba U200 86 units daily, Novolog 10-15 units w/ breakfast, 20-30 units with other meals; Jardiance 25 mg daily.  o Hx intolerance to metformin  o Receiving [place], Tyler Aas, and Novolog through patient assistance . Current glucose readings: Date of Download: 11/16-11/29-21 % Time CGM is active: 98% Average Glucose: 150 mg/dL Glucose Management Indicator: 6.9%  Glucose Variability: 28.8 (goal <36%) Time in Goal:  - Time in range 70-180: 80% - Time above range: 20% - Time below range: 0% Observed patterns: occasional post  prandial elevations; fastings at [facility] . Denies hypoglycemic symptoms . Praised for improvement in A1c. Discussed continued focus on post prandial glucose elevations. Discussed concepts of adjusting mealtime insulin for higher carb meals, or reducing carbohydrate intake. Discussed low carb snacks.  . Continue Ozempic 1 mg weekly, Tresiba U200 86 units daily, Novolog 10-30 units with meals per anticipated carbohydrate content, Jardiance 25 mg daily.  Nash Dimmer with patient and provider on patient assistance application for [employer] for 2022. Once all parts received, will pass along to CPhT for submission and f/u  Hypertension: . Controlled; current treatment:  losartan 100 mg daily, amlodipine 2.5 mg daily, furosemide 20 mg daily . Current home readings: has not checked since addition of amlodipine . Recommended to continue current regimen.  Discussed home BP checks.   Hyperlipidemia: . Uncontrolled though likely improved; current treatment: rosuvastatin 10 mg every other day . Antiplatelet regimen: aspirin 81 mg daily . Educated on goal LDL.  Discussed that daily treatment would be preferred moving forward. Lipids will be checked w/ next PCP visit, likely plan to increase rosuvastatin to 10 mg daily at that time  Peripheral Neuropathy: . Appropriately controlled; current regimen: gabapentin 100 mg TID . Recommended to continue current regimen.  Patient Goals/Self-Care Activities . Over the next 90 days, patient will:  - take medications as prescribed check blood glucose at least TID using CGM, document, and provide at future appointments collaborate with provider on medication access solutions  Follow Up Plan: Telephone follow up appointment with care management team member scheduled for:~ 6 weeks      Plan: Telephone follow up appointment with care management team member scheduled for: ~ 6 weeks  [name], [name], [place], [place] Pharmacist [facility] [employer] [phone]

## 2020-06-13 ENCOUNTER — Other Ambulatory Visit: Payer: Self-pay

## 2020-06-13 NOTE — Telephone Encounter (Signed)
Labs ordered.

## 2020-06-21 ENCOUNTER — Ambulatory Visit: Payer: Medicare HMO

## 2020-06-21 DIAGNOSIS — E1165 Type 2 diabetes mellitus with hyperglycemia: Secondary | ICD-10-CM | POA: Diagnosis not present

## 2020-06-23 DIAGNOSIS — G4733 Obstructive sleep apnea (adult) (pediatric): Secondary | ICD-10-CM | POA: Diagnosis not present

## 2020-07-22 DIAGNOSIS — E1165 Type 2 diabetes mellitus with hyperglycemia: Secondary | ICD-10-CM | POA: Diagnosis not present

## 2020-07-27 ENCOUNTER — Ambulatory Visit: Payer: Medicare HMO | Admitting: Pharmacist

## 2020-07-27 DIAGNOSIS — E1165 Type 2 diabetes mellitus with hyperglycemia: Secondary | ICD-10-CM

## 2020-07-27 DIAGNOSIS — E785 Hyperlipidemia, unspecified: Secondary | ICD-10-CM

## 2020-07-27 DIAGNOSIS — I152 Hypertension secondary to endocrine disorders: Secondary | ICD-10-CM

## 2020-07-27 NOTE — Chronic Care Management (AMB) (Signed)
Chronic Care Management Pharmacy Note  07/27/2020 Name:  Connor Ross MRN:  [id number] DOB:  08/23/1965  Subjective: Connor Ross is an 55 y.o. year old male who is a primary patient of [name], [name], MD.  The CCM team was consulted for assistance with disease management and care coordination needs.    Engaged with patient by telephone for follow up on medication access in response to provider referral for pharmacy case management and/or care coordination services.   Consent to Services:  The patient was given information about Chronic Care Management services, agreed to services, and gave verbal consent prior to initiation of services.  Please see initial visit note for detailed documentation.   Objective:  Lab Results  Component Value Date   CREATININE 0.75 05/04/2020   CREATININE 0.70 01/05/2020   CREATININE 0.79 04/19/2019    Lab Results  Component Value Date   HGBA1C 7.9 (H) 05/04/2020       Component Value Date/Time   CHOL 185 05/04/2020 0922   TRIG 287.0 (H) 05/04/2020 0922   HDL 26.00 (L) 05/04/2020 0922   CHOLHDL 7 05/04/2020 0922   VLDL 57.4 (H) 05/04/2020 0922   LDLCALC 173 (H) 04/19/2019 0841   LDLDIRECT 114.0 05/04/2020 0922   Clinical ASCVD: No  The 10-year ASCVD risk score Mikey Bussing DC Jr., et al., 2013) is: 19.1%   Values used to calculate the score:     Age: 16 years     Sex: Male     Is Non-Hispanic African American: No     Diabetic: Yes     Tobacco smoker: No     Systolic Blood Pressure: 891 mmHg     Is BP treated: Yes     HDL Cholesterol: 26 mg/dL     Total Cholesterol: 185 mg/dL    BP Readings from Last 3 Encounters:  06/12/20 122/64  05/08/20 (!) 146/78  01/05/20 140/72    Assessment: Review of patient past medical history, allergies, medications, health status, including review of consultants reports, laboratory and other test data, was performed as part of comprehensive evaluation and provision of chronic care  management services.   SDOH:  (Social Determinants of Health) assessments and interventions performed:   SDOH Interventions   Flowsheet Row Most Recent Value  SDOH Interventions   Financial Strain Interventions Other (Comment)  [manufacturer assistance]      CCM Care Plan  Allergies  Allergen Reactions  . Hctz [Hydrochlorothiazide] Other (See Comments)    Extreme Cramping  . Metformin And Related Diarrhea  . Statins     Medications Reviewed Today    Reviewed by Earlyne Iba, CMA (Certified Medical Assistant) on 05/08/20 at [place] List Status: <None>  Medication Order Taking? Sig Documenting Provider Last Dose Status Informant  ASPIRIN LOW DOSE 81 MG EC tablet [id number] Yes TAKE 1 TABLET BY MOUTH  DAILY (SWALLOW WHOLE) [name], MD Taking Active   blood glucose meter kit and supplies [id number] Yes Dispense based on patient and insurance preference. Use up to four times daily as directed. (FOR ICD-10 E10.9, E11.9). Use to check blood sugar three times daily [name], MD Taking Active   Continuous Blood Gluc Sensor (FREESTYLE LIBRE 2 SENSOR) [place] [id number] Yes Use to check sugar at least 4 times daily [name], MD Taking Active        Patient not taking:      Discontinued 05/08/20 1508 (Patient has not taken in last 30 days)  empagliflozin (JARDIANCE) 25 MG TABS tablet [id number] Yes Take 1 tablet (25 mg total) by mouth daily. [name], MD Taking Active        Patient not taking:      Discontinued 05/08/20 1508 (Not covered by the pt's insurance)   furosemide (LASIX) 20 MG tablet [id number] Yes Take 1 tablet (20 mg total) by mouth every other day. [name], MD Taking Active   gabapentin (NEURONTIN) 100 MG capsule [id number] Yes Take 1 capsule (100 mg total) by mouth 3 (three) times daily. [name], MD Taking Active        Patient not taking:      Discontinued 05/08/20 1508 (No longer needed (for PRN medications))   insulin aspart  (NOVOLOG FLEXPEN) 100 UNIT/ML FlexPen [id number] Yes Inject up to 25 units with breakfast, 25 units with lunch, and 30 units with supper [name], MD Taking Active   insulin degludec (TRESIBA FLEXTOUCH) 200 UNIT/ML FlexTouch Pen [id number] Yes Inject 88 Units into the skin daily.  Patient taking differently: Inject 86 Units into the skin daily.    [name], MD Taking Active   Insulin Pen Needle 32G X 6 MM MISC [id number] Yes Use daily with insulin pen [name], MD Taking Active   Lancets (FREESTYLE) lancets [id number] Yes 1 each by Other route 2 (two) times daily. DX 250.02 [name], MD Taking Active   losartan (COZAAR) 50 MG tablet [id number] Yes Take 1 tablet (50 mg total) by mouth daily. [name], MD Taking Active   NONFORMULARY OR COMPOUNDED [name] [id number] Yes [facility]:  Wart Cream - Cimetidine 2%, Deoxy-D-Glucose 0.2%, Fluorouracil-5 5%, Salicylic Acid, apply to the affected area 2 times daily. [place], [place] T, [place] Taking Active   [facility] VERIO test strip [id number] Yes USE TO CHECK BLOOD SUGAR 3  TIMES DAILY [name], MD Taking Active   rosuvastatin (CRESTOR) 5 MG tablet [id number] Yes Take 1 tablet three days per week (ie Monday, Wednesday, and Friday)  Patient taking differently: Take 5 mg by mouth every other day. TAKE 1 TABLET BY MOUTH EVERY OTHER DAY.   [name], MD Taking Active   Semaglutide, 1 MG/DOSE, (OZEMPIC, 1 MG/DOSE,) 2 MG/1.5ML SOPN [id number] Yes Inject 1 mg into the skin once a week. [name], MD Taking Active   tamsulosin [facility]) 0.4 MG CAPS capsule [id number] Yes Take 1 capsule (0.4 mg total) by mouth daily. [name], MD Taking Active   traMADol Veatrice Bourbon) 50 MG tablet [id number] Yes Take 1 tablet (50 mg total) by mouth every 6 (six) hours as needed. [name], MD Taking Active           Patient Active Problem List   Diagnosis Date Noted  . 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 type 2 diabetes  mellitus, limited to breakdown of skin ([place]) 04/23/2019  . Lower extremity edema 12/06/2018  . Esophageal stricture 12/06/2018  . Osteoarthritis 03/28/2018  . Myalgia due to statin 02/08/2015  . Colon cancer screening 07/30/2014  . Hypertension 05/16/2013  . Encounter for preventive health examination 05/16/2013  . OSA on CPAP 05/14/2013  . Spinal stenosis of lumbar region with radiculopathy 12/13/2011  . Normal cardiac stress test   . Microalbuminuria due to type 2 diabetes mellitus ([place]) 08/18/2011  . Hyperlipidemia with target LDL less than 70 08/18/2011  . Morbid obesity with BMI of 45.0-49.9, adult ([place]) 08/18/2011  . DM (diabetes mellitus),  type 2, uncontrolled ([place])     Conditions to be addressed/monitored: HTN, HLD and DMII  Care Plan : Medication Management  Updates made by De Hollingshead, RPH-CPP since 07/27/2020 12:00 AM    Problem: Diabetes, Hypertension, Hyperlipidemia     Long-Range Goal: Disease Progression Prevention   This Visit's Progress: On track  Recent Progress: On track  Priority: High  Note:   Current Barriers:  . Unable to independently afford treatment regimen . Unable to achieve control of diabetes, hyperlipidemia   Pharmacist Clinical Goal(s):  [name] Over the next 90 days, patient will verbalize ability to afford treatment regimen. . Over the next 90 days, patient will  achieve control of diabetes as evidenced by improvement in A1c through collaboration with [name] and provider  Interventions: . 1:1 collaboration with [name], MD regarding development and update of comprehensive plan of care as evidenced by provider attestation and co-signature . Inter-disciplinary care team collaboration (see longitudinal plan of care) . Comprehensive medication review performed; medication list updated in electronic medical record  Diabetes: . Uncontrolled; current treatment: Ozempic 1 mg weekly, Tresiba U200 86 units daily, Novolog 10-15 units w/  breakfast, 20-30 units with other meals; Jardiance 25 mg daily o Hx intolerance to metformin  . Denies hypoglycemic symptoms . Received notice from CPhT that [employer] is requiring updated proof of income for patient for 5631 application. Contacted patient, reviewed this with him. He will bring social security income by the office at his earliest convenience.  . At this time, patient appears to have a ~1-2 month supply of [place], 2 month supply of Ozempic, and 1 month supply of Novolog.   Hypertension: . Controlled; current treatment:  losartan 100 mg daily, amlodipine 2.5 mg daily, furosemide 20 mg daily . Previously recommended to continue current regimen with home BP checks   Hyperlipidemia: . Uncontrolled though likely improved; current treatment: rosuvastatin 10 mg every other day . Antiplatelet regimen: aspirin 81 mg daily . Likely increase rosuvastatin to daily administration after next lipid panel. Continue to monitor.   Peripheral Neuropathy: . Appropriately controlled; current regimen: gabapentin 100 mg TID . Recommended to continue current regimen.  Patient Goals/Self-Care Activities . Over the next 90 days, patient will:  - take medications as prescribed check blood glucose at least TID using CGM, document, and provide at future appointments collaborate with provider on medication access solutions  Follow Up Plan: Telephone follow up appointment with care management team member scheduled for:~ 1 week as previously scheduled      Medication Assistance: Application for Novo Tyler Aas, [place], [place])  medication assistance program. in process.  Anticipated assistance start date TBD.  See plan of care for additional detail.  Follow Up:  Patient agrees to Care Plan and Follow-up.  Plan: Telephone follow up appointment with care management team member scheduled for:  ~ 1 week as previously scheduled  [name], [name], [place], [place] Clinical Pharmacist [employer] at [employer] [phone]

## 2020-07-27 NOTE — Patient Instructions (Signed)
Visit Information  Patient Care Plan: Medication Management    Problem Identified: Diabetes, Hypertension, Hyperlipidemia     Long-Range Goal: Disease Progression Prevention   This Visit's Progress: On track  Recent Progress: On track  Priority: High  Note:   Current Barriers:  . Unable to independently afford treatment regimen . Unable to achieve control of diabetes, hyperlipidemia   Pharmacist Clinical Goal(s):  [name] Over the next 90 days, patient will verbalize ability to afford treatment regimen. . Over the next 90 days, patient will  achieve control of diabetes as evidenced by improvement in A1c through collaboration with [name] and provider  Interventions: . 1:1 collaboration with [name], MD regarding development and update of comprehensive plan of care as evidenced by provider attestation and co-signature . Inter-disciplinary care team collaboration (see longitudinal plan of care) . Comprehensive medication review performed; medication list updated in electronic medical record  Diabetes: . Uncontrolled; current treatment: Ozempic 1 mg weekly, Tresiba U200 86 units daily, Novolog 10-15 units w/ breakfast, 20-30 units with other meals; Jardiance 25 mg daily o Hx intolerance to metformin  . Denies hypoglycemic symptoms . Received notice from CPhT that [employer] is requiring updated proof of income for patient for 9629 application. Contacted patient, reviewed this with him. He will bring social security income by the office at his earliest convenience.  . At this time, patient appears to have a ~1-2 month supply of [place], 2 month supply of Ozempic, and 1 month supply of Novolog.   Hypertension: . Controlled; current treatment:  losartan 100 mg daily, amlodipine 2.5 mg daily, furosemide 20 mg daily . Previously recommended to continue current regimen with home BP checks   Hyperlipidemia: . Uncontrolled though likely improved; current treatment: rosuvastatin 10 mg  every other day . Antiplatelet regimen: aspirin 81 mg daily . Likely increase rosuvastatin to daily administration after next lipid panel. Continue to monitor.   Peripheral Neuropathy: . Appropriately controlled; current regimen: gabapentin 100 mg TID . Recommended to continue current regimen.  Patient Goals/Self-Care Activities . Over the next 90 days, patient will:  - take medications as prescribed check blood glucose at least TID using CGM, document, and provide at future appointments collaborate with provider on medication access solutions  Follow Up Plan: Telephone follow up appointment with care management team member scheduled for:~ 1 week as previously scheduled      The patient verbalized understanding of instructions, educational materials, and care plan provided today and declined offer to receive copy of patient instructions, educational materials, and care plan.  Plan: Telephone follow up appointment with care management team member scheduled for:  ~ 1 week as previously scheduled  [name], [name], [place], [place] Clinical Pharmacist [employer] at [employer] [phone]

## 2020-07-31 ENCOUNTER — Telehealth: Payer: Medicare HMO

## 2020-08-06 DIAGNOSIS — S2002XA Contusion of left breast, initial encounter: Secondary | ICD-10-CM | POA: Diagnosis not present

## 2020-08-08 ENCOUNTER — Ambulatory Visit: Payer: Medicare HMO | Admitting: Internal Medicine

## 2020-08-09 ENCOUNTER — Other Ambulatory Visit: Payer: Medicare HMO

## 2020-08-11 ENCOUNTER — Telehealth: Payer: Self-pay | Admitting: Pharmacist

## 2020-08-11 ENCOUNTER — Ambulatory Visit: Payer: Medicare HMO | Admitting: Internal Medicine

## 2020-08-11 NOTE — Telephone Encounter (Signed)
  Chronic Care Management   Note  08/11/2020 Name: [name] MRN: [id number] DOB: October 11, 1965   CPhT followed up on status of [employer] application for patient. Unfortunately, they now are noting that the pay stubs demonstrating his wife's income are out of date (from October).   Called patient, LVM instructing that we need 2 recent paystubs to submit to [employer] as proof of his wife's income, and that he could drop this off at the office at his convenience.   [name], [name], [place], [place] Clinical Pharmacist [employer] at [place]

## 2020-08-22 ENCOUNTER — Other Ambulatory Visit (INDEPENDENT_AMBULATORY_CARE_PROVIDER_SITE_OTHER): Payer: Medicare HMO

## 2020-08-22 ENCOUNTER — Other Ambulatory Visit: Payer: Self-pay

## 2020-08-22 DIAGNOSIS — E1165 Type 2 diabetes mellitus with hyperglycemia: Secondary | ICD-10-CM

## 2020-08-22 DIAGNOSIS — E785 Hyperlipidemia, unspecified: Secondary | ICD-10-CM

## 2020-08-22 LAB — LIPID PANEL
Cholesterol: 163 mg/dL (ref 0–200)
HDL: 23.9 mg/dL — ABNORMAL LOW (ref >39.00)
NonHDL: 138.65
Total CHOL/HDL Ratio: 7
Triglycerides: 288 mg/dL — ABNORMAL HIGH (ref 0.0–149.0)
VLDL: 57.6 mg/dL — ABNORMAL HIGH (ref 0.0–40.0)

## 2020-08-22 LAB — COMPREHENSIVE METABOLIC PANEL
ALT: 20 U/L (ref 0–53)
AST: 11 U/L (ref 0–37)
Albumin: 4.1 g/dL (ref 3.5–5.2)
Alkaline Phosphatase: 65 U/L (ref 39–117)
BUN: 17 mg/dL (ref 6–23)
CO2: 27 mEq/L (ref 19–32)
Calcium: 9.1 mg/dL (ref 8.4–10.5)
Chloride: 102 mEq/L (ref 96–112)
Creatinine, Ser: 0.75 mg/dL (ref 0.40–1.50)
GFR: 100.91 mL/min (ref >60.00)
Glucose, Bld: 200 mg/dL — ABNORMAL HIGH (ref 70–99)
Potassium: 3.9 mEq/L (ref 3.5–5.1)
Sodium: 136 mEq/L (ref 135–145)
Total Bilirubin: 0.5 mg/dL (ref 0.2–1.2)
Total Protein: 7 g/dL (ref 6.0–8.3)

## 2020-08-22 LAB — HEMOGLOBIN A1C: Hgb A1c MFr Bld: 8.1 % — ABNORMAL HIGH (ref 4.6–6.5)

## 2020-08-22 LAB — LDL CHOLESTEROL, DIRECT: Direct LDL: 90 mg/dL

## 2020-08-24 ENCOUNTER — Telehealth: Payer: Self-pay | Admitting: Pharmacist

## 2020-08-24 ENCOUNTER — Ambulatory Visit: Payer: Medicare HMO | Admitting: Pharmacist

## 2020-08-24 DIAGNOSIS — E785 Hyperlipidemia, unspecified: Secondary | ICD-10-CM

## 2020-08-24 DIAGNOSIS — I152 Hypertension secondary to endocrine disorders: Secondary | ICD-10-CM

## 2020-08-24 DIAGNOSIS — E1165 Type 2 diabetes mellitus with hyperglycemia: Secondary | ICD-10-CM

## 2020-08-24 NOTE — Telephone Encounter (Signed)
Medication Samples have been labeled for the patient. They have been placed in the fridge for him to pick up at his appointment tomorrow  Drug name: Tyler Aas        Strength: U200        Qty: 2 pens  LOT: [id number]  Exp.Date: 11/2021

## 2020-08-24 NOTE — Chronic Care Management (AMB) (Addendum)
Chronic Care Management Pharmacy Note  08/24/2020 Name:  Connor Ross MRN:  [id number] DOB:  May 20, 1966  Subjective: Connor Ross is an 55 y.o. year old male who is a primary patient of [name], [name], MD.  The CCM team was consulted for assistance with disease management and care coordination needs.    Engaged with patient by telephone for  response to his call about medication access  in response to provider referral for pharmacy case management and/or care coordination services.   Consent to Services:  The patient was given information about Chronic Care Management services, agreed to services, and gave verbal consent prior to initiation of services.  Please see initial visit note for detailed documentation.   Objective:  Lab Results  Component Value Date   CREATININE 0.75 08/22/2020   CREATININE 0.75 05/04/2020   CREATININE 0.70 01/05/2020    Lab Results  Component Value Date   HGBA1C 8.1 (H) 08/22/2020       Component Value Date/Time   CHOL 163 08/22/2020 0813   TRIG 288.0 (H) 08/22/2020 0813   HDL 23.90 (L) 08/22/2020 0813   CHOLHDL 7 08/22/2020 0813   VLDL 57.6 (H) 08/22/2020 0813   LDLCALC 173 (H) 04/19/2019 0841   LDLDIRECT 90.0 08/22/2020 0813    Clinical ASCVD: No  The 10-year ASCVD risk score Mikey Bussing DC Jr., et al., 2013) is: 18%   Values used to calculate the score:     Age: 24 years     Sex: Male     Is Non-Hispanic African American: No     Diabetic: Yes     Tobacco smoker: No     Systolic Blood Pressure: 229 mmHg     Is BP treated: Yes     HDL Cholesterol: 23.9 mg/dL     Total Cholesterol: 163 mg/dL      BP Readings from Last 3 Encounters:  06/12/20 122/64  05/08/20 (!) 146/78  01/05/20 140/72    Assessment: Review of patient past medical history, allergies, medications, health status, including review of consultants reports, laboratory and other test data, was performed as part of comprehensive evaluation and provision of  chronic care management services.   SDOH:  (Social Determinants of Health) assessments and interventions performed:     CCM Care Plan  Allergies  Allergen Reactions   Hctz [Hydrochlorothiazide] Other (See Comments)    Extreme Cramping   Metformin And Related Diarrhea   Statins     Medications Reviewed Today     Reviewed by Earlyne Iba, CMA (Certified Medical Assistant) on 05/08/20 at 1509  Med List Status: <None>   Medication Order Taking? Sig Documenting Provider Last Dose Status Informant  ASPIRIN LOW DOSE 81 MG EC tablet [id number] Yes TAKE 1 TABLET BY MOUTH  DAILY (SWALLOW WHOLE) [name], MD Taking Active   blood glucose meter kit and supplies [id number] Yes Dispense based on patient and insurance preference. Use up to four times daily as directed. (FOR ICD-10 E10.9, E11.9). Use to check blood sugar three times daily [name], MD Taking Active   Continuous Blood Gluc Sensor (FREESTYLE LIBRE 2 SENSOR) [place] [id number] Yes Use to check sugar at least 4 times daily [name], MD Taking Active   Patient not taking:  Discontinued 05/08/20 1508 (Patient has not taken in last 30 days)   empagliflozin (JARDIANCE) 25 MG TABS tablet [id number] Yes Take 1 tablet (25 mg total) by mouth daily. [name], MD Taking Active  Patient not taking:  Discontinued 05/08/20 1508 (Not covered by the pt's insurance)   furosemide (LASIX) 20 MG tablet [id number] Yes Take 1 tablet (20 mg total) by mouth every other day. [name], MD Taking Active   gabapentin (NEURONTIN) 100 MG capsule [id number] Yes Take 1 capsule (100 mg total) by mouth 3 (three) times daily. [name], MD Taking Active   Patient not taking:  Discontinued 05/08/20 1508 (No longer needed (for PRN medications))   insulin aspart (NOVOLOG FLEXPEN) 100 UNIT/ML FlexPen [id number] Yes Inject up to 25 units with breakfast, 25 units with lunch, and 30 units with supper [name], MD Taking Active   insulin  degludec (TRESIBA FLEXTOUCH) 200 UNIT/ML FlexTouch Pen [id number] Yes Inject 88 Units into the skin daily.  Patient taking differently: Inject 86 Units into the skin daily.    [name], MD Taking Active   Insulin Pen Needle 32G X 6 MM MISC [id number] Yes Use daily with insulin pen [name], MD Taking Active   Lancets (FREESTYLE) lancets [id number] Yes 1 each by Other route 2 (two) times daily. DX 250.02 [name], MD Taking Active   losartan (COZAAR) 50 MG tablet [id number] Yes Take 1 tablet (50 mg total) by mouth daily. [name], MD Taking Active   NONFORMULARY OR COMPOUNDED [name] [id number] Yes [facility]:  Wart Cream - Cimetidine 2%, Deoxy-D-Glucose 0.2%, Fluorouracil-5 5%, Salicylic Acid, apply to the affected area 2 times daily. [place], [place] T, [place] Taking Active   [facility] VERIO test strip [id number] Yes USE TO CHECK BLOOD SUGAR 3  TIMES DAILY [name], MD Taking Active   rosuvastatin (CRESTOR) 5 MG tablet [id number] Yes Take 1 tablet three days per week (ie Monday, Wednesday, and Friday)  Patient taking differently: Take 5 mg by mouth every other day. TAKE 1 TABLET BY MOUTH EVERY OTHER DAY.   [name], MD Taking Active   Semaglutide, 1 MG/DOSE, (OZEMPIC, 1 MG/DOSE,) 2 MG/1.5ML SOPN [id number] Yes Inject 1 mg into the skin once a week. [name], MD Taking Active   tamsulosin [facility]) 0.4 MG CAPS capsule [id number] Yes Take 1 capsule (0.4 mg total) by mouth daily. [name], MD Taking Active   traMADol Veatrice [place]) 50 MG tablet [id number] Yes Take 1 tablet (50 mg total) by mouth every 6 (six) hours as needed. [name], MD Taking Active             Patient Active Problem List   Diagnosis Date Noted   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 type 2 diabetes mellitus, limited to breakdown of skin ([place]) 04/23/2019   Lower extremity edema 12/06/2018   Esophageal stricture 12/06/2018   Osteoarthritis 03/28/2018   Myalgia due to  statin 02/08/2015   Colon cancer screening 07/30/2014   Hypertension 05/16/2013   Encounter for preventive health examination 05/16/2013   OSA on CPAP 05/14/2013   Spinal stenosis of lumbar region with radiculopathy 12/13/2011   Normal cardiac stress test    Microalbuminuria due to type 2 diabetes mellitus ([place]) 08/18/2011   Hyperlipidemia with target LDL less than 70 08/18/2011   Morbid obesity with BMI of 45.0-49.9, adult ([place]) 08/18/2011   DM (diabetes mellitus), type 2, uncontrolled ([place])     Conditions to be addressed/monitored: HTN, HLD and DMII  Care Plan : Medication Management  Updates made by De Hollingshead, RPH-CPP since 08/24/2020 12:00 AM     Problem: Diabetes, Hypertension,  Hyperlipidemia      Long-Range Goal: Disease Progression Prevention   Recent Progress: On track  Priority: High  Note:   Current Barriers:  Unable to independently afford treatment regimen Unable to achieve control of diabetes, hyperlipidemia   Pharmacist Clinical Goal(s):  Over the next 90 days, patient will verbalize ability to afford treatment regimen. Over the next 90 days, patient will  achieve control of diabetes as evidenced by improvement in A1c through collaboration with [name] and provider  Interventions: 1:1 collaboration with [name], MD regarding development and update of comprehensive plan of care as evidenced by provider attestation and co-signature Inter-disciplinary care team collaboration (see longitudinal plan of care) Comprehensive medication review performed; medication list updated in electronic medical record  Diabetes: Uncontrolled; current treatment: Ozempic 1 mg weekly, Tresiba U200 88 units daily, Novolog 10-15 units w/ breakfast, 20-30 units with other meals; Jardiance 25 mg daily Hx intolerance to metformin  Approved for patient assistance for 2022. Notified patient. He notes that he will need more of a supply of [place]. Providing with 2 pens today.    Hypertension: Controlled; current treatment:  losartan 100 mg daily, amlodipine 2.5 mg daily, furosemide 20 mg daily Previously recommended to continue current regimen with home BP checks   Hyperlipidemia: Uncontrolled though likely improved; current treatment: rosuvastatin 10 mg every other day Recommend to increase rosuvastatin to daily administration.  Antiplatelet regimen: aspirin 81 mg daily   Peripheral Neuropathy: Appropriately controlled; current regimen: gabapentin 100 mg TID Recommended to continue current regimen.  Patient Goals/Self-Care Activities Over the next 90 days, patient will:  - take medications as prescribed check blood glucose at least TID using CGM, document, and provide at future appointments collaborate with provider on medication access solutions  Follow Up Plan: Telephone follow up appointment with care management team member scheduled for: ~ 4 weeks     Medication Assistance:  Joni Reining, Novolog obtained through [employer] medication assistance program.  Enrollment ends 06/13/21  Follow Up:  Patient agrees to Care Plan and Follow-up.  Plan: Telephone follow up appointment with care management team member scheduled for:  ~ 4 weeks  [name], [name], [place], CPP Clinical Pharmacist [place] at [facility] [phone]    I have reviewed the above information and agree with above.   Deborra Medina, MD

## 2020-08-24 NOTE — Patient Instructions (Signed)
Visit Information  PATIENT GOALS: Goals Addressed              This Visit's Progress     Patient Stated   .  Medication Management (pt-stated)        Patient Goals/Self-Care Activities . Over the next 90 days, patient will:  - take medications as prescribed check blood glucose at least TID using CGM, document, and provide at future appointments collaborate with provider on medication access solutions       The patient verbalized understanding of instructions, educational materials, and care plan provided today and declined offer to receive copy of patient instructions, educational materials, and care plan.  Plan: Telephone follow up appointment with care management team member scheduled for:  ~ 4 weeks  [name], [name], [place], [place] Clinical Pharmacist [employer] at [employer] [phone]

## 2020-08-25 ENCOUNTER — Encounter: Payer: Self-pay | Admitting: Internal Medicine

## 2020-08-25 ENCOUNTER — Other Ambulatory Visit: Payer: Self-pay

## 2020-08-25 ENCOUNTER — Ambulatory Visit (INDEPENDENT_AMBULATORY_CARE_PROVIDER_SITE_OTHER): Payer: Medicare HMO | Admitting: Internal Medicine

## 2020-08-25 VITALS — BP 136/76 | HR 91 | Temp 98.5°F | Ht 70.98 in | Wt 314.2 lb

## 2020-08-25 DIAGNOSIS — I152 Hypertension secondary to endocrine disorders: Secondary | ICD-10-CM | POA: Diagnosis not present

## 2020-08-25 DIAGNOSIS — Z8616 Personal history of COVID-19: Secondary | ICD-10-CM | POA: Diagnosis not present

## 2020-08-25 DIAGNOSIS — M25512 Pain in left shoulder: Secondary | ICD-10-CM

## 2020-08-25 DIAGNOSIS — E785 Hyperlipidemia, unspecified: Secondary | ICD-10-CM

## 2020-08-25 DIAGNOSIS — E1165 Type 2 diabetes mellitus with hyperglycemia: Secondary | ICD-10-CM | POA: Diagnosis not present

## 2020-08-25 DIAGNOSIS — M5416 Radiculopathy, lumbar region: Secondary | ICD-10-CM | POA: Diagnosis not present

## 2020-08-25 DIAGNOSIS — S2232XD Fracture of one rib, left side, subsequent encounter for fracture with routine healing: Secondary | ICD-10-CM | POA: Diagnosis not present

## 2020-08-25 DIAGNOSIS — R600 Localized edema: Secondary | ICD-10-CM | POA: Diagnosis not present

## 2020-08-25 DIAGNOSIS — Z794 Long term (current) use of insulin: Secondary | ICD-10-CM | POA: Diagnosis not present

## 2020-08-25 DIAGNOSIS — M48061 Spinal stenosis, lumbar region without neurogenic claudication: Secondary | ICD-10-CM

## 2020-08-25 NOTE — Progress Notes (Signed)
Subjective:  Patient ID: Connor Ross, male    DOB: 1966-01-10  Age: 55 y.o. MRN: [id number]  CC: The primary encounter diagnosis was Uncontrolled type 2 diabetes mellitus with hyperglycemia ([place]). Diagnoses of Personal history of COVID-19, Hypertension due to endocrine disorder, Lower extremity edema, Spinal stenosis of lumbar region with radiculopathy, Hyperlipidemia with target LDL less than 70, Pain of left shoulder joint on movement, and Fracture of one rib, left side, subsequent encounter for fracture with routine healing were also pertinent to this visit.  HPI DONTRE LADUCA presents for follow up on chronic and acute conditions   This visit occurred during the SARS-CoV-2 public health emergency.  Safety protocols were in place, including screening questions prior to the visit, additional usage of staff PPE, and extensive cleaning of exam room while observing appropriate contact time as indicated for disinfecting solutions.   1) T2DM:  He feels generally well except for rib pain and other chronic pain complaints.   His regimen includes [place] and Novolog meal time insulin,  ozempic and jardiance.  His readings have been high due to patient's rationing of tresiba, due to a delay in refill from mail order .  He has reduced his dose to 40 units daily for the past 2 weeks but has not adjusted his mealtime insulin despite having the convenience of a  CGM .  He did not bring his phone with him today so his readings could not  Be reviewed.    2) Chronic low back pain :  He is using tramadol and gabapentin  3) Rib pain:  He had  recent fall resulting in rib contusions and one lateral fracture on the left  (he slipped on an icy patch of grass after stepping out of his truck and hit the ground).  He was treated at [facility] on jan 23 .  Copies of x rays reviewed b y me today.  The ribs are less apinful now,  But the Shoulder on left side is hurting more.  He has pain with  raising the arm above the horizontal. The pain localized to the Subacromial area, he has been  Using lidocaine patches  ,  Ice patches  Outpatient Medications Prior to Visit  Medication Sig Dispense Refill  . amLODipine (NORVASC) 2.5 MG tablet Take 1 tablet (2.5 mg total) by mouth daily. 90 tablet 3  . ASPIRIN LOW DOSE 81 MG EC tablet TAKE 1 TABLET BY MOUTH  DAILY (SWALLOW WHOLE) 90 tablet 1  . blood glucose meter kit and supplies Dispense based on patient and insurance preference. Use up to four times daily as directed. (FOR ICD-10 E10.9, E11.9). Use to check blood sugar three times daily 1 each 0  . Continuous Blood Gluc Sensor (FREESTYLE LIBRE 2 SENSOR) MISC Use to check sugar at least 4 times daily 1 each 2  . empagliflozin (JARDIANCE) 25 MG TABS tablet Take 1 tablet (25 mg total) by mouth daily. 90 tablet 3  . furosemide (LASIX) 20 MG tablet Take 1 tablet (20 mg total) by mouth every other day. 45 tablet 3  . gabapentin (NEURONTIN) 100 MG capsule Take 1 capsule (100 mg total) by mouth 3 (three) times daily. 270 capsule 3  . insulin aspart (NOVOLOG FLEXPEN) 100 UNIT/ML FlexPen Inject up to 25 units with breakfast, 25 units with lunch, and 30 units with supper 72 mL 2  . insulin degludec (TRESIBA FLEXTOUCH) 200 UNIT/ML FlexTouch Pen Inject 88 Units into the skin daily. (Patient taking  differently: Inject 86 Units into the skin daily. ) 48 mL 2  . Insulin Pen Needle 32G X 6 MM MISC Use daily with insulin pen 50 each 0  . Lancets (FREESTYLE) lancets 1 each by Other route 2 (two) times daily. DX 250.02 100 each 12  . losartan (COZAAR) 100 MG tablet Take 1 tablet (100 mg total) by mouth daily. 90 tablet 1  . NONFORMULARY OR COMPOUNDED ITEM [facility]:  Wart Cream - Cimetidine 2%, Deoxy-D-Glucose 0.2%, Fluorouracil-5 5%, Salicylic Acid, apply to the affected area 2 times daily. 120 each 1  . ONETOUCH VERIO test strip USE TO CHECK BLOOD SUGAR 3  TIMES DAILY 300 strip 3  . rosuvastatin (CRESTOR)  10 MG tablet Take 1 tablet every other day 45 tablet 2  . Semaglutide, 1 MG/DOSE, (OZEMPIC, 1 MG/DOSE,) 2 MG/1.5ML SOPN Inject 1 mg into the skin once a week. 1.5 mL 2  . tamsulosin (FLOMAX) 0.4 MG CAPS capsule Take 1 capsule (0.4 mg total) by mouth daily. 90 capsule 3  . traMADol (ULTRAM) 50 MG tablet Take 1 tablet (50 mg total) by mouth every 6 (six) hours as needed. 90 tablet 2   No facility-administered medications prior to visit.    Review of Systems;  Patient denies headache, fevers, malaise, unintentional weight loss, skin rash, eye pain, sinus congestion and sinus pain, sore throat, dysphagia,  hemoptysis , cough, dyspnea, wheezing, chest pain, palpitations, orthopnea, edema, abdominal pain, nausea, melena, diarrhea, constipation, flank pain, dysuria, hematuria, urinary  Frequency, nocturia, numbness, tingling, seizures,  Focal weakness, Loss of consciousness,  Tremor, insomnia, depression, anxiety, and suicidal ideation.      Objective:  BP 136/76 (BP Location: Left Arm, Patient Position: Sitting)   Pulse 91   Temp 98.5 F (36.9 C)   Ht 5' 10.98" (1.803 m)   Wt (!) 314 lb 3.2 oz (142.5 kg)   SpO2 99%   BMI 43.84 kg/m   BP Readings from Last 3 Encounters:  08/25/20 136/76  06/12/20 122/64  05/08/20 (!) 146/78    Wt Readings from Last 3 Encounters:  08/25/20 (!) 314 lb 3.2 oz (142.5 kg)  05/08/20 (!) 319 lb 3.2 oz (144.8 kg)  01/05/20 (!) 327 lb (148.3 kg)    General appearance: alert, cooperative and appears stated age Ears: normal TM's and external ear canals both ears Throat: lips, mucosa, and tongue normal; teeth and gums normal Neck: no adenopathy, no carotid bruit, supple, symmetrical, trachea midline and thyroid not enlarged, symmetric, no tenderness/mass/nodules Back: symmetric, no curvature. ROM normal. No CVA tenderness. Chest : left rib nontender to palpation. Lungs: clear to auscultation bilaterally Heart: regular rate and rhythm, S1, S2 normal, no  murmur, click, rub or gallop Abdomen: soft, non-tender; bowel sounds normal; no masses,  no organomegaly Pulses: 2+ and symmetric Skin: Skin color, texture, turgor normal. No rashes or lesions MSK:  Left shoulder pain at subacromial bursa with abduction above 180 degrees,  Lymph nodes: Cervical, supraclavicular, and axillary nodes normal.  Lab Results  Component Value Date   HGBA1C 8.1 (H) 08/22/2020   HGBA1C 7.9 (H) 05/04/2020   HGBA1C 8.3 (H) 01/05/2020    Lab Results  Component Value Date   CREATININE 0.75 08/22/2020   CREATININE 0.75 05/04/2020   CREATININE 0.70 01/05/2020    Lab Results  Component Value Date   WBC 7.8 04/19/2019   HGB 14.9 04/19/2019   HCT 44.9 04/19/2019   PLT 259.0 04/19/2019   GLUCOSE 200 (H) 08/22/2020   CHOL  163 08/22/2020   TRIG 288.0 (H) 08/22/2020   HDL 23.90 (L) 08/22/2020   LDLDIRECT 90.0 08/22/2020   LDLCALC 173 (H) 04/19/2019   ALT 20 08/22/2020   AST 11 08/22/2020   NA 136 08/22/2020   K 3.9 08/22/2020   CL 102 08/22/2020   CREATININE 0.75 08/22/2020   BUN 17 08/22/2020   CO2 27 08/22/2020   TSH 2.43 04/19/2019   PSA 0.45 03/25/2018   HGBA1C 8.1 (H) 08/22/2020   MICROALBUR 2.8 (H) 01/05/2020    DG Foot Complete Left  Result Date: 04/29/2019 CLINICAL DATA:  55 year old male with a history of nonhealing wound EXAM: LEFT FOOT - COMPLETE 3+ VIEW COMPARISON:  04/15/2018, FINDINGS: No acute displaced fracture. Degenerative changes of the tibiotalar joint, subtalar joint, enthesopathic changes of Achilles insertion and plantar fascia insertion. Degenerative changes of the midfoot. Valgus deformity of the 2-4 metatarsophalangeal joints, with no dislocation. Degenerative changes of the interphalangeal joints. No erosive changes of the distal phalanx of the great toe. There is lucency associated with the dorsal aspect of the distal great toe with no radiopaque foreign body. IMPRESSION: No acute bony abnormality, with no erosive changes of the  great toe. Soft tissue changes on the dorsum of the great toe compatible with given history Degenerative changes of the foot Electronically Signed   By: Corrie Mckusick D.O.   On: 04/29/2019 08:38    Assessment & Plan:   Problem List Items Addressed This Visit      Unprioritized   DM (diabetes mellitus), type 2, uncontrolled ([place]) - Primary    Not at goal. BS have been high due to reduced [place] dose.  He has been given several sample pens and advised to resume his prior dose of [place] and start checking pre and post prandials for adjustment of novolog insulin doses         Relevant Orders   Hemoglobin A1c   Lipid panel   Comprehensive metabolic panel   Fracture of one rib, left side, subsequent encounter for fracture with routine healing    His pain is improving.  prior films reviewed,  Nondisplaced .  No further workup.        Hyperlipidemia with target LDL less than 70    He has a history of statin myalgia,  But is tolerating crestor every other day  Lab Results  Component Value Date   CHOL 163 08/22/2020   HDL 23.90 (L) 08/22/2020   LDLCALC 173 (H) 04/19/2019   LDLDIRECT 90.0 08/22/2020   TRIG 288.0 (H) 08/22/2020   CHOLHDL 7 08/22/2020   Lab Results  Component Value Date   ALT 20 08/22/2020   AST 11 08/22/2020   ALKPHOS 65 08/22/2020   BILITOT 0.5 08/22/2020         Hypertension    Improved control with Amlodipine 2.5 mg added to losartan . Home readings < 140/80.  No changes today  Lab Results  Component Value Date   CREATININE 0.75 08/22/2020   Lab Results  Component Value Date   NA 136 08/22/2020   K 3.9 08/22/2020   CL 102 08/22/2020   CO2 27 08/22/2020         Lower extremity edema    Managed with use of furosemide every other day       Pain of left shoulder joint on movement    Secondary to blunt trauma during recent fall. His pain is aggravated by abduction of shoulder above shoulder height.  He was given  exercises to prevent frozen  shoulder.      Personal history of COVID-19   Relevant Orders   SARS-CoV-2 Semi-Quantitative Total Antibody, Spike   Spinal stenosis of lumbar region with radiculopathy    Progression of lumbar disk disease noted on prior films.  Continue tramadol and gabapentin for pain management.  Weight loss advised.          I am having [name] "Ken" maintain his freestyle, Insulin Pen Needle, NONFORMULARY OR COMPOUNDED ITEM, blood glucose meter kit and supplies, OneTouch Verio, Aspirin Low Dose, FreeStyle Libre 2 Sensor, empagliflozin, furosemide, traMADol, tamsulosin, gabapentin, Tresiba FlexTouch, NovoLOG FlexPen, Ozempic (1 MG/DOSE), rosuvastatin, losartan, and amLODipine.  No orders of the defined types were placed in this encounter.   There are no discontinued medications.  Follow-up: Return in about 3 months (around 11/21/2020).   [name], MD

## 2020-08-25 NOTE — Telephone Encounter (Signed)
Medication samples were given to [facility] during his appointment. He voiced concern that there was only 2 pens as he states he receives around 5. I spoke to Catie and she states that she was only able to get him 2 pens.

## 2020-08-25 NOTE — Patient Instructions (Addendum)
Check pre and post meal blood sugars to fine tune your novolog doses   remember our goal is for your 2 hr post prnadial readings to be 160 or less   Don't let your shoulder get frozen.  Do the exercises daily to increase mobility:  Walk your fingers up the wall   Pendulum swings.  Make circles with arm hanging down toward floor   Return for repeat labs on or after May 10

## 2020-08-27 ENCOUNTER — Encounter: Payer: Self-pay | Admitting: Internal Medicine

## 2020-08-27 DIAGNOSIS — S2232XD Fracture of one rib, left side, subsequent encounter for fracture with routine healing: Secondary | ICD-10-CM | POA: Insufficient documentation

## 2020-08-27 DIAGNOSIS — M25512 Pain in left shoulder: Secondary | ICD-10-CM | POA: Insufficient documentation

## 2020-08-27 HISTORY — DX: Fracture of one rib, left side, subsequent encounter for fracture with routine healing: S22.32XD

## 2020-08-27 NOTE — Assessment & Plan Note (Signed)
Improved control with Amlodipine 2.5 mg added to losartan . Home readings < 140/80.  No changes today  Lab Results  Component Value Date   CREATININE 0.75 08/22/2020   Lab Results  Component Value Date   NA 136 08/22/2020   K 3.9 08/22/2020   CL 102 08/22/2020   CO2 27 08/22/2020

## 2020-08-27 NOTE — Assessment & Plan Note (Signed)
His pain is improving.  prior films reviewed,  Nondisplaced .  No further workup.

## 2020-08-27 NOTE — Assessment & Plan Note (Signed)
Managed with use of furosemide every other day

## 2020-08-27 NOTE — Assessment & Plan Note (Signed)
Secondary to blunt trauma during recent fall. His pain is aggravated by abduction of shoulder above shoulder height.  He was given exercises to prevent frozen shoulder.

## 2020-08-27 NOTE — Assessment & Plan Note (Signed)
He has a history of statin myalgia,  But is tolerating crestor every other day  Lab Results  Component Value Date   CHOL 163 08/22/2020   HDL 23.90 (L) 08/22/2020   LDLCALC 173 (H) 04/19/2019   LDLDIRECT 90.0 08/22/2020   TRIG 288.0 (H) 08/22/2020   CHOLHDL 7 08/22/2020   Lab Results  Component Value Date   ALT 20 08/22/2020   AST 11 08/22/2020   ALKPHOS 65 08/22/2020   BILITOT 0.5 08/22/2020

## 2020-08-27 NOTE — Assessment & Plan Note (Signed)
Not at goal. BS have been high due to reduced [place] dose.  He has been given several sample pens and advised to resume his prior dose of [place] and start checking pre and post prandials for adjustment of novolog insulin doses

## 2020-08-27 NOTE — Assessment & Plan Note (Signed)
Progression of lumbar disk disease noted on prior films.  Continue tramadol and gabapentin for pain management.  Weight loss advised.

## 2020-09-07 ENCOUNTER — Telehealth: Payer: Self-pay

## 2020-09-07 NOTE — Telephone Encounter (Signed)
We received patient's medications through patient assistance [employer].Pt called & informed ready for pick-up at our office.   Ozempic 1X3ML prefilled pens 4 boxes NDC: [id number] LOT: [id number] EXP: 06/13/22  Tyler Aas 3X3 ML prefilled pens 6 boxes NDC: [id number] LOT: [id number] EXP: 12/13/22  Novofine 32G pen needles 2 boxes NDC: [id number] LOT: [id number] EXP: 03/14/24

## 2020-09-11 ENCOUNTER — Telehealth: Payer: Self-pay

## 2020-09-11 NOTE — Telephone Encounter (Signed)
Spoke with pt and let him know that we have received his Novolog patient assistance medication and it is ready to be picked up. Pt gave a verbal understanding.

## 2020-09-22 DIAGNOSIS — E1165 Type 2 diabetes mellitus with hyperglycemia: Secondary | ICD-10-CM | POA: Diagnosis not present

## 2020-09-28 ENCOUNTER — Other Ambulatory Visit: Payer: Self-pay

## 2020-10-01 NOTE — Progress Notes (Deleted)
°

## 2020-10-02 ENCOUNTER — Ambulatory Visit (INDEPENDENT_AMBULATORY_CARE_PROVIDER_SITE_OTHER): Payer: Medicare HMO | Admitting: Pharmacist

## 2020-10-02 ENCOUNTER — Other Ambulatory Visit: Payer: Self-pay

## 2020-10-02 DIAGNOSIS — E785 Hyperlipidemia, unspecified: Secondary | ICD-10-CM

## 2020-10-02 DIAGNOSIS — I152 Hypertension secondary to endocrine disorders: Secondary | ICD-10-CM | POA: Diagnosis not present

## 2020-10-02 DIAGNOSIS — E1165 Type 2 diabetes mellitus with hyperglycemia: Secondary | ICD-10-CM

## 2020-10-02 MED ORDER — LOSARTAN POTASSIUM 100 MG PO TABS: 100.0000 mg | ORAL_TABLET | Freq: Every day | ORAL | 1 refills | Status: DC

## 2020-10-02 NOTE — Chronic Care Management (AMB) (Addendum)
Chronic Care Management Pharmacy Note  10/02/2020 Name:  Connor Ross MRN:  [id number] DOB:  11-15-1965  Subjective: Connor Ross is an 55 y.o. year old male who is a primary patient of [name], [name], MD.  The CCM team was consulted for assistance with disease management and care coordination needs.    Engaged with patient face to face for follow up visit in response to provider referral for pharmacy case management and/or care coordination services.   Consent to Services:  The patient was given information about Chronic Care Management services, agreed to services, and gave verbal consent prior to initiation of services.  Please see initial visit note for detailed documentation.   Patient Care Team: [name], MD as PCP - General (Internal Medicine) De Hollingshead, RPH-CPP as Pharmacist (Pharmacist)  Recent office visits: 08/25/20 w/PCP Dr. [name]: pt ran out of [place] and only take 40 units daily instead of 86 units dail as prescribed. Pt provided with Tresiba samples.   Recent consult visits: None since last visit  Hospital visits: None in previous 6 months  Objective:  Lab Results  Component Value Date   CREATININE 0.75 08/22/2020   CREATININE 0.75 05/04/2020   CREATININE 0.70 01/05/2020    Lab Results  Component Value Date   HGBA1C 8.1 (H) 08/22/2020   Last diabetic Eye exam:  Lab Results  Component Value Date/Time   HMDIABEYEEXA No Retinopathy 05/10/2020 12:00 AM    Last diabetic Foot exam:  Lab Results  Component Value Date/Time   HMDIABFOOTEX normal 02/08/2015 12:00 AM        Component Value Date/Time   CHOL 163 08/22/2020 0813   TRIG 288.0 (H) 08/22/2020 0813   HDL 23.90 (L) 08/22/2020 0813   CHOLHDL 7 08/22/2020 0813   VLDL 57.6 (H) 08/22/2020 0813   LDLCALC 173 (H) 04/19/2019 0841   LDLDIRECT 90.0 08/22/2020 0813    Hepatic Function Latest Ref Rng & Units 08/22/2020 05/04/2020 01/05/2020  Total Protein 6.0 - 8.3  g/dL 7.0 6.6 7.5  Albumin 3.5 - 5.2 g/dL 4.1 4.3 4.7  AST 0 - 37 U/L _0 ALT 0 - 53 U/L _1 Alk Phosphatase 39 - 117 U/L 65 72 68  Total Bilirubin 0.2 - 1.2 mg/dL 0.5 0.4 0.4    Lab Results  Component Value Date/Time   TSH 2.43 04/19/2019 08:41 AM   TSH 3.28 05/10/2015 10:44 AM    CBC Latest Ref Rng & Units 04/19/2019 02/06/2015 04/03/2012  WBC 4.0 - 10.5 K/uL 7.8 7.2 8.7  Hemoglobin 13.0 - 17.0 g/dL 14.9 14.6 15.6  Hematocrit 39.0 - 52.0 % 44.9 43.6 45.5  Platelets 150.0 - 400.0 K/uL 259.0 241.0 228    Clinical ASCVD: No  The 10-year ASCVD risk score Mikey Bussing DC Jr., et al., 2013) is: 21.4%   Values used to calculate the score:     Age: 66 years     Sex: Male     Is Non-Hispanic African American: No     Diabetic: Yes     Tobacco smoker: No     Systolic Blood Pressure: 259 mmHg     Is BP treated: Yes     HDL Cholesterol: 23.9 mg/dL     Total Cholesterol: 163 mg/dL      Social History   Tobacco Use  Smoking Status Never Smoker  Smokeless Tobacco Never Used   BP Readings from Last 3 Encounters:  08/25/20 136/76  06/12/20 122/64  05/08/20 (!) 146/78   Pulse Readings from Last 3 Encounters:  08/25/20 91  05/08/20 78  01/05/20 89   Wt Readings from Last 3 Encounters:  08/25/20 (!) 314 lb 3.2 oz (142.5 kg)  05/08/20 (!) 319 lb 3.2 oz (144.8 kg)  01/05/20 (!) 327 lb (148.3 kg)    Assessment: Review of patient past medical history, allergies, medications, health status, including review of consultants reports, laboratory and other test data, was performed as part of comprehensive evaluation and provision of chronic care management services.   SDOH:  (Social Determinants of Health) assessments and interventions performed:  SDOH Interventions    Flowsheet Row Most Recent Value  SDOH Interventions   Financial Strain Interventions Other (Comment)  [manufacturer assistance]       CCM Care Plan  Allergies  Allergen Reactions   Hctz  [Hydrochlorothiazide] Other (See Comments)    Extreme Cramping   Metformin And Related Diarrhea   Statins     Medications Reviewed Today     Reviewed by Avie Arenas, RPH (Pharmacist) on 10/02/20 at 1355  Med List Status: <None>   Medication Order Taking? Sig Documenting Provider Last Dose Status Informant  amLODipine (NORVASC) 2.5 MG tablet [id number] Yes Take 1 tablet (2.5 mg total) by mouth daily. [name], MD Taking Active   ASPIRIN LOW DOSE 81 MG EC tablet [id number] Yes TAKE 1 TABLET BY MOUTH  DAILY (SWALLOW WHOLE) [name], MD Taking Active   blood glucose meter kit and supplies [id number]  Dispense based on patient and insurance preference. Use up to four times daily as directed. (FOR ICD-10 E10.9, E11.9). Use to check blood sugar three times daily [name], MD  Active   Continuous Blood Gluc Sensor (FREESTYLE LIBRE 2 SENSOR) [place] [id number]  Use to check sugar at least 4 times daily [name], MD  Active   empagliflozin (JARDIANCE) 25 MG TABS tablet [id number] Yes Take 1 tablet (25 mg total) by mouth daily. [name], MD Taking Active   furosemide (LASIX) 20 MG tablet [id number] Yes Take 1 tablet (20 mg total) by mouth every other day. [name], MD Taking Active   gabapentin (NEURONTIN) 100 MG capsule [id number] Yes Take 1 capsule (100 mg total) by mouth 3 (three) times daily. [name], MD Taking Active   insulin aspart (NOVOLOG FLEXPEN) 100 UNIT/ML FlexPen [id number] Yes Inject up to 25 units with breakfast, 25 units with lunch, and 30 units with supper [name], MD Taking Active   insulin degludec (TRESIBA FLEXTOUCH) 200 UNIT/ML FlexTouch Pen [id number] Yes Inject 88 Units into the skin daily.  Patient taking differently: Inject 86 Units into the skin daily.   [name], MD Taking Active   Insulin Pen Needle 32G X 6 MM MISC [id number]  Use daily with insulin pen [name], MD  Active   Lancets (FREESTYLE) lancets [id number]  1  each by Other route 2 (two) times daily. DX 250.02 [name], MD  Active   losartan (COZAAR) 100 MG tablet [id number] Yes Take 1 tablet (100 mg total) by mouth daily. [name], MD Taking Active   [facility] VERIO test strip [id number]  USE TO CHECK BLOOD SUGAR 3  TIMES DAILY [name], MD  Active   rosuvastatin (CRESTOR) 10 MG tablet [id number] Yes Take 1 tablet every other day [name], MD Taking Active   Semaglutide, 1 MG/DOSE, (OZEMPIC, 1 MG/DOSE,) 2 MG/1.5ML SOPN [id number] Yes  Inject 1 mg into the skin once a week. [name], MD Taking Active   tamsulosin [facility]) 0.4 MG CAPS capsule [id number] Yes Take 1 capsule (0.4 mg total) by mouth daily. [name], MD Taking Active   traMADol Veatrice [place]) 50 MG tablet [id number] Yes Take 1 tablet (50 mg total) by mouth every 6 (six) hours as needed. [name], MD Taking Active             Patient Active Problem List   Diagnosis Date Noted   Pain of left shoulder joint on movement 08/27/2020   Fracture of one rib, left side, subsequent encounter for fracture with routine healing 08/27/2020   Personal history of COVID-19 08/25/2020   Lower extremity edema 12/06/2018   Esophageal stricture 12/06/2018   Osteoarthritis 03/28/2018   Myalgia due to statin 02/08/2015   Colon cancer screening 07/30/2014   Hypertension 05/16/2013   Encounter for preventive health examination 05/16/2013   OSA on CPAP 05/14/2013   Spinal stenosis of lumbar region with radiculopathy 12/13/2011   Normal cardiac stress test    Microalbuminuria due to type 2 diabetes mellitus ([place]) 08/18/2011   Hyperlipidemia with target LDL less than 70 08/18/2011   Morbid obesity with BMI of 45.0-49.9, adult ([place]) 08/18/2011   DM (diabetes mellitus), type 2, uncontrolled ([place])     Immunization History  Administered Date(s) Administered   Tdap 10/16/2011    Conditions to be addressed/monitored: HTN, HLD and DMII  Care Plan : Medication Management   Updates made by Nwogu, Ivy A, RPH since 10/02/2020 12:00 AM     Problem: Diabetes, Hypertension, Hyperlipidemia      Long-Range Goal: Disease Progression Prevention   This Visit's Progress: On track  Recent Progress: On track  Priority: High  Note:   Current Barriers:  Unable to independently afford treatment regimen Unable to achieve control of diabetes, hyperlipidemia   Pharmacist Clinical Goal(s):  Over the next 90 days, patient will verbalize ability to afford treatment regimen. Over the next 90 days, patient will  achieve control of diabetes as evidenced by improvement in A1c through collaboration with [name] and provider  Interventions: 1:1 collaboration with [name], MD regarding development and update of comprehensive plan of care as evidenced by provider attestation and co-signature Inter-disciplinary care team collaboration (see longitudinal plan of care) Comprehensive medication review performed; medication list updated in electronic medical record  Diabetes: Uncontrolled; current treatment: Ozempic 1 mg weekly (Mondays), Tresiba U200 86 units daily, Novolog 20 units w/ breakfast, 20 units w/lunch and 30 unit w/dinner other meals; Jardiance 25 mg daily Hx intolerance to metformin Glucose readings - Uses CGM Libre Date of Download: September 19, 2020 - October 02, 2020 % Time CGM is active: 98% Average Glucose: 173 mg/dL Glucose Management Indicator: 7.4%  Glucose Variability: 24.8% (goal <36%) Time in Goal:  - Time in range 70-180: 64% - Time above range: 36% - Time below range: 0% Observed patterns: High after meals  Diet: Eats ~1 meal/day; usually skips breakfast and lunch; normally has 2 cups of coffee with creamer; Dinner: Location manager or chinese, chicken with sauteed green beans, vege burger; snacks: cookies, slim jim [place] sticks, nuts, beef jerky, cheese and crackers; drinks - water 90% of the time, coffee Exercise - limited due to chronic back pain and  arthritis  Discussed importance of moderation in carbohydrate portion sizes Recommended to increase Novolog to 25 units w/ breakfast, 25 units w/lunch and 35 units w/dinner other meal. Continued Tyler Aas  86 units daily, Ozempic 1 mg weekly, and Jardiance 25 mg daily  Hypertension: Controlled; current treatment: losartan 100 mg daily, amlodipine 2.5 mg daily, furosemide 20 mg daily Previously recommended to continue current regimen with home BP checks  Refills sent for Losartan 100 mg daily to [facility] mail order pharmacy per patient request  Hyperlipidemia: Uncontrolled though likely improved; current treatment: rosuvastatin 10 mg every other day Discussed minimizing carbohydrate, sugar and alcohol intake regarding elevated triglycerides Can consider increasing rosuvastatin to daily administration pending f/u lipid panel in the future.  Antiplatelet regimen: aspirin 81 mg daily   Peripheral Neuropathy: Appropriately controlled; current regimen: gabapentin 100 mg TID Recommended to continue current regimen.  Patient Goals/Self-Care Activities Over the next 90 days, patient will:  - take medications as prescribed check blood glucose at least TID using CGM, document, and provide at future appointments collaborate with provider on medication access solutions  Follow Up Plan: Telephone follow up appointment with care management team member scheduled for: ~ 4 weeks     Medication Assistance:   Joni Reining, Novolog obtained through [employer] medication assistance program.  Enrollment ends 06/13/21 . Patient over income for Jardiance patient assistance  Patient's preferred pharmacy is:  [facility] [address], [place] - [address] [place] [address] Phone: [phone] Fax: [facility] [place], [place] [address] Phone: [phone] Fax: [phone]  [facility] [place], [place] - [address] [place] [address] Phone: [phone] Fax: [phone]   Follow Up:  Patient agrees to Care Plan and Follow-up.  Plan: Face to Face appointment with care management team member scheduled for: ~4 weeks  Lorel Monaco, [name], BCPS PGY2 [place]   I was present for this visit and agree with the documentation by the resident as above.   [name], [name], [place], [place] Clinical Pharmacist [employer] at [place]

## 2020-10-02 NOTE — Patient Instructions (Addendum)
Nice meeting you today!  Increase Novolog to 25 units with breakfast, 25 units with lunch, and 35 units with dinner  Continue Tresiba 86 units daily  Continue Jardiance 25 mg daily   Please call if you have any concerns/questions -Lorel Monaco, [name]   Visit Information  PATIENT GOALS: Goals Addressed              This Visit's Progress   .  Medication Management (pt-stated)        Patient Goals/Self-Care Activities . Over the next 90 days, patient will:  - take medications as prescribed check blood glucose at least TID using CGM, document, and provide at future appointments collaborate with provider on medication access solutions       Print copy of patient instructions, educational materials, and care plan provided in person.  Plan: Face to Face appointment with care management team member scheduled for: ~4 weeks  Lorel Monaco, [name], [place]

## 2020-10-04 ENCOUNTER — Telehealth: Payer: Self-pay | Admitting: Internal Medicine

## 2020-10-04 DIAGNOSIS — I152 Hypertension secondary to endocrine disorders: Secondary | ICD-10-CM

## 2020-10-04 MED ORDER — LOSARTAN POTASSIUM 100 MG PO TABS: 100.0000 mg | ORAL_TABLET | Freq: Every day | ORAL | 1 refills | Status: DC

## 2020-10-04 NOTE — Telephone Encounter (Signed)
[facility] called in refill for patient for losartan (COZAAR) 100 MG tablet

## 2020-10-09 ENCOUNTER — Other Ambulatory Visit: Payer: Self-pay

## 2020-10-09 DIAGNOSIS — I152 Hypertension secondary to endocrine disorders: Secondary | ICD-10-CM

## 2020-10-09 MED ORDER — LOSARTAN POTASSIUM 100 MG PO TABS: 100.0000 mg | ORAL_TABLET | Freq: Every day | ORAL | 1 refills | Status: DC

## 2020-10-09 NOTE — Telephone Encounter (Signed)
Pt called and is upset that losartan (COZAAR) 100 MG tablet was sent to the wrong pharmacy. PT NEEDS THIS SENT TO [facility]!!!! He states that [facility] Rossetti is not wanting to refill this because it was sent to [facility]. PLEASE TAKE [facility] OUT OF CHART

## 2020-10-21 IMAGING — DX DG ABDOMEN 1V
2 series · 2 of 2 positions shown · non-contrast
Comparison: 01/16/2013

CLINICAL DATA: Left-sided kidney pain

EXAM:
ABDOMEN - 1 VIEW

[abdomen standing ap (1 of 2)]
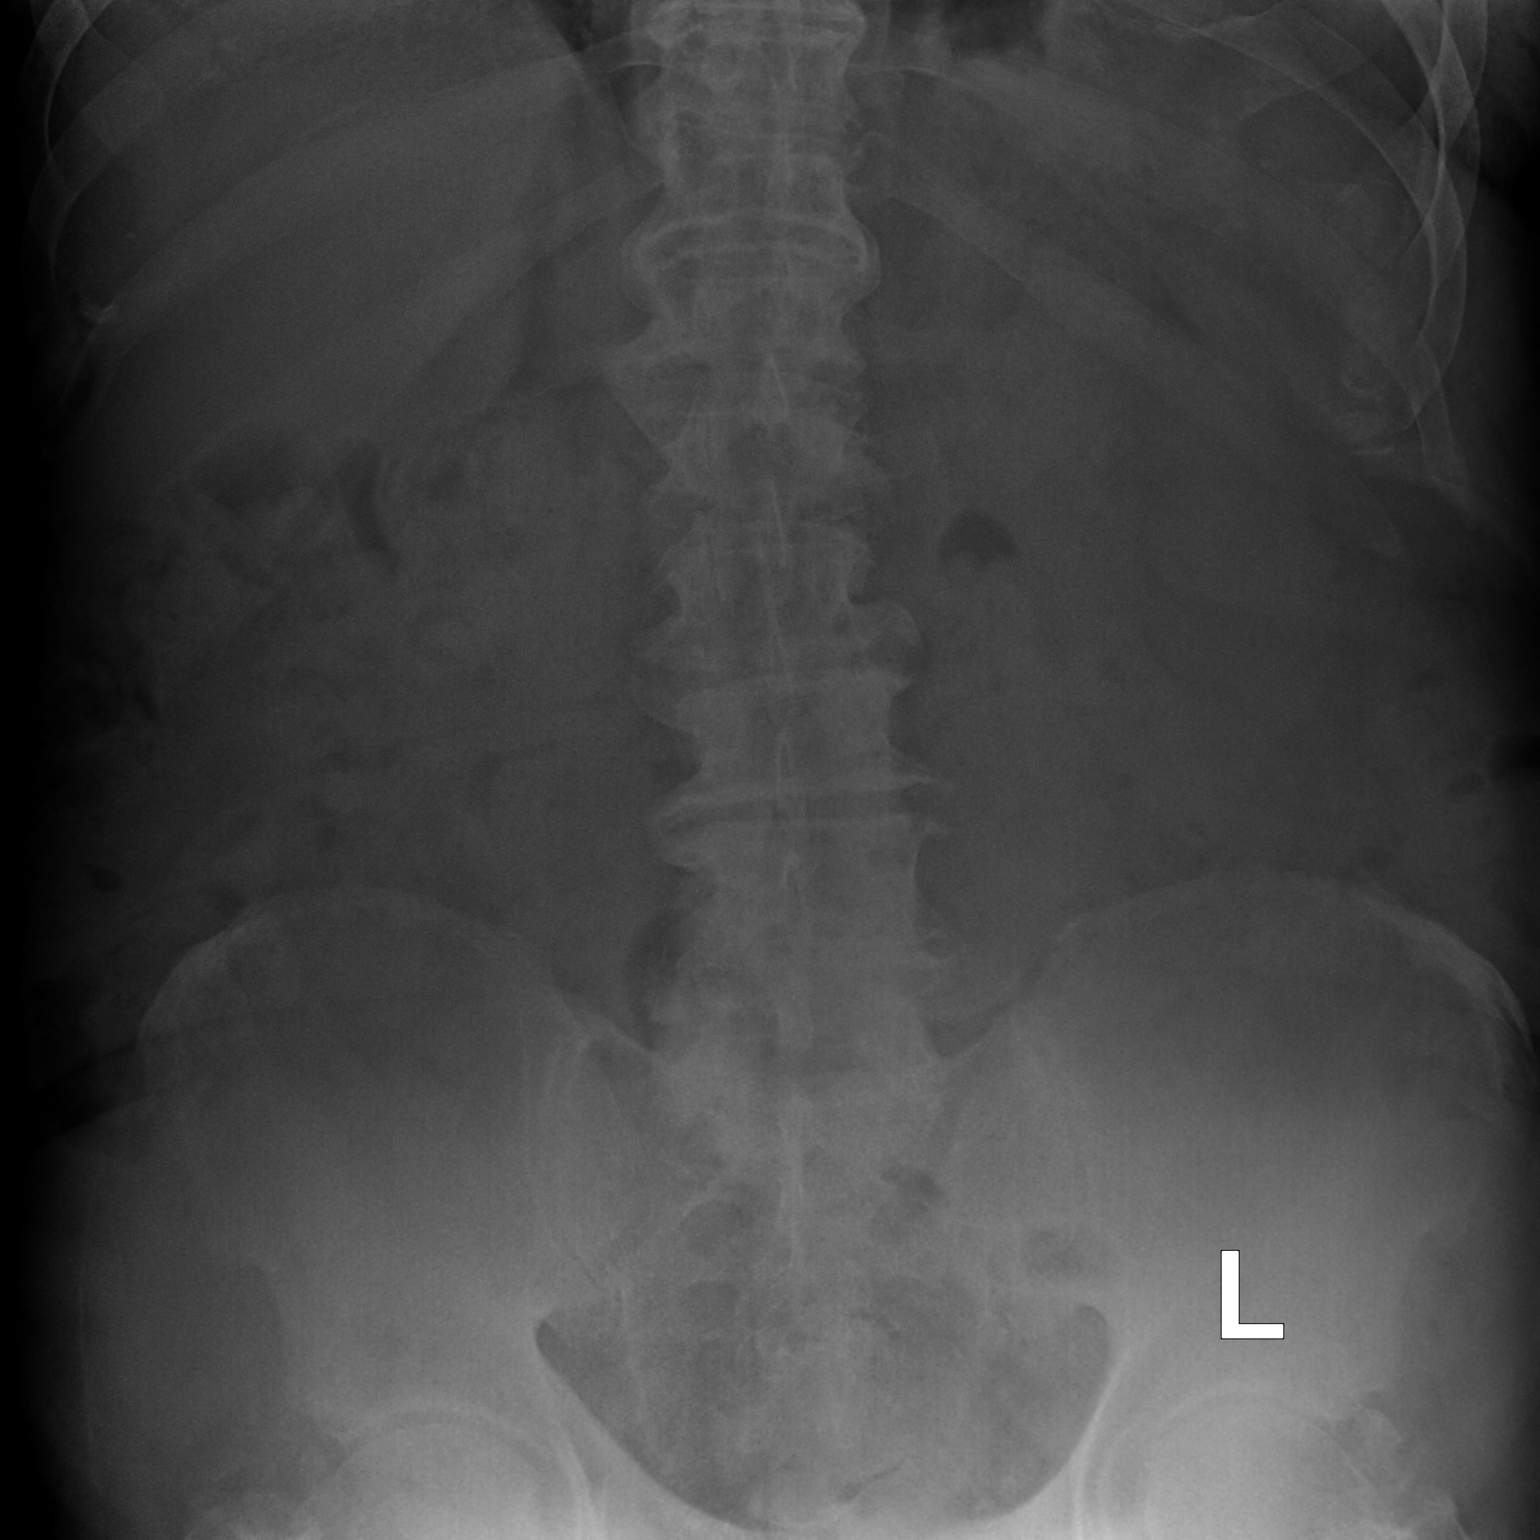

[abdomen standing ap (2 of 2)]
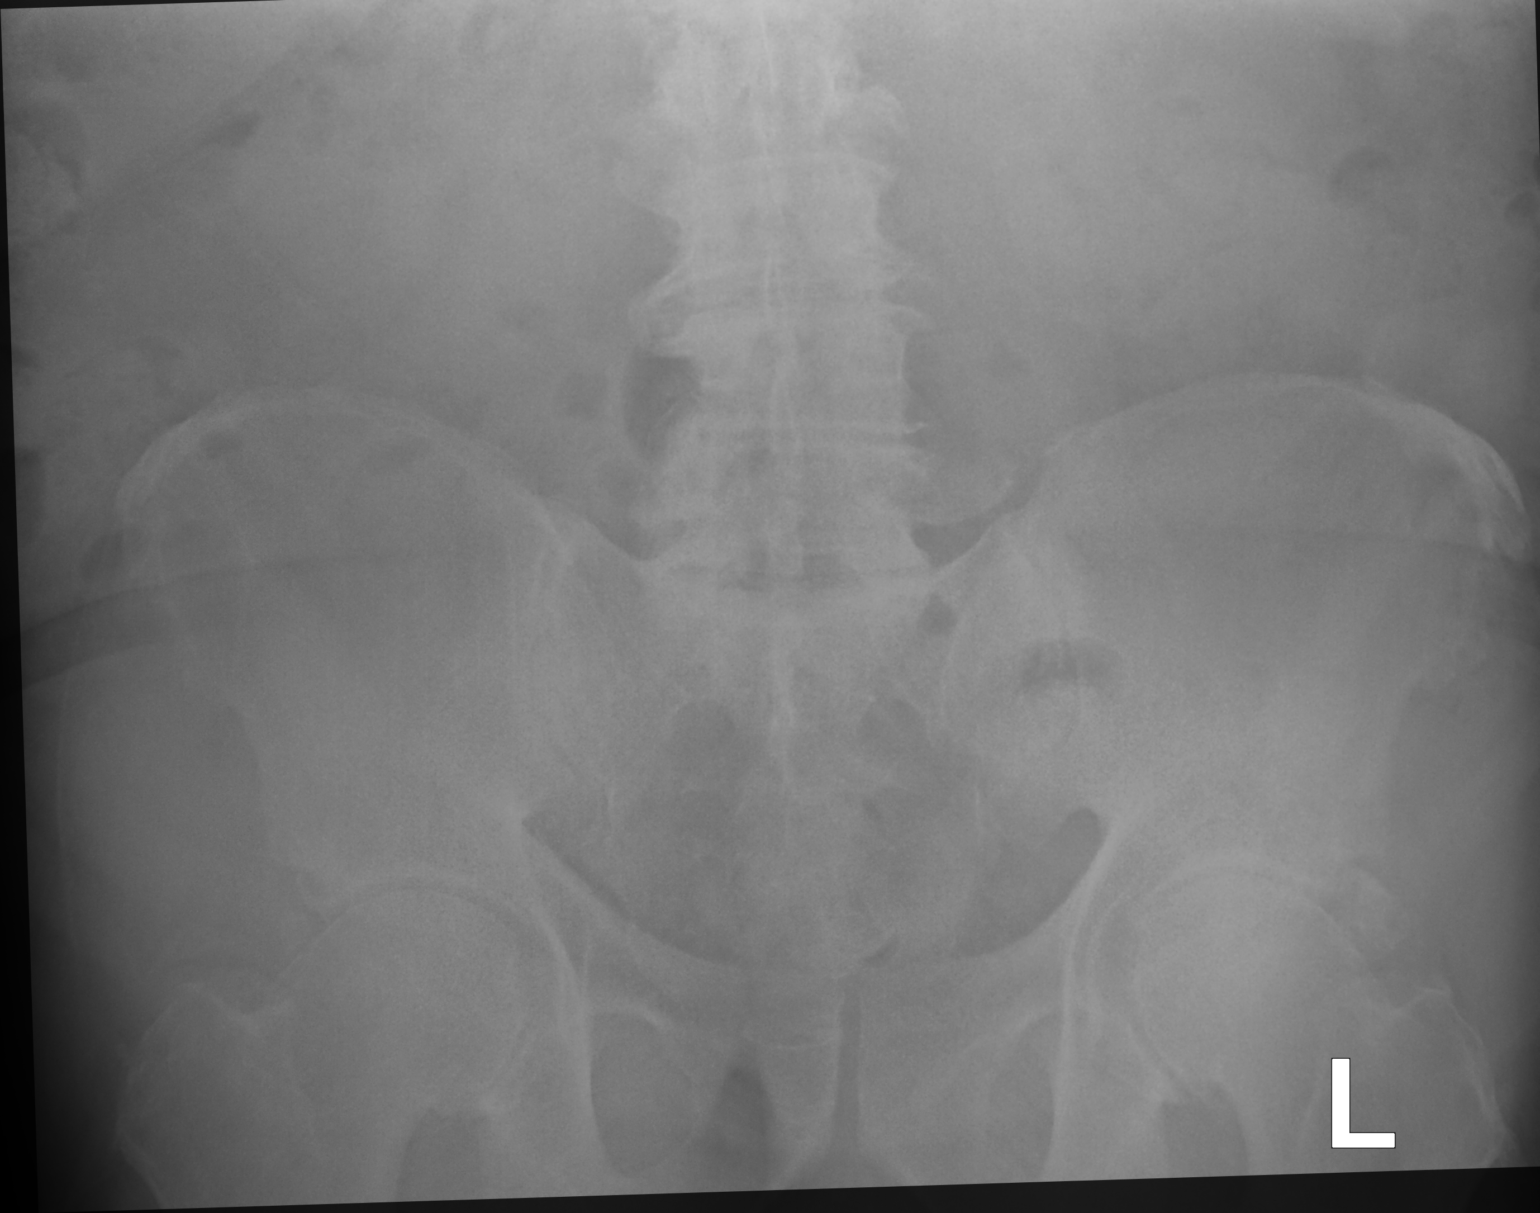

[2 of 2 positions shown; findings below may reference images not displayed]

FINDINGS: Scattered large and small bowel gas is noted. No abnormal mass or
abnormal calcifications are seen. Degenerative changes of lumbar
spine are noted progressed from the prior exam.
IMPRESSION: No definitive calculi are identified.

Degenerative changes of lumbar spine.

## 2020-10-23 DIAGNOSIS — E1165 Type 2 diabetes mellitus with hyperglycemia: Secondary | ICD-10-CM | POA: Diagnosis not present

## 2020-10-31 ENCOUNTER — Other Ambulatory Visit: Payer: Self-pay

## 2020-10-31 ENCOUNTER — Ambulatory Visit (INDEPENDENT_AMBULATORY_CARE_PROVIDER_SITE_OTHER): Payer: Medicare HMO | Admitting: Pharmacist

## 2020-10-31 DIAGNOSIS — I152 Hypertension secondary to endocrine disorders: Secondary | ICD-10-CM

## 2020-10-31 DIAGNOSIS — M48061 Spinal stenosis, lumbar region without neurogenic claudication: Secondary | ICD-10-CM

## 2020-10-31 DIAGNOSIS — M5416 Radiculopathy, lumbar region: Secondary | ICD-10-CM

## 2020-10-31 DIAGNOSIS — E1165 Type 2 diabetes mellitus with hyperglycemia: Secondary | ICD-10-CM | POA: Diagnosis not present

## 2020-10-31 DIAGNOSIS — E785 Hyperlipidemia, unspecified: Secondary | ICD-10-CM

## 2020-10-31 MED ORDER — EMPAGLIFLOZIN 25 MG PO TABS: 25.0000 mg | ORAL_TABLET | Freq: Every day | ORAL | 3 refills | Status: DC

## 2020-10-31 MED ORDER — FUROSEMIDE 20 MG PO TABS: 20.0000 mg | ORAL_TABLET | ORAL | 3 refills | Status: DC

## 2020-10-31 NOTE — Patient Instructions (Addendum)
It was great to see you today!  Keep up the fantastic work! Once the higher dose of Ozempic is available, we can discuss increasing to 2 mg weekly to allow for lower doses of insulin.   We can also see your next lab work, but we will likely plan to increase rosuvastatin to daily administration to target an LDL <70.   Keep me posted with any questions or concerns!  [name], [name] [phone]  Visit Information  PATIENT GOALS: Goals Addressed              This Visit's Progress     Patient Stated   .  Medication Management (pt-stated)        Patient Goals/Self-Care Activities . Over the next 90 days, patient will:  - take medications as prescribed check blood glucose at least three times daily using CGM, document, and provide at future appointments collaborate with provider on medication access solutions       Print copy of patient instructions, educational materials, and care plan provided in person.  Plan: Telephone follow up appointment with care management team member scheduled for:  ~ 12 weeks  [name], [name], [place], [place] Clinical Pharmacist [employer] at [employer] [phone]

## 2020-10-31 NOTE — Chronic Care Management (AMB) (Signed)
Chronic Care Management Pharmacy Note  10/31/2020 Name:  Connor Ross MRN:  [id number] DOB:  12-12-1965  Subjective: Connor Ross is an 55 y.o. year old male who is a primary patient of [name], [name], MD.  The CCM team was consulted for assistance with disease management and care coordination needs.    Engaged with patient by telephone for follow up visit in response to provider referral for pharmacy case management and/or care coordination services.   Consent to Services:  The patient was given information about Chronic Care Management services, agreed to services, and gave verbal consent prior to initiation of services.  Please see initial visit note for detailed documentation.   Patient Care Team: [name], MD as PCP - General (Internal Medicine) De Hollingshead, RPH-CPP as Pharmacist (Pharmacist)  Recent office visits: None since our last visit  Recent consult visits: None since our last visit  Hospital visits: None in previous 6 months  Objective:  Lab Results  Component Value Date   CREATININE 0.75 08/22/2020   CREATININE 0.75 05/04/2020   CREATININE 0.70 01/05/2020    Lab Results  Component Value Date   HGBA1C 8.1 (H) 08/22/2020   Last diabetic Eye exam:  Lab Results  Component Value Date/Time   HMDIABEYEEXA No Retinopathy 05/10/2020 12:00 AM    Last diabetic Foot exam:  Lab Results  Component Value Date/Time   HMDIABFOOTEX normal 02/08/2015 12:00 AM        Component Value Date/Time   CHOL 163 08/22/2020 0813   TRIG 288.0 (H) 08/22/2020 0813   HDL 23.90 (L) 08/22/2020 0813   CHOLHDL 7 08/22/2020 0813   VLDL 57.6 (H) 08/22/2020 0813   LDLCALC 173 (H) 04/19/2019 0841   LDLDIRECT 90.0 08/22/2020 0813    Hepatic Function Latest Ref Rng & Units 08/22/2020 05/04/2020 01/05/2020  Total Protein 6.0 - 8.3 g/dL 7.0 6.6 7.5  Albumin 3.5 - 5.2 g/dL 4.1 4.3 4.7  AST 0 - 37 U/L _0 ALT 0 - 53 U/L _1 Alk  Phosphatase 39 - 117 U/L 65 72 68  Total Bilirubin 0.2 - 1.2 mg/dL 0.5 0.4 0.4    Lab Results  Component Value Date/Time   TSH 2.43 04/19/2019 08:41 AM   TSH 3.28 05/10/2015 10:44 AM    CBC Latest Ref Rng & Units 04/19/2019 02/06/2015 04/03/2012  WBC 4.0 - 10.5 K/uL 7.8 7.2 8.7  Hemoglobin 13.0 - 17.0 g/dL 14.9 14.6 15.6  Hematocrit 39.0 - 52.0 % 44.9 43.6 45.5  Platelets 150.0 - 400.0 K/uL 259.0 241.0 228    Clinical ASCVD: No  The 10-year ASCVD risk score Mikey Bussing DC Jr., et al., 2013) is: 21.4%   Values used to calculate the score:     Age: 66 years     Sex: Male     Is Non-Hispanic African American: No     Diabetic: Yes     Tobacco smoker: No     Systolic Blood Pressure: 226 mmHg     Is BP treated: Yes     HDL Cholesterol: 23.9 mg/dL     Total Cholesterol: 163 mg/dL    Other: (CHADS2VASc if Afib, PHQ9 if depression, MMRC or CAT for COPD, ACT, DEXA)  Social History   Tobacco Use  Smoking Status Never Smoker  Smokeless Tobacco Never Used   BP Readings from Last 3 Encounters:  08/25/20 136/76  06/12/20 122/64  05/08/20 (!) 146/78   Pulse Readings from Last  3 Encounters:  08/25/20 91  05/08/20 78  01/05/20 89   Wt Readings from Last 3 Encounters:  08/25/20 (!) 314 lb 3.2 oz (142.5 kg)  05/08/20 (!) 319 lb 3.2 oz (144.8 kg)  01/05/20 (!) 327 lb (148.3 kg)    Assessment: Review of patient past medical history, allergies, medications, health status, including review of consultants reports, laboratory and other test data, was performed as part of comprehensive evaluation and provision of chronic care management services.   SDOH:  (Social Determinants of Health) assessments and interventions performed:    CCM Care Plan  Allergies  Allergen Reactions  . Hctz [Hydrochlorothiazide] Other (See Comments)    Extreme Cramping  . Metformin And Related Diarrhea  . Statins     Medications Reviewed Today    Reviewed by De Hollingshead, RPH-CPP (Pharmacist) on  10/31/20 at 1413  Med List Status: <None>  Medication Order Taking? Sig Documenting Provider Last Dose Status Informant  amLODipine (NORVASC) 2.5 MG tablet [id number] Yes Take 1 tablet (2.5 mg total) by mouth daily. [name], MD Taking Active   ASPIRIN LOW DOSE 81 MG EC tablet [id number] Yes TAKE 1 TABLET BY MOUTH  DAILY (SWALLOW WHOLE) [name], MD Taking Active   blood glucose meter kit and supplies [id number] Yes Dispense based on patient and insurance preference. Use up to four times daily as directed. (FOR ICD-10 E10.9, E11.9). Use to check blood sugar three times daily [name], MD Taking Active   Continuous Blood Gluc Sensor (FREESTYLE LIBRE 2 SENSOR) [place] [id number] Yes Use to check sugar at least 4 times daily [name], MD Taking Active   empagliflozin (JARDIANCE) 25 MG TABS tablet [id number] Yes Take 1 tablet (25 mg total) by mouth daily. [name], MD Taking Active   furosemide (LASIX) 20 MG tablet [id number] Yes Take 1 tablet (20 mg total) by mouth every other day. [name], MD Taking Active   gabapentin (NEURONTIN) 100 MG capsule [id number] Yes Take 1 capsule (100 mg total) by mouth 3 (three) times daily. [name], MD Taking Active   insulin aspart (NOVOLOG FLEXPEN) 100 UNIT/ML FlexPen [id number] Yes Inject up to 25 units with breakfast, 25 units with lunch, and 30 units with supper [name], MD Taking Active            Med Note ([place] Oct 31, 2020  2:02 PM) Breakfast: 20-25, Lunch: 20-25; Supper: 30-35   insulin degludec (TRESIBA FLEXTOUCH) 200 UNIT/ML FlexTouch Pen [id number] Yes Inject 88 Units into the skin daily.  Patient taking differently: Inject 86 Units into the skin daily.   [name], MD Taking Active            Med Note ([place] Oct 31, 2020  2:00 PM) 86-88 units  Insulin Pen Needle 32G X 6 MM MISC [id number] Yes Use daily with insulin pen [name], MD Taking Active   Lancets  (FREESTYLE) lancets [id number] Yes 1 each by Other route 2 (two) times daily. DX 250.02 [name], MD Taking Active   losartan (COZAAR) 100 MG tablet [id number] Yes Take 1 tablet (100 mg total) by mouth daily. [name], MD Taking Active   [facility] VERIO test strip [id number] Yes USE TO CHECK BLOOD SUGAR 3  TIMES DAILY [name], MD Taking Active   rosuvastatin (CRESTOR) 10 MG tablet [id number] Yes Take 1 tablet every other day  [name], MD Taking Active   Semaglutide, 1 MG/DOSE, (OZEMPIC, 1 MG/DOSE,) 2 MG/1.5ML SOPN [id number] Yes Inject 1 mg into the skin once a week. [name], MD Taking Active   tamsulosin [facility]) 0.4 MG CAPS capsule [id number] Yes Take 1 capsule (0.4 mg total) by mouth daily. [name], MD Taking Active   traMADol Veatrice [place]bon) 50 MG tablet [id number] Yes Take 1 tablet (50 mg total) by mouth every 6 (six) hours as needed. [name], MD Taking Active           Patient Active Problem List   Diagnosis Date Noted  . Pain of left shoulder joint on movement 08/27/2020  . Fracture of one rib, left side, subsequent encounter for fracture with routine healing 08/27/2020  . Personal history of COVID-19 08/25/2020  . Lower extremity edema 12/06/2018  . Esophageal stricture 12/06/2018  . Osteoarthritis 03/28/2018  . Myalgia due to statin 02/08/2015  . Colon cancer screening 07/30/2014  . Hypertension 05/16/2013  . Encounter for preventive health examination 05/16/2013  . OSA on CPAP 05/14/2013  . Spinal stenosis of lumbar region with radiculopathy 12/13/2011  . Normal cardiac stress test   . Microalbuminuria due to type 2 diabetes mellitus ([place]) 08/18/2011  . Hyperlipidemia with target LDL less than 70 08/18/2011  . Morbid obesity with BMI of 45.0-49.9, adult ([place]) 08/18/2011  . DM (diabetes mellitus), type 2, uncontrolled ([place])     Immunization History  Administered Date(s) Administered  . Tdap 10/16/2011    Conditions to be  addressed/monitored: HTN, HLD and DMII  Care Plan : Medication Management  Updates made by De Hollingshead, RPH-CPP since 10/31/2020 12:00 AM    Problem: Diabetes, Hypertension, Hyperlipidemia     Long-Range Goal: Disease Progression Prevention   This Visit's Progress: On track  Recent Progress: On track  Priority: High  Note:   Current Barriers:  . Unable to independently afford treatment regimen . Unable to achieve control of diabetes, hyperlipidemia   Pharmacist Clinical Goal(s):  [name] Over the next 90 days, patient will verbalize ability to afford treatment regimen. . Over the next 90 days, patient will  achieve control of diabetes as evidenced by improvement in A1c through collaboration with [name] and provider  Interventions: . 1:1 collaboration with [name], MD regarding development and update of comprehensive plan of care as evidenced by provider attestation and co-signature . Inter-disciplinary care team collaboration (see longitudinal plan of care) . Comprehensive medication review performed; medication list updated in electronic medical record  Diabetes: . Uncontrolled but anticipated to be improved per CGM readings; current treatment: Ozempic 1 mg weekly (Mondays), Tyler Aas U200 86-88 units daily, Novolog 30-35 units w/ breakfast, 20 units w/lunch and 30-35 unit w/dinner; Jardiance 25 mg daily o Hx intolerance to metformin . Glucose readings - Uses CGM Libre . Date of Download: 10/18/2020-10/31/20 . % Time CGM is active: 99% . Average Glucose: 126 mg/dL . Glucose Management Indicator: 6.3%  . Glucose Variability: 31.5% (goal <36%) . Time in Goal:  . - Time in range 70-180: 88% . - Time above range: 9% . - Time below range: 0%  . Diet: Eats ~1 meal/day; usually skips breakfast and lunch; normally has 2 cups of coffee with creamer; Dinner: Location manager or chinese, chicken with sauteed green beans, vege burger; snacks: cookies, slim jim [place] sticks, nuts, beef  jerky, cheese and crackers; drinks - water 90% of the time, coffee . Exercise - limited due to chronic back pain  and arthritis  . Recommended to continue current regimen at this time. Lab work scheduled in ~ 2 weeks.  . Discussed increasing Ozempic to 2 mg (once available through [employer] PAP) to minimize insulin need, improve weight loss.  . Requested refill on Jardiance. Script sent today.   Hypertension: . Controlled; current treatment: losartan 100 mg daily, amlodipine 2.5 mg daily, furosemide 20 mg every other daily . Requested refill on furosemide today. Script sent  . Continue current regimen at this time  Hyperlipidemia: . Uncontrolled; current treatment: rosuvastatin 10 mg every other day . Recommend increasing rosuvastatin to target goal LDL <70.   [name] Antiplatelet regimen: aspirin 81 mg daily  . Continue current regimen at this time. Lipid panel and PCP visit in the next several weeks.   Peripheral Neuropathy: . Appropriately controlled; current regimen: gabapentin 100 mg TID . Recommended to continue current regimen.  Patient Goals/Self-Care Activities . Over the next 90 days, patient will:  - take medications as prescribed check blood glucose at least at least three times daily using CGM, document, and provide at future appointments collaborate with provider on medication access solutions  Follow Up Plan: Telephone follow up appointment with care management team member scheduled for: ~ 12 weeks     Medication Assistance: Joni Reining, Novolog obtained through [employer] medication assistance program.  Enrollment ends 07/14/21  Patient's preferred pharmacy is:  [facility] - [address], [place] [address] Phone: [phone] Fax: [phone]  [id number] [place], [place] - [address] [place] [address] Phone: [phone]  Fax: [phone]   Follow Up:  Patient agrees to Care Plan and Follow-up.  Plan: Telephone follow up appointment with care management team member scheduled for:  ~ 12 weeks  [name], [name], [place], [place] Clinical Pharmacist [employer] [facility] [employer] [phone]

## 2020-11-16 ENCOUNTER — Telehealth: Payer: Self-pay

## 2020-11-16 NOTE — Telephone Encounter (Signed)
I called patient to let him know that we received his patient assistance medications from [employer]:  -2 boxes of Novofine pen needles -6 boxes of Tresiba -4 boxes of Ozempic  All boxes have been labled & placed in our refrigerator. Patient will pick up tomorrow.

## 2020-11-21 ENCOUNTER — Other Ambulatory Visit: Payer: Self-pay

## 2020-11-21 ENCOUNTER — Other Ambulatory Visit (INDEPENDENT_AMBULATORY_CARE_PROVIDER_SITE_OTHER): Payer: Medicare HMO

## 2020-11-21 DIAGNOSIS — E1165 Type 2 diabetes mellitus with hyperglycemia: Secondary | ICD-10-CM

## 2020-11-21 DIAGNOSIS — Z8616 Personal history of COVID-19: Secondary | ICD-10-CM | POA: Diagnosis not present

## 2020-11-21 LAB — COMPREHENSIVE METABOLIC PANEL
ALT: 18 U/L (ref 0–53)
AST: 11 U/L (ref 0–37)
Albumin: 4.3 g/dL (ref 3.5–5.2)
Alkaline Phosphatase: 73 U/L (ref 39–117)
BUN: 15 mg/dL (ref 6–23)
CO2: 28 mEq/L (ref 19–32)
Calcium: 9.1 mg/dL (ref 8.4–10.5)
Chloride: 101 mEq/L (ref 96–112)
Creatinine, Ser: 0.76 mg/dL (ref 0.40–1.50)
GFR: 100.33 mL/min (ref >60.00)
Glucose, Bld: 128 mg/dL — ABNORMAL HIGH (ref 70–99)
Potassium: 4.2 mEq/L (ref 3.5–5.1)
Sodium: 137 mEq/L (ref 135–145)
Total Bilirubin: 0.4 mg/dL (ref 0.2–1.2)
Total Protein: 6.9 g/dL (ref 6.0–8.3)

## 2020-11-21 LAB — LDL CHOLESTEROL, DIRECT: Direct LDL: 119 mg/dL

## 2020-11-21 LAB — LIPID PANEL
Cholesterol: 196 mg/dL (ref 0–200)
HDL: 31 mg/dL — ABNORMAL LOW (ref >39.00)
NonHDL: 165.17
Total CHOL/HDL Ratio: 6
Triglycerides: 223 mg/dL — ABNORMAL HIGH (ref 0.0–149.0)
VLDL: 44.6 mg/dL — ABNORMAL HIGH (ref 0.0–40.0)

## 2020-11-21 LAB — HEMOGLOBIN A1C: Hgb A1c MFr Bld: 7.8 % — ABNORMAL HIGH (ref 4.6–6.5)

## 2020-11-23 DIAGNOSIS — E1165 Type 2 diabetes mellitus with hyperglycemia: Secondary | ICD-10-CM | POA: Diagnosis not present

## 2020-11-23 LAB — SARS-COV-2 SEMI-QUANTITATIVE TOTAL ANTIBODY, SPIKE: SARS COV2 AB, Total Spike Semi QN: 64.3 U/mL — ABNORMAL HIGH (ref <0.8)

## 2020-11-24 ENCOUNTER — Other Ambulatory Visit: Payer: Self-pay | Admitting: Internal Medicine

## 2020-11-24 ENCOUNTER — Ambulatory Visit: Payer: Medicare HMO | Admitting: Internal Medicine

## 2020-11-24 DIAGNOSIS — M48061 Spinal stenosis, lumbar region without neurogenic claudication: Secondary | ICD-10-CM

## 2020-11-24 DIAGNOSIS — M5416 Radiculopathy, lumbar region: Secondary | ICD-10-CM

## 2020-11-24 NOTE — Telephone Encounter (Signed)
Last fill 09/07/20 patient has upcoming appointment scheduled and last OV 2/22

## 2020-11-29 ENCOUNTER — Telehealth: Payer: Self-pay

## 2020-11-29 NOTE — Telephone Encounter (Signed)
Patient assistance arrived today. 5 bx of novolog flex pen. patient has been notified to pick up.

## 2020-12-04 ENCOUNTER — Encounter: Payer: Self-pay | Admitting: Internal Medicine

## 2020-12-04 ENCOUNTER — Ambulatory Visit (INDEPENDENT_AMBULATORY_CARE_PROVIDER_SITE_OTHER): Payer: Medicare HMO | Admitting: Internal Medicine

## 2020-12-04 ENCOUNTER — Other Ambulatory Visit: Payer: Self-pay

## 2020-12-04 VITALS — BP 132/66 | HR 92 | Temp 96.8°F | Resp 16 | Ht 70.0 in | Wt 319.2 lb

## 2020-12-04 DIAGNOSIS — M5416 Radiculopathy, lumbar region: Secondary | ICD-10-CM

## 2020-12-04 DIAGNOSIS — E1129 Type 2 diabetes mellitus with other diabetic kidney complication: Secondary | ICD-10-CM | POA: Diagnosis not present

## 2020-12-04 DIAGNOSIS — E785 Hyperlipidemia, unspecified: Secondary | ICD-10-CM

## 2020-12-04 DIAGNOSIS — R809 Proteinuria, unspecified: Secondary | ICD-10-CM | POA: Diagnosis not present

## 2020-12-04 DIAGNOSIS — Z125 Encounter for screening for malignant neoplasm of prostate: Secondary | ICD-10-CM | POA: Diagnosis not present

## 2020-12-04 DIAGNOSIS — E1165 Type 2 diabetes mellitus with hyperglycemia: Secondary | ICD-10-CM

## 2020-12-04 DIAGNOSIS — Z6841 Body Mass Index (BMI) 40.0 and over, adult: Secondary | ICD-10-CM | POA: Diagnosis not present

## 2020-12-04 DIAGNOSIS — Z794 Long term (current) use of insulin: Secondary | ICD-10-CM | POA: Diagnosis not present

## 2020-12-04 DIAGNOSIS — M48061 Spinal stenosis, lumbar region without neurogenic claudication: Secondary | ICD-10-CM

## 2020-12-04 MED ORDER — ROSUVASTATIN CALCIUM 10 MG PO TABS: ORAL_TABLET | ORAL | 3 refills | Status: DC

## 2020-12-04 MED ORDER — OZEMPIC (1 MG/DOSE) 2 MG/1.5ML ~~LOC~~ SOPN: 2.0000 mg | PEN_INJECTOR | SUBCUTANEOUS | 2 refills | Status: DC

## 2020-12-04 NOTE — Progress Notes (Addendum)
Subjective:  Patient ID: Connor Ross, male    DOB: 1965/10/03  Age: 55 y.o. MRN: [id number]  CC: The primary encounter diagnosis was Prostate cancer screening. Diagnoses of Uncontrolled type 2 diabetes mellitus with hyperglycemia ([place]), Hyperlipidemia with target LDL less than 70, Microalbuminuria due to type 2 diabetes mellitus ([place]), Spinal stenosis of lumbar region with radiculopathy, and Morbid obesity with BMI of 45.0-49.9, adult [facility]) were also pertinent to this visit.  HPI SYLVAN SOOKDEO presents for follow up on type 2 DM with obesity, hypertension and hyperlipidemia  This visit occurred during the SARS-CoV-2 public health emergency.  Safety protocols were in place, including screening questions prior to the visit, additional usage of staff PPE, and extensive cleaning of exam room while observing appropriate contact time as indicated for disinfecting solutions.    T2DM:  Connor Ross feels generally well but suffers from chronic back pain due to DDD  so  he is  not  Able to exercise regularly and despite his efforts at weight loss, has gained 5 lbs since his last visit in Feburary .  He is using a CBG to check his   blood sugars several times daily. .  BS have been under 130 fasting and < 150 post prandially.  Denies any recent hypoglyemic events.  Taking Jardiance, Ozempic 1 mg dose , Novolog 20 to 25 units in the  am with his coffee,   20 units at lunchtime when he has a snack (lately an [place] prepared mea) ,    And 30 units at dinner  . Also using  Tresiba 88 units daily.   Following a carbohydrate modified diet most of the time. Denies numbness, burning and tingling of extremities but has sciatia managed with gabapentin 100 mg tid . He states that he only eats one large meal daily but does have several snacks daily  . He denies nausea and is willing to increase the Ozempic dose to 2 mg daily if it can be supplied by office.    Hyperlipidemia:  Taking Crestor 10 mg every other  day and tolerating the medication without side effects ; LDL is 119  (goal is < 70) .  He is willing to increase dose to daily as a trial    Outpatient Medications Prior to Visit  Medication Sig Dispense Refill  . amLODipine (NORVASC) 2.5 MG tablet Take 1 tablet (2.5 mg total) by mouth daily. 90 tablet 3  . ASPIRIN LOW DOSE 81 MG EC tablet TAKE 1 TABLET BY MOUTH  DAILY (SWALLOW WHOLE) 90 tablet 1  . blood glucose meter kit and supplies Dispense based on patient and insurance preference. Use up to four times daily as directed. (FOR ICD-10 E10.9, E11.9). Use to check blood sugar three times daily 1 each 0  . Continuous Blood Gluc Sensor (FREESTYLE LIBRE 2 SENSOR) MISC Use to check sugar at least 4 times daily 1 each 2  . empagliflozin (JARDIANCE) 25 MG TABS tablet Take 1 tablet (25 mg total) by mouth daily. 90 tablet 3  . furosemide (LASIX) 20 MG tablet Take 1 tablet (20 mg total) by mouth every other day. 45 tablet 3  . gabapentin (NEURONTIN) 100 MG capsule Take 1 capsule (100 mg total) by mouth 3 (three) times daily. 270 capsule 3  . insulin aspart (NOVOLOG FLEXPEN) 100 UNIT/ML FlexPen Inject up to 25 units with breakfast, 25 units with lunch, and 30 units with supper 72 mL 2  . insulin degludec (TRESIBA FLEXTOUCH) 200 UNIT/ML FlexTouch  Pen Inject 88 Units into the skin daily. (Patient taking differently: Inject 86 Units into the skin daily.) 48 mL 2  . Insulin Pen Needle 32G X 6 MM MISC Use daily with insulin pen 50 each 0  . Lancets (FREESTYLE) lancets 1 each by Other route 2 (two) times daily. DX 250.02 100 each 12  . losartan (COZAAR) 100 MG tablet Take 1 tablet (100 mg total) by mouth daily. 90 tablet 1  . ONETOUCH VERIO test strip USE TO CHECK BLOOD SUGAR 3  TIMES DAILY 300 strip 3  . tamsulosin (FLOMAX) 0.4 MG CAPS capsule Take 1 capsule (0.4 mg total) by mouth daily. 90 capsule 3  . traMADol (ULTRAM) 50 MG tablet TAKE 1 TABLET EVERY 6 HOURS AS NEEDED 90 tablet 1  . rosuvastatin (CRESTOR)  10 MG tablet Take 1 tablet every other day 45 tablet 2  . Semaglutide, 1 MG/DOSE, (OZEMPIC, 1 MG/DOSE,) 2 MG/1.5ML SOPN Inject 1 mg into the skin once a week. 1.5 mL 2   No facility-administered medications prior to visit.    Review of Systems;  Patient denies headache, fevers, malaise, unintentional weight loss, skin rash, eye pain, sinus congestion and sinus pain, sore throat, dysphagia,  hemoptysis , cough, dyspnea, wheezing, chest pain, palpitations, orthopnea, edema, abdominal pain, nausea, melena, diarrhea, constipation, flank pain, dysuria, hematuria, urinary  Frequency, nocturia, numbness, tingling, seizures,  Focal weakness, Loss of consciousness,  Tremor, insomnia, depression, anxiety, and suicidal ideation.      Objective:  BP 132/66 (BP Location: Left Arm, Patient Position: Sitting, Cuff Size: Large)   Pulse 92   Temp (!) 96.8 F (36 C) (Oral)   Resp 16   Ht _0  (1.778 m)   Wt (!) 319 lb 3.2 oz (144.8 kg)   SpO2 97%   BMI 45.80 kg/m   BP Readings from Last 3 Encounters:  12/04/20 132/66  08/25/20 136/76  06/12/20 122/64    Wt Readings from Last 3 Encounters:  12/04/20 (!) 319 lb 3.2 oz (144.8 kg)  08/25/20 (!) 314 lb 3.2 oz (142.5 kg)  05/08/20 (!) 319 lb 3.2 oz (144.8 kg)    General appearance: alert, cooperative and appears stated age Ears: normal TM's and external ear canals both ears Throat: lips, mucosa, and tongue normal; teeth and gums normal Neck: no adenopathy, no carotid bruit, supple, symmetrical, trachea midline and thyroid not enlarged, symmetric, no tenderness/mass/nodules Back: symmetric, no curvature. ROM normal. No CVA tenderness. Lungs: clear to auscultation bilaterally Heart: regular rate and rhythm, S1, S2 normal, no murmur, click, rub or gallop Abdomen: soft, non-tender; bowel sounds normal; no masses,  no organomegaly Pulses: 2+ and symmetric Skin: Skin color, texture, turgor normal. No rashes or lesions Lymph nodes: Cervical,  supraclavicular, and axillary nodes normal.  Lab Results  Component Value Date   HGBA1C 7.8 (H) 11/21/2020   HGBA1C 8.1 (H) 08/22/2020   HGBA1C 7.9 (H) 05/04/2020    Lab Results  Component Value Date   CREATININE 0.76 11/21/2020   CREATININE 0.75 08/22/2020   CREATININE 0.75 05/04/2020    Lab Results  Component Value Date   WBC 7.8 04/19/2019   HGB 14.9 04/19/2019   HCT 44.9 04/19/2019   PLT 259.0 04/19/2019   GLUCOSE 128 (H) 11/21/2020   CHOL 196 11/21/2020   TRIG 223.0 (H) 11/21/2020   HDL 31.00 (L) 11/21/2020   LDLDIRECT 119.0 11/21/2020   LDLCALC 173 (H) 04/19/2019   ALT 18 11/21/2020   AST 11 11/21/2020   NA 137  11/21/2020   K 4.2 11/21/2020   CL 101 11/21/2020   CREATININE 0.76 11/21/2020   BUN 15 11/21/2020   CO2 28 11/21/2020   TSH 2.43 04/19/2019   PSA 0.45 03/25/2018   HGBA1C 7.8 (H) 11/21/2020   MICROALBUR 2.8 (H) 01/05/2020    DG Foot Complete Left  Result Date: 04/29/2019 CLINICAL DATA:  55 year old male with a history of nonhealing wound EXAM: LEFT FOOT - COMPLETE 3+ VIEW COMPARISON:  04/15/2018, FINDINGS: No acute displaced fracture. Degenerative changes of the tibiotalar joint, subtalar joint, enthesopathic changes of Achilles insertion and plantar fascia insertion. Degenerative changes of the midfoot. Valgus deformity of the 2-4 metatarsophalangeal joints, with no dislocation. Degenerative changes of the interphalangeal joints. No erosive changes of the distal phalanx of the great toe. There is lucency associated with the dorsal aspect of the distal great toe with no radiopaque foreign body. IMPRESSION: No acute bony abnormality, with no erosive changes of the great toe. Soft tissue changes on the dorsum of the great toe compatible with given history Degenerative changes of the foot Electronically Signed   By: Corrie Mckusick D.O.   On: 04/29/2019 08:38    Assessment & Plan:   Problem List Items Addressed This Visit      Unprioritized   DM (diabetes  mellitus), type 2, uncontrolled ([place])    Improving,  But not at goal .  We discussed increasing ozempic dose to 2 mg weekly.  Continue Lisabeth Pick and mealtime insulin.  Needs to check post prandials after snacks,  Meals. Has CBG monitor and follow up with Dr. Darnelle Maffucci for insulin adjustment once readings can be obtained   Lab Results  Component Value Date   HGBA1C 7.8 (H) 11/21/2020         Relevant Medications   rosuvastatin (CRESTOR) 10 MG tablet   Other Relevant Orders   Comprehensive metabolic panel   Hemoglobin A1c   Hyperlipidemia with target LDL less than 70    Increasing crestor to daily dosing for goal LDL 70  Lab Results  Component Value Date   CHOL 196 11/21/2020   HDL 31.00 (L) 11/21/2020   LDLCALC 173 (H) 04/19/2019   LDLDIRECT 119.0 11/21/2020   TRIG 223.0 (H) 11/21/2020   CHOLHDL 6 11/21/2020         Relevant Medications   rosuvastatin (CRESTOR) 10 MG tablet   Other Relevant Orders   Lipid panel   Microalbuminuria due to type 2 diabetes mellitus ([place])    Managed with losartan 100 mg daily  Lab Results  Component Value Date   MICROALBUR 2.8 (H) 01/05/2020   MICROALBUR 2.6 (H) 12/03/2018           Relevant Medications   rosuvastatin (CRESTOR) 10 MG tablet   Other Relevant Orders   Microalbumin / creatinine urine ratio   Morbid obesity with BMI of 45.0-49.9, adult ([place])    He is unable to exercise due to lumbar spinal stenosis.  Today we are increasing his ozempic dose to decrease his appetite      Spinal stenosis of lumbar region with radiculopathy    He has had progression of lumbar disk disease by review of serial  films.  His pain is managed with tramadol and gabapentin , both at low doses.   Refill history confirmed via [place] Controlled Substance databas, accessed by me today..Weight loss advised.        Other Visit Diagnoses    Prostate cancer screening    -  Primary  Relevant Orders   PSA      I have discontinued [name] "Ken"'s Ozempic (1 MG/DOSE). I have also changed his rosuvastatin. Additionally, I am having him maintain his freestyle, Insulin Pen Needle, blood glucose meter kit and supplies, OneTouch Verio, Aspirin Low Dose, FreeStyle Libre 2 Sensor, tamsulosin, gabapentin, Tresiba FlexTouch, NovoLOG FlexPen, amLODipine, losartan, empagliflozin, furosemide, and traMADol.  Meds ordered this encounter  Medications  . DISCONTD: Semaglutide, 1 MG/DOSE, (OZEMPIC, 1 MG/DOSE,) 2 MG/1.5ML SOPN    Sig: Inject 2 mg into the skin once a week.    Dispense:  3 mL    Refill:  2  . rosuvastatin (CRESTOR) 10 MG tablet    Sig: Take 1 tablet daily    Dispense:  90 tablet    Refill:  3    Medications Discontinued During This Encounter  Medication Reason  . Semaglutide, 1 MG/DOSE, (OZEMPIC, 1 MG/DOSE,) 2 MG/1.5ML SOPN   . rosuvastatin (CRESTOR) 10 MG tablet     Follow-up: No follow-ups on file.   [name], MD

## 2020-12-04 NOTE — Patient Instructions (Addendum)
[employer] Choice low carb power bowl entrees  are great low carb entrees that microwave in 5 minutes;  these are better tasting than Optavia   I will talk to Catie about increasing the Ozempic to the 2 mg dose to be < 70  Please increase the crestor (rosuvastatin ) to 10 mg daily  .  We want your LDL   Your PSA will be done in august as your prostate cancer screening  Your cologuard will be ordered at your next visit for your colon cancer screening   Please return for blood work in mid August (after August 10)

## 2020-12-05 ENCOUNTER — Encounter: Payer: Self-pay | Admitting: Internal Medicine

## 2020-12-05 ENCOUNTER — Ambulatory Visit: Payer: Medicare HMO | Admitting: Pharmacist

## 2020-12-05 DIAGNOSIS — E785 Hyperlipidemia, unspecified: Secondary | ICD-10-CM

## 2020-12-05 DIAGNOSIS — I152 Hypertension secondary to endocrine disorders: Secondary | ICD-10-CM

## 2020-12-05 DIAGNOSIS — E1165 Type 2 diabetes mellitus with hyperglycemia: Secondary | ICD-10-CM

## 2020-12-05 MED ORDER — OZEMPIC (2 MG/DOSE) 8 MG/3ML ~~LOC~~ SOPN: 2.0000 mg | PEN_INJECTOR | SUBCUTANEOUS | 2 refills | Status: DC

## 2020-12-05 NOTE — Assessment & Plan Note (Signed)
He is unable to exercise due to lumbar spinal stenosis.  Today we are increasing his ozempic dose to decrease his appetite

## 2020-12-05 NOTE — Chronic Care Management (AMB) (Signed)
Chronic Care Management Pharmacy Note  12/05/2020 Name:  Connor Ross MRN:  [id number] DOB:  June 18, 1966  Subjective: Connor Ross is an 55 y.o. year old male who is a primary patient of [name], [name], MD.  The CCM team was consulted for assistance with disease management and care coordination needs.    Care coordination for medication access in response to provider referral for pharmacy case management and/or care coordination services.   Consent to Services:  The patient was given information about Chronic Care Management services, agreed to services, and gave verbal consent prior to initiation of services.  Please see initial visit note for detailed documentation.   Patient Care Team: [name], MD as PCP - General (Internal Medicine) De Hollingshead, RPH-CPP as Pharmacist (Pharmacist)  Recent office visits: PCP visit yesterday, 5/23 - A1c 7.8%, increase Ozempic to 2 mg weekly. LDL 119, increase rosuvastatin to daily administration  Recent consult visits: None recently  Hospital visits: None in previous 6 months  Objective:  Lab Results  Component Value Date   CREATININE 0.76 11/21/2020   CREATININE 0.75 08/22/2020   CREATININE 0.75 05/04/2020    Lab Results  Component Value Date   HGBA1C 7.8 (H) 11/21/2020   Last diabetic Eye exam:  Lab Results  Component Value Date/Time   HMDIABEYEEXA No Retinopathy 05/10/2020 12:00 AM    Last diabetic Foot exam:  Lab Results  Component Value Date/Time   HMDIABFOOTEX normal 02/08/2015 12:00 AM        Component Value Date/Time   CHOL 196 11/21/2020 0834   TRIG 223.0 (H) 11/21/2020 0834   HDL 31.00 (L) 11/21/2020 0834   CHOLHDL 6 11/21/2020 0834   VLDL 44.6 (H) 11/21/2020 0834   LDLCALC 173 (H) 04/19/2019 0841   LDLDIRECT 119.0 11/21/2020 0834    Hepatic Function Latest Ref Rng & Units 11/21/2020 08/22/2020 05/04/2020  Total Protein 6.0 - 8.3 g/dL 6.9 7.0 6.6  Albumin 3.5 - 5.2 g/dL 4.3  4.1 4.3  AST 0 - 37 U/L _0 ALT 0 - 53 U/L _1 Alk Phosphatase 39 - 117 U/L 73 65 72  Total Bilirubin 0.2 - 1.2 mg/dL 0.4 0.5 0.4    Lab Results  Component Value Date/Time   TSH 2.43 04/19/2019 08:41 AM   TSH 3.28 05/10/2015 10:44 AM    CBC Latest Ref Rng & Units 04/19/2019 02/06/2015 04/03/2012  WBC 4.0 - 10.5 K/uL 7.8 7.2 8.7  Hemoglobin 13.0 - 17.0 g/dL 14.9 14.6 15.6  Hematocrit 39.0 - 52.0 % 44.9 43.6 45.5  Platelets 150.0 - 400.0 K/uL 259.0 241.0 228   Clinical ASCVD: No  The 10-year ASCVD risk score Mikey Bussing DC Jr., et al., 2013) is: 20.1%   Values used to calculate the score:     Age: 36 years     Sex: Male     Is Non-Hispanic African American: No     Diabetic: Yes     Tobacco smoker: No     Systolic Blood Pressure: 482 mmHg     Is BP treated: Yes     HDL Cholesterol: 31 mg/dL     Total Cholesterol: 196 mg/dL     Social History   Tobacco Use  Smoking Status Never Smoker  Smokeless Tobacco Never Used   BP Readings from Last 3 Encounters:  12/04/20 132/66  08/25/20 136/76  06/12/20 122/64   Pulse Readings from Last 3 Encounters:  12/04/20 92  08/25/20 91  05/08/20 78   Wt Readings from Last 3 Encounters:  12/04/20 (!) 319 lb 3.2 oz (144.8 kg)  08/25/20 (!) 314 lb 3.2 oz (142.5 kg)  05/08/20 (!) 319 lb 3.2 oz (144.8 kg)    Assessment: Review of patient past medical history, allergies, medications, health status, including review of consultants reports, laboratory and other test data, was performed as part of comprehensive evaluation and provision of chronic care management services.   SDOH:  (Social Determinants of Health) assessments and interventions performed:    CCM Care Plan  Allergies  Allergen Reactions  . Hctz [Hydrochlorothiazide] Other (See Comments)    Extreme Cramping  . Metformin And Related Diarrhea  . Statins     Medications Reviewed Today    Reviewed by Adair Laundry, [place] (Certified Medical Assistant) on 12/04/20 at  10  Med List Status: <None>  Medication Order Taking? Sig Documenting Provider Last Dose Status Informant  amLODipine (NORVASC) 2.5 MG tablet [id number] Yes Take 1 tablet (2.5 mg total) by mouth daily. [name], MD Taking Active   ASPIRIN LOW DOSE 81 MG EC tablet [id number] Yes TAKE 1 TABLET BY MOUTH  DAILY (SWALLOW WHOLE) [name], MD Taking Active   blood glucose meter kit and supplies [id number] Yes Dispense based on patient and insurance preference. Use up to four times daily as directed. (FOR ICD-10 E10.9, E11.9). Use to check blood sugar three times daily [name], MD Taking Active   Continuous Blood Gluc Sensor (FREESTYLE LIBRE 2 SENSOR) [place] [id number] Yes Use to check sugar at least 4 times daily [name], MD Taking Active   empagliflozin (JARDIANCE) 25 MG TABS tablet [id number] Yes Take 1 tablet (25 mg total) by mouth daily. [name], MD Taking Active   furosemide (LASIX) 20 MG tablet [id number] Yes Take 1 tablet (20 mg total) by mouth every other day. [name], MD Taking Active   gabapentin (NEURONTIN) 100 MG capsule [id number] Yes Take 1 capsule (100 mg total) by mouth 3 (three) times daily. [name], MD Taking Active   insulin aspart (NOVOLOG FLEXPEN) 100 UNIT/ML FlexPen [id number] Yes Inject up to 25 units with breakfast, 25 units with lunch, and 30 units with supper [name], MD Taking Active            Med Note ([place] Oct 31, 2020  2:02 PM) Breakfast: 20-25, Lunch: 20-25; Supper: 30-35   insulin degludec (TRESIBA FLEXTOUCH) 200 UNIT/ML FlexTouch Pen [id number] Yes Inject 88 Units into the skin daily.  Patient taking differently: Inject 86 Units into the skin daily.   [name], MD Taking Active            Med Note ([place] Oct 31, 2020  2:00 PM) 86-88 units  Insulin Pen Needle 32G X 6 MM MISC [id number] Yes Use daily with insulin pen [name], MD Taking Active   Lancets (FREESTYLE)  lancets [id number] Yes 1 each by Other route 2 (two) times daily. DX 250.02 [name], MD Taking Active   losartan (COZAAR) 100 MG tablet [id number] Yes Take 1 tablet (100 mg total) by mouth daily. [name], MD Taking Active   [facility] VERIO test strip [id number] Yes USE TO CHECK BLOOD SUGAR 3  TIMES DAILY [name], MD Taking Active   rosuvastatin (CRESTOR) 10 MG tablet [id number] Yes Take 1 tablet every other day [name], MD Taking Active  Semaglutide, 1 MG/DOSE, (OZEMPIC, 1 MG/DOSE,) 2 MG/1.5ML SOPN [id number] Yes Inject 1 mg into the skin once a week. [name], MD Taking Active   tamsulosin [facility]) 0.4 MG CAPS capsule [id number] Yes Take 1 capsule (0.4 mg total) by mouth daily. [name], MD Taking Active   traMADol Veatrice [place]) 50 MG tablet [id number] Yes TAKE 1 TABLET EVERY 6 HOURS AS NEEDED [name], MD Taking Active           Patient Active Problem List   Diagnosis Date Noted  . Pain of left shoulder joint on movement 08/27/2020  . Fracture of one rib, left side, subsequent encounter for fracture with routine healing 08/27/2020  . Personal history of COVID-19 08/25/2020  . Lower extremity edema 12/06/2018  . Esophageal stricture 12/06/2018  . Osteoarthritis 03/28/2018  . Myalgia due to statin 02/08/2015  . Colon cancer screening 07/30/2014  . Hypertension 05/16/2013  . Encounter for preventive health examination 05/16/2013  . OSA on CPAP 05/14/2013  . Spinal stenosis of lumbar region with radiculopathy 12/13/2011  . Normal cardiac stress test   . Microalbuminuria due to type 2 diabetes mellitus ([place]) 08/18/2011  . Hyperlipidemia with target LDL less than 70 08/18/2011  . Morbid obesity with BMI of 45.0-49.9, adult ([place]) 08/18/2011  . DM (diabetes mellitus), type 2, uncontrolled ([place])     Immunization History  Administered Date(s) Administered  . Tdap 10/16/2011    Conditions to be addressed/monitored: HTN, HLD and DMII  Care Plan :  Medication Management  Updates made by De Hollingshead, RPH-CPP since 12/05/2020 12:00 AM    Problem: Diabetes, Hypertension, Hyperlipidemia     Long-Range Goal: Disease Progression Prevention   This Visit's Progress: On track  Recent Progress: On track  Priority: High  Note:   Current Barriers:  . Unable to independently afford treatment regimen . Unable to achieve control of diabetes, hyperlipidemia   Pharmacist Clinical Goal(s):  [name] Over the next 90 days, patient will verbalize ability to afford treatment regimen. . Over the next 90 days, patient will  achieve control of diabetes as evidenced by improvement in A1c through collaboration with [name] and provider  Interventions: . 1:1 collaboration with [name], MD regarding development and update of comprehensive plan of care as evidenced by provider attestation and co-signature . Inter-disciplinary care team collaboration (see longitudinal plan of care) . Comprehensive medication review performed; medication list updated in electronic medical record  Diabetes: . Uncontrolled; current treatment: Ozempic 1 mg weekly (Mondays), Tresiba U200 86-88 units daily, Novolog 30-35 units w/ breakfast, 20 units w/lunch and 30-35 unit w/dinner; Jardiance 25 mg daily o Hx intolerance to metformin . PCP increased Ozempic to 2 mg weekly. Script for [employer] patient assistance completed, faxed in.   Hypertension: . Controlled; current treatment: losartan 100 mg daily, amlodipine 2.5 mg daily, furosemide 20 mg every other daily . Previously recommended to continue current regimen at this time.  Hyperlipidemia: . Uncontrolled but improving; current treatment: rosuvastatin 10 mg daily - just increased by PCP yesterday . Antiplatelet regimen: aspirin 81 mg daily  . Continue current regimen at this time. Follow for tolerability.   Peripheral Neuropathy: . Appropriately controlled; current regimen: gabapentin 100 mg TID . Previously  recommended to continue current regimen.  Patient Goals/Self-Care Activities . Over the next 90 days, patient will:  - take medications as prescribed check blood glucose at least at least three times daily using CGM, document, and provide at future appointments collaborate with  provider on medication access solutions  Follow Up Plan: Telephone follow up appointment with care management team member scheduled for: ~ 10 weeks as previously scheduled     Medication Assistance:  Joni Reining, Novolog obtained through [employer] medication assistance program.  Enrollment ends 07/14/21  Patient's preferred pharmacy is:  [facility] [address] [place] [address] Phone: [phone] Fax: [phone]  [id number] [place], [place] [address] [place] [address] Phone: [phone] Fax: [phone]   Follow Up:  Patient agrees to Care Plan and Follow-up.  Plan: Telephone follow up appointment with care management team member scheduled for:  ~ 10 weeks as previously scheduled  [name], [name], [place], [place] Clinical Pharmacist [employer] [facility] [employer] [phone]

## 2020-12-05 NOTE — Assessment & Plan Note (Signed)
Managed with losartan 100 mg daily  Lab Results  Component Value Date   MICROALBUR 2.8 (H) 01/05/2020   MICROALBUR 2.6 (H) 12/03/2018

## 2020-12-05 NOTE — Assessment & Plan Note (Addendum)
Improving,  But not at goal .  We discussed increasing ozempic dose to 2 mg weekly.  Continue Lisabeth Pick and mealtime insulin.  Needs to check post prandials after snacks,  Meals. Has CBG monitor and follow up with Dr. Darnelle Maffucci for insulin adjustment once readings can be obtained   Lab Results  Component Value Date   HGBA1C 7.8 (H) 11/21/2020

## 2020-12-05 NOTE — Assessment & Plan Note (Signed)
Increasing crestor to daily dosing for goal LDL 70  Lab Results  Component Value Date   CHOL 196 11/21/2020   HDL 31.00 (L) 11/21/2020   LDLCALC 173 (H) 04/19/2019   LDLDIRECT 119.0 11/21/2020   TRIG 223.0 (H) 11/21/2020   CHOLHDL 6 11/21/2020

## 2020-12-05 NOTE — Assessment & Plan Note (Addendum)
He has had progression of lumbar disk disease by review of serial  films.  His pain is managed with tramadol and gabapentin , both at low doses.   Refill history confirmed via [place] Controlled Substance databas, accessed by me today..Weight loss advised.

## 2020-12-05 NOTE — Patient Instructions (Signed)
Visit Information  PATIENT GOALS: Goals Addressed              This Visit's Progress     Patient Stated   .  Medication Management (pt-stated)        Patient Goals/Self-Care Activities . Over the next 90 days, patient will:  - take medications as prescribed check blood glucose at least three times daily using CGM, document, and provide at future appointments collaborate with provider on medication access solutions        Patient verbalizes understanding of instructions provided today and agrees to view in [place].   Plan: Telephone follow up appointment with care management team member scheduled for:  ~ 10 weeks as previously scheduled  [name], [name], [place], [place] Clinical Pharmacist [employer] [facility] [employer] [phone]

## 2020-12-07 ENCOUNTER — Ambulatory Visit (INDEPENDENT_AMBULATORY_CARE_PROVIDER_SITE_OTHER): Payer: Medicare HMO | Admitting: Pharmacist

## 2020-12-07 DIAGNOSIS — E1165 Type 2 diabetes mellitus with hyperglycemia: Secondary | ICD-10-CM | POA: Diagnosis not present

## 2020-12-07 DIAGNOSIS — I152 Hypertension secondary to endocrine disorders: Secondary | ICD-10-CM

## 2020-12-07 DIAGNOSIS — E785 Hyperlipidemia, unspecified: Secondary | ICD-10-CM | POA: Diagnosis not present

## 2020-12-07 NOTE — Patient Instructions (Signed)
Visit Information  PATIENT GOALS: Goals Addressed              This Visit's Progress     Patient Stated   .  Medication Management (pt-stated)        Patient Goals/Self-Care Activities . Over the next 90 days, patient will:  - take medications as prescribed check blood glucose at least three times daily using CGM, document, and provide at future appointments collaborate with provider on medication access solutions       Patient verbalizes understanding of instructions provided today and agrees to view in [place].   Plan: Telephone follow up appointment with care management team member scheduled for:  ~ 10 weeks as previously scheduled  [name], [name], [place], [place] Clinical Pharmacist [employer] [facility] [employer] [phone]

## 2020-12-07 NOTE — Chronic Care Management (AMB) (Signed)
Chronic Care Management Pharmacy Note  12/07/2020 Name:  Connor Ross MRN:  [id number] DOB:  05/15/1966  Subjective: Connor Ross is an 55 y.o. year old male who is a primary patient of [name], [name], MD.  The CCM team was consulted for assistance with disease management and care coordination needs.    Care coordination for medication access in response to provider referral for pharmacy case management and/or care coordination services.   Consent to Services:  The patient was given information about Chronic Care Management services, agreed to services, and gave verbal consent prior to initiation of services.  Please see initial visit note for detailed documentation.   Patient Care Team: [name], MD as PCP - General (Internal Medicine) De Hollingshead, RPH-CPP as Pharmacist (Pharmacist)  Recent office visits: None since our last appointment  Recent consult visits: None since our last appointment  Hospital visits: None in previous 6 months  Objective:  Lab Results  Component Value Date   CREATININE 0.76 11/21/2020   CREATININE 0.75 08/22/2020   CREATININE 0.75 05/04/2020    Lab Results  Component Value Date   HGBA1C 7.8 (H) 11/21/2020   Last diabetic Eye exam:  Lab Results  Component Value Date/Time   HMDIABEYEEXA No Retinopathy 05/10/2020 12:00 AM    Last diabetic Foot exam:  Lab Results  Component Value Date/Time   HMDIABFOOTEX normal 02/08/2015 12:00 AM        Component Value Date/Time   CHOL 196 11/21/2020 0834   TRIG 223.0 (H) 11/21/2020 0834   HDL 31.00 (L) 11/21/2020 0834   CHOLHDL 6 11/21/2020 0834   VLDL 44.6 (H) 11/21/2020 0834   LDLCALC 173 (H) 04/19/2019 0841   LDLDIRECT 119.0 11/21/2020 0834    Hepatic Function Latest Ref Rng & Units 11/21/2020 08/22/2020 05/04/2020  Total Protein 6.0 - 8.3 g/dL 6.9 7.0 6.6  Albumin 3.5 - 5.2 g/dL 4.3 4.1 4.3  AST 0 - 37 U/L _0 ALT 0 - 53 U/L _1 Alk Phosphatase  39 - 117 U/L 73 65 72  Total Bilirubin 0.2 - 1.2 mg/dL 0.4 0.5 0.4    Lab Results  Component Value Date/Time   TSH 2.43 04/19/2019 08:41 AM   TSH 3.28 05/10/2015 10:44 AM    CBC Latest Ref Rng & Units 04/19/2019 02/06/2015 04/03/2012  WBC 4.0 - 10.5 K/uL 7.8 7.2 8.7  Hemoglobin 13.0 - 17.0 g/dL 14.9 14.6 15.6  Hematocrit 39.0 - 52.0 % 44.9 43.6 45.5  Platelets 150.0 - 400.0 K/uL 259.0 241.0 228    No results found for: VD25OH  Clinical ASCVD: No  The 10-year ASCVD risk score Mikey Bussing DC Jr., et al., 2013) is: 20.1%   Values used to calculate the score:     Age: 71 years     Sex: Male     Is Non-Hispanic African American: No     Diabetic: Yes     Tobacco smoker: No     Systolic Blood Pressure: 606 mmHg     Is BP treated: Yes     HDL Cholesterol: 31 mg/dL     Total Cholesterol: 196 mg/dL      Social History   Tobacco Use  Smoking Status Never Smoker  Smokeless Tobacco Never Used   BP Readings from Last 3 Encounters:  12/04/20 132/66  08/25/20 136/76  06/12/20 122/64   Pulse Readings from Last 3 Encounters:  12/04/20 92  08/25/20 91  05/08/20 78  Wt Readings from Last 3 Encounters:  12/04/20 (!) 319 lb 3.2 oz (144.8 kg)  08/25/20 (!) 314 lb 3.2 oz (142.5 kg)  05/08/20 (!) 319 lb 3.2 oz (144.8 kg)    Assessment: Review of patient past medical history, allergies, medications, health status, including review of consultants reports, laboratory and other test data, was performed as part of comprehensive evaluation and provision of chronic care management services.   SDOH:  (Social Determinants of Health) assessments and interventions performed:    CCM Care Plan  Allergies  Allergen Reactions  . Hctz [Hydrochlorothiazide] Other (See Comments)    Extreme Cramping  . Metformin And Related Diarrhea  . Statins     Medications Reviewed Today    Reviewed by De Hollingshead, RPH-CPP (Pharmacist) on 12/07/20 at 1211  Med List Status: <None>  Medication Order  Taking? Sig Documenting Provider Last Dose Status Informant  amLODipine (NORVASC) 2.5 MG tablet [id number] No Take 1 tablet (2.5 mg total) by mouth daily. [name], MD Taking Active   ASPIRIN LOW DOSE 81 MG EC tablet [id number] No TAKE 1 TABLET BY MOUTH  DAILY (SWALLOW WHOLE) [name], MD Taking Active   blood glucose meter kit and supplies [id number] No Dispense based on patient and insurance preference. Use up to four times daily as directed. (FOR ICD-10 E10.9, E11.9). Use to check blood sugar three times daily [name], MD Taking Active   Continuous Blood Gluc Sensor (FREESTYLE LIBRE 2 SENSOR) MISC [id number] No Use to check sugar at least 4 times daily [name], MD Taking Active   empagliflozin (JARDIANCE) 25 MG TABS tablet [id number] No Take 1 tablet (25 mg total) by mouth daily. [name], MD Taking Active   furosemide (LASIX) 20 MG tablet [id number] No Take 1 tablet (20 mg total) by mouth every other day. [name], MD Taking Active   gabapentin (NEURONTIN) 100 MG capsule [id number] No Take 1 capsule (100 mg total) by mouth 3 (three) times daily. [name], MD Taking Active   insulin aspart (NOVOLOG FLEXPEN) 100 UNIT/ML FlexPen [id number] No Inject up to 25 units with breakfast, 25 units with lunch, and 30 units with supper [name], MD Taking Active            Med Note ([place] Oct 31, 2020  2:02 PM) Breakfast: 20-25, Lunch: 20-25; Supper: 30-35   insulin degludec (TRESIBA FLEXTOUCH) 200 UNIT/ML FlexTouch Pen [id number] No Inject 88 Units into the skin daily.  Patient taking differently: Inject 86 Units into the skin daily.   [name], MD Taking Active            Med Note ([place] Oct 31, 2020  2:00 PM) 86-88 units  Insulin Pen Needle 32G X 6 MM MISC [id number] No Use daily with insulin pen [name], MD Taking Active   Lancets (FREESTYLE) lancets [id number] No 1 each by Other route 2 (two) times daily.  DX 250.02 [name], MD Taking Active   losartan (COZAAR) 100 MG tablet [id number] No Take 1 tablet (100 mg total) by mouth daily. [name], MD Taking Active   [facility] VERIO test strip [id number] No USE TO CHECK BLOOD SUGAR 3  TIMES DAILY [name], MD Taking Active   rosuvastatin (CRESTOR) 10 MG tablet [id number]  Take 1 tablet daily [name], MD  Active   Semaglutide, 2 MG/DOSE, (OZEMPIC, 2  MG/DOSE,) 8 MG/3ML SOPN [id number]  Inject 2 mg into the skin once a week. [name], MD  Active   tamsulosin [facility]) 0.4 MG CAPS capsule [id number] No Take 1 capsule (0.4 mg total) by mouth daily. [name], MD Taking Active   traMADol Veatrice [place]) 50 MG tablet [id number] No TAKE 1 TABLET EVERY 6 HOURS AS NEEDED [name], MD Taking Active           Patient Active Problem List   Diagnosis Date Noted  . Pain of left shoulder joint on movement 08/27/2020  . Personal history of COVID-19 08/25/2020  . Lower extremity edema 12/06/2018  . Esophageal stricture 12/06/2018  . Osteoarthritis 03/28/2018  . Myalgia due to statin 02/08/2015  . Colon cancer screening 07/30/2014  . Hypertension 05/16/2013  . Encounter for preventive health examination 05/16/2013  . OSA on CPAP 05/14/2013  . Spinal stenosis of lumbar region with radiculopathy 12/13/2011  . Normal cardiac stress test   . Microalbuminuria due to type 2 diabetes mellitus ([place]) 08/18/2011  . Hyperlipidemia with target LDL less than 70 08/18/2011  . Obesity, diabetes, and hypertension syndrome ([place]) 08/18/2011  . Morbid obesity with BMI of 45.0-49.9, adult ([place]) 08/18/2011  . DM (diabetes mellitus), type 2, uncontrolled ([place])     Immunization History  Administered Date(s) Administered  . Tdap 10/16/2011    Conditions to be addressed/monitored: HTN, HLD and DMII  Care Plan : Medication Management  Updates made by De Hollingshead, RPH-CPP since 12/07/2020 12:00 AM    Problem: Diabetes, Hypertension,  Hyperlipidemia     Long-Range Goal: Disease Progression Prevention   This Visit's Progress: On track  Recent Progress: On track  Priority: High  Note:   Current Barriers:  . Unable to independently afford treatment regimen . Unable to achieve control of diabetes, hyperlipidemia   Pharmacist Clinical Goal(s):  [name] Over the next 90 days, patient will verbalize ability to afford treatment regimen. . Over the next 90 days, patient will  achieve control of diabetes as evidenced by improvement in A1c through collaboration with [name] and provider  Interventions: . 1:1 collaboration with [name], MD regarding development and update of comprehensive plan of care as evidenced by provider attestation and co-signature . Inter-disciplinary care team collaboration (see longitudinal plan of care) . Comprehensive medication review performed; medication list updated in electronic medical record  Diabetes: . Uncontrolled; current treatment: Ozempic 2 mg  -just increased , Tresiba U200 86-88 units daily, Novolog 30-35 units w/ breakfast, 20 units w/lunch and 30-35 unit w/dinner; Jardiance 25 mg daily o Hx intolerance to metformin . Sesser to follow up on status of order for 2 mg. Patient ID. [id number]. Ozempic 2 mg dose will arrive at our office in 10-14 business days.  [name] Next order for Tresiba, Novolog, pen needles due to release by 7/12.   Hypertension: . Controlled; current treatment: losartan 100 mg daily, amlodipine 2.5 mg daily, furosemide 20 mg every other daily . Previously recommended to continue current regimen at this time.  Hyperlipidemia: . Uncontrolled but improving; current treatment: rosuvastatin 10 mg daily - just increased by PCP yesterday . Antiplatelet regimen: aspirin 81 mg daily  . Continue current regimen at this time. Follow for tolerability.   Peripheral Neuropathy: . Appropriately controlled; current regimen: gabapentin 100 mg TID . Previously  recommended to continue current regimen.  Patient Goals/Self-Care Activities . Over the next 90 days, patient will:  - take medications as prescribed check blood glucose  at least at least three times daily using CGM, document, and provide at future appointments collaborate with provider on medication access solutions  Follow Up Plan: Telephone follow up appointment with care management team member scheduled for: ~ 10 weeks as previously scheduled     Medication Assistance: Joni Reining, Novologobtained throughNovo Nordiskmedication assistance program. Enrollment ends 07/14/21   Patient's preferred pharmacy is:  [facility], [address] [place] [address] Phone: [phone] Fax: [phone]  [id number] [place], [place] - [address] [place] [address] Phone: [phone] Fax: [phone]    Follow Up:  Patient agrees to Care Plan and Follow-up.  Plan: Telephone follow up appointment with care management team member scheduled for:  ~ 10 weeks as previously scheduled  [name], [name], [place], [place] Clinical Pharmacist [employer] [facility] [employer] [phone]

## 2020-12-21 ENCOUNTER — Telehealth: Payer: Self-pay

## 2020-12-21 NOTE — Telephone Encounter (Signed)
Received patient assistance medication. Pt is aware and stated that he would pick it up tomorrow.   Ozempic: 4 boxes

## 2020-12-22 NOTE — Telephone Encounter (Signed)
Patient states that he still has old pens of Ozempic and they came with the instructions to inject 1mg  instead of 2mg . Connor Ross asks if he should complete the old pens of ozempic at 1mg  before switching to the new pens received today for 2mg . Connor Ross would like a call back with the information.

## 2020-12-22 NOTE — Telephone Encounter (Signed)
Patient has arrived to pick up medication. Patient was given 4 labeled boxes of Ozempic.

## 2020-12-22 NOTE — Telephone Encounter (Signed)
Called and spoke to Connor Ross. Connor Ross verbalized understanding and had no further questions.

## 2020-12-24 DIAGNOSIS — E1165 Type 2 diabetes mellitus with hyperglycemia: Secondary | ICD-10-CM | POA: Diagnosis not present

## 2021-01-03 ENCOUNTER — Ambulatory Visit (INDEPENDENT_AMBULATORY_CARE_PROVIDER_SITE_OTHER): Payer: Medicare HMO

## 2021-01-03 ENCOUNTER — Telehealth: Payer: Self-pay | Admitting: Pharmacist

## 2021-01-03 VITALS — Ht 70.0 in | Wt 319.0 lb

## 2021-01-03 DIAGNOSIS — Z0000 Encounter for general adult medical examination without abnormal findings: Secondary | ICD-10-CM

## 2021-01-03 NOTE — Patient Instructions (Addendum)
[name] , Thank you for taking time to come for your Medicare Wellness Visit. I appreciate your ongoing commitment to your health goals. Please review the following plan we discussed and let me know if I can assist you in the future.   These are the goals we discussed:  Goals       Patient Stated     Medication Management (pt-stated)      Patient Goals/Self-Care Activities Over the next 90 days, patient will:  - take medications as prescribed check blood glucose at least three times daily using CGM, document, and provide at future appointments collaborate with provider on medication access solutions       Other     Increase physical activity      Healthy diet  Stay hydrated Stay active         This is a list of the screening recommended for you and due dates:  Health Maintenance  Topic Date Due   HIV Screening  01/03/2022*   Cologuard (Stool DNA test)  04/13/2021   Complete foot exam   05/08/2021   Eye exam for diabetics  05/10/2021   Hemoglobin A1C  05/24/2021   Tetanus Vaccine  10/15/2021   Hepatitis C Screening: USPSTF Recommendation to screen - Ages 18-79 yo.  Completed   HPV Vaccine  Aged Out   Pneumococcal Vaccination  Discontinued   Flu Shot  Discontinued   Pneumococcal vaccine  Discontinued   COVID-19 Vaccine  Discontinued   Zoster (Shingles) Vaccine  Discontinued  *Topic was postponed. The date shown is not the original due date.    Advanced directives: not yet completed.  Conditions/risks identified: none new  Follow up in one year for your annual wellness visit.   Preventive Care 20 Years and Older, Male Preventive care refers to lifestyle choices and visits with your health care provider that can promote health and wellness. What does preventive care include? A yearly physical exam. This is also called an annual well check. Dental exams once or twice a year. Routine eye exams. Ask your health care provider how often you should have your  eyes checked. Personal lifestyle choices, including: Daily care of your teeth and gums. Regular physical activity. Eating a healthy diet. Avoiding tobacco and drug use. Limiting alcohol use. Practicing safe sex. Taking low doses of aspirin every day. Taking vitamin and mineral supplements as recommended by your health care provider. What happens during an annual well check? The services and screenings done by your health care provider during your annual well check will depend on your age, overall health, lifestyle risk factors, and family history of disease. Counseling  Your health care provider may ask you questions about your: Alcohol use. Tobacco use. Drug use. Emotional well-being. Home and relationship well-being. Sexual activity. Eating habits. History of falls. Memory and ability to understand (cognition). Work and work Statistician. Screening  You may have the following tests or measurements: Height, weight, and BMI. Blood pressure. Lipid and cholesterol levels. These may be checked every 5 years, or more frequently if you are over 8 years old. Skin check. Lung cancer screening. You may have this screening every year starting at age 87 if you have a 30-pack-year history of smoking and currently smoke or have quit within the past 15 years. Fecal occult blood test (FOBT) of the stool. You may have this test every year starting at age 53. Flexible sigmoidoscopy or colonoscopy. You may have a sigmoidoscopy every 5 years or a colonoscopy every  10 years starting at age 53. Prostate cancer screening. Recommendations will vary depending on your family history and other risks. Hepatitis C blood test. Hepatitis B blood test. Sexually transmitted disease (STD) testing. Diabetes screening. This is done by checking your blood sugar (glucose) after you have not eaten for a while (fasting). You may have this done every 1-3 years. Abdominal aortic aneurysm (AAA) screening. You may need  this if you are a current or former smoker. Osteoporosis. You may be screened starting at age 86 if you are at high risk. Talk with your health care provider about your test results, treatment options, and if necessary, the need for more tests. Vaccines  Your health care provider may recommend certain vaccines, such as: Influenza vaccine. This is recommended every year. Tetanus, diphtheria, and acellular pertussis (Tdap, Td) vaccine. You may need a Td booster every 10 years. Zoster vaccine. You may need this after age 30. Pneumococcal 13-valent conjugate (PCV13) vaccine. One dose is recommended after age 74. Pneumococcal polysaccharide (PPSV23) vaccine. One dose is recommended after age 49. Talk to your health care provider about which screenings and vaccines you need and how often you need them. This information is not intended to replace advice given to you by your health care provider. Make sure you discuss any questions you have with your health care provider. Document Released: 07/28/2015 Document Revised: 03/20/2016 Document Reviewed: 05/02/2015 Elsevier Interactive Patient Education  2017 [place] Prevention in the Home Falls can cause injuries. They can happen to people of all ages. There are many things you can do to make your home safe and to help prevent falls. What can I do on the outside of my home? Regularly fix the edges of walkways and driveways and fix any cracks. Remove anything that might make you trip as you walk through a door, such as a raised step or threshold. Trim any bushes or trees on the path to your home. Use bright outdoor lighting. Clear any walking paths of anything that might make someone trip, such as rocks or tools. Regularly check to see if handrails are loose or broken. Make sure that both sides of any steps have handrails. Any raised decks and porches should have guardrails on the edges. Have any leaves, snow, or ice cleared regularly. Use  sand or salt on walking paths during winter. Clean up any spills in your garage right away. This includes oil or grease spills. What can I do in the bathroom? Use night lights. Install grab bars by the toilet and in the tub and shower. Do not use towel bars as grab bars. Use non-skid mats or decals in the tub or shower. If you need to sit down in the shower, use a plastic, non-slip stool. Keep the floor dry. Clean up any water that spills on the floor as soon as it happens. Remove soap buildup in the tub or shower regularly. Attach bath mats securely with double-sided non-slip rug tape. Do not have throw rugs and other things on the floor that can make you trip. What can I do in the bedroom? Use night lights. Make sure that you have a light by your bed that is easy to reach. Do not use any sheets or blankets that are too big for your bed. They should not hang down onto the floor. Have a firm chair that has side arms. You can use this for support while you get dressed. Do not have throw rugs and other things on the  floor that can make you trip. What can I do in the kitchen? Clean up any spills right away. Avoid walking on wet floors. Keep items that you use a lot in easy-to-reach places. If you need to reach something above you, use a strong step stool that has a grab bar. Keep electrical cords out of the way. Do not use floor polish or wax that makes floors slippery. If you must use wax, use non-skid floor wax. Do not have throw rugs and other things on the floor that can make you trip. What can I do with my stairs? Do not leave any items on the stairs. Make sure that there are handrails on both sides of the stairs and use them. Fix handrails that are broken or loose. Make sure that handrails are as long as the stairways. Check any carpeting to make sure that it is firmly attached to the stairs. Fix any carpet that is loose or worn. Avoid having throw rugs at the top or bottom of the  stairs. If you do have throw rugs, attach them to the floor with carpet tape. Make sure that you have a light switch at the top of the stairs and the bottom of the stairs. If you do not have them, ask someone to add them for you. What else can I do to help prevent falls? Wear shoes that: Do not have high heels. Have rubber bottoms. Are comfortable and fit you well. Are closed at the toe. Do not wear sandals. If you use a stepladder: Make sure that it is fully opened. Do not climb a closed stepladder. Make sure that both sides of the stepladder are locked into place. Ask someone to hold it for you, if possible. Clearly mark and make sure that you can see: Any grab bars or handrails. First and last steps. Where the edge of each step is. Use tools that help you move around (mobility aids) if they are needed. These include: Canes. Walkers. Scooters. Crutches. Turn on the lights when you go into a dark area. Replace any light bulbs as soon as they burn out. Set up your furniture so you have a clear path. Avoid moving your furniture around. If any of your floors are uneven, fix them. If there are any pets around you, be aware of where they are. Review your medicines with your doctor. Some medicines can make you feel dizzy. This can increase your chance of falling. Ask your doctor what other things that you can do to help prevent falls. This information is not intended to replace advice given to you by your health care provider. Make sure you discuss any questions you have with your health care provider. Document Released: 04/27/2009 Document Revised: 12/07/2015 Document Reviewed: 08/05/2014 Elsevier Interactive Patient Education  2017 [place].  Opioid Pain Medicine Management Opioid pain medicines are strong medicines that are used to treat bad or very bad pain. When you take them for a short time, they can help you: Sleep better. Do better in physical therapy. Feel better during  the first few days after you get hurt. Recover from surgery. Only take these medicines if a doctor says that you can. You should only take them for a short time. This is because opioids can be hard to stop taking (they are addictive). The longer you take opioids, the harder it may be to stop taking them (opioid use disorder). What are the risks? Opioids can cause problems (side effects). Taking them for more than  3 days raises your chance of problems, such as: Trouble pooping (constipation). Feeling sick to your stomach (nausea). Vomiting. Feeling very sleepy. Confusion. Not being able to stop taking the medicine. Breathing problems. Taking opioids for a long time can make it hard for you to do daily tasks. It can also put you at risk for: Car accidents. Depression. Suicide. Heart attack. Taking too much of the medicine (overdose), which can sometimes lead to death. What is a pain treatment plan? A pain treatment plan is a plan made by you and your doctor. Work with your doctor to make a plan for treating your pain. To help you do this: Talk about the goals of your treatment, including: How much pain you might expect to have. How you will manage the pain. Talk about the risks and benefits of taking these medicines for your condition. Remember that a good treatment plan uses more than one approach and lowers the risks of side effects. Tell your doctor about the amount of medicines you take and about any drug or alcohol use. Get your pain medicine prescriptions from only one doctor. Pain can be managed with other treatments. Work with your doctor to find other ways to help your pain, such as: Physical therapy. Counseling. Eating healthy foods. Brain exercises. Massage. Meditation. Other pain medicines. Doing gentle exercises. Tapering your use of opioids If you have been taking opioids for more than a few weeks, you may need to slowly decrease (taper) how much you take until you  stop taking them. Doing this can lower your chance of having symptoms, such as: Pain and cramping in your belly (abdomen). Feeling sick to your stomach. Sweating. Feeling very sleepy. Feeling restless. Shaking you cannot control (tremors). Cravings for the medicine. Do not try to stop taking them by yourself. Work with your doctor to stop. Your doctorwill help you take less until you are not taking the medicine at all. Follow these instructions at home: Safety and storage  While you are taking opioids: Do not drive. Do not use machines or power tools. Do not sign important papers (legal documents). Do not drink alcohol. Do not take sleeping pills. Do not take care of children by yourself. Do not do activities where you need to climb or be in high places, like working on a ladder. Do not go into any water, such as a lake, river, ocean, swimming pool, or hot tub. Keep your opioids locked up or in a place where children cannot reach them. Do not share your pain medicine with anyone.  Getting rid of leftover pills Do not save any leftover pills. Get rid of leftover pills safely by: Taking them to a take-back program in your area. Bringing them to a pharmacy that has a container for throwing away pills (pill disposal). Flushing them down the toilet. Check the label or package insert of your medicine to see whether this is safe to do. Throwing them in the trash. Check the label or package insert of your medicine to see whether this is safe to do. If it is safe to throw them out: Take the pills out of their container. Put the pills into a container you can seal. Mix the pills with used coffee grounds, food scraps, dirt, or cat litter. Put this in the trash. Activity Return to your normal activities as told by your doctor. Ask your doctor what activities are safe for you. Avoid doing things that make your pain worse. Do exercises as told by your doctor.  General instructions You may  need to take these actions to prevent or treat trouble pooping: Drink enough fluid to keep your pee (urine) pale yellow. Take over-the-counter or prescription medicines. Eat foods that are high in fiber. These include beans, whole grains, and fresh fruits and vegetables. Limit foods that are high in fat and sugar. These include fried or sweet foods. Keep all follow-up visits as told by your doctor. This is important. Where to find support If you have been taking opioids for a long time, think about getting helpquitting from a local support group or counselor. Ask your doctor about this. Where to find more information Centers for Disease Control and Prevention (CDC): http://www.wolf.info/ [place] Food and Drug Administration (FDA): [place] Get help right away if: Seek medical care right away if you are taking opioids and you, or people close to you, notice any of the following: You have trouble breathing. Your breathing is slower or more shallow than normal. You have a very slow heartbeat. You feel very confused. You pass out (faint). You are very sleepy. Your speech is not normal. You feel sick to your stomach and vomit. You have cold skin. You have blue lips or fingernails. Your muscles are weak (limp) and your body seems floppy. The black centers of your eyes (pupils) are smaller than normal. If you think that you or someone else may have taken too much of an opioid medicine, get medical help right away. Call your local emergency services (911 in the [place] Do not drive yourself to the hospital. If you ever feel like you may hurt yourself or others, or have thoughts about taking your own life, get help right away. You can go to your nearest emergency department or call: Your local emergency services (911 in the [place] The hotline of the [facility] 609-760-4167 in the [place] A suicide crisis helpline, such as the [facility] at (206) 039-0357. This  is open 24 hours a day. Summary Opioid are strong medicines that are used to treat bad or very bad pain. A pain treatment plan is a plan made by you and your doctor. Work with your doctor to make a plan for treating your pain. Work with your doctor to find other ways to help your pain. If you think that you or someone else may have taken too much of an opioid, get help right away. This information is not intended to replace advice given to you by your health care provider. Make sure you discuss any questions you have with your healthcare provider. Document Revised: 06/23/2020 Document Reviewed: 07/31/2018 Elsevier Patient Education  [place].

## 2021-01-03 NOTE — Telephone Encounter (Signed)
Received fax from [facility] requesting new physician order for [facility] 2 sensors. Completed reorder request to [facility] via [facility]. Will follow for outcome

## 2021-01-03 NOTE — Progress Notes (Addendum)
Subjective:   Connor Ross is a 55 y.o. male who presents for Medicare Annual/Subsequent preventive examination.  Review of Systems    No ROS.  Medicare Wellness Virtual Visit.  Visual/audio telehealth visit, UTA vital signs.   See social history for additional risk factors.   Cardiac Risk Factors include: advanced age (>47mn, >>89women);male gender;hypertension;diabetes mellitus     Objective:    Today's Vitals   01/03/21 1408  Weight: (!) 319 lb (144.7 kg)  Height: 5' 10"  (1.778 m)   Body mass index is 45.77 kg/m.  Advanced Directives 01/03/2021 01/03/2020  Does Patient Have a Medical Advance Directive? No No  Would patient like information on creating a medical advance directive? No - Patient declined No - Guardian declined    Current Medications (verified) Outpatient Encounter Medications as of 01/03/2021  Medication Sig   amLODipine (NORVASC) 2.5 MG tablet Take 1 tablet (2.5 mg total) by mouth daily.   ASPIRIN LOW DOSE 81 MG EC tablet TAKE 1 TABLET BY MOUTH  DAILY (SWALLOW WHOLE)   blood glucose meter kit and supplies Dispense based on patient and insurance preference. Use up to four times daily as directed. (FOR ICD-10 E10.9, E11.9). Use to check blood sugar three times daily   Continuous Blood Gluc Sensor (FREESTYLE LIBRE 2 SENSOR) MISC Use to check sugar at least 4 times daily   empagliflozin (JARDIANCE) 25 MG TABS tablet Take 1 tablet (25 mg total) by mouth daily.   furosemide (LASIX) 20 MG tablet Take 1 tablet (20 mg total) by mouth every other day.   gabapentin (NEURONTIN) 100 MG capsule Take 1 capsule (100 mg total) by mouth 3 (three) times daily.   insulin aspart (NOVOLOG FLEXPEN) 100 UNIT/ML FlexPen Inject up to 25 units with breakfast, 25 units with lunch, and 30 units with supper   insulin degludec (TRESIBA FLEXTOUCH) 200 UNIT/ML FlexTouch Pen Inject 88 Units into the skin daily. (Patient taking differently: Inject 86 Units into the skin daily.)    Insulin Pen Needle 32G X 6 MM MISC Use daily with insulin pen   Lancets (FREESTYLE) lancets 1 each by Other route 2 (two) times daily. DX 250.02   losartan (COZAAR) 100 MG tablet Take 1 tablet (100 mg total) by mouth daily.   ONETOUCH VERIO test strip USE TO CHECK BLOOD SUGAR 3  TIMES DAILY   rosuvastatin (CRESTOR) 10 MG tablet Take 1 tablet daily   Semaglutide, 2 MG/DOSE, (OZEMPIC, 2 MG/DOSE,) 8 MG/3ML SOPN Inject 2 mg into the skin once a week.   tamsulosin (FLOMAX) 0.4 MG CAPS capsule Take 1 capsule (0.4 mg total) by mouth daily.   traMADol (ULTRAM) 50 MG tablet TAKE 1 TABLET EVERY 6 HOURS AS NEEDED   No facility-administered encounter medications on file as of 01/03/2021.    Allergies (verified) Hctz [hydrochlorothiazide], Metformin and related, and Statins   History: Past Medical History:  Diagnosis Date   Diabetes mellitus    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 type 2 diabetes mellitus, limited to breakdown of skin ([place] 04/23/2019   Fracture of one rib, left side, subsequent encounter for fracture with routine healing 08/27/2020   Hyperlipidemia    Hypertension    Normal cardiac stress test 2012   [place]   Past Surgical History:  Procedure Laterality Date   EXOSTECTOMY Right 06/2019   5th MT head  ([facility])   LUMBAR DISC SURGERY     Family History  Problem Relation Age of Onset  Coronary artery disease Mother 79   COPD Mother    Heart disease Mother    Hyperlipidemia Mother    Hypertension Mother    Cancer Father    Hyperlipidemia Father    Hypertension Brother    Cancer Brother        lymphoma   Diabetes Maternal Aunt    Diabetes Maternal Uncle    Diabetes Maternal Grandmother    Heart disease Maternal Grandmother    Social History   Socioeconomic History   Marital status: Married    Spouse name: Not on file   Number of children: Not on file   Years of education: Not on file   Highest education level: Not on file  Occupational History    Not on file  Tobacco Use   Smoking status: Never   Smokeless tobacco: Never  Substance and Sexual Activity   Alcohol use: No   Drug use: No   Sexual activity: Not on file  Other Topics Concern   Not on file  Social History Narrative   Not on file   Social Determinants of Health   Financial Resource Strain: Low Risk    Difficulty of Paying Living Expenses: Not very hard  Food Insecurity: No Food Insecurity   Worried About Running Out of Food in the Last Year: Never true   Ran Out of Food in the Last Year: Never true  Transportation Needs: No Transportation Needs   Lack of Transportation (Medical): No   Lack of Transportation (Non-Medical): No  Physical Activity: Not on file  Stress: No Stress Concern Present   Feeling of Stress : Not at all  Social Connections: Unknown   Frequency of Communication with Friends and Family: Not on file   Frequency of Social Gatherings with Friends and Family: Not on file   Attends Religious Services: Not on Electrical engineer or Organizations: Not on file   Attends Archivist Meetings: Not on file   Marital Status: Married    Tobacco Counseling Counseling given: Not Answered   Clinical Intake:  Pre-visit preparation completed: Yes        Diabetes: Yes (Followed by pcp)  How often do you need to have someone help you when you read instructions, pamphlets, or other written materials from your doctor or pharmacy?: 1 - Never  Diabetic- followed by PCP and Chronic Care Management.   Interpreter Needed?: No      Activities of Daily Living In your present state of health, do you have any difficulty performing the following activities: 01/03/2021  Hearing? N  Vision? N  Difficulty concentrating or making decisions? N  Walking or climbing stairs? Y  Comment Paces self. Cane in use as needed.  Dressing or bathing? N  Doing errands, shopping? N  Preparing Food and eating ? N  Using the Toilet? N  In the  past six months, have you accidently leaked urine? N  Do you have problems with loss of bowel control? N  Managing your Medications? N  Managing your Finances? N  Housekeeping or managing your Housekeeping? N  Some recent data might be hidden    Patient Care Team: [name], MD as PCP - General (Internal Medicine) De Hollingshead, RPH-CPP as Pharmacist (Pharmacist)  Indicate any recent Medical Services you may have received from other than [facility] providers in the past year (date may be approximate).     Assessment:   This is a routine wellness examination for  Chrissie Noa.  I connected with Harvie today by telephone and verified that I am speaking with the correct person using two identifiers. Location patient: home Location provider: work Persons participating in the virtual visit: patient, Marine scientist.    I discussed the limitations, risks, security and privacy concerns of performing an evaluation and management service by telephone and the availability of in person appointments. The patient expressed understanding and verbally consented to this telephonic visit.    Interactive audio and video telecommunications were attempted between this provider and patient, however failed, due to patient having technical difficulties OR patient did not have access to video capability.  We continued and completed visit with audio only.  Some vital signs may be absent or patient reported.   Hearing/Vision screen Hearing Screening - Comments:: Patient has difficulty hearing conversational tones in a crowd. Audiology deferred in the last year per patient preference.    Vision Screening - Comments:: Followed by  Wears corrective lenses Cataract extraction, bilateral Visual acuity not assessed, virtual visit.  They have seen their ophthalmologist in the last 12 months.    Dietary issues and exercise activities discussed: Current Exercise Habits: Home exercise routine  Healthy diet Good water  intake   Goals Addressed             This Visit's Progress    Increase physical activity       Healthy diet  Stay hydrated Stay active        Depression Screen PHQ 2/9 Scores 01/03/2021 08/25/2020 01/03/2020 04/23/2019 03/12/2017 07/27/2014  PHQ - 2 Score 0 0 0 1 0 0  PHQ- 9 Score - - - 3 1 -    Fall Risk Fall Risk  01/03/2021 12/04/2020 08/25/2020 01/05/2020 01/03/2020  Falls in the past year? 1 1 1 1 1   Number falls in past yr: - 1 1 1  0  Comment - - - - Slipped on the wet grass about 2 months ago.  Injury with Fall? - 1 1 0 0  Risk for fall due to : - History of fall(s) - - -  Follow up Falls evaluation completed Falls evaluation completed Falls evaluation completed Falls evaluation completed Falls evaluation completed    [place]: Handrails in use when climbing stairs? Yes Home free of loose throw rugs in walkways, pet beds, electrical cords, etc? Yes  Adequate lighting in your home to reduce risk of falls? Yes   ASSISTIVE DEVICES UTILIZED TO PREVENT FALLS:  Life alert? No  Use of a cane, walker or w/c? No   TIMED UP AND GO: Was the test performed? No .   Cognitive Function:  Patient is alert and oriented x3.   6CIT Screen 01/03/2021 01/03/2020  What Year? 0 points 0 points  What month? 0 points 0 points  Months in reverse 0 points 0 points  Repeat phrase 0 points 0 points    Immunizations Immunization History  Administered Date(s) Administered   Tdap 10/16/2011    Health Maintenance Health Maintenance  Topic Date Due   HIV Screening  01/03/2022 (Originally [birth year]1979)   Fecal DNA (Cologuard)  04/13/2021   FOOT EXAM  05/08/2021   OPHTHALMOLOGY EXAM  05/10/2021   HEMOGLOBIN A1C  05/24/2021   TETANUS/TDAP  10/15/2021   Hepatitis C Screening  Completed   HPV VACCINES  Aged Out   Pneumococcal Vaccine 45-47 Years old  Discontinued   INFLUENZA VACCINE  Discontinued   PNEUMOCOCCAL POLYSACCHARIDE VACCINE AGE 24-64 HIGH RISK  Discontinued   COVID-19 Vaccine  Discontinued   Zoster Vaccines- Shingrix  Discontinued   Cologuard- due 04/13/21. Deferred ordering today per patient.   HIV screening- deferred. Labs followed by pcp.   Lung Cancer Screening: (Low Dose CT Chest recommended if Age 22-80 years, 30 pack-year currently smoking OR have quit w/in 15years.) does not qualify.   Vision Screening: Recommended annual ophthalmology exams for early detection of glaucoma and other disorders of the eye.  Dental Screening: Recommended annual dental exams for proper oral hygiene  Community Resource Referral / Chronic Care Management: CRR required this visit?  No   CCM required this visit?  No      Plan:   Keep all routine maintenance appointments.   I have personally reviewed and noted the following in the patient's chart:   Medical and social history Use of alcohol, tobacco or illicit drugs  Current medications and supplements including opioid prescriptions. Patient is currently taking Tramadol. Care plan and  followed by pcp.  Functional ability and status Nutritional status Physical activity Advanced directives List of other physicians Hospitalizations, surgeries, and ER visits in previous 12 months Vitals Screenings to include cognitive, depression, and falls Referrals and appointments  In addition, I have reviewed and discussed with patient certain preventive protocols, quality metrics, and best practice recommendations. A written personalized care plan for preventive services as well as general preventive health recommendations were provided to patient.     OBrien-Blaney, Amber Guthridge L, LPN   9/76/7341      I have reviewed the above information and agree with above.   Deborra Medina, MD

## 2021-01-04 NOTE — Telephone Encounter (Signed)
Per [place], order was delivered 12/24/20. Confirmed with USPS tracking # [phone].

## 2021-01-09 ENCOUNTER — Ambulatory Visit (INDEPENDENT_AMBULATORY_CARE_PROVIDER_SITE_OTHER): Payer: Medicare HMO | Admitting: Pharmacist

## 2021-01-09 DIAGNOSIS — E1169 Type 2 diabetes mellitus with other specified complication: Secondary | ICD-10-CM | POA: Diagnosis not present

## 2021-01-09 DIAGNOSIS — E785 Hyperlipidemia, unspecified: Secondary | ICD-10-CM | POA: Diagnosis not present

## 2021-01-09 DIAGNOSIS — E1165 Type 2 diabetes mellitus with hyperglycemia: Secondary | ICD-10-CM | POA: Diagnosis not present

## 2021-01-09 DIAGNOSIS — E1159 Type 2 diabetes mellitus with other circulatory complications: Secondary | ICD-10-CM

## 2021-01-09 DIAGNOSIS — E669 Obesity, unspecified: Secondary | ICD-10-CM | POA: Diagnosis not present

## 2021-01-09 DIAGNOSIS — I152 Hypertension secondary to endocrine disorders: Secondary | ICD-10-CM

## 2021-01-09 NOTE — Chronic Care Management (AMB) (Signed)
Chronic Care Management Pharmacy Note  01/09/2021 Name:  Connor Ross MRN:  [id number] DOB:  September 06, 1965    Subjective: Connor Ross is an 55 y.o. year old male who is a primary patient of [name], [name], MD.  The CCM team was consulted for assistance with disease management and care coordination needs.    Engaged with patient by telephone for  response to medication question  in response to provider referral for pharmacy case management and/or care coordination services.   Consent to Services:  The patient was given information about Chronic Care Management services, agreed to services, and gave verbal consent prior to initiation of services.  Please see initial visit note for detailed documentation.   Patient Care Team: [name], MD as PCP - General (Internal Medicine) De Hollingshead, RPH-CPP as Pharmacist (Pharmacist)   Objective:  Lab Results  Component Value Date   CREATININE 0.76 11/21/2020   CREATININE 0.75 08/22/2020   CREATININE 0.75 05/04/2020    Lab Results  Component Value Date   HGBA1C 7.8 (H) 11/21/2020   Last diabetic Eye exam:  Lab Results  Component Value Date/Time   HMDIABEYEEXA No Retinopathy 05/10/2020 12:00 AM    Last diabetic Foot exam:  Lab Results  Component Value Date/Time   HMDIABFOOTEX normal 02/08/2015 12:00 AM        Component Value Date/Time   CHOL 196 11/21/2020 0834   TRIG 223.0 (H) 11/21/2020 0834   HDL 31.00 (L) 11/21/2020 0834   CHOLHDL 6 11/21/2020 0834   VLDL 44.6 (H) 11/21/2020 0834   LDLCALC 173 (H) 04/19/2019 0841   LDLDIRECT 119.0 11/21/2020 0834    Hepatic Function Latest Ref Rng & Units 11/21/2020 08/22/2020 05/04/2020  Total Protein 6.0 - 8.3 g/dL 6.9 7.0 6.6  Albumin 3.5 - 5.2 g/dL 4.3 4.1 4.3  AST 0 - 37 U/L _0 ALT 0 - 53 U/L _1 Alk Phosphatase 39 - 117 U/L 73 65 72  Total Bilirubin 0.2 - 1.2 mg/dL 0.4 0.5 0.4    Lab Results  Component Value Date/Time   TSH 2.43  04/19/2019 08:41 AM   TSH 3.28 05/10/2015 10:44 AM    CBC Latest Ref Rng & Units 04/19/2019 02/06/2015 04/03/2012  WBC 4.0 - 10.5 K/uL 7.8 7.2 8.7  Hemoglobin 13.0 - 17.0 g/dL 14.9 14.6 15.6  Hematocrit 39.0 - 52.0 % 44.9 43.6 45.5  Platelets 150.0 - 400.0 K/uL 259.0 241.0 228    No results found for: VD25OH  Clinical ASCVD: No  The ASCVD Risk score Mikey Bussing DC Jr., et al., 2013) failed to calculate for the following reasons:   The systolic blood pressure is missing     Social History   Tobacco Use  Smoking Status Never  Smokeless Tobacco Never   BP Readings from Last 3 Encounters:  12/04/20 132/66  08/25/20 136/76  06/12/20 122/64   Pulse Readings from Last 3 Encounters:  12/04/20 92  08/25/20 91  05/08/20 78   Wt Readings from Last 3 Encounters:  01/03/21 (!) 319 lb (144.7 kg)  12/04/20 (!) 319 lb 3.2 oz (144.8 kg)  08/25/20 (!) 314 lb 3.2 oz (142.5 kg)    Assessment: Review of patient past medical history, allergies, medications, health status, including review of consultants reports, laboratory and other test data, was performed as part of comprehensive evaluation and provision of chronic care management services.   SDOH:  (Social Determinants of Health) assessments and interventions performed:  SDOH Interventions    Flowsheet Row Most Recent Value  SDOH Interventions   Financial Strain Interventions Other (Comment)  [manufacturer assistance]       CCM Care Plan  Allergies  Allergen Reactions   Hctz [Hydrochlorothiazide] Other (See Comments)    Extreme Cramping   Metformin And Related Diarrhea   Statins     Medications Reviewed Today     Reviewed by Dia Crawford, LPN (Licensed Practical Nurse) on 01/03/21 at 1406  Med List Status: <None>   Medication Order Taking? Sig Documenting Provider Last Dose Status Informant  amLODipine (NORVASC) 2.5 MG tablet [id number] No Take 1 tablet (2.5 mg total) by mouth daily. [name], MD Taking Active    ASPIRIN LOW DOSE 81 MG EC tablet [id number] No TAKE 1 TABLET BY MOUTH  DAILY (SWALLOW WHOLE) [name], MD Taking Active   blood glucose meter kit and supplies [id number] No Dispense based on patient and insurance preference. Use up to four times daily as directed. (FOR ICD-10 E10.9, E11.9). Use to check blood sugar three times daily [name], MD Taking Active   Continuous Blood Gluc Sensor (FREESTYLE LIBRE 2 SENSOR) MISC [id number] No Use to check sugar at least 4 times daily [name], MD Taking Active   empagliflozin (JARDIANCE) 25 MG TABS tablet [id number] No Take 1 tablet (25 mg total) by mouth daily. [name], MD Taking Active   furosemide (LASIX) 20 MG tablet [id number] No Take 1 tablet (20 mg total) by mouth every other day. [name], MD Taking Active   gabapentin (NEURONTIN) 100 MG capsule [id number] No Take 1 capsule (100 mg total) by mouth 3 (three) times daily. [name], MD Taking Active   insulin aspart (NOVOLOG FLEXPEN) 100 UNIT/ML FlexPen [id number] No Inject up to 25 units with breakfast, 25 units with lunch, and 30 units with supper [name], MD Taking Active            Med Note ([place] Oct 31, 2020  2:02 PM) Breakfast: 20-25, Lunch: 20-25; Supper: 30-35   insulin degludec (TRESIBA FLEXTOUCH) 200 UNIT/ML FlexTouch Pen [id number] No Inject 88 Units into the skin daily.  Patient taking differently: Inject 86 Units into the skin daily.   [name], MD Taking Active            Med Note ([place] Oct 31, 2020  2:00 PM) 86-88 units  Insulin Pen Needle 32G X 6 MM MISC [id number] No Use daily with insulin pen [name], MD Taking Active   Lancets (FREESTYLE) lancets [id number] No 1 each by Other route 2 (two) times daily. DX 250.02 [name], MD Taking Active   losartan (COZAAR) 100 MG tablet [id number] No Take 1 tablet (100 mg total) by mouth daily. [name], MD Taking Active   [facility] VERIO  test strip [id number] No USE TO CHECK BLOOD SUGAR 3  TIMES DAILY [name], MD Taking Active   rosuvastatin (CRESTOR) 10 MG tablet [id number]  Take 1 tablet daily [name], MD  Active   Semaglutide, 2 MG/DOSE, (OZEMPIC, 2 MG/DOSE,) 8 MG/3ML SOPN [id number]  Inject 2 mg into the skin once a week. [name], MD  Active   tamsulosin [facility]) 0.4 MG CAPS capsule [id number] No Take 1 capsule (0.4 mg total) by mouth daily. [name], MD Taking Active   traMADol (ULTRAM) 50 MG tablet  [id number] No TAKE 1 TABLET EVERY 6 HOURS AS NEEDED [name], MD Taking Active             Patient Active Problem List   Diagnosis Date Noted   Pain of left shoulder joint on movement 08/27/2020   Personal history of COVID-19 08/25/2020   Lower extremity edema 12/06/2018   Esophageal stricture 12/06/2018   Osteoarthritis 03/28/2018   Myalgia due to statin 02/08/2015   Colon cancer screening 07/30/2014   Hypertension 05/16/2013   Encounter for preventive health examination 05/16/2013   OSA on CPAP 05/14/2013   Spinal stenosis of lumbar region with radiculopathy 12/13/2011   Normal cardiac stress test    Microalbuminuria due to type 2 diabetes mellitus ([place]) 08/18/2011   Hyperlipidemia with target LDL less than 70 08/18/2011   Obesity, diabetes, and hypertension syndrome ([place]) 08/18/2011   Morbid obesity with BMI of 45.0-49.9, adult ([place]) 08/18/2011   DM (diabetes mellitus), type 2, uncontrolled ([place])     Immunization History  Administered Date(s) Administered   Tdap 10/16/2011    Conditions to be addressed/monitored: HTN, HLD, and DMII  Care Plan : Medication Management  Updates made by De Hollingshead, RPH-CPP since 01/09/2021 12:00 AM     Problem: Diabetes, Hypertension, Hyperlipidemia      Long-Range Goal: Disease Progression Prevention   This Visit's Progress: On track  Recent Progress: On track  Priority: High  Note:   Current Barriers:  Unable to  independently afford treatment regimen Unable to achieve control of diabetes, hyperlipidemia   Pharmacist Clinical Goal(s):  Over the next 90 days, patient will verbalize ability to afford treatment regimen. Over the next 90 days, patient will  achieve control of diabetes as evidenced by improvement in A1c through collaboration with [name] and provider  Interventions: 1:1 collaboration with [name], MD regarding development and update of comprehensive plan of care as evidenced by provider attestation and co-signature Inter-disciplinary care team collaboration (see longitudinal plan of care) Comprehensive medication review performed; medication list updated in electronic medical record  Diabetes: Uncontrolled; current treatment: Ozempic 2 mg, Tresiba U200 86-88 units daily, Novolog 30-35 units w/ breakfast, 20 units w/lunch and 30-35 unit w/dinner; Jardiance 25 mg daily Hx intolerance to metformin Patient called today noting that he is in the Coverage Gap and won't be able to afford the $400 copay. He has about 80 tablets left. Reviewed income, he is still over income for SGLT2 assistance (either [place] or [place]). He notes that [place] recommended that we call [facility] for a Tier Exception. Called to request Tier Exception, unfortunately, they cannot process a Tier Exception for a Tier 3 medication (as there are no brand name medications covered at a lower Tier). As patient has supply to last until our next call in August, will continue current regimen. Will investigate his ability to pay for 1 fill for a 90 day supply vs supplying samples to last through the end of the year.   Hypertension: Controlled; current treatment: losartan 100 mg daily, amlodipine 2.5 mg daily, furosemide 20 mg every other daily Previously recommended to continue current regimen at this time.  Hyperlipidemia: Uncontrolled but improving; current treatment: rosuvastatin 10 mg daily - just increased by  PCP yesterday Antiplatelet regimen: aspirin 81 mg daily  Previously recommended to continue current regimen at this time. Follow for tolerability.   Peripheral Neuropathy: Appropriately controlled; current regimen: gabapentin 100 mg TID Previously recommended to continue current regimen.  Patient Goals/Self-Care Activities Over the next  90 days, patient will:  - take medications as prescribed check blood glucose at least at least three times daily using CGM, document, and provide at future appointments collaborate with provider on medication access solutions  Follow Up Plan: Telephone follow up appointment with care management team member scheduled for: ~ 6 weeks as previously scheduled     Medication Assistance: Joni Reining, Novolog obtained through [employer] medication assistance program.  Enrollment ends 07/14/21  Patient's preferred pharmacy is:  [facility] - [place], [place] [address] Phone: [phone] Fax: [phone]  [id number] [place], [place] - [address] AT [address] [place] [address] Phone: [phone] Fax: [phone]    Follow Up:  Patient agrees to Care Plan and Follow-up.  Plan: Telephone follow up appointment with care management team member scheduled for:  ~ 6 weeks as previously scheduled  [name], [name], [place], [place] Clinical Pharmacist [employer] [facility] [employer] [phone]

## 2021-01-09 NOTE — Patient Instructions (Signed)
Visit Information  PATIENT GOALS:  Goals Addressed               This Visit's Progress     Patient Stated     Medication Management (pt-stated)        Patient Goals/Self-Care Activities Over the next 90 days, patient will:  - take medications as prescribed check blood glucose at least three times daily using CGM, document, and provide at future appointments collaborate with provider on medication access solutions           Patient verbalizes understanding of instructions provided today and agrees to view in [place].   Plan: Telephone follow up appointment with care management team member scheduled for:  ~ 6 weeks as previously scheduled   [name], [name], [place], [place] Clinical Pharmacist [employer] at [employer] [phone]

## 2021-01-10 ENCOUNTER — Other Ambulatory Visit: Payer: Self-pay | Admitting: Internal Medicine

## 2021-01-24 DIAGNOSIS — E1165 Type 2 diabetes mellitus with hyperglycemia: Secondary | ICD-10-CM | POA: Diagnosis not present

## 2021-02-13 ENCOUNTER — Ambulatory Visit: Payer: Medicare HMO

## 2021-02-14 ENCOUNTER — Other Ambulatory Visit: Payer: Self-pay

## 2021-02-14 ENCOUNTER — Telehealth: Payer: Self-pay

## 2021-02-14 ENCOUNTER — Ambulatory Visit (INDEPENDENT_AMBULATORY_CARE_PROVIDER_SITE_OTHER): Payer: Medicare HMO | Admitting: Pharmacist

## 2021-02-14 DIAGNOSIS — I152 Hypertension secondary to endocrine disorders: Secondary | ICD-10-CM | POA: Diagnosis not present

## 2021-02-14 DIAGNOSIS — E1165 Type 2 diabetes mellitus with hyperglycemia: Secondary | ICD-10-CM

## 2021-02-14 DIAGNOSIS — T466X5A Adverse effect of antihyperlipidemic and antiarteriosclerotic drugs, initial encounter: Secondary | ICD-10-CM

## 2021-02-14 DIAGNOSIS — E1129 Type 2 diabetes mellitus with other diabetic kidney complication: Secondary | ICD-10-CM | POA: Diagnosis not present

## 2021-02-14 DIAGNOSIS — R809 Proteinuria, unspecified: Secondary | ICD-10-CM | POA: Diagnosis not present

## 2021-02-14 DIAGNOSIS — E785 Hyperlipidemia, unspecified: Secondary | ICD-10-CM | POA: Diagnosis not present

## 2021-02-14 DIAGNOSIS — M791 Myalgia, unspecified site: Secondary | ICD-10-CM

## 2021-02-14 NOTE — Telephone Encounter (Signed)
Received 6 boxes of tresida for patient to pick from PA. Also Pt has been notified and he statwed he will pick up today at his appointment with Pharmacist. Medication placed in sample fridge.

## 2021-02-14 NOTE — Chronic Care Management (AMB) (Signed)
Chronic Care Management Pharmacy Note  02/14/2021 Name:  Connor Ross MRN:  [id number] DOB:  1965/09/06  Subjective: Connor Ross is an 55 y.o. year old male who is a primary patient of [name], [name], MD.  The CCM team was consulted for assistance with disease management and care coordination needs.    Engaged with patient face to face for follow up visit in response to provider referral for pharmacy case management and/or care coordination services.   Consent to Services:  The patient was given information about Chronic Care Management services, agreed to services, and gave verbal consent prior to initiation of services.  Please see initial visit note for detailed documentation.   Patient Care Team: [name], MD as PCP - General (Internal Medicine) De Hollingshead, RPH-CPP as Pharmacist (Pharmacist)  Objective:  Lab Results  Component Value Date   CREATININE 0.76 11/21/2020   CREATININE 0.75 08/22/2020   CREATININE 0.75 05/04/2020    Lab Results  Component Value Date   HGBA1C 7.8 (H) 11/21/2020   Last diabetic Eye exam:  Lab Results  Component Value Date/Time   HMDIABEYEEXA No Retinopathy 05/10/2020 12:00 AM    Last diabetic Foot exam:  Lab Results  Component Value Date/Time   HMDIABFOOTEX normal 02/08/2015 12:00 AM        Component Value Date/Time   CHOL 196 11/21/2020 0834   TRIG 223.0 (H) 11/21/2020 0834   HDL 31.00 (L) 11/21/2020 0834   CHOLHDL 6 11/21/2020 0834   VLDL 44.6 (H) 11/21/2020 0834   LDLCALC 173 (H) 04/19/2019 0841   LDLDIRECT 119.0 11/21/2020 0834    Hepatic Function Latest Ref Rng & Units 11/21/2020 08/22/2020 05/04/2020  Total Protein 6.0 - 8.3 g/dL 6.9 7.0 6.6  Albumin 3.5 - 5.2 g/dL 4.3 4.1 4.3  AST 0 - 37 U/L 11 11 10   ALT 0 - 53 U/L 18 20 18   Alk Phosphatase 39 - 117 U/L 73 65 72  Total Bilirubin 0.2 - 1.2 mg/dL 0.4 0.5 0.4    Lab Results  Component Value Date/Time   TSH 2.43 04/19/2019 08:41 AM    TSH 3.28 05/10/2015 10:44 AM    CBC Latest Ref Rng & Units 04/19/2019 02/06/2015 04/03/2012  WBC 4.0 - 10.5 K/uL 7.8 7.2 8.7  Hemoglobin 13.0 - 17.0 g/dL 14.9 14.6 15.6  Hematocrit 39.0 - 52.0 % 44.9 43.6 45.5  Platelets 150.0 - 400.0 K/uL 259.0 241.0 228    No results found for: VD25OH  Clinical ASCVD: No  The ASCVD Risk score Mikey Bussing DC Jr., et al., 2013) failed to calculate for the following reasons:   The systolic blood pressure is missing     Social History   Tobacco Use  Smoking Status Never  Smokeless Tobacco Never   BP Readings from Last 3 Encounters:  12/04/20 132/66  08/25/20 136/76  06/12/20 122/64   Pulse Readings from Last 3 Encounters:  12/04/20 92  08/25/20 91  05/08/20 78   Wt Readings from Last 3 Encounters:  01/03/21 (!) 319 lb (144.7 kg)  12/04/20 (!) 319 lb 3.2 oz (144.8 kg)  08/25/20 (!) 314 lb 3.2 oz (142.5 kg)    Assessment: Review of patient past medical history, allergies, medications, health status, including review of consultants reports, laboratory and other test data, was performed as part of comprehensive evaluation and provision of chronic care management services.   SDOH:  (Social Determinants of Health) assessments and interventions performed:  SDOH Interventions  Flowsheet Row Most Recent Value  SDOH Interventions   Financial Strain Interventions Other (Comment)  [manufacturer assistance]       CCM Care Plan  Allergies  Allergen Reactions   Hctz [Hydrochlorothiazide] Other (See Comments)    Extreme Cramping   Metformin And Related Diarrhea   Statins     Medications Reviewed Today     Reviewed by De Hollingshead, RPH-CPP (Pharmacist) on 02/14/21 at 1503  Med List Status: <None>   Medication Order Taking? Sig Documenting Provider Last Dose Status Informant  amLODipine (NORVASC) 2.5 MG tablet [id number] Yes Take 1 tablet (2.5 mg total) by mouth daily. [name], MD Taking Active   ASPIRIN LOW DOSE 81 MG EC tablet  [id number] Yes TAKE 1 TABLET BY MOUTH  DAILY (SWALLOW WHOLE) [name], MD Taking Active   blood glucose meter kit and supplies [id number] Yes Dispense based on patient and insurance preference. Use up to four times daily as directed. (FOR ICD-10 E10.9, E11.9). Use to check blood sugar three times daily [name], MD Taking Active   Continuous Blood Gluc Sensor (FREESTYLE LIBRE 2 SENSOR) [place] [id number] Yes Use to check sugar at least 4 times daily [name], MD Taking Active   empagliflozin (JARDIANCE) 25 MG TABS tablet [id number] Yes Take 1 tablet (25 mg total) by mouth daily. [name], MD Taking Active   furosemide (LASIX) 20 MG tablet [id number] Yes Take 1 tablet (20 mg total) by mouth every other day. [name], MD Taking Active   gabapentin (NEURONTIN) 100 MG capsule [id number] Yes Take 1 capsule (100 mg total) by mouth 3 (three) times daily. [name], MD Taking Active   insulin aspart (NOVOLOG FLEXPEN) 100 UNIT/ML FlexPen [id number] Yes Inject up to 25 units with breakfast, 25 units with lunch, and 30 units with supper [name], MD Taking Active            Med Note ([place] Oct 31, 2020  2:02 PM) Breakfast: 20-25, Lunch: 20-25; Supper: 30-35   insulin degludec (TRESIBA FLEXTOUCH) 200 UNIT/ML FlexTouch Pen [id number] Yes Inject 88 Units into the skin daily.  Patient taking differently: Inject 86 Units into the skin daily.   [name], MD Taking Active            Med Note ([place] Oct 31, 2020  2:00 PM) 86-88 units  Insulin Pen Needle 32G X 6 MM MISC [id number] Yes Use daily with insulin pen [name], MD Taking Active   Lancets (FREESTYLE) lancets [id number] Yes 1 each by Other route 2 (two) times daily. DX 250.02 [name], MD Taking Active   losartan (COZAAR) 100 MG tablet [id number] Yes Take 1 tablet (100 mg total) by mouth daily. [name], MD Taking Active   [facility] VERIO test strip [id number]  USE  TO CHECK BLOOD SUGAR 3  TIMES DAILY [name], MD  Active   rosuvastatin (CRESTOR) 10 MG tablet [id number] Yes Take 1 tablet daily [name], MD Taking Active   Semaglutide, 2 MG/DOSE, (OZEMPIC, 2 MG/DOSE,) 8 MG/3ML SOPN [id number] Yes Inject 2 mg into the skin once a week. [name], MD Taking Active   tamsulosin (FLOMAX) 0.4 MG CAPS capsule [id number] Yes TAKE 1 CAPSULE EVERY DAY [name], MD Taking Active   traMADol (ULTRAM) 50 MG tablet [id number] Yes TAKE 1 TABLET EVERY 6 HOURS AS NEEDED [name],  [name], MD Taking Active             Patient Active Problem List   Diagnosis Date Noted   Pain of left shoulder joint on movement 08/27/2020   Personal history of COVID-19 08/25/2020   Lower extremity edema 12/06/2018   Esophageal stricture 12/06/2018   Osteoarthritis 03/28/2018   Myalgia due to statin 02/08/2015   Colon cancer screening 07/30/2014   Hypertension 05/16/2013   Encounter for preventive health examination 05/16/2013   OSA on CPAP 05/14/2013   Spinal stenosis of lumbar region with radiculopathy 12/13/2011   Normal cardiac stress test    Microalbuminuria due to type 2 diabetes mellitus ([place]) 08/18/2011   Hyperlipidemia with target LDL less than 70 08/18/2011   Obesity, diabetes, and hypertension syndrome ([place]) 08/18/2011   Morbid obesity with BMI of 45.0-49.9, adult ([place]) 08/18/2011   DM (diabetes mellitus), type 2, uncontrolled ([place])     Immunization History  Administered Date(s) Administered   Tdap 10/16/2011    Conditions to be addressed/monitored: HTN, HLD, and DMII  Care Plan : Medication Management  Updates made by De Hollingshead, RPH-CPP since 02/14/2021 12:00 AM     Problem: Diabetes, Hypertension, Hyperlipidemia      Long-Range Goal: Disease Progression Prevention   This Visit's Progress: On track  Recent Progress: On track  Priority: High  Note:   Current Barriers:  Unable to independently afford treatment  regimen Unable to achieve control of diabetes, hyperlipidemia   Pharmacist Clinical Goal(s):  Over the next 90 days, patient will verbalize ability to afford treatment regimen. Over the next 90 days, patient will  achieve control of diabetes as evidenced by improvement in A1c through collaboration with [name] and provider  Interventions: 1:1 collaboration with [name], MD regarding development and update of comprehensive plan of care as evidenced by provider attestation and co-signature Inter-disciplinary care team collaboration (see longitudinal plan of care) Comprehensive medication review performed; medication list updated in electronic medical record  Diabetes: Controlled per CGM; current treatment: Ozempic 2 mg, Tresiba U200 86-88 units daily, Novolog 30-35 units w/ breakfast, 20 units w/lunch and 30-35 unit w/dinner; Jardiance 25 mg daily Hx intolerance to metformin Current glucose readings: Continue current regimen at this time. Upcoming PCP visit.  Over income for [place] assistance as well as [place] assistance. Providing samples.   Hypertension: Controlled; current treatment: losartan 100 mg daily, amlodipine 2.5 mg daily, furosemide 20 mg every other daily Previously recommended to continue current regimen at this time.  Hyperlipidemia: Uncontrolled but improving; current treatment: rosuvastatin 10 mg daily Does report increased aches/pains. He is unsure if it is related to rosuvastatin, as he feels he was tolerating well for a while. Additionally, he notes he has pains/arthritis at baseline.  Antiplatelet regimen: aspirin 81 mg daily  Offered to reduce rosuvastatin to 5 mg daily. Patient declined at this time. Follow up with PCP as scheduled.   Peripheral Neuropathy: Uncontrolled; current regimen: gabapentin 100 mg TID Reports discussing increasing the dose with PCP at last visit. Agree with this recommendation. Discussed that it could be moderately increased  using 100 mg capsules to 200 mg TID, or even continue 100 mg QAM, midday, and just increase at night if he is concerned about sedation.  Continue current regimen at this time and discuss with PCP at upcoming visit.   Patient Goals/Self-Care Activities Over the next 90 days, patient will:  - take medications as prescribed check blood glucose at least at least three times daily  using CGM, document, and provide at future appointments collaborate with provider on medication access solutions  Follow Up Plan: Face to Face appointment with care management team member scheduled for:  ~ 12 weeks     Medication Assistance: Joni Reining, Novolog obtained through [employer] medication assistance program.  Enrollment ends 07/14/21    Patient's preferred pharmacy is:  [facility] ([facility]) - [place], [place] [address] Phone: [phone] Fax: [phone]  [id number] [place], [place] - [address] AT [address] [place] [address] Phone: [phone] Fax: [phone]    Follow Up:  Patient agrees to Care Plan and Follow-up.  Plan: Face to Face appointment with care management team member scheduled for: 5 weeks  [name], [name], [place], CPP Clinical Pharmacist [place] at [facility] [phone]   Medication Samples have been provided to the patient.  Drug name: Jardiance       Strength: 25 mg        Qty: 8 boxes  LOT: [id number]  Exp.Date: 11/2022

## 2021-02-14 NOTE — Patient Instructions (Signed)
It was great to see you today!  Let's continue the current regimen: Tresiba 86-88 units daily, Novolog 20-30 units three times daily with meals, Ozempic 2 mg weekly, and Jardiance 25 mg daily. Keep scanning your sugar as often as you are.   Please do let us know if you want to reduce the rosuvastatin dose to see if that helps with the aches. If we reduce it and there is no change in the aches, we know it's not related to the rosuvastatin, it's probably something else.   Call me with any questions or concerns!  [name], [name] [phone] Visit Information  PATIENT GOALS:  Goals Addressed               This Visit's Progress     Patient Stated     Medication Management (pt-stated)        Patient Goals/Self-Care Activities Over the next 90 days, patient will:  - take medications as prescribed check blood glucose at least three times daily using CGM, document, and provide at future appointments collaborate with provider on medication access solutions        Print copy of patient instructions, educational materials, and care plan provided in person.  Plan: Face to Face appointment with care management team member scheduled for: 12 weeks  [name], [name], [place], [place] Clinical Pharmacist [employer] at [facility] [phone]

## 2021-02-15 ENCOUNTER — Telehealth: Payer: Self-pay

## 2021-02-15 NOTE — Telephone Encounter (Signed)
I called patient to let him know that we received the 7 boxes of Novolog [place] & the two boxes of [place]. Patient will pick up today.

## 2021-02-15 NOTE — Telephone Encounter (Signed)
Provided patient with PAP Tresiba supply at [facility] appointment yesterday.

## 2021-02-15 NOTE — Telephone Encounter (Signed)
Patient has pick up [place] 2 boxes & 7 boxes of Novolog.

## 2021-02-24 DIAGNOSIS — E1165 Type 2 diabetes mellitus with hyperglycemia: Secondary | ICD-10-CM | POA: Diagnosis not present

## 2021-03-08 ENCOUNTER — Other Ambulatory Visit: Payer: Self-pay

## 2021-03-08 ENCOUNTER — Other Ambulatory Visit (INDEPENDENT_AMBULATORY_CARE_PROVIDER_SITE_OTHER): Payer: Medicare HMO

## 2021-03-08 ENCOUNTER — Ambulatory Visit: Payer: Medicare HMO | Admitting: Pharmacist

## 2021-03-08 ENCOUNTER — Other Ambulatory Visit: Payer: Medicare HMO

## 2021-03-08 DIAGNOSIS — Z125 Encounter for screening for malignant neoplasm of prostate: Secondary | ICD-10-CM

## 2021-03-08 DIAGNOSIS — R809 Proteinuria, unspecified: Secondary | ICD-10-CM | POA: Diagnosis not present

## 2021-03-08 DIAGNOSIS — E1129 Type 2 diabetes mellitus with other diabetic kidney complication: Secondary | ICD-10-CM | POA: Diagnosis not present

## 2021-03-08 DIAGNOSIS — E785 Hyperlipidemia, unspecified: Secondary | ICD-10-CM | POA: Diagnosis not present

## 2021-03-08 DIAGNOSIS — E1165 Type 2 diabetes mellitus with hyperglycemia: Secondary | ICD-10-CM | POA: Diagnosis not present

## 2021-03-08 DIAGNOSIS — I152 Hypertension secondary to endocrine disorders: Secondary | ICD-10-CM

## 2021-03-08 DIAGNOSIS — E1169 Type 2 diabetes mellitus with other specified complication: Secondary | ICD-10-CM

## 2021-03-08 DIAGNOSIS — E669 Obesity, unspecified: Secondary | ICD-10-CM

## 2021-03-08 DIAGNOSIS — E1159 Type 2 diabetes mellitus with other circulatory complications: Secondary | ICD-10-CM

## 2021-03-08 LAB — COMPREHENSIVE METABOLIC PANEL
ALT: 20 U/L (ref 0–53)
AST: 13 U/L (ref 0–37)
Albumin: 4.1 g/dL (ref 3.5–5.2)
Alkaline Phosphatase: 69 U/L (ref 39–117)
BUN: 9 mg/dL (ref 6–23)
CO2: 25 mEq/L (ref 19–32)
Calcium: 8.8 mg/dL (ref 8.4–10.5)
Chloride: 101 mEq/L (ref 96–112)
Creatinine, Ser: 0.69 mg/dL (ref 0.40–1.50)
GFR: 103.09 mL/min (ref >60.00)
Glucose, Bld: 143 mg/dL — ABNORMAL HIGH (ref 70–99)
Potassium: 4 mEq/L (ref 3.5–5.1)
Sodium: 138 mEq/L (ref 135–145)
Total Bilirubin: 0.5 mg/dL (ref 0.2–1.2)
Total Protein: 6.8 g/dL (ref 6.0–8.3)

## 2021-03-08 LAB — LIPID PANEL
Cholesterol: 132 mg/dL (ref 0–200)
HDL: 24.9 mg/dL — ABNORMAL LOW (ref >39.00)
LDL Cholesterol: 67 mg/dL (ref 0–99)
NonHDL: 107.47
Total CHOL/HDL Ratio: 5
Triglycerides: 200 mg/dL — ABNORMAL HIGH (ref 0.0–149.0)
VLDL: 40 mg/dL (ref 0.0–40.0)

## 2021-03-08 LAB — MICROALBUMIN / CREATININE URINE RATIO
Creatinine,U: 93.5 mg/dL
Microalb Creat Ratio: 7.9 mg/g (ref 0.0–30.0)
Microalb, Ur: 7.4 mg/dL — ABNORMAL HIGH (ref 0.0–1.9)

## 2021-03-08 LAB — HEMOGLOBIN A1C: Hgb A1c MFr Bld: 7.7 % — ABNORMAL HIGH (ref 4.6–6.5)

## 2021-03-08 LAB — PSA: PSA: 0.33 ng/mL (ref 0.10–4.00)

## 2021-03-08 NOTE — Chronic Care Management (AMB) (Signed)
Chronic Care Management Pharmacy Note  03/08/2021 Name:  Connor Ross MRN:  [id number] DOB:  [birth year]  Subjective: Connor Ross is an 55 y.o. year old male who is a primary patient of [name], [name], MD.  The CCM team was consulted for assistance with disease management and care coordination needs.    Engaged with patient by telephone for follow up visit in response to provider referral for pharmacy case management and/or care coordination services.   Consent to Services:  The patient was given information about Chronic Care Management services, agreed to services, and gave verbal consent prior to initiation of services.  Please see initial visit note for detailed documentation.   Patient Care Team: [name], MD as PCP - General (Internal Medicine) De Hollingshead, RPH-CPP as Pharmacist (Pharmacist)   Objective:  Lab Results  Component Value Date   CREATININE 0.69 03/08/2021   CREATININE 0.76 11/21/2020   CREATININE 0.75 08/22/2020    Lab Results  Component Value Date   HGBA1C 7.7 (H) 03/08/2021   Last diabetic Eye exam:  Lab Results  Component Value Date/Time   HMDIABEYEEXA No Retinopathy 05/10/2020 12:00 AM    Last diabetic Foot exam:  Lab Results  Component Value Date/Time   HMDIABFOOTEX normal 02/08/2015 12:00 AM        Component Value Date/Time   CHOL 132 03/08/2021 0759   TRIG 200.0 (H) 03/08/2021 0759   HDL 24.90 (L) 03/08/2021 0759   CHOLHDL 5 03/08/2021 0759   VLDL 40.0 03/08/2021 0759   LDLCALC 67 03/08/2021 0759   LDLDIRECT 119.0 11/21/2020 0834    Hepatic Function Latest Ref Rng & Units 03/08/2021 11/21/2020 08/22/2020  Total Protein 6.0 - 8.3 g/dL 6.8 6.9 7.0  Albumin 3.5 - 5.2 g/dL 4.1 4.3 4.1  AST 0 - 37 U/L _0 ALT 0 - 53 U/L _1 Alk Phosphatase 39 - 117 U/L 69 73 65  Total Bilirubin 0.2 - 1.2 mg/dL 0.5 0.4 0.5    Lab Results  Component Value Date/Time   TSH 2.43 04/19/2019 08:41 AM   TSH 3.28  05/10/2015 10:44 AM    CBC Latest Ref Rng & Units 04/19/2019 02/06/2015 04/03/2012  WBC 4.0 - 10.5 K/uL 7.8 7.2 8.7  Hemoglobin 13.0 - 17.0 g/dL 14.9 14.6 15.6  Hematocrit 39.0 - 52.0 % 44.9 43.6 45.5  Platelets 150.0 - 400.0 K/uL 259.0 241.0 228    No results found for: VD25OH  Clinical ASCVD: No  The ASCVD Risk score Mikey Bussing DC Jr., et al., 2013) failed to calculate for the following reasons:   The systolic blood pressure is missing     Social History   Tobacco Use  Smoking Status Never  Smokeless Tobacco Never   BP Readings from Last 3 Encounters:  12/04/20 132/66  08/25/20 136/76  06/12/20 122/64   Pulse Readings from Last 3 Encounters:  12/04/20 92  08/25/20 91  05/08/20 78   Wt Readings from Last 3 Encounters:  01/03/21 (!) 319 lb (144.7 kg)  12/04/20 (!) 319 lb 3.2 oz (144.8 kg)  08/25/20 (!) 314 lb 3.2 oz (142.5 kg)    Assessment: Review of patient past medical history, allergies, medications, health status, including review of consultants reports, laboratory and other test data, was performed as part of comprehensive evaluation and provision of chronic care management services.   SDOH:  (Social Determinants of Health) assessments and interventions performed:  SDOH Interventions    Flowsheet Row  Most Recent Value  SDOH Interventions   Financial Strain Interventions Other (Comment)  [manufacturer assistance]       CCM Care Plan  Allergies  Allergen Reactions   Hctz [Hydrochlorothiazide] Other (See Comments)    Extreme Cramping   Metformin And Related Diarrhea   Statins     Medications Reviewed Today     Reviewed by De Hollingshead, RPH-CPP (Pharmacist) on 02/14/21 at 1503  Med List Status: <None>   Medication Order Taking? Sig Documenting Provider Last Dose Status Informant  amLODipine (NORVASC) 2.5 MG tablet [id number] Yes Take 1 tablet (2.5 mg total) by mouth daily. [name], MD Taking Active   ASPIRIN LOW DOSE 81 MG EC tablet [id number]  Yes TAKE 1 TABLET BY MOUTH  DAILY (SWALLOW WHOLE) [name], MD Taking Active   blood glucose meter kit and supplies [id number] Yes Dispense based on patient and insurance preference. Use up to four times daily as directed. (FOR ICD-10 E10.9, E11.9). Use to check blood sugar three times daily [name], MD Taking Active   Continuous Blood Gluc Sensor (FREESTYLE LIBRE 2 SENSOR) [place] [id number] Yes Use to check sugar at least 4 times daily [name], MD Taking Active   empagliflozin (JARDIANCE) 25 MG TABS tablet [id number] Yes Take 1 tablet (25 mg total) by mouth daily. [name], MD Taking Active   furosemide (LASIX) 20 MG tablet [id number] Yes Take 1 tablet (20 mg total) by mouth every other day. [name], MD Taking Active   gabapentin (NEURONTIN) 100 MG capsule [id number] Yes Take 1 capsule (100 mg total) by mouth 3 (three) times daily. [name], MD Taking Active   insulin aspart (NOVOLOG FLEXPEN) 100 UNIT/ML FlexPen [id number] Yes Inject up to 25 units with breakfast, 25 units with lunch, and 30 units with supper [name], MD Taking Active            Med Note ([place] Oct 31, 2020  2:02 PM) Breakfast: 20-25, Lunch: 20-25; Supper: 30-35   insulin degludec (TRESIBA FLEXTOUCH) 200 UNIT/ML FlexTouch Pen [id number] Yes Inject 88 Units into the skin daily.  Patient taking differently: Inject 86 Units into the skin daily.   [name], MD Taking Active            Med Note ([place] Oct 31, 2020  2:00 PM) 86-88 units  Insulin Pen Needle 32G X 6 MM MISC [id number] Yes Use daily with insulin pen [name], MD Taking Active   Lancets (FREESTYLE) lancets [id number] Yes 1 each by Other route 2 (two) times daily. DX 250.02 [name], MD Taking Active   losartan (COZAAR) 100 MG tablet [id number] Yes Take 1 tablet (100 mg total) by mouth daily. [name], MD Taking Active   [facility] VERIO test strip [id number]  USE TO CHECK  BLOOD SUGAR 3  TIMES DAILY [name], MD  Active   rosuvastatin (CRESTOR) 10 MG tablet [id number] Yes Take 1 tablet daily [name], MD Taking Active   Semaglutide, 2 MG/DOSE, (OZEMPIC, 2 MG/DOSE,) 8 MG/3ML SOPN [id number] Yes Inject 2 mg into the skin once a week. [name], MD Taking Active   tamsulosin (FLOMAX) 0.4 MG CAPS capsule [id number] Yes TAKE 1 CAPSULE EVERY DAY [name], MD Taking Active   traMADol (ULTRAM) 50 MG tablet [id number] Yes TAKE 1 TABLET EVERY 6 HOURS AS NEEDED [name],  MD Taking Active             Patient Active Problem List   Diagnosis Date Noted   Pain of left shoulder joint on movement 08/27/2020   Personal history of COVID-19 08/25/2020   Lower extremity edema 12/06/2018   Esophageal stricture 12/06/2018   Osteoarthritis 03/28/2018   Myalgia due to statin 02/08/2015   Colon cancer screening 07/30/2014   Hypertension 05/16/2013   Encounter for preventive health examination 05/16/2013   OSA on CPAP 05/14/2013   Spinal stenosis of lumbar region with radiculopathy 12/13/2011   Normal cardiac stress test    Microalbuminuria due to type 2 diabetes mellitus ([place]) 08/18/2011   Hyperlipidemia with target LDL less than 70 08/18/2011   Obesity, diabetes, and hypertension syndrome ([place]) 08/18/2011   Morbid obesity with BMI of 45.0-49.9, adult ([place]) 08/18/2011   DM (diabetes mellitus), type 2, uncontrolled ([place])     Immunization History  Administered Date(s) Administered   Tdap 10/16/2011    Conditions to be addressed/monitored: HTN, HLD, and DMII  Care Plan : Medication Management  Updates made by De Hollingshead, RPH-CPP since 03/08/2021 12:00 AM     Problem: Diabetes, Hypertension, Hyperlipidemia      Long-Range Goal: Disease Progression Prevention   This Visit's Progress: On track  Recent Progress: On track  Priority: High  Note:   Current Barriers:  Unable to independently afford treatment regimen Unable to  achieve control of diabetes, hyperlipidemia   Pharmacist Clinical Goal(s):  Over the next 90 days, patient will verbalize ability to afford treatment regimen. Over the next 90 days, patient will  achieve control of diabetes as evidenced by improvement in A1c through collaboration with [name] and provider  Interventions: 1:1 collaboration with [name], MD regarding development and update of comprehensive plan of care as evidenced by provider attestation and co-signature Inter-disciplinary care team collaboration (see longitudinal plan of care) Comprehensive medication review performed; medication list updated in electronic medical record  Diabetes: Controlled per CGM; current treatment: Ozempic 2 mg, Tresiba U200 86-88 units daily, Novolog 30-35 units w/ breakfast, 20 units w/lunch and 30-35 unit w/dinner; Jardiance 25 mg daily Hx intolerance to metformin Last A1c higher than anticipated. Discussed w/ PCP, ordered frutosamine to more fully evaluate glycemic control.  Hypertension: Controlled; current treatment: losartan 100 mg daily, amlodipine 2.5 mg daily, furosemide 20 mg every other daily Previously recommended to continue current regimen at this time.  Hyperlipidemia: Controlled; current treatment: rosuvastatin 10 mg daily Previously reported increased aches/pains. He is unsure if it is related to rosuvastatin, as he feels he was tolerating well for a while. Additionally, he notes he has pains/arthritis at baseline.  Antiplatelet regimen: aspirin 81 mg daily  Previously offered to reduce rosuvastatin to 5 mg daily. Patient declined at this time. Follow up with PCP as scheduled.   Peripheral Neuropathy: Uncontrolled; current regimen: gabapentin 100 mg TID Reports discussing increasing the dose with PCP at last visit. Agree with this recommendation. Discussed that it could be moderately increased using 100 mg capsules to 200 mg TID, or even continue 100 mg QAM, midday, and just  increase at night if he is concerned about sedation.  Previously recommended to continue current regimen at this time and discuss with PCP at upcoming visit.   Patient Goals/Self-Care Activities Over the next 90 days, patient will:  - take medications as prescribed check blood glucose at least at least three times daily using CGM, document, and provide at future appointments collaborate with  provider on medication access solutions  Follow Up Plan: Face to Face appointment with care management team member scheduled for:  ~ 10 weeks as previously scheduled     Medication Assistance: Joni Reining, Novolog obtained through [employer] medication assistance program.  Enrollment ends 07/14/21    Patient's preferred pharmacy is:  [facility] ([facility]) - [place], [place] [address] Phone: [phone] Fax: [phone]  [facility] #[id number] [place], [place] - [address] AT [address] [place] [address] Phone: [phone] Fax: [phone]   Follow Up:  Patient agrees to Care Plan and Follow-up.  Plan: Face to face follow up appointment with care management team member scheduled for:  ~10 weeks   [name], [name], [place], [place] Clinical Pharmacist [employer] at [facility] [phone]

## 2021-03-08 NOTE — Patient Instructions (Signed)
Visit Information  PATIENT GOALS:  Goals Addressed               This Visit's Progress     Patient Stated     Medication Management (pt-stated)        Patient Goals/Self-Care Activities Over the next 90 days, patient will:  - take medications as prescribed check blood glucose at least three times daily using CGM, document, and provide at future appointments collaborate with provider on medication access solutions         Patient verbalizes understanding of instructions provided today and agrees to view in [place].   Plan: Face to face follow up appointment with care management team member scheduled for:  ~10 weeks   [name], [name], [place], [place] Clinical Pharmacist [employer] at [facility] [phone]

## 2021-03-09 ENCOUNTER — Other Ambulatory Visit: Payer: Medicare HMO

## 2021-03-12 ENCOUNTER — Encounter: Payer: Self-pay | Admitting: Internal Medicine

## 2021-03-12 ENCOUNTER — Other Ambulatory Visit: Payer: Self-pay

## 2021-03-12 ENCOUNTER — Ambulatory Visit (INDEPENDENT_AMBULATORY_CARE_PROVIDER_SITE_OTHER): Payer: Medicare HMO | Admitting: Internal Medicine

## 2021-03-12 DIAGNOSIS — E669 Obesity, unspecified: Secondary | ICD-10-CM

## 2021-03-12 DIAGNOSIS — E1159 Type 2 diabetes mellitus with other circulatory complications: Secondary | ICD-10-CM

## 2021-03-12 DIAGNOSIS — I152 Hypertension secondary to endocrine disorders: Secondary | ICD-10-CM | POA: Diagnosis not present

## 2021-03-12 DIAGNOSIS — E1169 Type 2 diabetes mellitus with other specified complication: Secondary | ICD-10-CM

## 2021-03-12 MED ORDER — SILDENAFIL CITRATE 50 MG PO TABS: 50.0000 mg | ORAL_TABLET | Freq: Every day | ORAL | 5 refills | Status: DC | PRN

## 2021-03-12 NOTE — Patient Instructions (Signed)
Good to see you!   I have not made any medication changes today because you have had too many low blood sugars and your A1c is acceptable   Generic viagra prescription  given (allows you 10 per month)

## 2021-03-12 NOTE — Progress Notes (Signed)
Subjective:  Patient ID: Connor Ross, male    DOB: 1965/08/25  Age: 55 y.o. MRN: [id number]  CC: The encounter diagnosis was Obesity, diabetes, and hypertension syndrome ([place]).  HPI Connor Ross presents for follow  up on type 2 DM with obesity, hypertension and hyperlipidemia  This visit occurred during the SARS-CoV-2 public health emergency.  Safety protocols were in place, including screening questions prior to the visit, additional usage of staff PPE, and extensive cleaning of exam room while observing appropriate contact time as indicated for disinfecting solutions.    Seen by Courtney Heys last week.   Feels generally well.  Just returned from [place] trip [place] the mountains.  Feels relaxed but tired form grandchildren.    T2DM:  current treatment: Ozempic 2 mg, Tresiba U200 86-88 units daily, Novolog 30-35 units w/ breakfast, 20 units w/lunch and 30-35 unit w/dinner; Jardiance 25 mg daily  Post prandials between 4 pm and 6 pm 115 to 178 by 5:45.   2 hrs after PBJ  midnight snack  181 , by morning 113.   Frequent hypoglycemic episodes  occurring at least daily  < 70 .  Does not feel bad,  the alarm usually goes off.   He notes that his readings are very high when he first changes his meter , usually over 200,  for the first 48 hours,  but then they normalize.   He also notes that higher radings when he he places it on high on his deltoid comparatively   Wants generic viagra  no contraindications  to use  Hypertension: patient checks blood pressure twice weekly at home.  Readings have been for the most part < 140/80 at rest . Patient is following a reduce salt diet most days and is taking medications as prescribed  .        Outpatient Medications Prior to Visit  Medication Sig Dispense Refill   amLODipine (NORVASC) 2.5 MG tablet Take 1 tablet (2.5 mg total) by mouth daily. 90 tablet 3   ASPIRIN LOW DOSE 81 MG EC tablet TAKE 1 TABLET BY MOUTH  DAILY  (SWALLOW WHOLE) 90 tablet 1   blood glucose meter kit and supplies Dispense based on patient and insurance preference. Use up to four times daily as directed. (FOR ICD-10 E10.9, E11.9). Use to check blood sugar three times daily 1 each 0   Continuous Blood Gluc Sensor (FREESTYLE LIBRE 2 SENSOR) MISC Use to check sugar at least 4 times daily 1 each 2   empagliflozin (JARDIANCE) 25 MG TABS tablet Take 1 tablet (25 mg total) by mouth daily. 90 tablet 3   furosemide (LASIX) 20 MG tablet Take 1 tablet (20 mg total) by mouth every other day. 45 tablet 3   gabapentin (NEURONTIN) 100 MG capsule Take 1 capsule (100 mg total) by mouth 3 (three) times daily. 270 capsule 3   insulin aspart (NOVOLOG FLEXPEN) 100 UNIT/ML FlexPen Inject up to 25 units with breakfast, 25 units with lunch, and 30 units with supper 72 mL 2   insulin degludec (TRESIBA FLEXTOUCH) 200 UNIT/ML FlexTouch Pen Inject 88 Units into the skin daily. (Patient taking differently: Inject 86 Units into the skin daily.) 48 mL 2   Insulin Pen Needle 32G X 6 MM MISC Use daily with insulin pen 50 each 0   Lancets (FREESTYLE) lancets 1 each by Other route 2 (two) times daily. DX 250.02 100 each 12   losartan (COZAAR) 100 MG tablet Take 1 tablet (100  mg total) by mouth daily. 90 tablet 1   ONETOUCH VERIO test strip USE TO CHECK BLOOD SUGAR 3  TIMES DAILY 300 strip 3   rosuvastatin (CRESTOR) 10 MG tablet Take 1 tablet daily 90 tablet 3   Semaglutide, 2 MG/DOSE, (OZEMPIC, 2 MG/DOSE,) 8 MG/3ML SOPN Inject 2 mg into the skin once a week. 8 mL 2   tamsulosin (FLOMAX) 0.4 MG CAPS capsule TAKE 1 CAPSULE EVERY DAY 90 capsule 3   traMADol (ULTRAM) 50 MG tablet TAKE 1 TABLET EVERY 6 HOURS AS NEEDED 90 tablet 1   No facility-administered medications prior to visit.    Review of Systems;  Patient denies headache, fevers, malaise, unintentional weight loss, skin rash, eye pain, sinus congestion and sinus pain, sore throat, dysphagia,  hemoptysis , cough,  dyspnea, wheezing, chest pain, palpitations, orthopnea, edema, abdominal pain, nausea, melena, diarrhea, constipation, flank pain, dysuria, hematuria, urinary  Frequency, nocturia, numbness, tingling, seizures,  Focal weakness, Loss of consciousness,  Tremor, insomnia, depression, anxiety, and suicidal ideation.      Objective:  BP 138/66 (BP Location: Right Arm, Patient Position: Sitting, Cuff Size: Large)   Pulse 83   Temp (!) 96.5 F (35.8 C) (Temporal)   Resp 16   Ht 5' 10"  (1.778 m)   Wt (!) 316 lb 3.2 oz (143.4 kg)   SpO2 93%   BMI 45.37 kg/m   BP Readings from Last 3 Encounters:  03/12/21 138/66  12/04/20 132/66  08/25/20 136/76    Wt Readings from Last 3 Encounters:  03/12/21 (!) 316 lb 3.2 oz (143.4 kg)  01/03/21 (!) 319 lb (144.7 kg)  12/04/20 (!) 319 lb 3.2 oz (144.8 kg)    General appearance: alert, cooperative and appears stated age Ears: normal TM's and external ear canals both ears Throat: lips, mucosa, and tongue normal; teeth and gums normal Neck: no adenopathy, no carotid bruit, supple, symmetrical, trachea midline and thyroid not enlarged, symmetric, no tenderness/mass/nodules Back: symmetric, no curvature. ROM normal. No CVA tenderness. Lungs: clear to auscultation bilaterally Heart: regular rate and rhythm, S1, S2 normal, no murmur, click, rub or gallop Abdomen: soft, non-tender; bowel sounds normal; no masses,  no organomegaly Pulses: 2+ and symmetric Skin: Skin color, texture, turgor normal except for mild venous stasis changes.  No rashes or lesions Lymph nodes: Cervical, supraclavicular, and axillary nodes normal.  Lab Results  Component Value Date   HGBA1C 7.7 (H) 03/08/2021   HGBA1C 7.8 (H) 11/21/2020   HGBA1C 8.1 (H) 08/22/2020    Lab Results  Component Value Date   CREATININE 0.69 03/08/2021   CREATININE 0.76 11/21/2020   CREATININE 0.75 08/22/2020    Lab Results  Component Value Date   WBC 7.8 04/19/2019   HGB 14.9 04/19/2019    HCT 44.9 04/19/2019   PLT 259.0 04/19/2019   GLUCOSE 143 (H) 03/08/2021   CHOL 132 03/08/2021   TRIG 200.0 (H) 03/08/2021   HDL 24.90 (L) 03/08/2021   LDLDIRECT 119.0 11/21/2020   LDLCALC 67 03/08/2021   ALT 20 03/08/2021   AST 13 03/08/2021   NA 138 03/08/2021   K 4.0 03/08/2021   CL 101 03/08/2021   CREATININE 0.69 03/08/2021   BUN 9 03/08/2021   CO2 25 03/08/2021   TSH 2.43 04/19/2019   PSA 0.33 03/08/2021   HGBA1C 7.7 (H) 03/08/2021   MICROALBUR 7.4 (H) 03/08/2021    DG Foot Complete Left  Result Date: 04/29/2019 CLINICAL DATA:  55 year old male with a history of nonhealing wound EXAM:  LEFT FOOT - COMPLETE 3+ VIEW COMPARISON:  04/15/2018, FINDINGS: No acute displaced fracture. Degenerative changes of the tibiotalar joint, subtalar joint, enthesopathic changes of Achilles insertion and plantar fascia insertion. Degenerative changes of the midfoot. Valgus deformity of the 2-4 metatarsophalangeal joints, with no dislocation. Degenerative changes of the interphalangeal joints. No erosive changes of the distal phalanx of the great toe. There is lucency associated with the dorsal aspect of the distal great toe with no radiopaque foreign body. IMPRESSION: No acute bony abnormality, with no erosive changes of the great toe. Soft tissue changes on the dorsum of the great toe compatible with given history Degenerative changes of the foot Electronically Signed   By: Corrie Mckusick D.O.   On: 04/29/2019 08:38    Assessment & Plan:   Problem List Items Addressed This Visit       Unprioritized   Obesity, diabetes, and hypertension syndrome ([place])    He is unable to exercise due to lumbar spinal stenosis.  He is on maximal drug therapy with basal and mealtime insulin,   Ozempic,  Jardiance,  ARB And statin therapy  Lab Results  Component Value Date   HGBA1C 7.7 (H) 03/08/2021   . Lab Results  Component Value Date   MICROALBUR 7.4 (H) 03/08/2021   MICROALBUR 2.8 (H) 01/05/2020            Relevant Medications   sildenafil (VIAGRA) 50 MG tablet   I spent 30 minutes dedicated to the care of this patient on the date of this encounter to include pre-visit review of his medical history,  Face-to-face time with the patient , and post visit ordering of testing and therapeutics.  Meds ordered this encounter  Medications   sildenafil (VIAGRA) 50 MG tablet    Sig: Take 1 tablet (50 mg total) by mouth daily as needed for erectile dysfunction.    Dispense:  10 tablet    Refill:  5    There are no discontinued medications.  Follow-up: Return in about 6 months (around 09/11/2021) for follow up diabetes.   [name], MD

## 2021-03-13 NOTE — Assessment & Plan Note (Addendum)
He is unable to exercise due to lumbar spinal stenosis.  He is on maximal drug therapy with basal and mealtime insulin,   Ozempic,  Jardiance,  ARB And statin therapy  Lab Results  Component Value Date   HGBA1C 7.7 (H) 03/08/2021   . Lab Results  Component Value Date   MICROALBUR 7.4 (H) 03/08/2021   MICROALBUR 2.8 (H) 01/05/2020

## 2021-03-14 LAB — FRUCTOSAMINE: Fructosamine: 292 umol/L — ABNORMAL HIGH (ref 205–285)

## 2021-03-14 NOTE — Assessment & Plan Note (Signed)
Corrected A1c using fructosamine is estimated at 6.6 .  No medication changes are needed at this time

## 2021-03-14 NOTE — Progress Notes (Signed)
Corrected A1c using fructosamine is estimated at 6.6 .  No medication changes are needed at this time

## 2021-03-21 ENCOUNTER — Other Ambulatory Visit: Payer: Self-pay | Admitting: Internal Medicine

## 2021-03-22 ENCOUNTER — Telehealth: Payer: Self-pay

## 2021-03-22 NOTE — Telephone Encounter (Signed)
Spoke with pt to let him know that we have received 5 boxes of pen needles from his patient assistance. Pt stated that he would get them as soon as possible.

## 2021-03-27 DIAGNOSIS — E1165 Type 2 diabetes mellitus with hyperglycemia: Secondary | ICD-10-CM | POA: Diagnosis not present

## 2021-04-04 DIAGNOSIS — Z1211 Encounter for screening for malignant neoplasm of colon: Secondary | ICD-10-CM

## 2021-04-18 ENCOUNTER — Telehealth: Payer: Self-pay

## 2021-04-18 NOTE — Telephone Encounter (Signed)
Spoke with pt to let him know that we have received his patient assistance medication and it is ready for pick up. Pt stated that he would be by tomorrow to pick up.   Ozempic: 4 boxes

## 2021-04-19 DIAGNOSIS — Z1211 Encounter for screening for malignant neoplasm of colon: Secondary | ICD-10-CM | POA: Diagnosis not present

## 2021-04-20 ENCOUNTER — Other Ambulatory Visit: Payer: Self-pay | Admitting: Internal Medicine

## 2021-04-20 DIAGNOSIS — M48061 Spinal stenosis, lumbar region without neurogenic claudication: Secondary | ICD-10-CM

## 2021-04-20 DIAGNOSIS — M5416 Radiculopathy, lumbar region: Secondary | ICD-10-CM

## 2021-04-20 MED ORDER — TRAMADOL HCL 50 MG PO TABS: 50.0000 mg | ORAL_TABLET | Freq: Two times a day (BID) | ORAL | 5 refills | Status: DC | PRN

## 2021-04-20 NOTE — Progress Notes (Signed)
Tramadol dose reduced to q 12 hrs prn #60   sent to mail order as 30 day supply as I will not send 90 day supply via mail order

## 2021-04-20 NOTE — Telephone Encounter (Signed)
RX Refill:tramadol  Last Seen:03-12-21 Last ordered:11-24-20

## 2021-04-20 NOTE — Telephone Encounter (Signed)
LMTCB

## 2021-04-20 NOTE — Telephone Encounter (Signed)
Patient stated he doesn't use it that nmuch and he has to have it sent to a mail order pharmacy. He stated Dr [name] has always sent it to a mail order pharmacy. Informed him that we can not send a 90 day supply to a mail order pharmacy due to it being a controlled substance. Patient stated does she know who this is and she has always filled at a mail order. He does not see why this all of a sudden is becoming an issue. He would like the medication to be sent to mail order. Per his insurance he has to go throught mail order pharmacy.

## 2021-04-20 NOTE — Telephone Encounter (Signed)
Pt called back and was informed that if his medication is sent through mail order and gets lost then he will not be able to refill until his next refill date due to medication being a controlled substance. Pt stated that he is okay with that and that he has never had any trouble with it before.

## 2021-04-26 LAB — COLOGUARD: Cologuard: NEGATIVE

## 2021-04-27 DIAGNOSIS — E1165 Type 2 diabetes mellitus with hyperglycemia: Secondary | ICD-10-CM | POA: Diagnosis not present

## 2021-05-06 ENCOUNTER — Other Ambulatory Visit: Payer: Self-pay | Admitting: Internal Medicine

## 2021-05-06 DIAGNOSIS — I152 Hypertension secondary to endocrine disorders: Secondary | ICD-10-CM

## 2021-05-16 ENCOUNTER — Ambulatory Visit (INDEPENDENT_AMBULATORY_CARE_PROVIDER_SITE_OTHER): Payer: Medicare HMO | Admitting: Pharmacist

## 2021-05-16 ENCOUNTER — Ambulatory Visit: Payer: Medicare HMO

## 2021-05-16 ENCOUNTER — Other Ambulatory Visit: Payer: Self-pay

## 2021-05-16 DIAGNOSIS — E785 Hyperlipidemia, unspecified: Secondary | ICD-10-CM

## 2021-05-16 DIAGNOSIS — E1129 Type 2 diabetes mellitus with other diabetic kidney complication: Secondary | ICD-10-CM

## 2021-05-16 DIAGNOSIS — R809 Proteinuria, unspecified: Secondary | ICD-10-CM

## 2021-05-16 DIAGNOSIS — E1165 Type 2 diabetes mellitus with hyperglycemia: Secondary | ICD-10-CM

## 2021-05-16 NOTE — Patient Instructions (Signed)
Keep up the fantastic work!  Bring me a copy of your wife's 2 most recent pay stubs as proof of income.   Continue your current regimen at this time.   I'll see you about 3 months after you see Dr. [name].   Schedule your yearly diabetic eye exam. Have the results faxed to our office.   [name], [name]  Visit Information  Print copy of patient instructions, educational materials, and care plan provided in person.   Plan: Face to Face appointment with care management team member scheduled for: 6 months   [name], [name], [place], [place] Clinical Pharmacist [employer] at [employer] [phone]

## 2021-05-16 NOTE — Chronic Care Management (AMB) (Signed)
Chronic Care Management CCM Pharmacy Note  05/16/2021 Name:  Connor Ross MRN:  [id number] DOB:  05-28-1966  Subjective: Connor Ross is an 55 y.o. year old male who is a primary patient of [name], [name], MD.  The CCM team was consulted for assistance with disease management and care coordination needs.    Engaged with patient face to face for follow up visit for pharmacy case management and/or care coordination services.   Objective:  Medications Reviewed Today     Reviewed by De Hollingshead, RPH-CPP (Pharmacist) on 05/16/21 at 1410  Med List Status: <None>   Medication Order Taking? Sig Documenting Provider Last Dose Status Informant  amLODipine (NORVASC) 2.5 MG tablet [id number] Yes TAKE 1 TABLET EVERY DAY [name], MD Taking Active   ASPIRIN LOW DOSE 81 MG EC tablet [id number] Yes TAKE 1 TABLET BY MOUTH  DAILY (SWALLOW WHOLE) [name], MD Taking Active   blood glucose meter kit and supplies [id number]  Dispense based on patient and insurance preference. Use up to four times daily as directed. (FOR ICD-10 E10.9, E11.9). Use to check blood sugar three times daily [name], MD  Active   Continuous Blood Gluc Sensor (FREESTYLE LIBRE 2 SENSOR) [place] [id number]  Use to check sugar at least 4 times daily [name], MD  Active   empagliflozin (JARDIANCE) 25 MG TABS tablet [id number]  Take 1 tablet (25 mg total) by mouth daily. [name], MD  Active   furosemide (LASIX) 20 MG tablet [id number] Yes Take 1 tablet (20 mg total) by mouth every other day. [name], MD Taking Active   gabapentin (NEURONTIN) 100 MG capsule [id number] Yes TAKE 1 CAPSULE THREE TIMES DAILY [name], MD Taking Active   insulin aspart (NOVOLOG FLEXPEN) 100 UNIT/ML FlexPen [id number] Yes Inject up to 25 units with breakfast, 25 units with lunch, and 30 units with supper [name], MD Taking Active            Med Note ([place] May 16, 2021   2:08 PM) 30-50 units TID with meals  insulin degludec (TRESIBA FLEXTOUCH) 200 UNIT/ML FlexTouch Pen [id number] Yes Inject 88 Units into the skin daily.  Patient taking differently: Inject 86 Units into the skin daily.   [name], MD Taking Active            Med Note Darnelle Maffucci, Lavonna Rua May 16, 2021  2:08 PM)    Insulin Pen Needle 32G X 6 MM MISC [id number]  Use daily with insulin pen [name], MD  Active   Lancets (FREESTYLE) lancets [id number]  1 each by Other route 2 (two) times daily. DX 250.02 [name], MD  Active   losartan (COZAAR) 100 MG tablet [id number] Yes TAKE 1 TABLET (100 MG TOTAL) BY MOUTH DAILY. [name], MD Taking Active   [facility] VERIO test strip [id number]  USE TO CHECK BLOOD SUGAR 3  TIMES DAILY [name], MD  Active   rosuvastatin (CRESTOR) 10 MG tablet [id number] Yes Take 1 tablet daily [name], MD Taking Active   Semaglutide, 2 MG/DOSE, (OZEMPIC, 2 MG/DOSE,) 8 MG/3ML SOPN [id number] Yes Inject 2 mg into the skin once a week. [name], MD Taking Active   sildenafil (VIAGRA) 50 MG tablet [id number] Yes Take 1 tablet (50 mg total) by mouth daily as needed for erectile dysfunction. [name], MD  Taking Active   tamsulosin (FLOMAX) 0.4 MG CAPS capsule [id number] Yes TAKE 1 CAPSULE EVERY DAY [name], MD Taking Active   traMADol (ULTRAM) 50 MG tablet [id number] Yes Take 1 tablet (50 mg total) by mouth every 12 (twelve) hours as needed. [name], MD Taking Active             Pertinent Labs:   Lab Results  Component Value Date   HGBA1C 7.7 (H) 03/08/2021   Lab Results  Component Value Date   CHOL 132 03/08/2021   HDL 24.90 (L) 03/08/2021   LDLCALC 67 03/08/2021   LDLDIRECT 119.0 11/21/2020   TRIG 200.0 (H) 03/08/2021   CHOLHDL 5 03/08/2021   Lab Results  Component Value Date   CREATININE 0.69 03/08/2021   BUN 9 03/08/2021   NA 138 03/08/2021   K 4.0 03/08/2021   CL 101 03/08/2021   CO2 25  03/08/2021     SDOH:  (Social Determinants of Health) assessments and interventions performed: Yes SDOH Interventions    Flowsheet Row Most Recent Value  SDOH Interventions   Financial Strain Interventions Other (Comment)  [manufacturer assistance]       CCM Care Plan  Review of patient past medical history, allergies, medications, health status, including review of consultants reports, laboratory and other test data, was performed as part of comprehensive evaluation and provision of chronic care management services.   Conditions to be addressed/monitored:  Hypertension, Hyperlipidemia, and Diabetes  Care Plan : Medication Management  Updates made by De Hollingshead, RPH-CPP since 05/16/2021 12:00 AM     Problem: Diabetes, Hypertension, Hyperlipidemia      Long-Range Goal: Disease Progression Prevention   Recent Progress: On track  Priority: High  Note:   Current Barriers:  Unable to independently afford treatment regimen Unable to achieve control of diabetes, hyperlipidemia   Pharmacist Clinical Goal(s):  Over the next 90 days, patient will verbalize ability to afford treatment regimen. Over the next 90 days, patient will  achieve control of diabetes as evidenced by improvement in A1c through collaboration with [name] and provider  Interventions: 1:1 collaboration with [name], MD regarding development and update of comprehensive plan of care as evidenced by provider attestation and co-signature Inter-disciplinary care team collaboration (see longitudinal plan of care) Comprehensive medication review performed; medication list updated in electronic medical record  Diabetes: Controlled per CGM, fructosamine; current treatment: Ozempic 2 mg, Tresiba U200 86-88 units daily, Novolog 30-35 units w/ breakfast, 20 units w/lunch and 30-35 unit w/dinner; Jardiance 25 mg daily Reports that his reader started reporting errors in the past few weeks, so he called  customer service and received a new reader. Starting using that yesterday, so we downloaded previous reader's data.  Hx intolerance to metformin Current glucose readings: using Libre 2 CGM Date of Download: 10/14-10/27/22 % Time CGM is active: 96% Average Glucose: 118 mg/dL Glucose Management Indicator: 6.1  Glucose Variability: 37.2 (goal <36%) Time in Goal:  - Time in range 70-180: 82% - Time above range: 8% - Time below range: 10% Observed patterns: unexpected low blood sugars, though during the time his sensor/reader was acting up Last A1c higher than anticipated, but CGM and fructosamine correlated with improved glycemic control. Recommend to continue current regimen at this time. Patient advised to contact us if more hypoglycemia once he has been on new reader/device for longer Continue current regimen Will collaborate w/ CPhT, patient, and providers to reapply for patient assistance for [place], Tresiba, [place], [place]  for 2023. In Coverage Gap but over income for SGLT2 assistance. Providing Jardiance sample today to tide over until 2023.   Hypertension: Controlled per last office visit; current treatment: losartan 100 mg daily, amlodipine 2.5 mg daily, furosemide 20 mg every other daily Previously recommended to continue current regimen at this time.  Hyperlipidemia: Controlled per last lipid panel; current treatment: rosuvastatin 10 mg daily  Antiplatelet regimen: aspirin 81 mg daily  Recommended to continue current regimen at this time  Peripheral Neuropathy: Uncontrolled; current regimen: gabapentin 100 mg TID Previously recommended to continue current regimen at this time  Patient Goals/Self-Care Activities Over the next 90 days, patient will:  - take medications as prescribed check blood glucose at least at least three times daily using CGM, document, and provide at future appointments collaborate with provider on medication access solutions       Plan: Face  to Face appointment with care management team member scheduled for: 6 months   [name], [name], [place], [place] Clinical Pharmacist [employer] at [employer] [phone]

## 2021-05-28 DIAGNOSIS — E1165 Type 2 diabetes mellitus with hyperglycemia: Secondary | ICD-10-CM | POA: Diagnosis not present

## 2021-06-13 DIAGNOSIS — R809 Proteinuria, unspecified: Secondary | ICD-10-CM

## 2021-06-13 DIAGNOSIS — E785 Hyperlipidemia, unspecified: Secondary | ICD-10-CM

## 2021-06-13 DIAGNOSIS — E1129 Type 2 diabetes mellitus with other diabetic kidney complication: Secondary | ICD-10-CM

## 2021-06-13 DIAGNOSIS — E1165 Type 2 diabetes mellitus with hyperglycemia: Secondary | ICD-10-CM

## 2021-06-20 ENCOUNTER — Telehealth: Payer: Self-pay | Admitting: Pharmacy Technician

## 2021-06-20 DIAGNOSIS — Z596 Low income: Secondary | ICD-10-CM

## 2021-06-20 NOTE — Progress Notes (Signed)
[facility] [facility])                                            [place] Team    06/20/2021  JAMELL LAYMON 09-24-1965 [id number]  FOR 2023 RE ENROLLMENT                                      Medication Assistance Referral  Referral From: [facility] Embedded RPh Catie T.   Medication/Company: Larna Daughters / Novo Nordisk Patient application portion: Gaffer portion: Interoffice Mailed to Dr. [name] Provider address/fax verified via: Office website  Medication/Company: Cira Servant / [employer] Patient application portion: Gaffer portion: Interoffice Mailed to Dr. [name] Provider address/fax verified via: Office website  Medication/Company: Tyler Aas / [employer] Patient application portion: Gaffer portion: Interoffice Mailed to Dr. [name] Provider address/fax verified via: [place]  Received both patient and provider portion(s) of patient assistance application(s) for [employer], [place]. Faxed completed application and required documents into [employer].    Ronav Furney P. Earnest Mcgillis, [place]  [phone]

## 2021-06-22 LAB — HM DIABETES EYE EXAM

## 2021-06-28 DIAGNOSIS — E1165 Type 2 diabetes mellitus with hyperglycemia: Secondary | ICD-10-CM | POA: Diagnosis not present

## 2021-07-26 ENCOUNTER — Telehealth: Payer: Self-pay | Admitting: Pharmacy Technician

## 2021-07-26 DIAGNOSIS — Z596 Low income: Secondary | ICD-10-CM

## 2021-07-26 NOTE — Progress Notes (Signed)
[facility])                                            [place] Team    07/26/2021  Connor Ross Aug 18, 1965 [id number]  Care coordination call placed to [employer] in regard to Novolog, [place] application.  Spoke to [place] who informed patient was APPROVED 07/15/21-07/14/22. Refills will begin to process in January for delivery to the provider's office based on last refill date in 2022. Deliveries are taking 30 days to arrive at provider's offices once orders have been submitted to processing.  Anneth Brunell P. Nelwyn [place], [place]  [phone]

## 2021-07-29 DIAGNOSIS — E1165 Type 2 diabetes mellitus with hyperglycemia: Secondary | ICD-10-CM | POA: Diagnosis not present

## 2021-08-20 ENCOUNTER — Telehealth: Payer: Self-pay

## 2021-08-20 NOTE — Telephone Encounter (Signed)
Received pt's patient assistance medication in office and it is ready for pick up. Pt is aware.   Connor Ross: 6 boxes Novolog: 8 boxes Pen Needles: 5 boxes

## 2021-08-21 ENCOUNTER — Telehealth: Payer: Self-pay | Admitting: Internal Medicine

## 2021-08-21 DIAGNOSIS — E1159 Type 2 diabetes mellitus with other circulatory complications: Secondary | ICD-10-CM

## 2021-08-21 DIAGNOSIS — E669 Obesity, unspecified: Secondary | ICD-10-CM

## 2021-08-21 NOTE — Telephone Encounter (Signed)
Pt has picked up medications.  ?

## 2021-08-21 NOTE — Telephone Encounter (Signed)
Patient would like to have labs done before his 09/11/2021 appt. No orders in chart.

## 2021-08-21 NOTE — Telephone Encounter (Signed)
Pt would like to have lab done prior to appt on 09/11/2021. I have ordered A1c, CMP, and Lipid panel. Is there anything else that needs to be ordered.

## 2021-08-29 DIAGNOSIS — E1165 Type 2 diabetes mellitus with hyperglycemia: Secondary | ICD-10-CM | POA: Diagnosis not present

## 2021-09-04 ENCOUNTER — Telehealth: Payer: Self-pay

## 2021-09-04 NOTE — Telephone Encounter (Signed)
Received pt's patient assistance medication in the office today. Pt is aware it's ready for pick up.   Ozempic: 1 box

## 2021-09-07 ENCOUNTER — Other Ambulatory Visit (INDEPENDENT_AMBULATORY_CARE_PROVIDER_SITE_OTHER): Payer: Medicare HMO

## 2021-09-07 ENCOUNTER — Telehealth: Payer: Self-pay

## 2021-09-07 ENCOUNTER — Other Ambulatory Visit: Payer: Self-pay

## 2021-09-07 DIAGNOSIS — E1159 Type 2 diabetes mellitus with other circulatory complications: Secondary | ICD-10-CM

## 2021-09-07 DIAGNOSIS — I10 Essential (primary) hypertension: Secondary | ICD-10-CM | POA: Diagnosis not present

## 2021-09-07 DIAGNOSIS — E669 Obesity, unspecified: Secondary | ICD-10-CM

## 2021-09-07 DIAGNOSIS — E1169 Type 2 diabetes mellitus with other specified complication: Secondary | ICD-10-CM | POA: Diagnosis not present

## 2021-09-07 LAB — COMPREHENSIVE METABOLIC PANEL
ALT: 31 U/L (ref 0–53)
AST: 15 U/L (ref 0–37)
Albumin: 4.4 g/dL (ref 3.5–5.2)
Alkaline Phosphatase: 78 U/L (ref 39–117)
BUN: 13 mg/dL (ref 6–23)
CO2: 29 mEq/L (ref 19–32)
Calcium: 9.2 mg/dL (ref 8.4–10.5)
Chloride: 102 mEq/L (ref 96–112)
Creatinine, Ser: 0.73 mg/dL (ref 0.40–1.50)
GFR: 101 mL/min (ref >60.00)
Glucose, Bld: 110 mg/dL — ABNORMAL HIGH (ref 70–99)
Potassium: 4.2 mEq/L (ref 3.5–5.1)
Sodium: 137 mEq/L (ref 135–145)
Total Bilirubin: 0.4 mg/dL (ref 0.2–1.2)
Total Protein: 7.1 g/dL (ref 6.0–8.3)

## 2021-09-07 LAB — LIPID PANEL
Cholesterol: 146 mg/dL (ref 0–200)
HDL: 25.4 mg/dL — ABNORMAL LOW (ref >39.00)
NonHDL: 120.69
Total CHOL/HDL Ratio: 6
Triglycerides: 210 mg/dL — ABNORMAL HIGH (ref 0.0–149.0)
VLDL: 42 mg/dL — ABNORMAL HIGH (ref 0.0–40.0)

## 2021-09-07 LAB — LDL CHOLESTEROL, DIRECT: Direct LDL: 73 mg/dL

## 2021-09-07 LAB — HEMOGLOBIN A1C: Hgb A1c MFr Bld: 8.2 % — ABNORMAL HIGH (ref 4.6–6.5)

## 2021-09-07 NOTE — Telephone Encounter (Signed)
Spoke with patient about why he only received 1 box. Will talk to CPhT. Per her documentation on 07/26/21 "Spoke to [place] who informed patient was APPROVED 07/15/21-07/14/22. 8 boxes of Novolog and 6 boxes of Tresiba were released on 07/18/21 and 5 boxes of Ozempic were released on 07/24/21. Deliveries are taking 30 days to arrive at provider's offices once orders have been submitted to processing"

## 2021-09-07 NOTE — Telephone Encounter (Signed)
Patient came in today to pick up his patient assistance Ozempic pen today at 8:50 am.

## 2021-09-11 ENCOUNTER — Encounter: Payer: Self-pay | Admitting: Internal Medicine

## 2021-09-11 ENCOUNTER — Ambulatory Visit (INDEPENDENT_AMBULATORY_CARE_PROVIDER_SITE_OTHER): Payer: Medicare HMO | Admitting: Internal Medicine

## 2021-09-11 ENCOUNTER — Other Ambulatory Visit: Payer: Self-pay

## 2021-09-11 VITALS — BP 136/68 | HR 82 | Temp 97.9°F | Ht 70.0 in | Wt 314.6 lb

## 2021-09-11 DIAGNOSIS — E669 Obesity, unspecified: Secondary | ICD-10-CM

## 2021-09-11 DIAGNOSIS — M5416 Radiculopathy, lumbar region: Secondary | ICD-10-CM

## 2021-09-11 DIAGNOSIS — E1159 Type 2 diabetes mellitus with other circulatory complications: Secondary | ICD-10-CM

## 2021-09-11 DIAGNOSIS — M48061 Spinal stenosis, lumbar region without neurogenic claudication: Secondary | ICD-10-CM

## 2021-09-11 DIAGNOSIS — Z6841 Body Mass Index (BMI) 40.0 and over, adult: Secondary | ICD-10-CM | POA: Diagnosis not present

## 2021-09-11 DIAGNOSIS — E785 Hyperlipidemia, unspecified: Secondary | ICD-10-CM

## 2021-09-11 DIAGNOSIS — E1169 Type 2 diabetes mellitus with other specified complication: Secondary | ICD-10-CM | POA: Diagnosis not present

## 2021-09-11 DIAGNOSIS — I152 Hypertension secondary to endocrine disorders: Secondary | ICD-10-CM | POA: Diagnosis not present

## 2021-09-11 MED ORDER — TRAMADOL HCL 50 MG PO TABS: 50.0000 mg | ORAL_TABLET | Freq: Two times a day (BID) | ORAL | 5 refills | Status: DC | PRN

## 2021-09-11 MED ORDER — TETANUS-DIPHTH-ACELL PERTUSSIS 5-2.5-18.5 LF-MCG/0.5 IM SUSY: 0.5000 mL | PREFILLED_SYRINGE | Freq: Once | INTRAMUSCULAR | 0 refills | Status: AC

## 2021-09-11 MED ORDER — ZOSTER VAC RECOMB ADJUVANTED 50 MCG/0.5ML IM SUSR: 0.5000 mL | Freq: Once | INTRAMUSCULAR | 1 refills | Status: AC

## 2021-09-11 NOTE — Assessment & Plan Note (Addendum)
A1c has risen to 8.2 on ozempic 2 mg. Jardiance 25 g .  tresiba 86 units.  And   novolog.  Will address insulin levels once his data can be downloaded and will check fructosamine for more accurate correlation .  Has 10 doses of ozempic 2 mg left  Via patient assistance. Continue rosuvastatin.  Eye exam done by [facility] last week

## 2021-09-11 NOTE — Patient Instructions (Addendum)
No medication changes today until I can review your  most recent blood sugars

## 2021-09-11 NOTE — Progress Notes (Signed)
Subjective:  Patient ID: Connor Ross, male    DOB: Nov 19, 1965  Age: 56 y.o. MRN: [id number]  CC: The primary encounter diagnosis was Hyperlipidemia with target LDL less than 70. Diagnoses of Obesity, diabetes, and hypertension syndrome ([place]), Morbid obesity with BMI of 45.0-49.9, adult ([place]), and Spinal stenosis of lumbar region with radiculopathy were also pertinent to this visit.   This visit occurred during the SARS-CoV-2 public health emergency.  Safety protocols were in place, including screening questions prior to the visit, additional usage of staff PPE, and extensive cleaning of exam room while observing appropriate contact time as indicated for disinfecting solutions.    HPI Connor Ross presents for  Chief Complaint  Patient presents with   Follow-up    6 month follow up on diabetes   1) T2DM:  taking ozempic  2 mg  , jardiance,   tresiba 86 units,  novolog qac.  Uses a SSI based on pre meal eating and carbohydrate estimation . Suspends dose for pre meal 90. A1c had risen to 8.2   admits that he has been indulging in candy several nights per week  since October.  Also Notices high readings for the first 24 hours when he puts a new sensor on his arm for the CBG monitor.   He has 10 doses of ozempic left and follows up with Connor Ross in may 23  unable to download CBGs today due to technical issues.    2) HTN: Hypertension: patient  does not check checks blood pressure twice weekly at home.  Readings have been for the most part > 140/80 at rest . Patient is following a reduce salt diet most days and is taking medications as prescribed (amlodipine losartan)   3) Chronic back pain:  using tramadol most days 2 or less per day.     Outpatient Medications Prior to Visit  Medication Sig Dispense Refill   amLODipine (NORVASC) 2.5 MG tablet TAKE 1 TABLET EVERY DAY 90 tablet 3   ASPIRIN LOW DOSE 81 MG EC tablet TAKE 1 TABLET BY MOUTH  DAILY (SWALLOW WHOLE) 90 tablet 1    blood glucose meter kit and supplies Dispense based on patient and insurance preference. Use up to four times daily as directed. (FOR ICD-10 E10.9, E11.9). Use to check blood sugar three times daily 1 each 0   Continuous Blood Gluc Sensor (FREESTYLE LIBRE 2 SENSOR) MISC Use to check sugar at least 4 times daily 1 each 2   empagliflozin (JARDIANCE) 25 MG TABS tablet Take 1 tablet (25 mg total) by mouth daily. 90 tablet 3   furosemide (LASIX) 20 MG tablet Take 1 tablet (20 mg total) by mouth every other day. 45 tablet 3   gabapentin (NEURONTIN) 100 MG capsule TAKE 1 CAPSULE THREE TIMES DAILY 270 capsule 3   insulin aspart (NOVOLOG FLEXPEN) 100 UNIT/ML FlexPen Inject up to 25 units with breakfast, 25 units with lunch, and 30 units with supper 72 mL 2   insulin degludec (TRESIBA FLEXTOUCH) 200 UNIT/ML FlexTouch Pen Inject 88 Units into the skin daily. (Patient taking differently: Inject 86 Units into the skin daily.) 48 mL 2   Insulin Pen Needle 32G X 6 MM MISC Use daily with insulin pen 50 each 0   Lancets (FREESTYLE) lancets 1 each by Other route 2 (two) times daily. DX 250.02 100 each 12   losartan (COZAAR) 100 MG tablet TAKE 1 TABLET (100 MG TOTAL) BY MOUTH DAILY. 90 tablet 1   ONETOUCH  VERIO test strip USE TO CHECK BLOOD SUGAR 3  TIMES DAILY 300 strip 3   rosuvastatin (CRESTOR) 10 MG tablet Take 1 tablet daily 90 tablet 3   Semaglutide, 2 MG/DOSE, (OZEMPIC, 2 MG/DOSE,) 8 MG/3ML SOPN Inject 2 mg into the skin once a week. 8 mL 2   sildenafil (VIAGRA) 50 MG tablet Take 1 tablet (50 mg total) by mouth daily as needed for erectile dysfunction. 10 tablet 5   tamsulosin (FLOMAX) 0.4 MG CAPS capsule TAKE 1 CAPSULE EVERY DAY 90 capsule 3   traMADol (ULTRAM) 50 MG tablet Take 1 tablet (50 mg total) by mouth every 12 (twelve) hours as needed. 60 tablet 5   No facility-administered medications prior to visit.    Review of Systems;  Patient denies headache, fevers, malaise, unintentional weight loss,  skin rash, eye pain, sinus congestion and sinus pain, sore throat, dysphagia,  hemoptysis , cough, dyspnea, wheezing, chest pain, palpitations, orthopnea, edema, abdominal pain, nausea, melena, diarrhea, constipation, flank pain, dysuria, hematuria, urinary  Frequency, nocturia, numbness, tingling, seizures,  Focal weakness, Loss of consciousness,  Tremor, insomnia, depression, anxiety, and suicidal ideation.      Objective:  BP 136/68 (BP Location: Left Arm, Patient Position: Sitting, Cuff Size: Large)    Pulse 82    Temp 97.9 F (36.6 C) (Oral)    Ht _0  (1.778 m)    Wt (!) 314 lb 9.6 oz (142.7 kg)    SpO2 91%    BMI 45.14 kg/m   BP Readings from Last 3 Encounters:  09/11/21 136/68  03/12/21 138/66  12/04/20 132/66    Wt Readings from Last 3 Encounters:  09/11/21 (!) 314 lb 9.6 oz (142.7 kg)  03/12/21 (!) 316 lb 3.2 oz (143.4 kg)  01/03/21 (!) 319 lb (144.7 kg)    General appearance: alert, cooperative and appears stated age Ears: normal TM's and external ear canals both ears Throat: lips, mucosa, and tongue normal; teeth and gums normal Neck: no adenopathy, no carotid bruit, supple, symmetrical, trachea midline and thyroid not enlarged, symmetric, no tenderness/mass/nodules Back: symmetric, no curvature. ROM normal. No CVA tenderness. Lungs: clear to auscultation bilaterally Heart: regular rate and rhythm, S1, S2 normal, no murmur, click, rub or gallop Abdomen: soft, non-tender; bowel sounds normal; no masses,  no organomegaly Pulses: 2+ and symmetric Skin: Skin color, texture, turgor normal. No rashes or lesions Lymph nodes: Cervical, supraclavicular, and axillary nodes normal.  Lab Results  Component Value Date   HGBA1C 8.2 (H) 09/07/2021   HGBA1C 7.7 (H) 03/08/2021   HGBA1C 7.8 (H) 11/21/2020    Lab Results  Component Value Date   CREATININE 0.73 09/07/2021   CREATININE 0.69 03/08/2021   CREATININE 0.76 11/21/2020    Lab Results  Component Value Date   WBC  7.8 04/19/2019   HGB 14.9 04/19/2019   HCT 44.9 04/19/2019   PLT 259.0 04/19/2019   GLUCOSE 110 (H) 09/07/2021   CHOL 146 09/07/2021   TRIG 210.0 (H) 09/07/2021   HDL 25.40 (L) 09/07/2021   LDLDIRECT 73.0 09/07/2021   LDLCALC 67 03/08/2021   ALT 31 09/07/2021   AST 15 09/07/2021   NA 137 09/07/2021   K 4.2 09/07/2021   CL 102 09/07/2021   CREATININE 0.73 09/07/2021   BUN 13 09/07/2021   CO2 29 09/07/2021   TSH 2.43 04/19/2019   PSA 0.33 03/08/2021   HGBA1C 8.2 (H) 09/07/2021   MICROALBUR 7.4 (H) 03/08/2021    DG Foot Complete Left  Result Date:  04/29/2019 CLINICAL DATA:  56 year old male with a history of nonhealing wound EXAM: LEFT FOOT - COMPLETE 3+ VIEW COMPARISON:  04/15/2018, FINDINGS: No acute displaced fracture. Degenerative changes of the tibiotalar joint, subtalar joint, enthesopathic changes of Achilles insertion and plantar fascia insertion. Degenerative changes of the midfoot. Valgus deformity of the 2-4 metatarsophalangeal joints, with no dislocation. Degenerative changes of the interphalangeal joints. No erosive changes of the distal phalanx of the great toe. There is lucency associated with the dorsal aspect of the distal great toe with no radiopaque foreign body. IMPRESSION: No acute bony abnormality, with no erosive changes of the great toe. Soft tissue changes on the dorsum of the great toe compatible with given history Degenerative changes of the foot Electronically Signed   By: Corrie Mckusick D.O.   On: 04/29/2019 08:38    Assessment & Plan:   Problem List Items Addressed This Visit     Hyperlipidemia with target LDL less than 70 - Primary   Relevant Orders   Lipid panel   Direct LDL   Obesity, diabetes, and hypertension syndrome (HCC)    A1c has risen to 8.2 on ozempic 2 mg. Jardiance 25 g .  tresiba 86 units.  And   novolog.  Will address insulin levels once his data can be downloaded and will check fructosamine for more accurate correlation .  Has 10 doses  of ozempic 2 mg left  Via patient assistance. Continue rosuvastatin.  [facility] exam done by [facility] last week       Relevant Orders   Comprehensive metabolic panel   Hemoglobin A1c   Microalbumin / creatinine urine ratio   Fructosamine   Morbid obesity with BMI of 45.0-49.9, adult (HCC)   Spinal stenosis of lumbar region with radiculopathy   Relevant Medications   traMADol (ULTRAM) 50 MG tablet    I spent 30 minutes dedicated to the care of this patient on the date of this encounter to include pre-visit review of patient's medical history,  most recent imaging studies, Face-to-face time with the patient , and post visit ordering of testing and therapeutics.    Follow-up: No follow-ups on file.   [name], MD

## 2021-09-14 LAB — FRUCTOSAMINE: Fructosamine: 291 umol/L — ABNORMAL HIGH (ref 205–285)

## 2021-09-18 ENCOUNTER — Ambulatory Visit: Payer: Self-pay | Admitting: Pharmacist

## 2021-09-18 NOTE — Chronic Care Management (AMB) (Signed)
?  Chronic Care Management  ? ?Note ? ?09/18/2021 ?Name: Connor Ross MRN: [id number] DOB: May 10, 1966 ? ? ? ?Closing pharmacy CCM case at this time. Patient has clinic contact information for future questions or concerns.  ? ?[name], [name], [place], CPP ?Clinical Pharmacist ?Therapist, music at [employer] ?[phone] ? ?

## 2021-09-18 NOTE — Patient Instructions (Signed)
Hi Ken,  ? ?I am being asked to quickly transition into another role within the health system, so I am unable to keep our next appointment. Please continue to follow up with your primary care provider as scheduled.  ? ?As a reminder - [employer] Patient Assistance Program has an auto-refill program where you do not need to call and request a refill each time. You should receive your medication shipment before you run out of your medication. However, if you have any issues or need assistance, you can call them at [phone]. They are available Monday though Friday 8:00 AM - 8:00 PM.  ? ? ?It has been a pleasure working with you! ? ?[name], [name] ? ?

## 2021-09-19 ENCOUNTER — Other Ambulatory Visit: Payer: Self-pay | Admitting: Internal Medicine

## 2021-09-19 DIAGNOSIS — E785 Hyperlipidemia, unspecified: Secondary | ICD-10-CM

## 2021-09-23 ENCOUNTER — Encounter: Payer: Self-pay | Admitting: Internal Medicine

## 2021-09-24 ENCOUNTER — Other Ambulatory Visit: Payer: Self-pay | Admitting: Internal Medicine

## 2021-09-24 ENCOUNTER — Telehealth: Payer: Self-pay | Admitting: Internal Medicine

## 2021-09-24 DIAGNOSIS — E1159 Type 2 diabetes mellitus with other circulatory complications: Secondary | ICD-10-CM

## 2021-09-24 DIAGNOSIS — E669 Obesity, unspecified: Secondary | ICD-10-CM

## 2021-09-24 NOTE — Telephone Encounter (Signed)
Pt is aware that lab has been ordered.  ?

## 2021-09-24 NOTE — Telephone Encounter (Signed)
Patient called and was checking on the status of MyChart message. ?

## 2021-09-24 NOTE — Telephone Encounter (Signed)
Patient has been scheduled for labs on 12/21/2021 and wanted to know if Dr [name] wanted to add Fructosamin to his labs.  ?

## 2021-09-26 ENCOUNTER — Telehealth: Payer: Self-pay

## 2021-09-26 NOTE — Telephone Encounter (Signed)
Pt advised Ozempic is here and ready for pick up ?

## 2021-09-27 ENCOUNTER — Other Ambulatory Visit: Payer: Self-pay

## 2021-09-27 ENCOUNTER — Other Ambulatory Visit: Payer: Self-pay | Admitting: Internal Medicine

## 2021-09-27 ENCOUNTER — Encounter: Payer: Self-pay | Admitting: Internal Medicine

## 2021-09-27 ENCOUNTER — Telehealth (INDEPENDENT_AMBULATORY_CARE_PROVIDER_SITE_OTHER): Payer: Medicare HMO | Admitting: Internal Medicine

## 2021-09-27 VITALS — BP 148/84 | HR 80 | Temp 96.8°F | Ht 70.0 in | Wt 314.0 lb

## 2021-09-27 DIAGNOSIS — I152 Hypertension secondary to endocrine disorders: Secondary | ICD-10-CM

## 2021-09-27 DIAGNOSIS — R0602 Shortness of breath: Secondary | ICD-10-CM

## 2021-09-27 DIAGNOSIS — J069 Acute upper respiratory infection, unspecified: Secondary | ICD-10-CM

## 2021-09-27 DIAGNOSIS — R0981 Nasal congestion: Secondary | ICD-10-CM | POA: Diagnosis not present

## 2021-09-27 DIAGNOSIS — J019 Acute sinusitis, unspecified: Secondary | ICD-10-CM

## 2021-09-27 DIAGNOSIS — R051 Acute cough: Secondary | ICD-10-CM

## 2021-09-27 DIAGNOSIS — J9801 Acute bronchospasm: Secondary | ICD-10-CM

## 2021-09-27 DIAGNOSIS — E1159 Type 2 diabetes mellitus with other circulatory complications: Secondary | ICD-10-CM | POA: Diagnosis not present

## 2021-09-27 MED ORDER — FLUTICASONE PROPIONATE 50 MCG/ACT NA SUSP: 2.0000 | Freq: Every day | NASAL | 2 refills | Status: DC

## 2021-09-27 MED ORDER — ALBUTEROL SULFATE HFA 108 (90 BASE) MCG/ACT IN AERS: 2.0000 | INHALATION_SPRAY | Freq: Four times a day (QID) | RESPIRATORY_TRACT | 0 refills | Status: DC | PRN

## 2021-09-27 MED ORDER — AMOXICILLIN-POT CLAVULANATE 875-125 MG PO TABS: 1.0000 | ORAL_TABLET | Freq: Two times a day (BID) | ORAL | 0 refills | Status: DC

## 2021-09-27 NOTE — Progress Notes (Signed)
Virtual Visit via Video Note ? ?I connected with Connor Ross  ? on 09/27/21 at  8:40 AM EDT by a video enabled telemedicine application and verified that I am speaking with the correct person using two identifiers. ? Location patient: [place] ?Location provider:work or home office ?Persons participating in the virtual visit: patient, provider ? ?I discussed the limitations and requested verbal permission for telemedicine visit. The patient expressed understanding and agreed to proceed. ? ? ?HPI: ? ?Sick visit since Saturday pm wife was sick prior and she took covid test negative he was having sinus pressure Sunday and Monday and h/a x 2 days chest and nasal congestion tried saline and eucalyptus and natural oils/supplements for allergies. No fever. He is feeling sob, cough not worse at night, chills, fatigue, chest and head congestion declines testing for covid, strep and flu due to anxiety increased now due to being sick and does not want to leave the house  ?He does use cpap qhs and it is not humidified and he felt like he was drowning due to could not breath though nose caused increased anxiety  ? ?DM 2 and htn ?Cbg this am 110  ?Vitals checked this am BP elevated but takes norvasc 2.5 and losartan 100 mg qhs  ? ? ?-Pertinent past medical history: see below ?-Pertinent medication allergies: ?Allergies  ?Allergen Reactions  ? Hctz [Hydrochlorothiazide] Other (See Comments)  ?  Extreme Cramping  ? Metformin And Related Diarrhea  ? ?-COVID-19 vaccine status:  ?Immunization History  ?Administered Date(s) Administered  ? Tdap 10/16/2011  ? ? ? ?ROS: See pertinent positives and negatives per HPI. ? ?Past Medical History:  ?Diagnosis Date  ? Diabetes mellitus   ? 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 type 2 diabetes mellitus, limited to breakdown of skin ([place]) 04/23/2019  ? Fracture of one rib, left side, subsequent encounter for fracture with routine healing 08/27/2020  ? Hyperlipidemia   ? Hypertension   ? Normal  cardiac stress test 2012  ? [facility]  ? ? ?Past Surgical History:  ?Procedure Laterality Date  ? EXOSTECTOMY Right 06/2019  ? 5th MT head  ([facility])  ? LUMBAR DISC SURGERY    ? ? ? ?Current Outpatient Medications:  ?  albuterol (VENTOLIN HFA) 108 (90 Base) MCG/ACT inhaler, Inhale 2 puffs into the lungs every 6 (six) hours as needed for wheezing or shortness of breath., Disp: 18 g, Rfl: 0 ?  amLODipine (NORVASC) 2.5 MG tablet, TAKE 1 TABLET EVERY DAY, Disp: 90 tablet, Rfl: 3 ?  amoxicillin-clavulanate (AUGMENTIN) 875-125 MG tablet, Take 1 tablet by mouth 2 (two) times daily. With food, Disp: 14 tablet, Rfl: 0 ?  ASPIRIN LOW DOSE 81 MG EC tablet, TAKE 1 TABLET BY MOUTH  DAILY (SWALLOW WHOLE), Disp: 90 tablet, Rfl: 1 ?  blood glucose meter kit and supplies, Dispense based on patient and insurance preference. Use up to four times daily as directed. (FOR ICD-10 E10.9, E11.9). Use to check blood sugar three times daily, Disp: 1 each, Rfl: 0 ?  Continuous Blood Gluc Sensor (FREESTYLE LIBRE 2 SENSOR) MISC, Use to check sugar at least 4 times daily, Disp: 1 each, Rfl: 2 ?  empagliflozin (JARDIANCE) 25 MG TABS tablet, Take 1 tablet (25 mg total) by mouth daily., Disp: 90 tablet, Rfl: 3 ?  fluticasone (FLONASE) 50 MCG/ACT nasal spray, Place 2 sprays into both nostrils daily. At night after nasal saline, Disp: 16 g, Rfl: 2 ?  furosemide (LASIX) 20 MG tablet, Take  1 tablet (20 mg total) by mouth every other day., Disp: 45 tablet, Rfl: 3 ?  gabapentin (NEURONTIN) 100 MG capsule, TAKE 1 CAPSULE THREE TIMES DAILY, Disp: 270 capsule, Rfl: 3 ?  insulin aspart (NOVOLOG FLEXPEN) 100 UNIT/ML FlexPen, Inject up to 25 units with breakfast, 25 units with lunch, and 30 units with supper, Disp: 72 mL, Rfl: 2 ?  insulin degludec (TRESIBA FLEXTOUCH) 200 UNIT/ML FlexTouch Pen, Inject 88 Units into the skin daily. (Patient taking differently: Inject 86 Units into the skin daily.), Disp: 48 mL, Rfl: 2 ?  Insulin Pen Needle 32G X 6 MM MISC, Use  daily with insulin pen, Disp: 50 each, Rfl: 0 ?  Lancets (FREESTYLE) lancets, 1 each by Other route 2 (two) times daily. DX 250.02, Disp: 100 each, Rfl: 12 ?  losartan (COZAAR) 100 MG tablet, TAKE 1 TABLET (100 MG TOTAL) BY MOUTH DAILY., Disp: 90 tablet, Rfl: 1 ?  ONETOUCH VERIO test strip, USE TO CHECK BLOOD SUGAR 3  TIMES DAILY, Disp: 300 strip, Rfl: 3 ?  rosuvastatin (CRESTOR) 10 MG tablet, TAKE 1 TABLET EVERY DAY, Disp: 90 tablet, Rfl: 3 ?  Semaglutide, 2 MG/DOSE, (OZEMPIC, 2 MG/DOSE,) 8 MG/3ML SOPN, Inject 2 mg into the skin once a week., Disp: 8 mL, Rfl: 2 ?  sildenafil (VIAGRA) 50 MG tablet, Take 1 tablet (50 mg total) by mouth daily as needed for erectile dysfunction., Disp: 10 tablet, Rfl: 5 ?  tamsulosin (FLOMAX) 0.4 MG CAPS capsule, TAKE 1 CAPSULE EVERY DAY, Disp: 90 capsule, Rfl: 3 ?  traMADol (ULTRAM) 50 MG tablet, Take 1 tablet (50 mg total) by mouth every 12 (twelve) hours as needed., Disp: 60 tablet, Rfl: 5 ? ?EXAM: ? ?VITALS per patient if applicable: ? ?GENERAL: alert, oriented, appears well and in no acute distress ? ?HEENT: atraumatic, conjunttiva clear, no obvious abnormalities on inspection of external nose and ears ? ?NECK: normal movements of the head and neck ? ?LUNGS: on inspection no signs of respiratory distress, breathing rate appears normal, no obvious gross SOB, gasping or wheezing ? ?CV: no obvious cyanosis ? ?MS: moves all visible extremities without noticeable abnormality ? ?PSYCH/NEURO: pleasant and cooperative, no obvious depression or anxiety, speech and thought processing grossly intact ?+anxious but no resp distress speaking in full sentences ? ?ASSESSMENT AND PLAN: ? ?Discussed the following assessment and plan: ? ?Acute non-recurrent sinusitis, URI with cough and fatigue and sob - Plan: fluticasone (FLONASE) 50 MCG/ACT nasal spray, amoxicillin-clavulanate (AUGMENTIN) 875-125 MG tablet ?Cxr if worsening or rec call 911 go to ED or [facility]  ?Continue nasal saline  ? ?Acute cough  - Plan: albuterol (VENTOLIN HFA) 108 (90 Base) MCG/ACT inhaler ? ?SOB (shortness of breath) - Plan: albuterol (VENTOLIN HFA) 108 (90 Base) MCG/ACT inhaler prn sob or cough  ? ?Nasal congestion - Plan:NS and fluticasone (FLONASE) 50 MCG/ACT nasal spray ?Declines to try otc allergy pill  ? ?Pt declines testing covid, strep and flu  ? ?These are over the counter medication options:  ?Mucinex dm green label for cough or robitussin DM  ?Multivitamin or below vitamins  ?Vitamin C 1000 mg daily.  ?Vitamin D3 4000 Iu (units) daily.  ?Zinc 100 mg daily.  ?Quercetin 250-500 mg 2 times per day   ?Elderberry  ?Oil of oregano  ?cepacol or chloroseptic spray ?Warm salt water gargles +hydrogen peroxide ?Sugar free cough drops  ?Warm tea with honey and lemon  ?Hydration  ?Try to eat though you dont feel like it   ?Tylenol  or Advil  ?Nasal saline and Flonase 2 sprays nasal congestion  ?If sneezing/runny nose over the counter allergy pill claritin,allegra, zyrtec, xyzal ?Monitor pulse oximeter, buy from [facility] if oxygen is less than 90 please go to the hospital.  ?   ?   ?Are you feeling really sick? Shortness of breath, cough, chest pain?, dizziness? Confusion  ? If so let me know  ?If worsening, go to hospital or [facility] clinic Urgent care for further treatment.  ?   ?Hypertension associated with diabetes ([place]) ?No meds this am rec if bp elevated during day take norvasc 2.5 in am and losartan qhs 100 mg instead of both at night  ?Monitor cbg freestyle and BP ? ?-we discussed possible serious and likely etiologies, options for evaluation and workup, limitations of telemedicine visit vs in person visit, treatment, treatment risks and precautions.  ?I discussed the assessment and treatment plan with the patient. The patient was provided an opportunity to ask questions and all were answered. The patient agreed with the plan and demonstrated an understanding of the instructions. ?  ? ?Tome spent 20 min ?Nino Glow McLean-Scocuzza, MD   ?

## 2021-09-27 NOTE — Patient Instructions (Signed)
If needing prescription strength medication we will need to make an appointment with a provider.  ?These are over the counter medication options:  ?Mucinex dm green label for cough or robitussin DM  ?Multivitamin or below vitamins  ?Vitamin C 1000 mg daily.  ?Vitamin D3 4000 Iu (units) daily.  ?Zinc 100 mg daily.  ?Quercetin 250-500 mg 2 times per day   ?Elderberry  ?Oil of oregano  ?cepacol or chloroseptic spray ?Warm salt water gargles +hydrogen peroxide ?Sugar free cough drops  ?Warm tea with honey and lemon  ?Hydration  ?Try to eat though you dont feel like it   ?Tylenol or Advil  ?Nasal saline and Flonase 2 sprays nasal congestion  ?If sneezing/runny nose over the counter allergy pill claritin,allegra, zyrtec, xyzal ?Quarantine x 10-14 days 14 days preferred  ? ?Monitor pulse oximeter, buy from [facility] if oxygen is less than 90 please go to the hospital.  ?   ?   ?Are you feeling really sick? Shortness of breath, cough, chest pain?, dizziness? Confusion  ? If so let me know  ?If worsening, go to hospital or [facility] clinic Urgent care for further treatment. ?Or call 911     ?

## 2021-09-29 DIAGNOSIS — E1165 Type 2 diabetes mellitus with hyperglycemia: Secondary | ICD-10-CM | POA: Diagnosis not present

## 2021-10-02 ENCOUNTER — Other Ambulatory Visit: Payer: Self-pay

## 2021-10-02 MED ORDER — BINAXNOW COVID-19 AG HOME TEST VI KIT: PACK | 0 refills | Status: DC

## 2021-10-17 ENCOUNTER — Telehealth: Payer: Self-pay | Admitting: Internal Medicine

## 2021-10-17 NOTE — Telephone Encounter (Signed)
Pt called in stating that he has an cpap that was prescribe to him by another provider... Pt was wondering if Dr. [name] have the setting of the cpap on file... Pt was wondering if Dr. [name] can write him a script for the cpap supplies... Pt stated that there is a recall on the cpap that he have now at home... Pt requesting callback... ?

## 2021-10-18 ENCOUNTER — Other Ambulatory Visit: Payer: Self-pay | Admitting: Internal Medicine

## 2021-10-18 DIAGNOSIS — I152 Hypertension secondary to endocrine disorders: Secondary | ICD-10-CM

## 2021-10-18 NOTE — Telephone Encounter (Signed)
I don't see CPAP settings in the pt's chart unless I'm not looking in the right place.  ?

## 2021-10-18 NOTE — Telephone Encounter (Signed)
Spoke with pt and he stated that he just needs a physician's order stating what his pressure setting is. He stated that he is getting a new machine but they will not set the setting without an order from the provider. If okay I will type up and place in quick sign folder for signature on Monday when back in office. Pt stated that when done he will come by to pick up. ?

## 2021-10-19 ENCOUNTER — Other Ambulatory Visit: Payer: Self-pay | Admitting: Internal Medicine

## 2021-10-19 DIAGNOSIS — R051 Acute cough: Secondary | ICD-10-CM

## 2021-10-19 DIAGNOSIS — J9801 Acute bronchospasm: Secondary | ICD-10-CM

## 2021-10-19 DIAGNOSIS — R0602 Shortness of breath: Secondary | ICD-10-CM

## 2021-10-22 NOTE — Telephone Encounter (Signed)
Letter has been printed, signed and ready for pick up. Pt is aware.  ?

## 2021-10-30 DIAGNOSIS — E1165 Type 2 diabetes mellitus with hyperglycemia: Secondary | ICD-10-CM | POA: Diagnosis not present

## 2021-11-14 ENCOUNTER — Telehealth: Payer: Self-pay

## 2021-11-14 NOTE — Telephone Encounter (Signed)
Pt assistance Novolog and [place] with Pen needles arrived. ?Pt notified, will come pick up. ?

## 2021-11-30 DIAGNOSIS — E1165 Type 2 diabetes mellitus with hyperglycemia: Secondary | ICD-10-CM | POA: Diagnosis not present

## 2021-12-04 ENCOUNTER — Ambulatory Visit: Payer: Medicare HMO

## 2021-12-13 ENCOUNTER — Other Ambulatory Visit: Payer: Self-pay | Admitting: Internal Medicine

## 2021-12-13 DIAGNOSIS — E1165 Type 2 diabetes mellitus with hyperglycemia: Secondary | ICD-10-CM

## 2021-12-14 ENCOUNTER — Telehealth: Payer: Self-pay | Admitting: Internal Medicine

## 2021-12-14 DIAGNOSIS — I152 Hypertension secondary to endocrine disorders: Secondary | ICD-10-CM

## 2021-12-14 NOTE — Telephone Encounter (Signed)
Please refer ken to [facility] pharmacist/CCM for help in getting Jardiance

## 2021-12-14 NOTE — Telephone Encounter (Signed)
Pt called in stating that he have questions about medication (JARDIANCE 25 MG TABS tablet)/[place]... Pt requesting a callback

## 2021-12-14 NOTE — Telephone Encounter (Signed)
Spoke with pt and he stated that he is now in the donut hole with his Jardianca and would like to know if he could get samples to hold him off until he comes out of the donut hole. Pt stated that Catie did this for him last year when it happened.

## 2021-12-14 NOTE — Assessment & Plan Note (Signed)
He is in the doughnout hole and cannot afford Jardaiance .  Referring to CCM for assistance [facility]

## 2021-12-17 NOTE — Telephone Encounter (Signed)
Medication Samples have been provided to the patient.  Drug name: Jardiance       Strength: 25 mg        Qty: 4 boxes  LOT: [id number]  Exp.Date: 03/2023  Dosing instructions: Take 1 tablet by mouth daily.   The patient has been instructed regarding the correct time, dose, and frequency of taking this medication, including desired effects and most common side effects.   Connor Ross 1:49 PM 12/17/2021

## 2021-12-17 NOTE — Telephone Encounter (Addendum)
Pt has P/U jardiance samples on 12/17/21 at front desk. Pt verified using 2 identifiers.

## 2021-12-17 NOTE — Telephone Encounter (Signed)
Can we give him some samples until he can get scheduled with [facility]?

## 2021-12-17 NOTE — Telephone Encounter (Signed)
I have contacted the Rep for [employer] requesting more samples for patient FYI for Dr. [name] and [name] I cc' d you on the e-mail as well.

## 2021-12-17 NOTE — Telephone Encounter (Signed)
Pt is aware that sample are up front to be picked up.

## 2021-12-21 ENCOUNTER — Other Ambulatory Visit (INDEPENDENT_AMBULATORY_CARE_PROVIDER_SITE_OTHER): Payer: Medicare HMO

## 2021-12-21 DIAGNOSIS — E785 Hyperlipidemia, unspecified: Secondary | ICD-10-CM

## 2021-12-21 DIAGNOSIS — E1165 Type 2 diabetes mellitus with hyperglycemia: Secondary | ICD-10-CM | POA: Diagnosis not present

## 2021-12-21 DIAGNOSIS — I152 Hypertension secondary to endocrine disorders: Secondary | ICD-10-CM | POA: Diagnosis not present

## 2021-12-21 DIAGNOSIS — E1159 Type 2 diabetes mellitus with other circulatory complications: Secondary | ICD-10-CM | POA: Diagnosis not present

## 2021-12-21 DIAGNOSIS — E669 Obesity, unspecified: Secondary | ICD-10-CM

## 2021-12-21 LAB — COMPREHENSIVE METABOLIC PANEL
ALT: 21 U/L (ref 0–53)
AST: 10 U/L (ref 0–37)
Albumin: 4.1 g/dL (ref 3.5–5.2)
Alkaline Phosphatase: 69 U/L (ref 39–117)
BUN: 13 mg/dL (ref 6–23)
CO2: 25 mEq/L (ref 19–32)
Calcium: 9.1 mg/dL (ref 8.4–10.5)
Chloride: 103 mEq/L (ref 96–112)
Creatinine, Ser: 0.62 mg/dL (ref 0.40–1.50)
GFR: 105.89 mL/min (ref >60.00)
Glucose, Bld: 130 mg/dL — ABNORMAL HIGH (ref 70–99)
Potassium: 3.9 mEq/L (ref 3.5–5.1)
Sodium: 137 mEq/L (ref 135–145)
Total Bilirubin: 0.4 mg/dL (ref 0.2–1.2)
Total Protein: 6.7 g/dL (ref 6.0–8.3)

## 2021-12-21 LAB — MICROALBUMIN / CREATININE URINE RATIO
Creatinine,U: 86.5 mg/dL
Microalb Creat Ratio: 16.8 mg/g (ref 0.0–30.0)
Microalb, Ur: 14.5 mg/dL — ABNORMAL HIGH (ref 0.0–1.9)

## 2021-12-21 LAB — LIPID PANEL
Cholesterol: 142 mg/dL (ref 0–200)
HDL: 27.4 mg/dL — ABNORMAL LOW (ref >39.00)
LDL Cholesterol: 75 mg/dL (ref 0–99)
NonHDL: 114.17
Total CHOL/HDL Ratio: 5
Triglycerides: 197 mg/dL — ABNORMAL HIGH (ref 0.0–149.0)
VLDL: 39.4 mg/dL (ref 0.0–40.0)

## 2021-12-21 LAB — HEMOGLOBIN A1C: Hgb A1c MFr Bld: 7.6 % — ABNORMAL HIGH (ref 4.6–6.5)

## 2021-12-21 LAB — LDL CHOLESTEROL, DIRECT: Direct LDL: 82 mg/dL

## 2021-12-23 ENCOUNTER — Other Ambulatory Visit: Payer: Self-pay | Admitting: Internal Medicine

## 2021-12-23 DIAGNOSIS — R0981 Nasal congestion: Secondary | ICD-10-CM

## 2021-12-23 DIAGNOSIS — J019 Acute sinusitis, unspecified: Secondary | ICD-10-CM

## 2021-12-24 ENCOUNTER — Telehealth: Payer: Self-pay

## 2021-12-24 NOTE — Telephone Encounter (Signed)
Medication Samples have been provided to the patient.  Drug name: Jardiance       Strength: 25 mg        Qty: 5 boxes  LOT: [id number]  Exp.Date: 03/16/2023  Dosing instructions: Take 1 tablet by mouth daily.   The patient has been instructed regarding the correct time, dose, and frequency of taking this medication, including desired effects and most common side effects.   Connor Ross 10:53 AM 12/24/2021   Pt is aware medication is ready for pick up. Meds are up front in cabinet.

## 2021-12-25 ENCOUNTER — Ambulatory Visit (INDEPENDENT_AMBULATORY_CARE_PROVIDER_SITE_OTHER): Payer: Medicare HMO | Admitting: Internal Medicine

## 2021-12-25 ENCOUNTER — Encounter: Payer: Self-pay | Admitting: Internal Medicine

## 2021-12-25 VITALS — BP 138/70 | HR 73 | Temp 98.2°F | Ht 70.0 in | Wt 322.8 lb

## 2021-12-25 DIAGNOSIS — Z125 Encounter for screening for malignant neoplasm of prostate: Secondary | ICD-10-CM

## 2021-12-25 DIAGNOSIS — M153 Secondary multiple arthritis: Secondary | ICD-10-CM | POA: Diagnosis not present

## 2021-12-25 DIAGNOSIS — M5416 Radiculopathy, lumbar region: Secondary | ICD-10-CM | POA: Diagnosis not present

## 2021-12-25 DIAGNOSIS — E1159 Type 2 diabetes mellitus with other circulatory complications: Secondary | ICD-10-CM

## 2021-12-25 DIAGNOSIS — E669 Obesity, unspecified: Secondary | ICD-10-CM

## 2021-12-25 DIAGNOSIS — E785 Hyperlipidemia, unspecified: Secondary | ICD-10-CM | POA: Diagnosis not present

## 2021-12-25 DIAGNOSIS — Z79899 Other long term (current) drug therapy: Secondary | ICD-10-CM

## 2021-12-25 DIAGNOSIS — E1169 Type 2 diabetes mellitus with other specified complication: Secondary | ICD-10-CM | POA: Diagnosis not present

## 2021-12-25 DIAGNOSIS — M48061 Spinal stenosis, lumbar region without neurogenic claudication: Secondary | ICD-10-CM

## 2021-12-25 DIAGNOSIS — I10 Essential (primary) hypertension: Secondary | ICD-10-CM

## 2021-12-25 DIAGNOSIS — Z6841 Body Mass Index (BMI) 40.0 and over, adult: Secondary | ICD-10-CM

## 2021-12-25 NOTE — Patient Instructions (Addendum)
Your control of diabetes is TOO GOOD:  YOU'RE HAVING TO EAT SWEETS TO BRING IT BACK UP AND THAT'S CAUSING YOU TO GAIN WEIGHT.  HERE'S WHAT I RECOMMEND   Increase your tresiba to 88 units   Reduce your meal time insulin to 18 units in the morning   DO NOT TAKE NOVOLOG IF SUGAR IS > 80  AND YOU ARE SKIPPING BREAKFAST . ONLY TAKE IF EATING BREAKFAST    Reduce your dinnertime doses to :  35 units if eating a salad and pre meal sugar is below 100   40 units if eating a salad and premeal sugar is between 100 and 150   45 units if eating any other meals  and  premeal sugar is < 100   50 units if eating any meals with carbs and pre meal sugar is over 150

## 2021-12-25 NOTE — Progress Notes (Signed)
Subjective:  Patient ID: Connor Ross, male    DOB: Dec 24, 1965  Age: 56 y.o. MRN: [id number]  CC: The primary encounter diagnosis was Obesity, diabetes, and hypertension syndrome ([place]). Diagnoses of Hyperlipidemia with target LDL less than 70, Prostate cancer screening, Long-term use of high-risk medication, Post-traumatic osteoarthritis of multiple joints, Spinal stenosis of lumbar region with radiculopathy, and Morbid obesity with BMI of 45.0-49.9, adult ([place]) were also pertinent to this visit.   HPI BRADDEN TADROS presents for follow up on type 2 DM and morbid obesity Chief Complaint  Patient presents with   Follow-up    3 month follow up on diabetes   1) Type 2 DM/obesity:  he is taking Tresiba 86 units daily ,  Novolog insulin 25 to 30 units  at each meal using a sliding scale , metformin and Ozempic  Jardiance   Outpatient Medications Prior to Visit  Medication Sig Dispense Refill   amLODipine (NORVASC) 2.5 MG tablet TAKE 1 TABLET EVERY DAY 90 tablet 3   ASPIRIN LOW DOSE 81 MG EC tablet TAKE 1 TABLET BY MOUTH  DAILY (SWALLOW WHOLE) 90 tablet 1   blood glucose meter kit and supplies Dispense based on patient and insurance preference. Use up to four times daily as directed. (FOR ICD-10 E10.9, E11.9). Use to check blood sugar three times daily 1 each 0   Continuous Blood Gluc Sensor (FREESTYLE LIBRE 2 SENSOR) MISC Use to check sugar at least 4 times daily 1 each 2   COVID-19 At Home Antigen Test (BINAXNOW COVID-19 AG HOME TEST) KIT Use as directed. 8 kit 0   fluticasone (FLONASE) 50 MCG/ACT nasal spray PLACE 2 SPRAYS INTO BOTH NOSTRILS DAILY. AT NIGHT AFTER NASAL SALINE 16 g 2   furosemide (LASIX) 20 MG tablet TAKE 1 TABLET (20 MG TOTAL) BY MOUTH EVERY OTHER DAY. 45 tablet 3   gabapentin (NEURONTIN) 100 MG capsule TAKE 1 CAPSULE THREE TIMES DAILY 270 capsule 3   insulin aspart (NOVOLOG FLEXPEN) 100 UNIT/ML FlexPen Inject up to 25 units with breakfast, 25 units with  lunch, and 30 units with supper 72 mL 2   insulin degludec (TRESIBA FLEXTOUCH) 200 UNIT/ML FlexTouch Pen Inject 88 Units into the skin daily. (Patient taking differently: Inject 86 Units into the skin daily.) 48 mL 2   Insulin Pen Needle 32G X 6 MM MISC Use daily with insulin pen 50 each 0   JARDIANCE 25 MG TABS tablet TAKE 1 TABLET EVERY DAY 90 tablet 3   Lancets (FREESTYLE) lancets 1 each by Other route 2 (two) times daily. DX 250.02 100 each 12   losartan (COZAAR) 100 MG tablet TAKE 1 TABLET (100 MG TOTAL) BY MOUTH DAILY. 90 tablet 1   ONETOUCH VERIO test strip USE TO CHECK BLOOD SUGAR 3  TIMES DAILY 300 strip 3   rosuvastatin (CRESTOR) 10 MG tablet TAKE 1 TABLET EVERY DAY 90 tablet 3   Semaglutide, 2 MG/DOSE, (OZEMPIC, 2 MG/DOSE,) 8 MG/3ML SOPN Inject 2 mg into the skin once a week. 8 mL 2   sildenafil (VIAGRA) 50 MG tablet Take 1 tablet (50 mg total) by mouth daily as needed for erectile dysfunction. 10 tablet 5   tamsulosin (FLOMAX) 0.4 MG CAPS capsule TAKE 1 CAPSULE EVERY DAY 90 capsule 3   traMADol (ULTRAM) 50 MG tablet Take 1 tablet (50 mg total) by mouth every 12 (twelve) hours as needed. 60 tablet 5   albuterol (VENTOLIN HFA) 108 (90 Base) MCG/ACT inhaler INHALE 2 PUFFS INTO  THE LUNGS EVERY 6 HOURS AS NEEDED FOR WHEEZING OR SHORTNESS OF BREATH (Patient not taking: Reported on 12/25/2021) 54 g 0   amoxicillin-clavulanate (AUGMENTIN) 875-125 MG tablet Take 1 tablet by mouth 2 (two) times daily. With food (Patient not taking: Reported on 12/25/2021) 14 tablet 0   No facility-administered medications prior to visit.    Review of Systems;  Patient denies headache, fevers, malaise, unintentional weight loss, skin rash, eye pain, sinus congestion and sinus pain, sore throat, dysphagia,  hemoptysis , cough, dyspnea, wheezing, chest pain, palpitations, orthopnea, edema, abdominal pain, nausea, melena, diarrhea, constipation, flank pain, dysuria, hematuria, urinary  Frequency, nocturia, numbness,  tingling, seizures,  Focal weakness, Loss of consciousness,  Tremor, insomnia, depression, anxiety, and suicidal ideation.      Objective:  BP 138/70 (BP Location: Left Arm, Patient Position: Sitting, Cuff Size: Large)   Pulse 73   Temp 98.2 F (36.8 C) (Oral)   Ht 5' 10"  (1.778 m)   Wt (!) 322 lb 12.8 oz (146.4 kg)   SpO2 98%   BMI 46.32 kg/m   BP Readings from Last 3 Encounters:  12/25/21 138/70  09/27/21 (!) 148/84  09/11/21 136/68    Wt Readings from Last 3 Encounters:  12/25/21 (!) 322 lb 12.8 oz (146.4 kg)  09/27/21 (!) 314 lb (142.4 kg)  09/11/21 (!) 314 lb 9.6 oz (142.7 kg)    General appearance: alert, cooperative and appears stated age Ears: normal TM's and external ear canals both ears Throat: lips, mucosa, and tongue normal; teeth and gums normal Neck: no adenopathy, no carotid bruit, supple, symmetrical, trachea midline and thyroid not enlarged, symmetric, no tenderness/mass/nodules Back: symmetric, no curvature. ROM normal. No CVA tenderness. Lungs: clear to auscultation bilaterally Heart: regular rate and rhythm, S1, S2 normal, no murmur, click, rub or gallop Abdomen: soft, non-tender; bowel sounds normal; no masses,  no organomegaly Pulses: 2+ and symmetric Skin: Skin color, texture, turgor normal. No rashes or lesions Lymph nodes: Cervical, supraclavicular, and axillary nodes normal.  Lab Results  Component Value Date   HGBA1C 7.6 (H) 12/21/2021   HGBA1C 8.2 (H) 09/07/2021   HGBA1C 7.7 (H) 03/08/2021    Lab Results  Component Value Date   CREATININE 0.62 12/21/2021   CREATININE 0.73 09/07/2021   CREATININE 0.69 03/08/2021    Lab Results  Component Value Date   WBC 7.8 04/19/2019   HGB 14.9 04/19/2019   HCT 44.9 04/19/2019   PLT 259.0 04/19/2019   GLUCOSE 130 (H) 12/21/2021   CHOL 142 12/21/2021   TRIG 197.0 (H) 12/21/2021   HDL 27.40 (L) 12/21/2021   LDLDIRECT 82.0 12/21/2021   LDLCALC 75 12/21/2021   ALT 21 12/21/2021   AST 10  12/21/2021   NA 137 12/21/2021   K 3.9 12/21/2021   CL 103 12/21/2021   CREATININE 0.62 12/21/2021   BUN 13 12/21/2021   CO2 25 12/21/2021   TSH 2.43 04/19/2019   PSA 0.33 03/08/2021   HGBA1C 7.6 (H) 12/21/2021   MICROALBUR 14.5 (H) 12/21/2021    DG Foot Complete Left  Result Date: 04/29/2019 CLINICAL DATA:  56 year old male with a history of nonhealing wound EXAM: LEFT FOOT - COMPLETE 3+ VIEW COMPARISON:  04/15/2018, FINDINGS: No acute displaced fracture. Degenerative changes of the tibiotalar joint, subtalar joint, enthesopathic changes of Achilles insertion and plantar fascia insertion. Degenerative changes of the midfoot. Valgus deformity of the 2-4 metatarsophalangeal joints, with no dislocation. Degenerative changes of the interphalangeal joints. No erosive changes of the distal phalanx  of the great toe. There is lucency associated with the dorsal aspect of the distal great toe with no radiopaque foreign body. IMPRESSION: No acute bony abnormality, with no erosive changes of the great toe. Soft tissue changes on the dorsum of the great toe compatible with given history Degenerative changes of the foot Electronically Signed   By: Corrie Mckusick D.O.   On: 04/29/2019 08:38    Assessment & Plan:   Problem List Items Addressed This Visit     Spinal stenosis of lumbar region with radiculopathy     His pain is managed with tramadol and gabapentin , both at low doses.   Refill history confirmed via [place] Controlled Substance databas, accessed by me today..last refill in late April for 60 tablets.  Continued Weight loss advised.        Osteoarthritis   Obesity, diabetes, and hypertension syndrome ([place]) - Primary    He ihs having frequent lows and eating cookies and ice cream on average 3 times per week.  He has gained weight.  Sliding scale novolog doses have been reduced to prevent recurrences .  Continue ozempic at 2 mg weekly       Relevant Orders   Comprehensive metabolic panel    Hemoglobin A1c   Fructosamine   Morbid obesity with BMI of 45.0-49.9, adult ([place])    He has gained weight while taking Ozempic due to recurrent hypoglycemic resulting in  Increased sugar intake       Hyperlipidemia with target LDL less than 70    Tolerating the increaesd use of  crestor to daily dosing for goal LDL 70  Lab Results  Component Value Date   CHOL 142 12/21/2021   HDL 27.40 (L) 12/21/2021   LDLCALC 75 12/21/2021   LDLDIRECT 82.0 12/21/2021   TRIG 197.0 (H) 12/21/2021   CHOLHDL 5 12/21/2021         Relevant Orders   LDL cholesterol, direct   Lipid panel   Other Visit Diagnoses     Prostate cancer screening       Relevant Orders   PSA, Medicare ( [place] only)   Long-term use of high-risk medication       Relevant Orders   CBC with Differential/Platelet   TSH       I spent a total of  30  minutes with this patient in a face to face visit on the date of this encounter reviewing the last office visit with me  and with [name] PD,   patient's diet and eating habits, home blood pressure readings ,  2013 imaging study of lumbar spine , and post visit ordering of testing and therapeutics.    Follow-up: No follow-ups on file.   [name], MD

## 2021-12-25 NOTE — Assessment & Plan Note (Addendum)
He ihs having frequent lows and eating cookies and ice cream on average 3 times per week.  He has gained weight.  Sliding scale novolog doses have been reduced to prevent recurrences .  Continue ozempic at 2 mg weekly

## 2021-12-26 ENCOUNTER — Telehealth: Payer: Self-pay

## 2021-12-26 NOTE — Progress Notes (Signed)
[name])                                            [name]    12/26/2021  Connor Ross [birth year] [id number]   Dr. [name],    [name] pharmacy case is being closed due to the following reasons:  The patient exceeds the income requirements for medication assistance for Jardiance.   Reason for referral: [name] is happy to assist the patient/family in the future for clinical pharmacy needs, following a discussion from your team about [name] outreach. Thank you for allowing [name] to be a part of your patient's care.    Thanks,  [name], [name] [phone]

## 2021-12-27 ENCOUNTER — Telehealth: Payer: Self-pay

## 2021-12-27 LAB — FRUCTOSAMINE: Fructosamine: 258 umol/L (ref 205–285)

## 2021-12-27 NOTE — Assessment & Plan Note (Addendum)
His pain is managed with tramadol and gabapentin , both at low doses.   Refill history confirmed via [place] Controlled Substance databas, accessed by me today..last refill in late April for 60 tablets.  Continued Weight loss advised.

## 2021-12-27 NOTE — Assessment & Plan Note (Signed)
Adjusted A1c using fructosamine conversion is 6.0   On current regimen

## 2021-12-27 NOTE — Assessment & Plan Note (Signed)
Tolerating the increaesd use of  crestor to daily dosing for goal LDL 70  Lab Results  Component Value Date   CHOL 142 12/21/2021   HDL 27.40 (L) 12/21/2021   LDLCALC 75 12/21/2021   LDLDIRECT 82.0 12/21/2021   TRIG 197.0 (H) 12/21/2021   CHOLHDL 5 12/21/2021

## 2021-12-27 NOTE — Telephone Encounter (Signed)
I called and spoke with patient to let him know that we had received his 4 boxes of Ozempic from patient assistance.

## 2021-12-27 NOTE — Assessment & Plan Note (Signed)
He has gained weight while taking Ozempic due to recurrent hypoglycemic resulting in  Increased sugar intake

## 2021-12-31 DIAGNOSIS — E1165 Type 2 diabetes mellitus with hyperglycemia: Secondary | ICD-10-CM | POA: Diagnosis not present

## 2022-01-01 ENCOUNTER — Telehealth: Payer: Self-pay

## 2022-01-01 NOTE — Telephone Encounter (Signed)
Pt picked up medication.

## 2022-01-01 NOTE — Telephone Encounter (Signed)
error 

## 2022-01-07 ENCOUNTER — Ambulatory Visit (INDEPENDENT_AMBULATORY_CARE_PROVIDER_SITE_OTHER): Payer: Medicare HMO

## 2022-01-07 VITALS — Ht 70.0 in | Wt 322.0 lb

## 2022-01-07 DIAGNOSIS — Z0000 Encounter for general adult medical examination without abnormal findings: Secondary | ICD-10-CM | POA: Diagnosis not present

## 2022-01-07 NOTE — Progress Notes (Addendum)
Subjective:   Connor Ross is a 56 y.o. male who presents for Medicare Annual/Subsequent preventive examination.  Review of Systems    No ROS.  Medicare Wellness Virtual Visit.  Visual/audio telehealth visit, UTA vital signs.   See social history for additional risk factors.   Cardiac Risk Factors include: advanced age (>58mn, >>42women)     Objective:    Today's Vitals   01/07/22 1301  Weight: (!) 322 lb (146.1 kg)  Height: 5' 10" (1.778 m)   Body mass index is 46.2 kg/m.     01/07/2022    1:08 PM 01/03/2021    2:18 PM 01/03/2020    2:24 PM  Advanced Directives  Does Patient Have a Medical Advance Directive? No No No  Would patient like information on creating a medical advance directive? No - Patient declined No - Patient declined No - Guardian declined    Current Medications (verified) Outpatient Encounter Medications as of 01/07/2022  Medication Sig   amLODipine (NORVASC) 2.5 MG tablet TAKE 1 TABLET EVERY DAY   ASPIRIN LOW DOSE 81 MG EC tablet TAKE 1 TABLET BY MOUTH  DAILY (SWALLOW WHOLE)   blood glucose meter kit and supplies Dispense based on patient and insurance preference. Use up to four times daily as directed. (FOR ICD-10 E10.9, E11.9). Use to check blood sugar three times daily   Continuous Blood Gluc Sensor (FREESTYLE LIBRE 2 SENSOR) MISC Use to check sugar at least 4 times daily   COVID-19 At Home Antigen Test ([facility]) KIT Use as directed.   fluticasone (FLONASE) 50 MCG/ACT nasal spray PLACE 2 SPRAYS INTO BOTH NOSTRILS DAILY. AT NIGHT AFTER NASAL SALINE   furosemide (LASIX) 20 MG tablet TAKE 1 TABLET (20 MG TOTAL) BY MOUTH EVERY OTHER DAY.   gabapentin (NEURONTIN) 100 MG capsule TAKE 1 CAPSULE THREE TIMES DAILY   insulin aspart (NOVOLOG FLEXPEN) 100 UNIT/ML FlexPen Inject up to 25 units with breakfast, 25 units with lunch, and 30 units with supper   insulin degludec (TRESIBA FLEXTOUCH) 200 UNIT/ML FlexTouch Pen Inject 88 Units  into the skin daily. (Patient taking differently: Inject 86 Units into the skin daily.)   Insulin Pen Needle 32G X 6 MM MISC Use daily with insulin pen   JARDIANCE 25 MG TABS tablet TAKE 1 TABLET EVERY DAY   Lancets (FREESTYLE) lancets 1 each by Other route 2 (two) times daily. DX 250.02   losartan (COZAAR) 100 MG tablet TAKE 1 TABLET (100 MG TOTAL) BY MOUTH DAILY.   ONETOUCH VERIO test strip USE TO CHECK BLOOD SUGAR 3  TIMES DAILY   rosuvastatin (CRESTOR) 10 MG tablet TAKE 1 TABLET EVERY DAY   Semaglutide, 2 MG/DOSE, (OZEMPIC, 2 MG/DOSE,) 8 MG/3ML SOPN Inject 2 mg into the skin once a week.   sildenafil (VIAGRA) 50 MG tablet Take 1 tablet (50 mg total) by mouth daily as needed for erectile dysfunction.   tamsulosin (FLOMAX) 0.4 MG CAPS capsule TAKE 1 CAPSULE EVERY DAY   traMADol (ULTRAM) 50 MG tablet Take 1 tablet (50 mg total) by mouth every 12 (twelve) hours as needed.   No facility-administered encounter medications on file as of 01/07/2022.    Allergies (verified) Hctz [hydrochlorothiazide] and Metformin and related   History: Past Medical History:  Diagnosis Date   Diabetes mellitus    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 type 2 diabetes mellitus, [place] to breakdown of skin ([place] 04/23/2019   Fracture of one rib, left  side, subsequent encounter for fracture with routine healing 08/27/2020   Hyperlipidemia    Hypertension    Normal cardiac stress test 2012   [facility]   Past Surgical History:  Procedure Laterality Date   EXOSTECTOMY Right 06/2019   5th MT head  ([facility])   LUMBAR DISC SURGERY     Family History  Problem Relation Age of Onset   Coronary artery disease Mother 36   COPD Mother    Heart disease Mother    Hyperlipidemia Mother    Hypertension Mother    Cancer Father    Hyperlipidemia Father    Hypertension Brother    Cancer Brother        lymphoma   Diabetes Maternal Aunt    Diabetes Maternal Uncle    Diabetes Maternal Grandmother     Heart disease Maternal Grandmother    Social History   Socioeconomic History   Marital status: Married    Spouse name: Not on file   Number of children: Not on file   Years of education: Not on file   Highest education level: Not on file  Occupational History   Not on file  Tobacco Use   Smoking status: Never   Smokeless tobacco: Never  Substance and Sexual Activity   Alcohol use: No   Drug use: No   Sexual activity: Not on file  Other Topics Concern   Not on file  Social History Narrative   Not on file   Social Determinants of Health   Financial Resource Strain: Low Risk  (01/07/2022)   Overall Financial Resource Strain (CARDIA)    Difficulty of Paying Living Expenses: Not very hard  Food Insecurity: No Food Insecurity (01/07/2022)   Hunger Vital Sign    Worried About Running Out of Food in the Last Year: Never true    Ran Out of Food in the Last Year: Never true  Transportation Needs: No Transportation Needs (01/07/2022)   PRAPARE - Hydrologist (Medical): No    Lack of Transportation (Non-Medical): No  Physical Activity: Insufficiently Active (01/07/2022)   Exercise Vital Sign    Days of Exercise per Week: 2 days    Minutes of Exercise per Session: 60 min  Stress: No Stress Concern Present (01/07/2022)   [place]    Feeling of Stress : Not at all  Social Connections: Moderately Integrated (01/07/2022)   Social Connection and Isolation Panel [NHANES]    Frequency of Communication with Friends and Family: More than three times a week    Frequency of Social Gatherings with Friends and Family: Patient refused    Attends Religious Services: 1 to 4 times per year    Active Member of [employer] or Organizations: No    Attends Music therapist: Not on file    Marital Status: Married    Tobacco Counseling Counseling given: Not Answered   Clinical Intake:  Pre-visit  preparation completed: Yes        Diabetes: Yes (Followed by PCP)  How often do you need to have someone help you when you read instructions, pamphlets, or other written materials from your doctor or pharmacy?: 1 - Never  Interpreter Needed?: No      Activities of Daily Living    01/07/2022    1:09 PM  In your present state of health, do you have any difficulty performing the following activities:  Hearing? 0  Vision? 0  Difficulty  concentrating or making decisions? 0  Walking or climbing stairs? 0  Dressing or bathing? 0  Doing errands, shopping? 0  Preparing Food and eating ? N  Using the Toilet? N  In the past six months, have you accidently leaked urine? N  Do you have problems with loss of bowel control? N  Managing your Medications? N  Managing your Finances? N  Housekeeping or managing your Housekeeping? N    Patient Care Team: [name], MD as PCP - General (Internal Medicine)  Indicate any recent Medical Services you may have received from other than [facility] providers in the past year (date may be approximate).     Assessment:   This is a routine wellness examination for [place].  Virtual Visit via Telephone Note  I connected with  [name] on 01/07/22 at  1:00 PM EDT by telephone and verified that I am speaking with the correct person using two identifiers.  Persons participating in the virtual visit: patient/Nurse Health Advisor   I discussed the limitations of performing an evaluation and management service by telehealth. We continued and completed visit with audio only. Some vital signs may be absent or patient reported.   Hearing/Vision screen Hearing Screening - Comments:: Patient has difficulty hearing conversational tones in a crowd. Audiology deferred in the last year per patient preference. Vision Screening - Comments:: Followed by Wears corrective lenses Cataract extraction, bilateral  They have seen their ophthalmologist in  the last 12 months.  Dietary issues and exercise activities discussed: Current Exercise Habits: Home exercise routine, Intensity: Mild Regular diet   Goals Addressed             This Visit's Progress    Increase physical activity       Healthy diet Stay hydrated Stay active       Depression Screen    01/07/2022    1:09 PM 12/25/2021    3:04 PM 09/11/2021    3:03 PM 03/12/2021    2:57 PM 01/03/2021    2:14 PM 08/25/2020    3:01 PM 01/03/2020    2:18 PM  PHQ 2/9 Scores  PHQ - 2 Score 0 0 0 0 0 0 0    Fall Risk    01/07/2022    1:17 PM 12/25/2021    3:03 PM 09/11/2021    3:03 PM 03/12/2021    2:56 PM 01/03/2021    2:23 PM  Fall Risk   Falls in the past year? 0 0 _0 Number falls in past yr: 0  1 1   Injury with Fall?   1 1   Risk for fall due to :  No Fall Risks History of fall(s) History of fall(s)   Follow up _1     FALL RISK PREVENTION PERTAINING TO THE HOME: Home free of loose throw rugs in walkways, pet beds, electrical cords, etc? Yes  Adequate lighting in your home to reduce risk of falls? Yes   ASSISTIVE DEVICES UTILIZED TO PREVENT FALLS: Life alert? No  Use of a cane, walker or w/c? Walking stick as needed.  Grab bars in the bathroom? Yes   TIMED UP AND GO: Was the test performed? No .   Cognitive Function:  Patient is alert and oriented x3.       01/03/2021    2:32 PM 01/03/2020    2:23 PM  6CIT Screen  What Year?  0 points 0 points  What month? 0 points 0 points  Months in reverse 0 points 0 points  Repeat phrase 0 points 0 points    Immunizations Immunization History  Administered Date(s) Administered   Tdap 10/16/2011, 09/11/2021   Screening Tests Health Maintenance  Topic Date Due   HIV Screening  02/12/2022 (Originally 03/07/1981)   INFLUENZA VACCINE  02/12/2022   FOOT EXAM  03/12/2022   HEMOGLOBIN A1C   06/22/2022   OPHTHALMOLOGY EXAM  06/22/2022   Fecal DNA (Cologuard)  04/19/2024   TETANUS/TDAP  09/12/2031   Hepatitis C Screening  Completed   HPV VACCINES  Aged Out   COVID-19 Vaccine  Discontinued   Zoster Vaccines- Shingrix  Discontinued   Health Maintenance There are no preventive care reminders to display for this patient.  Lung Cancer Screening: (Low Dose CT Chest recommended if Age 35-80 years, 30 pack-year currently smoking OR have quit w/in 15years.) does not qualify.   HIV Screening: labs followed by PCP. Deferred.   Vision Screening: Recommended annual ophthalmology exams for early detection of glaucoma and other disorders of the eye.  Dental Screening: Recommended annual dental exams for proper oral hygiene  Community Resource Referral / Chronic Care Management: CRR required this visit?  No   CCM required this visit?  No      Plan:   Keep all routine maintenance appointments.   I have personally reviewed and noted the following in the patient's chart:   Medical and social history Use of alcohol, tobacco or illicit drugs  Current medications and supplements including opioid prescriptions. Patient is currently taking opioid prescriptions. Information provided to patient regarding non-opioid alternatives. Patient advised to discuss non-opioid treatment plan with their provider. Taking Tramadol, followed by PCP.  Functional ability and status Nutritional status Physical activity Advanced directives List of other physicians Hospitalizations, surgeries, and ER visits in previous 12 months Vitals Screenings to include cognitive, depression, and falls Referrals and appointments  In addition, I have reviewed and discussed with patient certain preventive protocols, quality metrics, and best practice recommendations. A written personalized care plan for preventive services as well as general preventive health recommendations were provided to patient.      OBrien-Blaney, Denisa L, LPN   03/12/5620      I have reviewed the above information and agree with above.   Deborra Medina, MD

## 2022-01-07 NOTE — Patient Instructions (Addendum)
Connor Ross , Thank you for taking time to come for your Medicare Wellness Visit. I appreciate your ongoing commitment to your health goals. Please review the following plan we discussed and let me know if I can assist you in the future.   These are the goals we discussed:  Goals      Increase physical activity     Healthy diet Stay hydrated Stay active        This is a list of the screening recommended for you and due dates:  Health Maintenance  Topic Date Due   HIV Screening  02/12/2022*   Flu Shot  02/12/2022   Complete foot exam   03/12/2022   Hemoglobin A1C  06/22/2022   Eye exam for diabetics  06/22/2022   Cologuard (Stool DNA test)  04/19/2024   Tetanus Vaccine  09/12/2031   Hepatitis C Screening: USPSTF Recommendation to screen - Ages 18-79 yo.  Completed   HPV Vaccine  Aged Out   COVID-19 Vaccine  Discontinued   Zoster (Shingles) Vaccine  Discontinued  *Topic was postponed. The date shown is not the original due date.    Opioid Pain Medicine Management Opioids are powerful medicines that are used to treat moderate to severe pain. When used for short periods of time, they can help you to: Sleep better. Do better in physical or occupational therapy. Feel better in the first few days after an injury. Recover from surgery. Opioids should be taken with the supervision of a trained health care provider. They should be taken for the shortest period of time possible. This is because opioids can be addictive, and the longer you take opioids, the greater your risk of addiction. This addiction can also be called opioid use disorder. What are the risks? Using opioid pain medicines for longer than 3 days increases your risk of side effects. Side effects include: Constipation. Nausea and vomiting. Breathing difficulties (respiratory depression). Drowsiness. Confusion. Opioid use disorder. Itching. Taking opioid pain medicine for a long period of time can affect your  ability to do daily tasks. It also puts you at risk for: Motor vehicle crashes. Depression. Suicide. Heart attack. Overdose, which can be life-threatening. What is a pain treatment plan? A pain treatment plan is an agreement between you and your health care provider. Pain is unique to each person, and treatments vary depending on your condition. To manage your pain, you and your health care provider need to work together. To help you do this: Discuss the goals of your treatment, including how much pain you might expect to have and how you will manage the pain. Review the risks and benefits of taking opioid medicines. Remember that a good treatment plan uses more than one approach and minimizes the chance of side effects. Be honest about the amount of medicines you take and about any drug or alcohol use. Get pain medicine prescriptions from only one health care provider. Pain can be managed with many types of alternative treatments. Ask your health care provider to refer you to one or more specialists who can help you manage pain through: Physical or occupational therapy. Counseling (cognitive behavioral therapy). Good nutrition. Biofeedback. Massage. Meditation. Non-opioid medicine. Following a gentle exercise program. How to use opioid pain medicine Taking medicine Take your pain medicine exactly as told by your health care provider. Take it only when you need it. If your pain gets less severe, you may take less than your prescribed dose if your health care provider approves. If you  are not having pain, do nottake pain medicine unless your health care provider tells you to take it. If your pain is severe, do nottry to treat it yourself by taking more pills than instructed on your prescription. Contact your health care provider for help. Write down the times when you take your pain medicine. It is easy to become confused while on pain medicine. Writing the time can help you avoid  overdose. Take other over-the-counter or prescription medicines only as told by your health care provider. Keeping yourself and others safe  While you are taking opioid pain medicine: Do not drive, use machinery, or power tools. Do not sign legal documents. Do not drink alcohol. Do not take sleeping pills. Do not supervise children by yourself. Do not do activities that require climbing or being in high places. Do not go to a lake, river, ocean, spa, or swimming pool. Do not share your pain medicine with anyone. Keep pain medicine in a locked cabinet or in a secure area where pets and children cannot reach it. Stopping your use of opioids If you have been taking opioid medicine for more than a few weeks, you may need to slowly decrease (taper) how much you take until you stop completely. Tapering your use of opioids can decrease your risk of symptoms of withdrawal, such as: Pain and cramping in the abdomen. Nausea. Sweating. Sleepiness. Restlessness. Uncontrollable shaking (tremors). Cravings for the medicine. Do not attempt to taper your use of opioids on your own. Talk with your health care provider about how to do this. Your health care provider may prescribe a step-down schedule based on how much medicine you are taking and how long you have been taking it. Getting rid of leftover pills Do not save any leftover pills. Get rid of leftover pills safely by: Taking the medicine to a prescription take-back program. This is usually offered by the county or law enforcement. Bringing them to a pharmacy that has a drug disposal container. Flushing them down the toilet. Check the label or package insert of your medicine to see whether this is safe to do. Throwing them out in the trash. Check the label or package insert of your medicine to see whether this is safe to do. If it is safe to throw it out, remove the medicine from the original container, put it into a sealable bag or container, and  mix it with used coffee grounds, food scraps, dirt, or cat litter before putting it in the trash. Follow these instructions at home: Activity Do exercises as told by your health care provider. Avoid activities that make your pain worse. Return to your normal activities as told by your health care provider. Ask your health care provider what activities are safe for you. General instructions You may need to take these actions to prevent or treat constipation: Drink enough fluid to keep your urine pale yellow. Take over-the-counter or prescription medicines. Eat foods that are high in fiber, such as beans, whole grains, and fresh fruits and vegetables. Limit foods that are high in fat and processed sugars, such as fried or sweet foods. Keep all follow-up visits. This is important. Where to find support If you have been taking opioids for a long time, you may benefit from receiving support for quitting from a local support group or counselor. Ask your health care provider for a referral to these resources in your area. Where to find more information Centers for Disease Control and Prevention (CDC): FootballExhibition.com.br [place] Food and  Drug Administration (FDA): PumpkinSearch.com.ee Get help right away if: You may have taken too much of an opioid (overdosed). Common symptoms of an overdose: Your breathing is slower or more shallow than normal. You have a very slow heartbeat (pulse). You have slurred speech. You have nausea and vomiting. Your pupils become very small. You have other potential symptoms: You are very confused. You faint or feel like you will faint. You have cold, clammy skin. You have blue lips or fingernails. You have thoughts of harming yourself or harming others. These symptoms may represent a serious problem that is an emergency. Do not wait to see if the symptoms will go away. Get medical help right away. Call your local emergency services (911 in the [place] Do not drive yourself to the  hospital.  If you ever feel like you may hurt yourself or others, or have thoughts about taking your own life, get help right away. Go to your nearest emergency department or: Call your local emergency services (911 in the [place] Call the [facility] (8723133776 in the [place] Call a suicide crisis helpline, such as the National Suicide Prevention Lifeline at 423 118 0573 or 988 in the [place] This is open 24 hours a day in the [place] Text the Crisis Text Line at (706)270-2287 (in the [place] Summary Opioid medicines can help you manage moderate to severe pain for a short period of time. A pain treatment plan is an agreement between you and your health care provider. Discuss the goals of your treatment, including how much pain you might expect to have and how you will manage the pain. If you think that you or someone else may have taken too much of an opioid, get medical help right away. This information is not intended to replace advice given to you by your health care provider. Make sure you discuss any questions you have with your health care provider. Document Revised: 01/24/2021 Document Reviewed: 10/11/2020 Elsevier Patient Education  2023 [employer].

## 2022-01-09 ENCOUNTER — Other Ambulatory Visit: Payer: Self-pay | Admitting: Internal Medicine

## 2022-01-31 DIAGNOSIS — E1165 Type 2 diabetes mellitus with hyperglycemia: Secondary | ICD-10-CM | POA: Diagnosis not present

## 2022-02-11 ENCOUNTER — Other Ambulatory Visit: Payer: Self-pay | Admitting: Internal Medicine

## 2022-02-11 DIAGNOSIS — I152 Hypertension secondary to endocrine disorders: Secondary | ICD-10-CM

## 2022-02-26 ENCOUNTER — Telehealth: Payer: Self-pay

## 2022-02-26 NOTE — Telephone Encounter (Signed)
LMTCB. Need to let pt know that we have received his patient assistance medications in the office.    Novolog: 8 boxes Tresiba: 6 boxes Pen Needles: 5 boxes

## 2022-02-26 NOTE — Telephone Encounter (Signed)
noted 

## 2022-02-26 NOTE — Telephone Encounter (Signed)
The patient was made aware that his medication was here. He stated he will be by tomorrow to pick up.

## 2022-02-27 DIAGNOSIS — L2089 Other atopic dermatitis: Secondary | ICD-10-CM | POA: Diagnosis not present

## 2022-02-27 DIAGNOSIS — L309 Dermatitis, unspecified: Secondary | ICD-10-CM | POA: Diagnosis not present

## 2022-02-27 DIAGNOSIS — L570 Actinic keratosis: Secondary | ICD-10-CM | POA: Diagnosis not present

## 2022-03-03 DIAGNOSIS — E1165 Type 2 diabetes mellitus with hyperglycemia: Secondary | ICD-10-CM | POA: Diagnosis not present

## 2022-03-14 ENCOUNTER — Telehealth: Payer: Self-pay

## 2022-03-14 NOTE — Telephone Encounter (Signed)
Received pt assistance medication for pt. Pt is aware that medication is here and ready for pick up.   Novolog: 4 boxes

## 2022-03-20 ENCOUNTER — Telehealth: Payer: Self-pay

## 2022-03-20 NOTE — Telephone Encounter (Signed)
Patient came into office to pick up his Novolog insulin from Patient Assistance on 03/20/22 @ 3:45

## 2022-03-23 ENCOUNTER — Encounter: Payer: Self-pay | Admitting: Internal Medicine

## 2022-03-25 NOTE — Telephone Encounter (Signed)
Medication Samples have been provided to the patient.  Drug name: Jardiance       Strength: 25 mg        Qty: 4 boxes  LOT: [id number]  Exp.Date: 07/2023  Dosing instructions: Take tablet daily.   The patient has been instructed regarding the correct time, dose, and frequency of taking this medication, including desired effects and most common side effects.   Shelagh Rayman 10:11 AM 03/25/2022

## 2022-03-27 ENCOUNTER — Other Ambulatory Visit (INDEPENDENT_AMBULATORY_CARE_PROVIDER_SITE_OTHER): Payer: Medicare HMO

## 2022-03-27 DIAGNOSIS — E669 Obesity, unspecified: Secondary | ICD-10-CM

## 2022-03-27 DIAGNOSIS — Z79899 Other long term (current) drug therapy: Secondary | ICD-10-CM

## 2022-03-27 DIAGNOSIS — I152 Hypertension secondary to endocrine disorders: Secondary | ICD-10-CM

## 2022-03-27 DIAGNOSIS — E1169 Type 2 diabetes mellitus with other specified complication: Secondary | ICD-10-CM

## 2022-03-27 DIAGNOSIS — Z125 Encounter for screening for malignant neoplasm of prostate: Secondary | ICD-10-CM | POA: Diagnosis not present

## 2022-03-27 DIAGNOSIS — E785 Hyperlipidemia, unspecified: Secondary | ICD-10-CM

## 2022-03-27 DIAGNOSIS — E1159 Type 2 diabetes mellitus with other circulatory complications: Secondary | ICD-10-CM

## 2022-03-27 DIAGNOSIS — E119 Type 2 diabetes mellitus without complications: Secondary | ICD-10-CM

## 2022-03-27 LAB — COMPREHENSIVE METABOLIC PANEL
ALT: 28 U/L (ref 0–53)
AST: 12 U/L (ref 0–37)
Albumin: 4 g/dL (ref 3.5–5.2)
Alkaline Phosphatase: 73 U/L (ref 39–117)
BUN: 12 mg/dL (ref 6–23)
CO2: 25 mEq/L (ref 19–32)
Calcium: 9.1 mg/dL (ref 8.4–10.5)
Chloride: 104 mEq/L (ref 96–112)
Creatinine, Ser: 0.63 mg/dL (ref 0.40–1.50)
GFR: 105.18 mL/min (ref >60.00)
Glucose, Bld: 87 mg/dL (ref 70–99)
Potassium: 3.8 mEq/L (ref 3.5–5.1)
Sodium: 139 mEq/L (ref 135–145)
Total Bilirubin: 0.3 mg/dL (ref 0.2–1.2)
Total Protein: 6.9 g/dL (ref 6.0–8.3)

## 2022-03-27 LAB — CBC WITH DIFFERENTIAL/PLATELET
Basophils Absolute: 0 10*3/uL (ref 0.0–0.1)
Basophils Relative: 0.6 % (ref 0.0–3.0)
Eosinophils Absolute: 0.2 10*3/uL (ref 0.0–0.7)
Eosinophils Relative: 2.5 % (ref 0.0–5.0)
HCT: 44.8 % (ref 39.0–52.0)
Hemoglobin: 15.1 g/dL (ref 13.0–17.0)
Lymphocytes Relative: 22.2 % (ref 12.0–46.0)
Lymphs Abs: 1.8 10*3/uL (ref 0.7–4.0)
MCHC: 33.8 g/dL (ref 30.0–36.0)
MCV: 91.1 fl (ref 78.0–100.0)
Monocytes Absolute: 0.7 10*3/uL (ref 0.1–1.0)
Monocytes Relative: 8.5 % (ref 3.0–12.0)
Neutro Abs: 5.4 10*3/uL (ref 1.4–7.7)
Neutrophils Relative %: 66.2 % (ref 43.0–77.0)
Platelets: 222 10*3/uL (ref 150.0–400.0)
RBC: 4.92 Mil/uL (ref 4.22–5.81)
RDW: 14.5 % (ref 11.5–15.5)
WBC: 8.1 10*3/uL (ref 4.0–10.5)

## 2022-03-27 LAB — LIPID PANEL
Cholesterol: 165 mg/dL (ref 0–200)
HDL: 35.5 mg/dL — ABNORMAL LOW (ref >39.00)
LDL Cholesterol: 99 mg/dL (ref 0–99)
NonHDL: 129.18
Total CHOL/HDL Ratio: 5
Triglycerides: 150 mg/dL — ABNORMAL HIGH (ref 0.0–149.0)
VLDL: 30 mg/dL (ref 0.0–40.0)

## 2022-03-27 LAB — TSH: TSH: 2.29 u[IU]/mL (ref 0.35–5.50)

## 2022-03-27 LAB — PSA, MEDICARE: PSA: 0.47 ng/ml (ref 0.10–4.00)

## 2022-03-27 LAB — LDL CHOLESTEROL, DIRECT: Direct LDL: 106 mg/dL

## 2022-03-27 LAB — HEMOGLOBIN A1C: Hgb A1c MFr Bld: 8 % — ABNORMAL HIGH (ref 4.6–6.5)

## 2022-03-31 LAB — FRUCTOSAMINE: Fructosamine: 262 umol/L (ref 205–285)

## 2022-04-01 DIAGNOSIS — H61001 Unspecified perichondritis of right external ear: Secondary | ICD-10-CM | POA: Diagnosis not present

## 2022-04-01 DIAGNOSIS — B353 Tinea pedis: Secondary | ICD-10-CM | POA: Diagnosis not present

## 2022-04-01 DIAGNOSIS — I872 Venous insufficiency (chronic) (peripheral): Secondary | ICD-10-CM | POA: Diagnosis not present

## 2022-04-01 DIAGNOSIS — L2089 Other atopic dermatitis: Secondary | ICD-10-CM | POA: Diagnosis not present

## 2022-04-03 ENCOUNTER — Encounter: Payer: Self-pay | Admitting: Internal Medicine

## 2022-04-03 ENCOUNTER — Ambulatory Visit (INDEPENDENT_AMBULATORY_CARE_PROVIDER_SITE_OTHER): Payer: Medicare HMO | Admitting: Internal Medicine

## 2022-04-03 ENCOUNTER — Telehealth: Payer: Self-pay

## 2022-04-03 VITALS — BP 130/68 | HR 90 | Ht 70.0 in | Wt 319.4 lb

## 2022-04-03 DIAGNOSIS — E669 Obesity, unspecified: Secondary | ICD-10-CM

## 2022-04-03 DIAGNOSIS — I152 Hypertension secondary to endocrine disorders: Secondary | ICD-10-CM

## 2022-04-03 DIAGNOSIS — E1169 Type 2 diabetes mellitus with other specified complication: Secondary | ICD-10-CM | POA: Diagnosis not present

## 2022-04-03 DIAGNOSIS — E785 Hyperlipidemia, unspecified: Secondary | ICD-10-CM | POA: Diagnosis not present

## 2022-04-03 DIAGNOSIS — M5416 Radiculopathy, lumbar region: Secondary | ICD-10-CM | POA: Diagnosis not present

## 2022-04-03 DIAGNOSIS — E1159 Type 2 diabetes mellitus with other circulatory complications: Secondary | ICD-10-CM

## 2022-04-03 DIAGNOSIS — M48061 Spinal stenosis, lumbar region without neurogenic claudication: Secondary | ICD-10-CM

## 2022-04-03 DIAGNOSIS — E1165 Type 2 diabetes mellitus with hyperglycemia: Secondary | ICD-10-CM | POA: Diagnosis not present

## 2022-04-03 DIAGNOSIS — I872 Venous insufficiency (chronic) (peripheral): Secondary | ICD-10-CM | POA: Diagnosis not present

## 2022-04-03 MED ORDER — GABAPENTIN 100 MG PO CAPS: 200.0000 mg | ORAL_CAPSULE | Freq: Three times a day (TID) | ORAL | 2 refills | Status: DC

## 2022-04-03 NOTE — Telephone Encounter (Signed)
Medication Samples have been provided to the patient.  Drug name: Jardiance       Strength: 25 mg        Qty: 4 boxes  LOT: [id number]  Exp.Date: 07/2023  Dosing instructions: Take 1 tablet by mouth daily.   The patient has been instructed regarding the correct time, dose, and frequency of taking this medication, including desired effects and most common side effects.   Miraya Cudney 3:55 PM 04/03/2022

## 2022-04-03 NOTE — Assessment & Plan Note (Signed)
Treated by Dr Phillip Heal with unknown cream. He has ordered compression stocking

## 2022-04-03 NOTE — Progress Notes (Signed)
Subjective:  Patient ID: Connor Ross, male    DOB: October 30, 1965  Age: 56 y.o. MRN: [id number]  CC: The primary encounter diagnosis was Obesity, diabetes, and hypertension syndrome ([place]). Diagnoses of Hyperlipidemia with target LDL less than 70, Venous stasis dermatitis of left lower extremity, Spinal stenosis of lumbar region with radiculopathy, and Hypertension associated with diabetes ([place]) were also pertinent to this visit.   HPI Connor Ross presents for follow up on type 2 DM   Type 2 DM:  At last visit he reported having frequent lows and resorted to eating cookies and ice cream on average 3 times per week, resulting in weight gain.  Sliding scale novolog doses were reduced to prevent recurrences and he was advised to Continue ozempic at 2 mg weekly . Review of downloaded data  suggested  that he is above goal  37% of the time.  He notes that he has been noncompliant with dietary restrictions.  He concurs, has been eating halloween candy.  Current adjusted a1c using the fructosamine correlation is 6.0 , although the 2 weeks CBG download suggests 7.4 as he is out of range 37% of the time ,, all due to high blood sugars.  .  Snacks after dinner.  Using 20-30 units novolog  in the am.  Skips novolog if not having lunch.   Taking 60 units before dinner plus 20 if eating starchy meals..  Tyler Aas 88 units daily   Venous stasis dermatitis treated by dermatology during evaluation of hand can't remember the names of 3 creams prescribed  Dr Phillip Heal in [facility]     Outpatient Medications Prior to Visit  Medication Sig Dispense Refill   amLODipine (NORVASC) 2.5 MG tablet TAKE 1 TABLET EVERY DAY 90 tablet 3   ASPIRIN LOW DOSE 81 MG EC tablet TAKE 1 TABLET BY MOUTH  DAILY (SWALLOW WHOLE) 90 tablet 1   blood glucose meter kit and supplies Dispense based on patient and insurance preference. Use up to four times daily as directed. (FOR ICD-10 E10.9, E11.9). Use to check blood sugar three  times daily 1 each 0   clobetasol cream (TEMOVATE) 0.05 % Apply topically 2 (two) times daily.     Continuous Blood Gluc Sensor (FREESTYLE LIBRE 2 SENSOR) MISC Use to check sugar at least 4 times daily 1 each 2   fluticasone (FLONASE) 50 MCG/ACT nasal spray PLACE 2 SPRAYS INTO BOTH NOSTRILS DAILY. AT NIGHT AFTER NASAL SALINE 16 g 2   furosemide (LASIX) 20 MG tablet TAKE 1 TABLET (20 MG TOTAL) BY MOUTH EVERY OTHER DAY. 45 tablet 3   insulin aspart (NOVOLOG FLEXPEN) 100 UNIT/ML FlexPen Inject up to 25 units with breakfast, 25 units with lunch, and 30 units with supper 72 mL 2   insulin degludec (TRESIBA FLEXTOUCH) 200 UNIT/ML FlexTouch Pen Inject 88 Units into the skin daily. (Patient taking differently: Inject 86 Units into the skin daily.) 48 mL 2   Insulin Pen Needle 32G X 6 MM MISC Use daily with insulin pen 50 each 0   JARDIANCE 25 MG TABS tablet TAKE 1 TABLET EVERY DAY 90 tablet 3   ketoconazole (NIZORAL) 2 % cream SMARTSIG:1 Application Topical 1 to 2 Times Daily     Lancets (FREESTYLE) lancets 1 each by Other route 2 (two) times daily. DX 250.02 100 each 12   losartan (COZAAR) 100 MG tablet TAKE 1 TABLET (100 MG TOTAL) DAILY. 90 tablet 1   ONETOUCH VERIO test strip USE TO CHECK BLOOD SUGAR 3  TIMES DAILY 300 strip 3   rosuvastatin (CRESTOR) 10 MG tablet TAKE 1 TABLET EVERY DAY 90 tablet 3   Semaglutide, 2 MG/DOSE, (OZEMPIC, 2 MG/DOSE,) 8 MG/3ML SOPN Inject 2 mg into the skin once a week. 8 mL 2   sildenafil (VIAGRA) 50 MG tablet Take 1 tablet (50 mg total) by mouth daily as needed for erectile dysfunction. 10 tablet 5   tamsulosin (FLOMAX) 0.4 MG CAPS capsule TAKE 1 CAPSULE EVERY DAY 90 capsule 3   traMADol (ULTRAM) 50 MG tablet Take 1 tablet (50 mg total) by mouth every 12 (twelve) hours as needed. 60 tablet 5   triamcinolone (KENALOG) 0.025 % cream Apply topically.     COVID-19 At Home Antigen Test [facility] COVID-19 AG HOME TEST) KIT Use as directed. 8 kit 0   gabapentin (NEURONTIN) 100  MG capsule TAKE 1 CAPSULE THREE TIMES DAILY 270 capsule 3   No facility-administered medications prior to visit.    Review of Systems;  Patient denies headache, fevers, malaise, unintentional weight loss, skin rash, eye pain, sinus congestion and sinus pain, sore throat, dysphagia,  hemoptysis , cough, dyspnea, wheezing, chest pain, palpitations, orthopnea, edema, abdominal pain, nausea, melena, diarrhea, constipation, flank pain, dysuria, hematuria, urinary  Frequency, nocturia, numbness, tingling, seizures,  Focal weakness, Loss of consciousness,  Tremor, insomnia, depression, anxiety, and suicidal ideation.      Objective:  BP 130/68 (BP Location: Left Arm, Patient Position: Sitting, Cuff Size: Large)   Pulse 90   Ht _0  (1.778 m)   Wt (!) 319 lb 6.4 oz (144.9 kg)   SpO2 97%   BMI 45.83 kg/m   BP Readings from Last 3 Encounters:  04/03/22 130/68  12/25/21 138/70  09/27/21 (!) 148/84    Wt Readings from Last 3 Encounters:  04/03/22 (!) 319 lb 6.4 oz (144.9 kg)  01/07/22 (!) 322 lb (146.1 kg)  12/25/21 (!) 322 lb 12.8 oz (146.4 kg)    General appearance: alert, cooperative and appears stated age Ears: normal TM's and external ear canals both ears Throat: lips, mucosa, and tongue normal; teeth and gums normal Neck: no adenopathy, no carotid bruit, supple, symmetrical, trachea midline and thyroid not enlarged, symmetric, no tenderness/mass/nodules Back: symmetric, no curvature. ROM normal. No CVA tenderness. Lungs: clear to auscultation bilaterally Heart: regular rate and rhythm, S1, S2 normal, no murmur, click, rub or gallop Abdomen: soft, non-tender; bowel sounds normal; no masses,  no organomegaly Pulses: 2+ and symmetric Skin: Skin color, texture, turgor normal. No rashes or lesions Lymph nodes: Cervical, supraclavicular, and axillary nodes normal. Neuro:  awake and interactive with normal mood and affect. Higher cortical functions are normal. Speech is clear without  word-finding difficulty or dysarthria. Extraocular movements are intact. Visual fields of both eyes are grossly intact. Sensation to light touch is grossly intact bilaterally of upper and lower extremities. Motor examination shows 4+/5 symmetric hand grip and upper extremity and 5/5 lower extremity strength. There is no pronation or drift. Gait is non-ataxic   Lab Results  Component Value Date   HGBA1C 8.0 (H) 03/27/2022   HGBA1C 7.6 (H) 12/21/2021   HGBA1C 8.2 (H) 09/07/2021    Lab Results  Component Value Date   CREATININE 0.63 03/27/2022   CREATININE 0.62 12/21/2021   CREATININE 0.73 09/07/2021    Lab Results  Component Value Date   WBC 8.1 03/27/2022   HGB 15.1 03/27/2022   HCT 44.8 03/27/2022   PLT 222.0 03/27/2022   GLUCOSE 87 03/27/2022   CHOL  165 03/27/2022   TRIG 150.0 (H) 03/27/2022   HDL 35.50 (L) 03/27/2022   LDLDIRECT 106.0 03/27/2022   LDLCALC 99 03/27/2022   ALT 28 03/27/2022   AST 12 03/27/2022   NA 139 03/27/2022   K 3.8 03/27/2022   CL 104 03/27/2022   CREATININE 0.63 03/27/2022   BUN 12 03/27/2022   CO2 25 03/27/2022   TSH 2.29 03/27/2022   PSA 0.47 03/27/2022   HGBA1C 8.0 (H) 03/27/2022   MICROALBUR 14.5 (H) 12/21/2021    DG Foot Complete Left  Result Date: 04/29/2019 CLINICAL DATA:  56 year old male with a history of nonhealing wound EXAM: LEFT FOOT - COMPLETE 3+ VIEW COMPARISON:  04/15/2018, FINDINGS: No acute displaced fracture. Degenerative changes of the tibiotalar joint, subtalar joint, enthesopathic changes of Achilles insertion and plantar fascia insertion. Degenerative changes of the midfoot. Valgus deformity of the 2-4 metatarsophalangeal joints, with no dislocation. Degenerative changes of the interphalangeal joints. No erosive changes of the distal phalanx of the great toe. There is lucency associated with the dorsal aspect of the distal great toe with no radiopaque foreign body. IMPRESSION: No acute bony abnormality, with no erosive  changes of the great toe. Soft tissue changes on the dorsum of the great toe compatible with given history Degenerative changes of the foot Electronically Signed   By: Corrie Mckusick D.O.   On: 04/29/2019 08:38    Assessment & Plan:   Problem List Items Addressed This Visit     Venous stasis dermatitis of left lower extremity    Treated by Dr Phillip Heal with unknown cream. He has ordered compression stocking       Spinal stenosis of lumbar region with radiculopathy     His pain is managed with tramadol and gabapentin , both at low doses.   Refill history confirmed via  Controlled Substance databas, accessed by me today..      Obesity, diabetes, and hypertension syndrome ([place]) - Primary    He remains uncontrolled due to dietary indiscretions and irregular eating habits. Advised to take5 units of novolog at lunch even if not eating. And to check pre and post prandial dinner sugars        Relevant Orders   Comprehensive metabolic panel   Hemoglobin A1c   Fructosamine   Hypertension associated with diabetes ([place])    Well controlled on current regimen of amlodipine losartan and furosmide. Renal function stable, no changes today.  Lab Results  Component Value Date   CREATININE 0.63 03/27/2022   Lab Results  Component Value Date   NA 139 03/27/2022   K 3.8 03/27/2022   CL 104 03/27/2022   CO2 25 03/27/2022        Hyperlipidemia with target LDL less than 70   Relevant Orders   Lipid panel   LDL cholesterol, direct    I spent a total of   minutes with this patient in a face to face visit on the date of this encounter reviewing the last office visit with me in       ,  most recent visit with cardiology ,    ,  patient's diet and exercise habits, home blood pressure /blod sugar readings, recent ER visit including labs and imaging studies ,   and post visit ordering of testing and therapeutics.    Follow-up: Return in about 3 months (around 07/03/2022) for follow up  diabetes.   [name], MD

## 2022-04-03 NOTE — Patient Instructions (Addendum)
Your lunchtime sugars are spiking even when you don't eat   Your nighttime sugars (after dinner) are also high   Please add   5  units of insulin  at lunch time if not eating   Please send me "post dinner " and "post evening snack " sugars ( 2 hrs post) in 2 weeks    For the nerve pain:  You can increase gabapentin to  two capsules twice daily  ; I have increased the quantity to 540/month which allows you to increase again to  3 capsules twice daily if needed   Try using an ounce of pickle juice at bedtime for the cramps

## 2022-04-03 NOTE — Assessment & Plan Note (Addendum)
He remains uncontrolled due to dietary indiscretions and irregular eating habits. Advised to take 5 units of novolog at lunch even if not eating. And to check pre and post prandial dinner sugars  .  Continue ozempic and jardiance

## 2022-04-04 ENCOUNTER — Other Ambulatory Visit: Payer: Self-pay | Admitting: Internal Medicine

## 2022-04-04 ENCOUNTER — Telehealth: Payer: Self-pay

## 2022-04-04 DIAGNOSIS — M48061 Spinal stenosis, lumbar region without neurogenic claudication: Secondary | ICD-10-CM

## 2022-04-04 NOTE — Telephone Encounter (Signed)
Received pt's pt assistance medication in the office today. Pt is aware that it is ready for pick up.   Ozempic: 4 boxes

## 2022-04-05 ENCOUNTER — Telehealth: Payer: Self-pay

## 2022-04-05 ENCOUNTER — Other Ambulatory Visit: Payer: Self-pay

## 2022-04-05 MED ORDER — GABAPENTIN 100 MG PO CAPS: 200.0000 mg | ORAL_CAPSULE | Freq: Three times a day (TID) | ORAL | 0 refills | Status: DC

## 2022-04-05 NOTE — Telephone Encounter (Signed)
LMTCB to confirm patient wanted sent to [facility] in his chart.

## 2022-04-06 NOTE — Assessment & Plan Note (Signed)
Well controlled on current regimen of amlodipine losartan and furosmide. Renal function stable, no changes today.  Lab Results  Component Value Date   CREATININE 0.63 03/27/2022   Lab Results  Component Value Date   NA 139 03/27/2022   K 3.8 03/27/2022   CL 104 03/27/2022   CO2 25 03/27/2022

## 2022-04-06 NOTE — Progress Notes (Signed)
Subjective:  Patient ID: Connor Ross, male    DOB: October 30, 1965  Age: 56 y.o. MRN: [id number]  CC: The primary encounter diagnosis was Obesity, diabetes, and hypertension syndrome ([place]). Diagnoses of Hyperlipidemia with target LDL less than 70, Venous stasis dermatitis of left lower extremity, Spinal stenosis of lumbar region with radiculopathy, and Hypertension associated with diabetes ([place]) were also pertinent to this visit.   HPI Connor Ross presents for follow up on type 2 DM   Type 2 DM:  At last visit he reported having frequent lows and resorted to eating cookies and ice cream on average 3 times per week, resulting in weight gain.  Sliding scale novolog doses were reduced to prevent recurrences and he was advised to Continue ozempic at 2 mg weekly . Review of downloaded data  suggested  that he is above goal  37% of the time.  He notes that he has been noncompliant with dietary restrictions.  He concurs, has been eating halloween candy.  Current adjusted a1c using the fructosamine correlation is 6.0 , although the 2 weeks CBG download suggests 7.4 as he is out of range 37% of the time ,, all due to high blood sugars.  .  Snacks after dinner.  Using 20-30 units novolog  in the am.  Skips novolog if not having lunch.   Taking 60 units before dinner plus 20 if eating starchy meals..  Tyler Aas 88 units daily   Venous stasis dermatitis treated by dermatology during evaluation of hand can't remember the names of 3 creams prescribed  Dr Phillip Heal in [facility]     Outpatient Medications Prior to Visit  Medication Sig Dispense Refill   amLODipine (NORVASC) 2.5 MG tablet TAKE 1 TABLET EVERY DAY 90 tablet 3   ASPIRIN LOW DOSE 81 MG EC tablet TAKE 1 TABLET BY MOUTH  DAILY (SWALLOW WHOLE) 90 tablet 1   blood glucose meter kit and supplies Dispense based on patient and insurance preference. Use up to four times daily as directed. (FOR ICD-10 E10.9, E11.9). Use to check blood sugar three  times daily 1 each 0   clobetasol cream (TEMOVATE) 0.05 % Apply topically 2 (two) times daily.     Continuous Blood Gluc Sensor (FREESTYLE LIBRE 2 SENSOR) MISC Use to check sugar at least 4 times daily 1 each 2   fluticasone (FLONASE) 50 MCG/ACT nasal spray PLACE 2 SPRAYS INTO BOTH NOSTRILS DAILY. AT NIGHT AFTER NASAL SALINE 16 g 2   furosemide (LASIX) 20 MG tablet TAKE 1 TABLET (20 MG TOTAL) BY MOUTH EVERY OTHER DAY. 45 tablet 3   insulin aspart (NOVOLOG FLEXPEN) 100 UNIT/ML FlexPen Inject up to 25 units with breakfast, 25 units with lunch, and 30 units with supper 72 mL 2   insulin degludec (TRESIBA FLEXTOUCH) 200 UNIT/ML FlexTouch Pen Inject 88 Units into the skin daily. (Patient taking differently: Inject 86 Units into the skin daily.) 48 mL 2   Insulin Pen Needle 32G X 6 MM MISC Use daily with insulin pen 50 each 0   JARDIANCE 25 MG TABS tablet TAKE 1 TABLET EVERY DAY 90 tablet 3   ketoconazole (NIZORAL) 2 % cream SMARTSIG:1 Application Topical 1 to 2 Times Daily     Lancets (FREESTYLE) lancets 1 each by Other route 2 (two) times daily. DX 250.02 100 each 12   losartan (COZAAR) 100 MG tablet TAKE 1 TABLET (100 MG TOTAL) DAILY. 90 tablet 1   ONETOUCH VERIO test strip USE TO CHECK BLOOD SUGAR 3  TIMES DAILY 300 strip 3   rosuvastatin (CRESTOR) 10 MG tablet TAKE 1 TABLET EVERY DAY 90 tablet 3   Semaglutide, 2 MG/DOSE, (OZEMPIC, 2 MG/DOSE,) 8 MG/3ML SOPN Inject 2 mg into the skin once a week. 8 mL 2   sildenafil (VIAGRA) 50 MG tablet Take 1 tablet (50 mg total) by mouth daily as needed for erectile dysfunction. 10 tablet 5   tamsulosin (FLOMAX) 0.4 MG CAPS capsule TAKE 1 CAPSULE EVERY DAY 90 capsule 3   traMADol (ULTRAM) 50 MG tablet Take 1 tablet (50 mg total) by mouth every 12 (twelve) hours as needed. 60 tablet 5   triamcinolone (KENALOG) 0.025 % cream Apply topically.     COVID-19 At Home Antigen Test [facility] COVID-19 AG HOME TEST) KIT Use as directed. 8 kit 0   gabapentin (NEURONTIN) 100  MG capsule TAKE 1 CAPSULE THREE TIMES DAILY 270 capsule 3   No facility-administered medications prior to visit.    Review of Systems;  Patient denies headache, fevers, malaise, unintentional weight loss, skin rash, eye pain, sinus congestion and sinus pain, sore throat, dysphagia,  hemoptysis , cough, dyspnea, wheezing, chest pain, palpitations, orthopnea, edema, abdominal pain, nausea, melena, diarrhea, constipation, flank pain, dysuria, hematuria, urinary  Frequency, nocturia, numbness, tingling, seizures,  Focal weakness, Loss of consciousness,  Tremor, insomnia, depression, anxiety, and suicidal ideation.      Objective:  BP 130/68 (BP Location: Left Arm, Patient Position: Sitting, Cuff Size: Large)   Pulse 90   Ht _0  (1.778 m)   Wt (!) 319 lb 6.4 oz (144.9 kg)   SpO2 97%   BMI 45.83 kg/m   BP Readings from Last 3 Encounters:  04/03/22 130/68  12/25/21 138/70  09/27/21 (!) 148/84    Wt Readings from Last 3 Encounters:  04/03/22 (!) 319 lb 6.4 oz (144.9 kg)  01/07/22 (!) 322 lb (146.1 kg)  12/25/21 (!) 322 lb 12.8 oz (146.4 kg)    General appearance: alert, cooperative and appears stated age Ears: normal TM's and external ear canals both ears Throat: lips, mucosa, and tongue normal; teeth and gums normal Neck: no adenopathy, no carotid bruit, supple, symmetrical, trachea midline and thyroid not enlarged, symmetric, no tenderness/mass/nodules Back: symmetric, no curvature. ROM normal. No CVA tenderness. Lungs: clear to auscultation bilaterally Heart: regular rate and rhythm, S1, S2 normal, no murmur, click, rub or gallop Abdomen: soft, non-tender; bowel sounds normal; no masses,  no organomegaly Pulses: 2+ and symmetric Skin: Skin color, texture, turgor normal. No rashes or lesions Lymph nodes: Cervical, supraclavicular, and axillary nodes normal. Neuro:  awake and interactive with normal mood and affect. Higher cortical functions are normal. Speech is clear without  word-finding difficulty or dysarthria. Extraocular movements are intact. Visual fields of both eyes are grossly intact. Sensation to light touch is grossly intact bilaterally of upper and lower extremities. Motor examination shows 4+/5 symmetric hand grip and upper extremity and 5/5 lower extremity strength. There is no pronation or drift. Gait is non-ataxic   Lab Results  Component Value Date   HGBA1C 8.0 (H) 03/27/2022   HGBA1C 7.6 (H) 12/21/2021   HGBA1C 8.2 (H) 09/07/2021    Lab Results  Component Value Date   CREATININE 0.63 03/27/2022   CREATININE 0.62 12/21/2021   CREATININE 0.73 09/07/2021    Lab Results  Component Value Date   WBC 8.1 03/27/2022   HGB 15.1 03/27/2022   HCT 44.8 03/27/2022   PLT 222.0 03/27/2022   GLUCOSE 87 03/27/2022   CHOL  165 03/27/2022   TRIG 150.0 (H) 03/27/2022   HDL 35.50 (L) 03/27/2022   LDLDIRECT 106.0 03/27/2022   LDLCALC 99 03/27/2022   ALT 28 03/27/2022   AST 12 03/27/2022   NA 139 03/27/2022   K 3.8 03/27/2022   CL 104 03/27/2022   CREATININE 0.63 03/27/2022   BUN 12 03/27/2022   CO2 25 03/27/2022   TSH 2.29 03/27/2022   PSA 0.47 03/27/2022   HGBA1C 8.0 (H) 03/27/2022   MICROALBUR 14.5 (H) 12/21/2021    DG Foot Complete Left  Result Date: 04/29/2019 CLINICAL DATA:  56 year old male with a history of nonhealing wound EXAM: LEFT FOOT - COMPLETE 3+ VIEW COMPARISON:  04/15/2018, FINDINGS: No acute displaced fracture. Degenerative changes of the tibiotalar joint, subtalar joint, enthesopathic changes of Achilles insertion and plantar fascia insertion. Degenerative changes of the midfoot. Valgus deformity of the 2-4 metatarsophalangeal joints, with no dislocation. Degenerative changes of the interphalangeal joints. No erosive changes of the distal phalanx of the great toe. There is lucency associated with the dorsal aspect of the distal great toe with no radiopaque foreign body. IMPRESSION: No acute bony abnormality, with no erosive  changes of the great toe. Soft tissue changes on the dorsum of the great toe compatible with given history Degenerative changes of the foot Electronically Signed   By: Corrie Mckusick D.O.   On: 04/29/2019 08:38    Assessment & Plan:   Problem List Items Addressed This Visit     Venous stasis dermatitis of left lower extremity    Treated by Dr Phillip Heal with unknown cream. He has ordered compression stocking       Spinal stenosis of lumbar region with radiculopathy     His pain is managed with tramadol and gabapentin , both at low doses.   Refill history confirmed via  Controlled Substance databas, accessed by me today..      Obesity, diabetes, and hypertension syndrome ([place]) - Primary    He remains uncontrolled due to dietary indiscretions and irregular eating habits. Advised to take 5 units of novolog at lunch even if not eating. And to check pre and post prandial dinner sugars  .  Continue ozempic and jardiance       Relevant Orders   Comprehensive metabolic panel   Hemoglobin A1c   Fructosamine   Hypertension associated with diabetes ([place])    Well controlled on current regimen of amlodipine losartan and furosmide. Renal function stable, no changes today.  Lab Results  Component Value Date   CREATININE 0.63 03/27/2022   Lab Results  Component Value Date   NA 139 03/27/2022   K 3.8 03/27/2022   CL 104 03/27/2022   CO2 25 03/27/2022        Hyperlipidemia with target LDL less than 70   Relevant Orders   Lipid panel   LDL cholesterol, direct    I spent a total of   minutes with this patient in a face to face visit on the date of this encounter reviewing the last office visit with me in       ,  most recent visit with cardiology ,    ,  patient's diet and exercise habits, home blood pressure /blod sugar readings, recent ER visit including labs and imaging studies ,   and post visit ordering of testing and therapeutics.    Follow-up: Return in about 3 months (around  07/03/2022) for follow up diabetes.   [name], MD

## 2022-04-06 NOTE — Assessment & Plan Note (Signed)
His pain is managed with tramadol and gabapentin , both at low doses.   Refill history confirmed via [place] Controlled Substance databas, accessed by me today.Connor Ross

## 2022-04-08 NOTE — Telephone Encounter (Signed)
Pt called and he stated that he would take that chance & was okay with this as he has had good success with [facility] plus meds are free.

## 2022-04-08 NOTE — Telephone Encounter (Signed)
Close  

## 2022-04-22 ENCOUNTER — Telehealth: Payer: Self-pay | Admitting: Pharmacy Technician

## 2022-04-22 ENCOUNTER — Encounter: Payer: Self-pay | Admitting: Internal Medicine

## 2022-04-22 DIAGNOSIS — Z596 Low income: Secondary | ICD-10-CM

## 2022-04-22 DIAGNOSIS — I152 Hypertension secondary to endocrine disorders: Secondary | ICD-10-CM

## 2022-04-22 NOTE — Telephone Encounter (Signed)
Printed and placed in results folder 

## 2022-04-22 NOTE — Progress Notes (Signed)
[facility])                                            [name]    04/22/2022  Connor Ross [birth year] [id number]                                      Medication Assistance Referral-FOR 2024 RE ENROLLMENT  Referral From:  [facility]  Connor Ross  Medication/Company: Vania Rea / [employer] Patient application portion:  Mailed Provider application portion: Faxed  to Dr. Deborra Medina Provider address/fax verified via: Office website  Medication/Company: Tyler Aas / [employer] Patient application portion:  Mailed Provider application portion: Faxed  to Dr. Deborra Medina Provider address/fax verified via: Office website  Medication/Company: Cira Servant / [employer] Patient application portion:  Mailed Provider application portion: Faxed  to Dr. Deborra Medina Provider address/fax verified via: Office website  Medication/Company: Larna Daughters / Novo [employer] Patient application portion:  Mailed Provider application portion: Faxed  to Dr. Deborra Medina Provider address/fax verified via: Office website   Connor Ross P. Remi Rester, [place]  [phone]

## 2022-04-23 NOTE — Assessment & Plan Note (Signed)
Reviewed recent BS from sept 21 to Oct 2  And determined that his snacks that are rasiing his  sugars.  If you are going to snack you will need to take 2 units  of novolog when you snack

## 2022-05-04 DIAGNOSIS — E1165 Type 2 diabetes mellitus with hyperglycemia: Secondary | ICD-10-CM | POA: Diagnosis not present

## 2022-05-08 ENCOUNTER — Other Ambulatory Visit: Payer: Self-pay | Admitting: Internal Medicine

## 2022-05-19 ENCOUNTER — Encounter: Payer: Self-pay | Admitting: Internal Medicine

## 2022-05-22 ENCOUNTER — Telehealth: Payer: Self-pay

## 2022-05-22 NOTE — Telephone Encounter (Signed)
Pt's wife came & picked up patient's samples of the 25 mg Jardiance to get him through the month of December. Given to Anna to take up front to give to pt's wife.

## 2022-06-03 DIAGNOSIS — H61001 Unspecified perichondritis of right external ear: Secondary | ICD-10-CM | POA: Diagnosis not present

## 2022-06-03 DIAGNOSIS — B353 Tinea pedis: Secondary | ICD-10-CM | POA: Diagnosis not present

## 2022-06-03 DIAGNOSIS — I872 Venous insufficiency (chronic) (peripheral): Secondary | ICD-10-CM | POA: Diagnosis not present

## 2022-06-03 DIAGNOSIS — L2089 Other atopic dermatitis: Secondary | ICD-10-CM | POA: Diagnosis not present

## 2022-06-03 DIAGNOSIS — Z79899 Other long term (current) drug therapy: Secondary | ICD-10-CM | POA: Diagnosis not present

## 2022-06-04 ENCOUNTER — Telehealth: Payer: Self-pay

## 2022-06-04 DIAGNOSIS — E1165 Type 2 diabetes mellitus with hyperglycemia: Secondary | ICD-10-CM | POA: Diagnosis not present

## 2022-06-04 NOTE — Telephone Encounter (Signed)
Spoke with pt to let him know that we have received his patient assistance medication and that it is ready for pick up.   Connor Ross: 6 boxes Pen Needles: 5 boxes

## 2022-06-10 ENCOUNTER — Telehealth: Payer: Self-pay

## 2022-06-10 DIAGNOSIS — Z79899 Other long term (current) drug therapy: Secondary | ICD-10-CM

## 2022-06-10 DIAGNOSIS — L209 Atopic dermatitis, unspecified: Secondary | ICD-10-CM

## 2022-06-10 NOTE — Telephone Encounter (Signed)
Patient dropped off lab request from [facility].  Form is in Dr. Helene Kelp [name]'s color folder up front.

## 2022-06-11 NOTE — Telephone Encounter (Signed)
Received lab requisition and ordered additional labs to have down with our labs on 06/28/2022.

## 2022-06-11 NOTE — Telephone Encounter (Signed)
Pt picked up medication on 06/10/2022.

## 2022-06-23 ENCOUNTER — Other Ambulatory Visit: Payer: Self-pay

## 2022-06-23 ENCOUNTER — Emergency Department: Payer: Medicare HMO

## 2022-06-23 DIAGNOSIS — M48061 Spinal stenosis, lumbar region without neurogenic claudication: Secondary | ICD-10-CM | POA: Diagnosis present

## 2022-06-23 DIAGNOSIS — Z1152 Encounter for screening for COVID-19: Secondary | ICD-10-CM

## 2022-06-23 DIAGNOSIS — E785 Hyperlipidemia, unspecified: Secondary | ICD-10-CM | POA: Diagnosis present

## 2022-06-23 DIAGNOSIS — Z7984 Long term (current) use of oral hypoglycemic drugs: Secondary | ICD-10-CM

## 2022-06-23 DIAGNOSIS — I10 Essential (primary) hypertension: Secondary | ICD-10-CM | POA: Diagnosis present

## 2022-06-23 DIAGNOSIS — Z7982 Long term (current) use of aspirin: Secondary | ICD-10-CM | POA: Diagnosis not present

## 2022-06-23 DIAGNOSIS — Z83438 Family history of other disorder of lipoprotein metabolism and other lipidemia: Secondary | ICD-10-CM | POA: Diagnosis not present

## 2022-06-23 DIAGNOSIS — Z634 Disappearance and death of family member: Secondary | ICD-10-CM

## 2022-06-23 DIAGNOSIS — E1169 Type 2 diabetes mellitus with other specified complication: Secondary | ICD-10-CM | POA: Diagnosis present

## 2022-06-23 DIAGNOSIS — R079 Chest pain, unspecified: Secondary | ICD-10-CM | POA: Diagnosis not present

## 2022-06-23 DIAGNOSIS — I214 Non-ST elevation (NSTEMI) myocardial infarction: Principal | ICD-10-CM | POA: Diagnosis present

## 2022-06-23 DIAGNOSIS — Z794 Long term (current) use of insulin: Secondary | ICD-10-CM | POA: Diagnosis not present

## 2022-06-23 DIAGNOSIS — G8929 Other chronic pain: Secondary | ICD-10-CM | POA: Diagnosis present

## 2022-06-23 DIAGNOSIS — Z79899 Other long term (current) drug therapy: Secondary | ICD-10-CM | POA: Diagnosis not present

## 2022-06-23 DIAGNOSIS — Z833 Family history of diabetes mellitus: Secondary | ICD-10-CM | POA: Diagnosis not present

## 2022-06-23 DIAGNOSIS — Z888 Allergy status to other drugs, medicaments and biological substances status: Secondary | ICD-10-CM | POA: Diagnosis not present

## 2022-06-23 DIAGNOSIS — Z807 Family history of other malignant neoplasms of lymphoid, hematopoietic and related tissues: Secondary | ICD-10-CM | POA: Diagnosis not present

## 2022-06-23 DIAGNOSIS — I200 Unstable angina: Secondary | ICD-10-CM | POA: Diagnosis present

## 2022-06-23 DIAGNOSIS — Z8249 Family history of ischemic heart disease and other diseases of the circulatory system: Secondary | ICD-10-CM | POA: Diagnosis not present

## 2022-06-23 DIAGNOSIS — Z6841 Body Mass Index (BMI) 40.0 and over, adult: Secondary | ICD-10-CM

## 2022-06-23 DIAGNOSIS — G4733 Obstructive sleep apnea (adult) (pediatric): Secondary | ICD-10-CM | POA: Diagnosis present

## 2022-06-23 DIAGNOSIS — M5416 Radiculopathy, lumbar region: Secondary | ICD-10-CM | POA: Diagnosis present

## 2022-06-23 DIAGNOSIS — R197 Diarrhea, unspecified: Secondary | ICD-10-CM | POA: Diagnosis present

## 2022-06-23 DIAGNOSIS — Z825 Family history of asthma and other chronic lower respiratory diseases: Secondary | ICD-10-CM | POA: Diagnosis not present

## 2022-06-23 DIAGNOSIS — E118 Type 2 diabetes mellitus with unspecified complications: Secondary | ICD-10-CM | POA: Diagnosis not present

## 2022-06-23 LAB — CBC
HCT: 45.5 % (ref 39.0–52.0)
Hemoglobin: 15.1 g/dL (ref 13.0–17.0)
MCH: 30.2 pg (ref 26.0–34.0)
MCHC: 33.2 g/dL (ref 30.0–36.0)
MCV: 91 fL (ref 80.0–100.0)
Platelets: 272 10*3/uL (ref 150–400)
RBC: 5 MIL/uL (ref 4.22–5.81)
RDW: 13 % (ref 11.5–15.5)
WBC: 10.8 10*3/uL — ABNORMAL HIGH (ref 4.0–10.5)
nRBC: 0 % (ref 0.0–0.2)

## 2022-06-23 LAB — BASIC METABOLIC PANEL
Anion gap: 11 (ref 5–15)
BUN: 14 mg/dL (ref 6–20)
CO2: 23 mmol/L (ref 22–32)
Calcium: 9.1 mg/dL (ref 8.9–10.3)
Chloride: 104 mmol/L (ref 98–111)
Creatinine, Ser: 0.81 mg/dL (ref 0.61–1.24)
GFR, Estimated: >60 mL/min (ref >60)
Glucose, Bld: 231 mg/dL — ABNORMAL HIGH (ref 70–99)
Potassium: 3.5 mmol/L (ref 3.5–5.1)
Sodium: 138 mmol/L (ref 135–145)

## 2022-06-23 LAB — TROPONIN I (HIGH SENSITIVITY): Troponin I (High Sensitivity): 70 ng/L — ABNORMAL HIGH (ref <18)

## 2022-06-23 NOTE — ED Triage Notes (Signed)
Patient reports chest pain that began at 1400 today while resting in chair. States pain radiates to back. Reports pain went away but returned while riding in car at 2115 associated with diaphoresis. Denies shortness of breath or nausea. AOX4. Speaking in full clear sentences. Ambulatory with steady gait.

## 2022-06-24 ENCOUNTER — Other Ambulatory Visit: Payer: Self-pay

## 2022-06-24 ENCOUNTER — Inpatient Hospital Stay
Admit: 2022-06-24 | Discharge: 2022-06-24 | Disposition: A | Discharge status: Home / Self Care | Payer: Medicare HMO | Attending: Cardiology | Admitting: Cardiology

## 2022-06-24 ENCOUNTER — Inpatient Hospital Stay
Admission: EM | Admit: 2022-06-24 | Discharge: 2022-06-27 | DRG: 321 | Disposition: A | Discharge status: Home / Self Care | Payer: Medicare HMO | Attending: Internal Medicine | Admitting: Internal Medicine

## 2022-06-24 DIAGNOSIS — I10 Essential (primary) hypertension: Secondary | ICD-10-CM | POA: Diagnosis present

## 2022-06-24 DIAGNOSIS — Z634 Disappearance and death of family member: Secondary | ICD-10-CM | POA: Diagnosis not present

## 2022-06-24 DIAGNOSIS — G4733 Obstructive sleep apnea (adult) (pediatric): Secondary | ICD-10-CM

## 2022-06-24 DIAGNOSIS — Z1152 Encounter for screening for COVID-19: Secondary | ICD-10-CM | POA: Diagnosis not present

## 2022-06-24 DIAGNOSIS — G8929 Other chronic pain: Secondary | ICD-10-CM | POA: Diagnosis present

## 2022-06-24 DIAGNOSIS — R197 Diarrhea, unspecified: Secondary | ICD-10-CM | POA: Diagnosis present

## 2022-06-24 DIAGNOSIS — Z833 Family history of diabetes mellitus: Secondary | ICD-10-CM | POA: Diagnosis not present

## 2022-06-24 DIAGNOSIS — I214 Non-ST elevation (NSTEMI) myocardial infarction: Principal | ICD-10-CM

## 2022-06-24 DIAGNOSIS — M5416 Radiculopathy, lumbar region: Secondary | ICD-10-CM | POA: Diagnosis present

## 2022-06-24 DIAGNOSIS — Z6841 Body Mass Index (BMI) 40.0 and over, adult: Secondary | ICD-10-CM | POA: Diagnosis not present

## 2022-06-24 DIAGNOSIS — E119 Type 2 diabetes mellitus without complications: Secondary | ICD-10-CM

## 2022-06-24 DIAGNOSIS — Z807 Family history of other malignant neoplasms of lymphoid, hematopoietic and related tissues: Secondary | ICD-10-CM | POA: Diagnosis not present

## 2022-06-24 DIAGNOSIS — Z825 Family history of asthma and other chronic lower respiratory diseases: Secondary | ICD-10-CM | POA: Diagnosis not present

## 2022-06-24 DIAGNOSIS — Z888 Allergy status to other drugs, medicaments and biological substances status: Secondary | ICD-10-CM | POA: Diagnosis not present

## 2022-06-24 DIAGNOSIS — Z7982 Long term (current) use of aspirin: Secondary | ICD-10-CM | POA: Diagnosis not present

## 2022-06-24 DIAGNOSIS — Z7984 Long term (current) use of oral hypoglycemic drugs: Secondary | ICD-10-CM | POA: Diagnosis not present

## 2022-06-24 DIAGNOSIS — I200 Unstable angina: Secondary | ICD-10-CM | POA: Diagnosis present

## 2022-06-24 DIAGNOSIS — E785 Hyperlipidemia, unspecified: Secondary | ICD-10-CM | POA: Diagnosis present

## 2022-06-24 DIAGNOSIS — Z8249 Family history of ischemic heart disease and other diseases of the circulatory system: Secondary | ICD-10-CM | POA: Diagnosis not present

## 2022-06-24 DIAGNOSIS — Z794 Long term (current) use of insulin: Secondary | ICD-10-CM | POA: Diagnosis not present

## 2022-06-24 DIAGNOSIS — Z79899 Other long term (current) drug therapy: Secondary | ICD-10-CM | POA: Diagnosis not present

## 2022-06-24 DIAGNOSIS — E1169 Type 2 diabetes mellitus with other specified complication: Secondary | ICD-10-CM | POA: Diagnosis present

## 2022-06-24 DIAGNOSIS — M48061 Spinal stenosis, lumbar region without neurogenic claudication: Secondary | ICD-10-CM | POA: Diagnosis present

## 2022-06-24 DIAGNOSIS — Z83438 Family history of other disorder of lipoprotein metabolism and other lipidemia: Secondary | ICD-10-CM | POA: Diagnosis not present

## 2022-06-24 DIAGNOSIS — I252 Old myocardial infarction: Secondary | ICD-10-CM | POA: Diagnosis present

## 2022-06-24 LAB — TROPONIN I (HIGH SENSITIVITY)
Troponin I (High Sensitivity): 110 ng/L (ref <18)
Troponin I (High Sensitivity): 172 ng/L (ref <18)
Troponin I (High Sensitivity): 165 ng/L (ref <18)
Troponin I (High Sensitivity): 292 ng/L (ref <18)

## 2022-06-24 LAB — ECHOCARDIOGRAM COMPLETE
AR max vel: 3.03 cm2
AV Area VTI: 3.38 cm2
AV Area mean vel: 2.99 cm2
AV Mean grad: 5 mmHg
AV Peak grad: 9.5 mmHg
Ao pk vel: 1.54 m/s
Area-P 1/2: 3.58 cm2
S' Lateral: 3.4 cm

## 2022-06-24 LAB — APTT: aPTT: 29 seconds (ref 24–36)

## 2022-06-24 LAB — CBG MONITORING, ED
Glucose-Capillary: 182 mg/dL — ABNORMAL HIGH (ref 70–99)
Glucose-Capillary: 258 mg/dL — ABNORMAL HIGH (ref 70–99)

## 2022-06-24 LAB — HEPARIN LEVEL (UNFRACTIONATED)
Heparin Unfractionated: <0.1 IU/mL — ABNORMAL LOW (ref 0.30–0.70)
Heparin Unfractionated: 0.18 IU/mL — ABNORMAL LOW (ref 0.30–0.70)

## 2022-06-24 LAB — RESP PANEL BY RT-PCR (FLU A&B, COVID) ARPGX2
Influenza A by PCR: NEGATIVE
Influenza B by PCR: NEGATIVE
SARS Coronavirus 2 by RT PCR: NEGATIVE

## 2022-06-24 LAB — PROTIME-INR
INR: 1 (ref 0.8–1.2)
Prothrombin Time: 12.9 seconds (ref 11.4–15.2)

## 2022-06-24 LAB — HIV ANTIBODY (ROUTINE TESTING W REFLEX): HIV Screen 4th Generation wRfx: NONREACTIVE

## 2022-06-24 MED ORDER — FUROSEMIDE 20 MG PO TABS
20.0000 mg | ORAL_TABLET | ORAL | Status: DC
  Administered 2022-06-24 – 2022-06-26 (×2): 20 mg via ORAL
  Filled 2022-06-24 (×2): qty 1

## 2022-06-24 MED ORDER — HEPARIN SODIUM (PORCINE) 5000 UNIT/ML IJ SOLN: 4000.0000 [IU] | Freq: Once | INTRAMUSCULAR | Status: DC

## 2022-06-24 MED ORDER — ISOSORBIDE MONONITRATE ER 60 MG PO TB24
60.0000 mg | ORAL_TABLET | Freq: Every day | ORAL | Status: DC
  Administered 2022-06-24 – 2022-06-27 (×4): 60 mg via ORAL
  Filled 2022-06-24 (×4): qty 1

## 2022-06-24 MED ORDER — NITROGLYCERIN 0.4 MG SL SUBL: 0.4000 mg | SUBLINGUAL_TABLET | SUBLINGUAL | Status: DC | PRN

## 2022-06-24 MED ORDER — TAMSULOSIN HCL 0.4 MG PO CAPS
0.4000 mg | ORAL_CAPSULE | Freq: Every evening | ORAL | Status: DC
  Administered 2022-06-24: 0.4 mg via ORAL
  Filled 2022-06-24: qty 1

## 2022-06-24 MED ORDER — SODIUM CHLORIDE 0.9 % IV SOLN: 250.0000 mL | INTRAVENOUS | Status: DC | PRN

## 2022-06-24 MED ORDER — ASPIRIN 81 MG PO TBEC: 81.0000 mg | DELAYED_RELEASE_TABLET | Freq: Every day | ORAL | Status: DC

## 2022-06-24 MED ORDER — ASPIRIN 81 MG PO CHEW
324.0000 mg | CHEWABLE_TABLET | Freq: Once | ORAL | Status: AC
  Administered 2022-06-24: 324 mg via ORAL
  Filled 2022-06-24: qty 4

## 2022-06-24 MED ORDER — HEPARIN (PORCINE) 25000 UT/250ML-% IV SOLN
2300.0000 [IU]/h | INTRAVENOUS | Status: DC
  Administered 2022-06-24: 1400 [IU]/h via INTRAVENOUS
  Filled 2022-06-24 (×3): qty 250

## 2022-06-24 MED ORDER — AMLODIPINE BESYLATE 5 MG PO TABS: 2.5000 mg | ORAL_TABLET | Freq: Every day | ORAL | Status: DC

## 2022-06-24 MED ORDER — INSULIN ASPART 100 UNIT/ML IJ SOLN
0.0000 [IU] | Freq: Three times a day (TID) | INTRAMUSCULAR | Status: DC
  Administered 2022-06-24: 11 [IU] via SUBCUTANEOUS
  Administered 2022-06-24 – 2022-06-25 (×2): 4 [IU] via SUBCUTANEOUS
  Administered 2022-06-26: 7 [IU] via SUBCUTANEOUS
  Administered 2022-06-26 – 2022-06-27 (×2): 4 [IU] via SUBCUTANEOUS
  Filled 2022-06-24 (×6): qty 1

## 2022-06-24 MED ORDER — SODIUM CHLORIDE 0.9% FLUSH
3.0000 mL | Freq: Two times a day (BID) | INTRAVENOUS | Status: DC
  Administered 2022-06-25: 3 mL via INTRAVENOUS

## 2022-06-24 MED ORDER — SODIUM CHLORIDE 0.9% FLUSH: 3.0000 mL | INTRAVENOUS | Status: DC | PRN

## 2022-06-24 MED ORDER — INSULIN DETEMIR 100 UNIT/ML ~~LOC~~ SOLN
50.0000 [IU] | Freq: Every day | SUBCUTANEOUS | Status: DC
  Administered 2022-06-25 – 2022-06-27 (×3): 50 [IU] via SUBCUTANEOUS
  Filled 2022-06-24 (×4): qty 0.5

## 2022-06-24 MED ORDER — LOSARTAN POTASSIUM 50 MG PO TABS
100.0000 mg | ORAL_TABLET | Freq: Every day | ORAL | Status: DC
  Administered 2022-06-24 – 2022-06-27 (×4): 100 mg via ORAL
  Filled 2022-06-24 (×4): qty 2

## 2022-06-24 MED ORDER — SODIUM CHLORIDE 0.9 % IV SOLN: INTRAVENOUS | Status: DC

## 2022-06-24 MED ORDER — ONDANSETRON HCL 4 MG/2ML IJ SOLN: 4.0000 mg | Freq: Four times a day (QID) | INTRAMUSCULAR | Status: DC | PRN

## 2022-06-24 MED ORDER — GABAPENTIN 100 MG PO CAPS
200.0000 mg | ORAL_CAPSULE | Freq: Three times a day (TID) | ORAL | Status: DC
  Administered 2022-06-24 – 2022-06-27 (×8): 200 mg via ORAL
  Filled 2022-06-24 (×8): qty 2

## 2022-06-24 MED ORDER — METOPROLOL TARTRATE 25 MG PO TABS
25.0000 mg | ORAL_TABLET | Freq: Two times a day (BID) | ORAL | Status: DC
  Administered 2022-06-24 – 2022-06-27 (×7): 25 mg via ORAL
  Filled 2022-06-24 (×7): qty 1

## 2022-06-24 MED ORDER — ROSUVASTATIN CALCIUM 10 MG PO TABS
40.0000 mg | ORAL_TABLET | Freq: Every evening | ORAL | Status: DC
  Filled 2022-06-24 (×3): qty 2

## 2022-06-24 MED ORDER — ACETAMINOPHEN 325 MG PO TABS: 650.0000 mg | ORAL_TABLET | ORAL | Status: DC | PRN

## 2022-06-24 MED ORDER — HEPARIN BOLUS VIA INFUSION
3000.0000 [IU] | Freq: Once | INTRAVENOUS | Status: AC
  Administered 2022-06-24: 3000 [IU] via INTRAVENOUS
  Filled 2022-06-24: qty 3000

## 2022-06-24 MED ORDER — ROSUVASTATIN CALCIUM 10 MG PO TABS: 10.0000 mg | ORAL_TABLET | Freq: Every evening | ORAL | Status: DC

## 2022-06-24 MED ORDER — HEPARIN BOLUS VIA INFUSION
4000.0000 [IU] | Freq: Once | INTRAVENOUS | Status: AC
  Administered 2022-06-24: 4000 [IU] via INTRAVENOUS
  Filled 2022-06-24: qty 4000

## 2022-06-24 MED ORDER — PERFLUTREN LIPID MICROSPHERE
1.0000 mL | INTRAVENOUS | Status: AC | PRN
  Administered 2022-06-24: 3 mL via INTRAVENOUS

## 2022-06-24 MED ORDER — ASPIRIN 81 MG PO TBEC
81.0000 mg | DELAYED_RELEASE_TABLET | Freq: Every day | ORAL | Status: DC
  Filled 2022-06-24: qty 1

## 2022-06-24 MED ORDER — ASPIRIN 81 MG PO CHEW
81.0000 mg | CHEWABLE_TABLET | ORAL | Status: AC
  Administered 2022-06-25: 81 mg via ORAL
  Filled 2022-06-24: qty 1

## 2022-06-24 NOTE — Progress Notes (Signed)
*  PRELIMINARY RESULTS* Echocardiogram 2D Echocardiogram has been performed.  Connor Ross Connor Ross Connor Ross Connor Ross 06/24/2022, 1:41 PM

## 2022-06-24 NOTE — ED Notes (Signed)
Pt expresses having an episode of chest pain in the lobby, stated chest and neck were hurting. First nurse notified.
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
## 2022-06-24 NOTE — ED Provider Notes (Signed)
[facility] Provider Note    Event Date/Time   First MD Initiated Contact with Patient 06/24/22 [phone]     (approximate)   History   Chest Pain   HPI  Connor Ross is a 56 y.o. male who presents to the ED for evaluation of Chest Pain   I review a PCP visit from 9/20. Morbidly obese patient with hx HTN, DM , HLD.  Self-reports that his mother died of a heart attack at age 49. Patient is a non-smoker  He reports 3 episodes of chest pain over the past 12-24 hours.  Each episode is similar, lasting up to 5 minutes with associated diaphoresis, nausea and radiation up to his neck.  No chest pain right now.   Physical Exam   Triage Vital Signs: ED Triage Vitals  Enc Vitals Group     BP 06/23/22 2217 (!) 172/73     Pulse Rate 06/23/22 2217 96     Resp 06/23/22 2217 20     Temp 06/23/22 2217 98.5 F (36.9 C)     Temp Source 06/23/22 2217 Oral     SpO2 06/23/22 2217 96 %     Weight 06/23/22 2215 (!) 310 lb (140.6 kg)     Height 06/23/22 2215 '5\' 11"'$  (1.803 m)     Head Circumference --      Peak Flow --      Pain Score 06/23/22 2214 2     Pain Loc --      Pain Edu? --      Excl. in [place]? --     Most recent vital signs: Vitals:   06/23/22 2217 06/24/22 0326  BP: (!) 172/73 (!) 151/75  Pulse: 96 88  Resp: 20 17  Temp: 98.5 F (36.9 C) 98.4 F (36.9 C)  SpO2: 96% 96%    General: Awake, no distress.  CV:  Good peripheral perfusion.  Resp:  Normal effort.  Abd:  No distention.  MSK:  No deformity noted.  Neuro:  No focal deficits appreciated. Other:     ED Results / Procedures / Treatments   Labs (all labs ordered are listed, but only abnormal results are displayed) Labs Reviewed  BASIC METABOLIC PANEL - Abnormal; Notable for the following components:      Result Value   Glucose, Bld 231 (*)    All other components within normal limits  CBC - Abnormal; Notable for the following components:   WBC 10.8 (*)    All other  components within normal limits  TROPONIN I (HIGH SENSITIVITY) - Abnormal; Notable for the following components:   Troponin I (High Sensitivity) 70 (*)    All other components within normal limits  TROPONIN I (HIGH SENSITIVITY) - Abnormal; Notable for the following components:   Troponin I (High Sensitivity) 110 (*)    All other components within normal limits  RESP PANEL BY RT-PCR (FLU A&B, COVID) ARPGX2  APTT  PROTIME-INR  TROPONIN I (HIGH SENSITIVITY)    EKG Sinus rhythm with a rate of 96bpm . Normal axis. Normal intervals. ST depressions to septal leads without STEMI.   RADIOLOGY CXR interpreted by me without evidence of acute cardiopulmonary pathology.  Official radiology report(s): DG Chest 2 View  Result Date: 06/23/2022 CLINICAL DATA:  Chest pain radiating to the back. EXAM: CHEST - 2 VIEW COMPARISON:  Radiograph 01/16/2013 FINDINGS: The heart is normal in size. Normal mediastinal contours. Mild bronchial thickening with borderline hyperinflation. No focal airspace disease, pleural effusion  or pneumothorax. IMPRESSION: Mild bronchial thickening with borderline hyperinflation, can be seen with bronchitis or asthma. Electronically Signed   By: Keith Rake M.D.   On: 06/23/2022 22:37    PROCEDURES and INTERVENTIONS:  .1-3 Lead EKG Interpretation  Performed by: Vladimir Crofts, MD Authorized by: Vladimir Crofts, MD     Interpretation: normal     ECG rate:  86   ECG rate assessment: normal     Rhythm: sinus rhythm     Ectopy: none     Conduction: normal   .Critical Care  Performed by: Vladimir Crofts, MD Authorized by: Vladimir Crofts, MD   Critical care provider statement:    Critical care time (minutes):  30   Critical care time was exclusive of:  Separately billable procedures and treating other patients   Critical care was necessary to treat or prevent imminent or life-threatening deterioration of the following conditions:  Cardiac failure and circulatory failure    Critical care was time spent personally by me on the following activities:  Development of treatment plan with patient or surrogate, discussions with consultants, evaluation of patient's response to treatment, examination of patient, ordering and review of laboratory studies, ordering and review of radiographic studies, ordering and performing treatments and interventions, pulse oximetry, re-evaluation of patient's condition and review of old charts   Medications  heparin ADULT infusion 100 units/mL (25000 units/235m) (1,400 Units/hr Intravenous New Bag/Given 06/24/22 0636)  aspirin chewable tablet 324 mg (324 mg Oral Given 06/24/22 00865  heparin bolus via infusion 4,000 Units (4,000 Units Intravenous Bolus from Bag 06/24/22 0637)     IMPRESSION / MDM / [place]/ ED COURSE  I reviewed the triage vital signs and the nursing notes.  Differential diagnosis includes, but is not limited to, ACS, PTX, PNA, muscle strain/spasm, PE, dissection  {Patient presents with symptoms of an acute illness or injury that is potentially life-threatening.  56year old male with history of metabolic syndrome and a family history of coronary disease at a young age presents with signs of an NSTEMI requiring heparinization and medical admission.  No current chest pain, EKG with some ST depressions and mild ischemic changes without STEMI.  Troponins are rising 70-> 110.  He was provided aspirin and started on heparin.  Other blood work is generally reassuring, essentially normal CBC.  Metabolic panel with hyperglycemia without acidosis.  CXR is clear.  We will consult medicine for admission      FINAL CLINICAL IMPRESSION(S) / ED DIAGNOSES   Final diagnoses:  NSTEMI (non-ST elevated myocardial infarction) ([place]     Rx / DC Orders   ED Discharge Orders     None        Note:  This document was prepared using Dragon voice recognition software and may include unintentional dictation errors.    SVladimir Crofts MD 06/24/22 [phone]

## 2022-06-24 NOTE — H&P (Signed)
History and Physical    Patient: Connor Ross:[id number] DOB: [birth year] DOA: 06/24/2022 DOS: the patient was seen and examined on 06/24/2022 PCP: [name], MD  Patient coming from: Home  Chief Complaint:  Chief Complaint  Patient presents with   Chest Pain   HPI: Connor Ross is a 56 y.o. male with medical history significant for diabetes mellitus, hypertension, dyslipidemia, morbid obesity (BMI 43) who presents to the emergency room for evaluation of chest pain. Patient states that symptoms started at about 2 PM the day prior to his admission while he was sitting in a car.  Pain was midsternal, rated a 7 x 10 in intensity at its worst and radiated to his back associated with diaphoresis.  He denied having any nausea, no vomiting, no palpitations or shortness of breath.  Symptoms lasted about 5 minutes and resolved. He has had several more episodes of pain with the last episode radiating to his neck. He also complains of diarrhea but denies having any abdominal pain. He denies having any fever, no chills, no dizziness, no lightheadedness, no headache, no blurred vision, no focal deficit, no leg swelling or urinary symptoms. He has a significant family history of coronary artery disease and at the time of this H&P he is chest pain-free. Labs show an uptrending troponin level 110 >> 172 Twelve-lead EKG reviewed by me shows sinus rhythm with ST depressions in the lateral leads.    Review of Systems: As mentioned in the history of present illness. All other systems reviewed and are negative. Past Medical History:  Diagnosis Date   Diabetes mellitus    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 type 2 diabetes mellitus, limited to breakdown of skin ([place]) 04/23/2019   Fracture of one rib, left side, subsequent encounter for fracture with routine healing 08/27/2020   Hyperlipidemia    Hypertension    Normal cardiac stress test 2012   [facility]   Past  Surgical History:  Procedure Laterality Date   EXOSTECTOMY Right 06/2019   5th MT head  ([facility])   LUMBAR DISC SURGERY     Social History:  reports that he has never smoked. He has never used smokeless tobacco. He reports that he does not drink alcohol and does not use drugs.  Allergies  Allergen Reactions   Hctz [Hydrochlorothiazide] Other (See Comments)    Extreme Cramping   Metformin And Related Diarrhea    Family History  Problem Relation Age of Onset   Coronary artery disease Mother 67   COPD Mother    Heart disease Mother    Hyperlipidemia Mother    Hypertension Mother    Cancer Father    Hyperlipidemia Father    Hypertension Brother    Cancer Brother        lymphoma   Diabetes Maternal Aunt    Diabetes Maternal Uncle    Diabetes Maternal Grandmother    Heart disease Maternal Grandmother     Prior to Admission medications   Medication Sig Start Date End Date Taking? Authorizing Provider  traMADol (ULTRAM) 50 MG tablet TAKE 1 TABLET EVERY 12 HOURS AS NEEDED 04/07/22   [name], MD  amLODipine (NORVASC) 2.5 MG tablet TAKE 1 TABLET EVERY DAY 05/08/22   [name], MD  ASPIRIN LOW DOSE 81 MG EC tablet TAKE 1 TABLET BY MOUTH  DAILY (SWALLOW WHOLE) 11/08/19   [name], MD  blood glucose meter kit and supplies Dispense based on patient  and insurance preference. Use up to four times daily as directed. (FOR ICD-10 E10.9, E11.9). Use to check blood sugar three times daily 04/21/18   [name], MD  clobetasol cream (TEMOVATE) 0.05 % Apply topically 2 (two) times daily. 02/28/22   [provider]  Continuous Blood Gluc Sensor (FREESTYLE LIBRE 2 SENSOR) MISC Use to check sugar at least 4 times daily 03/13/20   [name], MD  fluticasone (FLONASE) 50 MCG/ACT nasal spray PLACE 2 SPRAYS INTO BOTH NOSTRILS DAILY. AT NIGHT AFTER NASAL SALINE 12/24/21   [name], MD  furosemide (LASIX) 20 MG tablet TAKE 1 TABLET (20 MG TOTAL) BY MOUTH EVERY  OTHER DAY. 10/22/21   [name], MD  gabapentin (NEURONTIN) 100 MG capsule Take 2 capsules (200 mg total) by mouth 3 (three) times daily. 04/05/22   [name], MD  insulin aspart (NOVOLOG FLEXPEN) 100 UNIT/ML FlexPen Inject up to 25 units with breakfast, 25 units with lunch, and 30 units with supper 04/17/20   [name], MD  insulin degludec (TRESIBA FLEXTOUCH) 200 UNIT/ML FlexTouch Pen Inject 88 Units into the skin daily. Patient taking differently: Inject 86 Units into the skin daily. 03/24/20   [name], MD  Insulin Pen Needle 32G X 6 MM MISC Use daily with insulin pen 05/14/13   [name], MD  JARDIANCE 25 MG TABS tablet TAKE 1 TABLET EVERY DAY 12/13/21   [name], MD  ketoconazole (NIZORAL) 2 % cream SMARTSIG:1 Application Topical 1 to 2 Times Daily 04/01/22   [provider]  Lancets (FREESTYLE) lancets 1 each by Other route 2 (two) times daily. DX 250.02 08/16/11   [name], MD  losartan (COZAAR) 100 MG tablet TAKE 1 TABLET (100 MG TOTAL) DAILY. 02/12/22   [name], MD  ONETOUCH VERIO test strip USE TO CHECK BLOOD SUGAR 3  TIMES DAILY 05/03/19   [name], MD  rosuvastatin (CRESTOR) 10 MG tablet TAKE 1 TABLET EVERY DAY 09/19/21   [name], MD  Semaglutide, 2 MG/DOSE, (OZEMPIC, 2 MG/DOSE,) 8 MG/3ML SOPN Inject 2 mg into the skin once a week. 12/05/20   [name], MD  sildenafil (VIAGRA) 50 MG tablet Take 1 tablet (50 mg total) by mouth daily as needed for erectile dysfunction. 03/12/21   [name], MD  tamsulosin (FLOMAX) 0.4 MG CAPS capsule TAKE 1 CAPSULE EVERY DAY 01/09/22   [name], MD  triamcinolone (KENALOG) 0.025 % cream Apply topically. 04/01/22   [provider]    Physical Exam: Vitals:   06/23/22 2217 06/24/22 0326 06/24/22 0639 06/24/22 0715  BP: (!) 172/73 (!) 151/75 (!) 151/80   Pulse: 96 88 86   Resp: _0 Temp: 98.5 F (36.9 C) 98.4 F (36.9 C)  98.3 F (36.8 C)  TempSrc: Oral  Oral  Oral  SpO2: 96% 96% 96%   Weight:      Height:       Physical Exam Vitals reviewed.  Constitutional:      Appearance: He is obese.  HENT:     Head: Normocephalic and atraumatic.  Cardiovascular:     Rate and Rhythm: Normal rate and regular rhythm.     Heart sounds: Normal heart sounds.  Pulmonary:     Effort: Pulmonary effort is normal.     Breath sounds: Normal breath sounds.  Abdominal:     General: Bowel sounds are normal.  Palpations: Abdomen is soft.  Musculoskeletal:        General: Normal range of motion.     Cervical back: Normal range of motion and neck supple.  Skin:    General: Skin is warm and dry.  Neurological:     General: No focal deficit present.     Mental Status: He is alert.  Psychiatric:        Mood and Affect: Mood is anxious.     Data Reviewed: Relevant notes from primary care and specialist visits, past discharge summaries as available in EHR, including Care Everywhere. Prior diagnostic testing as pertinent to current admission diagnoses Updated medications and problem lists for reconciliation ED course, including vitals, labs, imaging, treatment and response to treatment Triage notes, nursing and pharmacy notes and ED provider's notes Notable results as noted in HPI Labs reviewed.  Sodium 138, potassium 3.5, chloride 104, bicarb 23, glucose 231, BUN 14, creatinine 0.81, calcium 9.1, white count 10.8, hemoglobin 15.1, hematocrit 45, platelet count 272 Troponin 70 >> 110>>172 Respiratory viral panel is negative Chest x-ray reviewed by me shows Mild bronchial thickening with borderline hyperinflation  There are no new results to review at this time.  Assessment and Plan: * NSTEMI (non-ST elevated myocardial infarction) [facility]) Patient presents to the ER for evaluation of chest pain with radiation to the back and neck associated with diaphoresis Twelve-lead EKG shows new ST depressions in the lateral leads Patient has an uptrending  troponin level Continue heparin drip initiated in the ER Place patient on aspirin, metoprolol and continue Crestor Obtain 2D echocardiogram to assess LVEF and rule out regional wall motion abnormality Trend troponin levels Consult cardiology  Diabetes mellitus ([place]) Continue Levemir but decrease dose to 15 units daily Sliding scale insulin with blood sugar checks before meals and at bedtime Maintain consistent carbohydrate diet  Morbid obesity with BMI of 45.0-49.9, adult (HCC) BMI 01.7 Complicates overall prognosis and care Lifestyle modification and exercise has been discussed with patient in detail  Hypertension-resolved as of 08/18/2011 Continue losartan and metoprolol      Advance Care Planning:   Code Status: Full Code   Consults: Cardiology  Family Communication: Greater than 50% of time was spent discussing patient's condition and plan of care with him at the bedside.  All questions and concerns have been addressed.  He verbalizes understanding and agrees to the plan.  Severity of Illness: The appropriate patient status for this patient is INPATIENT. Inpatient status is judged to be reasonable and necessary in order to provide the required intensity of service to ensure the patient's safety. The patient's presenting symptoms, physical exam findings, and initial radiographic and laboratory data in the context of their chronic comorbidities is felt to place them at high risk for further clinical deterioration. Furthermore, it is not anticipated that the patient will be medically stable for discharge from the hospital within 2 midnights of admission.   * I certify that at the point of admission it is my clinical judgment that the patient will require inpatient hospital care spanning beyond 2 midnights from the point of admission due to high intensity of service, high risk for further deterioration and high frequency of surveillance required.*  Author: Collier Bullock,  MD 06/24/2022 8:59 AM  For on call review [email or web address].

## 2022-06-24 NOTE — ED Notes (Signed)
Critical Troponin 110 called by lab and result given to Dr. Tamala Julian.

## 2022-06-24 NOTE — Assessment & Plan Note (Addendum)
With cardiac cath on 06/25/2022 patient had a stent placed as per nursing staff.  Official report still pending.  Patient on aspirin, Brilinta, Crestor, metoprolol and losartan.  Echocardiogram shows an EF of 60 to 65% with moderate left ventricular hypertrophy.

## 2022-06-24 NOTE — Assessment & Plan Note (Signed)
BMI 95.2 Complicates overall prognosis and care Lifestyle modification and exercise has been discussed with patient in detail

## 2022-06-24 NOTE — Progress Notes (Signed)
ANTICOAGULATION CONSULT NOTE - Initial Consult  Pharmacy Consult for Heparin  Indication: chest pain/ACS  Allergies  Allergen Reactions   Hctz [Hydrochlorothiazide] Other (See Comments)    Extreme Cramping   Metformin And Related Diarrhea    Patient Measurements: Height: '5\' 11"'$  (180.3 cm) Weight: (!) 140.6 kg (310 lb) IBW/kg (Calculated) : 75.3 Heparin Dosing Weight: 108.1 kg   Vital Signs: Temp: 98.4 F (36.9 C) (12/11 0326) Temp Source: Oral (12/11 0326) BP: 151/75 (12/11 0326) Pulse Rate: 88 (12/11 0326)  Labs: Recent Labs    06/23/22 2217 06/24/22 0327  HGB 15.1  --   HCT 45.5  --   PLT 272  --   CREATININE 0.81  --   TROPONINIHS 70* 110*    Estimated Creatinine Clearance: 142.6 mL/min (by C-G formula based on SCr of 0.81 mg/dL).   Medical History: Past Medical History:  Diagnosis Date   Diabetes mellitus    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 type 2 diabetes mellitus, limited to breakdown of skin ([place]) 04/23/2019   Fracture of one rib, left side, subsequent encounter for fracture with routine healing 08/27/2020   Hyperlipidemia    Hypertension    Normal cardiac stress test 2012   [facility]    Medications:  (Not in a hospital admission)   Assessment: Pharmacy consulted to dose heparin in this 56 year old male admitted with ACS/NSTEMI , no prior anticoag noted.  CrCl = 142.6 ml/min   Goal of Therapy:  Heparin level 0.3-0.7 units/ml Monitor platelets by anticoagulation protocol: Yes   Plan:  Give 4000 units bolus x 1 Start heparin infusion at 1400 units/hr Check anti-Xa level in 6 hours and daily while on heparin Continue to monitor H&H and platelets  Connor Ross 06/24/2022,6:21 AM

## 2022-06-24 NOTE — ED Notes (Signed)
Attempted to assess patient however patient using toilet at this time.

## 2022-06-24 NOTE — Progress Notes (Signed)
ANTICOAGULATION CONSULT NOTE - Initial Consult  Pharmacy Consult for Heparin  Indication: chest pain/ACS  Allergies  Allergen Reactions   Statins     Other Reaction(s): arthralgia (joint pain) Patient able to tolerate Crestor 10 mg   Hctz [Hydrochlorothiazide] Other (See Comments)    Extreme Cramping   Metformin And Related Diarrhea    Patient Measurements: Height: '5\' 11"'$  (180.3 cm) Weight: (!) 140.6 kg (310 lb) IBW/kg (Calculated) : 75.3 Heparin Dosing Weight: 108.1 kg   Vital Signs: Temp: 98.1 F (36.7 C) (12/11 1420) Temp Source: Oral (12/11 0715) BP: 132/86 (12/11 1420) Pulse Rate: 84 (12/11 1420)  Labs: Recent Labs    06/23/22 2217 06/24/22 0327 06/24/22 0624 06/24/22 0625 06/24/22 0841 06/24/22 1300  HGB 15.1  --   --   --   --   --   HCT 45.5  --   --   --   --   --   PLT 272  --   --   --   --   --   APTT  --   --   --  29  --   --   LABPROT  --   --   --  12.9  --   --   INR  --   --   --  1.0  --   --   HEPARINUNFRC  --   --   --   --   --  <0.10*  CREATININE 0.81  --   --   --   --   --   TROPONINIHS 70* 110* 172*  --  165*  --      Estimated Creatinine Clearance: 142.6 mL/min (by C-G formula based on SCr of 0.81 mg/dL).   Medical History: Past Medical History:  Diagnosis Date   Diabetes mellitus    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 type 2 diabetes mellitus, limited to breakdown of skin ([place]) 04/23/2019   Fracture of one rib, left side, subsequent encounter for fracture with routine healing 08/27/2020   Hyperlipidemia    Hypertension    Normal cardiac stress test 2012   [facility]    Medications:  (Not in a hospital admission)  Assessment: Pharmacy consulted to dose heparin in this 56 year old male admitted with ACS/NSTEMI , no prior anticoag noted.  CrCl = 142.6 ml/min   Goal of Therapy:  Heparin level 0.3-0.7 units/ml Monitor platelets by anticoagulation protocol: Yes   12/11 1300 HL = <0.10 Subtherapeutic @ 1400  units/hr Plan:  -Give heparin bolus of 3000 units IV x 1 -Increase heparin drip rate to 1700 units/hr -Recheck HL in 6 hours after rate change -Daily CBC while on heparin drip.  Lorin Picket, [name] 06/24/2022,3:03 PM

## 2022-06-24 NOTE — Assessment & Plan Note (Addendum)
Hemoglobin A1c elevated 8.0.  Patient on 15 units of Levemir insulin plus sliding scale.  LDL 88 on Crestor.  Triglycerides also elevated in the 300s.

## 2022-06-24 NOTE — Progress Notes (Signed)
ANTICOAGULATION CONSULT NOTE  Pharmacy Consult for Heparin  Indication: chest pain/ACS  Allergies  Allergen Reactions   Statins     Other Reaction(s): arthralgia (joint pain) Patient able to tolerate Crestor 10 mg   Hctz [Hydrochlorothiazide] Other (See Comments)    Extreme Cramping   Metformin And Related Diarrhea    Patient Measurements: Height: '5\' 11"'$  (180.3 cm) Weight: (!) 140.6 kg (310 lb) IBW/kg (Calculated) : 75.3 Heparin Dosing Weight: 108.1 kg   Vital Signs: Temp: 98.1 F (36.7 C) (12/11 1420) Temp Source: Oral (12/11 0715) BP: 132/86 (12/11 1420) Pulse Rate: 84 (12/11 1420)  Labs: Recent Labs    06/23/22 2217 06/24/22 0327 06/24/22 0624 06/24/22 0625 06/24/22 0841 06/24/22 1300  HGB 15.1  --   --   --   --   --   HCT 45.5  --   --   --   --   --   PLT 272  --   --   --   --   --   APTT  --   --   --  29  --   --   LABPROT  --   --   --  12.9  --   --   INR  --   --   --  1.0  --   --   HEPARINUNFRC  --   --   --   --   --  <0.10*  CREATININE 0.81  --   --   --   --   --   TROPONINIHS 70* 110* 172*  --  165*  --      Estimated Creatinine Clearance: 142.6 mL/min (by C-G formula based on SCr of 0.81 mg/dL).   Medical History: Past Medical History:  Diagnosis Date   Diabetes mellitus    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 type 2 diabetes mellitus, limited to breakdown of skin ([place]) 04/23/2019   Fracture of one rib, left side, subsequent encounter for fracture with routine healing 08/27/2020   Hyperlipidemia    Hypertension    Normal cardiac stress test 2012   [facility]    Assessment: Pharmacy consulted to dose heparin in this 56 year old male admitted with ACS/NSTEMI , no prior anticoag noted.   Goal of Therapy:  Heparin level 0.3-0.7 units/ml Monitor platelets by anticoagulation protocol: Yes   Plan:  -Give heparin bolus of 3000 units IV x 1 -Increase heparin drip rate to 2100 units/hr -Recheck heparin level` in 6 hours after  rate change -Daily CBC while on heparin drip.  Dallie Piles, [name] 06/24/2022,3:20 PM

## 2022-06-24 NOTE — Assessment & Plan Note (Signed)
-  Continue losartan and metoprolol 

## 2022-06-25 ENCOUNTER — Other Ambulatory Visit: Payer: Self-pay

## 2022-06-25 ENCOUNTER — Encounter: Payer: Self-pay | Admitting: Internal Medicine

## 2022-06-25 ENCOUNTER — Encounter
Admission: EM | Disposition: A | Discharge status: Home / Self Care | Payer: Self-pay | Source: Home / Self Care | Attending: Internal Medicine

## 2022-06-25 DIAGNOSIS — E1169 Type 2 diabetes mellitus with other specified complication: Secondary | ICD-10-CM | POA: Diagnosis not present

## 2022-06-25 DIAGNOSIS — G4733 Obstructive sleep apnea (adult) (pediatric): Secondary | ICD-10-CM

## 2022-06-25 DIAGNOSIS — M48061 Spinal stenosis, lumbar region without neurogenic claudication: Secondary | ICD-10-CM

## 2022-06-25 DIAGNOSIS — I214 Non-ST elevation (NSTEMI) myocardial infarction: Secondary | ICD-10-CM | POA: Diagnosis not present

## 2022-06-25 DIAGNOSIS — I10 Essential (primary) hypertension: Secondary | ICD-10-CM | POA: Diagnosis not present

## 2022-06-25 DIAGNOSIS — M5416 Radiculopathy, lumbar region: Secondary | ICD-10-CM

## 2022-06-25 DIAGNOSIS — Z6841 Body Mass Index (BMI) 40.0 and over, adult: Secondary | ICD-10-CM

## 2022-06-25 DIAGNOSIS — E785 Hyperlipidemia, unspecified: Secondary | ICD-10-CM

## 2022-06-25 HISTORY — PX: CORONARY STENT INTERVENTION: CATH118234

## 2022-06-25 HISTORY — PX: LEFT HEART CATH AND CORONARY ANGIOGRAPHY: CATH118249

## 2022-06-25 LAB — CBC
HCT: 43.4 % (ref 39.0–52.0)
Hemoglobin: 14.3 g/dL (ref 13.0–17.0)
MCH: 30.3 pg (ref 26.0–34.0)
MCHC: 32.9 g/dL (ref 30.0–36.0)
MCV: 91.9 fL (ref 80.0–100.0)
Platelets: 259 10*3/uL (ref 150–400)
RBC: 4.72 MIL/uL (ref 4.22–5.81)
RDW: 13.2 % (ref 11.5–15.5)
WBC: 12.7 10*3/uL — ABNORMAL HIGH (ref 4.0–10.5)
nRBC: 0 % (ref 0.0–0.2)

## 2022-06-25 LAB — HEPARIN LEVEL (UNFRACTIONATED): Heparin Unfractionated: 0.25 IU/mL — ABNORMAL LOW (ref 0.30–0.70)

## 2022-06-25 LAB — BASIC METABOLIC PANEL
Anion gap: 7 (ref 5–15)
BUN: 16 mg/dL (ref 6–20)
CO2: 25 mmol/L (ref 22–32)
Calcium: 8.7 mg/dL — ABNORMAL LOW (ref 8.9–10.3)
Chloride: 105 mmol/L (ref 98–111)
Creatinine, Ser: 0.64 mg/dL (ref 0.61–1.24)
GFR, Estimated: >60 mL/min (ref >60)
Glucose, Bld: 189 mg/dL — ABNORMAL HIGH (ref 70–99)
Potassium: 3.5 mmol/L (ref 3.5–5.1)
Sodium: 137 mmol/L (ref 135–145)

## 2022-06-25 LAB — GLUCOSE, CAPILLARY: Glucose-Capillary: 188 mg/dL — ABNORMAL HIGH (ref 70–99)

## 2022-06-25 LAB — POCT ACTIVATED CLOTTING TIME
Activated Clotting Time: 217 seconds
Activated Clotting Time: 222 seconds
Activated Clotting Time: 314 seconds

## 2022-06-25 LAB — CBG MONITORING, ED
Glucose-Capillary: 150 mg/dL — ABNORMAL HIGH (ref 70–99)
Glucose-Capillary: 185 mg/dL — ABNORMAL HIGH (ref 70–99)

## 2022-06-25 LAB — LIPID PANEL
Cholesterol: 182 mg/dL (ref 0–200)
HDL: 26 mg/dL — ABNORMAL LOW (ref >40)
LDL Cholesterol: 88 mg/dL (ref 0–99)
Total CHOL/HDL Ratio: 7 RATIO
Triglycerides: 340 mg/dL — ABNORMAL HIGH (ref <150)
VLDL: 68 mg/dL — ABNORMAL HIGH (ref 0–40)

## 2022-06-25 LAB — TROPONIN I (HIGH SENSITIVITY): Troponin I (High Sensitivity): 158 ng/L (ref <18)

## 2022-06-25 LAB — PLATELET COUNT: Platelets: 270 10*3/uL (ref 150–400)

## 2022-06-25 SURGERY — LEFT HEART CATH AND CORONARY ANGIOGRAPHY
Anesthesia: Moderate Sedation

## 2022-06-25 MED ORDER — HEPARIN (PORCINE) IN NACL 1000-0.9 UT/500ML-% IV SOLN
INTRAVENOUS | Status: AC
  Filled 2022-06-25: qty 1000

## 2022-06-25 MED ORDER — TICAGRELOR 90 MG PO TABS
ORAL_TABLET | ORAL | Status: DC | PRN
  Administered 2022-06-25: 180 mg via ORAL

## 2022-06-25 MED ORDER — LIDOCAINE HCL 1 % IJ SOLN
INTRAMUSCULAR | Status: AC
  Filled 2022-06-25: qty 20

## 2022-06-25 MED ORDER — PNEUMOCOCCAL 20-VAL CONJ VACC 0.5 ML IM SUSY: 0.5000 mL | PREFILLED_SYRINGE | INTRAMUSCULAR | Status: DC | PRN

## 2022-06-25 MED ORDER — SODIUM CHLORIDE 0.9% FLUSH
3.0000 mL | Freq: Two times a day (BID) | INTRAVENOUS | Status: DC
  Administered 2022-06-27: 3 mL via INTRAVENOUS

## 2022-06-25 MED ORDER — TIROFIBAN HCL IV 12.5 MG/250 ML
INTRAVENOUS | Status: AC
  Filled 2022-06-25: qty 250

## 2022-06-25 MED ORDER — ASPIRIN 81 MG PO CHEW
CHEWABLE_TABLET | ORAL | Status: AC
  Filled 2022-06-25: qty 3

## 2022-06-25 MED ORDER — ASPIRIN 81 MG PO CHEW
81.0000 mg | CHEWABLE_TABLET | Freq: Every day | ORAL | Status: DC
  Administered 2022-06-26: 81 mg via ORAL
  Filled 2022-06-25: qty 1

## 2022-06-25 MED ORDER — HEPARIN SODIUM (PORCINE) 1000 UNIT/ML IJ SOLN
INTRAMUSCULAR | Status: AC
  Filled 2022-06-25: qty 10

## 2022-06-25 MED ORDER — VERAPAMIL HCL 2.5 MG/ML IV SOLN
INTRAVENOUS | Status: DC | PRN
  Administered 2022-06-25: 2.5 mg via INTRA_ARTERIAL

## 2022-06-25 MED ORDER — FENTANYL CITRATE (PF) 100 MCG/2ML IJ SOLN
INTRAMUSCULAR | Status: AC
  Filled 2022-06-25: qty 2

## 2022-06-25 MED ORDER — ASPIRIN 81 MG PO CHEW
CHEWABLE_TABLET | ORAL | Status: DC | PRN
  Administered 2022-06-25: 243 mg via ORAL

## 2022-06-25 MED ORDER — SODIUM CHLORIDE 0.9 % WEIGHT BASED INFUSION
1.0000 mL/kg/h | INTRAVENOUS | Status: AC
  Administered 2022-06-25: 1 mL/kg/h via INTRAVENOUS

## 2022-06-25 MED ORDER — SODIUM CHLORIDE 0.9 % IV SOLN: 250.0000 mL | INTRAVENOUS | Status: DC | PRN

## 2022-06-25 MED ORDER — TIROFIBAN HCL IV 12.5 MG/250 ML
INTRAVENOUS | Status: AC | PRN
  Administered 2022-06-25: .15 ug/kg/min via INTRAVENOUS

## 2022-06-25 MED ORDER — LIDOCAINE HCL (PF) 1 % IJ SOLN
INTRAMUSCULAR | Status: DC | PRN
  Administered 2022-06-25: 2 mL

## 2022-06-25 MED ORDER — HEPARIN SODIUM (PORCINE) 1000 UNIT/ML IJ SOLN
INTRAMUSCULAR | Status: DC | PRN
  Administered 2022-06-25 (×2): 5000 [IU] via INTRAVENOUS
  Administered 2022-06-25: 7000 [IU] via INTRAVENOUS

## 2022-06-25 MED ORDER — TIROFIBAN HCL IN NACL 5-0.9 MG/100ML-% IV SOLN
0.1500 ug/kg/min | INTRAVENOUS | Status: AC
  Administered 2022-06-25 – 2022-06-26 (×2): 0.15 ug/kg/min via INTRAVENOUS
  Filled 2022-06-25 (×3): qty 100

## 2022-06-25 MED ORDER — MIDAZOLAM HCL 2 MG/2ML IJ SOLN
INTRAMUSCULAR | Status: AC
  Filled 2022-06-25: qty 2

## 2022-06-25 MED ORDER — FENTANYL CITRATE (PF) 100 MCG/2ML IJ SOLN
INTRAMUSCULAR | Status: DC | PRN
  Administered 2022-06-25: 25 ug via INTRAVENOUS

## 2022-06-25 MED ORDER — MIDAZOLAM HCL 2 MG/2ML IJ SOLN
INTRAMUSCULAR | Status: DC | PRN
  Administered 2022-06-25: 1 mg via INTRAVENOUS

## 2022-06-25 MED ORDER — ONDANSETRON HCL 4 MG/2ML IJ SOLN: 4.0000 mg | Freq: Four times a day (QID) | INTRAMUSCULAR | Status: DC | PRN

## 2022-06-25 MED ORDER — TICAGRELOR 90 MG PO TABS
ORAL_TABLET | ORAL | Status: AC
  Filled 2022-06-25: qty 2

## 2022-06-25 MED ORDER — ACETAMINOPHEN 325 MG PO TABS: 650.0000 mg | ORAL_TABLET | ORAL | Status: DC | PRN

## 2022-06-25 MED ORDER — IOHEXOL 300 MG/ML  SOLN
INTRAMUSCULAR | Status: DC | PRN
  Administered 2022-06-25: 285 mL

## 2022-06-25 MED ORDER — TIROFIBAN (AGGRASTAT) BOLUS VIA INFUSION
INTRAVENOUS | Status: DC | PRN
  Administered 2022-06-25: 3500 ug via INTRAVENOUS

## 2022-06-25 MED ORDER — INFLUENZA VAC SPLIT QUAD 0.5 ML IM SUSY: 0.5000 mL | PREFILLED_SYRINGE | INTRAMUSCULAR | Status: DC | PRN

## 2022-06-25 MED ORDER — HYDRALAZINE HCL 20 MG/ML IJ SOLN: 10.0000 mg | INTRAMUSCULAR | Status: AC | PRN

## 2022-06-25 MED ORDER — SODIUM CHLORIDE 0.9% FLUSH: 3.0000 mL | INTRAVENOUS | Status: DC | PRN

## 2022-06-25 MED ORDER — VERAPAMIL HCL 2.5 MG/ML IV SOLN
INTRAVENOUS | Status: AC
  Filled 2022-06-25: qty 2

## 2022-06-25 MED ORDER — TICAGRELOR 90 MG PO TABS
90.0000 mg | ORAL_TABLET | Freq: Two times a day (BID) | ORAL | Status: DC
  Administered 2022-06-25 – 2022-06-26 (×2): 90 mg via ORAL
  Filled 2022-06-25 (×2): qty 1

## 2022-06-25 MED ORDER — HEPARIN (PORCINE) IN NACL 1000-0.9 UT/500ML-% IV SOLN
INTRAVENOUS | Status: DC | PRN
  Administered 2022-06-25 (×2): 500 mL

## 2022-06-25 MED ORDER — LABETALOL HCL 5 MG/ML IV SOLN: 10.0000 mg | INTRAVENOUS | Status: AC | PRN

## 2022-06-25 MED ORDER — ORAL CARE MOUTH RINSE: 15.0000 mL | OROMUCOSAL | Status: DC | PRN

## 2022-06-25 MED ORDER — HEPARIN BOLUS VIA INFUSION
1500.0000 [IU] | Freq: Once | INTRAVENOUS | Status: AC
  Administered 2022-06-25: 1500 [IU] via INTRAVENOUS
  Filled 2022-06-25: qty 1500

## 2022-06-25 SURGICAL SUPPLY — 18 items
BALLN TREK RX 2.5X15 (BALLOONS) ×1
BALLOON TREK RX 2.5X15 (BALLOONS) IMPLANT
CATH 5FR JL3.5 JR4 ANG PIG MP (CATHETERS) IMPLANT
CATH VISTA GUIDE 6FR XB3.5 SH (CATHETERS) IMPLANT
DEVICE RAD TR BAND REGULAR (VASCULAR PRODUCTS) IMPLANT
DRAPE BRACHIAL (DRAPES) IMPLANT
GLIDESHEATH SLEND SS 6F .021 (SHEATH) IMPLANT
GUIDEWIRE INQWIRE 1.5J.035X260 (WIRE) IMPLANT
INQWIRE 1.5J .035X260CM (WIRE) ×1
KIT ENCORE 26 ADVANTAGE (KITS) IMPLANT
PACK CARDIAC CATH (CUSTOM PROCEDURE TRAY) ×1 IMPLANT
PROTECTION STATION PRESSURIZED (MISCELLANEOUS) ×1
SET ATX SIMPLICITY (MISCELLANEOUS) IMPLANT
STATION PROTECTION PRESSURIZED (MISCELLANEOUS) IMPLANT
STENT ONYX FRONTIER 2.75X38 (Permanent Stent) IMPLANT
STENT ONYX FRONTIER 3.0X18 (Permanent Stent) IMPLANT
TUBING CIL FLEX 10 FLL-RA (TUBING) IMPLANT
WIRE G HI TQ BMW 190 (WIRE) IMPLANT

## 2022-06-25 NOTE — Inpatient Diabetes Management (Signed)
Inpatient Diabetes Program Recommendations  AACE/ADA: New Consensus Statement on Inpatient Glycemic Control (2015)  Target Ranges:  Prepandial:   less than 140 mg/dL      Peak postprandial:   less than 180 mg/dL (1-2 hours)      Critically ill patients:  140 - 180 mg/dL   Lab Results  Component Value Date   GLUCAP 185 (H) 06/25/2022   HGBA1C 8.0 (H) 03/27/2022    Review of Glycemic Control  Latest Reference Range & Units 06/25/22 00:24 06/25/22 10:05  Glucose-Capillary 70 - 99 mg/dL 150 (H) 185 (H)  (H): Data is abnormally high  Diabetes history: DM2 Outpatient Diabetes medications:  Tresiba 86 units QD Novolog 25 units with BF, 25 units with lunch and 30 with supper Jardiance 25 mg QD Ozempic 2 mg weekly Freestyle Libre 2 CGM Current orders for Inpatient glycemic control:  Levemir 50 units QD Novolog 0-20 units TID  Inpatient Diabetes Program Recommendations:    Currently NPO  Please consider:  Levemir 40 units QHS Novolog 0-15 units TID and 0-5 QHS  Will continue to follow while inpatient.  Thank you, Reche Dixon, MSN, [place] Diabetes Coordinator Inpatient Diabetes Program [phone] (team pager from 8a-5p)

## 2022-06-25 NOTE — Progress Notes (Signed)
[facility] Cardiology    SUBJECTIVE: Patient resting comfortably sleeping with CPAP mask in place no worsening chest pain or shortness of breath agreed to proceed with cardiac cath understanding the risk and benefits   Vitals:   06/25/22 0047 06/25/22 0101 06/25/22 0200 06/25/22 0400  BP:   114/74 122/65  Pulse:   70 81  Resp:   19 (!) 22  Temp:  98.5 F (36.9 C)    TempSrc:  Oral    SpO2:   95% 98%  Weight: (!) 140 kg     Height:         Intake/Output Summary (Last 24 hours) at 06/25/2022 0641 Last data filed at 06/25/2022 0102 Gross per 24 hour  Intake 385.29 ml  Output --  Net 385.29 ml      PHYSICAL EXAM  General: Well developed, well nourished, in no acute distress HEENT:  Normocephalic and atramatic Neck:  No JVD.  Lungs: Clear bilaterally to auscultation and percussion. Heart: HRRR . Normal S1 and S2 without gallops or murmurs.  Abdomen: Bowel sounds are positive, abdomen soft and non-tender  Msk:  Back normal, normal gait. Normal strength and tone for age. Extremities: No clubbing, cyanosis or edema.   Neuro: Alert and oriented X 3. Psych:  Good affect, responds appropriately   LABS: Basic Metabolic Panel: Recent Labs    06/23/22 2217  NA 138  K 3.5  CL 104  CO2 23  GLUCOSE 231*  BUN 14  CREATININE 0.81  CALCIUM 9.1   Liver Function Tests: No results for input(s): "AST", "ALT", "ALKPHOS", "BILITOT", "PROT", "ALBUMIN" in the last 72 hours. No results for input(s): "LIPASE", "AMYLASE" in the last 72 hours. CBC: Recent Labs    06/23/22 2217  WBC 10.8*  HGB 15.1  HCT 45.5  MCV 91.0  PLT 272   Cardiac Enzymes: No results for input(s): "CKTOTAL", "CKMB", "CKMBINDEX", "TROPONINI" in the last 72 hours. BNP: Invalid input(s): "POCBNP" D-Dimer: No results for input(s): "DDIMER" in the last 72 hours. Hemoglobin A1C: No results for input(s): "HGBA1C" in the last 72 hours. Fasting Lipid Panel: No results for input(s): "CHOL", "HDL", "LDLCALC",  "TRIG", "CHOLHDL", "LDLDIRECT" in the last 72 hours. Thyroid Function Tests: No results for input(s): "TSH", "T4TOTAL", "T3FREE", "THYROIDAB" in the last 72 hours.  Invalid input(s): "FREET3" Anemia Panel: No results for input(s): "VITAMINB12", "FOLATE", "FERRITIN", "TIBC", "IRON", "RETICCTPCT" in the last 72 hours.  ECHOCARDIOGRAM COMPLETE  Result Date: 06/24/2022    ECHOCARDIOGRAM REPORT   Patient Name:   Connor Ross [facility] Date of Exam: 06/24/2022 Medical Rec #:  [id number]             Height:       71.0 in Accession #:    [id number]            Weight:       310.0 lb Date of Birth:  02/04/1966             BSA:          2.540 m Patient Age:    56 years              BP:           128/83 mmHg Patient Gender: M                     HR:           81 bpm. Exam Location:  [facility] Procedure: 2D Echo, Color Doppler, Cardiac Doppler and  Intracardiac            Opacification Agent Indications:     I21.4 NSTEMI  History:         Patient has no prior history of Echocardiogram examinations.                  Risk Factors:Hypertension, Diabetes and Dyslipidemia.  Sonographer:     Charmayne Sheer Referring Phys:  [id number] Palmer TANG Diagnosing Phys: Yolonda Kida MD  Sonographer Comments: Technically difficult study due to poor echo windows. Image acquisition challenging due to patient body habitus. IMPRESSIONS  1. Left ventricular ejection fraction, by estimation, is 60 to 65%. The left ventricle has normal function. The left ventricle has no regional wall motion abnormalities. The left ventricular internal cavity size was moderately dilated. There is moderate  left ventricular hypertrophy. Left ventricular diastolic parameters are consistent with Grade I diastolic dysfunction (impaired relaxation).  2. Right ventricular systolic function is normal. The right ventricular size is normal.  3. Left atrial size was mildly dilated.  4. Right atrial size was mildly dilated.  5. The mitral valve is normal in structure.  No evidence of mitral valve regurgitation.  6. The aortic valve is grossly normal. Aortic valve regurgitation is mild to moderate. FINDINGS  Left Ventricle: Left ventricular ejection fraction, by estimation, is 60 to 65%. The left ventricle has normal function. The left ventricle has no regional wall motion abnormalities. Definity contrast agent was given IV to delineate the left ventricular  endocardial borders. The left ventricular internal cavity size was moderately dilated. There is moderate left ventricular hypertrophy. Left ventricular diastolic parameters are consistent with Grade I diastolic dysfunction (impaired relaxation). Right Ventricle: The right ventricular size is normal. No increase in right ventricular wall thickness. Right ventricular systolic function is normal. Left Atrium: Left atrial size was mildly dilated. Right Atrium: Right atrial size was mildly dilated. Pericardium: There is no evidence of pericardial effusion. Mitral Valve: The mitral valve is normal in structure. No evidence of mitral valve regurgitation. Tricuspid Valve: The tricuspid valve is normal in structure. Tricuspid valve regurgitation is trivial. Aortic Valve: The aortic valve is grossly normal. Aortic valve regurgitation is mild to moderate. Aortic valve mean gradient measures 5.0 mmHg. Aortic valve peak gradient measures 9.5 mmHg. Aortic valve area, by VTI measures 3.38 cm. Pulmonic Valve: The pulmonic valve was normal in structure. Pulmonic valve regurgitation is not visualized. Aorta: The ascending aorta was not well visualized. IAS/Shunts: No atrial level shunt detected by color flow Doppler.  LEFT VENTRICLE PLAX 2D LVIDd:         5.20 cm   Diastology LVIDs:         3.40 cm   LV e' medial:    6.09 cm/s LV PW:         1.90 cm   LV E/e' medial:  13.0 LV IVS:        1.20 cm   LV e' lateral:   6.42 cm/s LVOT diam:     2.40 cm   LV E/e' lateral: 12.3 LV SV:         86 LV SV Index:   34 LVOT Area:     4.52 cm  LEFT ATRIUM            Index LA diam:      4.00 cm 1.57 cm/m LA Vol (A4C): 98.5 ml 38.78 ml/m  AORTIC VALVE  PULMONIC VALVE AV Area (Vmax):    3.03 cm      PV Vmax:       0.99 m/s AV Area (Vmean):   2.99 cm      PV Vmean:      70.400 cm/s AV Area (VTI):     3.38 cm      PV VTI:        0.167 m AV Vmax:           154.00 cm/s   PV Peak grad:  3.9 mmHg AV Vmean:          110.000 cm/s  PV Mean grad:  2.0 mmHg AV VTI:            0.256 m AV Peak Grad:      9.5 mmHg AV Mean Grad:      5.0 mmHg LVOT Vmax:         103.00 cm/s LVOT Vmean:        72.600 cm/s LVOT VTI:          0.191 m LVOT/AV VTI ratio: 0.75  AORTA Ao Root diam: 4.00 cm MITRAL VALVE MV Area (PHT): 3.58 cm     SHUNTS MV Decel Time: 212 msec     Systemic VTI:  0.19 m MV E velocity: 79.10 cm/s   Systemic Diam: 2.40 cm MV A velocity: 129.00 cm/s MV E/A ratio:  0.61 Marvion Bastidas D Khloee Garza MD Electronically signed by Yolonda Kida MD Signature Date/Time: 06/24/2022/4:06:35 PM    Final    DG Chest 2 View  Result Date: 06/23/2022 CLINICAL DATA:  Chest pain radiating to the back. EXAM: CHEST - 2 VIEW COMPARISON:  Radiograph 01/16/2013 FINDINGS: The heart is normal in size. Normal mediastinal contours. Mild bronchial thickening with borderline hyperinflation. No focal airspace disease, pleural effusion or pneumothorax. IMPRESSION: Mild bronchial thickening with borderline hyperinflation, can be seen with bronchitis or asthma. Electronically Signed   By: Keith Rake M.D.   On: 06/23/2022 22:37     Echo preserved left ventricular function EF around 60%  TELEMETRY: Normal sinus rhythm rate of around 80 nonspecific findings:  ASSESSMENT AND PLAN:  Principal Problem:   NSTEMI (non-ST elevated myocardial infarction) [facility]) Active Problems:   Morbid obesity with BMI of 45.0-49.9, adult ([place])   Diabetes mellitus ([place])    Plan Agree with admission follow-up troponins follow-up EKGs follow-up symptoms Echocardiogram suggestive of preserved left  ventricular function Recommend continue CPAP for obstructive sleep apnea.  Patient placed of 1 at last night successfully Patient also recommend that we proceed with significant weight loss exercise portion control Suggest cardiac cath today for evaluation of possible unstable angina    Yolonda Kida, MD 06/25/2022 6:41 AM

## 2022-06-25 NOTE — Assessment & Plan Note (Deleted)
LDL 88.  Now on increased dose of Crestor 40 mg.

## 2022-06-25 NOTE — Assessment & Plan Note (Signed)
Continue CPAP.  

## 2022-06-25 NOTE — Hospital Course (Signed)
56 year old man with diabetes, hypertension, hyperlipidemia, morbid obesity presented to the emergency room with 3 episodes of chest pain.  Troponin peaked at 292.  Patient was started on heparin drip.  He was kept on his Crestor but increased dose and continued on his aspirin.  Metoprolol was started.  He was brought for the cardiac Cath Lab on 06/25/2022 and had a stent but official cath report still pending.

## 2022-06-25 NOTE — Progress Notes (Signed)
  Progress Note   Patient: Connor Ross YQM:[id number] DOB: Jan 07, 1966 DOA: 06/24/2022     1 DOS: the patient was seen and examined on 06/25/2022   Brief hospital course: 56 year old man with diabetes, hypertension, hyperlipidemia, morbid obesity presented to the emergency room with 3 episodes of chest pain.  Troponin peaked at 292.  Patient was started on heparin drip.  He was kept on his Crestor but increased dose and continued on his aspirin.  Metoprolol was started.  He was brought for the cardiac Cath Lab on 06/25/2022 and had a stent but official cath report still pending.  Assessment and Plan: * NSTEMI (non-ST elevated myocardial infarction) ([place]) With cardiac cath on 06/25/2022 patient had a stent placed as per nursing staff.  Official report still pending.  Patient on aspirin, Brilinta, Crestor, metoprolol and losartan.  Echocardiogram shows an EF of 60 to 65% with moderate left ventricular hypertrophy.  Type 2 diabetes mellitus with hyperlipidemia (HCC) Hemoglobin A1c elevated 8.0.  Patient on 15 units of Levemir insulin plus sliding scale.  LDL 88 on Crestor.  Triglycerides also elevated in the 300s.  Hypertension Continue metoprolol and losartan  OSA on CPAP Continue CPAP  Spinal stenosis of lumbar region with radiculopathy Chronic back pain.  Morbid obesity with BMI of 45.0-49.9, adult (HCC) BMI 43.05.          Subjective: Patient had chest pain 3 times yesterday and once in the hospital.  Each time affecting his arms the pain radiated to his back he had sweating.  He was started on heparin drip and given aspirin for suspected NSTEMI.  Physical Exam: Vitals:   06/25/22 1515 06/25/22 1530 06/25/22 1545 06/25/22 1600  BP: 129/73 (!) 86/67  (!) 130/56  Pulse: 73 74 72 79  Resp: '15 19 19 20  '$ Temp:      TempSrc:      SpO2: 95% 95% 95% 95%  Weight:      Height:       Physical Exam HENT:     Head: Normocephalic.     Mouth/Throat:     Pharynx: No  oropharyngeal exudate.  Eyes:     General: Lids are normal.     Conjunctiva/sclera: Conjunctivae normal.  Cardiovascular:     Rate and Rhythm: Normal rate and regular rhythm.     Heart sounds: Normal heart sounds, S1 normal and S2 normal.  Pulmonary:     Breath sounds: Examination of the right-lower field reveals decreased breath sounds. Examination of the left-lower field reveals decreased breath sounds. Decreased breath sounds present. No wheezing, rhonchi or rales.  Abdominal:     Palpations: Abdomen is soft.     Tenderness: There is no abdominal tenderness.  Musculoskeletal:     Right lower leg: Swelling present.     Left lower leg: Swelling present.  Skin:    General: Skin is warm.     Findings: No rash.  Neurological:     Mental Status: He is alert and oriented to person, place, and time.     Data Reviewed: Troponin peaked at 292, LDL 88, triglycerides 340, creatinine 0.64, white blood cell count 12.7  Family Communication: Family was on the phone while I was in the room  Disposition: Status is: Inpatient Remains inpatient appropriate because: Patient had a stent today  Planned Discharge Destination: Home    Time spent: 35 minutes  Author: Loletha Grayer, MD 06/25/2022 4:18 PM  For on call review [email or web address].

## 2022-06-25 NOTE — Progress Notes (Signed)
ANTICOAGULATION CONSULT NOTE  Pharmacy Consult for Heparin  Indication: chest pain/ACS  Allergies  Allergen Reactions   Statins     Other Reaction(s): arthralgia (joint pain) Patient able to tolerate Crestor 10 mg   Hctz [Hydrochlorothiazide] Other (See Comments)    Extreme Cramping   Metformin And Related Diarrhea    Patient Measurements: Height: '5\' 11"'$  (180.3 cm) Weight: (!) 140 kg (308 lb 10.3 oz) IBW/kg (Calculated) : 75.3 Heparin Dosing Weight: 108.1 kg   Vital Signs: Temp: 98.5 F (36.9 C) (12/12 0101) Temp Source: Oral (12/12 0101) BP: 122/65 (12/12 0400) Pulse Rate: 81 (12/12 0400)  Labs: Recent Labs    06/23/22 2217 06/24/22 0327 06/24/22 0624 06/24/22 0625 06/24/22 0841 06/24/22 1300 06/24/22 1450 06/24/22 2139 06/25/22 0604  HGB 15.1  --   --   --   --   --   --   --  14.3  HCT 45.5  --   --   --   --   --   --   --  43.4  PLT 272  --   --   --   --   --   --   --  259  APTT  --   --   --  29  --   --   --   --   --   LABPROT  --   --   --  12.9  --   --   --   --   --   INR  --   --   --  1.0  --   --   --   --   --   HEPARINUNFRC  --   --   --   --   --  <0.10*  --  0.18* 0.25*  CREATININE 0.81  --   --   --   --   --   --   --   --   TROPONINIHS 70*   < > 172*  --  165*  --  292*  --   --    < > = values in this interval not displayed.     Estimated Creatinine Clearance: 142.3 mL/min (by C-G formula based on SCr of 0.81 mg/dL).   Medical History: Past Medical History:  Diagnosis Date   Diabetes mellitus    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 type 2 diabetes mellitus, limited to breakdown of skin ([place]) 04/23/2019   Fracture of one rib, left side, subsequent encounter for fracture with routine healing 08/27/2020   Hyperlipidemia    Hypertension    Normal cardiac stress test 2012   [facility]    Assessment: Pharmacy consulted to dose heparin in this 56 year old male admitted with ACS/NSTEMI , no prior anticoag noted.   Goal of  Therapy:  Heparin level 0.3-0.7 units/ml Monitor platelets by anticoagulation protocol: Yes   Plan:  -Give heparin bolus of 1500 units IV x 1 -Increase heparin drip rate to 2300 units/hr -Recheck heparin level` in 6 hours after rate change -Daily CBC while on heparin drip.  Renda Rolls, [name], [facility] 06/25/2022 6:54 AM

## 2022-06-25 NOTE — Assessment & Plan Note (Signed)
Continue metoprolol and losartan 

## 2022-06-25 NOTE — Assessment & Plan Note (Signed)
Chronic back pain 

## 2022-06-26 ENCOUNTER — Other Ambulatory Visit (HOSPITAL_COMMUNITY): Payer: Self-pay

## 2022-06-26 ENCOUNTER — Encounter: Payer: Self-pay | Admitting: Internal Medicine

## 2022-06-26 ENCOUNTER — Encounter
Admission: EM | Disposition: A | Discharge status: Home / Self Care | Payer: Self-pay | Source: Home / Self Care | Attending: Internal Medicine

## 2022-06-26 ENCOUNTER — Other Ambulatory Visit: Payer: Self-pay

## 2022-06-26 DIAGNOSIS — I214 Non-ST elevation (NSTEMI) myocardial infarction: Secondary | ICD-10-CM | POA: Diagnosis not present

## 2022-06-26 HISTORY — PX: CORONARY STENT INTERVENTION: CATH118234

## 2022-06-26 LAB — CBC
HCT: 41.9 % (ref 39.0–52.0)
Hemoglobin: 13.9 g/dL (ref 13.0–17.0)
MCH: 30.1 pg (ref 26.0–34.0)
MCHC: 33.2 g/dL (ref 30.0–36.0)
MCV: 90.7 fL (ref 80.0–100.0)
Platelets: 296 10*3/uL (ref 150–400)
RBC: 4.62 MIL/uL (ref 4.22–5.81)
RDW: 13.2 % (ref 11.5–15.5)
WBC: 11.9 10*3/uL — ABNORMAL HIGH (ref 4.0–10.5)
nRBC: 0 % (ref 0.0–0.2)

## 2022-06-26 LAB — GLUCOSE, CAPILLARY
Glucose-Capillary: 174 mg/dL — ABNORMAL HIGH (ref 70–99)
Glucose-Capillary: 206 mg/dL — ABNORMAL HIGH (ref 70–99)
Glucose-Capillary: 155 mg/dL — ABNORMAL HIGH (ref 70–99)
Glucose-Capillary: 198 mg/dL — ABNORMAL HIGH (ref 70–99)

## 2022-06-26 LAB — BASIC METABOLIC PANEL
Anion gap: 6 (ref 5–15)
BUN: 13 mg/dL (ref 6–20)
CO2: 23 mmol/L (ref 22–32)
Calcium: 8.6 mg/dL — ABNORMAL LOW (ref 8.9–10.3)
Chloride: 110 mmol/L (ref 98–111)
Creatinine, Ser: 0.51 mg/dL — ABNORMAL LOW (ref 0.61–1.24)
GFR, Estimated: >60 mL/min (ref >60)
Glucose, Bld: 172 mg/dL — ABNORMAL HIGH (ref 70–99)
Potassium: 3.7 mmol/L (ref 3.5–5.1)
Sodium: 139 mmol/L (ref 135–145)

## 2022-06-26 LAB — CARDIAC CATHETERIZATION: Cath EF Quantitative: 55 %

## 2022-06-26 LAB — POCT ACTIVATED CLOTTING TIME: Activated Clotting Time: 412 seconds

## 2022-06-26 LAB — LIPOPROTEIN A (LPA): Lipoprotein (a): 225.1 nmol/L — ABNORMAL HIGH (ref <75.0)

## 2022-06-26 SURGERY — CORONARY STENT INTERVENTION
Anesthesia: Moderate Sedation

## 2022-06-26 MED ORDER — HEPARIN (PORCINE) IN NACL 1000-0.9 UT/500ML-% IV SOLN
INTRAVENOUS | Status: DC | PRN
  Administered 2022-06-26 (×2): 500 mL

## 2022-06-26 MED ORDER — MIDAZOLAM HCL 2 MG/2ML IJ SOLN
INTRAMUSCULAR | Status: AC
  Filled 2022-06-26: qty 2

## 2022-06-26 MED ORDER — MIDAZOLAM HCL 2 MG/2ML IJ SOLN
INTRAMUSCULAR | Status: DC | PRN
  Administered 2022-06-26 (×2): 1 mg via INTRAVENOUS

## 2022-06-26 MED ORDER — BIVALIRUDIN TRIFLUOROACETATE 250 MG IV SOLR
INTRAVENOUS | Status: AC
  Filled 2022-06-26: qty 250

## 2022-06-26 MED ORDER — VERAPAMIL HCL 2.5 MG/ML IV SOLN
INTRAVENOUS | Status: DC | PRN
  Administered 2022-06-26: 2.5 mg via INTRA_ARTERIAL

## 2022-06-26 MED ORDER — SODIUM CHLORIDE 0.9% FLUSH: 3.0000 mL | INTRAVENOUS | Status: DC | PRN

## 2022-06-26 MED ORDER — SODIUM CHLORIDE 0.9 % WEIGHT BASED INFUSION
1.0000 mL/kg/h | INTRAVENOUS | Status: DC
  Administered 2022-06-26: 1 mL/kg/h via INTRAVENOUS

## 2022-06-26 MED ORDER — ASPIRIN 81 MG PO CHEW: 81.0000 mg | CHEWABLE_TABLET | ORAL | Status: DC

## 2022-06-26 MED ORDER — VERAPAMIL HCL 2.5 MG/ML IV SOLN
INTRAVENOUS | Status: AC
  Filled 2022-06-26: qty 2

## 2022-06-26 MED ORDER — ONDANSETRON HCL 4 MG/2ML IJ SOLN
INTRAMUSCULAR | Status: AC
  Filled 2022-06-26: qty 2

## 2022-06-26 MED ORDER — ONDANSETRON HCL 4 MG/2ML IJ SOLN
INTRAMUSCULAR | Status: DC | PRN
  Administered 2022-06-26: 4 mg via INTRAVENOUS

## 2022-06-26 MED ORDER — SODIUM CHLORIDE 0.9% FLUSH: 3.0000 mL | Freq: Two times a day (BID) | INTRAVENOUS | Status: DC

## 2022-06-26 MED ORDER — TICAGRELOR 90 MG PO TABS
ORAL_TABLET | ORAL | Status: AC
  Filled 2022-06-26: qty 1

## 2022-06-26 MED ORDER — LABETALOL HCL 5 MG/ML IV SOLN: 10.0000 mg | INTRAVENOUS | Status: DC | PRN

## 2022-06-26 MED ORDER — HEPARIN SODIUM (PORCINE) 1000 UNIT/ML IJ SOLN
INTRAMUSCULAR | Status: AC
  Filled 2022-06-26: qty 10

## 2022-06-26 MED ORDER — IOHEXOL 300 MG/ML  SOLN
INTRAMUSCULAR | Status: DC | PRN
  Administered 2022-06-26: 244 mL

## 2022-06-26 MED ORDER — SODIUM CHLORIDE 0.9% FLUSH
3.0000 mL | Freq: Two times a day (BID) | INTRAVENOUS | Status: DC
  Administered 2022-06-27: 3 mL via INTRAVENOUS

## 2022-06-26 MED ORDER — ONDANSETRON HCL 4 MG/2ML IJ SOLN: 4.0000 mg | Freq: Four times a day (QID) | INTRAMUSCULAR | Status: DC | PRN

## 2022-06-26 MED ORDER — FENTANYL CITRATE (PF) 100 MCG/2ML IJ SOLN
INTRAMUSCULAR | Status: AC
  Filled 2022-06-26: qty 2

## 2022-06-26 MED ORDER — TICAGRELOR 90 MG PO TABS
90.0000 mg | ORAL_TABLET | Freq: Two times a day (BID) | ORAL | Status: DC
  Administered 2022-06-26 – 2022-06-27 (×2): 90 mg via ORAL
  Filled 2022-06-26 (×2): qty 1

## 2022-06-26 MED ORDER — TRAMADOL HCL 50 MG PO TABS
50.0000 mg | ORAL_TABLET | Freq: Once | ORAL | Status: AC
  Administered 2022-06-26: 50 mg via ORAL
  Filled 2022-06-26: qty 1

## 2022-06-26 MED ORDER — INSULIN ASPART 100 UNIT/ML IJ SOLN
INTRAMUSCULAR | Status: AC
  Administered 2022-06-26: 4 [IU] via SUBCUTANEOUS
  Filled 2022-06-26: qty 1

## 2022-06-26 MED ORDER — SODIUM CHLORIDE 0.9 % IV SOLN: 250.0000 mL | INTRAVENOUS | Status: DC | PRN

## 2022-06-26 MED ORDER — ASPIRIN 81 MG PO CHEW
81.0000 mg | CHEWABLE_TABLET | Freq: Every day | ORAL | Status: DC
  Administered 2022-06-27: 81 mg via ORAL
  Filled 2022-06-26: qty 1

## 2022-06-26 MED ORDER — TICAGRELOR 90 MG PO TABS
ORAL_TABLET | ORAL | Status: DC | PRN
  Administered 2022-06-26: 90 mg via ORAL

## 2022-06-26 MED ORDER — ACETAMINOPHEN 325 MG PO TABS: 650.0000 mg | ORAL_TABLET | ORAL | Status: DC | PRN

## 2022-06-26 MED ORDER — SODIUM CHLORIDE 0.9 % WEIGHT BASED INFUSION
3.0000 mL/kg/h | INTRAVENOUS | Status: DC
  Administered 2022-06-26: 3 mL/kg/h via INTRAVENOUS

## 2022-06-26 MED ORDER — LIDOCAINE HCL 1 % IJ SOLN
INTRAMUSCULAR | Status: AC
  Filled 2022-06-26: qty 20

## 2022-06-26 MED ORDER — TRAMADOL HCL 50 MG PO TABS
ORAL_TABLET | ORAL | Status: AC
  Filled 2022-06-26: qty 1

## 2022-06-26 MED ORDER — TRAMADOL HCL 50 MG PO TABS
50.0000 mg | ORAL_TABLET | Freq: Four times a day (QID) | ORAL | Status: DC | PRN
  Administered 2022-06-26 – 2022-06-27 (×3): 50 mg via ORAL
  Filled 2022-06-26 (×2): qty 1

## 2022-06-26 MED ORDER — FENTANYL CITRATE (PF) 100 MCG/2ML IJ SOLN
INTRAMUSCULAR | Status: DC | PRN
  Administered 2022-06-26: 25 ug via INTRAVENOUS
  Administered 2022-06-26: 50 ug via INTRAVENOUS
  Administered 2022-06-26: 25 ug via INTRAVENOUS
  Administered 2022-06-26: 50 ug via INTRAVENOUS

## 2022-06-26 MED ORDER — SODIUM CHLORIDE 0.9 % IV SOLN
INTRAVENOUS | Status: DC | PRN
  Administered 2022-06-26 (×2): 1.75 mg/kg/h via INTRAVENOUS

## 2022-06-26 MED ORDER — LIDOCAINE HCL (PF) 1 % IJ SOLN
INTRAMUSCULAR | Status: DC | PRN
  Administered 2022-06-26: 5 mL

## 2022-06-26 MED ORDER — BIVALIRUDIN BOLUS VIA INFUSION - CUPID
INTRAVENOUS | Status: DC | PRN
  Administered 2022-06-26: 105 mg via INTRAVENOUS

## 2022-06-26 MED ORDER — HYDRALAZINE HCL 20 MG/ML IJ SOLN: 10.0000 mg | INTRAMUSCULAR | Status: DC | PRN

## 2022-06-26 SURGICAL SUPPLY — 19 items
BALLN TREK RX 2.5X15 (BALLOONS) ×2
BALLOON TREK RX 2.5X15 (BALLOONS) IMPLANT
CATH VISTA GUIDE 6FR JR4 (CATHETERS) IMPLANT
DEVICE RAD COMP TR BAND LRG (VASCULAR PRODUCTS) IMPLANT
DEVICE RAD TR BAND REGULAR (VASCULAR PRODUCTS) IMPLANT
DRAPE BRACHIAL (DRAPES) IMPLANT
GLIDESHEATH SLEND SS 6F .021 (SHEATH) IMPLANT
GUIDEWIRE INQWIRE 1.5J.035X260 (WIRE) IMPLANT
INQWIRE 1.5J .035X260CM (WIRE) ×1
KIT ENCORE 26 ADVANTAGE (KITS) IMPLANT
PACK CARDIAC CATH (CUSTOM PROCEDURE TRAY) ×1 IMPLANT
PROTECTION STATION PRESSURIZED (MISCELLANEOUS) ×1
SET ATX SIMPLICITY (MISCELLANEOUS) IMPLANT
STATION PROTECTION PRESSURIZED (MISCELLANEOUS) IMPLANT
STENT ONYX FRONTIER 2.5X12 (Permanent Stent) IMPLANT
STENT ONYX FRONTIER 2.5X15 (Permanent Stent) IMPLANT
TUBING CIL FLEX 10 FLL-RA (TUBING) IMPLANT
WIRE ASAHI GRAND SLAM 180CM (WIRE) IMPLANT
WIRE G HI TQ BMW 190 (WIRE) IMPLANT

## 2022-06-26 NOTE — Progress Notes (Signed)
PROGRESS NOTE    Connor Ross  HBZ:[id number] DOB: 1965-12-08 DOA: 06/24/2022 PCP: [name], MD    Brief Narrative:  56 year old man with diabetes, hypertension, hyperlipidemia, morbid obesity presented to the emergency room with 3 episodes of chest pain. Troponin peaked at 292. Patient was started on heparin drip. He was kept on his Crestor but increased dose and continued on his aspirin. Metoprolol was started. He was brought for the cardiac Cath Lab on 06/25/2022.  PCI with DES deployed.  Patient returning to Cath Lab on 12/13 for two-stage PCI.   Assessment & Plan:   Principal Problem:   NSTEMI (non-ST elevated myocardial infarction) [facility]) Active Problems:   Morbid obesity with BMI of 45.0-49.9, adult ([place])   Spinal stenosis of lumbar region with radiculopathy   OSA on CPAP   Hypertension   Type 2 diabetes mellitus with hyperlipidemia ([place])   NSTEMI (non-ST elevated myocardial infarction) ([place]) With cardiac cath on 06/25/2022.  PCI with DES deployed.  Plan for two-stage PCI.  Returning to Cath Lab 12/13.  Patient currently on aspirin, Brilinta, Crestor, metoprolol, losartan.  Will continue goal-directed medical therapy.  Echocardiogram EF 60-65%, moderate LVH.  Cardiology follow-up.    Type 2 diabetes mellitus with hyperlipidemia (HCC) Hemoglobin A1c elevated 8.0.  Patient on 15 units of Levemir insulin plus sliding scale.  LDL 88 on Crestor.  Triglycerides also elevated in the 300s.  Basal bolus regimen.  Carb modified diet.   Hypertension Continue metoprolol and losartan   OSA on CPAP Continue CPAP   Spinal stenosis of lumbar region with radiculopathy Chronic back pain.  Home tramadol restarted   Morbid obesity with BMI of 45.0-49.9, adult (HCC) BMI 43.05.  Case overall care and prognosis   DVT prophylaxis: SQ Lovenox Code Status: Full Family Communication: None today Disposition Plan: Status is: Inpatient Remains inpatient appropriate because:  NSTEMI.  Two-stage PCI.  Second stage today.  Possible discharge in 24 to 48 hours.   Level of care: Progressive  Consultants:  Cardiology-[facility]  Procedures:  Left heart catheterization  Antimicrobials: None   Subjective: Seen and examined.  Sitting up in chair.  No chest pain endorsed  Objective: Vitals:   06/25/22 2357 06/26/22 0340 06/26/22 0820 06/26/22 1145  BP: 128/63 137/81 137/68 124/75  Pulse: 75 78 71 72  Resp: '18 17 18 16  '$ Temp: 97.6 F (36.4 C) 98.6 F (37 C) (!) 97.5 F (36.4 C) 98 F (36.7 C)  TempSrc: Axillary Oral    SpO2: 97% 97% 99% 97%  Weight:      Height:        Intake/Output Summary (Last 24 hours) at 06/26/2022 1338 Last data filed at 06/26/2022 0847 Gross per 24 hour  Intake 1628.56 ml  Output --  Net 1628.56 ml   Filed Weights   06/23/22 2215 06/25/22 0047  Weight: (!) 140.6 kg (!) 140 kg    Examination:  General exam: Appears calm and comfortable  Respiratory system: Clear to auscultation. Respiratory effort normal. Cardiovascular system: B0-F7, RRR, 2/6 systolic murmur, no pedal edema Gastrointestinal system: Obese, soft, NT/ND, normal bowel sounds Central nervous system: Alert and oriented. No focal neurological deficits. Extremities: Symmetric 5 x 5 power. Skin: No rashes, lesions or ulcers Psychiatry: Judgement and insight appear normal. Mood & affect appropriate.     Data Reviewed: I have personally reviewed following labs and imaging studies  CBC: Recent Labs  Lab 06/23/22 2217 06/25/22 0604 06/25/22 2030 06/26/22 0329  WBC 10.8* 12.7*  --  11.9*  HGB 15.1 14.3  --  13.9  HCT 45.5 43.4  --  41.9  MCV 91.0 91.9  --  90.7  PLT 272 259 270 841   Basic Metabolic Panel: Recent Labs  Lab 06/23/22 2217 06/25/22 0604 06/26/22 0329  NA 138 137 139  K 3.5 3.5 3.7  CL 104 105 110  CO2 '23 25 23  '$ GLUCOSE 231* 189* 172*  BUN '14 16 13  '$ CREATININE 0.81 0.64 0.51*  CALCIUM 9.1 8.7* 8.6*    GFR: Estimated Creatinine Clearance: 144.1 mL/min (A) (by C-G formula based on SCr of 0.51 mg/dL (L)). Liver Function Tests: No results for input(s): "AST", "ALT", "ALKPHOS", "BILITOT", "PROT", "ALBUMIN" in the last 168 hours. No results for input(s): "LIPASE", "AMYLASE" in the last 168 hours. No results for input(s): "AMMONIA" in the last 168 hours. Coagulation Profile: Recent Labs  Lab 06/24/22 0625  INR 1.0   Cardiac Enzymes: No results for input(s): "CKTOTAL", "CKMB", "CKMBINDEX", "TROPONINI" in the last 168 hours. BNP (last 3 results) No results for input(s): "PROBNP" in the last 8760 hours. HbA1C: No results for input(s): "HGBA1C" in the last 72 hours. CBG: Recent Labs  Lab 06/25/22 0024 06/25/22 1005 06/25/22 2157 06/26/22 0913 06/26/22 1144  GLUCAP 150* 185* 188* 174* 206*   Lipid Profile: Recent Labs    06/25/22 0604  CHOL 182  HDL 26*  LDLCALC 88  TRIG 340*  CHOLHDL 7.0   Thyroid Function Tests: No results for input(s): "TSH", "T4TOTAL", "FREET4", "T3FREE", "THYROIDAB" in the last 72 hours. Anemia Panel: No results for input(s): "VITAMINB12", "FOLATE", "FERRITIN", "TIBC", "IRON", "RETICCTPCT" in the last 72 hours. Sepsis Labs: No results for input(s): "PROCALCITON", "LATICACIDVEN" in the last 168 hours.  Recent Results (from the past 240 hour(s))  Resp Panel by RT-PCR (Flu A&B, Covid) Anterior Nasal Swab     Status: None   Collection Time: 06/24/22  6:24 AM   Specimen: Anterior Nasal Swab  Result Value Ref Range Status   SARS Coronavirus 2 by RT PCR NEGATIVE NEGATIVE Final    Comment: (NOTE) SARS-CoV-2 target nucleic acids are NOT DETECTED.  The SARS-CoV-2 RNA is generally detectable in upper respiratory specimens during the acute phase of infection. The lowest concentration of SARS-CoV-2 viral copies this assay can detect is 138 copies/mL. A negative result does not preclude SARS-Cov-2 infection and should not be used as the sole basis for  treatment or other patient management decisions. A negative result may occur with  improper specimen collection/handling, submission of specimen other than nasopharyngeal swab, presence of viral mutation(s) within the areas targeted by this assay, and inadequate number of viral copies(<138 copies/mL). A negative result must be combined with clinical observations, patient history, and epidemiological information. The expected result is Negative.  Fact Sheet for Patients:  EntrepreneurPulse.com.au  Fact Sheet for Healthcare Providers:  IncredibleEmployment.be  This test is no t yet approved or cleared by the [place] FDA and  has been authorized for detection and/or diagnosis of SARS-CoV-2 by FDA under an Emergency Use Authorization (EUA). This EUA will remain  in effect (meaning this test can be used) for the duration of the COVID-19 declaration under Section 564(b)(1) of the Act, 21 U.S.C.section 360bbb-3(b)(1), unless the authorization is terminated  or revoked sooner.       Influenza A by PCR NEGATIVE NEGATIVE Final   Influenza B by PCR NEGATIVE NEGATIVE Final    Comment: (NOTE) The Xpert Xpress SARS-CoV-2/FLU/RSV plus assay is intended as an aid in the  diagnosis of influenza from Nasopharyngeal swab specimens and should not be used as a sole basis for treatment. Nasal washings and aspirates are unacceptable for Xpert Xpress SARS-CoV-2/FLU/RSV testing.  Fact Sheet for Patients: EntrepreneurPulse.com.au  Fact Sheet for Healthcare Providers: IncredibleEmployment.be  This test is not yet approved or cleared by the [place] FDA and has been authorized for detection and/or diagnosis of SARS-CoV-2 by FDA under an Emergency Use Authorization (EUA). This EUA will remain in effect (meaning this test can be used) for the duration of the COVID-19 declaration under Section 564(b)(1) of the Act, 21  U.S.C. section 360bbb-3(b)(1), unless the authorization is terminated or revoked.  Performed at [facility], [address] [place], [place] [address]          Radiology Studies: CARDIAC CATHETERIZATION  Result Date: 06/26/2022   Prox LAD lesion is 80% stenosed.   Mid LAD lesion is 70% stenosed.   Dist RCA lesion is 90% stenosed.   Prox Cx to Mid Cx lesion is 50% stenosed.   A stent was successfully placed.   A stent was successfully placed.   Post intervention, there is a 0% residual stenosis.   Post intervention, there is a 0% residual stenosis.   The left ventricular systolic function is normal.   LV end diastolic pressure is normal.   The left ventricular ejection fraction is 55-65% by visual estimate.   There is no mitral valve regurgitation. Conclusion Successful left heart cath right radial approach because of unstable angina Normal left ventricular function EF 55% mild left ventricular enlargement Coronaries Left main minor irregularities LAD large 80% proximal 70% mid Circumflex large moderate disease 50% mid circumflex diffuse RCA large 90% distal RCA prior to bifurcation of PL and PDA All vessels with TIMI-3 flow Right dominant system Intervention Successful PCI and stent proximal LAD and mid LAD with overlapping stents mid LAD 2.75 x 38 proximal LAD 3.0 x 18 Onyx frontiers Lesions reduced from 80 to 0% and 70- 0% Recommend staged procedure for distal RCA to be done prior to discharge Patient was treated with Aggrastat for 12 hours Maintained on aspirin and Brilinta Patient tolerated procedure well No complications   ECHOCARDIOGRAM COMPLETE  Result Date: 06/24/2022    ECHOCARDIOGRAM REPORT   Patient Name:   Connor Ross [facility] Date of Exam: 06/24/2022 Medical Rec #:  [id number]             Height:       71.0 in Accession #:    [id number]            Weight:       310.0 lb Date of Birth:  06-Sep-1965             BSA:          2.540 m Patient Age:    21 years               BP:           128/83 mmHg Patient Gender: M                     HR:           81 bpm. Exam Location:  [facility] Procedure: 2D Echo, Color Doppler, Cardiac Doppler and Intracardiac            Opacification Agent Indications:     I21.4 NSTEMI  History:         Patient has no prior history of Echocardiogram  examinations.                  Risk Factors:Hypertension, Diabetes and Dyslipidemia.  Sonographer:     Charmayne Sheer Referring Phys:  [id number] Gwinn TANG Diagnosing Phys: Yolonda Kida MD  Sonographer Comments: Technically difficult study due to poor echo windows. Image acquisition challenging due to patient body habitus. IMPRESSIONS  1. Left ventricular ejection fraction, by estimation, is 60 to 65%. The left ventricle has normal function. The left ventricle has no regional wall motion abnormalities. The left ventricular internal cavity size was moderately dilated. There is moderate  left ventricular hypertrophy. Left ventricular diastolic parameters are consistent with Grade I diastolic dysfunction (impaired relaxation).  2. Right ventricular systolic function is normal. The right ventricular size is normal.  3. Left atrial size was mildly dilated.  4. Right atrial size was mildly dilated.  5. The mitral valve is normal in structure. No evidence of mitral valve regurgitation.  6. The aortic valve is grossly normal. Aortic valve regurgitation is mild to moderate. FINDINGS  Left Ventricle: Left ventricular ejection fraction, by estimation, is 60 to 65%. The left ventricle has normal function. The left ventricle has no regional wall motion abnormalities. Definity contrast agent was given IV to delineate the left ventricular  endocardial borders. The left ventricular internal cavity size was moderately dilated. There is moderate left ventricular hypertrophy. Left ventricular diastolic parameters are consistent with Grade I diastolic dysfunction (impaired relaxation). Right Ventricle: The right ventricular size is  normal. No increase in right ventricular wall thickness. Right ventricular systolic function is normal. Left Atrium: Left atrial size was mildly dilated. Right Atrium: Right atrial size was mildly dilated. Pericardium: There is no evidence of pericardial effusion. Mitral Valve: The mitral valve is normal in structure. No evidence of mitral valve regurgitation. Tricuspid Valve: The tricuspid valve is normal in structure. Tricuspid valve regurgitation is trivial. Aortic Valve: The aortic valve is grossly normal. Aortic valve regurgitation is mild to moderate. Aortic valve mean gradient measures 5.0 mmHg. Aortic valve peak gradient measures 9.5 mmHg. Aortic valve area, by VTI measures 3.38 cm. Pulmonic Valve: The pulmonic valve was normal in structure. Pulmonic valve regurgitation is not visualized. Aorta: The ascending aorta was not well visualized. IAS/Shunts: No atrial level shunt detected by color flow Doppler.  LEFT VENTRICLE PLAX 2D LVIDd:         5.20 cm   Diastology LVIDs:         3.40 cm   LV e' medial:    6.09 cm/s LV PW:         1.90 cm   LV E/e' medial:  13.0 LV IVS:        1.20 cm   LV e' lateral:   6.42 cm/s LVOT diam:     2.40 cm   LV E/e' lateral: 12.3 LV SV:         86 LV SV Index:   34 LVOT Area:     4.52 cm  LEFT ATRIUM           Index LA diam:      4.00 cm 1.57 cm/m LA Vol (A4C): 98.5 ml 38.78 ml/m  AORTIC VALVE                     PULMONIC VALVE AV Area (Vmax):    3.03 cm      PV Vmax:       0.99 m/s AV Area (Vmean):   2.99  cm      PV Vmean:      70.400 cm/s AV Area (VTI):     3.38 cm      PV VTI:        0.167 m AV Vmax:           154.00 cm/s   PV Peak grad:  3.9 mmHg AV Vmean:          110.000 cm/s  PV Mean grad:  2.0 mmHg AV VTI:            0.256 m AV Peak Grad:      9.5 mmHg AV Mean Grad:      5.0 mmHg LVOT Vmax:         103.00 cm/s LVOT Vmean:        72.600 cm/s LVOT VTI:          0.191 m LVOT/AV VTI ratio: 0.75  AORTA Ao Root diam: 4.00 cm MITRAL VALVE MV Area (PHT): 3.58 cm     SHUNTS  MV Decel Time: 212 msec     Systemic VTI:  0.19 m MV E velocity: 79.10 cm/s   Systemic Diam: 2.40 cm MV A velocity: 129.00 cm/s MV E/A ratio:  0.61 Dwayne D Callwood MD Electronically signed by Yolonda Kida MD Signature Date/Time: 06/24/2022/4:06:35 PM    Final         Scheduled Meds:  aspirin  81 mg Oral Daily   [START ON 06/27/2022] aspirin  81 mg Oral Pre-Cath   furosemide  20 mg Oral QODAY   gabapentin  200 mg Oral TID   insulin aspart  0-20 Units Subcutaneous TID WC   insulin detemir  50 Units Subcutaneous Daily   isosorbide mononitrate  60 mg Oral Daily   losartan  100 mg Oral Daily   metoprolol tartrate  25 mg Oral BID   rosuvastatin  40 mg Oral QPM   sodium chloride flush  3 mL Intravenous Q12H   sodium chloride flush  3 mL Intravenous Q12H   tamsulosin  0.4 mg Oral QPM   ticagrelor  90 mg Oral BID   Continuous Infusions:  sodium chloride     sodium chloride     [START ON 06/27/2022] sodium chloride     Followed by   Derrill Memo ON 06/27/2022] sodium chloride       LOS: 2 days      Sidney Ace, MD [facility]   If 7PM-7AM, please contact night-coverage  06/26/2022, 1:38 PM

## 2022-06-26 NOTE — TOC Benefit Eligibility Note (Signed)
Patient Teacher, [name] as a foreign language completed.    The patient is currently admitted and upon discharge could be taking Brilinta 90 mg.  The current 30 day co-pay is $45.00.   The patient is insured through [place], [place] Patient Advocate Specialist [place] Patient Advocate Team Direct Number: [phone]  Fax: [phone]

## 2022-06-26 NOTE — Progress Notes (Signed)
Mobility Specialist - Progress Note   06/26/22 1250  Mobility  Activity Ambulated independently in hallway  Level of Assistance Independent  Assistive Device None  Distance Ambulated (ft) 160 ft  Activity Response Tolerated well  Mobility Referral Yes  $Mobility charge 1 Mobility   Pt sitting in recliner on RA upon arrival. Pt STS and ambulates 1 lap around NS indep with no LOB. Pt returns to recliner with needs in reach and family present.   Gretchen Short  Mobility Specialist  06/26/22 12:51 PM

## 2022-06-26 NOTE — Progress Notes (Signed)
  Transition of Care [facility]) Screening Note   Patient Details  Name: Connor Ross Date of Birth: 24-Dec-1965   Transition of Care [facility]) CM/SW Contact:    Tiburcio Bash, LCSW Phone Number: 06/26/2022, 10:11 AM    Transition of Care Department [facility]) has reviewed patient and no TOC needs have been identified at this time. We will continue to monitor patient advancement through interdisciplinary progression rounds. If new patient transition needs arise, please place a TOC consult.  Kelby Fam, [place], MSW, [place]

## 2022-06-26 NOTE — Progress Notes (Signed)
[facility]    SUBJECTIVE: Patient states to be doing reasonably well ready for second intervention which has to be staged from yesterday patient had LAD done yesterday now RCA his right wrist radial area appears to be intact and no complications patient is ready for this procedure denies any significant recurrent chest pain or shortness of breath   Vitals:   06/25/22 1812 06/25/22 1949 06/25/22 2357 06/26/22 0340  BP: 137/80 (!) 118/58 128/63 137/81  Pulse: 89 83 75 78  Resp: '18 16 18 17  '$ Temp: 98.1 F (36.7 C) 97.8 F (36.6 C) 97.6 F (36.4 C) 98.6 F (37 C)  TempSrc:  Oral Axillary Oral  SpO2: 98% 97% 97% 97%  Weight:      Height:         Intake/Output Summary (Last 24 hours) at 06/26/2022 4696 Last data filed at 06/26/2022 0300 Gross per 24 hour  Intake 1567.86 ml  Output --  Net 1567.86 ml      PHYSICAL EXAM  General: Well developed, well nourished, in no acute distress HEENT:  Normocephalic and atramatic Neck:  No JVD.  Lungs: Clear bilaterally to auscultation and percussion. Heart: HRRR . Normal S1 and S2 without gallops or murmurs.  Abdomen: Bowel sounds are positive, abdomen soft and non-tender  Msk:  Back normal, normal gait. Normal strength and tone for age. Extremities: No clubbing, cyanosis or edema.   Neuro: Alert and oriented X 3. Psych:  Good affect, responds appropriately   LABS: Basic Metabolic Panel: Recent Labs    06/25/22 0604 06/26/22 0329  NA 137 139  K 3.5 3.7  CL 105 110  CO2 25 23  GLUCOSE 189* 172*  BUN 16 13  CREATININE 0.64 0.51*  CALCIUM 8.7* 8.6*   Liver Function Tests: No results for input(s): "AST", "ALT", "ALKPHOS", "BILITOT", "PROT", "ALBUMIN" in the last 72 hours. No results for input(s): "LIPASE", "AMYLASE" in the last 72 hours. CBC: Recent Labs    06/25/22 0604 06/25/22 2030 06/26/22 0329  WBC 12.7*  --  11.9*  HGB 14.3  --  13.9  HCT 43.4  --  41.9  MCV 91.9  --  90.7  PLT 259 270 296   Cardiac  Enzymes: No results for input(s): "CKTOTAL", "CKMB", "CKMBINDEX", "TROPONINI" in the last 72 hours. BNP: Invalid input(s): "POCBNP" D-Dimer: No results for input(s): "DDIMER" in the last 72 hours. Hemoglobin A1C: No results for input(s): "HGBA1C" in the last 72 hours. Fasting Lipid Panel: Recent Labs    06/25/22 0604  CHOL 182  HDL 26*  LDLCALC 88  TRIG 340*  CHOLHDL 7.0   Thyroid Function Tests: No results for input(s): "TSH", "T4TOTAL", "T3FREE", "THYROIDAB" in the last 72 hours.  Invalid input(s): "FREET3" Anemia Panel: No results for input(s): "VITAMINB12", "FOLATE", "FERRITIN", "TIBC", "IRON", "RETICCTPCT" in the last 72 hours.  CARDIAC CATHETERIZATION  Result Date: 06/26/2022   Prox LAD lesion is 80% stenosed.   Mid LAD lesion is 70% stenosed.   Dist RCA lesion is 90% stenosed.   Prox Cx to Mid Cx lesion is 50% stenosed.   A stent was successfully placed.   A stent was successfully placed.   Post intervention, there is a 0% residual stenosis.   Post intervention, there is a 0% residual stenosis.   The left ventricular systolic function is normal.   LV end diastolic pressure is normal.   The left ventricular ejection fraction is 55-65% by visual estimate.   There is no mitral valve regurgitation. Conclusion  Successful left heart cath right radial approach because of unstable angina Normal left ventricular function EF 55% mild left ventricular enlargement Coronaries Left main minor irregularities LAD large 80% proximal 70% mid Circumflex large moderate disease 50% mid circumflex diffuse RCA large 90% distal RCA prior to bifurcation of PL and PDA All vessels with TIMI-3 flow Right dominant system Intervention Successful PCI and stent proximal LAD and mid LAD with overlapping stents mid LAD 2.75 x 38 proximal LAD 3.0 x 18 Onyx frontiers Lesions reduced from 80 to 0% and 70- 0% Recommend staged procedure for distal RCA to be done prior to discharge Patient was treated with Aggrastat for  12 hours Maintained on aspirin and Brilinta Patient tolerated procedure well No complications   ECHOCARDIOGRAM COMPLETE  Result Date: 06/24/2022    ECHOCARDIOGRAM REPORT   Patient Name:   Connor Ross [facility] Date of Exam: 06/24/2022 Medical Rec #:  [id number]             Height:       71.0 in Accession #:    [id number]            Weight:       310.0 lb Date of Birth:  September 25, 1965             BSA:          2.540 m Patient Age:    56 years              BP:           128/83 mmHg Patient Gender: M                     HR:           81 bpm. Exam Location:  [facility] Procedure: 2D Echo, Color Doppler, Cardiac Doppler and Intracardiac            Opacification Agent Indications:     I21.4 NSTEMI  History:         Patient has no prior history of Echocardiogram examinations.                  Risk Factors:Hypertension, Diabetes and Dyslipidemia.  Sonographer:     Charmayne Sheer Referring Phys:  [id number] Milton TANG Diagnosing Phys: Yolonda Kida MD  Sonographer Comments: Technically difficult study due to poor echo windows. Image acquisition challenging due to patient body habitus. IMPRESSIONS  1. Left ventricular ejection fraction, by estimation, is 60 to 65%. The left ventricle has normal function. The left ventricle has no regional wall motion abnormalities. The left ventricular internal cavity size was moderately dilated. There is moderate  left ventricular hypertrophy. Left ventricular diastolic parameters are consistent with Grade I diastolic dysfunction (impaired relaxation).  2. Right ventricular systolic function is normal. The right ventricular size is normal.  3. Left atrial size was mildly dilated.  4. Right atrial size was mildly dilated.  5. The mitral valve is normal in structure. No evidence of mitral valve regurgitation.  6. The aortic valve is grossly normal. Aortic valve regurgitation is mild to moderate. FINDINGS  Left Ventricle: Left ventricular ejection fraction, by estimation, is 60 to 65%. The left  ventricle has normal function. The left ventricle has no regional wall motion abnormalities. Definity contrast agent was given IV to delineate the left ventricular  endocardial borders. The left ventricular internal cavity size was moderately dilated. There is moderate left ventricular hypertrophy. Left ventricular diastolic parameters are consistent with Grade I  diastolic dysfunction (impaired relaxation). Right Ventricle: The right ventricular size is normal. No increase in right ventricular wall thickness. Right ventricular systolic function is normal. Left Atrium: Left atrial size was mildly dilated. Right Atrium: Right atrial size was mildly dilated. Pericardium: There is no evidence of pericardial effusion. Mitral Valve: The mitral valve is normal in structure. No evidence of mitral valve regurgitation. Tricuspid Valve: The tricuspid valve is normal in structure. Tricuspid valve regurgitation is trivial. Aortic Valve: The aortic valve is grossly normal. Aortic valve regurgitation is mild to moderate. Aortic valve mean gradient measures 5.0 mmHg. Aortic valve peak gradient measures 9.5 mmHg. Aortic valve area, by VTI measures 3.38 cm. Pulmonic Valve: The pulmonic valve was normal in structure. Pulmonic valve regurgitation is not visualized. Aorta: The ascending aorta was not well visualized. IAS/Shunts: No atrial level shunt detected by color flow Doppler.  LEFT VENTRICLE PLAX 2D LVIDd:         5.20 cm   Diastology LVIDs:         3.40 cm   LV e' medial:    6.09 cm/s LV PW:         1.90 cm   LV E/e' medial:  13.0 LV IVS:        1.20 cm   LV e' lateral:   6.42 cm/s LVOT diam:     2.40 cm   LV E/e' lateral: 12.3 LV SV:         86 LV SV Index:   34 LVOT Area:     4.52 cm  LEFT ATRIUM           Index LA diam:      4.00 cm 1.57 cm/m LA Vol (A4C): 98.5 ml 38.78 ml/m  AORTIC VALVE                     PULMONIC VALVE AV Area (Vmax):    3.03 cm      PV Vmax:       0.99 m/s AV Area (Vmean):   2.99 cm      PV Vmean:       70.400 cm/s AV Area (VTI):     3.38 cm      PV VTI:        0.167 m AV Vmax:           154.00 cm/s   PV Peak grad:  3.9 mmHg AV Vmean:          110.000 cm/s  PV Mean grad:  2.0 mmHg AV VTI:            0.256 m AV Peak Grad:      9.5 mmHg AV Mean Grad:      5.0 mmHg LVOT Vmax:         103.00 cm/s LVOT Vmean:        72.600 cm/s LVOT VTI:          0.191 m LVOT/AV VTI ratio: 0.75  AORTA Ao Root diam: 4.00 cm MITRAL VALVE MV Area (PHT): 3.58 cm     SHUNTS MV Decel Time: 212 msec     Systemic VTI:  0.19 m MV E velocity: 79.10 cm/s   Systemic Diam: 2.40 cm MV A velocity: 129.00 cm/s MV E/A ratio:  0.61 Blanchie Zeleznik D Nirav Sweda MD Electronically signed by Yolonda Kida MD Signature Date/Time: 06/24/2022/4:06:35 PM    Final      Echo for ventricular function EF 60%  TELEMETRY: Normal sinus rhythm rate  around 75 nonspecific CT changes:  ASSESSMENT AND PLAN:  Principal Problem:   NSTEMI (non-ST elevated myocardial infarction) [facility]) Active Problems:   Morbid obesity with BMI of 45.0-49.9, adult ([place])   Spinal stenosis of lumbar region with radiculopathy   OSA on CPAP   Hypertension   Type 2 diabetes mellitus with hyperlipidemia ([place])    Plan Possible non-STEMI with borderline troponins were 300 patient had unstable anginal symptoms underwent PCI and stent after cardiac cath showed multivessel disease LAD RCA Status post PCI and stent to proximal and mid LAD DES on Brilinta and aspirin patient to have a stage procedure today for the RCA Diabetes type 2 continue diabetes management elevated A1c recommend continue Levemir insulin diabetic diet exercise weight loss  Obesity recommend weight loss exercise portion control Hypertension reasonably manage maintain ARB and beta-blocker Hyperlipidemia would recommend statin therapy Crestor moderate to high-dose Probable obstructive sleep apnea recommend sleep study CPAP weight loss Preop for stage RCA intervention today maintain aspirin Plavix beta-blocker ARB  statin Hopefully after the procedure is successful would hopefully anticipate discharge home within 24 to 48 hours   Yolonda Kida, MD, 06/26/2022 7:13 AM

## 2022-06-26 NOTE — Progress Notes (Signed)
       CROSS COVER NOTE  NAME: SIM CHOQUETTE MRN: [id number] DOB : Oct 12, 1965 ATTENDING PHYSICIAN: Loletha Grayer, MD    Date of Service   06/26/2022   HPI/Events of Note   Medication request received for home Tramadol for patient report of 6/10 chronic back pain exacerbated by hospital bed.  Interventions   Assessment/Plan:  Tramadol 50 mg x1      This document was prepared using Dragon voice recognition software and may include unintentional dictation errors.  Neomia Glass DNP, MBA, FNP-BC Nurse Practitioner [facility] Pager [phone]

## 2022-06-26 NOTE — CV Procedure (Signed)
Brief PCI note  Right radial approach Successful PCI and stent to distal RCA with 2 overlapping stents 90% distal RCA 2.5x 15 frontier Onyx/2.5 x 12 proximal portion overlapping Fonte Onyx  Lesion reduced from 90 down to 0% TIMI-3 flow maintained throughout Maintain Brilinta and aspirin Full cath note to follow

## 2022-06-27 LAB — GLUCOSE, CAPILLARY: Glucose-Capillary: 200 mg/dL — ABNORMAL HIGH (ref 70–99)

## 2022-06-27 LAB — BASIC METABOLIC PANEL
Anion gap: 7 (ref 5–15)
BUN: 13 mg/dL (ref 6–20)
CO2: 24 mmol/L (ref 22–32)
Calcium: 8.4 mg/dL — ABNORMAL LOW (ref 8.9–10.3)
Chloride: 106 mmol/L (ref 98–111)
Creatinine, Ser: 0.63 mg/dL (ref 0.61–1.24)
GFR, Estimated: >60 mL/min (ref >60)
Glucose, Bld: 173 mg/dL — ABNORMAL HIGH (ref 70–99)
Potassium: 3.4 mmol/L — ABNORMAL LOW (ref 3.5–5.1)
Sodium: 137 mmol/L (ref 135–145)

## 2022-06-27 LAB — CBC
HCT: 40.4 % (ref 39.0–52.0)
Hemoglobin: 13.2 g/dL (ref 13.0–17.0)
MCH: 30.3 pg (ref 26.0–34.0)
MCHC: 32.7 g/dL (ref 30.0–36.0)
MCV: 92.9 fL (ref 80.0–100.0)
Platelets: 256 10*3/uL (ref 150–400)
RBC: 4.35 MIL/uL (ref 4.22–5.81)
RDW: 13.2 % (ref 11.5–15.5)
WBC: 11.8 10*3/uL — ABNORMAL HIGH (ref 4.0–10.5)
nRBC: 0 % (ref 0.0–0.2)

## 2022-06-27 MED ORDER — POTASSIUM CHLORIDE CRYS ER 20 MEQ PO TBCR
40.0000 meq | EXTENDED_RELEASE_TABLET | Freq: Once | ORAL | Status: AC
  Administered 2022-06-27: 40 meq via ORAL
  Filled 2022-06-27: qty 2

## 2022-06-27 MED ORDER — TICAGRELOR 90 MG PO TABS: 90.0000 mg | ORAL_TABLET | Freq: Two times a day (BID) | ORAL | 0 refills | Status: DC

## 2022-06-27 MED ORDER — ROSUVASTATIN CALCIUM 40 MG PO TABS: 40.0000 mg | ORAL_TABLET | Freq: Every evening | ORAL | 0 refills | Status: DC

## 2022-06-27 MED ORDER — ISOSORBIDE MONONITRATE ER 60 MG PO TB24: 60.0000 mg | ORAL_TABLET | Freq: Every day | ORAL | 0 refills | Status: DC

## 2022-06-27 MED ORDER — METOPROLOL TARTRATE 25 MG PO TABS: 25.0000 mg | ORAL_TABLET | Freq: Two times a day (BID) | ORAL | 0 refills | Status: DC

## 2022-06-27 MED ORDER — LOPERAMIDE HCL 2 MG PO TABS: 2.0000 mg | ORAL_TABLET | Freq: Four times a day (QID) | ORAL | 0 refills | Status: DC | PRN

## 2022-06-27 NOTE — Progress Notes (Signed)
[name] to be D/C'd home per MD order. Discussed with the patient and all questions fully answered.  Skin clean, dry and intact without evidence of skin break down, no evidence of skin tears noted. R wrist dressing clean and intact. IV catheters discontinued intact. Site without signs and symptoms of complications. Dressing and pressure applied.  An After Visit Summary was printed and given to the patient.  Patient escorted via [place], and D/C home via private auto.  Melonie [place]  06/27/2022 1:52 PM

## 2022-06-27 NOTE — Discharge Summary (Signed)
Physician Discharge Summary  ELLA GUILLOTTE MBT:[id number] DOB: 06-22-1966 DOA: 06/24/2022  PCP: [name], MD  Admit date: 06/24/2022 Discharge date: 06/27/2022  Admitted From: Home Disposition: Home  Recommendations for Outpatient Follow-up:  Follow up with PCP in 1-2 weeks Follow-up with cardiology 1 to 2 weeks  Home Health: No Equipment/Devices: None  Discharge Condition: Stable CODE STATUS: Full Diet recommendation: Heart healthy/carb modified  Brief/Interim Summary: 56 year old man with diabetes, hypertension, hyperlipidemia, morbid obesity presented to the emergency room with 3 episodes of chest pain. Troponin peaked at 292. Patient was started on heparin drip. He was kept on his Crestor but increased dose and continued on his aspirin. Metoprolol was started. He was brought for the cardiac Cath Lab on 06/25/2022.  PCI with DES deployed.  Patient returning to Cath Lab on 12/13 for two-stage PCI.   Patient back to the Cath Lab on 12/13.  Additional PCI with DES deployed.  3 stents in total.  Patient stable.  Chest pain-free after procedure.  Will discharge home on dual antiplatelet therapy and goal-directed medical therapy.  Follow-up outpatient PCP and cardiology.    Discharge Diagnoses:  Principal Problem:   NSTEMI (non-ST elevated myocardial infarction) [facility]) Active Problems:   Morbid obesity with BMI of 45.0-49.9, adult ([place])   Spinal stenosis of lumbar region with radiculopathy   OSA on CPAP   Hypertension   Type 2 diabetes mellitus with hyperlipidemia ([place])   NSTEMI (non-ST elevated myocardial infarction) ([place]) With cardiac cath on 06/25/2022.  PCI with DES deployed.  Plan for two-stage PCI.  Returning to Cath Lab 12/13.  Patient status post two-stage PCI.  Additional DES deployed.  Patient stable postprocedure.  Will discharge on aspirin, Brilinta, high intensity Crestor, beta-blocker, ACE inhibitor.  Discontinue PTA antihypertensives.  Add long-acting  nitrate.  Follow-up outpatient PCP and cardiology.     Type 2 diabetes mellitus with hyperlipidemia (HCC) Hemoglobin A1c elevated 8.0.  Resume home regimen at discharge but patient will need to see his primary care physician to discuss optimized diabetic regimen.  Discharge Instructions  Discharge Instructions     AMB Referral to Cardiac Rehabilitation - Phase II   Complete by: As directed    Diagnosis:  Coronary Stents NSTEMI     After initial evaluation and assessments completed: Virtual Based Care may be provided alone or in conjunction with Phase 2 Cardiac Rehab based on patient barriers.: Yes   AMB Referral to Cardiac Rehabilitation - Phase II   Complete by: As directed    Diagnosis: Coronary Stents   After initial evaluation and assessments completed: Virtual Based Care may be provided alone or in conjunction with Phase 2 Cardiac Rehab based on patient barriers.: Yes   Diet - low sodium heart healthy   Complete by: As directed    Increase activity slowly   Complete by: As directed       Allergies as of 06/27/2022       Reactions   Statins    Other Reaction(s): arthralgia (joint pain) Patient able to tolerate Crestor 10 mg   Hctz [hydrochlorothiazide] Other (See Comments)   Extreme Cramping   Metformin And Related Diarrhea        Medication List     STOP taking these medications    amLODipine 2.5 MG tablet Commonly known as: NORVASC       TAKE these medications    Aspirin Low Dose 81 MG tablet Generic drug: aspirin EC TAKE 1 TABLET BY MOUTH  DAILY (SWALLOW WHOLE)  blood glucose meter kit and supplies Dispense based on patient and insurance preference. Use up to four times daily as directed. (FOR ICD-10 E10.9, E11.9). Use to check blood sugar three times daily   clobetasol cream 0.05 % Commonly known as: TEMOVATE Apply topically 2 (two) times daily.   fluticasone 50 MCG/ACT nasal spray Commonly known as: FLONASE PLACE 2 SPRAYS INTO BOTH NOSTRILS  DAILY. AT NIGHT AFTER NASAL SALINE   freestyle lancets 1 each by Other route 2 (two) times daily. DX 250.02   FreeStyle Libre 2 Sensor Misc Use to check sugar at least 4 times daily   furosemide 20 MG tablet Commonly known as: LASIX TAKE 1 TABLET (20 MG TOTAL) BY MOUTH EVERY OTHER DAY. What changed: when to take this   gabapentin 100 MG capsule Commonly known as: NEURONTIN Take 2 capsules (200 mg total) by mouth 3 (three) times daily. What changed:  when to take this reasons to take this   Insulin Pen Needle 32G X 6 MM Misc Use daily with insulin pen   isosorbide mononitrate 60 MG 24 hr tablet Commonly known as: IMDUR Take 1 tablet (60 mg total) by mouth daily. Start taking on: June 28, 2022   Jardiance 25 MG Tabs tablet Generic drug: empagliflozin TAKE 1 TABLET EVERY DAY What changed: how much to take   ketoconazole 2 % cream Commonly known as: NIZORAL SMARTSIG:1 Application Topical 1 to 2 Times Daily   loperamide 2 MG tablet Commonly known as: Imodium A-D Take 1 tablet (2 mg total) by mouth 4 (four) times daily as needed for diarrhea or loose stools.   losartan 100 MG tablet Commonly known as: COZAAR TAKE 1 TABLET (100 MG TOTAL) DAILY. What changed: See the new instructions.   metoprolol tartrate 25 MG tablet Commonly known as: LOPRESSOR Take 1 tablet (25 mg total) by mouth 2 (two) times daily.   NovoLOG FlexPen 100 UNIT/ML FlexPen Generic drug: insulin aspart Inject up to 25 units with breakfast, 25 units with lunch, and 30 units with supper   OneTouch Verio test strip Generic drug: glucose blood USE TO CHECK BLOOD SUGAR 3  TIMES DAILY   Ozempic (2 MG/DOSE) 8 MG/3ML Sopn Generic drug: Semaglutide (2 MG/DOSE) Inject 2 mg into the skin once a week.   rosuvastatin 40 MG tablet Commonly known as: CRESTOR Take 1 tablet (40 mg total) by mouth every evening. What changed:  medication strength how much to take how to take this when to take  this additional instructions   sildenafil 50 MG tablet Commonly known as: VIAGRA Take 1 tablet (50 mg total) by mouth daily as needed for erectile dysfunction.   tamsulosin 0.4 MG Caps capsule Commonly known as: FLOMAX TAKE 1 CAPSULE EVERY DAY   ticagrelor 90 MG Tabs tablet Commonly known as: BRILINTA Take 1 tablet (90 mg total) by mouth 2 (two) times daily.   traMADol 50 MG tablet Commonly known as: ULTRAM TAKE 1 TABLET EVERY 12 HOURS AS NEEDED What changed: See the new instructions.   Tyler Aas FlexTouch 200 UNIT/ML FlexTouch Pen Generic drug: insulin degludec Inject 88 Units into the skin daily. What changed: how much to take   triamcinolone 0.025 % cream Commonly known as: KENALOG Apply topically.        Follow-up Information     Yolonda Kida, MD. Go in 1 week(s).   Specialties: Cardiology, Internal Medicine Why: Appointment on Wednesday, 07/17/2021 at 3:00pm. Contact information: [place] [address] [phone]  [name], MD. Schedule an appointment as soon as possible for a visit in 1 week(s).   Specialty: Internal Medicine Contact information: [address] [place] [address] [phone]                Allergies  Allergen Reactions   Statins     Other Reaction(s): arthralgia (joint pain) Patient able to tolerate Crestor 10 mg   Hctz [Hydrochlorothiazide] Other (See Comments)    Extreme Cramping   Metformin And Related Diarrhea    Consultations: Cardiology-[facility]   Procedures/Studies: CARDIAC CATHETERIZATION  Result Date: 06/26/2022   Prox LAD lesion is 80% stenosed.   Mid LAD lesion is 70% stenosed.   Dist RCA lesion is 90% stenosed.   Prox Cx to Mid Cx lesion is 50% stenosed.   A stent was successfully placed.   A stent was successfully placed.   Post intervention, there is a 0% residual stenosis.   Post intervention, there is a 0% residual stenosis.   The left  ventricular systolic function is normal.   LV end diastolic pressure is normal.   The left ventricular ejection fraction is 55-65% by visual estimate.   There is no mitral valve regurgitation. Conclusion Successful left heart cath right radial approach because of unstable angina Normal left ventricular function EF 55% mild left ventricular enlargement Coronaries Left main minor irregularities LAD large 80% proximal 70% mid Circumflex large moderate disease 50% mid circumflex diffuse RCA large 90% distal RCA prior to bifurcation of PL and PDA All vessels with TIMI-3 flow Right dominant system Intervention Successful PCI and stent proximal LAD and mid LAD with overlapping stents mid LAD 2.75 x 38 proximal LAD 3.0 x 18 Onyx frontiers Lesions reduced from 80 to 0% and 70- 0% Recommend staged procedure for distal RCA to be done prior to discharge Patient was treated with Aggrastat for 12 hours Maintained on aspirin and Brilinta Patient tolerated procedure well No complications   ECHOCARDIOGRAM COMPLETE  Result Date: 06/24/2022    ECHOCARDIOGRAM REPORT   Patient Name:   HAKOP HUMBARGER [facility] Date of Exam: 06/24/2022 Medical Rec #:  [id number]             Height:       71.0 in Accession #:    [id number]            Weight:       310.0 lb Date of Birth:  Dec 01, 1965             BSA:          2.540 m Patient Age:    5 years              BP:           128/83 mmHg Patient Gender: M                     HR:           81 bpm. Exam Location:  [facility] Procedure: 2D Echo, Color Doppler, Cardiac Doppler and Intracardiac            Opacification Agent Indications:     I21.4 NSTEMI  History:         Patient has no prior history of Echocardiogram examinations.                  Risk Factors:Hypertension, Diabetes and Dyslipidemia.  Sonographer:     Charmayne Sheer Referring Phys:  [id number] LILY MICHELLE  TANG Diagnosing Phys: Yolonda Kida MD  Sonographer Comments: Technically difficult study due to poor echo windows. Image acquisition  challenging due to patient body habitus. IMPRESSIONS  1. Left ventricular ejection fraction, by estimation, is 60 to 65%. The left ventricle has normal function. The left ventricle has no regional wall motion abnormalities. The left ventricular internal cavity size was moderately dilated. There is moderate  left ventricular hypertrophy. Left ventricular diastolic parameters are consistent with Grade I diastolic dysfunction (impaired relaxation).  2. Right ventricular systolic function is normal. The right ventricular size is normal.  3. Left atrial size was mildly dilated.  4. Right atrial size was mildly dilated.  5. The mitral valve is normal in structure. No evidence of mitral valve regurgitation.  6. The aortic valve is grossly normal. Aortic valve regurgitation is mild to moderate. FINDINGS  Left Ventricle: Left ventricular ejection fraction, by estimation, is 60 to 65%. The left ventricle has normal function. The left ventricle has no regional wall motion abnormalities. Definity contrast agent was given IV to delineate the left ventricular  endocardial borders. The left ventricular internal cavity size was moderately dilated. There is moderate left ventricular hypertrophy. Left ventricular diastolic parameters are consistent with Grade I diastolic dysfunction (impaired relaxation). Right Ventricle: The right ventricular size is normal. No increase in right ventricular wall thickness. Right ventricular systolic function is normal. Left Atrium: Left atrial size was mildly dilated. Right Atrium: Right atrial size was mildly dilated. Pericardium: There is no evidence of pericardial effusion. Mitral Valve: The mitral valve is normal in structure. No evidence of mitral valve regurgitation. Tricuspid Valve: The tricuspid valve is normal in structure. Tricuspid valve regurgitation is trivial. Aortic Valve: The aortic valve is grossly normal. Aortic valve regurgitation is mild to moderate. Aortic valve mean gradient  measures 5.0 mmHg. Aortic valve peak gradient measures 9.5 mmHg. Aortic valve area, by VTI measures 3.38 cm. Pulmonic Valve: The pulmonic valve was normal in structure. Pulmonic valve regurgitation is not visualized. Aorta: The ascending aorta was not well visualized. IAS/Shunts: No atrial level shunt detected by color flow Doppler.  LEFT VENTRICLE PLAX 2D LVIDd:         5.20 cm   Diastology LVIDs:         3.40 cm   LV e' medial:    6.09 cm/s LV PW:         1.90 cm   LV E/e' medial:  13.0 LV IVS:        1.20 cm   LV e' lateral:   6.42 cm/s LVOT diam:     2.40 cm   LV E/e' lateral: 12.3 LV SV:         86 LV SV Index:   34 LVOT Area:     4.52 cm  LEFT ATRIUM           Index LA diam:      4.00 cm 1.57 cm/m LA Vol (A4C): 98.5 ml 38.78 ml/m  AORTIC VALVE                     PULMONIC VALVE AV Area (Vmax):    3.03 cm      PV Vmax:       0.99 m/s AV Area (Vmean):   2.99 cm      PV Vmean:      70.400 cm/s AV Area (VTI):     3.38 cm      PV VTI:  0.167 m AV Vmax:           154.00 cm/s   PV Peak grad:  3.9 mmHg AV Vmean:          110.000 cm/s  PV Mean grad:  2.0 mmHg AV VTI:            0.256 m AV Peak Grad:      9.5 mmHg AV Mean Grad:      5.0 mmHg LVOT Vmax:         103.00 cm/s LVOT Vmean:        72.600 cm/s LVOT VTI:          0.191 m LVOT/AV VTI ratio: 0.75  AORTA Ao Root diam: 4.00 cm MITRAL VALVE MV Area (PHT): 3.58 cm     SHUNTS MV Decel Time: 212 msec     Systemic VTI:  0.19 m MV E velocity: 79.10 cm/s   Systemic Diam: 2.40 cm MV A velocity: 129.00 cm/s MV E/A ratio:  0.61 Dwayne D Callwood MD Electronically signed by Yolonda Kida MD Signature Date/Time: 06/24/2022/4:06:35 PM    Final    DG Chest 2 View  Result Date: 06/23/2022 CLINICAL DATA:  Chest pain radiating to the back. EXAM: CHEST - 2 VIEW COMPARISON:  Radiograph 01/16/2013 FINDINGS: The heart is normal in size. Normal mediastinal contours. Mild bronchial thickening with borderline hyperinflation. No focal airspace disease, pleural  effusion or pneumothorax. IMPRESSION: Mild bronchial thickening with borderline hyperinflation, can be seen with bronchitis or asthma. Electronically Signed   By: Keith Rake M.D.   On: 06/23/2022 22:37      Subjective: Seen and examined on the day of discharge.  Stable no distress.  Chest pain-free.  Stable for discharge home.  Discharge Exam: Vitals:   06/27/22 0425 06/27/22 0817  BP: (!) 145/78 (!) 142/73  Pulse: 89 79  Resp: 18 18  Temp: 98.2 F (36.8 C) 98 F (36.7 C)  SpO2: 98% 96%   Vitals:   06/26/22 2100 06/26/22 2213 06/27/22 0425 06/27/22 0817  BP: 136/76 (!) 168/95 (!) 145/78 (!) 142/73  Pulse: 77 86 89 79  Resp: _0 Temp:  98.5 F (36.9 C) 98.2 F (36.8 C) 98 F (36.7 C)  TempSrc:  Oral    SpO2: 95% 95% 98% 96%  Weight:      Height:        General: Pt is alert, awake, not in acute distress Cardiovascular: RRR, S1/S2 +, no rubs, no gallops Respiratory: CTA bilaterally, no wheezing, no rhonchi Abdominal: Soft, NT, ND, bowel sounds + Extremities: no edema, no cyanosis    The results of significant diagnostics from this hospitalization (including imaging, microbiology, ancillary and laboratory) are listed below for reference.     Microbiology: Recent Results (from the past 240 hour(s))  Resp Panel by RT-PCR (Flu A&B, Covid) Anterior Nasal Swab     Status: None   Collection Time: 06/24/22  6:24 AM   Specimen: Anterior Nasal Swab  Result Value Ref Range Status   SARS Coronavirus 2 by RT PCR NEGATIVE NEGATIVE Final    Comment: (NOTE) SARS-CoV-2 target nucleic acids are NOT DETECTED.  The SARS-CoV-2 RNA is generally detectable in upper respiratory specimens during the acute phase of infection. The lowest concentration of SARS-CoV-2 viral copies this assay can detect is 138 copies/mL. A negative result does not preclude SARS-Cov-2 infection and should not be used as the sole basis for treatment or other patient management decisions. A  negative result may occur with  improper specimen collection/handling, submission of specimen other than nasopharyngeal swab, presence of viral mutation(s) within the areas targeted by this assay, and inadequate number of viral copies(<138 copies/mL). A negative result must be combined with clinical observations, patient history, and epidemiological information. The expected result is Negative.  Fact Sheet for Patients:  EntrepreneurPulse.com.au  Fact Sheet for Healthcare Providers:  IncredibleEmployment.be  This test is no t yet approved or cleared by the [place] FDA and  has been authorized for detection and/or diagnosis of SARS-CoV-2 by FDA under an Emergency Use Authorization (EUA). This EUA will remain  in effect (meaning this test can be used) for the duration of the COVID-19 declaration under Section 564(b)(1) of the Act, 21 U.S.C.section 360bbb-3(b)(1), unless the authorization is terminated  or revoked sooner.       Influenza A by PCR NEGATIVE NEGATIVE Final   Influenza B by PCR NEGATIVE NEGATIVE Final    Comment: (NOTE) The Xpert Xpress SARS-CoV-2/FLU/RSV plus assay is intended as an aid in the diagnosis of influenza from Nasopharyngeal swab specimens and should not be used as a sole basis for treatment. Nasal washings and aspirates are unacceptable for Xpert Xpress SARS-CoV-2/FLU/RSV testing.  Fact Sheet for Patients: EntrepreneurPulse.com.au  Fact Sheet for Healthcare Providers: IncredibleEmployment.be  This test is not yet approved or cleared by the [place] FDA and has been authorized for detection and/or diagnosis of SARS-CoV-2 by FDA under an Emergency Use Authorization (EUA). This EUA will remain in effect (meaning this test can be used) for the duration of the COVID-19 declaration under Section 564(b)(1) of the Act, 21 U.S.C. section 360bbb-3(b)(1), unless the authorization  is terminated or revoked.  Performed at [facility], [address] [place],  [address]      Labs: BNP (last 3 results) No results for input(s): "BNP" in the last 8760 hours. Basic Metabolic Panel: Recent Labs  Lab 06/23/22 2217 06/25/22 0604 06/26/22 0329 06/27/22 0352  NA 138 137 139 137  K 3.5 3.5 3.7 3.4*  CL 104 105 110 106  CO2 _0 GLUCOSE 231* 189* 172* 173*  BUN _1 CREATININE 0.81 0.64 0.51* 0.63  CALCIUM 9.1 8.7* 8.6* 8.4*   Liver Function Tests: No results for input(s): "AST", "ALT", "ALKPHOS", "BILITOT", "PROT", "ALBUMIN" in the last 168 hours. No results for input(s): "LIPASE", "AMYLASE" in the last 168 hours. No results for input(s): "AMMONIA" in the last 168 hours. CBC: Recent Labs  Lab 06/23/22 2217 06/25/22 0604 06/25/22 2030 06/26/22 0329 06/27/22 0352  WBC 10.8* 12.7*  --  11.9* 11.8*  HGB 15.1 14.3  --  13.9 13.2  HCT 45.5 43.4  --  41.9 40.4  MCV 91.0 91.9  --  90.7 92.9  PLT 272 259 270 296 256   Cardiac Enzymes: No results for input(s): "CKTOTAL", "CKMB", "CKMBINDEX", "TROPONINI" in the last 168 hours. BNP: Invalid input(s): "POCBNP" CBG: Recent Labs  Lab 06/26/22 0913 06/26/22 1144 06/26/22 1525 06/26/22 2009 06/27/22 0818  GLUCAP 174* 206* 155* 198* 200*   D-Dimer No results for input(s): "DDIMER" in the last 72 hours. Hgb A1c No results for input(s): "HGBA1C" in the last 72 hours. Lipid Profile Recent Labs    06/25/22 0604  CHOL 182  HDL 26*  LDLCALC 88  TRIG 340*  CHOLHDL 7.0   Thyroid function studies No results for input(s): "TSH", "T4TOTAL", "T3FREE", "THYROIDAB" in the last 72 hours.  Invalid input(s): "FREET3"  Anemia work up No results for input(s): "VITAMINB12", "FOLATE", "FERRITIN", "TIBC", "IRON", "RETICCTPCT" in the last 72 hours. Urinalysis    Component Value Date/Time   COLORURINE YELLOW 05/04/2020 0922   APPEARANCEUR Sl Cloudy (A) 05/04/2020 0922    APPEARANCEUR Hazy 04/03/2012 0228   LABSPEC 1.020 05/04/2020 0922   LABSPEC 1.025 04/03/2012 0228   PHURINE 6.0 05/04/2020 0922   GLUCOSEU >=1000 (A) 05/04/2020 0922   HGBUR SMALL (A) 05/04/2020 0922   BILIRUBINUR NEGATIVE 05/04/2020 0922   BILIRUBINUR Negative 04/03/2012 0228   KETONESUR NEGATIVE 05/04/2020 0922   PROTEINUR Negative 04/03/2012 0228   UROBILINOGEN 0.2 05/04/2020 0922   NITRITE NEGATIVE 05/04/2020 0922   LEUKOCYTESUR NEGATIVE 05/04/2020 0922   LEUKOCYTESUR Negative 04/03/2012 0228   Sepsis Labs Recent Labs  Lab 06/23/22 2217 06/25/22 0604 06/26/22 0329 06/27/22 0352  WBC 10.8* 12.7* 11.9* 11.8*   Microbiology Recent Results (from the past 240 hour(s))  Resp Panel by RT-PCR (Flu A&B, Covid) Anterior Nasal Swab     Status: None   Collection Time: 06/24/22  6:24 AM   Specimen: Anterior Nasal Swab  Result Value Ref Range Status   SARS Coronavirus 2 by RT PCR NEGATIVE NEGATIVE Final    Comment: (NOTE) SARS-CoV-2 target nucleic acids are NOT DETECTED.  The SARS-CoV-2 RNA is generally detectable in upper respiratory specimens during the acute phase of infection. The lowest concentration of SARS-CoV-2 viral copies this assay can detect is 138 copies/mL. A negative result does not preclude SARS-Cov-2 infection and should not be used as the sole basis for treatment or other patient management decisions. A negative result may occur with  improper specimen collection/handling, submission of specimen other than nasopharyngeal swab, presence of viral mutation(s) within the areas targeted by this assay, and inadequate number of viral copies(<138 copies/mL). A negative result must be combined with clinical observations, patient history, and epidemiological information. The expected result is Negative.  Fact Sheet for Patients:  EntrepreneurPulse.com.au  Fact Sheet for Healthcare Providers:  IncredibleEmployment.be  This test is  no t yet approved or cleared by the [place] FDA and  has been authorized for detection and/or diagnosis of SARS-CoV-2 by FDA under an Emergency Use Authorization (EUA). This EUA will remain  in effect (meaning this test can be used) for the duration of the COVID-19 declaration under Section 564(b)(1) of the Act, 21 U.S.C.section 360bbb-3(b)(1), unless the authorization is terminated  or revoked sooner.       Influenza A by PCR NEGATIVE NEGATIVE Final   Influenza B by PCR NEGATIVE NEGATIVE Final    Comment: (NOTE) The Xpert Xpress SARS-CoV-2/FLU/RSV plus assay is intended as an aid in the diagnosis of influenza from Nasopharyngeal swab specimens and should not be used as a sole basis for treatment. Nasal washings and aspirates are unacceptable for Xpert Xpress SARS-CoV-2/FLU/RSV testing.  Fact Sheet for Patients: EntrepreneurPulse.com.au  Fact Sheet for Healthcare Providers: IncredibleEmployment.be  This test is not yet approved or cleared by the [place] FDA and has been authorized for detection and/or diagnosis of SARS-CoV-2 by FDA under an Emergency Use Authorization (EUA). This EUA will remain in effect (meaning this test can be used) for the duration of the COVID-19 declaration under Section 564(b)(1) of the Act, 21 U.S.C. section 360bbb-3(b)(1), unless the authorization is terminated or revoked.  Performed at [facility], [address], [place], [place] [address]      Time coordinating discharge: Over 30 minutes  SIGNED:   Sidney Ace, MD  [facility]  06/27/2022, 12:30 PM Pager   If 7PM-7AM, please contact night-coverage

## 2022-06-27 NOTE — Progress Notes (Signed)
[place] NOTE       Patient ID: Connor Ross MRN: [id number] DOB/AGE: 1966/03/28 56 y.o.  Admit date: 06/24/2022 Referring Physician  Dr. Francine Graven Primary Physician Dr. [name] Primary Cardiologist Dr. Ubaldo Glassing (last seen ?13 yrs ago)  Reason for Consultation NSTEMI  HPI: Connor "Connor Ross is a [phone] with a PMH of DM2 (A1c 8%), HTN, HLD, obesity, OSA (compliant w/ CPAP), herniated discs who presented to [facility] ED 06/24/2022 after several episodes of chest pain.  Troponins elevated and trending 70, 110, 172 and EKG with isolated STE in lead III with ST depressions laterally, ruled in for NSTEMI. LHC on 12/12 revealed 80% prox LAD & 70% mid LAD, 90% RCA, 50% Lcx. S/p PCI and 2 overlapping DES to LAD on 12/12 and staged PCI with DES x 1 to RCA on 12/13. Echo with preserved LVEF 60-65% and g1DD.   Interval History:  -no acute events -discussed cath results in detail  -no chest pain, shortness of breath. Several loose stools this morning, he thinks is related to ham/turkey sandwiches and being NPO the past two days -eager to go home  Review of systems complete and found to be negative unless listed above   Past Medical History:  Diagnosis Date   Diabetes mellitus    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 type 2 diabetes mellitus, limited to breakdown of skin ([place]) 04/23/2019   Fracture of one rib, left side, subsequent encounter for fracture with routine healing 08/27/2020   Hyperlipidemia    Hypertension    Normal cardiac stress test 2012   [place]    Past Surgical History:  Procedure Laterality Date   CORONARY STENT INTERVENTION N/A 06/25/2022   Procedure: CORONARY STENT INTERVENTION;  Surgeon: Yolonda Kida, MD;  Location: [place] [facility];  Service: Cardiovascular;  Laterality: N/A;   EXOSTECTOMY Right 06/2019   5th MT head  ([facility])   LEFT HEART CATH AND CORONARY ANGIOGRAPHY N/A 06/25/2022   Procedure: LEFT HEART CATH  AND CORONARY ANGIOGRAPHY WITH POSSIBLE STENT;  Surgeon: Yolonda Kida, MD;  Location: [place] [facility];  Service: Cardiovascular;  Laterality: N/A;  12:30 following DC first case   LUMBAR DISC SURGERY      Medications Prior to Admission  Medication Sig Dispense Refill Last Dose   amLODipine (NORVASC) 2.5 MG tablet TAKE 1 TABLET EVERY DAY (Patient taking differently: Take 2.5 mg by mouth daily.) 90 tablet 3 06/23/2022   ASPIRIN LOW DOSE 81 MG EC tablet TAKE 1 TABLET BY MOUTH  DAILY (SWALLOW WHOLE) 90 tablet 1 06/23/2022   furosemide (LASIX) 20 MG tablet TAKE 1 TABLET (20 MG TOTAL) BY MOUTH EVERY OTHER DAY. (Patient taking differently: Take 20 mg by mouth daily.) 45 tablet 3 06/23/2022   gabapentin (NEURONTIN) 100 MG capsule Take 2 capsules (200 mg total) by mouth 3 (three) times daily. (Patient taking differently: Take 200 mg by mouth every 4 (four) hours as needed (pain).) 540 capsule 0 06/23/2022   insulin aspart (NOVOLOG FLEXPEN) 100 UNIT/ML FlexPen Inject up to 25 units with breakfast, 25 units with lunch, and 30 units with supper 72 mL 2 06/23/2022   insulin degludec (TRESIBA FLEXTOUCH) 200 UNIT/ML FlexTouch Pen Inject 88 Units into the skin daily. (Patient taking differently: Inject 86 Units into the skin daily.) 48 mL 2 06/23/2022   JARDIANCE 25 MG TABS tablet TAKE 1 TABLET EVERY DAY (Patient taking differently: Take 25 mg by mouth daily.) 90 tablet 3  06/23/2022   losartan (COZAAR) 100 MG tablet TAKE 1 TABLET (100 MG TOTAL) DAILY. (Patient taking differently: Take 100 mg by mouth daily.) 90 tablet 1 06/23/2022   rosuvastatin (CRESTOR) 10 MG tablet TAKE 1 TABLET EVERY DAY (Patient taking differently: Take 10 mg by mouth daily. TAKE 1 TABLET EVERY DAY) 90 tablet 3 06/23/2022   tamsulosin (FLOMAX) 0.4 MG CAPS capsule TAKE 1 CAPSULE EVERY DAY (Patient taking differently: Take 0.4 mg by mouth daily.) 90 capsule 3 06/23/2022   traMADol (ULTRAM) 50 MG tablet TAKE 1 TABLET EVERY 12 HOURS AS  NEEDED (Patient taking differently: Take 50 mg by mouth every 12 (twelve) hours as needed for moderate pain.) 60 tablet 5 06/23/2022   blood glucose meter kit and supplies Dispense based on patient and insurance preference. Use up to four times daily as directed. (FOR ICD-10 E10.9, E11.9). Use to check blood sugar three times daily 1 each 0    clobetasol cream (TEMOVATE) 0.05 % Apply topically 2 (two) times daily.   prn   Continuous Blood Gluc Sensor (FREESTYLE LIBRE 2 SENSOR) MISC Use to check sugar at least 4 times daily 1 each 2    fluticasone (FLONASE) 50 MCG/ACT nasal spray PLACE 2 SPRAYS INTO BOTH NOSTRILS DAILY. AT NIGHT AFTER NASAL SALINE 16 g 2 prn   Insulin Pen Needle 32G X 6 MM MISC Use daily with insulin pen 50 each 0    ketoconazole (NIZORAL) 2 % cream SMARTSIG:1 Application Topical 1 to 2 Times Daily   prn   Lancets (FREESTYLE) lancets 1 each by Other route 2 (two) times daily. DX 250.02 100 each 12    ONETOUCH VERIO test strip USE TO CHECK BLOOD SUGAR 3  TIMES DAILY 300 strip 3    Semaglutide, 2 MG/DOSE, (OZEMPIC, 2 MG/DOSE,) 8 MG/3ML SOPN Inject 2 mg into the skin once a week. 8 mL 2 06/17/2022   sildenafil (VIAGRA) 50 MG tablet Take 1 tablet (50 mg total) by mouth daily as needed for erectile dysfunction. 10 tablet 5 prn   triamcinolone (KENALOG) 0.025 % cream Apply topically.   prn    Social History   Socioeconomic History   Marital status: Married    Spouse name: Not on file   Number of children: Not on file   Years of education: Not on file   Highest education level: Not on file  Occupational History   Not on file  Tobacco Use   Smoking status: Never   Smokeless tobacco: Never  Substance and Sexual Activity   Alcohol use: No   Drug use: No   Sexual activity: Not on file  Other Topics Concern   Not on file  Social History Narrative   Not on file   Social Determinants of Health   Financial Resource Strain: Low Risk  (01/07/2022)   Overall Financial Resource  Strain (CARDIA)    Difficulty of Paying Living Expenses: Not very hard  Food Insecurity: No Food Insecurity (06/25/2022)   Hunger Vital Sign    Worried About Running Out of Food in the Last Year: Never true    Ran Out of Food in the Last Year: Never true  Transportation Needs: No Transportation Needs (06/25/2022)   PRAPARE - Hydrologist (Medical): No    Lack of Transportation (Non-Medical): No  Physical Activity: Insufficiently Active (01/07/2022)   Exercise Vital Sign    Days of Exercise per Week: 2 days    Minutes of Exercise per Session: 60  min  Stress: No Stress Concern Present (01/07/2022)   [place]    Feeling of Stress : Not at all  Social Connections: Moderately Integrated (01/07/2022)   Social Connection and Isolation Panel [NHANES]    Frequency of Communication with Friends and Family: More than three times a week    Frequency of Social Gatherings with Friends and Family: Patient refused    Attends Religious Services: 1 to 4 times per year    Active Member of [employer] or Organizations: No    Attends Archivist Meetings: Not on file    Marital Status: Married  Human resources officer Violence: Not At Risk (06/25/2022)   Humiliation, Afraid, Rape, and Kick questionnaire    Fear of Current or Ex-Partner: No    Emotionally Abused: No    Physically Abused: No    Sexually Abused: No    Family History  Problem Relation Age of Onset   Coronary artery disease Mother 91   COPD Mother    Heart disease Mother    Hyperlipidemia Mother    Hypertension Mother    Cancer Father    Hyperlipidemia Father    Hypertension Brother    Cancer Brother        lymphoma   Diabetes Maternal Aunt    Diabetes Maternal Uncle    Diabetes Maternal Grandmother    Heart disease Maternal Grandmother       Intake/Output Summary (Last 24 hours) at 06/27/2022 0939 Last data filed at 06/26/2022  1845 Gross per 24 hour  Intake --  Output 450 ml  Net -450 ml    Vitals:   06/26/22 2100 06/26/22 2213 06/27/22 0425 06/27/22 0817  BP: 136/76 (!) 168/95 (!) 145/78 (!) 142/73  Pulse: 77 86 89 79  Resp: _0 Temp:  98.5 F (36.9 C) 98.2 F (36.8 C) 98 F (36.7 C)  TempSrc:  Oral    SpO2: 95% 95% 98% 96%  Weight:      Height:        PHYSICAL EXAM General: Pleasant Caucasian male, well nourished, in no acute distress.  Sitting upright in PCU bed.  HEENT:  Normocephalic and atraumatic. Neck:  No JVD.  Lungs: Normal respiratory effort on room air. Clear bilaterally to auscultation. No wheezes, crackles, rhonchi.  Heart: HRRR . Normal S1 and S2 without gallops or murmurs.  Abdomen: Non-distended appearing with excess adiposity.  Msk: Normal strength and tone for age. Extremities: Warm and well perfused. No clubbing, cyanosis.  No peripheral edema. R wrist with mild tenderness to palpation, without bleeding, significant ecchymosis. Gauze and tegaderm in place.  Neuro: Alert and oriented X 3. Psych:  Answers questions appropriately.   Labs: Basic Metabolic Panel: Recent Labs    06/26/22 0329 06/27/22 0352  NA 139 137  K 3.7 3.4*  CL 110 106  CO2 23 24  GLUCOSE 172* 173*  BUN 13 13  CREATININE 0.51* 0.63  CALCIUM 8.6* 8.4*    Liver Function Tests: No results for input(s): "AST", "ALT", "ALKPHOS", "BILITOT", "PROT", "ALBUMIN" in the last 72 hours. No results for input(s): "LIPASE", "AMYLASE" in the last 72 hours. CBC: Recent Labs    06/26/22 0329 06/27/22 0352  WBC 11.9* 11.8*  HGB 13.9 13.2  HCT 41.9 40.4  MCV 90.7 92.9  PLT 296 256    Cardiac Enzymes: Recent Labs    06/24/22 1450 06/25/22 0604  TROPONINIHS 292* 158*    BNP: No  results for input(s): "BNP" in the last 72 hours. D-Dimer: No results for input(s): "DDIMER" in the last 72 hours. Hemoglobin A1C: No results for input(s): "HGBA1C" in the last 72 hours. Fasting Lipid  Panel: Recent Labs    06/25/22 0604  CHOL 182  HDL 26*  LDLCALC 88  TRIG 340*  CHOLHDL 7.0   Thyroid Function Tests: No results for input(s): "TSH", "T4TOTAL", "T3FREE", "THYROIDAB" in the last 72 hours.  Invalid input(s): "FREET3" Anemia Panel: No results for input(s): "VITAMINB12", "FOLATE", "FERRITIN", "TIBC", "IRON", "RETICCTPCT" in the last 72 hours.   Radiology: CARDIAC CATHETERIZATION  Result Date: 06/26/2022   Prox LAD lesion is 80% stenosed.   Mid LAD lesion is 70% stenosed.   Dist RCA lesion is 90% stenosed.   Prox Cx to Mid Cx lesion is 50% stenosed.   A stent was successfully placed.   A stent was successfully placed.   Post intervention, there is a 0% residual stenosis.   Post intervention, there is a 0% residual stenosis.   The left ventricular systolic function is normal.   LV end diastolic pressure is normal.   The left ventricular ejection fraction is 55-65% by visual estimate.   There is no mitral valve regurgitation. Conclusion Successful left heart cath right radial approach because of unstable angina Normal left ventricular function EF 55% mild left ventricular enlargement Coronaries Left main minor irregularities LAD large 80% proximal 70% mid Circumflex large moderate disease 50% mid circumflex diffuse RCA large 90% distal RCA prior to bifurcation of PL and PDA All vessels with TIMI-3 flow Right dominant system Intervention Successful PCI and stent proximal LAD and mid LAD with overlapping stents mid LAD 2.75 x 38 proximal LAD 3.0 x 18 Onyx frontiers Lesions reduced from 80 to 0% and 70- 0% Recommend staged procedure for distal RCA to be done prior to discharge Patient was treated with Aggrastat for 12 hours Maintained on aspirin and Brilinta Patient tolerated procedure well No complications   ECHOCARDIOGRAM COMPLETE  Result Date: 06/24/2022    ECHOCARDIOGRAM REPORT   Patient Name:   BATES COLLINGTON [facility] Date of Exam: 06/24/2022 Medical Rec #:  [id number]              Height:       71.0 in Accession #:    [id number]            Weight:       310.0 lb Date of Birth:  [birth year]             BSA:          2.540 m Patient Age:    53 years              BP:           128/83 mmHg Patient Gender: M                     HR:           81 bpm. Exam Location:  [facility] Procedure: 2D Echo, Color Doppler, Cardiac Doppler and Intracardiac            Opacification Agent Indications:     I21.4 NSTEMI  History:         Patient has no prior history of Echocardiogram examinations.                  Risk Factors:Hypertension, Diabetes and Dyslipidemia.  Sonographer:     Charmayne Sheer Referring Phys:  [phone]  Keelyn Fjelstad MICHELLE Jesicca Dipierro Diagnosing Phys: Yolonda Kida MD  Sonographer Comments: Technically difficult study due to poor echo windows. Image acquisition challenging due to patient body habitus. IMPRESSIONS  1. Left ventricular ejection fraction, by estimation, is 60 to 65%. The left ventricle has normal function. The left ventricle has no regional wall motion abnormalities. The left ventricular internal cavity size was moderately dilated. There is moderate  left ventricular hypertrophy. Left ventricular diastolic parameters are consistent with Grade I diastolic dysfunction (impaired relaxation).  2. Right ventricular systolic function is normal. The right ventricular size is normal.  3. Left atrial size was mildly dilated.  4. Right atrial size was mildly dilated.  5. The mitral valve is normal in structure. No evidence of mitral valve regurgitation.  6. The aortic valve is grossly normal. Aortic valve regurgitation is mild to moderate. FINDINGS  Left Ventricle: Left ventricular ejection fraction, by estimation, is 60 to 65%. The left ventricle has normal function. The left ventricle has no regional wall motion abnormalities. Definity contrast agent was given IV to delineate the left ventricular  endocardial borders. The left ventricular internal cavity size was moderately dilated. There is moderate left  ventricular hypertrophy. Left ventricular diastolic parameters are consistent with Grade I diastolic dysfunction (impaired relaxation). Right Ventricle: The right ventricular size is normal. No increase in right ventricular wall thickness. Right ventricular systolic function is normal. Left Atrium: Left atrial size was mildly dilated. Right Atrium: Right atrial size was mildly dilated. Pericardium: There is no evidence of pericardial effusion. Mitral Valve: The mitral valve is normal in structure. No evidence of mitral valve regurgitation. Tricuspid Valve: The tricuspid valve is normal in structure. Tricuspid valve regurgitation is trivial. Aortic Valve: The aortic valve is grossly normal. Aortic valve regurgitation is mild to moderate. Aortic valve mean gradient measures 5.0 mmHg. Aortic valve peak gradient measures 9.5 mmHg. Aortic valve area, by VTI measures 3.38 cm. Pulmonic Valve: The pulmonic valve was normal in structure. Pulmonic valve regurgitation is not visualized. Aorta: The ascending aorta was not well visualized. IAS/Shunts: No atrial level shunt detected by color flow Doppler.  LEFT VENTRICLE PLAX 2D LVIDd:         5.20 cm   Diastology LVIDs:         3.40 cm   LV e' medial:    6.09 cm/s LV PW:         1.90 cm   LV E/e' medial:  13.0 LV IVS:        1.20 cm   LV e' lateral:   6.42 cm/s LVOT diam:     2.40 cm   LV E/e' lateral: 12.3 LV SV:         86 LV SV Index:   34 LVOT Area:     4.52 cm  LEFT ATRIUM           Index LA diam:      4.00 cm 1.57 cm/m LA Vol (A4C): 98.5 ml 38.78 ml/m  AORTIC VALVE                     PULMONIC VALVE AV Area (Vmax):    3.03 cm      PV Vmax:       0.99 m/s AV Area (Vmean):   2.99 cm      PV Vmean:      70.400 cm/s AV Area (VTI):     3.38 cm      PV VTI:  0.167 m AV Vmax:           154.00 cm/s   PV Peak grad:  3.9 mmHg AV Vmean:          110.000 cm/s  PV Mean grad:  2.0 mmHg AV VTI:            0.256 m AV Peak Grad:      9.5 mmHg AV Mean Grad:      5.0 mmHg LVOT  Vmax:         103.00 cm/s LVOT Vmean:        72.600 cm/s LVOT VTI:          0.191 m LVOT/AV VTI ratio: 0.75  AORTA Ao Root diam: 4.00 cm MITRAL VALVE MV Area (PHT): 3.58 cm     SHUNTS MV Decel Time: 212 msec     Systemic VTI:  0.19 m MV E velocity: 79.10 cm/s   Systemic Diam: 2.40 cm MV A velocity: 129.00 cm/s MV E/A ratio:  0.61 Dwayne D Callwood MD Electronically signed by Yolonda Kida MD Signature Date/Time: 06/24/2022/4:06:35 PM    Final    DG Chest 2 View  Result Date: 06/23/2022 CLINICAL DATA:  Chest pain radiating to the back. EXAM: CHEST - 2 VIEW COMPARISON:  Radiograph 01/16/2013 FINDINGS: The heart is normal in size. Normal mediastinal contours. Mild bronchial thickening with borderline hyperinflation. No focal airspace disease, pleural effusion or pneumothorax. IMPRESSION: Mild bronchial thickening with borderline hyperinflation, can be seen with bronchitis or asthma. Electronically Signed   By: Keith Rake M.D.   On: 06/23/2022 22:37    TELEMETRY reviewed by me (LT) 06/27/2022 : Sinus rhythm rate 80s to 90s, artifact  EKG reviewed by me: Initial EKG shows NSR with ST depressions in I, aVL, TWI in aVR and inferior Q waves, repeat shows NSR with isolated 21m STE lead III, and persistent STD I, aVL, and V1 & V2. Second repeat NSR with developing inferolateral Q waves.   Data reviewed by me (LT) 06/27/2022: hospitalist progress notes, cath reports,  EKGs, CBC BMP vitals telemetry   Principal Problem:   NSTEMI (non-ST elevated myocardial infarction) ([facility] Active Problems:   Morbid obesity with BMI of 45.0-49.9, adult ([place]   Spinal stenosis of lumbar region with radiculopathy   OSA on CPAP   Hypertension   Type 2 diabetes mellitus with hyperlipidemia ([facility]   ASSESSMENT AND PLAN:  Connor"KJaynee Eaglesis a [phone] a PMH of DM2 (A1c 8%), HTN, HLD, obesity, OSA (compliant w/ CPAP), herniated discs [facility] episodes of chest  pain.  Troponins elevated and trending 70, 110, 172 and EKG with isolated STE in lead III with ST depressions laterally, ruled in for NSTEMI. LHC on 12/12 revealed 80% prox LAD & 70% mid LAD, 90% RCA, 50% Lcx. S/p PCI and 2 overlapping DES to LAD on 12/12 and staged PCI with DES x 1 to distal RCA on 12/13. Echo with preserved LVEF 60-65% and g1DD.   # NSTEMI # 2 vCAD s/p PCI and 2 overlapping DES prox & mid LAD, distal RCA x 1  Presents with nonexertional chest pain at worst a 4-5/10 associated with diaphoresis and arm heaviness, radiating to his back and his neck, self terminating after 5 to 10 minutes.  He has all these symptoms 3 times within the past 24 hours, prompting his presentation to the ED. Troponins are elevated with a current peak of 172 with repeats pending,  EKG with isolated STE in lead III, and developing inferolateral Q waves - ruling in NSTEMI for which further evaluation with left heart catheterization is recommended. -S/p 325 mg aspirin - continue 81 mg aspirin daily and brillinta 50m BID uninterrupted for 1 year -s/p heparin drip  -continue metoprolol tartrate 25 mg twice daily -conitnue rosuvastatin 40 mg -Continue losartan 100 mg daily -Continue Lasix 20 mg every other daily -continue isosorbide 60 mg once daily -consider repatha/praluent outpatient -cardiac rehab -discussed risk factor modification with heart healthy/low cholesterol diet, DM control, and mobility/movement as his back pain permits -ok for discharge today from a cardiac standpoint. Follow up with Dr. CClayborn Bignessin 1-2 weeks.   # DM2 A1c is 8%, slight uptrend from 7.6% earlier this year. Continue Ozempic on an outpatient basis - restart Jardiance 25 mg daily at discharge  This patient's plan of care was discussed and created with Dr. CClayborn Bignessand he is in agreement.  Signed: LTristan Schroeder, PA-C 06/27/2022, 9:39 AM [facility]

## 2022-06-28 ENCOUNTER — Other Ambulatory Visit: Payer: Medicare HMO

## 2022-07-03 ENCOUNTER — Telehealth: Payer: Self-pay | Admitting: Pharmacy Technician

## 2022-07-03 ENCOUNTER — Ambulatory Visit (INDEPENDENT_AMBULATORY_CARE_PROVIDER_SITE_OTHER): Payer: Medicare HMO | Admitting: Internal Medicine

## 2022-07-03 ENCOUNTER — Ambulatory Visit: Payer: Medicare HMO | Admitting: Internal Medicine

## 2022-07-03 ENCOUNTER — Encounter: Payer: Self-pay | Admitting: Internal Medicine

## 2022-07-03 VITALS — BP 138/74 | HR 70 | Temp 97.7°F | Ht 71.0 in | Wt 303.8 lb

## 2022-07-03 DIAGNOSIS — Z09 Encounter for follow-up examination after completed treatment for conditions other than malignant neoplasm: Secondary | ICD-10-CM

## 2022-07-03 DIAGNOSIS — E1169 Type 2 diabetes mellitus with other specified complication: Secondary | ICD-10-CM | POA: Diagnosis not present

## 2022-07-03 DIAGNOSIS — E785 Hyperlipidemia, unspecified: Secondary | ICD-10-CM | POA: Diagnosis not present

## 2022-07-03 DIAGNOSIS — I214 Non-ST elevation (NSTEMI) myocardial infarction: Secondary | ICD-10-CM | POA: Diagnosis not present

## 2022-07-03 DIAGNOSIS — Z596 Low income: Secondary | ICD-10-CM

## 2022-07-03 DIAGNOSIS — I252 Old myocardial infarction: Secondary | ICD-10-CM

## 2022-07-03 MED ORDER — METOPROLOL TARTRATE 25 MG PO TABS: 25.0000 mg | ORAL_TABLET | Freq: Two times a day (BID) | ORAL | 1 refills | Status: DC

## 2022-07-03 MED ORDER — TICAGRELOR 90 MG PO TABS: 90.0000 mg | ORAL_TABLET | Freq: Two times a day (BID) | ORAL | 1 refills | Status: DC

## 2022-07-03 MED ORDER — ISOSORBIDE MONONITRATE ER 60 MG PO TB24: 60.0000 mg | ORAL_TABLET | Freq: Every day | ORAL | 1 refills | Status: DC

## 2022-07-03 MED ORDER — ROSUVASTATIN CALCIUM 40 MG PO TABS: 40.0000 mg | ORAL_TABLET | Freq: Every evening | ORAL | 1 refills | Status: DC

## 2022-07-03 NOTE — Progress Notes (Signed)
[facility])                                            Joanna Team    07/03/2022  SHRAGA CUSTARD 04-01-1966 [id number]   Received patient and provider portion(s) of patient assistance application(s) for [place], Novolog and Ozempic. Faxed completed application and required documents into [employer].   Patient is not interested in applying for Jardiance with BI. Patient informs has applied several times in the past and has been DENIED due to being over income. Based on income submitted for above application, patient is over income for this program.   Sharee Pimple P. Ashleigh Arya, [place]  [phone]

## 2022-07-03 NOTE — Patient Instructions (Addendum)
I recommend drinking a protein shake or herbal life shake instead of skipping breakfast  Check blood sugar before and after every  meal (even if not eating) .    Take insulin no matter what if pre meal sugar is 150 or higher  Use 15 units before a low carb or no carb breakfast ;   25 if breakfast is donuts or bagels   Use 15 units if skipping lunch,  25 if eating lunch    25 units before dinner   40 if having dessert or eating bread/potatoes   Increase daily dose of Tresiba to 92 units  Return in one week WITH YOUR METER  to have your  blood sugars down loaded

## 2022-07-03 NOTE — Progress Notes (Signed)
Subjective:  Patient ID: Connor Ross, male    DOB: May 03, 1966  Age: 56 y.o. MRN: [id number]  CC: The primary encounter diagnosis was Type 2 diabetes mellitus with hyperlipidemia ([place]). Diagnoses of History of non-ST elevation myocardial infarction (NSTEMI) and Hospital discharge follow-up were also pertinent to this visit.   HPI BRAYTON BAUMGARTNER presents for  Chief Complaint  Patient presents with   Hospitalization Follow-up   1) Connor Ross is a 56 yr old male with type 2 DM , obesity and hypertension who was admitted to [facility] on Dec 11 with chest pain , elevated troponins  secondary to NSTEMI .  He underwent diagnostic /therapeutic cardiac catheterization on Dec 12 with successful stenting x 2,of LAD lesions,  followed by a second cath on Dec 13 to address RCA lesion,  which was stented  I 2 locations resulting in  reduction of 95% stenosis to 0%stenosis of RCA   . He was discharged on Dec 13 to home and has not had any recurrent epsiode sof chest pain,  denies shortness of breath and is tolerating Brilinta without bleeding.    Cardiac cath and ECHO report reviewed .  LV function was normal .  He is taking the Brilinta 90 mg twice daily since discharge.  Medication regimen was changed and reviewed today. He is tolerating imdur,  Crestor,  and metoprolol  Hospitalization labs reviewed :  Troponin #1 was 70 (<18 ng/L)  on Dec 10 (ER) and peaked at 292 on Dec 11 .  A1c was rechecked and unchanged from September, 8.0.  he has been using the CBG monitor, Freestyle Libre 2. Blood sugars from the last 2 weeks were reviewed in detail . He contineues to take JARDIANCE AND OZEMPIC   Insulin regimen reviewed:  he has been using 20 to 25 units of novolog with breakfast (but often omits the dose because he skips breakfast ); breakfast [place] often donuts or bagel) , 20 to 25,  or  none at lunchtime if he skips lunch,  and slightly higher SSI with dinner.  Took 40 units last night before a chicken ,  veggies and rice .     Outpatient Medications Prior to Visit  Medication Sig Dispense Refill   ASPIRIN LOW DOSE 81 MG EC tablet TAKE 1 TABLET BY MOUTH  DAILY (SWALLOW WHOLE) 90 tablet 1   blood glucose meter kit and supplies Dispense based on patient and insurance preference. Use up to four times daily as directed. (FOR ICD-10 E10.9, E11.9). Use to check blood sugar three times daily 1 each 0   clobetasol cream (TEMOVATE) 0.05 % Apply topically 2 (two) times daily.     Continuous Blood Gluc Sensor (FREESTYLE LIBRE 2 SENSOR) MISC Use to check sugar at least 4 times daily 1 each 2   fluticasone (FLONASE) 50 MCG/ACT nasal spray PLACE 2 SPRAYS INTO BOTH NOSTRILS DAILY. AT NIGHT AFTER NASAL SALINE 16 g 2   furosemide (LASIX) 20 MG tablet TAKE 1 TABLET (20 MG TOTAL) BY MOUTH EVERY OTHER DAY. (Patient taking differently: Take 20 mg by mouth daily.) 45 tablet 3   gabapentin (NEURONTIN) 100 MG capsule Take 2 capsules (200 mg total) by mouth 3 (three) times daily. (Patient taking differently: Take 200 mg by mouth every 4 (four) hours as needed (pain).) 540 capsule 0   insulin aspart (NOVOLOG FLEXPEN) 100 UNIT/ML FlexPen Inject up to 25 units with breakfast, 25 units with lunch, and 30 units with supper 72 mL 2  insulin degludec (TRESIBA FLEXTOUCH) 200 UNIT/ML FlexTouch Pen Inject 88 Units into the skin daily. (Patient taking differently: Inject 86 Units into the skin daily.) 48 mL 2   Insulin Pen Needle 32G X 6 MM MISC Use daily with insulin pen 50 each 0   JARDIANCE 25 MG TABS tablet TAKE 1 TABLET EVERY DAY (Patient taking differently: Take 25 mg by mouth daily.) 90 tablet 3   ketoconazole (NIZORAL) 2 % cream SMARTSIG:1 Application Topical 1 to 2 Times Daily     Lancets (FREESTYLE) lancets 1 each by Other route 2 (two) times daily. DX 250.02 100 each 12   losartan (COZAAR) 100 MG tablet TAKE 1 TABLET (100 MG TOTAL) DAILY. (Patient taking differently: Take 100 mg by mouth daily.) 90 tablet 1   ONETOUCH  VERIO test strip USE TO CHECK BLOOD SUGAR 3  TIMES DAILY 300 strip 3   Semaglutide, 2 MG/DOSE, (OZEMPIC, 2 MG/DOSE,) 8 MG/3ML SOPN Inject 2 mg into the skin once a week. 8 mL 2   tamsulosin (FLOMAX) 0.4 MG CAPS capsule TAKE 1 CAPSULE EVERY DAY (Patient taking differently: Take 0.4 mg by mouth daily.) 90 capsule 3   traMADol (ULTRAM) 50 MG tablet TAKE 1 TABLET EVERY 12 HOURS AS NEEDED (Patient taking differently: Take 50 mg by mouth every 12 (twelve) hours as needed for moderate pain.) 60 tablet 5   triamcinolone (KENALOG) 0.025 % cream Apply topically.     isosorbide mononitrate (IMDUR) 60 MG 24 hr tablet Take 1 tablet (60 mg total) by mouth daily. 30 tablet 0   metoprolol tartrate (LOPRESSOR) 25 MG tablet Take 1 tablet (25 mg total) by mouth 2 (two) times daily. 60 tablet 0   rosuvastatin (CRESTOR) 40 MG tablet Take 1 tablet (40 mg total) by mouth every evening. 30 tablet 0   sildenafil (VIAGRA) 50 MG tablet Take 1 tablet (50 mg total) by mouth daily as needed for erectile dysfunction. 10 tablet 5   ticagrelor (BRILINTA) 90 MG TABS tablet Take 1 tablet (90 mg total) by mouth 2 (two) times daily. 60 tablet 0   loperamide (IMODIUM A-D) 2 MG tablet Take 1 tablet (2 mg total) by mouth 4 (four) times daily as needed for diarrhea or loose stools. 30 tablet 0   No facility-administered medications prior to visit.    Review of Systems;  Patient denies headache, fevers, malaise, unintentional weight loss, skin rash, eye pain, sinus congestion and sinus pain, sore throat, dysphagia,  hemoptysis , cough, dyspnea, wheezing, chest pain, palpitations, orthopnea, edema, abdominal pain, nausea, melena, diarrhea, constipation, flank pain, dysuria, hematuria, urinary  Frequency, nocturia, numbness, tingling, seizures,  Focal weakness, Loss of consciousness,  Tremor, insomnia, depression, anxiety, and suicidal ideation.      Objective:  BP 138/74   Pulse 70   Temp 97.7 F (36.5 C) (Oral)   Ht 5' 11" (1.803  m)   Wt (!) 303 lb 12.8 oz (137.8 kg)   SpO2 95%   BMI 42.37 kg/m   BP Readings from Last 3 Encounters:  07/03/22 138/74  06/27/22 (!) 142/73  04/03/22 130/68    Wt Readings from Last 3 Encounters:  07/03/22 (!) 303 lb 12.8 oz (137.8 kg)  06/25/22 (!) 308 lb 10.3 oz (140 kg)  04/03/22 (!) 319 lb 6.4 oz (144.9 kg)    Physical Exam Vitals reviewed.  Constitutional:      General: He is not in acute distress.    Appearance: Normal appearance. He is normal weight. He is not  ill-appearing, toxic-appearing or diaphoretic.  HENT:     Head: Normocephalic.  Eyes:     General: No scleral icterus.       Right eye: No discharge.        Left eye: No discharge.     Conjunctiva/sclera: Conjunctivae normal.  Cardiovascular:     Rate and Rhythm: Normal rate and regular rhythm.     Heart sounds: Normal heart sounds.  Pulmonary:     Effort: Pulmonary effort is normal. No respiratory distress.     Breath sounds: Normal breath sounds.  Musculoskeletal:        General: Normal range of motion.     Cervical back: Normal range of motion.  Skin:    General: Skin is warm and dry.  Neurological:     General: No focal deficit present.     Mental Status: He is alert and oriented to person, place, and time. Mental status is at baseline.  Psychiatric:        Mood and Affect: Mood normal.        Behavior: Behavior normal.        Thought Content: Thought content normal.        Judgment: Judgment normal.     Lab Results  Component Value Date   HGBA1C 8.0 (H) 03/27/2022   HGBA1C 7.6 (H) 12/21/2021   HGBA1C 8.2 (H) 09/07/2021    Lab Results  Component Value Date   CREATININE 0.63 06/27/2022   CREATININE 0.51 (L) 06/26/2022   CREATININE 0.64 06/25/2022    Lab Results  Component Value Date   WBC 11.8 (H) 06/27/2022   HGB 13.2 06/27/2022   HCT 40.4 06/27/2022   PLT 256 06/27/2022   GLUCOSE 173 (H) 06/27/2022   CHOL 182 06/25/2022   TRIG 340 (H) 06/25/2022   HDL 26 (L) 06/25/2022    LDLDIRECT 106.0 03/27/2022   LDLCALC 88 06/25/2022   ALT 28 03/27/2022   AST 12 03/27/2022   NA 137 06/27/2022   K 3.4 (L) 06/27/2022   CL 106 06/27/2022   CREATININE 0.63 06/27/2022   BUN 13 06/27/2022   CO2 24 06/27/2022   TSH 2.29 03/27/2022   PSA 0.47 03/27/2022   INR 1.0 06/24/2022   HGBA1C 8.0 (H) 03/27/2022   MICROALBUR 14.5 (H) 12/21/2021    CARDIAC CATHETERIZATION  Result Date: 06/26/2022   Prox LAD lesion is 80% stenosed.   Mid LAD lesion is 70% stenosed.   Dist RCA lesion is 90% stenosed.   Prox Cx to Mid Cx lesion is 50% stenosed.   A stent was successfully placed.   A stent was successfully placed.   Post intervention, there is a 0% residual stenosis.   Post intervention, there is a 0% residual stenosis.   The left ventricular systolic function is normal.   LV end diastolic pressure is normal.   The left ventricular ejection fraction is 55-65% by visual estimate.   There is no mitral valve regurgitation. Conclusion Successful left heart cath right radial approach because of unstable angina Normal left ventricular function EF 55% mild left ventricular enlargement Coronaries Left main minor irregularities LAD large 80% proximal 70% mid Circumflex large moderate disease 50% mid circumflex diffuse RCA large 90% distal RCA prior to bifurcation of PL and PDA All vessels with TIMI-3 flow Right dominant system Intervention Successful PCI and stent proximal LAD and mid LAD with overlapping stents mid LAD 2.75 x 38 proximal LAD 3.0 x 18 Onyx [place] Lesions reduced from 80 to  0% and 70- 0% Recommend staged procedure for distal RCA to be done prior to discharge Patient was treated with Aggrastat for 12 hours Maintained on aspirin and Brilinta Patient tolerated procedure well No complications   ECHOCARDIOGRAM COMPLETE  Result Date: 06/24/2022    ECHOCARDIOGRAM REPORT   Patient Name:   TEVION LAFORGE [facility] Date of Exam: 06/24/2022 Medical Rec #:  [id number]             Height:       71.0  in Accession #:    [id number]            Weight:       310.0 lb Date of Birth:  05-18-1966             BSA:          2.540 m Patient Age:    13 years              BP:           128/83 mmHg Patient Gender: M                     HR:           81 bpm. Exam Location:  [facility] Procedure: 2D Echo, Color Doppler, Cardiac Doppler and Intracardiac            Opacification Agent Indications:     I21.4 NSTEMI  History:         Patient has no prior history of Echocardiogram examinations.                  Risk Factors:Hypertension, Diabetes and Dyslipidemia.  Sonographer:     Charmayne Sheer Referring Phys:  [id number] [place] TANG Diagnosing Phys: Yolonda Kida MD  Sonographer Comments: Technically difficult study due to poor echo windows. Image acquisition challenging due to patient body habitus. IMPRESSIONS  1. Left ventricular ejection fraction, by estimation, is 60 to 65%. The left ventricle has normal function. The left ventricle has no regional wall motion abnormalities. The left ventricular internal cavity size was moderately dilated. There is moderate  left ventricular hypertrophy. Left ventricular diastolic parameters are consistent with Grade I diastolic dysfunction (impaired relaxation).  2. Right ventricular systolic function is normal. The right ventricular size is normal.  3. Left atrial size was mildly dilated.  4. Right atrial size was mildly dilated.  5. The mitral valve is normal in structure. No evidence of mitral valve regurgitation.  6. The aortic valve is grossly normal. Aortic valve regurgitation is mild to moderate. FINDINGS  Left Ventricle: Left ventricular ejection fraction, by estimation, is 60 to 65%. The left ventricle has normal function. The left ventricle has no regional wall motion abnormalities. Definity contrast agent was given IV to delineate the left ventricular  endocardial borders. The left ventricular internal cavity size was moderately dilated. There is moderate left ventricular  hypertrophy. Left ventricular diastolic parameters are consistent with Grade I diastolic dysfunction (impaired relaxation). Right Ventricle: The right ventricular size is normal. No increase in right ventricular wall thickness. Right ventricular systolic function is normal. Left Atrium: Left atrial size was mildly dilated. Right Atrium: Right atrial size was mildly dilated. Pericardium: There is no evidence of pericardial effusion. Mitral Valve: The mitral valve is normal in structure. No evidence of mitral valve regurgitation. Tricuspid Valve: The tricuspid valve is normal in structure. Tricuspid valve regurgitation is trivial. Aortic Valve: The aortic valve is grossly normal. Aortic valve regurgitation is  mild to moderate. Aortic valve mean gradient measures 5.0 mmHg. Aortic valve peak gradient measures 9.5 mmHg. Aortic valve area, by VTI measures 3.38 cm. Pulmonic Valve: The pulmonic valve was normal in structure. Pulmonic valve regurgitation is not visualized. Aorta: The ascending aorta was not well visualized. IAS/Shunts: No atrial level shunt detected by color flow Doppler.  LEFT VENTRICLE PLAX 2D LVIDd:         5.20 cm   Diastology LVIDs:         3.40 cm   LV e' medial:    6.09 cm/s LV PW:         1.90 cm   LV E/e' medial:  13.0 LV IVS:        1.20 cm   LV e' lateral:   6.42 cm/s LVOT diam:     2.40 cm   LV E/e' lateral: 12.3 LV SV:         86 LV SV Index:   34 LVOT Area:     4.52 cm  LEFT ATRIUM           Index LA diam:      4.00 cm 1.57 cm/m LA Vol (A4C): 98.5 ml 38.78 ml/m  AORTIC VALVE                     PULMONIC VALVE AV Area (Vmax):    3.03 cm      PV Vmax:       0.99 m/s AV Area (Vmean):   2.99 cm      PV Vmean:      70.400 cm/s AV Area (VTI):     3.38 cm      PV VTI:        0.167 m AV Vmax:           154.00 cm/s   PV Peak grad:  3.9 mmHg AV Vmean:          110.000 cm/s  PV Mean grad:  2.0 mmHg AV VTI:            0.256 m AV Peak Grad:      9.5 mmHg AV Mean Grad:      5.0 mmHg LVOT Vmax:          103.00 cm/s LVOT Vmean:        72.600 cm/s LVOT VTI:          0.191 m LVOT/AV VTI ratio: 0.75  AORTA Ao Root diam: 4.00 cm MITRAL VALVE MV Area (PHT): 3.58 cm     SHUNTS MV Decel Time: 212 msec     Systemic VTI:  0.19 m MV E velocity: 79.10 cm/s   Systemic Diam: 2.40 cm MV A velocity: 129.00 cm/s MV E/A ratio:  0.61 Dwayne D Callwood MD Electronically signed by Yolonda Kida MD Signature Date/Time: 06/24/2022/4:06:35 PM    Final     Assessment & Plan:  .Type 2 diabetes mellitus with hyperlipidemia (HCC) Assessment & Plan: I have reviewed his diet ,  insulin regimen and CBG monitor reports from the last two weeks and advised to patient of the following changes: Increase Tresiba to 92 units daily.  Do not omit insulin doses because of skipped meals. Return in one week for review of BS using the new regimen of SSU as follows Use 15 units before a low carb or no carb breakfast ;   25 if breakfast is donuts or bagels  Use 15 units if skipping lunch,  25 if  eating lunch   25 units before dinner   40 if having dessert or eating bread/potatoes    History of non-ST elevation myocardial infarction (NSTEMI) Assessment & Plan: S/p quadruple stenting, with PTCA/ stent of LAD x 2 on Dec 12, followed by PTCA/stent of RCA on Dec 13. Continue Brilinta twice daily  for 90 days    Hospital discharge follow-up Assessment & Plan: Patient is stable post discharge and has no new issues or questions about discharge plans at the visit today for hospital follow up. All labs , imaging studies and progress notes from admission were reviewed with patient today      Other orders -     Isosorbide Mononitrate ER; Take 1 tablet (60 mg total) by mouth daily.  Dispense: 90 tablet; Refill: 1 -     Metoprolol Tartrate; Take 1 tablet (25 mg total) by mouth 2 (two) times daily.  Dispense: 180 tablet; Refill: 1 -     Rosuvastatin Calcium; Take 1 tablet (40 mg total) by mouth every evening.  Dispense: 90 tablet; Refill: 1 -      Ticagrelor; Take 1 tablet (90 mg total) by mouth 2 (two) times daily.  Dispense: 60 tablet; Refill: 1     I provided 30 minutes of face-to-face time during this encounter reviewing patient's last visit with me, patient's  most recent visit with cardiology,  nephrology,  and neurology,  recent surgical and non surgical procedures, previous  labs and imaging studies, counseling on currently addressed issues,  and post visit ordering to diagnostics and therapeutics .   Follow-up: Return in about 3 months (around 10/02/2022) for follow up diabetes.   [name], MD

## 2022-07-04 DIAGNOSIS — R079 Chest pain, unspecified: Secondary | ICD-10-CM | POA: Diagnosis not present

## 2022-07-05 DIAGNOSIS — E1165 Type 2 diabetes mellitus with hyperglycemia: Secondary | ICD-10-CM | POA: Diagnosis not present

## 2022-07-06 DIAGNOSIS — Z09 Encounter for follow-up examination after completed treatment for conditions other than malignant neoplasm: Secondary | ICD-10-CM | POA: Insufficient documentation

## 2022-07-06 NOTE — Assessment & Plan Note (Signed)
Patient is stable post discharge and has no new issues or questions about discharge plans at the visit today for hospital follow up. All labs , imaging studies and progress notes from admission were reviewed with patient today   

## 2022-07-06 NOTE — Assessment & Plan Note (Signed)
I have reviewed his diet ,  insulin regimen and CBG monitor reports from the last two weeks and advised to patient of the following changes: Increase Tresiba to 92 units daily.  Do not omit insulin doses because of skipped meals. Return in one week for review of BS using the new regimen of SSU as follows Use 15 units before a low carb or no carb breakfast ;   25 if breakfast is donuts or bagels  Use 15 units if skipping lunch,  25 if eating lunch   25 units before dinner   40 if having dessert or eating bread/potatoes

## 2022-07-06 NOTE — Assessment & Plan Note (Signed)
S/p quadruple stenting, with PTCA/ stent of LAD x 2 on Dec 12, followed by PTCA/stent of RCA on Dec 13. Continue Brilinta twice daily  for 90 days

## 2022-07-17 DIAGNOSIS — I152 Hypertension secondary to endocrine disorders: Secondary | ICD-10-CM | POA: Diagnosis not present

## 2022-07-17 DIAGNOSIS — E1169 Type 2 diabetes mellitus with other specified complication: Secondary | ICD-10-CM | POA: Diagnosis not present

## 2022-07-17 DIAGNOSIS — E669 Obesity, unspecified: Secondary | ICD-10-CM | POA: Diagnosis not present

## 2022-07-17 DIAGNOSIS — Z6841 Body Mass Index (BMI) 40.0 and over, adult: Secondary | ICD-10-CM | POA: Diagnosis not present

## 2022-07-17 DIAGNOSIS — E1159 Type 2 diabetes mellitus with other circulatory complications: Secondary | ICD-10-CM | POA: Diagnosis not present

## 2022-07-17 DIAGNOSIS — E785 Hyperlipidemia, unspecified: Secondary | ICD-10-CM | POA: Diagnosis not present

## 2022-07-17 DIAGNOSIS — I252 Old myocardial infarction: Secondary | ICD-10-CM | POA: Diagnosis not present

## 2022-07-25 ENCOUNTER — Telehealth: Payer: Self-pay | Admitting: Pharmacy Technician

## 2022-07-25 DIAGNOSIS — Z596 Low income: Secondary | ICD-10-CM

## 2022-07-25 NOTE — Progress Notes (Signed)
[place] [facility])                                            [place] Team    07/25/2022  Connor Ross July 20, 1965 [id number]  Care coordination call placed to [employer] in regard to Novolog, [place] and Ozempic application.  Spoke to Opal Sidles who informs patient is APPROVED 07/15/22-07/15/23. Medication will ship to the prescriber's office based on last fill date in 2023.  Galina Haddox P. Kyriaki Moder, [place]  [phone]

## 2022-07-29 ENCOUNTER — Telehealth: Payer: Self-pay

## 2022-07-29 DIAGNOSIS — E1159 Type 2 diabetes mellitus with other circulatory complications: Secondary | ICD-10-CM

## 2022-07-29 NOTE — Telephone Encounter (Signed)
Pt dropped off readings and libre sensor for download. I have placed them in your yellow folder folder for review.

## 2022-07-31 ENCOUNTER — Telehealth: Payer: Self-pay

## 2022-07-31 NOTE — Telephone Encounter (Signed)
Spoke with pt to let him know that we have received his patient assistance medications and they are ready for pick up.   Novolog: 12 boxes Ozempic: 4 boxes

## 2022-07-31 NOTE — Assessment & Plan Note (Signed)
HOLIDAY AND JANUARY CBGS REVIEWED.  WILL ADVISE TO INCREASE THE LUNCHTIME INSULIN TO 30 UNITS UNLESS LUNCH IS EITHER SKIPPED OR CONSISTS OF ONLY A SALAD WITH A PROTEIN , FOR THOSE SITUATIONS CONTINUE 25 UNITS IN THOSE SITUATIONS . ADD 5 UNITS TO DINNERTIME INSULIN DOSE UNLESS DINNER IS A SALAD AND STEAK

## 2022-08-01 ENCOUNTER — Encounter: Payer: Self-pay | Admitting: Internal Medicine

## 2022-08-01 MED ORDER — ROSUVASTATIN CALCIUM 40 MG PO TABS: 40.0000 mg | ORAL_TABLET | Freq: Every evening | ORAL | 3 refills | Status: DC

## 2022-08-01 NOTE — Telephone Encounter (Signed)
Pt has picked up medication.  

## 2022-08-01 NOTE — Telephone Encounter (Signed)
Pt is aware.  

## 2022-08-05 DIAGNOSIS — E1165 Type 2 diabetes mellitus with hyperglycemia: Secondary | ICD-10-CM | POA: Diagnosis not present

## 2022-08-14 LAB — HM DIABETES EYE EXAM

## 2022-09-05 DIAGNOSIS — E1165 Type 2 diabetes mellitus with hyperglycemia: Secondary | ICD-10-CM | POA: Diagnosis not present

## 2022-09-07 ENCOUNTER — Other Ambulatory Visit: Payer: Self-pay | Admitting: Internal Medicine

## 2022-09-09 MED ORDER — GABAPENTIN 100 MG PO CAPS: 200.0000 mg | ORAL_CAPSULE | Freq: Three times a day (TID) | ORAL | 0 refills | Status: DC

## 2022-09-11 ENCOUNTER — Telehealth: Payer: Self-pay | Admitting: *Deleted

## 2022-09-11 NOTE — Telephone Encounter (Signed)
Patient notified that Patient Assistance Medication are in the office & are ready for pick up.   Medication: Tresiba 200 unit pens & Novofine needles  Quantity: 6  Lot# [id number]  Exp: 11/11/2024

## 2022-09-12 ENCOUNTER — Telehealth: Payer: Self-pay | Admitting: Internal Medicine

## 2022-09-12 ENCOUNTER — Telehealth: Payer: Self-pay

## 2022-09-12 DIAGNOSIS — E785 Hyperlipidemia, unspecified: Secondary | ICD-10-CM

## 2022-09-12 DIAGNOSIS — I152 Hypertension secondary to endocrine disorders: Secondary | ICD-10-CM

## 2022-09-12 DIAGNOSIS — E1169 Type 2 diabetes mellitus with other specified complication: Secondary | ICD-10-CM

## 2022-09-12 NOTE — Telephone Encounter (Signed)
Labs are pended for your approval.

## 2022-09-12 NOTE — Telephone Encounter (Signed)
error 

## 2022-09-12 NOTE — Telephone Encounter (Signed)
Patient was in to pick up medication. A follow up was made and lab appointment. Please order labs, last labs were done at hospital on 06/27/2022.

## 2022-09-12 NOTE — Telephone Encounter (Signed)
Pt picked up patient assistance medication  on 09/12/22 @ 2:15 pm

## 2022-09-18 ENCOUNTER — Ambulatory Visit (INDEPENDENT_AMBULATORY_CARE_PROVIDER_SITE_OTHER): Payer: Medicare HMO | Admitting: Internal Medicine

## 2022-09-18 ENCOUNTER — Ambulatory Visit
Admission: RE | Admit: 2022-09-18 | Discharge: 2022-09-18 | Disposition: A | Discharge status: Home / Self Care | Payer: Medicare HMO | Source: Ambulatory Visit | Attending: Internal Medicine | Admitting: Internal Medicine

## 2022-09-18 ENCOUNTER — Ambulatory Visit
Admission: RE | Admit: 2022-09-18 | Discharge: 2022-09-18 | Disposition: A | Discharge status: Home / Self Care | Payer: Medicare HMO | Attending: Internal Medicine | Admitting: Internal Medicine

## 2022-09-18 ENCOUNTER — Encounter: Payer: Self-pay | Admitting: Internal Medicine

## 2022-09-18 VITALS — BP 130/62 | HR 92 | Temp 98.2°F | Ht 71.0 in | Wt 311.2 lb

## 2022-09-18 DIAGNOSIS — M5136 Other intervertebral disc degeneration, lumbar region: Secondary | ICD-10-CM | POA: Diagnosis not present

## 2022-09-18 DIAGNOSIS — M48061 Spinal stenosis, lumbar region without neurogenic claudication: Secondary | ICD-10-CM

## 2022-09-18 DIAGNOSIS — M545 Low back pain, unspecified: Secondary | ICD-10-CM

## 2022-09-18 DIAGNOSIS — M5416 Radiculopathy, lumbar region: Secondary | ICD-10-CM | POA: Diagnosis not present

## 2022-09-18 DIAGNOSIS — R109 Unspecified abdominal pain: Secondary | ICD-10-CM | POA: Diagnosis not present

## 2022-09-18 LAB — POCT URINALYSIS DIPSTICK
Bilirubin, UA: NEGATIVE
Glucose, UA: POSITIVE — AB
Ketones, UA: NEGATIVE
Nitrite, UA: NEGATIVE
Protein, UA: POSITIVE — AB
Spec Grav, UA: 1.01 (ref 1.010–1.025)
Urobilinogen, UA: 0.2 E.U./dL
pH, UA: 5.5 (ref 5.0–8.0)

## 2022-09-18 MED ORDER — PREDNISONE 10 MG PO TABS: ORAL_TABLET | ORAL | 0 refills | Status: DC

## 2022-09-18 MED ORDER — CYCLOBENZAPRINE HCL 10 MG PO TABS: 10.0000 mg | ORAL_TABLET | Freq: Three times a day (TID) | ORAL | 0 refills | Status: DC | PRN

## 2022-09-18 MED ORDER — TRAMADOL HCL 50 MG PO TABS: 100.0000 mg | ORAL_TABLET | Freq: Two times a day (BID) | ORAL | 2 refills | Status: DC | PRN

## 2022-09-18 NOTE — Progress Notes (Unsigned)
Subjective:  Patient ID: Connor Ross, male    DOB: 03-29-1966  Age: 57 y.o. MRN: [id number]  CC: There were no encounter diagnoses.   HPI WYETT FORCUCCI presents for  Chief Complaint  Patient presents with   Pain    Left side back pain   57 yr old male with chronic  disabling back pain  with history of prior surgery,  presents with severe left sided back pain since Friday.  On Day 1 the pain suddenly occurred  when he  leaned down to wash his legs in the shower,  also when he twisted  to the left,  quality was sharp and stabbing,  so severe it made him nauseated.  Localiezd to left CVA and radiates to top of buttock tthe pain  is low to moderate an d persistent for the last 5 days  and severe with any movement. [name] Has been taking tramadol and tylenol .  He takes plavix for history of MI .  No recent unusual activity or travel, sleeps on right side typically   Very remote history of calculi in his 20's   Has had chronic groin pain same side,  mild 6 months ago, gradually getting worse. [name] Has back pain bought on by trying to lift leg from sitting position.  Loses balance fruquently.  Leg gives way  infrequently  3 /yr with no fall.  Has not had PT in years.   Outpatient Medications Prior to Visit  Medication Sig Dispense Refill   ASPIRIN LOW DOSE 81 MG EC tablet TAKE 1 TABLET BY MOUTH  DAILY (SWALLOW WHOLE) 90 tablet 1   blood glucose meter kit and supplies Dispense based on patient and insurance preference. Use up to four times daily as directed. (FOR ICD-10 E10.9, E11.9). Use to check blood sugar three times daily 1 each 0   clobetasol cream (TEMOVATE) 0.05 % Apply topically 2 (two) times daily.     clopidogrel (PLAVIX) 75 MG tablet Take 75 mg by mouth daily.     Continuous Blood Gluc Sensor (FREESTYLE LIBRE 2 SENSOR) MISC Use to check sugar at least 4 times daily 1 each 2   fluticasone (FLONASE) 50 MCG/ACT nasal spray PLACE 2 SPRAYS INTO BOTH NOSTRILS DAILY. AT NIGHT  AFTER NASAL SALINE 16 g 2   furosemide (LASIX) 20 MG tablet TAKE 1 TABLET (20 MG TOTAL) BY MOUTH EVERY OTHER DAY. (Patient taking differently: Take 20 mg by mouth daily.) 45 tablet 3   gabapentin (NEURONTIN) 100 MG capsule Take 2 capsules (200 mg total) by mouth 3 (three) times daily. 540 capsule 0   insulin aspart (NOVOLOG FLEXPEN) 100 UNIT/ML FlexPen Inject up to 25 units with breakfast, 25 units with lunch, and 30 units with supper 72 mL 2   insulin degludec (TRESIBA FLEXTOUCH) 200 UNIT/ML FlexTouch Pen Inject 88 Units into the skin daily. (Patient taking differently: Inject 86 Units into the skin daily.) 48 mL 2   Insulin Pen Needle 32G X 6 MM MISC Use daily with insulin pen 50 each 0   JARDIANCE 25 MG TABS tablet TAKE 1 TABLET EVERY DAY (Patient taking differently: Take 25 mg by mouth daily.) 90 tablet 3   ketoconazole (NIZORAL) 2 % cream SMARTSIG:1 Application Topical 1 to 2 Times Daily     Lancets (FREESTYLE) lancets 1 each by Other route 2 (two) times daily. DX 250.02 100 each 12   losartan (COZAAR) 100 MG tablet TAKE 1 TABLET (100 MG TOTAL) DAILY. (Patient taking  differently: Take 100 mg by mouth daily.) 90 tablet 1   metoprolol tartrate (LOPRESSOR) 25 MG tablet Take 1 tablet (25 mg total) by mouth 2 (two) times daily. 180 tablet 1   ONETOUCH VERIO test strip USE TO CHECK BLOOD SUGAR 3  TIMES DAILY 300 strip 3   rosuvastatin (CRESTOR) 40 MG tablet Take 1 tablet (40 mg total) by mouth every evening for 360 doses. 90 tablet 3   Semaglutide, 2 MG/DOSE, (OZEMPIC, 2 MG/DOSE,) 8 MG/3ML SOPN Inject 2 mg into the skin once a week. 8 mL 2   tamsulosin (FLOMAX) 0.4 MG CAPS capsule TAKE 1 CAPSULE EVERY DAY (Patient taking differently: Take 0.4 mg by mouth daily.) 90 capsule 3   traMADol (ULTRAM) 50 MG tablet TAKE 1 TABLET EVERY 12 HOURS AS NEEDED (Patient taking differently: Take 50 mg by mouth every 12 (twelve) hours as needed for moderate pain.) 60 tablet 5   triamcinolone (KENALOG) 0.025 % cream  Apply topically.     isosorbide mononitrate (IMDUR) 60 MG 24 hr tablet Take 1 tablet (60 mg total) by mouth daily. 90 tablet 1   ticagrelor (BRILINTA) 90 MG TABS tablet Take 1 tablet (90 mg total) by mouth 2 (two) times daily. 60 tablet 1   No facility-administered medications prior to visit.    Review of Systems;  Patient denies headache, fevers, malaise, unintentional weight loss, skin rash, eye pain, sinus congestion and sinus pain, sore throat, dysphagia,  hemoptysis , cough, dyspnea, wheezing, chest pain, palpitations, orthopnea, edema, abdominal pain, nausea, melena, diarrhea, constipation, flank pain, dysuria, hematuria, urinary  Frequency, nocturia, numbness, tingling, seizures,  Focal weakness, Loss of consciousness,  Tremor, insomnia, depression, anxiety, and suicidal ideation.      Objective:  BP 130/62   Pulse 92   Temp 98.2 F (36.8 C) (Oral)   Ht '5\' 11"'$  (1.803 m)   Wt (!) 311 lb 3.2 oz (141.2 kg)   SpO2 97%   BMI 43.40 kg/m   BP Readings from Last 3 Encounters:  09/18/22 130/62  07/03/22 138/74  06/27/22 (!) 142/73    Wt Readings from Last 3 Encounters:  09/18/22 (!) 311 lb 3.2 oz (141.2 kg)  07/03/22 (!) 303 lb 12.8 oz (137.8 kg)  06/25/22 (!) 308 lb 10.3 oz (140 kg)    Physical Exam  Lab Results  Component Value Date   HGBA1C 8.0 (H) 03/27/2022   HGBA1C 7.6 (H) 12/21/2021   HGBA1C 8.2 (H) 09/07/2021    Lab Results  Component Value Date   CREATININE 0.63 06/27/2022   CREATININE 0.51 (L) 06/26/2022   CREATININE 0.64 06/25/2022    Lab Results  Component Value Date   WBC 11.8 (H) 06/27/2022   HGB 13.2 06/27/2022   HCT 40.4 06/27/2022   PLT 256 06/27/2022   GLUCOSE 173 (H) 06/27/2022   CHOL 182 06/25/2022   TRIG 340 (H) 06/25/2022   HDL 26 (L) 06/25/2022   LDLDIRECT 106.0 03/27/2022   LDLCALC 88 06/25/2022   ALT 28 03/27/2022   AST 12 03/27/2022   NA 137 06/27/2022   K 3.4 (L) 06/27/2022   CL 106 06/27/2022   CREATININE 0.63 06/27/2022    BUN 13 06/27/2022   CO2 24 06/27/2022   TSH 2.29 03/27/2022   PSA 0.47 03/27/2022   INR 1.0 06/24/2022   HGBA1C 8.0 (H) 03/27/2022   MICROALBUR 14.5 (H) 12/21/2021    CARDIAC CATHETERIZATION  Result Date: 06/26/2022   Prox LAD lesion is 80% stenosed.   Mid LAD  lesion is 70% stenosed.   Dist RCA lesion is 90% stenosed.   Prox Cx to Mid Cx lesion is 50% stenosed.   A stent was successfully placed.   A stent was successfully placed.   Post intervention, there is a 0% residual stenosis.   Post intervention, there is a 0% residual stenosis.   The left ventricular systolic function is normal.   LV end diastolic pressure is normal.   The left ventricular ejection fraction is 55-65% by visual estimate.   There is no mitral valve regurgitation. Conclusion Successful left heart cath right radial approach because of unstable angina Normal left ventricular function EF 55% mild left ventricular enlargement Coronaries Left main minor irregularities LAD large 80% proximal 70% mid Circumflex large moderate disease 50% mid circumflex diffuse RCA large 90% distal RCA prior to bifurcation of PL and PDA All vessels with TIMI-3 flow Right dominant system Intervention Successful PCI and stent proximal LAD and mid LAD with overlapping stents mid LAD 2.75 x 38 proximal LAD 3.0 x 18 Onyx frontiers Lesions reduced from 80 to 0% and 70- 0% Recommend staged procedure for distal RCA to be done prior to discharge Patient was treated with Aggrastat for 12 hours Maintained on aspirin and Brilinta Patient tolerated procedure well No complications   ECHOCARDIOGRAM COMPLETE  Result Date: 06/24/2022    ECHOCARDIOGRAM REPORT   Patient Name:   KAI FURUKAWA [facility] Date of Exam: 06/24/2022 Medical Rec #:  [id number]             Height:       71.0 in Accession #:    [id number]            Weight:       310.0 lb Date of Birth:  [birth year]             BSA:          2.540 m Patient Age:    16 years              BP:           128/83 mmHg  Patient Gender: M                     HR:           81 bpm. Exam Location:  [facility] Procedure: 2D Echo, Color Doppler, Cardiac Doppler and Intracardiac            Opacification Agent Indications:     I21.4 NSTEMI  History:         Patient has no prior history of Echocardiogram examinations.                  Risk Factors:Hypertension, Diabetes and Dyslipidemia.  Sonographer:     Charmayne Sheer Referring Phys:  [id number] [place] TANG Diagnosing Phys: Yolonda Kida MD  Sonographer Comments: Technically difficult study due to poor echo windows. Image acquisition challenging due to patient body habitus. IMPRESSIONS  1. Left ventricular ejection fraction, by estimation, is 60 to 65%. The left ventricle has normal function. The left ventricle has no regional wall motion abnormalities. The left ventricular internal cavity size was moderately dilated. There is moderate  left ventricular hypertrophy. Left ventricular diastolic parameters are consistent with Grade I diastolic dysfunction (impaired relaxation).  2. Right ventricular systolic function is normal. The right ventricular size is normal.  3. Left atrial size was mildly dilated.  4. Right atrial size was mildly dilated.  5. The  mitral valve is normal in structure. No evidence of mitral valve regurgitation.  6. The aortic valve is grossly normal. Aortic valve regurgitation is mild to moderate. FINDINGS  Left Ventricle: Left ventricular ejection fraction, by estimation, is 60 to 65%. The left ventricle has normal function. The left ventricle has no regional wall motion abnormalities. Definity contrast agent was given IV to delineate the left ventricular  endocardial borders. The left ventricular internal cavity size was moderately dilated. There is moderate left ventricular hypertrophy. Left ventricular diastolic parameters are consistent with Grade I diastolic dysfunction (impaired relaxation). Right Ventricle: The right ventricular size is normal. No increase in  right ventricular wall thickness. Right ventricular systolic function is normal. Left Atrium: Left atrial size was mildly dilated. Right Atrium: Right atrial size was mildly dilated. Pericardium: There is no evidence of pericardial effusion. Mitral Valve: The mitral valve is normal in structure. No evidence of mitral valve regurgitation. Tricuspid Valve: The tricuspid valve is normal in structure. Tricuspid valve regurgitation is trivial. Aortic Valve: The aortic valve is grossly normal. Aortic valve regurgitation is mild to moderate. Aortic valve mean gradient measures 5.0 mmHg. Aortic valve peak gradient measures 9.5 mmHg. Aortic valve area, by VTI measures 3.38 cm. Pulmonic Valve: The pulmonic valve was normal in structure. Pulmonic valve regurgitation is not visualized. Aorta: The ascending aorta was not well visualized. IAS/Shunts: No atrial level shunt detected by color flow Doppler.  LEFT VENTRICLE PLAX 2D LVIDd:         5.20 cm   Diastology LVIDs:         3.40 cm   LV e' medial:    6.09 cm/s LV PW:         1.90 cm   LV E/e' medial:  13.0 LV IVS:        1.20 cm   LV e' lateral:   6.42 cm/s LVOT diam:     2.40 cm   LV E/e' lateral: 12.3 LV SV:         86 LV SV Index:   34 LVOT Area:     4.52 cm  LEFT ATRIUM           Index LA diam:      4.00 cm 1.57 cm/m LA Vol (A4C): 98.5 ml 38.78 ml/m  AORTIC VALVE                     PULMONIC VALVE AV Area (Vmax):    3.03 cm      PV Vmax:       0.99 m/s AV Area (Vmean):   2.99 cm      PV Vmean:      70.400 cm/s AV Area (VTI):     3.38 cm      PV VTI:        0.167 m AV Vmax:           154.00 cm/s   PV Peak grad:  3.9 mmHg AV Vmean:          110.000 cm/s  PV Mean grad:  2.0 mmHg AV VTI:            0.256 m AV Peak Grad:      9.5 mmHg AV Mean Grad:      5.0 mmHg LVOT Vmax:         103.00 cm/s LVOT Vmean:        72.600 cm/s LVOT VTI:          0.191 m  LVOT/AV VTI ratio: 0.75  AORTA Ao Root diam: 4.00 cm MITRAL VALVE MV Area (PHT): 3.58 cm     SHUNTS MV Decel Time: 212  msec     Systemic VTI:  0.19 m MV E velocity: 79.10 cm/s   Systemic Diam: 2.40 cm MV A velocity: 129.00 cm/s MV E/A ratio:  0.61 Dwayne D Callwood MD Electronically signed by Yolonda Kida MD Signature Date/Time: 06/24/2022/4:06:35 PM    Final     Assessment & Plan:  .There are no diagnoses linked to this encounter.   I provided 30 minutes of face-to-face time during this encounter reviewing patient's last visit with me, patient's  most recent visit with cardiology,  nephrology,  and neurology,  recent surgical and non surgical procedures, previous  labs and imaging studies, counseling on currently addressed issues,  and post visit ordering to diagnostics and therapeutics .   Follow-up: No follow-ups on file.   [name], MD

## 2022-09-18 NOTE — Patient Instructions (Addendum)
Increase tramadol to 2 tablets every 12 hours for the next few days  Prednisone taper start it tomorrow  Flexeril every 8 hours as needed   PT referral recommended if no improvement in one week

## 2022-09-19 ENCOUNTER — Other Ambulatory Visit: Payer: Self-pay | Admitting: Internal Medicine

## 2022-09-19 DIAGNOSIS — I152 Hypertension secondary to endocrine disorders: Secondary | ICD-10-CM

## 2022-09-19 LAB — URINALYSIS, MICROSCOPIC ONLY

## 2022-09-19 LAB — URINE CULTURE
MICRO NUMBER:: 14657065
SPECIMEN QUALITY:: ADEQUATE

## 2022-09-19 NOTE — Assessment & Plan Note (Addendum)
Acute on chronic pain.  Currently pain suggestive of muscle spasm involving lower paraspinus muscles and gluteus minimums.  Plain  xrays  ordered to rule out vertebral fracture .  Prednisone tape, muscle relaxer, hat/ice,  and increase use of tramadol to 4 daily prn.

## 2022-09-21 ENCOUNTER — Other Ambulatory Visit: Payer: Self-pay | Admitting: Internal Medicine

## 2022-09-21 MED ORDER — AMOXICILLIN-POT CLAVULANATE 875-125 MG PO TABS: 1.0000 | ORAL_TABLET | Freq: Two times a day (BID) | ORAL | 0 refills | Status: DC

## 2022-09-21 NOTE — Assessment & Plan Note (Signed)
Adding empiric treatment for group A strep  isolated from urine and concurrent pyuria with augmentin for ten days .  Probiotic advised

## 2022-09-27 ENCOUNTER — Other Ambulatory Visit (INDEPENDENT_AMBULATORY_CARE_PROVIDER_SITE_OTHER): Payer: Medicare HMO

## 2022-09-27 DIAGNOSIS — Z79899 Other long term (current) drug therapy: Secondary | ICD-10-CM | POA: Diagnosis not present

## 2022-09-27 DIAGNOSIS — E1169 Type 2 diabetes mellitus with other specified complication: Secondary | ICD-10-CM | POA: Diagnosis not present

## 2022-09-27 DIAGNOSIS — I152 Hypertension secondary to endocrine disorders: Secondary | ICD-10-CM

## 2022-09-27 DIAGNOSIS — L209 Atopic dermatitis, unspecified: Secondary | ICD-10-CM

## 2022-09-27 DIAGNOSIS — E1159 Type 2 diabetes mellitus with other circulatory complications: Secondary | ICD-10-CM

## 2022-09-27 DIAGNOSIS — E669 Obesity, unspecified: Secondary | ICD-10-CM

## 2022-09-27 DIAGNOSIS — E785 Hyperlipidemia, unspecified: Secondary | ICD-10-CM

## 2022-09-27 LAB — CBC WITH DIFFERENTIAL/PLATELET
Basophils Absolute: 0.1 10*3/uL (ref 0.0–0.1)
Basophils Relative: 0.8 % (ref 0.0–3.0)
Eosinophils Absolute: 0.3 10*3/uL (ref 0.0–0.7)
Eosinophils Relative: 3.2 % (ref 0.0–5.0)
HCT: 44.1 % (ref 39.0–52.0)
Hemoglobin: 14.9 g/dL (ref 13.0–17.0)
Lymphocytes Relative: 21.9 % (ref 12.0–46.0)
Lymphs Abs: 1.9 10*3/uL (ref 0.7–4.0)
MCHC: 33.9 g/dL (ref 30.0–36.0)
MCV: 91.2 fl (ref 78.0–100.0)
Monocytes Absolute: 0.7 10*3/uL (ref 0.1–1.0)
Monocytes Relative: 8.5 % (ref 3.0–12.0)
Neutro Abs: 5.8 10*3/uL (ref 1.4–7.7)
Neutrophils Relative %: 65.6 % (ref 43.0–77.0)
Platelets: 257 10*3/uL (ref 150.0–400.0)
RBC: 4.83 Mil/uL (ref 4.22–5.81)
RDW: 14.3 % (ref 11.5–15.5)
WBC: 8.8 10*3/uL (ref 4.0–10.5)

## 2022-09-27 LAB — COMPREHENSIVE METABOLIC PANEL
ALT: 23 U/L (ref 0–53)
AST: 10 U/L (ref 0–37)
Albumin: 3.8 g/dL (ref 3.5–5.2)
Alkaline Phosphatase: 61 U/L (ref 39–117)
BUN: 19 mg/dL (ref 6–23)
CO2: 23 mEq/L (ref 19–32)
Calcium: 8.6 mg/dL (ref 8.4–10.5)
Chloride: 105 mEq/L (ref 96–112)
Creatinine, Ser: 0.66 mg/dL (ref 0.40–1.50)
GFR: 103.35 mL/min (ref >60.00)
Glucose, Bld: 133 mg/dL — ABNORMAL HIGH (ref 70–99)
Potassium: 3.9 mEq/L (ref 3.5–5.1)
Sodium: 137 mEq/L (ref 135–145)
Total Bilirubin: 0.3 mg/dL (ref 0.2–1.2)
Total Protein: 6.5 g/dL (ref 6.0–8.3)

## 2022-09-27 LAB — LIPID PANEL
Cholesterol: 121 mg/dL (ref 0–200)
HDL: 25 mg/dL — ABNORMAL LOW (ref >39.00)
NonHDL: 95.56
Total CHOL/HDL Ratio: 5
Triglycerides: 330 mg/dL — ABNORMAL HIGH (ref 0.0–149.0)
VLDL: 66 mg/dL — ABNORMAL HIGH (ref 0.0–40.0)

## 2022-09-27 LAB — HEMOGLOBIN A1C: Hgb A1c MFr Bld: 8.3 % — ABNORMAL HIGH (ref 4.6–6.5)

## 2022-09-27 LAB — LDL CHOLESTEROL, DIRECT: Direct LDL: 54 mg/dL

## 2022-09-27 LAB — VITAMIN D 25 HYDROXY (VIT D DEFICIENCY, FRACTURES): VITD: 22.99 ng/mL — ABNORMAL LOW (ref 30.00–100.00)

## 2022-09-30 LAB — QUANTIFERON-TB GOLD PLUS
Mitogen-NIL: >10 IU/mL
NIL: 0.03 IU/mL
QuantiFERON-TB Gold Plus: NEGATIVE
TB1-NIL: <0 IU/mL
TB2-NIL: 0 IU/mL

## 2022-10-01 ENCOUNTER — Encounter: Payer: Self-pay | Admitting: Internal Medicine

## 2022-10-01 ENCOUNTER — Ambulatory Visit (INDEPENDENT_AMBULATORY_CARE_PROVIDER_SITE_OTHER): Payer: Medicare HMO | Admitting: Internal Medicine

## 2022-10-01 VITALS — BP 122/80 | HR 76 | Temp 97.7°F | Ht 71.0 in | Wt 310.0 lb

## 2022-10-01 DIAGNOSIS — M48061 Spinal stenosis, lumbar region without neurogenic claudication: Secondary | ICD-10-CM | POA: Diagnosis not present

## 2022-10-01 DIAGNOSIS — E785 Hyperlipidemia, unspecified: Secondary | ICD-10-CM | POA: Diagnosis not present

## 2022-10-01 DIAGNOSIS — E1159 Type 2 diabetes mellitus with other circulatory complications: Secondary | ICD-10-CM | POA: Diagnosis not present

## 2022-10-01 DIAGNOSIS — I152 Hypertension secondary to endocrine disorders: Secondary | ICD-10-CM

## 2022-10-01 DIAGNOSIS — M5416 Radiculopathy, lumbar region: Secondary | ICD-10-CM | POA: Diagnosis not present

## 2022-10-01 DIAGNOSIS — M25552 Pain in left hip: Secondary | ICD-10-CM | POA: Diagnosis not present

## 2022-10-01 DIAGNOSIS — E1169 Type 2 diabetes mellitus with other specified complication: Secondary | ICD-10-CM | POA: Diagnosis not present

## 2022-10-01 DIAGNOSIS — E669 Obesity, unspecified: Secondary | ICD-10-CM | POA: Diagnosis not present

## 2022-10-01 MED ORDER — TRAMADOL HCL 50 MG PO TABS: 100.0000 mg | ORAL_TABLET | Freq: Two times a day (BID) | ORAL | 2 refills | Status: DC | PRN

## 2022-10-01 MED ORDER — METOPROLOL TARTRATE 25 MG PO TABS: 25.0000 mg | ORAL_TABLET | Freq: Two times a day (BID) | ORAL | 1 refills | Status: DC

## 2022-10-01 MED ORDER — FUROSEMIDE 20 MG PO TABS: 20.0000 mg | ORAL_TABLET | ORAL | 3 refills | Status: DC

## 2022-10-01 MED ORDER — GABAPENTIN 100 MG PO CAPS: 200.0000 mg | ORAL_CAPSULE | Freq: Three times a day (TID) | ORAL | 0 refills | Status: DC

## 2022-10-01 NOTE — Patient Instructions (Addendum)
Change the novolog  mealtime insulin:     For morning if you skip breakfast and coffee:  12 units (not 15) if  CBG IS 150 or higher   "  ":          8 units (NOT 10) IF CBG IS 100 TO 149    For your low Vitamin D:  Take 5000 IUs   of Vitd D3 daily for for one month,  then resume 1000 Ius  daily   For your left hip problem: Consider getting EMG /Nerve conduction studies to determine if left leg weakness is due to spinal stenosis   I have ordered ,  hip x ray  you can go in and get it  anytime at [facility] outpatient imaging on [address]

## 2022-10-01 NOTE — Progress Notes (Unsigned)
Subjective:  Patient ID: Connor Ross, male    DOB: 06/26/1966  Age: 57 y.o. MRN: [id number]  CC: There were no encounter diagnoses.   HPI Connor Ross presents for  Chief Complaint  Patient presents with   Medical Management of Chronic Issues    diabetes   1) T2DM:  reviewed last regimen change in December after reviewing vBG monitor reports from the  previous  two weeks :  Increase Tresiba to 92 units daily.  Do not omit insulin doses because of skipped meals. Return in one week for review of BS using the new regimen of SSU as follows Use 15 units before a low carb or no carb breakfast ;   25 if breakfast is donuts or bagels  Use 15 units if skipping lunch,  25 if eating lunch   25 units before dinner   40 if having dessert or eating bread/potatoes  Pre steroid readings from March 6-8 notes lows in the morning.    Current CBG monitor data downloaded and reviewed ;   Patient's  sugars have been  IN RANGE     % OF THE TIME,   BELOW RANGE    % of the time.  And ABOVE RANGE   % OF THE TIME .  Medication changes were made based on this review as follows:   He receives meds through [employer] assistance program  in regard to [place], [place] and Ozempic   2) stabbing back pain : seen march 6 and sent for CXR : Minimal scoliotic curvature. 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  Mild sacroiliac osteoarthritis.  Started steroids on March 7 -12th  . Pain is improved but not resolved, and he continues to have diffictuly flexing the left  leg at the hip     Outpatient Medications Prior to Visit  Medication Sig Dispense Refill   amoxicillin-clavulanate (AUGMENTIN) 875-125 MG tablet Take 1 tablet by mouth 2 (two) times daily. 20 tablet 0   ASPIRIN LOW DOSE 81 MG EC tablet TAKE 1 TABLET BY MOUTH  DAILY (SWALLOW WHOLE) 90 tablet 1   blood glucose meter kit and supplies Dispense based on patient and insurance preference. Use up to four times  daily as directed. (FOR ICD-10 E10.9, E11.9). Use to check blood sugar three times daily 1 each 0   clobetasol cream (TEMOVATE) 0.05 % Apply topically 2 (two) times daily.     clopidogrel (PLAVIX) 75 MG tablet Take 75 mg by mouth daily.     Continuous Blood Gluc Sensor (FREESTYLE LIBRE 2 SENSOR) MISC Use to check sugar at least 4 times daily 1 each 2   cyclobenzaprine (FLEXERIL) 10 MG tablet Take 1 tablet (10 mg total) by mouth 3 (three) times daily as needed for muscle spasms. 30 tablet 0   fluticasone (FLONASE) 50 MCG/ACT nasal spray PLACE 2 SPRAYS INTO BOTH NOSTRILS DAILY. AT NIGHT AFTER NASAL SALINE 16 g 2   furosemide (LASIX) 20 MG tablet TAKE 1 TABLET (20 MG TOTAL) BY MOUTH EVERY OTHER DAY. (Patient taking differently: Take 20 mg by mouth daily.) 45 tablet 3   gabapentin (NEURONTIN) 100 MG capsule Take 2 capsules (200 mg total) by mouth 3 (three) times daily. 540 capsule 0   insulin aspart (NOVOLOG FLEXPEN) 100 UNIT/ML FlexPen Inject up to 25 units with breakfast, 25 units with lunch, and 30 units with supper 72 mL 2   insulin degludec (TRESIBA FLEXTOUCH) 200 UNIT/ML FlexTouch Pen Inject 88 Units into  the skin daily. (Patient taking differently: Inject 86 Units into the skin daily.) 48 mL 2   Insulin Pen Needle 32G X 6 MM MISC Use daily with insulin pen 50 each 0   JARDIANCE 25 MG TABS tablet TAKE 1 TABLET EVERY DAY (Patient taking differently: Take 25 mg by mouth daily.) 90 tablet 3   ketoconazole (NIZORAL) 2 % cream SMARTSIG:1 Application Topical 1 to 2 Times Daily     Lancets (FREESTYLE) lancets 1 each by Other route 2 (two) times daily. DX 250.02 100 each 12   losartan (COZAAR) 100 MG tablet TAKE 1 TABLET EVERY DAY 90 tablet 3   metoprolol tartrate (LOPRESSOR) 25 MG tablet Take 1 tablet (25 mg total) by mouth 2 (two) times daily. 180 tablet 1   ONETOUCH VERIO test strip USE TO CHECK BLOOD SUGAR 3  TIMES DAILY 300 strip 3   rosuvastatin (CRESTOR) 40 MG tablet Take 1 tablet (40 mg total) by  mouth every evening for 360 doses. 90 tablet 3   Semaglutide, 2 MG/DOSE, (OZEMPIC, 2 MG/DOSE,) 8 MG/3ML SOPN Inject 2 mg into the skin once a week. 8 mL 2   tamsulosin (FLOMAX) 0.4 MG CAPS capsule TAKE 1 CAPSULE EVERY DAY (Patient taking differently: Take 0.4 mg by mouth daily.) 90 capsule 3   traMADol (ULTRAM) 50 MG tablet Take 2 tablets (100 mg total) by mouth every 12 (twelve) hours as needed. 120 tablet 2   triamcinolone (KENALOG) 0.025 % cream Apply topically.     predniSONE (DELTASONE) 10 MG tablet 6 tablets on Day 1 , then reduce by 1 tablet daily until gone 21 tablet 0   No facility-administered medications prior to visit.    Review of Systems;  Patient denies headache, fevers, malaise, unintentional weight loss, skin rash, eye pain, sinus congestion and sinus pain, sore throat, dysphagia,  hemoptysis , cough, dyspnea, wheezing, chest pain, palpitations, orthopnea, edema, abdominal pain, nausea, melena, diarrhea, constipation, flank pain, dysuria, hematuria, urinary  Frequency, nocturia, numbness, tingling, seizures,  Focal weakness, Loss of consciousness,  Tremor, insomnia, depression, anxiety, and suicidal ideation.      Objective:  BP 122/80   Pulse 76   Temp 97.7 F (36.5 C) (Oral)   Ht 5\' 11"  (1.803 m)   Wt (!) 310 lb (140.6 kg)   SpO2 98%   BMI 43.24 kg/m   BP Readings from Last 3 Encounters:  10/01/22 122/80  09/18/22 130/62  07/03/22 138/74    Wt Readings from Last 3 Encounters:  10/01/22 (!) 310 lb (140.6 kg)  09/18/22 (!) 311 lb 3.2 oz (141.2 kg)  07/03/22 (!) 303 lb 12.8 oz (137.8 kg)    Physical Exam  Lab Results  Component Value Date   HGBA1C 8.3 (H) 09/27/2022   HGBA1C 8.0 (H) 03/27/2022   HGBA1C 7.6 (H) 12/21/2021    Lab Results  Component Value Date   CREATININE 0.66 09/27/2022   CREATININE 0.63 06/27/2022   CREATININE 0.51 (L) 06/26/2022    Lab Results  Component Value Date   WBC 8.8 09/27/2022   HGB 14.9 09/27/2022   HCT 44.1  09/27/2022   PLT 257.0 09/27/2022   GLUCOSE 133 (H) 09/27/2022   CHOL 121 09/27/2022   TRIG 330.0 (H) 09/27/2022   HDL 25.00 (L) 09/27/2022   LDLDIRECT 54.0 09/27/2022   LDLCALC 88 06/25/2022   ALT 23 09/27/2022   AST 10 09/27/2022   NA 137 09/27/2022   K 3.9 09/27/2022   CL 105 09/27/2022   CREATININE  0.66 09/27/2022   BUN 19 09/27/2022   CO2 23 09/27/2022   TSH 2.29 03/27/2022   PSA 0.47 03/27/2022   INR 1.0 06/24/2022   HGBA1C 8.3 (H) 09/27/2022   MICROALBUR 14.5 (H) 12/21/2021    DG Lumbar Spine Complete  Result Date: 09/21/2022 CLINICAL DATA:  Stabbing left-sided back pain EXAM: LUMBAR SPINE - COMPLETE 4+ VIEW COMPARISON:  01/16/2013 FINDINGS: Minimal scoliotic curvature. Disc space narrowing throughout the lower lumbar region. Ordinary lower lumbar facet osteoarthritis. Mild sacroiliac osteoarthritis. No fracture or focal lesion. IMPRESSION: No acute or traumatic finding. Minimal scoliotic curvature. 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 Mild sacroiliac osteoarthritis. Electronically Signed   By: Nelson Chimes M.D.   On: 09/21/2022 13:32   DG Abd 2 Views  Result Date: 09/19/2022 CLINICAL DATA:  Left CVA stabbing pain.  Evaluate for stones. EXAM: ABDOMEN - 2 VIEW COMPARISON:  January 05, 2020 KUB FINDINGS: Lung bases are normal. No free air, portal venous gas, or pneumatosis. Both kidneys are partially obscured by bowel contents but no renal stones are noted. Calcifications in the pelvis are likely phleboliths. Moderate fecal loading throughout the colon.  No bowel obstruction. Degenerative changes throughout the lumbar spine and in the hips. IMPRESSION: 1. No renal stones identified. 2. Moderate fecal loading throughout the colon. 3. Degenerative changes in the spine and hips. Electronically Signed   By: Dorise Bullion III M.D.   On: 09/19/2022 11:03    Assessment & Plan:  .There are no diagnoses linked to this encounter.   I provided 30 minutes  of face-to-face time during this encounter reviewing patient's last visit with me, patient's  most recent visit with cardiology,  nephrology,  and neurology,  recent surgical and non surgical procedures, previous  labs and imaging studies, counseling on currently addressed issues,  and post visit ordering to diagnostics and therapeutics .   Follow-up: No follow-ups on file.   [name], MD

## 2022-10-01 NOTE — Assessment & Plan Note (Signed)
With left hip flexor weakness  on exam today.  No prior surgery

## 2022-10-02 LAB — FRUCTOSAMINE: Fructosamine: 248 umol/L (ref 205–285)

## 2022-10-02 LAB — HEPATITIS PANEL, ACUTE
Hep A IgM: NONREACTIVE
Hep B C IgM: NONREACTIVE
Hepatitis B Surface Ag: NONREACTIVE
Hepatitis C Ab: NONREACTIVE

## 2022-10-03 ENCOUNTER — Encounter: Payer: Self-pay | Admitting: Internal Medicine

## 2022-10-03 NOTE — Assessment & Plan Note (Addendum)
Fructosamine level of 248 correasponds an A1c of 5.8

## 2022-10-03 NOTE — Assessment & Plan Note (Addendum)
Managed with  92 units of Tresiba as basal insulin,  Novolog SSI at meals,  Ozempic 2 mg weekly,  and Jardiance 25 mg daily.  Advised to increase the morning prandial insulin to 12 units if having coffee/cream without breakfast and 8 units if no coffee or breakfast. He receives medication assistance through [employer]  in regard to [place], [place] and [employer]

## 2022-10-06 DIAGNOSIS — E1165 Type 2 diabetes mellitus with hyperglycemia: Secondary | ICD-10-CM | POA: Diagnosis not present

## 2022-10-14 ENCOUNTER — Other Ambulatory Visit: Payer: Self-pay | Admitting: Internal Medicine

## 2022-10-14 DIAGNOSIS — M48061 Spinal stenosis, lumbar region without neurogenic claudication: Secondary | ICD-10-CM

## 2022-10-23 ENCOUNTER — Other Ambulatory Visit: Payer: Self-pay | Admitting: Internal Medicine

## 2022-10-23 DIAGNOSIS — I152 Hypertension secondary to endocrine disorders: Secondary | ICD-10-CM

## 2022-11-02 ENCOUNTER — Encounter: Payer: Self-pay | Admitting: Internal Medicine

## 2022-11-02 ENCOUNTER — Other Ambulatory Visit: Payer: Self-pay | Admitting: Internal Medicine

## 2022-11-02 DIAGNOSIS — M48061 Spinal stenosis, lumbar region without neurogenic claudication: Secondary | ICD-10-CM

## 2022-11-03 NOTE — Telephone Encounter (Signed)
Lov: 10/01/2022   Nov; 12/2022

## 2022-11-03 NOTE — Telephone Encounter (Signed)
Tramadol refill requested from [facility] mail order denied.  Patient was given a new rx during march OV for #120/month with 2refills

## 2022-11-05 MED ORDER — TRAMADOL HCL 50 MG PO TABS: 50.0000 mg | ORAL_TABLET | Freq: Two times a day (BID) | ORAL | 1 refills | Status: DC | PRN

## 2022-11-05 NOTE — Telephone Encounter (Signed)
Pt is aware and would like to have his Tramadol sent to his mail order.

## 2022-11-06 DIAGNOSIS — E1165 Type 2 diabetes mellitus with hyperglycemia: Secondary | ICD-10-CM | POA: Diagnosis not present

## 2022-11-13 ENCOUNTER — Telehealth: Payer: Self-pay

## 2022-11-13 NOTE — Telephone Encounter (Signed)
Spoke with pt to let him know that we have received his patient assistance medication and it is ready for pick up.  Ozempic: 4 boxes Novolog: 11 boxes

## 2022-11-14 NOTE — Telephone Encounter (Signed)
Pt picked up medication.

## 2022-11-16 ENCOUNTER — Other Ambulatory Visit: Payer: Self-pay | Admitting: Internal Medicine

## 2022-11-29 DIAGNOSIS — M4306 Spondylolysis, lumbar region: Secondary | ICD-10-CM | POA: Diagnosis not present

## 2022-11-29 DIAGNOSIS — M1612 Unilateral primary osteoarthritis, left hip: Secondary | ICD-10-CM | POA: Diagnosis not present

## 2022-11-29 DIAGNOSIS — M5416 Radiculopathy, lumbar region: Secondary | ICD-10-CM | POA: Diagnosis not present

## 2022-12-07 DIAGNOSIS — E1165 Type 2 diabetes mellitus with hyperglycemia: Secondary | ICD-10-CM | POA: Diagnosis not present

## 2022-12-18 ENCOUNTER — Telehealth: Payer: Self-pay

## 2022-12-18 NOTE — Telephone Encounter (Signed)
Spoke with pt to let him know that we have received his pt assistance medication and it is ready for pick up. Pt gave a verbal understanding.   Connor Ross: 6 boxes Pen needles: 5 boxes

## 2022-12-19 NOTE — Telephone Encounter (Signed)
Pt picked up pt assistance medications today

## 2022-12-27 ENCOUNTER — Other Ambulatory Visit (INDEPENDENT_AMBULATORY_CARE_PROVIDER_SITE_OTHER): Payer: Medicare HMO

## 2022-12-27 DIAGNOSIS — I152 Hypertension secondary to endocrine disorders: Secondary | ICD-10-CM | POA: Diagnosis not present

## 2022-12-27 DIAGNOSIS — E1169 Type 2 diabetes mellitus with other specified complication: Secondary | ICD-10-CM

## 2022-12-27 DIAGNOSIS — E785 Hyperlipidemia, unspecified: Secondary | ICD-10-CM

## 2022-12-27 DIAGNOSIS — E669 Obesity, unspecified: Secondary | ICD-10-CM

## 2022-12-27 DIAGNOSIS — E1159 Type 2 diabetes mellitus with other circulatory complications: Secondary | ICD-10-CM | POA: Diagnosis not present

## 2022-12-27 LAB — HEMOGLOBIN A1C: Hgb A1c MFr Bld: 7.6 % — ABNORMAL HIGH (ref 4.6–6.5)

## 2022-12-27 LAB — COMPREHENSIVE METABOLIC PANEL
ALT: 23 U/L (ref 0–53)
AST: 15 U/L (ref 0–37)
Albumin: 4.6 g/dL (ref 3.5–5.2)
Alkaline Phosphatase: 55 U/L (ref 39–117)
BUN: 14 mg/dL (ref 6–23)
CO2: 29 mEq/L (ref 19–32)
Calcium: 9.2 mg/dL (ref 8.4–10.5)
Chloride: 100 mEq/L (ref 96–112)
Creatinine, Ser: 0.77 mg/dL (ref 0.40–1.50)
GFR: 98.48 mL/min (ref >60.00)
Glucose, Bld: 78 mg/dL (ref 70–99)
Potassium: 3.9 mEq/L (ref 3.5–5.1)
Sodium: 138 mEq/L (ref 135–145)
Total Bilirubin: 0.4 mg/dL (ref 0.2–1.2)
Total Protein: 7.7 g/dL (ref 6.0–8.3)

## 2022-12-27 LAB — LIPID PANEL
Cholesterol: 137 mg/dL (ref 0–200)
HDL: 34.4 mg/dL — ABNORMAL LOW (ref >39.00)
LDL Cholesterol: 73 mg/dL (ref 0–99)
NonHDL: 102.7
Total CHOL/HDL Ratio: 4
Triglycerides: 147 mg/dL (ref 0.0–149.0)
VLDL: 29.4 mg/dL (ref 0.0–40.0)

## 2022-12-27 LAB — MICROALBUMIN / CREATININE URINE RATIO
Creatinine,U: 21.7 mg/dL
Microalb Creat Ratio: 13.6 mg/g (ref 0.0–30.0)
Microalb, Ur: 3 mg/dL — ABNORMAL HIGH (ref 0.0–1.9)

## 2022-12-27 LAB — LDL CHOLESTEROL, DIRECT: Direct LDL: 76 mg/dL

## 2022-12-31 ENCOUNTER — Ambulatory Visit (INDEPENDENT_AMBULATORY_CARE_PROVIDER_SITE_OTHER): Payer: Medicare HMO | Admitting: Internal Medicine

## 2022-12-31 ENCOUNTER — Telehealth: Payer: Self-pay

## 2022-12-31 ENCOUNTER — Encounter: Payer: Self-pay | Admitting: Internal Medicine

## 2022-12-31 VITALS — BP 134/68 | HR 82 | Temp 98.0°F | Ht 71.0 in | Wt 315.0 lb

## 2022-12-31 DIAGNOSIS — Z7985 Long-term (current) use of injectable non-insulin antidiabetic drugs: Secondary | ICD-10-CM | POA: Diagnosis not present

## 2022-12-31 DIAGNOSIS — M5416 Radiculopathy, lumbar region: Secondary | ICD-10-CM | POA: Diagnosis not present

## 2022-12-31 DIAGNOSIS — I152 Hypertension secondary to endocrine disorders: Secondary | ICD-10-CM | POA: Diagnosis not present

## 2022-12-31 DIAGNOSIS — G4733 Obstructive sleep apnea (adult) (pediatric): Secondary | ICD-10-CM

## 2022-12-31 DIAGNOSIS — E1159 Type 2 diabetes mellitus with other circulatory complications: Secondary | ICD-10-CM

## 2022-12-31 DIAGNOSIS — Z794 Long term (current) use of insulin: Secondary | ICD-10-CM | POA: Diagnosis not present

## 2022-12-31 DIAGNOSIS — M48061 Spinal stenosis, lumbar region without neurogenic claudication: Secondary | ICD-10-CM

## 2022-12-31 DIAGNOSIS — Z6841 Body Mass Index (BMI) 40.0 and over, adult: Secondary | ICD-10-CM

## 2022-12-31 DIAGNOSIS — E669 Obesity, unspecified: Secondary | ICD-10-CM

## 2022-12-31 DIAGNOSIS — E1169 Type 2 diabetes mellitus with other specified complication: Secondary | ICD-10-CM

## 2022-12-31 MED ORDER — TRAMADOL HCL 50 MG PO TABS: 50.0000 mg | ORAL_TABLET | Freq: Four times a day (QID) | ORAL | 1 refills | Status: DC | PRN

## 2022-12-31 NOTE — Progress Notes (Signed)
Subjective:  Patient ID: Connor Ross, male    DOB: 12/11/1965  Age: 57 y.o. MRN: [id number]  CC: The primary encounter diagnosis was Encounter for long-term (current) use of insulin (HCC). Diagnoses of Long-term current use of injectable noninsulin antidiabetic medication, Spinal stenosis of lumbar region with radiculopathy, OSA on CPAP, Hypertension associated with diabetes (HCC), and Obesity, diabetes, and hypertension syndrome (HCC) were also pertinent to this visit.   HPI Connor Ross presents for  Chief Complaint  Patient presents with   Medical Management of Chronic Issues   1) obesity, diabetes and hypertension:  He feels generally well, but has been unable to resume walking for exercise  due to persistent left hip pain.  Using the cbg monitor and checking blood sugars once daily at variable times.  .I have downloaded and reviewed the data from patient's continuous blood glucose monitor. Patient's  sugars have been  IN RANGE  76  % OF THE TIME,   BELOW RANGE  0  % of the time.  And ABOVE RANGE  24 % OF THE TIME .  He is taking his medications as directed.: Tresiba 86 units,  novolog 25 to 30 units tid,  jardiance 25 mg daily and ozempic . 2 mg weekly  .   Following a carbohydrate modified diet 6 days per week. Denies numbness, burning and tingling of extremities. Appetite is good.   Diet reviewed . His highes blood sugars have occurredn on Friday and Saturday nights frm 1  2) LEFT HIP PAIN: referred to [facility], saw Martha Clan,  advised to have hip replacement after a trial of PT to allow MRI t be done.  Using tramadol 100 mg at bedtime but in pain most of the day,  brought on with  changing  position,  walking.       Outpatient Medications Prior to Visit  Medication Sig Dispense Refill   ASPIRIN LOW DOSE 81 MG EC tablet TAKE 1 TABLET BY MOUTH  DAILY (SWALLOW WHOLE) 90 tablet 1   blood glucose meter kit and supplies Dispense based on patient and insurance  preference. Use up to four times daily as directed. (FOR ICD-10 E10.9, E11.9). Use to check blood sugar three times daily 1 each 0   clobetasol cream (TEMOVATE) 0.05 % Apply topically 2 (two) times daily.     clopidogrel (PLAVIX) 75 MG tablet Take 75 mg by mouth daily.     Continuous Blood Gluc Sensor (FREESTYLE LIBRE 2 SENSOR) MISC Use to check sugar at least 4 times daily 1 each 2   fluticasone (FLONASE) 50 MCG/ACT nasal spray PLACE 2 SPRAYS INTO BOTH NOSTRILS DAILY. AT NIGHT AFTER NASAL SALINE 16 g 2   furosemide (LASIX) 20 MG tablet TAKE 1 TABLET EVERY OTHER DAY 45 tablet 3   gabapentin (NEURONTIN) 100 MG capsule TAKE 2 CAPSULES THREE TIMES DAILY 540 capsule 3   insulin aspart (NOVOLOG FLEXPEN) 100 UNIT/ML FlexPen Inject up to 25 units with breakfast, 25 units with lunch, and 30 units with supper 72 mL 2   insulin degludec (TRESIBA FLEXTOUCH) 200 UNIT/ML FlexTouch Pen Inject 88 Units into the skin daily. (Patient taking differently: Inject 86 Units into the skin daily.) 48 mL 2   Insulin Pen Needle 32G X 6 MM MISC Use daily with insulin pen 50 each 0   JARDIANCE 25 MG TABS tablet TAKE 1 TABLET EVERY DAY (Patient taking differently: Take 25 mg by mouth daily.) 90 tablet 3   ketoconazole (NIZORAL) 2 %  cream SMARTSIG:1 Application Topical 1 to 2 Times Daily     Lancets (FREESTYLE) lancets 1 each by Other route 2 (two) times daily. DX 250.02 100 each 12   losartan (COZAAR) 100 MG tablet TAKE 1 TABLET EVERY DAY 90 tablet 3   metoprolol tartrate (LOPRESSOR) 25 MG tablet Take 1 tablet (25 mg total) by mouth 2 (two) times daily. 180 tablet 1   ONETOUCH VERIO test strip USE TO CHECK BLOOD SUGAR 3  TIMES DAILY 300 strip 3   rosuvastatin (CRESTOR) 40 MG tablet Take 1 tablet (40 mg total) by mouth every evening for 360 doses. 90 tablet 3   Semaglutide, 2 MG/DOSE, (OZEMPIC, 2 MG/DOSE,) 8 MG/3ML SOPN Inject 2 mg into the skin once a week. 8 mL 2   tamsulosin (FLOMAX) 0.4 MG CAPS capsule TAKE 1 CAPSULE EVERY  DAY (Patient taking differently: Take 0.4 mg by mouth daily.) 90 capsule 3   triamcinolone (KENALOG) 0.025 % cream Apply topically.     Upadacitinib ER (RINVOQ) 15 MG TB24 Take 15 mg by mouth daily.     Vitamin D, Ergocalciferol, (DRISDOL) 1.25 MG (50000 UNIT) CAPS capsule Take 50,000 Units by mouth once a week.     traMADol (ULTRAM) 50 MG tablet Take 1 tablet (50 mg total) by mouth every 12 (twelve) hours as needed. 180 tablet 1   amoxicillin-clavulanate (AUGMENTIN) 875-125 MG tablet Take 1 tablet by mouth 2 (two) times daily. 20 tablet 0   cyclobenzaprine (FLEXERIL) 10 MG tablet Take 1 tablet (10 mg total) by mouth 3 (three) times daily as needed for muscle spasms. 30 tablet 0   No facility-administered medications prior to visit.    Review of Systems;  Patient denies headache, fevers, malaise, unintentional weight loss, skin rash, eye pain, sinus congestion and sinus pain, sore throat, dysphagia,  hemoptysis , cough, dyspnea, wheezing, chest pain, palpitations, orthopnea, edema, abdominal pain, nausea, melena, diarrhea, constipation, flank pain, dysuria, hematuria, urinary  Frequency, nocturia, numbness, tingling, seizures,  Focal weakness, Loss of consciousness,  Tremor, insomnia, depression, anxiety, and suicidal ideation.      Objective:  BP 134/68   Pulse 82   Temp 98 F (36.7 C) (Oral)   Ht 5\' 11"  (1.803 m)   Wt (!) 315 lb (142.9 kg)   SpO2 95%   BMI 43.93 kg/m   BP Readings from Last 3 Encounters:  12/31/22 134/68  10/01/22 122/80  09/18/22 130/62    Wt Readings from Last 3 Encounters:  12/31/22 (!) 315 lb (142.9 kg)  10/01/22 (!) 310 lb (140.6 kg)  09/18/22 (!) 311 lb 3.2 oz (141.2 kg)    Physical Exam Vitals reviewed.  Constitutional:      General: He is not in acute distress.    Appearance: Normal appearance. He is obese. He is not ill-appearing, toxic-appearing or diaphoretic.  HENT:     Head: Normocephalic.  Eyes:     General: No scleral icterus.        Right eye: No discharge.        Left eye: No discharge.     Conjunctiva/sclera: Conjunctivae normal.  Cardiovascular:     Rate and Rhythm: Normal rate and regular rhythm.     Heart sounds: Normal heart sounds.  Pulmonary:     Effort: Pulmonary effort is normal. No respiratory distress.     Breath sounds: Normal breath sounds.  Musculoskeletal:        General: Normal range of motion.     Cervical back: Normal range  of motion.     Right lower leg: Edema present.     Left lower leg: Edema present.  Skin:    General: Skin is warm and dry.  Neurological:     General: No focal deficit present.     Mental Status: He is alert and oriented to person, place, and time. Mental status is at baseline.  Psychiatric:        Mood and Affect: Mood normal.        Behavior: Behavior normal.        Thought Content: Thought content normal.        Judgment: Judgment normal.     Lab Results  Component Value Date   HGBA1C 7.6 (H) 12/27/2022   HGBA1C 8.3 (H) 09/27/2022   HGBA1C 8.0 (H) 03/27/2022    Lab Results  Component Value Date   CREATININE 0.77 12/27/2022   CREATININE 0.66 09/27/2022   CREATININE 0.63 06/27/2022    Lab Results  Component Value Date   WBC 8.8 09/27/2022   HGB 14.9 09/27/2022   HCT 44.1 09/27/2022   PLT 257.0 09/27/2022   GLUCOSE 78 12/27/2022   CHOL 137 12/27/2022   TRIG 147.0 12/27/2022   HDL 34.40 (L) 12/27/2022   LDLDIRECT 76.0 12/27/2022   LDLCALC 73 12/27/2022   ALT 23 12/27/2022   AST 15 12/27/2022   NA 138 12/27/2022   K 3.9 12/27/2022   CL 100 12/27/2022   CREATININE 0.77 12/27/2022   BUN 14 12/27/2022   CO2 29 12/27/2022   TSH 2.29 03/27/2022   PSA 0.47 03/27/2022   INR 1.0 06/24/2022   HGBA1C 7.6 (H) 12/27/2022   MICROALBUR 3.0 (H) 12/27/2022    DG Lumbar Spine Complete  Result Date: 09/21/2022 CLINICAL DATA:  Stabbing left-sided back pain EXAM: LUMBAR SPINE - COMPLETE 4+ VIEW COMPARISON:  01/16/2013 FINDINGS: Minimal scoliotic curvature.  Disc space narrowing throughout the lower lumbar region. Ordinary lower lumbar facet osteoarthritis. Mild sacroiliac osteoarthritis. No fracture or focal lesion. IMPRESSION: No acute or traumatic finding. Minimal scoliotic curvature. 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 Mild sacroiliac osteoarthritis. Electronically Signed   By: Paulina Fusi M.D.   On: 09/21/2022 13:32   DG Abd 2 Views  Result Date: 09/19/2022 CLINICAL DATA:  Left CVA stabbing pain.  Evaluate for stones. EXAM: ABDOMEN - 2 VIEW COMPARISON:  January 05, 2020 KUB FINDINGS: Lung bases are normal. No free air, portal venous gas, or pneumatosis. Both kidneys are partially obscured by bowel contents but no renal stones are noted. Calcifications in the pelvis are likely phleboliths. Moderate fecal loading throughout the colon.  No bowel obstruction. Degenerative changes throughout the lumbar spine and in the hips. IMPRESSION: 1. No renal stones identified. 2. Moderate fecal loading throughout the colon. 3. Degenerative changes in the spine and hips. Electronically Signed   By: Gerome Sam III M.D.   On: 09/19/2022 11:03    Assessment & Plan:  .Encounter for long-term (current) use of insulin (HCC)  Long-term current use of injectable noninsulin antidiabetic medication Assessment & Plan: He is taking 0zempic  2 mg weekly   Spinal stenosis of lumbar region with radiculopathy -     traMADol HCl; Take 1 tablet (50 mg total) by mouth every 6 (six) hours as needed.  Dispense: 360 tablet; Refill: 1  OSA on CPAP -     For home use only DME Other see comment  Hypertension associated with diabetes [facility]) Assessment & Plan: Well controlled on current  regimen of amlodipine losartan and furosmide. Renal function stable, no changes today.  Lab Results  Component Value Date   CREATININE 0.77 12/27/2022   Lab Results  Component Value Date   NA 138 12/27/2022   K 3.9 12/27/2022   CL 100 12/27/2022   CO2 29  12/27/2022     Obesity, diabetes, and hypertension syndrome (HCC) Assessment & Plan: Improved control based on current A1c.  With current Fructosamine level suggesting an even lower A1c of 6.1, which appears less congruous with the data  I have downloaded and reviewed the data from patient's continuous blood glucose monitor.  Medication changes were made based on this review as follows: continue current regimen, but increase evening novolog dose to 32 units   Lab Results  Component Value Date   HGBA1C 7.6 (H) 12/27/2022       Follow-up: Return in about 3 months (around 04/01/2023) for follow up diabetes, chronic pain management.   Sherlene Shams, MD

## 2022-12-31 NOTE — Patient Instructions (Addendum)
Your diabetes control is improving.  Blood sugars are MUCH BETTER.  The highest ones are occurring on Friday and Saturday nights after 9 pm.  . So add 2 units to your dinnertime insulin dose .  Your kidney function IS normal.   The protein in your urine is SIGNIFICANTLY LOWER THAN LAST YEAR (EXCELLENT TREND)  I have increased the tramadol to allow  you to use 4 daily  until your hip pain improves.

## 2022-12-31 NOTE — Assessment & Plan Note (Signed)
He is taking 0zempic  2 mg weekly

## 2022-12-31 NOTE — Telephone Encounter (Signed)
Medication Samples have been provided to the patient.  Drug name: Jardiance       Strength: 25mg         Qty: 4 boxes  LOT: [id number], [id number] Exp.Date: 12/2024, 05/2024  Dosing instructions: Take 1 tablet by mouth daily  The patient has been instructed regarding the correct time, dose, and frequency of taking this medication, including desired effects and most common side effects.   Betsy Rosello 3:54 PM 12/31/2022

## 2022-12-31 NOTE — Assessment & Plan Note (Signed)
Well controlled on current regimen of amlodipine losartan and furosmide. Renal function stable, no changes today.  Lab Results  Component Value Date   CREATININE 0.77 12/27/2022   Lab Results  Component Value Date   NA 138 12/27/2022   K 3.9 12/27/2022   CL 100 12/27/2022   CO2 29 12/27/2022

## 2022-12-31 NOTE — Assessment & Plan Note (Signed)
Improved control based on current A1c.  With current Fructosamine level suggesting an even lower A1c of 6.1, which appears less congruous with the data  I have downloaded and reviewed the data from patient's continuous blood glucose monitor.  Medication changes were made based on this review as follows: continue current regimen, but increase evening novolog dose to 32 units   Lab Results  Component Value Date   HGBA1C 7.6 (H) 12/27/2022

## 2023-01-01 LAB — FRUCTOSAMINE: Fructosamine: 265 umol/L (ref 205–285)

## 2023-01-04 ENCOUNTER — Other Ambulatory Visit: Payer: Self-pay | Admitting: Internal Medicine

## 2023-01-07 DIAGNOSIS — E1165 Type 2 diabetes mellitus with hyperglycemia: Secondary | ICD-10-CM | POA: Diagnosis not present

## 2023-01-09 DIAGNOSIS — M5416 Radiculopathy, lumbar region: Secondary | ICD-10-CM | POA: Diagnosis not present

## 2023-01-17 ENCOUNTER — Encounter: Payer: Self-pay | Admitting: Internal Medicine

## 2023-01-21 ENCOUNTER — Ambulatory Visit (INDEPENDENT_AMBULATORY_CARE_PROVIDER_SITE_OTHER): Payer: Medicare HMO | Admitting: *Deleted

## 2023-01-21 VITALS — Ht 71.0 in | Wt 315.0 lb

## 2023-01-21 DIAGNOSIS — Z0000 Encounter for general adult medical examination without abnormal findings: Secondary | ICD-10-CM | POA: Diagnosis not present

## 2023-01-21 NOTE — Progress Notes (Addendum)
Subjective:   Connor Ross is a 57 y.o. male who presents for Medicare Annual/Subsequent preventive examination.  Visit Complete: Virtual  I connected with  Connor Ross on 01/21/23 by a audio enabled telemedicine application and verified that I am speaking with the correct person using two identifiers.  Patient Location: Home  Provider Location: Office/Clinic  I discussed the limitations of evaluation and management by telemedicine. The patient expressed understanding and agreed to proceed.   Review of Systems     Cardiac Risk Factors include: advanced age (>77men, >60 women);diabetes mellitus;dyslipidemia;hypertension;male gender;obesity (BMI >30kg/m2);sedentary lifestyle     Objective:    Today's Vitals   01/21/23 1104 01/21/23 1105  Weight: (!) 315 lb (142.9 kg)   Height: 5\' 11"  (1.803 m)   PainSc:  7    Body mass index is 43.93 kg/m.     01/21/2023   11:30 AM 06/25/2022    6:45 PM 06/23/2022   10:18 PM 01/07/2022    1:08 PM 01/03/2021    2:18 PM 01/03/2020    2:24 PM  Advanced Directives  Does Patient Have a Medical Advance Directive? No  No No No No  Would patient like information on creating a medical advance directive?  No - Patient declined  No - Patient declined No - Patient declined No - Guardian declined    Current Medications (verified) Outpatient Encounter Medications as of 01/21/2023  Medication Sig   ASPIRIN LOW DOSE 81 MG EC tablet TAKE 1 TABLET BY MOUTH  DAILY (SWALLOW WHOLE)   blood glucose meter kit and supplies Dispense based on patient and insurance preference. Use up to four times daily as directed. (FOR ICD-10 E10.9, E11.9). Use to check blood sugar three times daily   clobetasol cream (TEMOVATE) 0.05 % Apply topically 2 (two) times daily.   clopidogrel (PLAVIX) 75 MG tablet Take 75 mg by mouth daily.   Continuous Blood Gluc Sensor (FREESTYLE LIBRE 2 SENSOR) MISC Use to check sugar at least 4 times daily   fluticasone (FLONASE)  50 MCG/ACT nasal spray PLACE 2 SPRAYS INTO BOTH NOSTRILS DAILY. AT NIGHT AFTER NASAL SALINE   furosemide (LASIX) 20 MG tablet TAKE 1 TABLET EVERY OTHER DAY   gabapentin (NEURONTIN) 100 MG capsule TAKE 2 CAPSULES THREE TIMES DAILY   insulin aspart (NOVOLOG FLEXPEN) 100 UNIT/ML FlexPen Inject up to 25 units with breakfast, 25 units with lunch, and 30 units with supper   insulin degludec (TRESIBA FLEXTOUCH) 200 UNIT/ML FlexTouch Pen Inject 88 Units into the skin daily. (Patient taking differently: Inject 86 Units into the skin daily.)   Insulin Pen Needle 32G X 6 MM MISC Use daily with insulin pen   JARDIANCE 25 MG TABS tablet TAKE 1 TABLET EVERY DAY (Patient taking differently: Take 25 mg by mouth daily.)   ketoconazole (NIZORAL) 2 % cream SMARTSIG:1 Application Topical 1 to 2 Times Daily   Lancets (FREESTYLE) lancets 1 each by Other route 2 (two) times daily. DX 250.02   losartan (COZAAR) 100 MG tablet TAKE 1 TABLET EVERY DAY   metoprolol tartrate (LOPRESSOR) 25 MG tablet Take 1 tablet (25 mg total) by mouth 2 (two) times daily.   Multiple Vitamin (MULTIVITAMIN) tablet Take 1 tablet by mouth daily.   ONETOUCH VERIO test strip USE TO CHECK BLOOD SUGAR 3  TIMES DAILY   rosuvastatin (CRESTOR) 40 MG tablet Take 1 tablet (40 mg total) by mouth every evening for 360 doses.   Semaglutide, 2 MG/DOSE, (OZEMPIC, 2 MG/DOSE,) 8 MG/3ML SOPN  Inject 2 mg into the skin once a week.   tamsulosin (FLOMAX) 0.4 MG CAPS capsule TAKE 1 CAPSULE EVERY DAY   traMADol (ULTRAM) 50 MG tablet Take 1 tablet (50 mg total) by mouth every 6 (six) hours as needed.   triamcinolone (KENALOG) 0.025 % cream Apply topically.   Upadacitinib ER (RINVOQ) 15 MG TB24 Take 15 mg by mouth daily.   Vitamin D, Ergocalciferol, (DRISDOL) 1.25 MG (50000 UNIT) CAPS capsule Take 50,000 Units by mouth once a week.   No facility-administered encounter medications on file as of 01/21/2023.    Allergies (verified) Statins, Hctz [hydrochlorothiazide],  and Metformin and related   History: Past Medical History:  Diagnosis Date   Diabetes mellitus    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 type 2 diabetes mellitus, limited to breakdown of skin (HCC) 04/23/2019   Fracture of one rib, left side, subsequent encounter for fracture with routine healing 08/27/2020   Hyperlipidemia    Hypertension    Normal cardiac stress test 2012   [facility]   Past Surgical History:  Procedure Laterality Date   CORONARY STENT INTERVENTION N/A 06/25/2022   Procedure: CORONARY STENT INTERVENTION;  Surgeon: Alwyn Pea, MD;  Location: [facility] INVASIVE CV [facility];  Service: Cardiovascular;  Laterality: N/A;   CORONARY STENT INTERVENTION N/A 06/26/2022   Procedure: CORONARY STENT INTERVENTION;  Surgeon: Alwyn Pea, MD;  Location: [facility] INVASIVE CV [facility];  Service: Cardiovascular;  Laterality: N/A;   EXOSTECTOMY Right 06/2019   5th MT head  ([facility])   LEFT HEART CATH AND CORONARY ANGIOGRAPHY N/A 06/25/2022   Procedure: LEFT HEART CATH AND CORONARY ANGIOGRAPHY WITH POSSIBLE STENT;  Surgeon: Alwyn Pea, MD;  Location: [facility] INVASIVE CV LAB;  Service: Cardiovascular;  Laterality: N/A;  12:30 following DC first case   LUMBAR DISC SURGERY     Family History  Problem Relation Age of Onset   Coronary artery disease Mother 25   COPD Mother    Heart disease Mother    Hyperlipidemia Mother    Hypertension Mother    Cancer Father    Hyperlipidemia Father    Hypertension Brother    Cancer Brother        lymphoma   Diabetes Maternal Aunt    Diabetes Maternal Uncle    Diabetes Maternal Grandmother    Heart disease Maternal Grandmother    Social History   Socioeconomic History   Marital status: Married    Spouse name: Not on file   Number of children: Not on file   Years of education: Not on file   Highest education level: 12th grade  Occupational History   Not on file  Tobacco Use   Smoking status: Never   Smokeless tobacco: Never   Substance and Sexual Activity   Alcohol use: No   Drug use: No   Sexual activity: Not on file  Other Topics Concern   Not on file  Social History Narrative   Married   Social Determinants of Health   Financial Resource Strain: Low Risk  (01/21/2023)   Overall Financial Resource Strain (CARDIA)    Difficulty of Paying Living Expenses: Not hard at all  Recent Concern: Financial Resource Strain - High Risk (12/30/2022)   Overall Financial Resource Strain (CARDIA)    Difficulty of Paying Living Expenses: Hard  Food Insecurity: No Food Insecurity (01/21/2023)   Hunger Vital Sign    Worried About Running Out of Food in the Last Year: Never true  Ran Out of Food in the Last Year: Never true  Transportation Needs: No Transportation Needs (01/21/2023)   PRAPARE - Administrator, Civil Service (Medical): No    Lack of Transportation (Non-Medical): No  Physical Activity: Inactive (01/21/2023)   Exercise Vital Sign    Days of Exercise per Week: 0 days    Minutes of Exercise per Session: 0 min  Stress: No Stress Concern Present (01/21/2023)   [employer] of Occupational Health - Occupational Stress Questionnaire    Feeling of Stress : Not at all  Social Connections: Moderately Integrated (01/21/2023)   Social Connection and Isolation Panel [NHANES]    Frequency of Communication with Friends and Family: More than three times a week    Frequency of Social Gatherings with Friends and Family: More than three times a week    Attends Religious Services: More than 4 times per year    Active Member of [employer] or Organizations: No    Attends Engineer, structural: Never    Marital Status: Married    Tobacco Counseling Counseling given: Not Answered   Clinical Intake:  Pre-visit preparation completed: Yes  Pain : 0-10 Pain Score: 7  Pain Type: Chronic pain Pain Location: Other (Comment) (all over) Pain Orientation: Other (Comment) (all over) Pain Descriptors /  Indicators: Constant Pain Onset: More than a month ago Pain Frequency: Constant     BMI - recorded: 43.93 Nutritional Status: BMI > 30  Obese Nutritional Risks: None Diabetes: Yes CBG done?: Yes (124) CBG resulted in Enter/ Edit results?: No Did pt. bring in CBG monitor from home?: No  How often do you need to have someone help you when you read instructions, pamphlets, or other written materials from your doctor or pharmacy?: 1 - Never  Interpreter Needed?: No  Information entered by :: R. Jenna Ardoin LPN   Activities of Daily Living    01/21/2023   11:09 AM 01/17/2023    4:17 PM  In your present state of health, do you have any difficulty performing the following activities:  Hearing? 1 0  Comment some issues   Vision? 0 0  Comment glasses   Difficulty concentrating or making decisions? 1 0  Comment remembering things at times   Walking or climbing stairs? 1 1  Comment because of back and hip   Dressing or bathing? 1 1  Comment needs help with compression socks, wife helps   Doing errands, shopping? 0 1  Preparing Food and eating ? N N  Using the Toilet? N N  In the past six months, have you accidently leaked urine? Malvin Johns  Comment because of back and wears guards   Do you have problems with loss of bowel control? Y N  Comment diarrhea with new medication   Managing your Medications? N N  Managing your Finances? N N  Housekeeping or managing your Housekeeping? N N    Patient Care Team: Sherlene Shams, MD as PCP - General (Internal Medicine)  Indicate any recent Medical Services you may have received from other than [facility] providers in the past year (date may be approximate).     Assessment:   This is a routine wellness examination for Connor Ross.  Hearing/Vision screen No results found.  Dietary issues and exercise activities discussed:     Goals Addressed             This Visit's Progress    Patient Stated       Wants to have  surgery on his hip so he can be  more mobile       Depression Screen    01/21/2023   11:25 AM 12/31/2022    3:22 PM 10/01/2022    2:28 PM 09/18/2022    2:52 PM 07/03/2022    9:12 AM 04/03/2022    3:16 PM 01/07/2022    1:09 PM  PHQ 2/9 Scores  PHQ - 2 Score 0 0 0 0 0 0 0  PHQ- 9 Score 1          Fall Risk    01/17/2023    4:17 PM 12/31/2022    3:22 PM 10/01/2022    2:28 PM 09/18/2022    2:52 PM 07/03/2022    9:12 AM  Fall Risk   Falls in the past year? 1 1 0 0 1  Number falls in past yr: 0 0 0 0 1  Injury with Fall? 0 0 0 0 0  Risk for fall due to :  History of fall(s) No Fall Risks No Fall Risks History of fall(s)  Follow up  Falls evaluation completed Falls evaluation completed Falls evaluation completed Falls evaluation completed    MEDICARE RISK AT HOME:   TCognitive Function:        01/21/2023   11:31 AM 01/03/2021    2:32 PM 01/03/2020    2:23 PM  6CIT Screen  What Year? 0 points 0 points 0 points  What month? 0 points 0 points 0 points  What time? 0 points    Count back from 20 0 points    Months in reverse 0 points 0 points 0 points  Repeat phrase 0 points 0 points 0 points  Total Score 0 points      Immunizations Immunization History  Administered Date(s) Administered   Tdap 10/16/2011, 09/11/2021    TDAP status: Up to date  Flu Vaccine status: Declined, Education has been provided regarding the importance of this vaccine but patient still declined. Advised may receive this vaccine at local pharmacy or Health Dept. Aware to provide a copy of the vaccination record if obtained from local pharmacy or Health Dept. Verbalized acceptance and understanding.  Pneumococcal vaccine status: Declined,  Education has been provided regarding the importance of this vaccine but patient still declined. Advised may receive this vaccine at local pharmacy or Health Dept. Aware to provide a copy of the vaccination record if obtained from local pharmacy or Health Dept. Verbalized acceptance and understanding.    Covid-19 vaccine status: Declined, Education has been provided regarding the importance of this vaccine but patient still declined. Advised may receive this vaccine at local pharmacy or Health Dept.or vaccine clinic. Aware to provide a copy of the vaccination record if obtained from local pharmacy or Health Dept. Verbalized acceptance and understanding.  Qualifies for Shingles Vaccine? Yes   Zostavax completed No   Shingrix Completed?: No.    Education has been provided regarding the importance of this vaccine. Patient has been advised to call insurance company to determine out of pocket expense if they have not yet received this vaccine. Advised may also receive vaccine at local pharmacy or Health Dept. Verbalized acceptance and understanding.  Screening Tests Health Maintenance  Topic Date Due   Medicare Annual Wellness (AWV)  01/08/2023   INFLUENZA VACCINE  02/13/2023   HEMOGLOBIN A1C  06/28/2023   OPHTHALMOLOGY EXAM  08/15/2023   Diabetic kidney evaluation - eGFR measurement  12/27/2023   Diabetic kidney evaluation - Urine ACR  12/27/2023  FOOT EXAM  12/31/2023   Fecal DNA (Cologuard)  04/19/2024   DTaP/Tdap/Td (3 - Td or Tdap) 09/12/2031   Hepatitis C Screening  Completed   HIV Screening  Completed   HPV VACCINES  Aged Out   COVID-19 Vaccine  Discontinued   Zoster Vaccines- Shingrix  Discontinued    Health Maintenance  Health Maintenance Due  Topic Date Due   Medicare Annual Wellness (AWV)  01/08/2023    Colorectal cancer screening: Type of screening: Cologuard. Completed 10/22. Repeat every 3 years  Lung Cancer Screening: (Low Dose CT Chest recommended if Age 12-80 years, 20 pack-year currently smoking OR have quit w/in 15years.) does not qualify.   Additional Screening:  Hepatitis C Screening: does qualify; Completed 3/24  Vision Screening: Recommended annual ophthalmology exams for early detection of glaucoma and other disorders of the eye. Is the patient up to  date with their annual eye exam?  Yes  Who is the provider or what is the name of the office in which the patient attends annual eye exams? [facility] If pt is not established with a provider, would they like to be referred to a provider to establish care? No .   Dental Screening: Recommended annual dental exams for proper oral hygiene  Diabetic Foot Exam: Diabetic Foot Exam: Completed 6/24  Community Resource Referral / Chronic Care Management: CRR required this visit?  No   CCM required this visit?  No     Plan:     I have personally reviewed and noted the following in the patient's chart:   Medical and social history Use of alcohol, tobacco or illicit drugs  Current medications and supplements including opioid prescriptions. Patient is currently taking opioid prescriptions. Information provided to patient regarding non-opioid alternatives. Patient advised to discuss non-opioid treatment plan with their provider. Functional ability and status Nutritional status Physical activity Advanced directives List of other physicians Hospitalizations, surgeries, and ER visits in previous 12 months Vitals Screenings to include cognitive, depression, and falls Referrals and appointments  In addition, I have reviewed and discussed with patient certain preventive protocols, quality metrics, and best practice recommendations. A written personalized care plan for preventive services as well as general preventive health recommendations were provided to patient.     Sydell Axon, LPN   07/20/1094   After Visit Summary: (MyChart) Due to this being a telephonic visit, the after visit summary with patients personalized plan was offered to patient via MyChart   Nurse Notes: None   I have reviewed the above information and agree with above.   Duncan Dull, MD

## 2023-01-21 NOTE — Patient Instructions (Signed)
Connor Ross , Thank you for taking time to come for your Medicare Wellness Visit. I appreciate your ongoing commitment to your health goals. Please review the following plan we discussed and let me know if I can assist you in the future.   These are the goals we discussed:  Goals      Increase physical activity     Healthy diet Stay hydrated Stay active     Patient Stated     Wants to have surgery on his hip so he can be more mobile        This is a list of the screening recommended for you and due dates:  Health Maintenance  Topic Date Due   Medicare Annual Wellness Visit  01/08/2023   Flu Shot  02/13/2023   Hemoglobin A1C  06/28/2023   Eye exam for diabetics  08/15/2023   Yearly kidney function blood test for diabetes  12/27/2023   Yearly kidney health urinalysis for diabetes  12/27/2023   Complete foot exam   12/31/2023   Cologuard (Stool DNA test)  04/19/2024   DTaP/Tdap/Td vaccine (3 - Td or Tdap) 09/12/2031   Hepatitis C Screening  Completed   HIV Screening  Completed   HPV Vaccine  Aged Out   COVID-19 Vaccine  Discontinued   Zoster (Shingles) Vaccine  Discontinued    Advanced directives: Will consider getting this done  Conditions/risks identified: None  Next appointment: Follow up in one year for your annual wellness visit 01/26/24 @ 2:00Managing Pain Without Opioids Opioids are strong medicines used to treat moderate to severe pain. For some people, especially those who have long-term (chronic) pain, opioids may not be the best choice for pain management due to: Side effects like nausea, constipation, and sleepiness. The risk of addiction (opioid use disorder). The longer you take opioids, the greater your risk of addiction. Pain that lasts for more than 3 months is called chronic pain. Managing chronic pain usually requires more than one approach and is often provided by a team of health care providers working together (multidisciplinary approach). Pain  management may be done at a pain management center or pain clinic. How to manage pain without the use of opioids Use non-opioid medicines Non-opioid medicines for pain may include: Over-the-counter or prescription non-steroidal anti-inflammatory drugs (NSAIDs). These may be the first medicines used for pain. They work well for muscle and bone pain, and they reduce swelling. Acetaminophen. This over-the-counter medicine may work well for milder pain but not swelling. Antidepressants. These may be used to treat chronic pain. A certain type of antidepressant (tricyclics) is often used. These medicines are given in lower doses for pain than when used for depression. Anticonvulsants. These are usually used to treat seizures but may also reduce nerve (neuropathic) pain. Muscle relaxants. These relieve pain caused by sudden muscle tightening (spasms). You may also use a pain medicine that is applied to the skin as a patch, cream, or gel (topical analgesic), such as a numbing medicine. These may cause fewer side effects than medicines taken by mouth. Do certain therapies as directed Some therapies can help with pain management. They include: Physical therapy. You will do exercises to gain strength and flexibility. A physical therapist may teach you exercises to move and stretch parts of your body that are weak, stiff, or painful. You can learn these exercises at physical therapy visits and practice them at home. Physical therapy may also involve: Massage. Heat wraps or applying heat or cold to affected areas.  Electrical signals that interrupt pain signals (transcutaneous electrical nerve stimulation, TENS). Weak lasers that reduce pain and swelling (low-level laser therapy). Signals from your body that help you learn to regulate pain (biofeedback). Occupational therapy. This helps you to learn ways to function at home and work with less pain. Recreational therapy. This involves trying new activities or  hobbies, such as a physical activity or drawing. Mental health therapy, including: Cognitive behavioral therapy (CBT). This helps you learn coping skills for dealing with pain. Acceptance and commitment therapy (ACT) to change the way you think and react to pain. Relaxation therapies, including muscle relaxation exercises and mindfulness-based stress reduction. Pain management counseling. This may be individual, family, or group counseling.  Receive medical treatments Medical treatments for pain management include: Nerve block injections. These may include a pain blocker and anti-inflammatory medicines. You may have injections: Near the spine to relieve chronic back or neck pain. Into joints to relieve back or joint pain. Into nerve areas that supply a painful area to relieve body pain. Into muscles (trigger point injections) to relieve some painful muscle conditions. A medical device placed near your spine to help block pain signals and relieve nerve pain or chronic back pain (spinal cord stimulation device). Acupuncture. Follow these instructions at home Medicines Take over-the-counter and prescription medicines only as told by your health care provider. If you are taking pain medicine, ask your health care providers about possible side effects to watch out for. Do not drive or use heavy machinery while taking prescription opioid pain medicine. Lifestyle  Do not use drugs or alcohol to reduce pain. If you drink alcohol, limit how much you have to: 0-1 drink a day for women who are not pregnant. 0-2 drinks a day for men. Know how much alcohol is in a drink. In the [place] one drink equals one 12 oz bottle of beer (355 mL), one 5 oz glass of wine (148 mL), or one 1 oz glass of hard liquor (44 mL). Do not use any products that contain nicotine or tobacco. These products include cigarettes, chewing tobacco, and vaping devices, such as e-cigarettes. If you need help quitting, ask your health  care provider. Eat a healthy diet and maintain a healthy weight. Poor diet and excess weight may make pain worse. Eat foods that are high in fiber. These include fresh fruits and vegetables, whole grains, and beans. Limit foods that are high in fat and processed sugars, such as fried and sweet foods. Exercise regularly. Exercise lowers stress and may help relieve pain. Ask your health care provider what activities and exercises are safe for you. If your health care provider approves, join an exercise class that combines movement and stress reduction. Examples include yoga and tai chi. Get enough sleep. Lack of sleep may make pain worse. Lower stress as much as possible. Practice stress reduction techniques as told by your therapist. General instructions Work with all your pain management providers to find the treatments that work best for you. You are an important member of your pain management team. There are many things you can do to reduce pain on your own. Consider joining an online or in-person support group for people who have chronic pain. Keep all follow-up visits. This is important. Where to find more information You can find more information about managing pain without opioids from: American Academy of Pain Medicine: painmed.org Institute for Chronic Pain: instituteforchronicpain.org American Chronic Pain Association: theacpa.org Contact a health care provider if: You have side effects from  pain medicine. Your pain gets worse or does not get better with treatments or home therapy. You are struggling with anxiety or depression. Summary Many types of pain can be managed without opioids. Chronic pain may respond better to pain management without opioids. Pain is best managed when you and a team of health care providers work together. Pain management without opioids may include non-opioid medicines, medical treatments, physical therapy, mental health therapy, and lifestyle  changes. Tell your health care providers if your pain gets worse or is not being managed well enough. This information is not intended to replace advice given to you by your health care provider. Make sure you discuss any questions you have with your health care provider. Document Revised: 10/11/2020 Document Reviewed: 10/11/2020 Elsevier Patient Education  2024 Elsevier Inc.   Preventive Care 40-64 Years, Male Preventive care refers to lifestyle choices and visits with your health care provider that can promote health and wellness. What does preventive care include? A yearly physical exam. This is also called an annual well check. Dental exams once or twice a year. Routine eye exams. Ask your health care provider how often you should have your eyes checked. Personal lifestyle choices, including: Daily care of your teeth and gums. Regular physical activity. Eating a healthy diet. Avoiding tobacco and drug use. Limiting alcohol use. Practicing safe sex. Taking low-dose aspirin every day starting at age 74. What happens during an annual well check? The services and screenings done by your health care provider during your annual well check will depend on your age, overall health, lifestyle risk factors, and family history of disease. Counseling  Your health care provider may ask you questions about your: Alcohol use. Tobacco use. Drug use. Emotional well-being. Home and relationship well-being. Sexual activity. Eating habits. Work and work Astronomer. Screening  You may have the following tests or measurements: Height, weight, and BMI. Blood pressure. Lipid and cholesterol levels. These may be checked every 5 years, or more frequently if you are over 23 years old. Skin check. Lung cancer screening. You may have this screening every year starting at age 68 if you have a 30-pack-year history of smoking and currently smoke or have quit within the past 15 years. Fecal occult blood  test (FOBT) of the stool. You may have this test every year starting at age 67. Flexible sigmoidoscopy or colonoscopy. You may have a sigmoidoscopy every 5 years or a colonoscopy every 10 years starting at age 67. Prostate cancer screening. Recommendations will vary depending on your family history and other risks. Hepatitis C blood test. Hepatitis B blood test. Sexually transmitted disease (STD) testing. Diabetes screening. This is done by checking your blood sugar (glucose) after you have not eaten for a while (fasting). You may have this done every 1-3 years. Discuss your test results, treatment options, and if necessary, the need for more tests with your health care provider. Vaccines  Your health care provider may recommend certain vaccines, such as: Influenza vaccine. This is recommended every year. Tetanus, diphtheria, and acellular pertussis (Tdap, Td) vaccine. You may need a Td booster every 10 years. Zoster vaccine. You may need this after age 71. Pneumococcal 13-valent conjugate (PCV13) vaccine. You may need this if you have certain conditions and have not been vaccinated. Pneumococcal polysaccharide (PPSV23) vaccine. You may need one or two doses if you smoke cigarettes or if you have certain conditions. Talk to your health care provider about which screenings and vaccines you need and how often you need  them. This information is not intended to replace advice given to you by your health care provider. Make sure you discuss any questions you have with your health care provider. Document Released: 07/28/2015 Document Revised: 03/20/2016 Document Reviewed: 05/02/2015 Elsevier Interactive Patient Education  2017 [employer].  Fall Prevention in the Home Falls can cause injuries. They can happen to people of all ages. There are many things you can do to make your home safe and to help prevent falls. What can I do on the outside of my home? Regularly fix the edges of walkways and  driveways and fix any cracks. Remove anything that might make you trip as you walk through a door, such as a raised step or threshold. Trim any bushes or trees on the path to your home. Use bright outdoor lighting. Clear any walking paths of anything that might make someone trip, such as rocks or tools. Regularly check to see if handrails are loose or broken. Make sure that both sides of any steps have handrails. Any raised decks and porches should have guardrails on the edges. Have any leaves, snow, or ice cleared regularly. Use sand or salt on walking paths during winter. Clean up any spills in your garage right away. This includes oil or grease spills. What can I do in the bathroom? Use night lights. Install grab bars by the toilet and in the tub and shower. Do not use towel bars as grab bars. Use non-skid mats or decals in the tub or shower. If you need to sit down in the shower, use a plastic, non-slip stool. Keep the floor dry. Clean up any water that spills on the floor as soon as it happens. Remove soap buildup in the tub or shower regularly. Attach bath mats securely with double-sided non-slip rug tape. Do not have throw rugs and other things on the floor that can make you trip. What can I do in the bedroom? Use night lights. Make sure that you have a light by your bed that is easy to reach. Do not use any sheets or blankets that are too big for your bed. They should not hang down onto the floor. Have a firm chair that has side arms. You can use this for support while you get dressed. Do not have throw rugs and other things on the floor that can make you trip. What can I do in the kitchen? Clean up any spills right away. Avoid walking on wet floors. Keep items that you use a lot in easy-to-reach places. If you need to reach something above you, use a strong step stool that has a grab bar. Keep electrical cords out of the way. Do not use floor polish or wax that makes floors  slippery. If you must use wax, use non-skid floor wax. Do not have throw rugs and other things on the floor that can make you trip. What can I do with my stairs? Do not leave any items on the stairs. Make sure that there are handrails on both sides of the stairs and use them. Fix handrails that are broken or loose. Make sure that handrails are as long as the stairways. Check any carpeting to make sure that it is firmly attached to the stairs. Fix any carpet that is loose or worn. Avoid having throw rugs at the top or bottom of the stairs. If you do have throw rugs, attach them to the floor with carpet tape. Make sure that you have a light switch at  the top of the stairs and the bottom of the stairs. If you do not have them, ask someone to add them for you. What else can I do to help prevent falls? Wear shoes that: Do not have high heels. Have rubber bottoms. Are comfortable and fit you well. Are closed at the toe. Do not wear sandals. If you use a stepladder: Make sure that it is fully opened. Do not climb a closed stepladder. Make sure that both sides of the stepladder are locked into place. Ask someone to hold it for you, if possible. Clearly mark and make sure that you can see: Any grab bars or handrails. First and last steps. Where the edge of each step is. Use tools that help you move around (mobility aids) if they are needed. These include: Canes. Walkers. Scooters. Crutches. Turn on the lights when you go into a dark area. Replace any light bulbs as soon as they burn out. Set up your furniture so you have a clear path. Avoid moving your furniture around. If any of your floors are uneven, fix them. If there are any pets around you, be aware of where they are. Review your medicines with your doctor. Some medicines can make you feel dizzy. This can increase your chance of falling. Ask your doctor what other things that you can do to help prevent falls. This information is not  intended to replace advice given to you by your health care provider. Make sure you discuss any questions you have with your health care provider. Document Released: 04/27/2009 Document Revised: 12/07/2015 Document Reviewed: 08/05/2014 Elsevier Interactive Patient Education  2017 [employer].

## 2023-01-22 DIAGNOSIS — I152 Hypertension secondary to endocrine disorders: Secondary | ICD-10-CM | POA: Diagnosis not present

## 2023-01-22 DIAGNOSIS — R600 Localized edema: Secondary | ICD-10-CM | POA: Diagnosis not present

## 2023-01-22 DIAGNOSIS — I252 Old myocardial infarction: Secondary | ICD-10-CM | POA: Diagnosis not present

## 2023-01-22 DIAGNOSIS — Z6841 Body Mass Index (BMI) 40.0 and over, adult: Secondary | ICD-10-CM | POA: Diagnosis not present

## 2023-01-22 DIAGNOSIS — E669 Obesity, unspecified: Secondary | ICD-10-CM | POA: Diagnosis not present

## 2023-01-22 DIAGNOSIS — M791 Myalgia, unspecified site: Secondary | ICD-10-CM | POA: Diagnosis not present

## 2023-01-22 DIAGNOSIS — E1169 Type 2 diabetes mellitus with other specified complication: Secondary | ICD-10-CM | POA: Diagnosis not present

## 2023-01-22 DIAGNOSIS — E1159 Type 2 diabetes mellitus with other circulatory complications: Secondary | ICD-10-CM | POA: Diagnosis not present

## 2023-01-27 DIAGNOSIS — M5416 Radiculopathy, lumbar region: Secondary | ICD-10-CM | POA: Diagnosis not present

## 2023-01-29 DIAGNOSIS — M5416 Radiculopathy, lumbar region: Secondary | ICD-10-CM | POA: Diagnosis not present

## 2023-02-03 DIAGNOSIS — M5416 Radiculopathy, lumbar region: Secondary | ICD-10-CM | POA: Diagnosis not present

## 2023-02-07 DIAGNOSIS — M5416 Radiculopathy, lumbar region: Secondary | ICD-10-CM | POA: Diagnosis not present

## 2023-02-10 DIAGNOSIS — M5416 Radiculopathy, lumbar region: Secondary | ICD-10-CM | POA: Diagnosis not present

## 2023-02-11 ENCOUNTER — Telehealth: Payer: Self-pay

## 2023-02-11 NOTE — Telephone Encounter (Signed)
Spoke with pt to let him know that we received his pt assistance medication and it is ready for pick up.   Ozempic: 4 boxes Novolog: 12 boxes   Medication Samples have been provided to the patient.  Drug name: Jardiance       Strength: 25 mg        Qty: 5 boxes  LOT: [id number], [id number] Exp.Date: 05/2024, 08/2024  Dosing instructions: Take 1 tablet by mouth daily.   The patient has been instructed regarding the correct time, dose, and frequency of taking this medication, including desired effects and most common side effects.   Connor Ross 12:11 PM 02/11/2023

## 2023-02-12 ENCOUNTER — Telehealth: Payer: Self-pay

## 2023-02-12 DIAGNOSIS — M5416 Radiculopathy, lumbar region: Secondary | ICD-10-CM | POA: Diagnosis not present

## 2023-02-12 NOTE — Telephone Encounter (Signed)
PT came in to pick up medication from Pt assistance including Jardiance and his insulin @ 2:50 pm

## 2023-02-13 DIAGNOSIS — M5416 Radiculopathy, lumbar region: Secondary | ICD-10-CM | POA: Diagnosis not present

## 2023-02-21 DIAGNOSIS — E1165 Type 2 diabetes mellitus with hyperglycemia: Secondary | ICD-10-CM | POA: Diagnosis not present

## 2023-02-27 ENCOUNTER — Encounter (INDEPENDENT_AMBULATORY_CARE_PROVIDER_SITE_OTHER): Payer: Self-pay

## 2023-03-12 ENCOUNTER — Telehealth: Payer: Self-pay | Admitting: Internal Medicine

## 2023-03-12 NOTE — Telephone Encounter (Signed)
[facility] just called and states that Mr. Reichenback needles and draceba will be delivered to office today.

## 2023-03-12 NOTE — Telephone Encounter (Signed)
We have received. Pt is aware. Pt also stated that he needs some samples of Jardiance.   Connor Ross: 6 boxes Pen Needles: 5 boxes   Medication Samples have been provided to the patient.  Drug name: Jardiance       Strength: 25 mg        Qty: 5 boxes  LOT: [id number]  [id number]  Exp.Date: 11/2024  05/2025  Dosing instructions: Take 1 tablet by mouth daily.   The patient has been instructed regarding the correct time, dose, and frequency of taking this medication, including desired effects and most common side effects.   Connor Ross 12:55 PM 03/12/2023

## 2023-03-19 DIAGNOSIS — M5416 Radiculopathy, lumbar region: Secondary | ICD-10-CM | POA: Diagnosis not present

## 2023-03-24 DIAGNOSIS — E1165 Type 2 diabetes mellitus with hyperglycemia: Secondary | ICD-10-CM | POA: Diagnosis not present

## 2023-03-26 ENCOUNTER — Telehealth: Payer: Self-pay | Admitting: Internal Medicine

## 2023-03-26 DIAGNOSIS — I152 Hypertension secondary to endocrine disorders: Secondary | ICD-10-CM

## 2023-03-26 DIAGNOSIS — M5416 Radiculopathy, lumbar region: Secondary | ICD-10-CM | POA: Diagnosis not present

## 2023-03-26 DIAGNOSIS — M1612 Unilateral primary osteoarthritis, left hip: Secondary | ICD-10-CM | POA: Diagnosis not present

## 2023-03-26 DIAGNOSIS — E785 Hyperlipidemia, unspecified: Secondary | ICD-10-CM

## 2023-03-26 NOTE — Telephone Encounter (Signed)
Patient need lab orders.

## 2023-03-26 NOTE — Telephone Encounter (Signed)
Labs ordered.

## 2023-03-31 ENCOUNTER — Other Ambulatory Visit (INDEPENDENT_AMBULATORY_CARE_PROVIDER_SITE_OTHER): Payer: Medicare HMO

## 2023-03-31 DIAGNOSIS — E1159 Type 2 diabetes mellitus with other circulatory complications: Secondary | ICD-10-CM

## 2023-03-31 DIAGNOSIS — E669 Obesity, unspecified: Secondary | ICD-10-CM

## 2023-03-31 DIAGNOSIS — E785 Hyperlipidemia, unspecified: Secondary | ICD-10-CM | POA: Diagnosis not present

## 2023-03-31 DIAGNOSIS — E1169 Type 2 diabetes mellitus with other specified complication: Secondary | ICD-10-CM | POA: Diagnosis not present

## 2023-03-31 DIAGNOSIS — I152 Hypertension secondary to endocrine disorders: Secondary | ICD-10-CM

## 2023-03-31 LAB — LIPID PANEL
Cholesterol: 127 mg/dL (ref 0–200)
HDL: 34.2 mg/dL — ABNORMAL LOW (ref >39.00)
LDL Cholesterol: 43 mg/dL (ref 0–99)
NonHDL: 92.92
Total CHOL/HDL Ratio: 4
Triglycerides: 251 mg/dL — ABNORMAL HIGH (ref 0.0–149.0)
VLDL: 50.2 mg/dL — ABNORMAL HIGH (ref 0.0–40.0)

## 2023-03-31 LAB — COMPREHENSIVE METABOLIC PANEL
ALT: 23 U/L (ref 0–53)
AST: 12 U/L (ref 0–37)
Albumin: 4.2 g/dL (ref 3.5–5.2)
Alkaline Phosphatase: 59 U/L (ref 39–117)
BUN: 16 mg/dL (ref 6–23)
CO2: 28 meq/L (ref 19–32)
Calcium: 9 mg/dL (ref 8.4–10.5)
Chloride: 102 meq/L (ref 96–112)
Creatinine, Ser: 0.77 mg/dL (ref 0.40–1.50)
GFR: 98.3 mL/min (ref >60.00)
Glucose, Bld: 134 mg/dL — ABNORMAL HIGH (ref 70–99)
Potassium: 4 meq/L (ref 3.5–5.1)
Sodium: 139 meq/L (ref 135–145)
Total Bilirubin: 0.4 mg/dL (ref 0.2–1.2)
Total Protein: 7 g/dL (ref 6.0–8.3)

## 2023-03-31 LAB — HEMOGLOBIN A1C: Hgb A1c MFr Bld: 7.6 % — ABNORMAL HIGH (ref 4.6–6.5)

## 2023-03-31 LAB — LDL CHOLESTEROL, DIRECT: Direct LDL: 61 mg/dL

## 2023-04-01 LAB — HEMOGLOBIN A1C: Hemoglobin A1C: 6.1

## 2023-04-02 ENCOUNTER — Ambulatory Visit (INDEPENDENT_AMBULATORY_CARE_PROVIDER_SITE_OTHER): Payer: Medicare HMO | Admitting: Internal Medicine

## 2023-04-02 ENCOUNTER — Encounter: Payer: Self-pay | Admitting: Internal Medicine

## 2023-04-02 ENCOUNTER — Telehealth: Payer: Self-pay

## 2023-04-02 VITALS — BP 140/76 | HR 88 | Temp 98.3°F | Ht 71.0 in | Wt 317.2 lb

## 2023-04-02 DIAGNOSIS — E119 Type 2 diabetes mellitus without complications: Secondary | ICD-10-CM

## 2023-04-02 DIAGNOSIS — Z794 Long term (current) use of insulin: Secondary | ICD-10-CM

## 2023-04-02 DIAGNOSIS — I152 Hypertension secondary to endocrine disorders: Secondary | ICD-10-CM | POA: Diagnosis not present

## 2023-04-02 DIAGNOSIS — M48061 Spinal stenosis, lumbar region without neurogenic claudication: Secondary | ICD-10-CM | POA: Diagnosis not present

## 2023-04-02 DIAGNOSIS — E785 Hyperlipidemia, unspecified: Secondary | ICD-10-CM | POA: Diagnosis not present

## 2023-04-02 DIAGNOSIS — Z7985 Long-term (current) use of injectable non-insulin antidiabetic drugs: Secondary | ICD-10-CM | POA: Diagnosis not present

## 2023-04-02 DIAGNOSIS — I252 Old myocardial infarction: Secondary | ICD-10-CM

## 2023-04-02 DIAGNOSIS — E1159 Type 2 diabetes mellitus with other circulatory complications: Secondary | ICD-10-CM | POA: Diagnosis not present

## 2023-04-02 DIAGNOSIS — Z6841 Body Mass Index (BMI) 40.0 and over, adult: Secondary | ICD-10-CM

## 2023-04-02 DIAGNOSIS — R600 Localized edema: Secondary | ICD-10-CM

## 2023-04-02 DIAGNOSIS — E1169 Type 2 diabetes mellitus with other specified complication: Secondary | ICD-10-CM

## 2023-04-02 DIAGNOSIS — M5416 Radiculopathy, lumbar region: Secondary | ICD-10-CM

## 2023-04-02 MED ORDER — TRAMADOL HCL 50 MG PO TABS: 50.0000 mg | ORAL_TABLET | Freq: Four times a day (QID) | ORAL | 1 refills | Status: DC | PRN

## 2023-04-02 MED ORDER — METOPROLOL TARTRATE 25 MG PO TABS: 25.0000 mg | ORAL_TABLET | Freq: Two times a day (BID) | ORAL | 1 refills | Status: DC

## 2023-04-02 NOTE — Telephone Encounter (Signed)
Medication Samples have been provided to the patient.  Drug name: Jardiance       Strength: 25 mg        Qty: 3 boxes  LOT: [id number], [id number]  Exp.Date: 05/2025, 02/2025  Dosing instructions: Take 1 tablet by mouth daily.   The patient has been instructed regarding the correct time, dose, and frequency of taking this medication, including desired effects and most common side effects.   Connor Ross 3:52 PM 04/02/2023

## 2023-04-02 NOTE — Progress Notes (Signed)
Subjective:  Patient ID: Connor Ross, male    DOB: 1965/10/17  Age: 57 y.o. MRN: [id number]  CC: The primary encounter diagnosis was Obesity, diabetes, and hypertension syndrome (HCC). Diagnoses of Hyperlipidemia with target LDL less than 70, Spinal stenosis of lumbar region with radiculopathy, Encounter for long-term (current) use of insulin (HCC), Hypertension associated with diabetes (HCC), Long-term current use of injectable noninsulin antidiabetic medication, Lower extremity edema, Morbid obesity with BMI of 45.0-49.9, adult (HCC), and History of non-ST elevation myocardial infarction (NSTEMI) were also pertinent to this visit.   HPI [name] presents for  Chief Complaint  Patient presents with   Medical Management of Chronic Issues    3 month follow up     1) Type 2 DM/obesity/.htn:  [name] states that he  feels generally well,  But is not  exercising  due to chornic back and hip ain th at is under investigation by othopedics.  He is managing his diabets with mealtime and basal insulin as well as GLP1 agonist and SGLT2 inhibitor.  His mealtime insulin doses are done on a sliding scale based om prepreandial levels using a cbg monitor.  He often skips  lunch he only uses it twice daily.   Dinner dose is usually 30 units.  And his basal insulin dose of  tresiba is currently 86 units.  Jardiance 25 mg  .  Ozempic 2 mg  I have downloaded and reviewed the data from patient's continuous blood glucose monitor. Patient's  sugars have been  IN RANGE  77   % OF THE TIME,   BELOW RANGE  1  % of the time.  And ABOVE RANGE 22  % OF THE TIME .   The elevated readings are occurring after dinner   HTN:  taking losartan 100 mg daily   home readings have been 130/80 or less 90% of the time    3) worsening  left hip pain ,  chronic lower back pain : recently had an MRI of lumbar spine  by [facility]. And he has been referred for  [facility] . However patient is ambivalent about the referral because  he feels certain that the pain is coming from his hip joint. Currently he is managing his pain with tramadol and gabapentin   Outpatient Medications Prior to Visit  Medication Sig Dispense Refill   ASPIRIN LOW DOSE 81 MG EC tablet TAKE 1 TABLET BY MOUTH  DAILY (SWALLOW WHOLE) 90 tablet 1   blood glucose meter kit and supplies Dispense based on patient and insurance preference. Use up to four times daily as directed. (FOR ICD-10 E10.9, E11.9). Use to check blood sugar three times daily 1 each 0   clobetasol cream (TEMOVATE) 0.05 % Apply topically 2 (two) times daily.     clopidogrel (PLAVIX) 75 MG tablet Take 75 mg by mouth daily.     Continuous Blood Gluc Sensor (FREESTYLE LIBRE 2 SENSOR) MISC Use to check sugar at least 4 times daily 1 each 2   fluticasone (FLONASE) 50 MCG/ACT nasal spray PLACE 2 SPRAYS INTO BOTH NOSTRILS DAILY. AT NIGHT AFTER NASAL SALINE 16 g 2   furosemide (LASIX) 20 MG tablet TAKE 1 TABLET EVERY OTHER DAY 45 tablet 3   gabapentin (NEURONTIN) 100 MG capsule TAKE 2 CAPSULES THREE TIMES DAILY 540 capsule 3   insulin aspart (NOVOLOG FLEXPEN) 100 UNIT/ML FlexPen Inject up to 25 units with breakfast, 25 units with lunch, and 30 units with supper 72 mL 2  insulin degludec (TRESIBA FLEXTOUCH) 200 UNIT/ML FlexTouch Pen Inject 88 Units into the skin daily. (Patient taking differently: Inject 86 Units into the skin daily.) 48 mL 2   Insulin Pen Needle 32G X 6 MM MISC Use daily with insulin pen 50 each 0   JARDIANCE 25 MG TABS tablet TAKE 1 TABLET EVERY DAY (Patient taking differently: Take 25 mg by mouth daily.) 90 tablet 3   ketoconazole (NIZORAL) 2 % cream SMARTSIG:1 Application Topical 1 to 2 Times Daily     Lancets (FREESTYLE) lancets 1 each by Other route 2 (two) times daily. DX 250.02 100 each 12   losartan (COZAAR) 100 MG tablet TAKE 1 TABLET EVERY DAY 90 tablet 3   Multiple Vitamin (MULTIVITAMIN) tablet Take 1 tablet by mouth daily.     ONETOUCH VERIO test strip USE TO CHECK  BLOOD SUGAR 3  TIMES DAILY 300 strip 3   rosuvastatin (CRESTOR) 40 MG tablet Take 1 tablet (40 mg total) by mouth every evening for 360 doses. 90 tablet 3   Semaglutide, 2 MG/DOSE, (OZEMPIC, 2 MG/DOSE,) 8 MG/3ML SOPN Inject 2 mg into the skin once a week. 8 mL 2   tamsulosin (FLOMAX) 0.4 MG CAPS capsule TAKE 1 CAPSULE EVERY DAY 90 capsule 3   triamcinolone (KENALOG) 0.025 % cream Apply topically.     Upadacitinib ER (RINVOQ) 15 MG TB24 Take 15 mg by mouth daily.     Vitamin D, Ergocalciferol, (DRISDOL) 1.25 MG (50000 UNIT) CAPS capsule Take 50,000 Units by mouth once a week.     traMADol (ULTRAM) 50 MG tablet Take 1 tablet (50 mg total) by mouth every 6 (six) hours as needed. 360 tablet 1   metoprolol tartrate (LOPRESSOR) 25 MG tablet Take 1 tablet (25 mg total) by mouth 2 (two) times daily. 180 tablet 1   No facility-administered medications prior to visit.    Review of Systems;  Patient denies headache, fevers, malaise, unintentional weight loss, skin rash, eye pain, sinus congestion and sinus pain, sore throat, dysphagia,  hemoptysis , cough, dyspnea, wheezing, chest pain, palpitations, orthopnea, edema, abdominal pain, nausea, melena, diarrhea, constipation, flank pain, dysuria, hematuria, urinary  Frequency, nocturia, numbness, tingling, seizures,  Focal weakness, Loss of consciousness,  Tremor, insomnia, depression, anxiety, and suicidal ideation.      Objective:  BP (!) 140/76   Pulse 88   Temp 98.3 F (36.8 C) (Oral)   Ht 5\' 11"  (1.803 m)   Wt (!) 317 lb 3.2 oz (143.9 kg)   SpO2 96%   BMI 44.24 kg/m   BP Readings from Last 3 Encounters:  04/02/23 (!) 140/76  12/31/22 134/68  10/01/22 122/80    Wt Readings from Last 3 Encounters:  04/02/23 (!) 317 lb 3.2 oz (143.9 kg)  01/21/23 (!) 315 lb (142.9 kg)  12/31/22 (!) 315 lb (142.9 kg)    Physical Exam Vitals reviewed.  Constitutional:      General: He is not in acute distress.    Appearance: Normal appearance. He is  obese. He is not ill-appearing, toxic-appearing or diaphoretic.  HENT:     Head: Normocephalic.  Eyes:     General: No scleral icterus.       Right eye: No discharge.        Left eye: No discharge.     Conjunctiva/sclera: Conjunctivae normal.  Cardiovascular:     Rate and Rhythm: Normal rate and regular rhythm.     Heart sounds: Normal heart sounds.  Pulmonary:  Effort: Pulmonary effort is normal. No respiratory distress.     Breath sounds: Normal breath sounds.  Musculoskeletal:        General: Normal range of motion.     Cervical back: Normal range of motion.  Skin:    General: Skin is warm and dry.  Neurological:     General: No focal deficit present.     Mental Status: He is alert and oriented to person, place, and time. Mental status is at baseline.  Psychiatric:        Mood and Affect: Mood normal.        Behavior: Behavior normal.        Thought Content: Thought content normal.        Judgment: Judgment normal.     Lab Results  Component Value Date   HGBA1C 6.1 04/01/2023   HGBA1C 7.6 (H) 03/31/2023   HGBA1C 7.6 (H) 12/27/2022    Lab Results  Component Value Date   CREATININE 0.77 03/31/2023   CREATININE 0.77 12/27/2022   CREATININE 0.66 09/27/2022    Lab Results  Component Value Date   WBC 8.8 09/27/2022   HGB 14.9 09/27/2022   HCT 44.1 09/27/2022   PLT 257.0 09/27/2022   GLUCOSE 134 (H) 03/31/2023   CHOL 127 03/31/2023   TRIG 251.0 (H) 03/31/2023   HDL 34.20 (L) 03/31/2023   LDLDIRECT 61.0 03/31/2023   LDLCALC 43 03/31/2023   ALT 23 03/31/2023   AST 12 03/31/2023   NA 139 03/31/2023   K 4.0 03/31/2023   CL 102 03/31/2023   CREATININE 0.77 03/31/2023   BUN 16 03/31/2023   CO2 28 03/31/2023   TSH 2.29 03/27/2022   PSA 0.47 03/27/2022   INR 1.0 06/24/2022   HGBA1C 6.1 04/01/2023   MICROALBUR 3.0 (H) 12/27/2022    DG Lumbar Spine Complete  Result Date: 09/21/2022 CLINICAL DATA:  Stabbing left-sided back pain EXAM: LUMBAR SPINE - COMPLETE  4+ VIEW COMPARISON:  01/16/2013 FINDINGS: Minimal scoliotic curvature. Disc space narrowing throughout the lower lumbar region. Ordinary lower lumbar facet osteoarthritis. Mild sacroiliac osteoarthritis. No fracture or focal lesion. IMPRESSION: No acute or traumatic finding. Minimal scoliotic curvature. 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 Mild sacroiliac osteoarthritis. Electronically Signed   By: Paulina Fusi M.D.   On: 09/21/2022 13:32   DG Abd 2 Views  Result Date: 09/19/2022 CLINICAL DATA:  Left CVA stabbing pain.  Evaluate for stones. EXAM: ABDOMEN - 2 VIEW COMPARISON:  January 05, 2020 KUB FINDINGS: Lung bases are normal. No free air, portal venous gas, or pneumatosis. Both kidneys are partially obscured by bowel contents but no renal stones are noted. Calcifications in the pelvis are likely phleboliths. Moderate fecal loading throughout the colon.  No bowel obstruction. Degenerative changes throughout the lumbar spine and in the hips. IMPRESSION: 1. No renal stones identified. 2. Moderate fecal loading throughout the colon. 3. Degenerative changes in the spine and hips. Electronically Signed   By: Gerome Sam III M.D.   On: 09/19/2022 11:03    Assessment & Plan:  .Obesity, diabetes, and hypertension syndrome (HCC) Assessment & Plan: Improved control based on September  A1c derived  from using conversion of  Fructosamine level suggesting an  A1c of 6.1,   I have downloaded and reviewed the data from patient's continuous blood glucose monitor.  Medication changes were made based on this review as follows: continue current regimen,   Lab Results  Component Value Date   HGBA1C 6.1 04/01/2023  Orders: -     Comprehensive metabolic panel; Future -     Hemoglobin A1c; Future -     Fructosamine; Future  Hyperlipidemia with target LDL less than 70 -     Lipid panel; Future -     LDL cholesterol, direct; Future  Spinal stenosis of lumbar region with  radiculopathy Assessment & Plan: Reviewed orthopedics most recent OV from August 2024: "Mr. Penning has radiation of symptoms from his low back into his medial knee which extends down the anterior thigh. He has severe degenerative disc disease between the L4-5 with significant neuroforaminal narrowing in the lower levels of the lumbar spine, most evident at L4-5. I am concerned that his symptoms may be more related to lumbar radiculopathy than they are his left hip osteoarthritis. " MRI of lumbar spine was done at outside facility.  He has been referred presumedly for ESI   Orders: -     traMADol HCl; Take 1 tablet (50 mg total) by mouth every 6 (six) hours as needed.  Dispense: 360 tablet; Refill: 1  Encounter for long-term (current) use of insulin (HCC) Assessment & Plan: Current doses of Tresiba and Novolog reviewed and no changes were advised because review of his CBG monitor notes several hours of borderline hypoglycemia daily    Hypertension associated with diabetes (HCC) Assessment & Plan: Well controlled on current regimen of amlodipine losartan, metoprolol.  no changes today.  Lab Results  Component Value Date   CREATININE 0.77 03/31/2023   Lab Results  Component Value Date   NA 139 03/31/2023   K 4.0 03/31/2023   CL 102 03/31/2023   CO2 28 03/31/2023     Long-term current use of injectable noninsulin antidiabetic medication Assessment & Plan: He is taking 0zempic  2 mg weekly.  Weight has started to increase again due to lack of exercise.    Lower extremity edema Assessment & Plan: Managed with use of furosemide every other day    Morbid obesity with BMI of 45.0-49.9, adult (HCC) Assessment & Plan: He has gained weight while taking Ozempic due to recurrent hypoglycemia resulting in  Increased sugar intake .  Reviewed dietary choices and need for some form of exercise daily    History of non-ST elevation myocardial infarction (NSTEMI) Assessment & Plan: S/p  quadruple stenting, with PTCA/ overlapping stents of LAD on Jun 25 2022  , followed by PTCA/stent of RCA on Dec 13.   He completed a course of Brilinta and has not had recurrence of chest pain.   LDL is <70 on  rosuvastatin 40 mg,  and he is tolerating current therapy . No changes today  Lab Results  Component Value Date   CHOL 127 03/31/2023   HDL 34.20 (L) 03/31/2023   LDLCALC 43 03/31/2023   LDLDIRECT 61.0 03/31/2023   TRIG 251.0 (H) 03/31/2023   CHOLHDL 4 03/31/2023      Other orders -     Metoprolol Tartrate; Take 1 tablet (25 mg total) by mouth 2 (two) times daily.  Dispense: 180 tablet; Refill: 1     In addition to time spent reviewing the C BG monitor data,  I  provided 30 minutes of face-to-face time during this encounter reviewing patient's last visit with me, patient's  most recent visit with cardiology,  orthopedics,   recent surgical and non surgical procedures, previous  labs and imaging studies, counseling on currently addressed issues,  and post visit ordering to diagnostics and therapeutics .   Follow-up: Return  in about 3 months (around 07/02/2023) for follow up diabetes.   Sherlene Shams, MD

## 2023-04-02 NOTE — Patient Instructions (Signed)
No changes to regimen today because you are having lows   Return in 3 months ;  labs on or after dec 16   Tramadol  refilled

## 2023-04-04 DIAGNOSIS — M1612 Unilateral primary osteoarthritis, left hip: Secondary | ICD-10-CM | POA: Diagnosis not present

## 2023-04-04 NOTE — Assessment & Plan Note (Signed)
Reviewed orthopedics most recent OV from August 2024: "Connor Ross has radiation of symptoms from his low back into his medial knee which extends down the anterior thigh. He has severe degenerative disc disease between the L4-5 with significant neuroforaminal narrowing in the lower levels of the lumbar spine, most evident at L4-5. I am concerned that his symptoms may be more related to lumbar radiculopathy than they are his left hip osteoarthritis. " MRI of lumbar spine was done at outside facility.  He has been referred presumedly for [facility]

## 2023-04-04 NOTE — Assessment & Plan Note (Addendum)
S/p quadruple stenting, with PTCA/ overlapping stents of LAD on Jun 25 2022  , followed by PTCA/stent of RCA on Dec 13.   He completed a course of Brilinta and has not had recurrence of chest pain.   LDL is <70 on  rosuvastatin 40 mg,  and he is tolerating current therapy . No changes today  Lab Results  Component Value Date   CHOL 127 03/31/2023   HDL 34.20 (L) 03/31/2023   LDLCALC 43 03/31/2023   LDLDIRECT 61.0 03/31/2023   TRIG 251.0 (H) 03/31/2023   CHOLHDL 4 03/31/2023

## 2023-04-04 NOTE — Assessment & Plan Note (Addendum)
Improved control based on September  A1c derived  from using conversion of  Fructosamine level suggesting an  A1c of 6.1,   I have downloaded and reviewed the data from patient's continuous blood glucose monitor.  Medication changes were made based on this review as follows: continue current regimen,  current A1c is 5.9 using fructosamine conversion.    Lab Results  Component Value Date   HGBA1C 6.1 04/01/2023

## 2023-04-04 NOTE — Assessment & Plan Note (Addendum)
Current doses of Tresiba and Novolog reviewed and no changes were advised because review of his CBG monitor notes several hours of borderline hypoglycemia daily

## 2023-04-04 NOTE — Assessment & Plan Note (Signed)
Well controlled on current regimen of amlodipine losartan, metoprolol.  no changes today.  Lab Results  Component Value Date   CREATININE 0.77 03/31/2023   Lab Results  Component Value Date   NA 139 03/31/2023   K 4.0 03/31/2023   CL 102 03/31/2023   CO2 28 03/31/2023

## 2023-04-04 NOTE — Assessment & Plan Note (Signed)
Managed with use of furosemide every other day

## 2023-04-04 NOTE — Assessment & Plan Note (Signed)
He has gained weight while taking Ozempic due to recurrent hypoglycemia resulting in  Increased sugar intake .  Reviewed dietary choices and need for some form of exercise daily

## 2023-04-04 NOTE — Assessment & Plan Note (Signed)
He is taking 0zempic  2 mg weekly.  Weight has started to increase again due to lack of exercise.

## 2023-04-21 DIAGNOSIS — Z79899 Other long term (current) drug therapy: Secondary | ICD-10-CM | POA: Diagnosis not present

## 2023-04-21 DIAGNOSIS — L2089 Other atopic dermatitis: Secondary | ICD-10-CM | POA: Diagnosis not present

## 2023-04-24 DIAGNOSIS — E1165 Type 2 diabetes mellitus with hyperglycemia: Secondary | ICD-10-CM | POA: Diagnosis not present

## 2023-05-25 DIAGNOSIS — E1165 Type 2 diabetes mellitus with hyperglycemia: Secondary | ICD-10-CM | POA: Diagnosis not present

## 2023-05-30 ENCOUNTER — Telehealth: Payer: Self-pay

## 2023-05-30 NOTE — Telephone Encounter (Signed)
Reaching out to pt via mychart regarding 2025 re enrollment for Novolog, Tresiba, Pen needles, and Ozempic through [employer] and [place] through [place].

## 2023-06-10 NOTE — Telephone Encounter (Signed)
Submitted on line PAP  application 2025 per Patient's verbal consent. PCP portion faxed to office

## 2023-06-22 ENCOUNTER — Encounter: Payer: Self-pay | Admitting: Internal Medicine

## 2023-06-23 NOTE — Telephone Encounter (Signed)
Pt came into the office to drop off patient assistance application. Placed in provider folder

## 2023-06-24 ENCOUNTER — Encounter: Payer: Self-pay | Admitting: Pharmacist

## 2023-06-24 NOTE — Telephone Encounter (Signed)
Received and given to Berenice Primas, [name].

## 2023-06-24 NOTE — Progress Notes (Addendum)
Adult nurse Program (MAP) Application   Manufacturer: Novo Nordisk Medication(s): Evaristo Bury U200 Novolog, Ozempic +pen needles Patient Portion of Application: Completed via online enrollment tool with assistance from [facility] Medication Advocate team. Provider Portion of Application: Provider pages received from [facility] Medication Advocate Team via FAX.    Next Steps:  Upon PCP signature of forms: Fax to  [facility] CPhT Medication Advocate Team: [phone]   12/27:  PAP: Patient assistance application for Novolog, Ozempic, and Evaristo Bury has been approved by PAP Companies: NovoNordisk from 07/16/2023 to 07/14/2024. Pt ID is: [id number].

## 2023-06-25 DIAGNOSIS — E1165 Type 2 diabetes mellitus with hyperglycemia: Secondary | ICD-10-CM | POA: Diagnosis not present

## 2023-06-25 NOTE — Progress Notes (Signed)
Form has been signed and faxed to [facility] CPhT Medication Advocate Team. Placed in urgent scan folder to be scanned to pt's chart.

## 2023-06-26 NOTE — Telephone Encounter (Signed)
Provider portion sent to [employer] with [employer].

## 2023-07-02 ENCOUNTER — Other Ambulatory Visit (INDEPENDENT_AMBULATORY_CARE_PROVIDER_SITE_OTHER): Payer: Medicare HMO

## 2023-07-02 DIAGNOSIS — E1169 Type 2 diabetes mellitus with other specified complication: Secondary | ICD-10-CM | POA: Diagnosis not present

## 2023-07-02 DIAGNOSIS — Z7985 Long-term (current) use of injectable non-insulin antidiabetic drugs: Secondary | ICD-10-CM | POA: Diagnosis not present

## 2023-07-02 DIAGNOSIS — E1159 Type 2 diabetes mellitus with other circulatory complications: Secondary | ICD-10-CM | POA: Diagnosis not present

## 2023-07-02 DIAGNOSIS — Z794 Long term (current) use of insulin: Secondary | ICD-10-CM

## 2023-07-02 DIAGNOSIS — E785 Hyperlipidemia, unspecified: Secondary | ICD-10-CM

## 2023-07-02 DIAGNOSIS — E669 Obesity, unspecified: Secondary | ICD-10-CM | POA: Diagnosis not present

## 2023-07-02 DIAGNOSIS — I152 Hypertension secondary to endocrine disorders: Secondary | ICD-10-CM

## 2023-07-02 LAB — COMPREHENSIVE METABOLIC PANEL
ALT: 27 U/L (ref 0–53)
AST: 17 U/L (ref 0–37)
Albumin: 4.2 g/dL (ref 3.5–5.2)
Alkaline Phosphatase: 59 U/L (ref 39–117)
BUN: 11 mg/dL (ref 6–23)
CO2: 27 meq/L (ref 19–32)
Calcium: 8.6 mg/dL (ref 8.4–10.5)
Chloride: 102 meq/L (ref 96–112)
Creatinine, Ser: 0.64 mg/dL (ref 0.40–1.50)
GFR: 103.76 mL/min (ref >60.00)
Glucose, Bld: 121 mg/dL — ABNORMAL HIGH (ref 70–99)
Potassium: 3.5 meq/L (ref 3.5–5.1)
Sodium: 137 meq/L (ref 135–145)
Total Bilirubin: 0.4 mg/dL (ref 0.2–1.2)
Total Protein: 6.8 g/dL (ref 6.0–8.3)

## 2023-07-02 LAB — LIPID PANEL
Cholesterol: 101 mg/dL (ref 0–200)
HDL: 28.5 mg/dL — ABNORMAL LOW (ref >39.00)
LDL Cholesterol: 30 mg/dL (ref 0–99)
NonHDL: 72.1
Total CHOL/HDL Ratio: 4
Triglycerides: 209 mg/dL — ABNORMAL HIGH (ref 0.0–149.0)
VLDL: 41.8 mg/dL — ABNORMAL HIGH (ref 0.0–40.0)

## 2023-07-02 LAB — HEMOGLOBIN A1C: Hgb A1c MFr Bld: 7.9 % — ABNORMAL HIGH (ref 4.6–6.5)

## 2023-07-02 LAB — LDL CHOLESTEROL, DIRECT: Direct LDL: 48 mg/dL

## 2023-07-04 ENCOUNTER — Ambulatory Visit: Payer: Medicare HMO | Admitting: Internal Medicine

## 2023-07-04 ENCOUNTER — Encounter: Payer: Self-pay | Admitting: Internal Medicine

## 2023-07-04 ENCOUNTER — Telehealth: Payer: Self-pay

## 2023-07-04 VITALS — BP 152/70 | HR 80 | Ht 71.0 in | Wt 304.0 lb

## 2023-07-04 DIAGNOSIS — E1159 Type 2 diabetes mellitus with other circulatory complications: Secondary | ICD-10-CM

## 2023-07-04 DIAGNOSIS — Z125 Encounter for screening for malignant neoplasm of prostate: Secondary | ICD-10-CM

## 2023-07-04 DIAGNOSIS — I152 Hypertension secondary to endocrine disorders: Secondary | ICD-10-CM | POA: Diagnosis not present

## 2023-07-04 DIAGNOSIS — E1169 Type 2 diabetes mellitus with other specified complication: Secondary | ICD-10-CM

## 2023-07-04 DIAGNOSIS — Z7985 Long-term (current) use of injectable non-insulin antidiabetic drugs: Secondary | ICD-10-CM

## 2023-07-04 DIAGNOSIS — E669 Obesity, unspecified: Secondary | ICD-10-CM

## 2023-07-04 DIAGNOSIS — E785 Hyperlipidemia, unspecified: Secondary | ICD-10-CM

## 2023-07-04 LAB — HEMOGLOBIN A1C: Hemoglobin A1C: 5.9

## 2023-07-04 MED ORDER — ROSUVASTATIN CALCIUM 40 MG PO TABS: 40.0000 mg | ORAL_TABLET | Freq: Every evening | ORAL | 3 refills | Status: DC

## 2023-07-04 MED ORDER — METOPROLOL TARTRATE 50 MG PO TABS: 50.0000 mg | ORAL_TABLET | Freq: Two times a day (BID) | ORAL | 1 refills | Status: DC

## 2023-07-04 NOTE — Assessment & Plan Note (Signed)
Elevated today on . Not taking amlodipine  but taking losartan, metoprolol.  Lab Results  Component Value Date   CREATININE 0.64 07/02/2023   Lab Results  Component Value Date   NA 137 07/02/2023   K 3.5 07/02/2023   CL 102 07/02/2023   CO2 27 07/02/2023

## 2023-07-04 NOTE — Telephone Encounter (Signed)
Medication Samples have been provided to the patient.  Drug name: Jardiance     Strength: 25 mg        Qty: 3 boxes  LOT: [id number]  Exp.Date: 07/2025  Dosing instructions: Take 1 tablet by mouth daily.   The patient has been instructed regarding the correct time, dose, and frequency of taking this medication, including desired effects and most common side effects.   Mazi Brailsford 3:01 PM 07/04/2023

## 2023-07-04 NOTE — Patient Instructions (Addendum)
Your blood sugars are dropping too low because of your new diet.  I recommend that you reduce your Tresiba dose from 88 units  to 80 units and continue using  reduced doses of Novolog "prn" as you are doing    Your blood pressure medications are losartan and metoprolol.  I would like you to increase the metoprolol to 50 mg twice daily to lower your blood pressure   The CDC now recommends that all adults get vaccinated against Hepatitis B as soon as possible.    Hepatitis B infection can cause liver failure  and other chronic and life threatening conditions and is contagious (can be spread by blood products and body fluids) .  This can be done here (if you have private insurance) or at your pharmacy (if you have Medicare/Medicaid) It requires a 2nd dose after one month    May the Lord give you peace and joy during this holiday season!   Regards,   Duncan Dull, MD

## 2023-07-04 NOTE — Assessment & Plan Note (Addendum)
Improved control based on September  A1c derived  from using conversion of  Fructosamine level suggesting an  A1c of 6.1,   repeat level is PENDING,   I have downloaded and reviewed the data from patient's continuous blood glucose monitor and have advised him to reudce is [place] to 80 units due to frequent prolonged periods of hypoglycemia .  The anticipated A1c I s 6.4

## 2023-07-04 NOTE — Progress Notes (Unsigned)
Subjective:  Patient ID: Connor Ross, male    DOB: 16-Jan-1966  Age: 57 y.o. MRN: [id number]  CC: The primary encounter diagnosis was Obesity, diabetes, and hypertension syndrome (HCC). Diagnoses of Long-term current use of injectable noninsulin antidiabetic medication, Hyperlipidemia with target LDL less than 70, and Screening for prostate cancer were also pertinent to this visit.   HPI Connor Ross presents for  Chief Complaint  Patient presents with   Medical Management of Chronic Issues    3 month follow up on diabetes    1) Type 2 DM:  Connor Ross  has been  taking ozempic 2 mg dose Tresiba 88 units,  novolog  tid  with reduced doses or omitted doses  !depending on his food intake), jardiance and  he has been  losing weight intentionally  with portion reduction 13 lbs since last visit , only eating one meals daily and snacking on KIND low GI bars .  He usually  Usually skips lunch a, has  liht breakfast if at llll  and regular dinner.  I have downloaded and reviewed the data from patient's continuous blood glucose monitor  for the period Dec 7 to Dec 20 .   Patient's  sugars have been  IN RANGE  81   % OF THE TIME,   BELOW RANGE  4  % of the time.  And ABOVE RANGE  15 % OF THE TIME .  Medication changes were made based on this review as follows:   2) HTN:  he is taking losartan 100 mg and metoprolol  25 mg bid  he states tht Dr Reina Fuse stopped his amlodipine in July.  He is not checking his BP at home but thinks his BP rises when he is too active and his pain increases  3) Hip pain:  he is losing weight intentionally in anticipation of having a hip replacement by [facility]   4)   Outpatient Medications Prior to Visit  Medication Sig Dispense Refill   ASPIRIN LOW DOSE 81 MG EC tablet TAKE 1 TABLET BY MOUTH  DAILY (SWALLOW WHOLE) 90 tablet 1   blood glucose meter kit and supplies Dispense based on patient and insurance preference. Use up to four times daily as directed.  (FOR ICD-10 E10.9, E11.9). Use to check blood sugar three times daily 1 each 0   clobetasol cream (TEMOVATE) 0.05 % Apply topically 2 (two) times daily.     clopidogrel (PLAVIX) 75 MG tablet Take 75 mg by mouth daily.     Continuous Blood Gluc Sensor (FREESTYLE LIBRE 2 SENSOR) MISC Use to check sugar at least 4 times daily 1 each 2   fluticasone (FLONASE) 50 MCG/ACT nasal spray PLACE 2 SPRAYS INTO BOTH NOSTRILS DAILY. AT NIGHT AFTER NASAL SALINE 16 g 2   furosemide (LASIX) 20 MG tablet TAKE 1 TABLET EVERY OTHER DAY 45 tablet 3   gabapentin (NEURONTIN) 100 MG capsule TAKE 2 CAPSULES THREE TIMES DAILY 540 capsule 3   insulin aspart (NOVOLOG FLEXPEN) 100 UNIT/ML FlexPen Inject up to 25 units with breakfast, 25 units with lunch, and 30 units with supper 72 mL 2   insulin degludec (TRESIBA FLEXTOUCH) 200 UNIT/ML FlexTouch Pen Inject 88 Units into the skin daily. (Patient taking differently: Inject 86 Units into the skin daily.) 48 mL 2   Insulin Pen Needle 32G X 6 MM MISC Use daily with insulin pen 50 each 0   JARDIANCE 25 MG TABS tablet TAKE 1 TABLET EVERY DAY (Patient  taking differently: Take 25 mg by mouth daily.) 90 tablet 3   ketoconazole (NIZORAL) 2 % cream SMARTSIG:1 Application Topical 1 to 2 Times Daily     Lancets (FREESTYLE) lancets 1 each by Other route 2 (two) times daily. DX 250.02 100 each 12   losartan (COZAAR) 100 MG tablet TAKE 1 TABLET EVERY DAY 90 tablet 3   metoprolol tartrate (LOPRESSOR) 25 MG tablet Take 1 tablet (25 mg total) by mouth 2 (two) times daily. 180 tablet 1   Multiple Vitamin (MULTIVITAMIN) tablet Take 1 tablet by mouth daily.     ONETOUCH VERIO test strip USE TO CHECK BLOOD SUGAR 3  TIMES DAILY 300 strip 3   rosuvastatin (CRESTOR) 40 MG tablet Take 1 tablet (40 mg total) by mouth every evening for 360 doses. 90 tablet 3   Semaglutide, 2 MG/DOSE, (OZEMPIC, 2 MG/DOSE,) 8 MG/3ML SOPN Inject 2 mg into the skin once a week. 8 mL 2   tamsulosin (FLOMAX) 0.4 MG CAPS capsule  TAKE 1 CAPSULE EVERY DAY 90 capsule 3   traMADol (ULTRAM) 50 MG tablet Take 1 tablet (50 mg total) by mouth every 6 (six) hours as needed. 360 tablet 1   triamcinolone (KENALOG) 0.025 % cream Apply topically.     Upadacitinib ER (RINVOQ) 15 MG TB24 Take 15 mg by mouth daily.     Vitamin D, Ergocalciferol, (DRISDOL) 1.25 MG (50000 UNIT) CAPS capsule Take 50,000 Units by mouth once a week.     No facility-administered medications prior to visit.    Review of Systems;  Patient denies headache, fevers, malaise, unintentional weight loss, skin rash, eye pain, sinus congestion and sinus pain, sore throat, dysphagia,  hemoptysis , cough, dyspnea, wheezing, chest pain, palpitations, orthopnea, edema, abdominal pain, nausea, melena, diarrhea, constipation, flank pain, dysuria, hematuria, urinary  Frequency, nocturia, numbness, tingling, seizures,  Focal weakness, Loss of consciousness,  Tremor, insomnia, depression, anxiety, and suicidal ideation.      Objective:  BP (!) 152/70   Pulse 80   Ht 5\' 11"  (1.803 m)   Wt (!) 304 lb (137.9 kg)   SpO2 97%   BMI 42.40 kg/m   BP Readings from Last 3 Encounters:  07/04/23 (!) 152/70  04/02/23 (!) 140/76  12/31/22 134/68    Wt Readings from Last 3 Encounters:  07/04/23 (!) 304 lb (137.9 kg)  04/02/23 (!) 317 lb 3.2 oz (143.9 kg)  01/21/23 (!) 315 lb (142.9 kg)    Physical Exam  Lab Results  Component Value Date   HGBA1C 7.9 (H) 07/02/2023   HGBA1C 6.1 04/01/2023   HGBA1C 7.6 (H) 03/31/2023    Lab Results  Component Value Date   CREATININE 0.64 07/02/2023   CREATININE 0.77 03/31/2023   CREATININE 0.77 12/27/2022    Lab Results  Component Value Date   WBC 8.8 09/27/2022   HGB 14.9 09/27/2022   HCT 44.1 09/27/2022   PLT 257.0 09/27/2022   GLUCOSE 121 (H) 07/02/2023   CHOL 101 07/02/2023   TRIG 209.0 (H) 07/02/2023   HDL 28.50 (L) 07/02/2023   LDLDIRECT 48.0 07/02/2023   LDLCALC 30 07/02/2023   ALT 27 07/02/2023   AST 17  07/02/2023   NA 137 07/02/2023   K 3.5 07/02/2023   CL 102 07/02/2023   CREATININE 0.64 07/02/2023   BUN 11 07/02/2023   CO2 27 07/02/2023   TSH 2.29 03/27/2022   PSA 0.47 03/27/2022   INR 1.0 06/24/2022   HGBA1C 7.9 (H) 07/02/2023   MICROALBUR 3.0 (  H) 12/27/2022    DG Lumbar Spine Complete Result Date: 09/21/2022 CLINICAL DATA:  Stabbing left-sided back pain EXAM: LUMBAR SPINE - COMPLETE 4+ VIEW COMPARISON:  01/16/2013 FINDINGS: Minimal scoliotic curvature. Disc space narrowing throughout the lower lumbar region. Ordinary lower lumbar facet osteoarthritis. Mild sacroiliac osteoarthritis. No fracture or focal lesion. IMPRESSION: No acute or traumatic finding. Minimal scoliotic curvature. 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 Mild sacroiliac osteoarthritis. Electronically Signed   By: Paulina Fusi M.D.   On: 09/21/2022 13:32   DG Abd 2 Views Result Date: 09/19/2022 CLINICAL DATA:  Left CVA stabbing pain.  Evaluate for stones. EXAM: ABDOMEN - 2 VIEW COMPARISON:  January 05, 2020 KUB FINDINGS: Lung bases are normal. No free air, portal venous gas, or pneumatosis. Both kidneys are partially obscured by bowel contents but no renal stones are noted. Calcifications in the pelvis are likely phleboliths. Moderate fecal loading throughout the colon.  No bowel obstruction. Degenerative changes throughout the lumbar spine and in the hips. IMPRESSION: 1. No renal stones identified. 2. Moderate fecal loading throughout the colon. 3. Degenerative changes in the spine and hips. Electronically Signed   By: Gerome Sam III M.D.   On: 09/19/2022 11:03    Assessment & Plan:  .Obesity, diabetes, and hypertension syndrome (HCC)  Long-term current use of injectable noninsulin antidiabetic medication  Hyperlipidemia with target LDL less than 70  Screening for prostate cancer     I provided 30 minutes of face-to-face time during this encounter reviewing patient's last visit  with me, patient's  most recent visit with cardiology,  nephrology,  and neurology,  recent surgical and non surgical procedures, previous  labs and imaging studies, counseling on currently addressed issues,  and post visit ordering to diagnostics and therapeutics .   Follow-up: No follow-ups on file.   Sherlene Shams, MD

## 2023-07-08 LAB — FRUCTOSAMINE: Fructosamine: 253 umol/L (ref 205–285)

## 2023-07-11 ENCOUNTER — Other Ambulatory Visit: Payer: Self-pay | Admitting: Internal Medicine

## 2023-07-11 DIAGNOSIS — I152 Hypertension secondary to endocrine disorders: Secondary | ICD-10-CM

## 2023-07-11 NOTE — Telephone Encounter (Signed)
PAP: Patient assistance application for Novolog, Ozempic, and Evaristo Bury has been approved by [employer]: [employer] from 07/16/2023 to 07/14/2024. Medication should be delivered to PAP Delivery: Provider's office For further shipping updates, please contact [employer] at [phone] Pt ID is: [id number]

## 2023-07-17 ENCOUNTER — Encounter: Payer: Self-pay | Admitting: Internal Medicine

## 2023-07-17 DIAGNOSIS — E1165 Type 2 diabetes mellitus with hyperglycemia: Secondary | ICD-10-CM

## 2023-07-17 MED ORDER — EMPAGLIFLOZIN 25 MG PO TABS: 25.0000 mg | ORAL_TABLET | Freq: Every day | ORAL | 3 refills | Status: DC

## 2023-07-26 DIAGNOSIS — E1165 Type 2 diabetes mellitus with hyperglycemia: Secondary | ICD-10-CM | POA: Diagnosis not present

## 2023-08-11 DIAGNOSIS — I10 Essential (primary) hypertension: Secondary | ICD-10-CM | POA: Diagnosis not present

## 2023-08-11 DIAGNOSIS — E113293 Type 2 diabetes mellitus with mild nonproliferative diabetic retinopathy without macular edema, bilateral: Secondary | ICD-10-CM | POA: Diagnosis not present

## 2023-08-11 LAB — HM DIABETES EYE EXAM

## 2023-08-12 ENCOUNTER — Telehealth: Payer: Self-pay | Admitting: Internal Medicine

## 2023-08-12 ENCOUNTER — Encounter: Payer: Self-pay | Admitting: Internal Medicine

## 2023-08-12 DIAGNOSIS — E08319 Diabetes mellitus due to underlying condition with unspecified diabetic retinopathy without macular edema: Secondary | ICD-10-CM

## 2023-08-12 NOTE — Telephone Encounter (Signed)
Patient dropped off Handicap placard to be filled out by provider. Please call patient when form is ready for pick up.

## 2023-08-12 NOTE — Telephone Encounter (Signed)
Placed in quick sign folder for signature.

## 2023-08-13 ENCOUNTER — Other Ambulatory Visit: Payer: Self-pay | Admitting: Internal Medicine

## 2023-08-13 DIAGNOSIS — I152 Hypertension secondary to endocrine disorders: Secondary | ICD-10-CM

## 2023-08-13 NOTE — Telephone Encounter (Signed)
Signed, completed and placed up front for pick up. Pt stated that his wife will pick up today when she comes in for her appt.

## 2023-08-14 DIAGNOSIS — E782 Mixed hyperlipidemia: Secondary | ICD-10-CM | POA: Diagnosis not present

## 2023-08-14 DIAGNOSIS — I252 Old myocardial infarction: Secondary | ICD-10-CM | POA: Diagnosis not present

## 2023-08-14 DIAGNOSIS — E1169 Type 2 diabetes mellitus with other specified complication: Secondary | ICD-10-CM | POA: Diagnosis not present

## 2023-08-14 DIAGNOSIS — R600 Localized edema: Secondary | ICD-10-CM | POA: Diagnosis not present

## 2023-08-14 DIAGNOSIS — I10 Essential (primary) hypertension: Secondary | ICD-10-CM | POA: Diagnosis not present

## 2023-08-14 DIAGNOSIS — E11319 Type 2 diabetes mellitus with unspecified diabetic retinopathy without macular edema: Secondary | ICD-10-CM | POA: Insufficient documentation

## 2023-08-14 DIAGNOSIS — E785 Hyperlipidemia, unspecified: Secondary | ICD-10-CM | POA: Diagnosis not present

## 2023-08-14 DIAGNOSIS — I2510 Atherosclerotic heart disease of native coronary artery without angina pectoris: Secondary | ICD-10-CM | POA: Diagnosis not present

## 2023-08-25 ENCOUNTER — Inpatient Hospital Stay
Admission: EM | Admit: 2023-08-25 | Discharge: 2023-09-02 | DRG: 871 | Disposition: A | Discharge status: Other Acute Inpatient Hospital | Payer: Medicare HMO | Attending: Student | Admitting: Student

## 2023-08-25 ENCOUNTER — Emergency Department: Payer: Medicare HMO

## 2023-08-25 ENCOUNTER — Encounter: Payer: Self-pay | Admitting: Family Medicine

## 2023-08-25 ENCOUNTER — Ambulatory Visit: Payer: Medicare HMO | Admitting: Family Medicine

## 2023-08-25 VITALS — BP 120/66 | HR 90 | Temp 100.2°F | Ht 71.0 in | Wt 290.2 lb

## 2023-08-25 DIAGNOSIS — A419 Sepsis, unspecified organism: Secondary | ICD-10-CM

## 2023-08-25 DIAGNOSIS — I10 Essential (primary) hypertension: Secondary | ICD-10-CM | POA: Insufficient documentation

## 2023-08-25 DIAGNOSIS — R197 Diarrhea, unspecified: Secondary | ICD-10-CM | POA: Diagnosis present

## 2023-08-25 DIAGNOSIS — M7989 Other specified soft tissue disorders: Secondary | ICD-10-CM | POA: Diagnosis not present

## 2023-08-25 DIAGNOSIS — Z79899 Other long term (current) drug therapy: Secondary | ICD-10-CM

## 2023-08-25 DIAGNOSIS — Z825 Family history of asthma and other chronic lower respiratory diseases: Secondary | ICD-10-CM

## 2023-08-25 DIAGNOSIS — Z1152 Encounter for screening for COVID-19: Secondary | ICD-10-CM

## 2023-08-25 DIAGNOSIS — A401 Sepsis due to streptococcus, group B: Principal | ICD-10-CM

## 2023-08-25 DIAGNOSIS — R410 Disorientation, unspecified: Secondary | ICD-10-CM

## 2023-08-25 DIAGNOSIS — L0390 Cellulitis, unspecified: Secondary | ICD-10-CM

## 2023-08-25 DIAGNOSIS — E785 Hyperlipidemia, unspecified: Secondary | ICD-10-CM | POA: Diagnosis present

## 2023-08-25 DIAGNOSIS — I700 Atherosclerosis of aorta: Secondary | ICD-10-CM | POA: Diagnosis present

## 2023-08-25 DIAGNOSIS — A4181 Sepsis due to Enterococcus: Secondary | ICD-10-CM | POA: Diagnosis not present

## 2023-08-25 DIAGNOSIS — Z833 Family history of diabetes mellitus: Secondary | ICD-10-CM

## 2023-08-25 DIAGNOSIS — R3589 Other polyuria: Secondary | ICD-10-CM | POA: Diagnosis not present

## 2023-08-25 DIAGNOSIS — R509 Fever, unspecified: Secondary | ICD-10-CM | POA: Diagnosis not present

## 2023-08-25 DIAGNOSIS — E871 Hypo-osmolality and hyponatremia: Secondary | ICD-10-CM | POA: Diagnosis present

## 2023-08-25 DIAGNOSIS — J1008 Influenza due to other identified influenza virus with other specified pneumonia: Secondary | ICD-10-CM | POA: Diagnosis present

## 2023-08-25 DIAGNOSIS — R0989 Other specified symptoms and signs involving the circulatory and respiratory systems: Secondary | ICD-10-CM | POA: Diagnosis not present

## 2023-08-25 DIAGNOSIS — E876 Hypokalemia: Secondary | ICD-10-CM

## 2023-08-25 DIAGNOSIS — Z7984 Long term (current) use of oral hypoglycemic drugs: Secondary | ICD-10-CM

## 2023-08-25 DIAGNOSIS — J180 Bronchopneumonia, unspecified organism: Secondary | ICD-10-CM | POA: Diagnosis present

## 2023-08-25 DIAGNOSIS — Z794 Long term (current) use of insulin: Secondary | ICD-10-CM

## 2023-08-25 DIAGNOSIS — G9349 Other encephalopathy: Secondary | ICD-10-CM | POA: Diagnosis present

## 2023-08-25 DIAGNOSIS — G936 Cerebral edema: Secondary | ICD-10-CM | POA: Diagnosis not present

## 2023-08-25 DIAGNOSIS — Z5986 Financial insecurity: Secondary | ICD-10-CM

## 2023-08-25 DIAGNOSIS — Z8673 Personal history of transient ischemic attack (TIA), and cerebral infarction without residual deficits: Secondary | ICD-10-CM | POA: Diagnosis not present

## 2023-08-25 DIAGNOSIS — R059 Cough, unspecified: Secondary | ICD-10-CM | POA: Insufficient documentation

## 2023-08-25 DIAGNOSIS — Z7982 Long term (current) use of aspirin: Secondary | ICD-10-CM

## 2023-08-25 DIAGNOSIS — G9340 Encephalopathy, unspecified: Secondary | ICD-10-CM | POA: Diagnosis not present

## 2023-08-25 DIAGNOSIS — E1142 Type 2 diabetes mellitus with diabetic polyneuropathy: Secondary | ICD-10-CM | POA: Diagnosis present

## 2023-08-25 DIAGNOSIS — R112 Nausea with vomiting, unspecified: Secondary | ICD-10-CM

## 2023-08-25 DIAGNOSIS — I330 Acute and subacute infective endocarditis: Secondary | ICD-10-CM | POA: Diagnosis present

## 2023-08-25 DIAGNOSIS — Z7902 Long term (current) use of antithrombotics/antiplatelets: Secondary | ICD-10-CM

## 2023-08-25 DIAGNOSIS — I2510 Atherosclerotic heart disease of native coronary artery without angina pectoris: Secondary | ICD-10-CM | POA: Diagnosis present

## 2023-08-25 DIAGNOSIS — I639 Cerebral infarction, unspecified: Secondary | ICD-10-CM | POA: Diagnosis not present

## 2023-08-25 DIAGNOSIS — R1110 Vomiting, unspecified: Secondary | ICD-10-CM | POA: Insufficient documentation

## 2023-08-25 DIAGNOSIS — L03114 Cellulitis of left upper limb: Secondary | ICD-10-CM | POA: Diagnosis present

## 2023-08-25 DIAGNOSIS — J101 Influenza due to other identified influenza virus with other respiratory manifestations: Secondary | ICD-10-CM | POA: Diagnosis not present

## 2023-08-25 DIAGNOSIS — I6340 Cerebral infarction due to embolism of unspecified cerebral artery: Secondary | ICD-10-CM | POA: Diagnosis not present

## 2023-08-25 DIAGNOSIS — J189 Pneumonia, unspecified organism: Secondary | ICD-10-CM | POA: Diagnosis not present

## 2023-08-25 DIAGNOSIS — B951 Streptococcus, group B, as the cause of diseases classified elsewhere: Secondary | ICD-10-CM | POA: Diagnosis not present

## 2023-08-25 DIAGNOSIS — N400 Enlarged prostate without lower urinary tract symptoms: Secondary | ICD-10-CM | POA: Insufficient documentation

## 2023-08-25 DIAGNOSIS — E872 Acidosis, unspecified: Secondary | ICD-10-CM

## 2023-08-25 DIAGNOSIS — K5730 Diverticulosis of large intestine without perforation or abscess without bleeding: Secondary | ICD-10-CM | POA: Diagnosis not present

## 2023-08-25 DIAGNOSIS — Z83438 Family history of other disorder of lipoprotein metabolism and other lipidemia: Secondary | ICD-10-CM

## 2023-08-25 DIAGNOSIS — R4182 Altered mental status, unspecified: Secondary | ICD-10-CM | POA: Diagnosis not present

## 2023-08-25 DIAGNOSIS — R7881 Bacteremia: Secondary | ICD-10-CM

## 2023-08-25 DIAGNOSIS — R413 Other amnesia: Secondary | ICD-10-CM | POA: Diagnosis present

## 2023-08-25 DIAGNOSIS — I214 Non-ST elevation (NSTEMI) myocardial infarction: Secondary | ICD-10-CM | POA: Diagnosis present

## 2023-08-25 DIAGNOSIS — Z955 Presence of coronary angioplasty implant and graft: Secondary | ICD-10-CM

## 2023-08-25 DIAGNOSIS — R051 Acute cough: Secondary | ICD-10-CM

## 2023-08-25 DIAGNOSIS — R109 Unspecified abdominal pain: Secondary | ICD-10-CM | POA: Diagnosis not present

## 2023-08-25 DIAGNOSIS — Z807 Family history of other malignant neoplasms of lymphoid, hematopoietic and related tissues: Secondary | ICD-10-CM

## 2023-08-25 DIAGNOSIS — J9601 Acute respiratory failure with hypoxia: Secondary | ICD-10-CM | POA: Diagnosis present

## 2023-08-25 DIAGNOSIS — Z6841 Body Mass Index (BMI) 40.0 and over, adult: Secondary | ICD-10-CM | POA: Diagnosis not present

## 2023-08-25 DIAGNOSIS — J09X2 Influenza due to identified novel influenza A virus with other respiratory manifestations: Secondary | ICD-10-CM | POA: Diagnosis not present

## 2023-08-25 DIAGNOSIS — M79642 Pain in left hand: Secondary | ICD-10-CM | POA: Diagnosis not present

## 2023-08-25 DIAGNOSIS — Z888 Allergy status to other drugs, medicaments and biological substances status: Secondary | ICD-10-CM

## 2023-08-25 DIAGNOSIS — Z8249 Family history of ischemic heart disease and other diseases of the circulatory system: Secondary | ICD-10-CM

## 2023-08-25 DIAGNOSIS — E1165 Type 2 diabetes mellitus with hyperglycemia: Secondary | ICD-10-CM | POA: Diagnosis not present

## 2023-08-25 DIAGNOSIS — I252 Old myocardial infarction: Secondary | ICD-10-CM

## 2023-08-25 DIAGNOSIS — R5381 Other malaise: Secondary | ICD-10-CM | POA: Diagnosis present

## 2023-08-25 DIAGNOSIS — I6381 Other cerebral infarction due to occlusion or stenosis of small artery: Secondary | ICD-10-CM | POA: Diagnosis not present

## 2023-08-25 DIAGNOSIS — E878 Other disorders of electrolyte and fluid balance, not elsewhere classified: Secondary | ICD-10-CM | POA: Diagnosis present

## 2023-08-25 DIAGNOSIS — R29818 Other symptoms and signs involving the nervous system: Secondary | ICD-10-CM | POA: Diagnosis not present

## 2023-08-25 DIAGNOSIS — B954 Other streptococcus as the cause of diseases classified elsewhere: Secondary | ICD-10-CM | POA: Diagnosis not present

## 2023-08-25 DIAGNOSIS — R29702 NIHSS score 2: Secondary | ICD-10-CM | POA: Diagnosis not present

## 2023-08-25 HISTORY — DX: Cellulitis, unspecified: L03.90

## 2023-08-25 LAB — LIPASE, BLOOD: Lipase: 24 U/L (ref 11–51)

## 2023-08-25 LAB — CBC WITH DIFFERENTIAL/PLATELET
Abs Immature Granulocytes: 0.04 10*3/uL (ref 0.00–0.07)
Basophils Absolute: 0 10*3/uL (ref 0.0–0.1)
Basophils Relative: 0 %
Eosinophils Absolute: 0 10*3/uL (ref 0.0–0.5)
Eosinophils Relative: 0 %
HCT: 42.4 % (ref 39.0–52.0)
Hemoglobin: 14.5 g/dL (ref 13.0–17.0)
Immature Granulocytes: 0 %
Lymphocytes Relative: 3 %
Lymphs Abs: 0.2 10*3/uL — ABNORMAL LOW (ref 0.7–4.0)
MCH: 31 pg (ref 26.0–34.0)
MCHC: 34.2 g/dL (ref 30.0–36.0)
MCV: 90.8 fL (ref 80.0–100.0)
Monocytes Absolute: 0.8 10*3/uL (ref 0.1–1.0)
Monocytes Relative: 9 %
Neutro Abs: 8.1 10*3/uL — ABNORMAL HIGH (ref 1.7–7.7)
Neutrophils Relative %: 88 %
Platelets: 181 10*3/uL (ref 150–400)
RBC: 4.67 MIL/uL (ref 4.22–5.81)
RDW: 13.2 % (ref 11.5–15.5)
WBC: 9.3 10*3/uL (ref 4.0–10.5)
nRBC: 0 % (ref 0.0–0.2)

## 2023-08-25 LAB — RESP PANEL BY RT-PCR (RSV, FLU A&B, COVID)  RVPGX2
Influenza A by PCR: POSITIVE — AB
Influenza B by PCR: NEGATIVE
Resp Syncytial Virus by PCR: NEGATIVE
SARS Coronavirus 2 by RT PCR: NEGATIVE

## 2023-08-25 LAB — COMPREHENSIVE METABOLIC PANEL
ALT: 33 U/L (ref 0–53)
ALT: 33 U/L (ref 0–44)
AST: 20 U/L (ref 0–37)
AST: 22 U/L (ref 15–41)
Albumin: 4.3 g/dL (ref 3.5–5.2)
Albumin: 3.8 g/dL (ref 3.5–5.0)
Alkaline Phosphatase: 63 U/L (ref 39–117)
Alkaline Phosphatase: 54 U/L (ref 38–126)
Anion gap: 17 — ABNORMAL HIGH (ref 5–15)
BUN: 11 mg/dL (ref 6–23)
BUN: 12 mg/dL (ref 6–20)
CO2: 23 meq/L (ref 19–32)
CO2: 18 mmol/L — ABNORMAL LOW (ref 22–32)
Calcium: 8.7 mg/dL (ref 8.4–10.5)
Calcium: 8.4 mg/dL — ABNORMAL LOW (ref 8.9–10.3)
Chloride: 97 meq/L (ref 96–112)
Chloride: 96 mmol/L — ABNORMAL LOW (ref 98–111)
Creatinine, Ser: 0.74 mg/dL (ref 0.40–1.50)
Creatinine, Ser: 0.79 mg/dL (ref 0.61–1.24)
GFR, Estimated: >60 mL/min (ref >60)
GFR: 99.21 mL/min (ref >60.00)
Glucose, Bld: 110 mg/dL — ABNORMAL HIGH (ref 70–99)
Glucose, Bld: 154 mg/dL — ABNORMAL HIGH (ref 70–99)
Potassium: 3.1 meq/L — ABNORMAL LOW (ref 3.5–5.1)
Potassium: 2.9 mmol/L — ABNORMAL LOW (ref 3.5–5.1)
Sodium: 132 meq/L — ABNORMAL LOW (ref 135–145)
Sodium: 131 mmol/L — ABNORMAL LOW (ref 135–145)
Total Bilirubin: 0.6 mg/dL (ref 0.2–1.2)
Total Bilirubin: 1.2 mg/dL (ref 0.0–1.2)
Total Protein: 7.4 g/dL (ref 6.0–8.3)
Total Protein: 7.2 g/dL (ref 6.5–8.1)

## 2023-08-25 LAB — POCT INFLUENZA A/B
Influenza A, POC: NEGATIVE
Influenza B, POC: NEGATIVE

## 2023-08-25 LAB — LACTIC ACID, PLASMA
Lactic Acid, Venous: 1.4 mmol/L (ref 0.5–1.9)
Lactic Acid, Venous: 1.1 mmol/L (ref 0.5–1.9)

## 2023-08-25 LAB — LIPASE: Lipase: 28 U/L (ref 11.0–59.0)

## 2023-08-25 LAB — POC COVID19 BINAXNOW: SARS Coronavirus 2 Ag: NEGATIVE

## 2023-08-25 LAB — TROPONIN I (HIGH SENSITIVITY)
Troponin I (High Sensitivity): 89 ng/L — ABNORMAL HIGH (ref <18)
Troponin I (High Sensitivity): 272 ng/L (ref <18)

## 2023-08-25 MED ORDER — ONDANSETRON 4 MG PO TBDP: 4.0000 mg | ORAL_TABLET | Freq: Three times a day (TID) | ORAL | 0 refills | Status: DC | PRN

## 2023-08-25 MED ORDER — LACTATED RINGERS IV BOLUS (SEPSIS)
1000.0000 mL | Freq: Once | INTRAVENOUS | Status: AC
  Administered 2023-08-25: 1000 mL via INTRAVENOUS

## 2023-08-25 MED ORDER — ACETAMINOPHEN 500 MG PO TABS
1000.0000 mg | ORAL_TABLET | Freq: Once | ORAL | Status: AC
  Administered 2023-08-25: 1000 mg via ORAL
  Filled 2023-08-25: qty 2

## 2023-08-25 MED ORDER — LACTATED RINGERS IV SOLN: INTRAVENOUS | Status: DC

## 2023-08-25 MED ORDER — CEFADROXIL 500 MG PO CAPS: 500.0000 mg | ORAL_CAPSULE | Freq: Two times a day (BID) | ORAL | 0 refills | Status: DC

## 2023-08-25 MED ORDER — IOHEXOL 350 MG/ML SOLN
125.0000 mL | Freq: Once | INTRAVENOUS | Status: AC | PRN
  Administered 2023-08-25: 125 mL via INTRAVENOUS

## 2023-08-25 NOTE — ED Notes (Signed)
 Patient transported to CT

## 2023-08-25 NOTE — Progress Notes (Signed)
 Nelly Banco, MD Phone: [phone]  Connor Ross is a 58 y.o. male who presents today for same-day visit.  Vomiting/cough: Patient notes onset of symptoms last Wednesday.  Had several days of vomiting and did have diarrhea the other day.  Notes some sharp abdominal discomfort in his mid abdomen though this has improved some.  Notes no blood in his vomitus.  Has been coughing and it is somewhat productive.  No postnasal drip.  Does note some sore throat from vomiting.  Has had some bodyaches.  Some dyspnea particularly with wearing the mask.  Tmax 100.2 F in the office today.  Urine has been darker than typical.  Left wrist swelling: Patient notes onset yesterday.  Notes it was mildly swollen initially though has worsened some and has developed some erythema.  He wonders if he was bitten by something while he was asleep.  Social History   Tobacco Use  Smoking Status Never  Smokeless Tobacco Never    Current Outpatient Medications on File Prior to Visit  Medication Sig Dispense Refill   ASPIRIN  LOW DOSE 81 MG EC tablet TAKE 1 TABLET BY MOUTH  DAILY (SWALLOW WHOLE) 90 tablet 1   blood glucose meter kit and supplies Dispense based on patient and insurance preference. Use up to four times daily as directed. (FOR ICD-10 E10.9, E11.9). Use to check blood sugar three times daily 1 each 0   clobetasol cream (TEMOVATE) 0.05 % Apply topically 2 (two) times daily.     clopidogrel  (PLAVIX ) 75 MG tablet Take 75 mg by mouth daily.     Continuous Blood Gluc Sensor (FREESTYLE LIBRE 2 SENSOR) MISC Use to check sugar at least 4 times daily 1 each 2   empagliflozin  (JARDIANCE ) 25 MG TABS tablet Take 1 tablet (25 mg total) by mouth daily. 90 tablet 3   fluticasone  (FLONASE ) 50 MCG/ACT nasal spray PLACE 2 SPRAYS INTO BOTH NOSTRILS DAILY. AT NIGHT AFTER NASAL SALINE 16 g 2   furosemide  (LASIX ) 20 MG tablet TAKE 1 TABLET EVERY OTHER DAY 45 tablet 3   gabapentin  (NEURONTIN ) 100 MG capsule TAKE 2  CAPSULES THREE TIMES DAILY 540 capsule 3   insulin  aspart (NOVOLOG  FLEXPEN) 100 UNIT/ML FlexPen Inject up to 25 units with breakfast, 25 units with lunch, and 30 units with supper 72 mL 2   insulin  degludec (TRESIBA  FLEXTOUCH) 200 UNIT/ML FlexTouch Pen Inject 88 Units into the skin daily. (Patient taking differently: Inject 86 Units into the skin daily.) 48 mL 2   Insulin  Pen Needle 32G X 6 MM MISC Use daily with insulin  pen 50 each 0   ketoconazole (NIZORAL) 2 % cream SMARTSIG:1 Application Topical 1 to 2 Times Daily     Lancets (FREESTYLE) lancets 1 each by Other route 2 (two) times daily. DX 250.02 100 each 12   losartan  (COZAAR ) 100 MG tablet TAKE 1 TABLET EVERY DAY 90 tablet 3   metoprolol  tartrate (LOPRESSOR ) 50 MG tablet Take 1 tablet (50 mg total) by mouth 2 (two) times daily. 180 tablet 1   Multiple Vitamin (MULTIVITAMIN) tablet Take 1 tablet by mouth daily.     ONETOUCH VERIO test strip USE TO CHECK BLOOD SUGAR 3  TIMES DAILY 300 strip 3   rosuvastatin  (CRESTOR ) 40 MG tablet Take 1 tablet (40 mg total) by mouth every evening for 360 doses. 90 tablet 3   Semaglutide , 2 MG/DOSE, (OZEMPIC , 2 MG/DOSE,) 8 MG/3ML SOPN Inject 2 mg into the skin once a week. 8 mL 2   tamsulosin  (  FLOMAX ) 0.4 MG CAPS capsule TAKE 1 CAPSULE EVERY DAY 90 capsule 3   traMADol  (ULTRAM ) 50 MG tablet Take 1 tablet (50 mg total) by mouth every 6 (six) hours as needed. 360 tablet 1   triamcinolone  (KENALOG) 0.025 % cream Apply topically.     Upadacitinib ER (RINVOQ) 15 MG TB24 Take 15 mg by mouth daily.     Vitamin D , Ergocalciferol , (DRISDOL ) 1.25 MG (50000 UNIT) CAPS capsule Take 50,000 Units by mouth once a week.     No current facility-administered medications on file prior to visit.     ROS see history of present illness  Objective  Physical Exam Vitals:   08/25/23 1125  BP: 120/66  Pulse: 90  Temp: 100.2 F (37.9 C)  SpO2: 97%    BP Readings from Last 3 Encounters:  08/25/23 120/66  07/04/23 (!)  152/70  04/02/23 (!) 140/76   Wt Readings from Last 3 Encounters:  08/25/23 290 lb 3.2 oz (131.6 kg)  07/04/23 (!) 304 lb (137.9 kg)  04/02/23 (!) 317 lb 3.2 oz (143.9 kg)    Physical Exam Constitutional:      General: He is not in acute distress.    Appearance: He is not diaphoretic.  Cardiovascular:     Rate and Rhythm: Normal rate and regular rhythm.     Heart sounds: Normal heart sounds.  Pulmonary:     Effort: Pulmonary effort is normal.     Breath sounds: Normal breath sounds.  Abdominal:     General: Bowel sounds are normal. There is no distension.     Palpations: Abdomen is soft.     Tenderness: There is no abdominal tenderness.  Skin:    General: Skin is warm and dry.  Neurological:     Mental Status: He is alert.      Assessment/Plan: Please see individual problem list.  Nausea and vomiting, unspecified vomiting type Assessment & Plan: Acute issue with systemic symptoms.  Suspect patient has gastroenteritis from a viral cause.  Given his abdominal pain we will get lab work as outlined.  Discussed adequate hydration.  Lab work will also check for any dehydration.  If he has progressive trouble urinating he will seek medical attention.  Zofran  provided for nausea.  Advised patient should hold off on taking his Jardiance  while he is sick and he can resume this once vomiting and oral intake improved.  Orders: -     Comprehensive metabolic panel -     Lipase -     CBC with Differential/Platelet -     Ondansetron ; Take 1 tablet (4 mg total) by mouth every 8 (eight) hours as needed for nausea or vomiting.  Dispense: 20 tablet; Refill: 0  Acute cough Assessment & Plan: Likely related to viral illness.  Lung exam is reassuring.  COVID and flu testing negative today.  Suspect symptoms should start to improve in the next several days.  If breathing worsens or if he develops worsening fevers or any new symptoms or symptoms are not improving he should be reevaluated right  away.  Orders: -     POC COVID-19 BinaxNow -     POCT Influenza A/B  Cellulitis of left upper extremity Assessment & Plan: Erythema on left hand and wrist seems consistent with cellulitis.  Possibly related to an insect bite.  Will treat with cefadroxil  500 mg twice daily for 7 days.  He will follow-up in 2 days for recheck.  Advised to seek medical attention if he has  spreading redness while on the antibiotic.  Orders: -     Cefadroxil ; Take 1 capsule (500 mg total) by mouth 2 (two) times daily.  Dispense: 14 capsule; Refill: 0    Return in about 2 days (around 08/27/2023) for Recheck cellulitis.   Nelly Banco, MD [facility]

## 2023-08-25 NOTE — Patient Instructions (Addendum)
 Nice to see you. You likely have a viral illness.  Your COVID and flu testing were both negative today. You can use the Zofran  to help with any nausea.  Please make sure you are taking in adequate fluid intake. Will get lab work today and contact you with the results. Please take the antibiotic for the skin area on your wrist.  If he has spreading redness please seek medical attention.

## 2023-08-25 NOTE — ED Notes (Signed)
 Pt incontinent of urine, linens changed and pt changed into gown.

## 2023-08-25 NOTE — Assessment & Plan Note (Addendum)
 Erythema on left hand and wrist seems consistent with cellulitis.  Possibly related to an insect bite.  Will treat with cefadroxil  500 mg twice daily for 7 days.  He will follow-up in 2 days for recheck.  Advised to seek medical attention if he has spreading redness while on the antibiotic.

## 2023-08-25 NOTE — ED Triage Notes (Signed)
 Pt presents to the ED via [facility] from home. Pt arrives with fever, AMS, N/V/D, abdominal pain, and increased urination. Pt is a diabetic and takes ozempic . Pt was A&Ox4 this morning at PCP and now pt is alert and oriented to only self. Pt does have an oral temp of 103.3.  EMS gave 500mL NaCl. No tylenol  given.

## 2023-08-25 NOTE — Assessment & Plan Note (Addendum)
 Likely related to viral illness.  Lung exam is reassuring.  COVID and flu testing negative today.  Suspect symptoms should start to improve in the next several days.  If breathing worsens or if he develops worsening fevers or any new symptoms or symptoms are not improving he should be reevaluated right away.

## 2023-08-25 NOTE — Assessment & Plan Note (Addendum)
 Acute issue with systemic symptoms.  Suspect patient has gastroenteritis from a viral cause.  Given his abdominal pain we will get lab work as outlined.  Discussed adequate hydration.  Lab work will also check for any dehydration.  If he has progressive trouble urinating he will seek medical attention.  Zofran  provided for nausea.  Advised patient should hold off on taking his Jardiance  while he is sick and he can resume this once vomiting and oral intake improved.

## 2023-08-25 NOTE — ED Provider Notes (Signed)
 [facility] Provider Note    Event Date/Time   First MD Initiated Contact with Patient 08/25/23 2058     (approximate)   History   Fever, Emesis, and Diarrhea   HPI  Connor Ross is a 58 y.o. male with a history of type 2 diabetes and hypertension who presents with fever and altered mental status.  Per EMS the patient has been having nausea, vomiting, diarrhea as well as a fever.  He was seen by his doctor today and was at his baseline mental status at that time but subsequent became confused.  The patient himself states he feels relatively okay.  He denies headache, chest pain, abdominal pain, and states he had some loose stools but not really diarrhea.  However, he appears confused.  I reviewed the past medical records.  The patient was seen by Dr. Lovetta Rucks from family medicine this morning reporting cough, vomiting, diarrhea that started last Wednesday.  He was determined to have a likely viral illness.  COVID and influenza swab was negative at that time.  He was also diagnosed with left hand cellulitis.   Physical Exam   Triage Vital Signs: ED Triage Vitals [08/25/23 2107]  Encounter Vitals Group     BP (!) 156/66     Systolic BP Percentile      Diastolic BP Percentile      Pulse Rate (!) 122     Resp 18     Temp (!) 103.3 F (39.6 C)     Temp src      SpO2 95 %     Weight      Height 5\' 11"  (1.803 m)     Head Circumference      Peak Flow      Pain Score 5     Pain Loc      Pain Education      Exclude from Growth Chart     Most recent vital signs: Vitals:   08/25/23 2250 08/25/23 2300  BP:  120/65  Pulse: (!) 102 (!) 101  Resp: (!) 37 19  Temp:    SpO2: 96% 97%     General: Awake, oriented x 2, no distress.  CV:  Good peripheral perfusion.  Resp:  Normal effort.  Lungs CTAB. Abd:  Soft and nontender.  No distention.  Other:  Neck supple, full ROM.  Motor intact in all extremities.  Normal speech.   ED Results /  Procedures / Treatments   Labs (all labs ordered are listed, but only abnormal results are displayed) Labs Reviewed  RESP PANEL BY RT-PCR (RSV, FLU A&B, COVID)  [id number] - Abnormal; Notable for the following components:      Result Value   Influenza A by PCR POSITIVE (*)    All other components within normal limits  COMPREHENSIVE METABOLIC PANEL - Abnormal; Notable for the following components:   Sodium 131 (*)    Potassium 2.9 (*)    Chloride 96 (*)    CO2 18 (*)    Glucose, Bld 154 (*)    Calcium  8.4 (*)    Anion gap 17 (*)    All other components within normal limits  CBC WITH DIFFERENTIAL/PLATELET - Abnormal; Notable for the following components:   Neutro Abs 8.1 (*)    Lymphs Abs 0.2 (*)    All other components within normal limits  TROPONIN I (HIGH SENSITIVITY) - Abnormal; Notable for the following components:   Troponin I (High Sensitivity) 89 (*)  All other components within normal limits  CULTURE, BLOOD (ROUTINE X 2)  CULTURE, BLOOD (ROUTINE X 2)  LACTIC ACID, PLASMA  LIPASE, BLOOD  LACTIC ACID, PLASMA  URINALYSIS, W/ REFLEX TO CULTURE (INFECTION SUSPECTED)  TROPONIN I (HIGH SENSITIVITY)     EKG  ED ECG REPORT I, Lind Repine, the attending physician, personally viewed and interpreted this ECG.  Date: 08/25/2023 EKG Time: 2115 Rate: 136 Rhythm: sinus tachycardia  QRS Axis: normal Intervals: normal ST/T Wave abnormalities: Nonspecific T wave abnormalities Narrative Interpretation: no evidence of acute ischemia    RADIOLOGY  Chest x-ray: I independently viewed and interpreted the images; there is no focal consolidation or edema  CT head:   IMPRESSION:  No acute intracranial finding.    Apparent soft tissue swelling of the right posterior parietal scalp,  presumed hematoma.     PROCEDURES:  Critical Care performed: No  Procedures   MEDICATIONS ORDERED IN ED: Medications  lactated ringers  infusion ( Intravenous New Bag/Given 08/25/23  2209)  acetaminophen  (TYLENOL ) tablet 1,000 mg (1,000 mg Oral Given 08/25/23 2135)  lactated ringers  bolus 1,000 mL (0 mLs Intravenous Stopped 08/25/23 2305)  iohexol  (OMNIPAQUE ) 350 MG/ML injection 125 mL (125 mLs Intravenous Contrast Given 08/25/23 2216)     IMPRESSION / MDM / ASSESSMENT AND PLAN / ED COURSE  I reviewed the triage vital signs and the nursing notes.  58 year old male with PMH as noted above presents with nausea, vomiting, diarrhea, and cough since last week, but now with altered mental status since earlier today.  On exam the patient is febrile and tachycardic.  He is awake and able to answer questions although seems somewhat confused.  Thorough neurologic exam is nonfocal.  He has no meningeal signs.  Differential diagnosis includes, but is not limited to, influenza or other viral syndrome, UTI, pneumonia, intra-abdominal infection.  There is no evidence of meningitis or encephalitis.  We will obtain chest x-ray, CT head, CT abdomen/pelvis, lab workup, give fluids and reassess.  Patient's presentation is most consistent with acute presentation with potential threat to life or bodily function.  The patient is on the cardiac monitor to evaluate for evidence of arrhythmia and/or significant heart rate changes.  ----------------------------------------- 11:05 PM on 08/25/2023 -----------------------------------------  Respiratory panel is positive for flu A.  Chest x-ray shows no significant findings.  Lab workup is overall reassuring with a normal lactate and no leukocytosis.  Troponin is slightly elevated, likely mild demand ischemia.  CT head is negative.  CT abdomen/pelvis is pending.  The patient will need admission due to his altered mental status.  He will be signed out to the oncoming ED physician for follow-up of the CT.   FINAL CLINICAL IMPRESSION(S) / ED DIAGNOSES   Final diagnoses:  Influenza A  Delirium     Rx / DC Orders   ED Discharge Orders     None         Note:  This document was prepared using Dragon voice recognition software and may include unintentional dictation errors.    Lind Repine, MD 08/25/23 [phone]

## 2023-08-25 NOTE — ED Notes (Signed)
 Pt contact updated on care.

## 2023-08-25 NOTE — ED Notes (Addendum)
 Care handoff received. Pt here for fever and AMS. Baseline A+Ox4 and independent, now A+Ox2, oriented to self and place. Denies complaints including N/V/CP/SOB. Tachypneic and mildly hypoxic on transfer to room, 2L [place] placed.

## 2023-08-26 ENCOUNTER — Inpatient Hospital Stay: Payer: Medicare HMO

## 2023-08-26 ENCOUNTER — Other Ambulatory Visit: Payer: Self-pay | Admitting: Family Medicine

## 2023-08-26 DIAGNOSIS — E872 Acidosis, unspecified: Secondary | ICD-10-CM

## 2023-08-26 DIAGNOSIS — J189 Pneumonia, unspecified organism: Secondary | ICD-10-CM | POA: Diagnosis not present

## 2023-08-26 DIAGNOSIS — I214 Non-ST elevation (NSTEMI) myocardial infarction: Secondary | ICD-10-CM | POA: Diagnosis present

## 2023-08-26 DIAGNOSIS — R4182 Altered mental status, unspecified: Secondary | ICD-10-CM | POA: Diagnosis not present

## 2023-08-26 DIAGNOSIS — G936 Cerebral edema: Secondary | ICD-10-CM | POA: Diagnosis not present

## 2023-08-26 DIAGNOSIS — I10 Essential (primary) hypertension: Secondary | ICD-10-CM | POA: Insufficient documentation

## 2023-08-26 DIAGNOSIS — J180 Bronchopneumonia, unspecified organism: Secondary | ICD-10-CM | POA: Diagnosis present

## 2023-08-26 DIAGNOSIS — G9340 Encephalopathy, unspecified: Secondary | ICD-10-CM | POA: Diagnosis not present

## 2023-08-26 DIAGNOSIS — E876 Hypokalemia: Secondary | ICD-10-CM

## 2023-08-26 DIAGNOSIS — I2510 Atherosclerotic heart disease of native coronary artery without angina pectoris: Secondary | ICD-10-CM | POA: Diagnosis present

## 2023-08-26 DIAGNOSIS — J101 Influenza due to other identified influenza virus with other respiratory manifestations: Secondary | ICD-10-CM | POA: Diagnosis not present

## 2023-08-26 DIAGNOSIS — A4181 Sepsis due to Enterococcus: Secondary | ICD-10-CM | POA: Insufficient documentation

## 2023-08-26 DIAGNOSIS — E785 Hyperlipidemia, unspecified: Secondary | ICD-10-CM | POA: Diagnosis present

## 2023-08-26 DIAGNOSIS — I6340 Cerebral infarction due to embolism of unspecified cerebral artery: Secondary | ICD-10-CM | POA: Diagnosis not present

## 2023-08-26 DIAGNOSIS — A419 Sepsis, unspecified organism: Secondary | ICD-10-CM

## 2023-08-26 DIAGNOSIS — Z6841 Body Mass Index (BMI) 40.0 and over, adult: Secondary | ICD-10-CM | POA: Diagnosis not present

## 2023-08-26 DIAGNOSIS — L03114 Cellulitis of left upper limb: Secondary | ICD-10-CM | POA: Diagnosis present

## 2023-08-26 DIAGNOSIS — A401 Sepsis due to streptococcus, group B: Secondary | ICD-10-CM | POA: Diagnosis present

## 2023-08-26 DIAGNOSIS — I330 Acute and subacute infective endocarditis: Secondary | ICD-10-CM | POA: Diagnosis present

## 2023-08-26 DIAGNOSIS — B951 Streptococcus, group B, as the cause of diseases classified elsewhere: Secondary | ICD-10-CM

## 2023-08-26 DIAGNOSIS — J9601 Acute respiratory failure with hypoxia: Secondary | ICD-10-CM | POA: Diagnosis present

## 2023-08-26 DIAGNOSIS — I6381 Other cerebral infarction due to occlusion or stenosis of small artery: Secondary | ICD-10-CM | POA: Diagnosis not present

## 2023-08-26 DIAGNOSIS — I700 Atherosclerosis of aorta: Secondary | ICD-10-CM | POA: Diagnosis present

## 2023-08-26 DIAGNOSIS — E871 Hypo-osmolality and hyponatremia: Secondary | ICD-10-CM | POA: Diagnosis present

## 2023-08-26 DIAGNOSIS — Z1152 Encounter for screening for COVID-19: Secondary | ICD-10-CM | POA: Diagnosis not present

## 2023-08-26 DIAGNOSIS — N400 Enlarged prostate without lower urinary tract symptoms: Secondary | ICD-10-CM | POA: Insufficient documentation

## 2023-08-26 DIAGNOSIS — G9349 Other encephalopathy: Secondary | ICD-10-CM | POA: Diagnosis present

## 2023-08-26 DIAGNOSIS — J1008 Influenza due to other identified influenza virus with other specified pneumonia: Secondary | ICD-10-CM | POA: Diagnosis present

## 2023-08-26 DIAGNOSIS — B954 Other streptococcus as the cause of diseases classified elsewhere: Secondary | ICD-10-CM | POA: Diagnosis not present

## 2023-08-26 DIAGNOSIS — R410 Disorientation, unspecified: Secondary | ICD-10-CM | POA: Diagnosis not present

## 2023-08-26 DIAGNOSIS — Z794 Long term (current) use of insulin: Secondary | ICD-10-CM | POA: Diagnosis not present

## 2023-08-26 DIAGNOSIS — E1142 Type 2 diabetes mellitus with diabetic polyneuropathy: Secondary | ICD-10-CM | POA: Diagnosis present

## 2023-08-26 DIAGNOSIS — Z8673 Personal history of transient ischemic attack (TIA), and cerebral infarction without residual deficits: Secondary | ICD-10-CM | POA: Diagnosis not present

## 2023-08-26 DIAGNOSIS — E1165 Type 2 diabetes mellitus with hyperglycemia: Secondary | ICD-10-CM | POA: Diagnosis not present

## 2023-08-26 DIAGNOSIS — R7881 Bacteremia: Secondary | ICD-10-CM | POA: Diagnosis not present

## 2023-08-26 HISTORY — DX: Non-ST elevation (NSTEMI) myocardial infarction: I21.4

## 2023-08-26 HISTORY — DX: Sepsis, unspecified organism: A41.9

## 2023-08-26 HISTORY — DX: Sepsis due to Enterococcus: A41.81

## 2023-08-26 HISTORY — DX: Influenza due to other identified influenza virus with other respiratory manifestations: J10.1

## 2023-08-26 LAB — BASIC METABOLIC PANEL
Anion gap: 10 (ref 5–15)
BUN: 9 mg/dL (ref 6–20)
CO2: 14 mmol/L — ABNORMAL LOW (ref 22–32)
Calcium: 5.6 mg/dL — CL (ref 8.9–10.3)
Chloride: 115 mmol/L — ABNORMAL HIGH (ref 98–111)
Creatinine, Ser: 0.53 mg/dL — ABNORMAL LOW (ref 0.61–1.24)
GFR, Estimated: >60 mL/min (ref >60)
Glucose, Bld: 79 mg/dL (ref 70–99)
Potassium: 4.4 mmol/L (ref 3.5–5.1)
Sodium: 139 mmol/L (ref 135–145)

## 2023-08-26 LAB — URINALYSIS, W/ REFLEX TO CULTURE (INFECTION SUSPECTED)
Bilirubin Urine: NEGATIVE
Glucose, UA: >=500 mg/dL — AB
Ketones, ur: 20 mg/dL — AB
Leukocytes,Ua: NEGATIVE
Nitrite: NEGATIVE
Protein, ur: 30 mg/dL — AB
Specific Gravity, Urine: >1.046 — ABNORMAL HIGH (ref 1.005–1.030)
Squamous Epithelial / HPF: 0 /[HPF] (ref 0–5)
pH: 5 (ref 5.0–8.0)

## 2023-08-26 LAB — BLOOD CULTURE ID PANEL (REFLEXED) - BCID2

## 2023-08-26 LAB — HEPARIN LEVEL (UNFRACTIONATED)
Heparin Unfractionated: 0.44 [IU]/mL (ref 0.30–0.70)
Heparin Unfractionated: 0.33 [IU]/mL (ref 0.30–0.70)

## 2023-08-26 LAB — LIPID PANEL
Cholesterol: 73 mg/dL (ref 0–200)
HDL: 17 mg/dL — ABNORMAL LOW (ref >40)
LDL Cholesterol: 39 mg/dL (ref 0–99)
Total CHOL/HDL Ratio: 4.3 {ratio}
Triglycerides: 83 mg/dL (ref <150)
VLDL: 17 mg/dL (ref 0–40)

## 2023-08-26 LAB — TROPONIN I (HIGH SENSITIVITY)
Troponin I (High Sensitivity): 576 ng/L (ref <18)
Troponin I (High Sensitivity): 607 ng/L (ref <18)
Troponin I (High Sensitivity): 1071 ng/L (ref <18)
Troponin I (High Sensitivity): 1226 ng/L (ref <18)
Troponin I (High Sensitivity): 817 ng/L (ref <18)

## 2023-08-26 LAB — CBC
HCT: 41.6 % (ref 39.0–52.0)
Hemoglobin: 13.7 g/dL (ref 13.0–17.0)
MCH: 30.7 pg (ref 26.0–34.0)
MCHC: 32.9 g/dL (ref 30.0–36.0)
MCV: 93.3 fL (ref 80.0–100.0)
Platelets: 167 10*3/uL (ref 150–400)
RBC: 4.46 MIL/uL (ref 4.22–5.81)
RDW: 13.4 % (ref 11.5–15.5)
WBC: 12.7 10*3/uL — ABNORMAL HIGH (ref 4.0–10.5)
nRBC: 0 % (ref 0.0–0.2)

## 2023-08-26 LAB — APTT: aPTT: 33 s (ref 24–36)

## 2023-08-26 LAB — HIV ANTIBODY (ROUTINE TESTING W REFLEX): HIV Screen 4th Generation wRfx: NONREACTIVE

## 2023-08-26 LAB — PROTIME-INR
INR: 1.1 (ref 0.8–1.2)
Prothrombin Time: 14.4 s (ref 11.4–15.2)

## 2023-08-26 LAB — C DIFFICILE QUICK SCREEN W PCR REFLEX
C Diff antigen: NEGATIVE
C Diff interpretation: NOT DETECTED
C Diff toxin: NEGATIVE

## 2023-08-26 MED ORDER — EMPAGLIFLOZIN 25 MG PO TABS
25.0000 mg | ORAL_TABLET | Freq: Every day | ORAL | Status: DC
  Administered 2023-08-26 – 2023-09-02 (×8): 25 mg via ORAL
  Filled 2023-08-26 (×8): qty 1

## 2023-08-26 MED ORDER — LOSARTAN POTASSIUM 50 MG PO TABS
100.0000 mg | ORAL_TABLET | Freq: Every day | ORAL | Status: DC
  Administered 2023-08-26 – 2023-09-02 (×8): 100 mg via ORAL
  Filled 2023-08-26 (×8): qty 2

## 2023-08-26 MED ORDER — CALCIUM GLUCONATE-NACL 1-0.675 GM/50ML-% IV SOLN
1.0000 g | Freq: Once | INTRAVENOUS | Status: AC
  Administered 2023-08-26: 1000 mg via INTRAVENOUS
  Filled 2023-08-26: qty 50

## 2023-08-26 MED ORDER — SODIUM CHLORIDE 0.9 % IV SOLN
1.0000 g | Freq: Once | INTRAVENOUS | Status: AC
  Administered 2023-08-26: 1 g via INTRAVENOUS
  Filled 2023-08-26: qty 10

## 2023-08-26 MED ORDER — SODIUM CHLORIDE 0.9 % IV SOLN
2.0000 g | INTRAVENOUS | Status: DC
  Administered 2023-08-26 – 2023-09-01 (×7): 2 g via INTRAVENOUS
  Filled 2023-08-26 (×8): qty 20

## 2023-08-26 MED ORDER — ACETAMINOPHEN 325 MG PO TABS
650.0000 mg | ORAL_TABLET | ORAL | Status: DC | PRN
  Administered 2023-08-26: 650 mg via ORAL
  Filled 2023-08-26: qty 2

## 2023-08-26 MED ORDER — MAGNESIUM HYDROXIDE 400 MG/5ML PO SUSP: 30.0000 mL | Freq: Every day | ORAL | Status: DC | PRN

## 2023-08-26 MED ORDER — IPRATROPIUM-ALBUTEROL 0.5-2.5 (3) MG/3ML IN SOLN
3.0000 mL | Freq: Four times a day (QID) | RESPIRATORY_TRACT | Status: DC
  Administered 2023-08-26 – 2023-08-27 (×5): 3 mL via RESPIRATORY_TRACT
  Filled 2023-08-26 (×5): qty 3

## 2023-08-26 MED ORDER — TRAMADOL HCL 50 MG PO TABS: 50.0000 mg | ORAL_TABLET | Freq: Four times a day (QID) | ORAL | Status: DC | PRN

## 2023-08-26 MED ORDER — POTASSIUM CHLORIDE IN NACL 20-0.9 MEQ/L-% IV SOLN
INTRAVENOUS | Status: DC
  Filled 2023-08-26: qty 1000

## 2023-08-26 MED ORDER — ASPIRIN 300 MG RE SUPP: 300.0000 mg | RECTAL | Status: AC

## 2023-08-26 MED ORDER — NITROGLYCERIN 0.4 MG SL SUBL: 0.4000 mg | SUBLINGUAL_TABLET | SUBLINGUAL | Status: DC | PRN

## 2023-08-26 MED ORDER — OSELTAMIVIR PHOSPHATE 75 MG PO CAPS
75.0000 mg | ORAL_CAPSULE | Freq: Two times a day (BID) | ORAL | Status: DC
  Administered 2023-08-26 – 2023-08-29 (×7): 75 mg via ORAL
  Filled 2023-08-26 (×9): qty 1

## 2023-08-26 MED ORDER — POTASSIUM CHLORIDE 20 MEQ PO PACK
40.0000 meq | PACK | Freq: Once | ORAL | Status: AC
Start: 2023-08-26 — End: 2023-08-26
  Administered 2023-08-26: 40 meq via ORAL
  Filled 2023-08-26: qty 2

## 2023-08-26 MED ORDER — ASPIRIN 81 MG PO TBEC
81.0000 mg | DELAYED_RELEASE_TABLET | Freq: Every day | ORAL | Status: DC
  Administered 2023-08-26 – 2023-09-02 (×8): 81 mg via ORAL
  Filled 2023-08-26 (×8): qty 1

## 2023-08-26 MED ORDER — OYSTER SHELL CALCIUM/D3 500-5 MG-MCG PO TABS
1.0000 | ORAL_TABLET | Freq: Three times a day (TID) | ORAL | Status: DC
Start: 2023-08-26 — End: 2023-08-26

## 2023-08-26 MED ORDER — MORPHINE SULFATE (PF) 2 MG/ML IV SOLN: 2.0000 mg | INTRAVENOUS | Status: DC | PRN

## 2023-08-26 MED ORDER — HYDROCOD POLI-CHLORPHE POLI ER 10-8 MG/5ML PO SUER: 5.0000 mL | Freq: Two times a day (BID) | ORAL | Status: DC | PRN

## 2023-08-26 MED ORDER — METOPROLOL TARTRATE 50 MG PO TABS
50.0000 mg | ORAL_TABLET | Freq: Two times a day (BID) | ORAL | Status: DC
  Administered 2023-08-26 – 2023-09-02 (×15): 50 mg via ORAL
  Filled 2023-08-26 (×5): qty 1
  Filled 2023-08-26: qty 2
  Filled 2023-08-26 (×10): qty 1

## 2023-08-26 MED ORDER — POTASSIUM CHLORIDE 20 MEQ PO PACK
40.0000 meq | PACK | Freq: Once | ORAL | Status: AC
  Administered 2023-08-26: 40 meq via ORAL
  Filled 2023-08-26: qty 2

## 2023-08-26 MED ORDER — ONDANSETRON HCL 4 MG/2ML IJ SOLN
4.0000 mg | Freq: Four times a day (QID) | INTRAMUSCULAR | Status: DC | PRN
Start: 2023-08-26 — End: 2023-09-02
  Administered 2023-08-26 – 2023-08-27 (×3): 4 mg via INTRAVENOUS
  Filled 2023-08-26 (×3): qty 2

## 2023-08-26 MED ORDER — SODIUM BICARBONATE 8.4 % IV SOLN
INTRAVENOUS | Status: DC
  Filled 2023-08-26: qty 1000
  Filled 2023-08-26: qty 150
  Filled 2023-08-26: qty 1000
  Filled 2023-08-26: qty 150
  Filled 2023-08-26: qty 1000

## 2023-08-26 MED ORDER — ADULT MULTIVITAMIN W/MINERALS CH
1.0000 | ORAL_TABLET | Freq: Every day | ORAL | Status: DC
  Administered 2023-08-26 – 2023-09-02 (×7): 1 via ORAL
  Filled 2023-08-26 (×8): qty 1

## 2023-08-26 MED ORDER — HEPARIN (PORCINE) 25000 UT/250ML-% IV SOLN
1500.0000 [IU]/h | INTRAVENOUS | Status: DC
  Administered 2023-08-26 (×3): 1500 [IU]/h via INTRAVENOUS
  Filled 2023-08-26 (×3): qty 250

## 2023-08-26 MED ORDER — FUROSEMIDE 20 MG PO TABS
20.0000 mg | ORAL_TABLET | ORAL | Status: DC
Start: 2023-08-26 — End: 2023-09-02
  Administered 2023-08-26 – 2023-09-01 (×4): 20 mg via ORAL
  Filled 2023-08-26 (×4): qty 1

## 2023-08-26 MED ORDER — GUAIFENESIN ER 600 MG PO TB12
600.0000 mg | ORAL_TABLET | Freq: Two times a day (BID) | ORAL | Status: DC
  Administered 2023-08-26 – 2023-09-02 (×16): 600 mg via ORAL
  Filled 2023-08-26 (×16): qty 1

## 2023-08-26 MED ORDER — VITAMIN D (ERGOCALCIFEROL) 1.25 MG (50000 UNIT) PO CAPS
50000.0000 [IU] | ORAL_CAPSULE | ORAL | Status: DC
  Administered 2023-08-26 – 2023-09-02 (×2): 50000 [IU] via ORAL
  Filled 2023-08-26 (×2): qty 1

## 2023-08-26 MED ORDER — CLOPIDOGREL BISULFATE 75 MG PO TABS
75.0000 mg | ORAL_TABLET | Freq: Every day | ORAL | Status: DC
  Administered 2023-08-26 – 2023-09-02 (×8): 75 mg via ORAL
  Filled 2023-08-26 (×8): qty 1

## 2023-08-26 MED ORDER — ROSUVASTATIN CALCIUM 10 MG PO TABS
40.0000 mg | ORAL_TABLET | Freq: Every evening | ORAL | Status: DC
  Administered 2023-08-26 – 2023-09-02 (×8): 40 mg via ORAL
  Filled 2023-08-26 (×2): qty 4
  Filled 2023-08-26: qty 2
  Filled 2023-08-26: qty 4
  Filled 2023-08-26: qty 2
  Filled 2023-08-26 (×4): qty 4

## 2023-08-26 MED ORDER — FLUTICASONE PROPIONATE 50 MCG/ACT NA SUSP: 2.0000 | Freq: Every day | NASAL | Status: DC

## 2023-08-26 MED ORDER — SODIUM CHLORIDE 0.9 % IV SOLN
500.0000 mg | Freq: Once | INTRAVENOUS | Status: AC
  Administered 2023-08-26: 500 mg via INTRAVENOUS
  Filled 2023-08-26: qty 5

## 2023-08-26 MED ORDER — TAMSULOSIN HCL 0.4 MG PO CAPS
0.4000 mg | ORAL_CAPSULE | Freq: Every day | ORAL | Status: DC
  Administered 2023-08-26 – 2023-09-02 (×8): 0.4 mg via ORAL
  Filled 2023-08-26 (×8): qty 1

## 2023-08-26 MED ORDER — HEPARIN SODIUM (PORCINE) 5000 UNIT/ML IJ SOLN
4000.0000 [IU] | Freq: Once | INTRAMUSCULAR | Status: AC
  Administered 2023-08-26: 4000 [IU] via INTRAVENOUS
  Filled 2023-08-26: qty 1

## 2023-08-26 MED ORDER — ALPRAZOLAM 0.25 MG PO TABS: 0.2500 mg | ORAL_TABLET | Freq: Two times a day (BID) | ORAL | Status: DC | PRN

## 2023-08-26 MED ORDER — GABAPENTIN 100 MG PO CAPS
100.0000 mg | ORAL_CAPSULE | Freq: Three times a day (TID) | ORAL | Status: DC
Start: 2023-08-26 — End: 2023-09-02
  Administered 2023-08-26 – 2023-09-02 (×23): 100 mg via ORAL
  Filled 2023-08-26 (×23): qty 1

## 2023-08-26 MED ORDER — ASPIRIN 81 MG PO CHEW
324.0000 mg | CHEWABLE_TABLET | ORAL | Status: AC
  Administered 2023-08-26: 324 mg via ORAL
  Filled 2023-08-26: qty 4

## 2023-08-26 MED ORDER — POTASSIUM CHLORIDE CRYS ER 20 MEQ PO TBCR: 40.0000 meq | EXTENDED_RELEASE_TABLET | Freq: Every day | ORAL | 0 refills | Status: DC

## 2023-08-26 MED ORDER — HEPARIN (PORCINE) 25000 UT/250ML-% IV SOLN: 14.0000 [IU]/kg/h | INTRAVENOUS | Status: DC

## 2023-08-26 MED ORDER — ASPIRIN 81 MG PO TBEC: 81.0000 mg | DELAYED_RELEASE_TABLET | Freq: Every day | ORAL | Status: DC

## 2023-08-26 MED ORDER — SODIUM CHLORIDE 0.9 % IV SOLN: 500.0000 mg | INTRAVENOUS | Status: DC

## 2023-08-26 MED ORDER — SODIUM CHLORIDE 0.9 % IV BOLUS
1000.0000 mL | Freq: Once | INTRAVENOUS | Status: AC
  Administered 2023-08-26: 1000 mL via INTRAVENOUS

## 2023-08-26 MED ORDER — TRAZODONE HCL 50 MG PO TABS: 25.0000 mg | ORAL_TABLET | Freq: Every evening | ORAL | Status: DC | PRN

## 2023-08-26 MED ORDER — OYSTER SHELL CALCIUM/D3 500-5 MG-MCG PO TABS
2.0000 | ORAL_TABLET | Freq: Three times a day (TID) | ORAL | Status: DC
  Administered 2023-08-26 – 2023-09-02 (×22): 2 via ORAL
  Filled 2023-08-26 (×23): qty 2

## 2023-08-26 NOTE — Assessment & Plan Note (Signed)
-   The patient will be placed on IV sodium bicarbonate infusion.

## 2023-08-26 NOTE — ED Notes (Signed)
Pt stating he had urinated a little, brief changed.

## 2023-08-26 NOTE — Consult Note (Signed)
PHARMACY - ANTICOAGULATION CONSULT NOTE  Pharmacy Consult for Heparin Indication: chest pain/ACS  Allergies  Allergen Reactions   Statins     Other Reaction(s): arthralgia (joint pain) Patient able to tolerate Crestor 10 mg   Hctz [Hydrochlorothiazide] Other (See Comments)    Extreme Cramping   Metformin And Related Diarrhea    Patient Measurements: Height: 5\' 11"  (180.3 cm) IBW/kg (Calculated) : 75.3 Heparin Dosing Weight: 105.4 kg  Vital Signs: Temp: 98.6 F (37 C) (02/11 0446) Temp Source: Oral (02/11 0446) BP: 117/96 (02/11 0500) Pulse Rate: 97 (02/11 0500)  Labs: Recent Labs    08/25/23 1156 08/25/23 2130 08/25/23 2130 08/25/23 2321 08/26/23 0024 08/26/23 0226 08/26/23 0457  HGB  --  14.5  --   --   --   --  13.7  HCT  --  42.4  --   --   --   --  41.6  PLT  --  181  --   --   --   --  167  APTT  --   --   --   --  33  --   --   LABPROT  --   --   --   --  14.4  --   --   INR  --   --   --   --  1.1  --   --   HEPARINUNFRC  --   --   --   --   --   --  0.44  CREATININE 0.74 0.79  --   --   --   --  0.53*  TROPONINIHS  --  89*   < > 272*  --  576* 607*   < > = values in this interval not displayed.    Estimated Creatinine Clearance: 137.5 mL/min (A) (by C-G formula based on SCr of 0.53 mg/dL (L)).   Medical History: Past Medical History:  Diagnosis Date   Diabetes mellitus    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 type 2 diabetes mellitus, limited to breakdown of skin (HCC) 04/23/2019   Fracture of one rib, left side, subsequent encounter for fracture with routine healing 08/27/2020   Hyperlipidemia    Hypertension    Normal cardiac stress test 2012   [facility]    Medications:  No history of chronic anticoagulant use PTA  Assessment: 58yo patient presents to the ED via [facility] from home. Pt arrives with fever, AMS, N/V/D, abdominal pain, and increased urination. He was seen by his doctor today and was at his baseline mental status at that  time but subsequent became confused. The patient himself states he feels relatively okay. Troponin levels of 89>272 and EKG showing ST elevation. Pharmacy has been consulted to initiate and monitor continuous heparin infusion.  Baseline labs: aPTT 33sec, INR 1.1, Plts 181, Hgb 14.5  Goal of Therapy:  Heparin level 0.3-0.7 units/ml Monitor platelets by anticoagulation protocol: Yes   Plan: Continue heparin infusion at 1500 units/hr Re-check heparin level in 6 hours Monitor CBC daily for patients on heparin infusion  Will M. Dareen Piano, [name] Clinical Pharmacist 08/26/2023 7:21 AM

## 2023-08-26 NOTE — ED Notes (Signed)
Pt cleaned of stool, placed on bedpan due to pt saying he may have more to come. This is pt's 3rd liquid BM today.

## 2023-08-26 NOTE — ED Notes (Signed)
Update given to patient/family

## 2023-08-26 NOTE — Assessment & Plan Note (Signed)
-  The patient will be placed on supplemental coverage with NovoLog. - We will continue basal coverage. - We will continue Neurontin.

## 2023-08-26 NOTE — Assessment & Plan Note (Addendum)
Will resume Flomax.

## 2023-08-26 NOTE — Assessment & Plan Note (Addendum)
-   Will continue antibiotic therapy with IV Rocephin and Zithromax. - He has influenza A and possibly secondary bacterial pneumonia. - Mucolytic therapy be provided as well as duo nebs q.i.d. and q.4 hours p.r.n. - We will follow blood cultures.

## 2023-08-26 NOTE — Consult Note (Cosign Needed Addendum)
[facility] CLINIC CARDIOLOGY CONSULT NOTE       Patient ID: Connor Ross MRN: [id number] DOB/AGE: [birth year] 58 y.o.  Admit date: 08/25/2023 Referring Physician Dr. Valente David Primary Physician Sherlene Shams, MD  Primary Cardiologist Dr. Toy Baker, PA Reason for Consultation Elevated troponin  HPI: Connor Ross is a 58 y.o. male  with a past medical history of non-STEMI coronary disease (s/p previous stents to prox and mid LAD in remote past, s/p PCI and stent to distal RCA and overlapping prox RCA 06/2022) diabetes, hypertension, obesity, hyperlipidemia who presented to the ED on 08/25/2023 for fever, vomiting, diarrhea. Troponins checked and found to be elevated. Cardiology was consulted for further evaluation.   Patient reports that over the last few days he is generally not been feeling well.  He endorses fever and chills, as well as vomiting and diarrhea.  He also became confused yesterday.  Given his symptoms he was brought to the ED for further evaluation.  In the ED notable for creatinine 0.74, potassium 3.1, sodium 132, hemoglobin 14.5, WBC 9.3.  Lactic acid normal at 1.4.  Found to be flu a positive.  Troponins were checked and trended 89 > 272 > 576 > 607 > 1071.  CT abdomen pelvis was concerning for right lower lobe pneumonia.  He was started on IV antibiotics and IV heparin in the ED.  At the time of my evaluation this morning patient is resting comfortably in hospital bed.  We discussed his recent symptoms in further detail.  States that he has had fever and chills as well as vomiting and diarrhea, and has felt achy for the last week or so.  He reports associated cough and shortness of breath.  He denies any episodes of chest pain or palpitations.  Also denies any exertional symptoms.  States he is feeling okay overall.  Remains on supplemental O2.  Review of systems complete and found to be negative unless listed above    Past Medical History:   Diagnosis Date   Diabetes mellitus    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 type 2 diabetes mellitus, limited to breakdown of skin (HCC) 04/23/2019   Fracture of one rib, left side, subsequent encounter for fracture with routine healing 08/27/2020   Hyperlipidemia    Hypertension    Normal cardiac stress test 2012   [facility]    Past Surgical History:  Procedure Laterality Date   CORONARY STENT INTERVENTION N/A 06/25/2022   Procedure: CORONARY STENT INTERVENTION;  Surgeon: Alwyn Pea, MD;  Location: [facility] INVASIVE CV LAB;  Service: Cardiovascular;  Laterality: N/A;   CORONARY STENT INTERVENTION N/A 06/26/2022   Procedure: CORONARY STENT INTERVENTION;  Surgeon: Alwyn Pea, MD;  Location: [facility] INVASIVE CV LAB;  Service: Cardiovascular;  Laterality: N/A;   EXOSTECTOMY Right 06/2019   5th MT head  ([facility])   LEFT HEART CATH AND CORONARY ANGIOGRAPHY N/A 06/25/2022   Procedure: LEFT HEART CATH AND CORONARY ANGIOGRAPHY WITH POSSIBLE STENT;  Surgeon: Alwyn Pea, MD;  Location: [facility] INVASIVE CV LAB;  Service: Cardiovascular;  Laterality: N/A;  12:30 following DC first case   LUMBAR DISC SURGERY      (Not in a hospital admission)  Social History   Socioeconomic History   Marital status: Married    Spouse name: Not on file   Number of children: Not on file   Years of education: Not on file   Highest education level: 12th grade  Occupational History  Not on file  Tobacco Use   Smoking status: Never   Smokeless tobacco: Never  Substance and Sexual Activity   Alcohol use: No   Drug use: No   Sexual activity: Not on file  Other Topics Concern   Not on file  Social History Narrative   Married   Social Drivers of Health   Financial Resource Strain: Medium Risk (06/30/2023)   Overall Financial Resource Strain (CARDIA)    Difficulty of Paying Living Expenses: Somewhat hard  Food Insecurity: No Food Insecurity (06/30/2023)   Hunger Vital Sign     Worried About Running Out of Food in the Last Year: Never true    Ran Out of Food in the Last Year: Never true  Transportation Needs: No Transportation Needs (06/30/2023)   PRAPARE - Administrator, Civil Service (Medical): No    Lack of Transportation (Non-Medical): No  Physical Activity: Insufficiently Active (06/30/2023)   Exercise Vital Sign    Days of Exercise per Week: 2 days    Minutes of Exercise per Session: 10 min  Stress: No Stress Concern Present (06/30/2023)   [employer] of Occupational Health - Occupational Stress Questionnaire    Feeling of Stress : Not at all  Social Connections: Moderately Integrated (06/30/2023)   Social Connection and Isolation Panel [NHANES]    Frequency of Communication with Friends and Family: More than three times a week    Frequency of Social Gatherings with Friends and Family: Twice a week    Attends Religious Services: 1 to 4 times per year    Active Member of [employer] or Organizations: No    Attends Banker Meetings: Never    Marital Status: Married  Catering manager Violence: Not At Risk (01/21/2023)   Humiliation, Afraid, Rape, and Kick questionnaire    Fear of Current or Ex-Partner: No    Emotionally Abused: No    Physically Abused: No    Sexually Abused: No    Family History  Problem Relation Age of Onset   Coronary artery disease Mother 42   COPD Mother    Heart disease Mother    Hyperlipidemia Mother    Hypertension Mother    Cancer Father    Hyperlipidemia Father    Hypertension Brother    Cancer Brother        lymphoma   Diabetes Maternal Aunt    Diabetes Maternal Uncle    Diabetes Maternal Grandmother    Heart disease Maternal Grandmother      Vitals:   08/26/23 0500 08/26/23 0730 08/26/23 0800 08/26/23 0830  BP: (!) 117/96 134/72 129/66 (!) 142/68  Pulse: 97 (!) 101 98 (!) 101  Resp: 17   18  Temp:      TempSrc:      SpO2: 100% 100% 96% 100%  Height:        PHYSICAL EXAM General:  Ill appearing male, well nourished, in no acute distress. HEENT: Normocephalic and atraumatic. Neck: No JVD.  Lungs: Normal respiratory effort on 3L [place]. Clear bilaterally to auscultation. No wheezes, crackles, rhonchi.  Heart: HRRR, fast rate. Normal S1 and S2 without gallops or murmurs.  Abdomen: Non-distended appearing.  Msk: Normal strength and tone for age. Extremities: Warm and well perfused. No clubbing, cyanosis. No edema.  Neuro: Alert and oriented X 3. Psych: Answers questions appropriately.   Labs: Basic Metabolic Panel: Recent Labs    08/25/23 2130 08/26/23 0457  NA 131* 139  K 2.9* 4.4  CL 96*  115*  CO2 18* 14*  GLUCOSE 154* 79  BUN 12 9  CREATININE 0.79 0.53*  CALCIUM 8.4* 5.6*   Liver Function Tests: Recent Labs    08/25/23 1156 08/25/23 2130  AST 20 22  ALT 33 33  ALKPHOS 63 54  BILITOT 0.6 1.2  PROT 7.4 7.2  ALBUMIN 4.3 3.8   Recent Labs    08/25/23 1156 08/25/23 2130  LIPASE 28.0 24   CBC: Recent Labs    08/25/23 2130 08/26/23 0457  WBC 9.3 12.7*  NEUTROABS 8.1*  --   HGB 14.5 13.7  HCT 42.4 41.6  MCV 90.8 93.3  PLT 181 167   Cardiac Enzymes: Recent Labs    08/26/23 0226 08/26/23 0457 08/26/23 0829  TROPONINIHS 576* 607* 1,071*   BNP: No results for input(s): "BNP" in the last 72 hours. D-Dimer: No results for input(s): "DDIMER" in the last 72 hours. Hemoglobin A1C: No results for input(s): "HGBA1C" in the last 72 hours. Fasting Lipid Panel: Recent Labs    08/26/23 0458  CHOL 73  HDL 17*  LDLCALC 39  TRIG 83  CHOLHDL 4.3   Thyroid Function Tests: No results for input(s): "TSH", "T4TOTAL", "T3FREE", "THYROIDAB" in the last 72 hours.  Invalid input(s): "FREET3" Anemia Panel: No results for input(s): "VITAMINB12", "FOLATE", "FERRITIN", "TIBC", "IRON", "RETICCTPCT" in the last 72 hours.   Radiology: CT ABDOMEN PELVIS W CONTRAST Result Date: 08/25/2023 CLINICAL DATA:  Fever, nausea/vomiting/diarrhea, abdominal pain,  polyuria EXAM: CT ABDOMEN AND PELVIS WITH CONTRAST TECHNIQUE: Multidetector CT imaging of the abdomen and pelvis was performed using the standard protocol following bolus administration of intravenous contrast. RADIATION DOSE REDUCTION: This exam was performed according to the departmental dose-optimization program which includes automated exposure control, adjustment of the mA and/or kV according to patient size and/or use of iterative reconstruction technique. CONTRAST:  OMNIPAQUE IOHEXOL 350 MG/ML SOLN COMPARISON:  04/03/2012 FINDINGS: Lower chest: There is minimal subpleural right lower lobe airspace disease, which could reflect focal pneumonia. No effusion. Hepatobiliary: No focal liver abnormality is seen. No gallstones, gallbladder wall thickening, or biliary dilatation. Pancreas: Unremarkable. No pancreatic ductal dilatation or surrounding inflammatory changes. Spleen: Normal in size without focal abnormality. Adrenals/Urinary Tract: The kidneys enhance normally and symmetrically. No urinary tract calculi or obstructive uropathy within either kidney. Bladder is minimally distended, with no filling defects. The adrenals are normal. Stomach/Bowel: No bowel obstruction or ileus. Normal appendix right lower quadrant. Scattered diverticulosis of the sigmoid colon without diverticulitis. No bowel wall thickening or inflammatory change. Vascular/Lymphatic: Aortic atherosclerosis. No enlarged abdominal or pelvic lymph nodes. Reproductive: Prostate is unremarkable. Other: No free fluid or free intraperitoneal gas. No abdominal wall hernia. Musculoskeletal: No acute or destructive bony abnormalities. Bilateral hip osteoarthritis, left greater than right. Reconstructed images demonstrate no additional findings. IMPRESSION: 1. Patchy subpleural right lower lobe airspace disease, consistent with focal bronchopneumonia. 2. No acute intra-abdominal or intrapelvic process. 3.  Aortic Atherosclerosis (ICD10-I70.0).  Electronically Signed   By: Sharlet Salina M.D.   On: 08/25/2023 23:08   DG Chest Port 1 View Result Date: 08/25/2023 CLINICAL DATA:  Sepsis, abdominal pain, polyuria, diabetes EXAM: PORTABLE CHEST 1 VIEW COMPARISON:  06/23/2022 FINDINGS: Single frontal view of the chest demonstrates an enlarged cardiac silhouette. Lung volumes are diminished, with crowding of the central vasculature. No effusion or pneumothorax. No acute bony abnormalities. IMPRESSION: 1. Low lung volumes, with crowding of the pulmonary vasculature. 2. Enlarged cardiac silhouette. Electronically Signed   By: Maxwell Caul.D.  On: 08/25/2023 22:45   CT Head Wo Contrast Result Date: 08/25/2023 CLINICAL DATA:  Fever. Mental status change of unknown cause. Diabetic. EXAM: CT HEAD WITHOUT CONTRAST TECHNIQUE: Contiguous axial images were obtained from the base of the skull through the vertex without intravenous contrast. RADIATION DOSE REDUCTION: This exam was performed according to the departmental dose-optimization program which includes automated exposure control, adjustment of the mA and/or kV according to patient size and/or use of iterative reconstruction technique. COMPARISON:  None Available. FINDINGS: Brain: The study suffers from motion degradation. With repeat imaging, a satisfactory exam is achieved. No sign of old or acute focal infarction, mass lesion, hemorrhage, hydrocephalus or extra-axial collection. Vascular: There is atherosclerotic calcification of the major vessels at the base of the brain. Skull: Negative Sinuses/Orbits: Apparent soft tissue swelling of the right posterior parietal scalp, presumed hematoma. Orbits negative. Other: None IMPRESSION: No acute intracranial finding. Apparent soft tissue swelling of the right posterior parietal scalp, presumed hematoma. Electronically Signed   By: Paulina Fusi M.D.   On: 08/25/2023 22:39    ECHO ordered  TELEMETRY reviewed by me 08/26/2023: sinus tachycardia rate  100s  EKG reviewed by me: sinus tachycardia rate 101 bpm  Data reviewed by me 08/26/2023: last 24h vitals tele labs imaging I/O ED provider note, admission H&P  Principal Problem:   NSTEMI (non-ST elevated myocardial infarction) (HCC) Active Problems:   Sepsis due to pneumonia (HCC)   Hypokalemia   Metabolic acidosis   Type 2 diabetes mellitus with peripheral neuropathy (HCC)   Essential hypertension   BPH (benign prostatic hyperplasia)   Influenza A   Hypocalcemia    ASSESSMENT AND PLAN:  Connor Ross is a 58 y.o. male  with a past medical history of non-STEMI coronary disease (s/p previous stents to prox and mid LAD in remote past, s/p PCI and stent to distal RCA and overlapping prox RCA 06/2022) diabetes, hypertension, obesity, hyperlipidemia who presented to the ED on 08/25/2023 for fever, vomiting, diarrhea. Troponins checked and found to be elevated. Cardiology was consulted for further evaluation.   # NSTEMI # Coronary artery disease # Hypertension Patient with history of coronary disease with prior stenting in 06/2022 found to have elevated troponins upon admission.  No reports of chest pain symptoms.  Troponins trended 89 > 272 > 576 > 607 > 1071.  -Echo ordered. -Continue to trend troponins.  Continue heparin infusion. -S/p ASA 325 mg in the ED.  Continue rosuvastatin 40 mg daily, aspirin 81 mg daily, Plavix 75 mg daily. -Continue losartan 100 mg daily, metoprolol titrate 50 mg twice daily. -No plan for further cardiac diagnostics at this time but will continue to trend troponin and make further decisions based on this.  # Influenza A infection # Pneumonia Patient presented to the ED with fever/chills, vomiting, diarrhea.  Reports feeling poorly for roughly 1 week.  Flu a positive in the ED.  CTA abdomen concerning for right lower lobe pneumonia. -Further management per primary.   TIMI Risk Score for Unstable Angina or Non-ST Elevation MI:   The patient's TIMI  risk score is 4, which indicates a 20% risk of all cause mortality, new or recurrent myocardial infarction or need for urgent revascularization in the next 14 days.   ADDENDUM: Troponins peaked this afternoon at 1200 and now downtrending. Discussed with patient in detail. Given positive blood cultures, will defer cardiac catheterization and treat medically. Patient expressed understanding and agreement. Remains without chest pain.  This patient's plan of care was  discussed and created with Dr. Melton Alar and she is in agreement.  Signed: Gale Journey, PA-C  08/26/2023, 9:10 AM [facility] Cardiology

## 2023-08-26 NOTE — H&P (Addendum)
[place]   PATIENT NAME: Connor Ross    MR#:  [id number]  DATE OF BIRTH:  29-May-1966  DATE OF ADMISSION:  08/25/2023  PRIMARY CARE PHYSICIAN: Sherlene Shams, MD   Patient is coming from: Home  REQUESTING/REFERRING PHYSICIAN: Chiquita Loth, MD  CHIEF COMPLAINT:   Chief Complaint  Patient presents with   Fever   Emesis   Diarrhea    HISTORY OF PRESENT ILLNESS:  Connor Ross is a 58 y.o. male with medical history significant for type diabetes mellitus, hypertension and dyslipidemia, who presented to the emergency room with acute onset of fever with altered mental status.  The patient was having nausea and vomiting as well as diarrhea per EMS in addition to fever.  He was sent by his PCP today and was initially at his baseline mental status but later became confused.  No headache or dizziness or blurred vision no paresthesias or focal muscle weakness.  No chest pain or palpitations.  No dysuria, oliguria or hematuria or flank pain.  He reported having cough to his PCP that started on Wednesday.  He also admitted to associated wheezing without significant dyspnea.  ED Course: When he came to the ER, heart rate was 101 with respiratory to 19 and later 28 and he was afebrile.  Labs revealed mild hyponatremia and hypochloremia with hypokalemia and high sensitive troponin I was 89 and later 272.  Lactic acid 1.4 and later 1.1 and CBC was unremarkable.  Coag profile was within normal.  Respiratory panel came back positive for influenza A and was otherwise unremarkable.  Blood cultures were drawn.  EKG as reviewed by me : Initial EKG showed sinus tachycardia with a rate of 136 and later it came down to 101. Imaging: Noncontrast head CT scan revealed no acute intracranial normalities.  It apparently showed soft tissue swelling of the right posterior parietal scalp presumably hematoma.  Abdominal and pelvic CT scan revealed patchy subpleural right lower lobe airspace disease  consistent with focal bronchopneumonia and aortic atherosclerosis with no acute intra-abdominal or intrapelvic process.  The patient was given IV Rocephin and Zithromax, a gram of IV Tylenol, IV heparin and 1 L bolus of IV normal saline. PAST MEDICAL HISTORY:   Past Medical History:  Diagnosis Date   Diabetes mellitus    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 type 2 diabetes mellitus, limited to breakdown of skin (HCC) 04/23/2019   Fracture of one rib, left side, subsequent encounter for fracture with routine healing 08/27/2020   Hyperlipidemia    Hypertension    Normal cardiac stress test 2012   [facility]    PAST SURGICAL HISTORY:   Past Surgical History:  Procedure Laterality Date   CORONARY STENT INTERVENTION N/A 06/25/2022   Procedure: CORONARY STENT INTERVENTION;  Surgeon: Alwyn Pea, MD;  Location: [facility] INVASIVE CV LAB;  Service: Cardiovascular;  Laterality: N/A;   CORONARY STENT INTERVENTION N/A 06/26/2022   Procedure: CORONARY STENT INTERVENTION;  Surgeon: Alwyn Pea, MD;  Location: [facility] INVASIVE CV LAB;  Service: Cardiovascular;  Laterality: N/A;   EXOSTECTOMY Right 06/2019   5th MT head  ([facility])   LEFT HEART CATH AND CORONARY ANGIOGRAPHY N/A 06/25/2022   Procedure: LEFT HEART CATH AND CORONARY ANGIOGRAPHY WITH POSSIBLE STENT;  Surgeon: Alwyn Pea, MD;  Location: [facility] INVASIVE CV LAB;  Service: Cardiovascular;  Laterality: N/A;  12:30 following DC first case   LUMBAR DISC SURGERY  SOCIAL HISTORY:   Social History   Tobacco Use   Smoking status: Never   Smokeless tobacco: Never  Substance Use Topics   Alcohol use: No    FAMILY HISTORY:   Family History  Problem Relation Age of Onset   Coronary artery disease Mother 41   COPD Mother    Heart disease Mother    Hyperlipidemia Mother    Hypertension Mother    Cancer Father    Hyperlipidemia Father    Hypertension Brother    Cancer Brother        lymphoma   Diabetes  Maternal Aunt    Diabetes Maternal Uncle    Diabetes Maternal Grandmother    Heart disease Maternal Grandmother     DRUG ALLERGIES:   Allergies  Allergen Reactions   Statins     Other Reaction(s): arthralgia (joint pain) Patient able to tolerate Crestor 10 mg   Hctz [Hydrochlorothiazide] Other (See Comments)    Extreme Cramping   Metformin And Related Diarrhea    REVIEW OF SYSTEMS:   ROS As per history of present illness. All pertinent systems were reviewed above. Constitutional, HEENT, cardiovascular, respiratory, GI, GU, musculoskeletal, neuro, psychiatric, endocrine, integumentary and hematologic systems were reviewed and are otherwise negative/unremarkable except for positive findings mentioned above in the HPI.   MEDICATIONS AT HOME:   Prior to Admission medications   Medication Sig Start Date End Date Taking? Authorizing Provider  ASPIRIN LOW DOSE 81 MG EC tablet TAKE 1 TABLET BY MOUTH  DAILY (SWALLOW WHOLE) 11/08/19  Yes Sherlene Shams, MD  empagliflozin (JARDIANCE) 25 MG TABS tablet Take 1 tablet (25 mg total) by mouth daily. 07/17/23  Yes Sherlene Shams, MD  fluticasone (FLONASE) 50 MCG/ACT nasal spray PLACE 2 SPRAYS INTO BOTH NOSTRILS DAILY. AT NIGHT AFTER NASAL SALINE 12/24/21  Yes Sherlene Shams, MD  gabapentin (NEURONTIN) 100 MG capsule TAKE 2 CAPSULES THREE TIMES DAILY 11/18/22  Yes Sherlene Shams, MD  insulin aspart (NOVOLOG FLEXPEN) 100 UNIT/ML FlexPen Inject up to 25 units with breakfast, 25 units with lunch, and 30 units with supper 04/17/20  Yes Sherlene Shams, MD  insulin degludec (TRESIBA FLEXTOUCH) 200 UNIT/ML FlexTouch Pen Inject 88 Units into the skin daily. Patient taking differently: Inject 86 Units into the skin daily. 03/24/20  Yes Sherlene Shams, MD  ketoconazole (NIZORAL) 2 % cream SMARTSIG:1 Application Topical 1 to 2 Times Daily 04/01/22  Yes [provider]  Multiple Vitamin (MULTIVITAMIN) tablet Take 1 tablet by mouth daily.   Yes [provider]  tamsulosin (FLOMAX) 0.4 MG CAPS capsule TAKE 1 CAPSULE EVERY DAY 01/06/23  Yes Sherlene Shams, MD  traMADol (ULTRAM) 50 MG tablet Take 1 tablet (50 mg total) by mouth every 6 (six) hours as needed. 04/02/23  Yes Sherlene Shams, MD  triamcinolone (KENALOG) 0.025 % cream Apply topically. 04/01/22  Yes [provider]  Upadacitinib ER (RINVOQ) 15 MG TB24 Take 15 mg by mouth daily.   Yes [provider]  Vitamin D, Ergocalciferol, (DRISDOL) 1.25 MG (50000 UNIT) CAPS capsule Take 50,000 Units by mouth once a week. 10/18/22  Yes [provider]  blood glucose meter kit and supplies Dispense based on patient and insurance preference. Use up to four times daily as directed. (FOR ICD-10 E10.9, E11.9). Use to check blood sugar three times daily 04/21/18   Sherlene Shams, MD  cefadroxil (DURICEF) 500 MG capsule Take 1 capsule (500 mg total) by mouth 2 (two)  times daily. Patient not taking: Reported on 08/26/2023 08/25/23   Glori Luis, MD  clobetasol cream (TEMOVATE) 0.05 % Apply topically 2 (two) times daily. Patient not taking: Reported on 08/26/2023 02/28/22   [provider]  clopidogrel (PLAVIX) 75 MG tablet Take 75 mg by mouth daily. Patient not taking: Reported on 08/26/2023    [provider]  Continuous Blood Gluc Sensor (FREESTYLE LIBRE 2 SENSOR) MISC Use to check sugar at least 4 times daily 03/13/20   Sherlene Shams, MD  furosemide (LASIX) 20 MG tablet TAKE 1 TABLET EVERY OTHER DAY Patient not taking: Reported on 08/26/2023 08/13/23   Sherlene Shams, MD  Insulin Pen Needle 32G X 6 MM MISC Use daily with insulin pen 05/14/13   Sherlene Shams, MD  Lancets (FREESTYLE) lancets 1 each by Other route 2 (two) times daily. DX 250.02 08/16/11   Sherlene Shams, MD  losartan (COZAAR) 100 MG tablet TAKE 1 TABLET EVERY DAY 07/11/23   Sherlene Shams, MD  metoprolol tartrate (LOPRESSOR) 50 MG tablet Take 1 tablet (50 mg total) by mouth 2 (two) times  daily. Patient not taking: Reported on 08/26/2023 07/04/23 12/31/23  Sherlene Shams, MD  ondansetron (ZOFRAN-ODT) 4 MG disintegrating tablet Take 1 tablet (4 mg total) by mouth every 8 (eight) hours as needed for nausea or vomiting. Patient not taking: Reported on 08/26/2023 08/25/23   Glori Luis, MD  [facility] VERIO test strip USE TO CHECK BLOOD SUGAR 3  TIMES DAILY 05/03/19   Sherlene Shams, MD  rosuvastatin (CRESTOR) 40 MG tablet Take 1 tablet (40 mg total) by mouth every evening for 360 doses. Patient not taking: Reported on 08/26/2023 07/04/23 06/28/24  Sherlene Shams, MD  Semaglutide, 2 MG/DOSE, (OZEMPIC, 2 MG/DOSE,) 8 MG/3ML SOPN Inject 2 mg into the skin once a week. Patient not taking: Reported on 08/26/2023 12/05/20   Sherlene Shams, MD      VITAL SIGNS:  Blood pressure (!) 117/96, pulse 97, temperature 98.6 F (37 C), temperature source Oral, resp. rate 17, height 5\' 11"  (1.803 m), SpO2 100%.  PHYSICAL EXAMINATION:  Physical Exam  GENERAL:  58 y.o.-year-old male patient lying in the bed with no acute distress.  EYES: Pupils equal, round, reactive to light and accommodation. No scleral icterus. Extraocular muscles intact.  HEENT: Head atraumatic, normocephalic. Oropharynx and nasopharynx clear.  NECK:  Supple, no jugular venous distention. No thyroid enlargement, no tenderness.  LUNGS: Slightly diminished right base of breath sounds with right basal crackles.  No use of accessory muscles of respiration.  CARDIOVASCULAR: Regular rate and rhythm, S1, S2 normal. No murmurs, rubs, or gallops.  ABDOMEN: Soft, nondistended, nontender. Bowel sounds present. No organomegaly or mass.  EXTREMITIES: No pedal edema, cyanosis, or clubbing.  NEUROLOGIC: Cranial nerves II through XII are intact. Muscle strength 5/5 in all extremities. Sensation intact. Gait not checked.  PSYCHIATRIC: The patient is alert and oriented x 3.  Normal affect and good eye contact. SKIN: No obvious rash, lesion,  or ulcer.   LABORATORY PANEL:   CBC Recent Labs  Lab 08/26/23 0457  WBC 12.7*  HGB 13.7  HCT 41.6  PLT 167   ------------------------------------------------------------------------------------------------------------------  Chemistries  Recent Labs  Lab 08/25/23 2130 08/26/23 0457  NA 131* 139  K 2.9* 4.4  CL 96* 115*  CO2 18* 14*  GLUCOSE 154* 79  BUN 12 9  CREATININE 0.79 0.53*  CALCIUM 8.4* 5.6*  AST 22  --  ALT 33  --   ALKPHOS 54  --   BILITOT 1.2  --    ------------------------------------------------------------------------------------------------------------------  Cardiac Enzymes No results for input(s): "TROPONINI" in the last 168 hours. ------------------------------------------------------------------------------------------------------------------  RADIOLOGY:  CT ABDOMEN PELVIS W CONTRAST Result Date: 08/25/2023 CLINICAL DATA:  Fever, nausea/vomiting/diarrhea, abdominal pain, polyuria EXAM: CT ABDOMEN AND PELVIS WITH CONTRAST TECHNIQUE: Multidetector CT imaging of the abdomen and pelvis was performed using the standard protocol following bolus administration of intravenous contrast. RADIATION DOSE REDUCTION: This exam was performed according to the departmental dose-optimization program which includes automated exposure control, adjustment of the mA and/or kV according to patient size and/or use of iterative reconstruction technique. CONTRAST:  OMNIPAQUE IOHEXOL 350 MG/ML SOLN COMPARISON:  04/03/2012 FINDINGS: Lower chest: There is minimal subpleural right lower lobe airspace disease, which could reflect focal pneumonia. No effusion. Hepatobiliary: No focal liver abnormality is seen. No gallstones, gallbladder wall thickening, or biliary dilatation. Pancreas: Unremarkable. No pancreatic ductal dilatation or surrounding inflammatory changes. Spleen: Normal in size without focal abnormality. Adrenals/Urinary Tract: The kidneys enhance normally and  symmetrically. No urinary tract calculi or obstructive uropathy within either kidney. Bladder is minimally distended, with no filling defects. The adrenals are normal. Stomach/Bowel: No bowel obstruction or ileus. Normal appendix right lower quadrant. Scattered diverticulosis of the sigmoid colon without diverticulitis. No bowel wall thickening or inflammatory change. Vascular/Lymphatic: Aortic atherosclerosis. No enlarged abdominal or pelvic lymph nodes. Reproductive: Prostate is unremarkable. Other: No free fluid or free intraperitoneal gas. No abdominal wall hernia. Musculoskeletal: No acute or destructive bony abnormalities. Bilateral hip osteoarthritis, left greater than right. Reconstructed images demonstrate no additional findings. IMPRESSION: 1. Patchy subpleural right lower lobe airspace disease, consistent with focal bronchopneumonia. 2. No acute intra-abdominal or intrapelvic process. 3.  Aortic Atherosclerosis (ICD10-I70.0). Electronically Signed   By: Sharlet Salina M.D.   On: 08/25/2023 23:08   DG Chest Port 1 View Result Date: 08/25/2023 CLINICAL DATA:  Sepsis, abdominal pain, polyuria, diabetes EXAM: PORTABLE CHEST 1 VIEW COMPARISON:  06/23/2022 FINDINGS: Single frontal view of the chest demonstrates an enlarged cardiac silhouette. Lung volumes are diminished, with crowding of the central vasculature. No effusion or pneumothorax. No acute bony abnormalities. IMPRESSION: 1. Low lung volumes, with crowding of the pulmonary vasculature. 2. Enlarged cardiac silhouette. Electronically Signed   By: Sharlet Salina M.D.   On: 08/25/2023 22:45   CT Head Wo Contrast Result Date: 08/25/2023 CLINICAL DATA:  Fever. Mental status change of unknown cause. Diabetic. EXAM: CT HEAD WITHOUT CONTRAST TECHNIQUE: Contiguous axial images were obtained from the base of the skull through the vertex without intravenous contrast. RADIATION DOSE REDUCTION: This exam was performed according to the departmental  dose-optimization program which includes automated exposure control, adjustment of the mA and/or kV according to patient size and/or use of iterative reconstruction technique. COMPARISON:  None Available. FINDINGS: Brain: The study suffers from motion degradation. With repeat imaging, a satisfactory exam is achieved. No sign of old or acute focal infarction, mass lesion, hemorrhage, hydrocephalus or extra-axial collection. Vascular: There is atherosclerotic calcification of the major vessels at the base of the brain. Skull: Negative Sinuses/Orbits: Apparent soft tissue swelling of the right posterior parietal scalp, presumed hematoma. Orbits negative. Other: None IMPRESSION: No acute intracranial finding. Apparent soft tissue swelling of the right posterior parietal scalp, presumed hematoma. Electronically Signed   By: Paulina Fusi M.D.   On: 08/25/2023 22:39      IMPRESSION AND PLAN:  Assessment and Plan: * NSTEMI (non-ST elevated myocardial infarction) (  HCC) - The patient will be admitted to a progressive unit bed. - Will follow serial troponins and EKGs. - We will continue IV heparin. - The patient will be placed on aspirin as well as p.r.n. sublingual nitroglycerin and morphine sulfate for pain. - High-dose statin therapy be provided.  The patient has underlying dyslipidemia as well. - We will continue beta-blocker therapy. - Differential diagnosis would include demand ischemia. - We will obtain a cardiology consult in a.m. for further cardiac risk stratification. - I notified Dr. Melton Alar about the patient.   Sepsis due to pneumonia (HCC) - Will continue antibiotic therapy with IV Rocephin and Zithromax. - He has influenza A and possibly secondary bacterial pneumonia. - Mucolytic therapy be provided as well as duo nebs q.i.d. and q.4 hours p.r.n. - We will follow blood cultures.   Hypokalemia - Potassium will be replaced and followed.  Metabolic acidosis - The patient will be  placed on IV sodium bicarbonate infusion.  BPH (benign prostatic hyperplasia) - Will resume Flomax.  Essential hypertension - We will continue antihypertensive therapy.  Type 2 diabetes mellitus with peripheral neuropathy (HCC) - The patient will be placed on supplemental coverage with NovoLog. - We will continue basal coverage. - We will continue Neurontin.       DVT prophylaxis: IV heparin Advanced Care Planning:  Code Status: full code. Family Communication:  The plan of care was discussed in details with the patient (and family). I answered all questions. The patient agreed to proceed with the above mentioned plan. Further management will depend upon hospital course. Disposition Plan: Back to previous home environment Consults called: Cardiology All the records are reviewed and case discussed with ED provider.  Status is: Inpatient    At the time of the admission, it appears that the appropriate admission status for this patient is inpatient.  This is judged to be reasonable and necessary in order to provide the required intensity of service to ensure the patient's safety given the presenting symptoms, physical exam findings and initial radiographic and laboratory data in the context of comorbid conditions.  The patient requires inpatient status due to high intensity of service, high risk of further deterioration and high frequency of surveillance required.  I certify that at the time of admission, it is my clinical judgment that the patient will require inpatient hospital care extending more than 2 midnights.                            Dispo: The patient is from: Home              Anticipated d/c is to: Home              Patient currently is not medically stable to d/c.              Difficult to place patient: No  Hannah Beat M.D on 08/26/2023 at 6:41 AM  [facility]   From 7 PM-7 AM, contact night-coverage [email or web address]  CC: Primary care physician; Sherlene Shams, MD

## 2023-08-26 NOTE — Consult Note (Signed)
PHARMACY - ANTICOAGULATION CONSULT NOTE  Pharmacy Consult for Heparin Indication: chest pain/ACS  Allergies  Allergen Reactions   Statins     Other Reaction(s): arthralgia (joint pain) Patient able to tolerate Crestor 10 mg   Hctz [Hydrochlorothiazide] Other (See Comments)    Extreme Cramping   Metformin And Related Diarrhea    Patient Measurements: Height: 5\' 11"  (180.3 cm) IBW/kg (Calculated) : 75.3 Heparin Dosing Weight: 105.4 kg  Vital Signs: Temp: 98.8 F (37.1 C) (02/11 0020) Temp Source: Oral (02/11 0020) BP: 120/65 (02/10 2300) Pulse Rate: 101 (02/10 2300)  Labs: Recent Labs    08/25/23 1156 08/25/23 2130 08/25/23 2321 08/26/23 0024  HGB  --  14.5  --   --   HCT  --  42.4  --   --   PLT  --  181  --   --   APTT  --   --   --  33  LABPROT  --   --   --  14.4  INR  --   --   --  1.1  CREATININE 0.74 0.79  --   --   TROPONINIHS  --  89* 272*  --     Estimated Creatinine Clearance: 137.5 mL/min (by C-G formula based on SCr of 0.79 mg/dL).   Medical History: Past Medical History:  Diagnosis Date   Diabetes mellitus    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 type 2 diabetes mellitus, limited to breakdown of skin (HCC) 04/23/2019   Fracture of one rib, left side, subsequent encounter for fracture with routine healing 08/27/2020   Hyperlipidemia    Hypertension    Normal cardiac stress test 2012   [facility]    Medications:  No history of chronic anticoagulant use PTA  Assessment: 57yo patient presents to the ED via [facility] from home. Pt arrives with fever, AMS, N/V/D, abdominal pain, and increased urination. He was seen by his doctor today and was at his baseline mental status at that time but subsequent became confused. The patient himself states he feels relatively okay. Troponin levels of 89>272 and EKG showing ST elevation. Pharmacy has been consulted to initiate and monitor continuous heparin infusion.  Baseline labs: aPTT 33sec, INR 1.1, Plts  181, Hgb 14.5  Goal of Therapy:  Heparin level 0.3-0.7 units/ml Monitor platelets by anticoagulation protocol: Yes   Plan:  Give 4000 units bolus x 1 Start heparin infusion at 1500 units/hr Check anti-Xa level in 6 hours and daily while on heparin Continue to monitor H&H and platelets  Alynah Schone A Sameul Tagle 08/26/2023,1:25 AM

## 2023-08-26 NOTE — ED Notes (Signed)
Pt having dry heaves, medicated PRN.

## 2023-08-26 NOTE — Hospital Course (Addendum)
Connor Ross is a 58 y.o. male with medical history significant for type diabetes mellitus, hypertension and dyslipidemia, who presented to the emergency room with acute onset of fever with altered mental status  Patient was found to have tachycardia, tachypnea.  No leukocytosis.  Influenza was positive, blood culture was positive in 1 bottle with Streptococcus.  She was placed on Rocephin.  Patient also had elevated troponin peaked at 1226, seen by cardiology and placed on heparin drip.

## 2023-08-26 NOTE — Progress Notes (Signed)
Progress Note   Patient: Connor Ross:[id number] DOB: 1966/04/10 DOA: 08/25/2023     0 DOS: the patient was seen and examined on 08/26/2023   Brief hospital course: Connor Ross is a 58 y.o. male with medical history significant for type diabetes mellitus, hypertension and dyslipidemia, who presented to the emergency room with acute onset of fever with altered mental status  Patient was found to have tachycardia, tachypnea.  No leukocytosis.  Influenza was positive, blood culture was positive in 1 bottle with Streptococcus.  She was placed on Rocephin.  Patient also had elevated troponin peaked at 1226, seen by cardiology and placed on heparin drip.   Principal Problem:   NSTEMI (non-ST elevated myocardial infarction) (HCC) Active Problems:   Sepsis due to pneumonia (HCC)   Hypokalemia   Metabolic acidosis   Type 2 diabetes mellitus with peripheral neuropathy (HCC)   Essential hypertension   BPH (benign prostatic hyperplasia)   Influenza A   Hypocalcemia   Septicemia due to group D Streptococcus (HCC)   Assessment and Plan:  * NSTEMI (non-ST elevated myocardial infarction) (HCC) Elevated troponin in the setting of influenza and septicemia.  However, troponin elevated more than 1000.  As result, most likely has non-STEMI.  Patient has been seen by cardiology, continue heparin drip.  Sepsis due to pneumonia (HCC) Influenza. Streptococcus septicemia. Left hand cellulitis. Patient did meet sepsis criteria at time of admission with tachycardia and tachypnea.  Chest x-ray and CT scan also showed left lower lobe airspace disease consistent with pneumonia. Patient blood culture positive for Streptococcus agalactia.  The urine culture performed on 09/2022 also positive for Streptococcus agalactia.  Patient also has left hand swelling and red, states that he might had a spider bite.  Obtain x-ray. He has multiple reasons for bacteremia, but exact etiology is unclear.   Patient may need additional workup to rule out endocarditis.  Will obtain ID consult. Continue Rocephin for now. Patient is symptom of the flu started last Wednesday, out of the window for Tamiflu.  Hypokalemia Metabolic acidosis. Potassium have normalized, started bicarb drip.  Metabolic acidosis - The patient will be placed on IV sodium bicarbonate infusion.  BPH (benign prostatic hyperplasia) - Will resume Flomax.  Essential hypertension - We will continue antihypertensive therapy.  Type 2 diabetes mellitus with peripheral neuropathy (HCC) - The patient will be placed on supplemental coverage with NovoLog. - We will continue basal coverage. - We will continue Neurontin.  Morbid obesity with BMI 40.47.   Diet and excise advised.  Debility. Patient was severely debilitated at home, largely due to joint arthritis.  Obtain PT/OT.    Subjective:  Patient currently doing well denies any short of breath or cough.  Complaining of left hand swelling.  Physical Exam: Vitals:   08/26/23 1000 08/26/23 1043 08/26/23 1130 08/26/23 1200  BP: (!) 146/62  (!) 120/56 (!) 130/57  Pulse: 99  91 90  Resp: 18   17  Temp:  99.3 F (37.4 C)    TempSrc:  Oral    SpO2: 100%  100% 99%  Height:       General exam: Appears calm and comfortable, morbid obese. Respiratory system: Clear to auscultation. Respiratory effort normal. Cardiovascular system: S1 & S2 heard, RRR. No JVD, murmurs, rubs, gallops or clicks. No pedal edema. Gastrointestinal system: Abdomen is nondistended, soft and nontender. No organomegaly or masses felt. Normal bowel sounds heard. Central nervous system: Alert and oriented. No focal neurological deficits. Extremities: Left Hand redness  swelling. Skin: No rashes, lesions or ulcers Psychiatry: Judgement and insight appear normal. Mood & affect appropriate.    Data Reviewed:  Reviewed CT scan results, chest x-ray results and CT head results.  Family Communication:  Wife updated at bedside.  Disposition: Status is: Inpatient Remains inpatient appropriate because: Severity of disease, IV treatment.     Time spent: no charge minutes  Author: Marrion Coy, MD 08/26/2023 1:23 PM  For on call review [email or web address].

## 2023-08-26 NOTE — ED Notes (Signed)
Incontinence care provided, pt urinates, brief changed and pt repositioned.

## 2023-08-26 NOTE — Consult Note (Signed)
PHARMACY - ANTICOAGULATION CONSULT NOTE  Pharmacy Consult for Heparin Indication: chest pain/ACS  Allergies  Allergen Reactions   Statins     Other Reaction(s): arthralgia (joint pain) Patient able to tolerate Crestor 10 mg   Hctz [Hydrochlorothiazide] Other (See Comments)    Extreme Cramping   Metformin And Related Diarrhea    Patient Measurements: Height: 5\' 11"  (180.3 cm) IBW/kg (Calculated) : 75.3 Heparin Dosing Weight: 105.4 kg  Vital Signs: Temp: 99.3 F (37.4 C) (02/11 1544) Temp Source: Oral (02/11 1544) BP: 124/63 (02/11 1520) Pulse Rate: 89 (02/11 1520)  Labs: Recent Labs    08/25/23 1156 08/25/23 2130 08/25/23 2321 08/26/23 0024 08/26/23 0226 08/26/23 0457 08/26/23 0829 08/26/23 1120 08/26/23 1250 08/26/23 1443  HGB  --  14.5  --   --   --  13.7  --   --   --   --   HCT  --  42.4  --   --   --  41.6  --   --   --   --   PLT  --  181  --   --   --  167  --   --   --   --   APTT  --   --   --  33  --   --   --   --   --   --   LABPROT  --   --   --  14.4  --   --   --   --   --   --   INR  --   --   --  1.1  --   --   --   --   --   --   HEPARINUNFRC  --   --   --   --   --  0.44  --   --   --  0.33  CREATININE 0.74 0.79  --   --   --  0.53*  --   --   --   --   TROPONINIHS  --  89*   < >  --    < > 607* 1,071* 1,226* 817*  --    < > = values in this interval not displayed.    Estimated Creatinine Clearance: 137.5 mL/min (A) (by C-G formula based on SCr of 0.53 mg/dL (L)).   Medical History: Past Medical History:  Diagnosis Date   Diabetes mellitus    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 type 2 diabetes mellitus, limited to breakdown of skin (HCC) 04/23/2019   Fracture of one rib, left side, subsequent encounter for fracture with routine healing 08/27/2020   Hyperlipidemia    Hypertension    Normal cardiac stress test 2012   [facility]    Medications:  No history of chronic anticoagulant use PTA  Assessment: 57yo patient presents  to the ED via [facility] from home. Pt arrives with fever, AMS, N/V/D, abdominal pain, and increased urination. He was seen by his doctor today and was at his baseline mental status at that time but subsequent became confused. The patient himself states he feels relatively okay. Troponin levels of 89>272 and EKG showing ST elevation. Pharmacy has been consulted to initiate and monitor continuous heparin infusion.  Baseline labs: aPTT 33sec, INR 1.1, Plts 181, Hgb 14.5  Date Time HL Rate/Comment  2/11 0457 0.44 Therapeutic x1 Confirmatory heparin level drawn early this AM, repeat sample was hemolyzed, third sample sent late  2/11 1443 0.33 Therapeutic x2  No issues with infusion reported, no signs/symptoms of bleeding noted.   Goal of Therapy:  Heparin level 0.3-0.7 units/ml Monitor platelets by anticoagulation protocol: Yes   Plan: Continue heparin infusion at 1500 units/hr Monitor daily heparin levels while on heparin infusion Monitor CBC and signs/symptoms of bleeding  Thank you for involving pharmacy in this patient's care.   Rockwell Alexandria, [name] Clinical Pharmacist 08/26/2023 3:47 PM

## 2023-08-26 NOTE — ED Provider Notes (Signed)
-----------------------------------------   12:02 AM on 08/26/2023 -----------------------------------------   Elevated troponin noted.  CT abdomen/pelvis positive for pneumonia.  Patient's lactic acid is negative.  Will initiate IV antibiotics, heparin bolus/drip, replete potassium via IV.  Will consult hospitalist services for evaluation and admission.   Irean Hong, MD 08/26/23 [phone]

## 2023-08-26 NOTE — ED Notes (Signed)
Pt cleaned of urine and stool. Very liquid stool. Sample sent to lab.

## 2023-08-26 NOTE — Consult Note (Incomplete)
NAME: Connor Ross  DOB: Dec 02, 1965  MRN: [id number]  Date/Time: 08/26/2023 6:18 PM  REQUESTING PROVIDER: Dr. Chipper Herb Subjective:  REASON FOR CONSULT: Group B streptococcus bacteremia ? TAHJAY Ross is a 58 y.o. with a history of DM, HTN, CAD s/p LAD/RCA  stents,Hyperlipidemia presented tot he ED on 08/25/23 with fever and altered mental status. HE was also having N/V/D  HE had seen his PCP in the morning and was thought to have a viral illness, covid and Flu neg at that time.HE was also c/o pain /swelling left hand. As per wife patient has been feeling unwell since Thursday.  She started with headache and cough and then patient started to have nausea and vomiting patient has been feeling unwell since Thursday.  She started with headache and cough and then patient started to have nausea and vomiting for the next 2- 3 days.  Then he developed low-grade fever (PCP on 08/25/2023.  He noted swelling and pain in the left hand which the patient was not aware how we got it. Patient does not work He has a dog at home.  But no scratches or bites  08/25/23 21:07  BP 156/66 !  Temp 103.3 F (39.6 C) !  Pulse Rate 122 !  Resp 18  SpO2 95 %    Latest Reference Range & Units 08/25/23 21:30  WBC 4.0 - 10.5 K/uL 9.3  Hemoglobin 13.0 - 17.0 g/dL 21.3  HCT 08.6 - 57.8 % 42.4  Platelets 150 - 400 K/uL 181  Creatinine 0.61 - 1.24 mg/dL 4.69  Blood culture was sent Chest x-ray Showed enlarged cardiac silhouette with low lung volumes with crowding of the pulmonary space. CT abdomen showed patchy subpleural right lower lobe airspace disease consistent with bronchopneumonia.  No acute findings in the CT abdomen. CT head did not show anything acute I am asked to see the patient for the bacteremia. On questioning patient does finger sticks on his right left fingers. He has some pain in the left dorsum and wrist.  Past Medical History:  Diagnosis Date   Diabetes mellitus    Diabetic ulcer of  toe of left foot associated with type 2 diabetes mellitus, limited to breakdown of skin (HCC) 04/23/2019   Fracture of one rib, left side, subsequent encounter for fracture with routine healing 08/27/2020   Hyperlipidemia    Hypertension    Normal cardiac stress test 2012   [facility]    Past Surgical History:  Procedure Laterality Date   CORONARY STENT INTERVENTION N/A 06/25/2022   Procedure: CORONARY STENT INTERVENTION;  Surgeon: Alwyn Pea, MD;  Location: [facility];  Service: Cardiovascular;  Laterality: N/A;   CORONARY STENT INTERVENTION N/A 06/26/2022   Procedure: CORONARY STENT INTERVENTION;  Surgeon: Alwyn Pea, MD;  Location: [facility];  Service: Cardiovascular;  Laterality: N/A;   EXOSTECTOMY Right 06/2019   5th MT head  ([facility])   LEFT HEART CATH AND CORONARY ANGIOGRAPHY N/A 06/25/2022   Procedure: LEFT HEART CATH AND CORONARY ANGIOGRAPHY WITH POSSIBLE STENT;  Surgeon: Alwyn Pea, MD;  Location: [facility];  Service: Cardiovascular;  Laterality: N/A;  12:30 following DC first case   LUMBAR DISC SURGERY      Social History   Socioeconomic History   Marital status: Married    Spouse name: Not on file   Number of children: Not on file   Years of education: Not on file   Highest education level: 12th grade  Occupational History   Not on file  Tobacco Use   Smoking status: Never   Smokeless tobacco: Never  Substance and Sexual Activity   Alcohol use: No   Drug use: No   Sexual activity: Not on file  Other Topics Concern   Not on file  Social History Narrative   Married   Social Drivers of Health   Financial Resource Strain: Medium Risk (06/30/2023)   Overall Financial Resource Strain (CARDIA)    Difficulty of Paying Living Expenses: Somewhat hard  Food Insecurity: No Food Insecurity (06/30/2023)   Hunger Vital Sign    Worried About Running Out of Food in the Last Year: Never true    Ran Out of Food in the Last  Year: Never true  Transportation Needs: No Transportation Needs (06/30/2023)   PRAPARE - Administrator, Civil Service (Medical): No    Lack of Transportation (Non-Medical): No  Physical Activity: Insufficiently Active (06/30/2023)   Exercise Vital Sign    Days of Exercise per Week: 2 days    Minutes of Exercise per Session: 10 min  Stress: No Stress Concern Present (06/30/2023)   [employer] of Occupational Health - Occupational Stress Questionnaire    Feeling of Stress : Not at all  Social Connections: Moderately Integrated (06/30/2023)   Social Connection and Isolation Panel [NHANES]    Frequency of Communication with Friends and Family: More than three times a week    Frequency of Social Gatherings with Friends and Family: Twice a week    Attends Religious Services: 1 to 4 times per year    Active Member of [employer] or Organizations: No    Attends Banker Meetings: Never    Marital Status: Married  Catering manager Violence: Not At Risk (01/21/2023)   Humiliation, Afraid, Rape, and Kick questionnaire    Fear of Current or Ex-Partner: No    Emotionally Abused: No    Physically Abused: No    Sexually Abused: No    Family History  Problem Relation Age of Onset   Coronary artery disease Mother 31   COPD Mother    Heart disease Mother    Hyperlipidemia Mother    Hypertension Mother    Cancer Father    Hyperlipidemia Father    Hypertension Brother    Cancer Brother        lymphoma   Diabetes Maternal Aunt    Diabetes Maternal Uncle    Diabetes Maternal Grandmother    Heart disease Maternal Grandmother    Allergies  Allergen Reactions   Statins     Other Reaction(s): arthralgia (joint pain) Patient able to tolerate Crestor 10 mg   Hctz [Hydrochlorothiazide] Other (See Comments)    Extreme Cramping   Metformin And Related Diarrhea   I? Current Facility-Administered Medications  Medication Dose Route Frequency Provider Last Rate Last Admin    acetaminophen (TYLENOL) tablet 650 mg  650 mg Oral Q4H PRN Mansy, Jan A, MD       ALPRAZolam Prudy Feeler) tablet 0.25 mg  0.25 mg Oral BID PRN Mansy, Jan A, MD       aspirin EC tablet 81 mg  81 mg Oral Daily Mansy, Jan A, MD   81 mg at 08/26/23 0940   calcium-vitamin D (OSCAL WITH D) 500-5 MG-MCG per tablet 2 tablet  2 tablet Oral TID Marrion Coy, MD   2 tablet at 08/26/23 1541   cefTRIAXone (ROCEPHIN) 2 g in sodium chloride 0.9 % 100 mL IVPB  2 g Intravenous Q24H Aleda Grana, [place]   Stopped at 08/26/23 1415   chlorpheniramine-HYDROcodone (TUSSIONEX) 10-8 MG/5ML suspension 5 mL  5 mL Oral Q12H PRN Mansy, Jan A, MD       clopidogrel (PLAVIX) tablet 75 mg  75 mg Oral Daily Mansy, Jan A, MD   75 mg at 08/26/23 0941   empagliflozin (JARDIANCE) tablet 25 mg  25 mg Oral Daily Mansy, Jan A, MD   25 mg at 08/26/23 0941   furosemide (LASIX) tablet 20 mg  20 mg Oral QODAY Mansy, Jan A, MD   20 mg at 08/26/23 0939   gabapentin (NEURONTIN) capsule 100 mg  100 mg Oral TID Mansy, Jan A, MD   100 mg at 08/26/23 1541   guaiFENesin (MUCINEX) 12 hr tablet 600 mg  600 mg Oral BID Mansy, Jan A, MD   600 mg at 08/26/23 0940   heparin ADULT infusion 100 units/mL (25000 units/246mL)  1,500 Units/hr Intravenous Continuous Mila Merry A, RPH 15 mL/hr at 08/26/23 1748 1,500 Units/hr at 08/26/23 1748   ipratropium-albuterol (DUONEB) 0.5-2.5 (3) MG/3ML nebulizer solution 3 mL  3 mL Nebulization QID Mansy, Jan A, MD   3 mL at 08/26/23 1542   losartan (COZAAR) tablet 100 mg  100 mg Oral Daily Mansy, Jan A, MD   100 mg at 08/26/23 1610   magnesium hydroxide (MILK OF MAGNESIA) suspension 30 mL  30 mL Oral Daily PRN Mansy, Jan A, MD       metoprolol tartrate (LOPRESSOR) tablet 50 mg  50 mg Oral BID Mansy, Jan A, MD   50 mg at 08/26/23 0940   morphine (PF) 2 MG/ML injection 2 mg  2 mg Intravenous Q2H PRN Mansy, Jan A, MD       multivitamin with minerals tablet 1 tablet  1 tablet Oral Daily Mansy, Jan A, MD   1 tablet at 08/26/23  0939   nitroGLYCERIN (NITROSTAT) SL tablet 0.4 mg  0.4 mg Sublingual Q5 Min x 3 PRN Mansy, Jan A, MD       ondansetron [facility]) injection 4 mg  4 mg Intravenous Q6H PRN Mansy, Jan A, MD   4 mg at 08/26/23 1245   oseltamivir (TAMIFLU) capsule 75 mg  75 mg Oral BID Mansy, Jan A, MD   75 mg at 08/26/23 0941   rosuvastatin (CRESTOR) tablet 40 mg  40 mg Oral QPM Mansy, Jan A, MD   40 mg at 08/26/23 1751   sodium bicarbonate 150 mEq in dextrose 5 % 1,150 mL infusion   Intravenous Continuous Mansy, Jan A, MD 125 mL/hr at 08/26/23 1748 Infusion Verify at 08/26/23 1748   tamsulosin (FLOMAX) capsule 0.4 mg  0.4 mg Oral Daily Mansy, Jan A, MD   0.4 mg at 08/26/23 0940   traMADol (ULTRAM) tablet 50 mg  50 mg Oral Q6H PRN Mansy, Jan A, MD       traZODone (DESYREL) tablet 25 mg  25 mg Oral QHS PRN Mansy, Vernetta Honey, MD       Vitamin D (Ergocalciferol) (DRISDOL) 1.25 MG (50000 UNIT) capsule 50,000 Units  50,000 Units Oral Weekly Mansy, Jan A, MD   50,000 Units at 08/26/23 9604   Current Outpatient Medications  Medication Sig Dispense Refill   ASPIRIN LOW DOSE 81 MG EC tablet TAKE 1 TABLET BY MOUTH  DAILY (SWALLOW WHOLE) 90 tablet 1   empagliflozin (JARDIANCE) 25 MG TABS tablet Take 1 tablet (25 mg total) by mouth daily. 90 tablet 3  fluticasone (FLONASE) 50 MCG/ACT nasal spray PLACE 2 SPRAYS INTO BOTH NOSTRILS DAILY. AT NIGHT AFTER NASAL SALINE 16 g 2   gabapentin (NEURONTIN) 100 MG capsule TAKE 2 CAPSULES THREE TIMES DAILY 540 capsule 3   insulin aspart (NOVOLOG FLEXPEN) 100 UNIT/ML FlexPen Inject up to 25 units with breakfast, 25 units with lunch, and 30 units with supper 72 mL 2   insulin degludec (TRESIBA FLEXTOUCH) 200 UNIT/ML FlexTouch Pen Inject 88 Units into the skin daily. (Patient taking differently: Inject 86 Units into the skin daily.) 48 mL 2   ketoconazole (NIZORAL) 2 % cream SMARTSIG:1 Application Topical 1 to 2 Times Daily     Multiple Vitamin (MULTIVITAMIN) tablet Take 1 tablet by mouth daily.      tamsulosin (FLOMAX) 0.4 MG CAPS capsule TAKE 1 CAPSULE EVERY DAY 90 capsule 3   traMADol (ULTRAM) 50 MG tablet Take 1 tablet (50 mg total) by mouth every 6 (six) hours as needed. 360 tablet 1   triamcinolone (KENALOG) 0.025 % cream Apply topically.     Upadacitinib ER (RINVOQ) 15 MG TB24 Take 15 mg by mouth daily.     Vitamin D, Ergocalciferol, (DRISDOL) 1.25 MG (50000 UNIT) CAPS capsule Take 50,000 Units by mouth once a week.     blood glucose meter kit and supplies Dispense based on patient and insurance preference. Use up to four times daily as directed. (FOR ICD-10 E10.9, E11.9). Use to check blood sugar three times daily 1 each 0   cefadroxil (DURICEF) 500 MG capsule Take 1 capsule (500 mg total) by mouth 2 (two) times daily. (Patient not taking: Reported on 08/26/2023) 14 capsule 0   clobetasol cream (TEMOVATE) 0.05 % Apply topically 2 (two) times daily. (Patient not taking: Reported on 08/26/2023)     clopidogrel (PLAVIX) 75 MG tablet Take 75 mg by mouth daily. (Patient not taking: Reported on 08/26/2023)     Continuous Blood Gluc Sensor (FREESTYLE LIBRE 2 SENSOR) MISC Use to check sugar at least 4 times daily 1 each 2   furosemide (LASIX) 20 MG tablet TAKE 1 TABLET EVERY OTHER DAY (Patient not taking: Reported on 08/26/2023) 45 tablet 3   Insulin Pen Needle 32G X 6 MM MISC Use daily with insulin pen 50 each 0   Lancets (FREESTYLE) lancets 1 each by Other route 2 (two) times daily. DX 250.02 100 each 12   losartan (COZAAR) 100 MG tablet TAKE 1 TABLET EVERY DAY 90 tablet 3   metoprolol tartrate (LOPRESSOR) 50 MG tablet Take 1 tablet (50 mg total) by mouth 2 (two) times daily. (Patient not taking: Reported on 08/26/2023) 180 tablet 1   ondansetron (ZOFRAN-ODT) 4 MG disintegrating tablet Take 1 tablet (4 mg total) by mouth every 8 (eight) hours as needed for nausea or vomiting. (Patient not taking: Reported on 08/26/2023) 20 tablet 0   ONETOUCH VERIO test strip USE TO CHECK BLOOD SUGAR 3  TIMES DAILY  300 strip 3   potassium chloride SA (KLOR-CON M) 20 MEQ tablet Take 2 tablets (40 mEq total) by mouth daily for 3 days. 6 tablet 0   rosuvastatin (CRESTOR) 40 MG tablet Take 1 tablet (40 mg total) by mouth every evening for 360 doses. (Patient not taking: Reported on 08/26/2023) 90 tablet 3   Semaglutide, 2 MG/DOSE, (OZEMPIC, 2 MG/DOSE,) 8 MG/3ML SOPN Inject 2 mg into the skin once a week. (Patient not taking: Reported on 08/26/2023) 8 mL 2     Abtx:  Anti-infectives (From admission, onward)  Start     Dose/Rate Route Frequency Ordered Stop   08/27/23 0100  azithromycin (ZITHROMAX) 500 mg in sodium chloride 0.9 % 250 mL IVPB  Status:  Discontinued        500 mg 250 mL/hr over 60 Minutes Intravenous Every 24 hours 08/26/23 0140 08/26/23 1012   08/26/23 1400  cefTRIAXone (ROCEPHIN) 2 g in sodium chloride 0.9 % 100 mL IVPB        2 g 200 mL/hr over 30 Minutes Intravenous Every 24 hours 08/26/23 0140 09/05/23 1359   08/26/23 1000  oseltamivir (TAMIFLU) capsule 75 mg        75 mg Oral 2 times daily 08/26/23 0142 08/31/23 0959   08/26/23 0015  cefTRIAXone (ROCEPHIN) 1 g in sodium chloride 0.9 % 100 mL IVPB        1 g 200 mL/hr over 30 Minutes Intravenous  Once 08/26/23 0002 08/26/23 0046   08/26/23 0015  azithromycin (ZITHROMAX) 500 mg in sodium chloride 0.9 % 250 mL IVPB        500 mg 250 mL/hr over 60 Minutes Intravenous  Once 08/26/23 0002 08/26/23 0220       REVIEW OF SYSTEMS:  Const:  fever,  chills, negative weight loss Eyes: negative diplopia or visual changes, negative eye pain ENT: negative coryza, negative sore throat Resp:  cough, no hemoptysis, dyspnea Cards: negative for chest pain, palpitations, lower extremity edema GU: negative for frequency, dysuria and hematuria GI:  abdominal pain, diarrhea, nausea vomiting no bleeding, constipation Skin: Above Heme: negative for easy bruising and gum/nose bleeding MS: Weakness fatigue Neurolo: Confusion Psych: negative for  feelings of anxiety, depression  Endocrine:  diabetes Allergy/Immunology-statin, HCTZ and metformin Objective:  VITALS:  BP 127/73   Pulse 98   Temp 99.3 F (37.4 C) (Oral)   Resp 17   Ht 5\' 11"  (1.803 m)   SpO2 100%   BMI 40.47 kg/m   PHYSICAL EXAM:  General: He is awake, oriented in place and person some confusion with dates Head: Normocephalic, without obvious abnormality, atraumatic. Eyes: Conjunctivae clear, anicteric sclerae. Pupils are equal ENT Nares normal. No drainage or sinus tenderness. Lips, mucosa, and tongue normal. No Thrush Neck: Supple, symmetrical, no adenopathy, thyroid: non tender no carotid bruit and no JVD. Back: No CVA tenderness. Lungs: Bowel delayed entry. Few right basilar crackles Heart: Regular rate and rhythm, no murmur, rub or gallop. Abdomen: Soft, non-tender,not distended. Bowel sounds normal. No masses Extremities: Left arm near the wrist swollen Some erythema Some tenderness Multiple fingersticks seen  Skin: as above Lymph: Cervical, supraclavicular normal. Neurologic: Grossly non-focal Pertinent Labs Lab Results CBC    Component Value Date/Time   WBC 12.7 (H) 08/26/2023 0457   RBC 4.46 08/26/2023 0457   HGB 13.7 08/26/2023 0457   HGB 15.6 04/03/2012 0329   HCT 41.6 08/26/2023 0457   HCT 45.5 04/03/2012 0329   PLT 167 08/26/2023 0457   PLT 228 04/03/2012 0329   MCV 93.3 08/26/2023 0457   MCV 89 04/03/2012 0329   MCH 30.7 08/26/2023 0457   MCHC 32.9 08/26/2023 0457   RDW 13.4 08/26/2023 0457   RDW 14.4 04/03/2012 0329   LYMPHSABS 0.2 (L) 08/25/2023 2130   MONOABS 0.8 08/25/2023 2130   EOSABS 0.0 08/25/2023 2130   BASOSABS 0.0 08/25/2023 2130       Latest Ref Rng & Units 08/26/2023    4:57 AM 08/25/2023    9:30 PM 08/25/2023   11:56 AM  CMP  Glucose 70 -  99 mg/dL 79  161  096   BUN 6 - 20 mg/dL 9  12  11    Creatinine 0.61 - 1.24 mg/dL 0.45  4.09  8.11   Sodium 135 - 145 mmol/L 139  131  132   Potassium 3.5 - 5.1  mmol/L 4.4  2.9  3.1   Chloride 98 - 111 mmol/L 115  96  97   CO2 22 - 32 mmol/L 14  18  23    Calcium 8.9 - 10.3 mg/dL 5.6  8.4  8.7   Total Protein 6.5 - 8.1 g/dL  7.2  7.4   Total Bilirubin 0.0 - 1.2 mg/dL  1.2  0.6   Alkaline Phos 38 - 126 U/L  54  63   AST 15 - 41 U/L  22  20   ALT 0 - 44 U/L  33  33       Microbiology: Recent Results (from the past 240 hours)  Resp panel by RT-PCR (RSV, Flu A&B, Covid) Anterior Nasal Swab     Status: Abnormal   Collection Time: 08/25/23  9:30 PM   Specimen: Anterior Nasal Swab  Result Value Ref Range Status   SARS Coronavirus 2 by RT PCR NEGATIVE NEGATIVE Final    Comment: (NOTE) SARS-CoV-2 target nucleic acids are NOT DETECTED.  The SARS-CoV-2 RNA is generally detectable in upper respiratory specimens during the acute phase of infection. The lowest concentration of SARS-CoV-2 viral copies this assay can detect is 138 copies/mL. A negative result does not preclude SARS-Cov-2 infection and should not be used as the sole basis for treatment or other patient management decisions. A negative result may occur with  improper specimen collection/handling, submission of specimen other than nasopharyngeal swab, presence of viral mutation(s) within the areas targeted by this assay, and inadequate number of viral copies(<138 copies/mL). A negative result must be combined with clinical observations, patient history, and epidemiological information. The expected result is Negative.  Fact Sheet for Patients:  BloggerCourse.com  Fact Sheet for Healthcare Providers:  SeriousBroker.it  This test is no t yet approved or cleared by the [place] FDA and  has been authorized for detection and/or diagnosis of SARS-CoV-2 by FDA under an Emergency Use Authorization (EUA). This EUA will remain  in effect (meaning this test can be used) for the duration of the COVID-19 declaration under Section 564(b)(1)  of the Act, 21 U.S.C.section 360bbb-3(b)(1), unless the authorization is terminated  or revoked sooner.       Influenza A by PCR POSITIVE (A) NEGATIVE Final   Influenza B by PCR NEGATIVE NEGATIVE Final    Comment: (NOTE) The Xpert Xpress SARS-CoV-2/FLU/RSV plus assay is intended as an aid in the diagnosis of influenza from Nasopharyngeal swab specimens and should not be used as a sole basis for treatment. Nasal washings and aspirates are unacceptable for Xpert Xpress SARS-CoV-2/FLU/RSV testing.  Fact Sheet for Patients: BloggerCourse.com  Fact Sheet for Healthcare Providers: SeriousBroker.it  This test is not yet approved or cleared by the [place] FDA and has been authorized for detection and/or diagnosis of SARS-CoV-2 by FDA under an Emergency Use Authorization (EUA). This EUA will remain in effect (meaning this test can be used) for the duration of the COVID-19 declaration under Section 564(b)(1) of the Act, 21 U.S.C. section 360bbb-3(b)(1), unless the authorization is terminated or revoked.     Resp Syncytial Virus by PCR NEGATIVE NEGATIVE Final    Comment: (NOTE) Fact Sheet for Patients: BloggerCourse.com  Fact Sheet for Healthcare  Providers: SeriousBroker.it  This test is not yet approved or cleared by the [place] and has been authorized for detection and/or diagnosis of SARS-CoV-2 by FDA under an Emergency Use Authorization (EUA). This EUA will remain in effect (meaning this test can be used) for the duration of the COVID-19 declaration under Section 564(b)(1) of the Act, 21 U.S.C. section 360bbb-3(b)(1), unless the authorization is terminated or revoked.  Performed at [facility], [address], [place], [place] [address]   Blood Culture (routine x 2)     Status: None (Preliminary result)   Collection Time: 08/25/23  9:31 PM    Specimen: BLOOD LEFT FOREARM  Result Value Ref Range Status   Specimen Description   Final    BLOOD LEFT FOREARM Performed at [facility], [address], [place], [place] [address]    Special Requests   Final    BOTTLES DRAWN AEROBIC AND ANAEROBIC Blood Culture results may not be optimal due to an inadequate volume of blood received in culture bottles Performed at [facility], [address] [place], [place] [address]    Culture  Setup Time   Final    GRAM POSITIVE COCCI IN BOTH AEROBIC AND ANAEROBIC BOTTLES Organism ID to follow CRITICAL RESULT CALLED TO, READ BACK BY AND VERIFIED WITH: KLUTZ, L. [id number] 08/26/2023 LRL GRAM STAIN REVIEWED-AGREE WITH RESULT DRT Performed at [facility], [address] [place], [place] [address]    Culture GRAM POSITIVE COCCI  Final   Report Status PENDING  Incomplete  Blood Culture (routine x 2)     Status: None (Preliminary result)   Collection Time: 08/25/23  9:31 PM   Specimen: BLOOD LEFT FOREARM  Result Value Ref Range Status   Specimen Description   Final    BLOOD LEFT FOREARM Performed at [facility], [address], [place], [place] [address]    Special Requests   Final    BOTTLES DRAWN AEROBIC AND ANAEROBIC Blood Culture results may not be optimal due to an inadequate volume of blood received in culture bottles Performed at [facility], [address] [place], [place] [address]    Culture  Setup Time   Final    GRAM POSITIVE COCCI IN BOTH AEROBIC AND ANAEROBIC BOTTLES Organism ID to follow CRITICAL RESULT CALLED TO, READ BACK BY AND VERIFIED WITH: KLUTZ, L. [id number] 08/26/2023 LRL GRAM STAIN REVIEWED-AGREE WITH RESULT DRT Performed at [facility], [address] [place], [place] [address]    Culture GRAM POSITIVE COCCI  Final   Report Status PENDING  Incomplete  Blood Culture ID Panel (Reflexed)     Status: Abnormal   Collection Time: 08/25/23  9:31 PM  Result Value Ref  Range Status   Enterococcus faecalis NOT DETECTED NOT DETECTED Final   Enterococcus Faecium NOT DETECTED NOT DETECTED Final   Listeria monocytogenes NOT DETECTED NOT DETECTED Final   Staphylococcus species NOT DETECTED NOT DETECTED Final   Staphylococcus aureus (BCID) NOT DETECTED NOT DETECTED Final   Staphylococcus epidermidis NOT DETECTED NOT DETECTED Final   Staphylococcus lugdunensis NOT DETECTED NOT DETECTED Final   Streptococcus species DETECTED (A) NOT DETECTED Final    Comment: CRITICAL RESULT CALLED TO, READ BACK BY AND VERIFIED WITH: KLUTZ, L. [id number] 08/26/2023 LRL    Streptococcus agalactiae DETECTED (A) NOT DETECTED Final    Comment: CRITICAL RESULT CALLED TO, READ BACK BY AND VERIFIED WITH: KLUTZ, L. [id number] 08/26/2023 LRL    Streptococcus pneumoniae  NOT DETECTED NOT DETECTED Final   Streptococcus pyogenes NOT DETECTED NOT DETECTED Final   A.calcoaceticus-baumannii NOT DETECTED NOT DETECTED Final   Bacteroides fragilis NOT DETECTED NOT DETECTED Final   Enterobacterales NOT DETECTED NOT DETECTED Final   Enterobacter cloacae complex NOT DETECTED NOT DETECTED Final   Escherichia coli NOT DETECTED NOT DETECTED Final   Klebsiella aerogenes NOT DETECTED NOT DETECTED Final   Klebsiella oxytoca NOT DETECTED NOT DETECTED Final   Klebsiella pneumoniae NOT DETECTED NOT DETECTED Final   Proteus species NOT DETECTED NOT DETECTED Final   Salmonella species NOT DETECTED NOT DETECTED Final   Serratia marcescens NOT DETECTED NOT DETECTED Final   Haemophilus influenzae NOT DETECTED NOT DETECTED Final   Neisseria meningitidis NOT DETECTED NOT DETECTED Final   Pseudomonas aeruginosa NOT DETECTED NOT DETECTED Final   Stenotrophomonas maltophilia NOT DETECTED NOT DETECTED Final   Candida albicans NOT DETECTED NOT DETECTED Final   Candida auris NOT DETECTED NOT DETECTED Final   Candida glabrata NOT DETECTED NOT DETECTED Final   Candida krusei NOT DETECTED NOT DETECTED Final   Candida  parapsilosis NOT DETECTED NOT DETECTED Final   Candida tropicalis NOT DETECTED NOT DETECTED Final   Cryptococcus neoformans/gattii NOT DETECTED NOT DETECTED Final    Comment: Performed at [facility], [address] [place], [place] [address]  C Difficile Quick Screen w PCR reflex     Status: None   Collection Time: 08/26/23  2:46 PM   Specimen: STOOL  Result Value Ref Range Status   C Diff antigen NEGATIVE NEGATIVE Final   C Diff toxin NEGATIVE NEGATIVE Final   C Diff interpretation No C. difficile detected.  Final    Comment: Performed at [facility], [address] [place], [place] [address]    IMAGING RESULTS:  I have personally reviewed the films ?cardiomegaly Impression/Recommendation ? 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  Seen by cardiology. CAD status post stents  Discussed the management with the patient and his wife in detail ? _I have personally spent  60---minutes involved in face-to-face and non-face-to-face activities for this patient on the day of the visit. Professional time spent includes the following activities: Preparing to see the patient (review of tests), Obtaining and/or reviewing separately obtained history (admission/discharge record), Performing a medically appropriate examination and/or evaluation , Ordering medications/tests/procedures, referring and communicating  with other health care professionals, Documenting clinical information in the EMR, Independently interpreting results (not separately reported), Communicating results to the patient/family/caregiver, Counseling and educating the patient/family/caregiver and Care coordination (not separately reported).    ________________________________________________ Discussed with patient, requesting provider Note:  This document was prepared using Dragon voice recognition software and may include unintentional dictation errors.

## 2023-08-26 NOTE — Assessment & Plan Note (Addendum)
-   Potassium will be replaced and followed.

## 2023-08-26 NOTE — ED Notes (Addendum)
Pt cleaned of stool and urine. New brief and chuck pad placed, gown changed.

## 2023-08-26 NOTE — Assessment & Plan Note (Addendum)
-   The patient will be admitted to a progressive unit bed. - Will follow serial troponins and EKGs. - We will continue IV heparin. - The patient will be placed on aspirin as well as p.r.n. sublingual nitroglycerin and morphine sulfate for pain. - High-dose statin therapy be provided.  The patient has underlying dyslipidemia as well. - We will continue beta-blocker therapy. - Differential diagnosis would include demand ischemia. - We will obtain a cardiology consult in a.m. for further cardiac risk stratification. - I notified Dr. Melton Alar about the patient.

## 2023-08-26 NOTE — Progress Notes (Signed)
PHARMACY - PHYSICIAN COMMUNICATION CRITICAL VALUE ALERT - BLOOD CULTURE IDENTIFICATION (BCID)  Connor Ross is an 58 y.o. male who presented to [facility] on 08/25/2023 with a chief complaint of fever, N/V/D.   Assessment:  blood cultures from 2/10 with GPC in both sets, BCID detects S. Agalactiae (group B strep).   Name of physician (or Provider) Contacted: Dr Chipper Herb  Current antibiotics: Ceftriaxone and azithromycin   Changes to prescribed antibiotics recommended:  Patient is on recommended antibiotics - No changes needed - stop azithromycin   Results for orders placed or performed during the hospital encounter of 08/25/23  Blood Culture ID Panel (Reflexed) (Collected: 08/25/2023  9:31 PM)  Result Value Ref Range   Enterococcus faecalis NOT DETECTED NOT DETECTED   Enterococcus Faecium NOT DETECTED NOT DETECTED   Listeria monocytogenes NOT DETECTED NOT DETECTED   Staphylococcus species NOT DETECTED NOT DETECTED   Staphylococcus aureus (BCID) NOT DETECTED NOT DETECTED   Staphylococcus epidermidis NOT DETECTED NOT DETECTED   Staphylococcus lugdunensis NOT DETECTED NOT DETECTED   Streptococcus species DETECTED (A) NOT DETECTED   Streptococcus agalactiae DETECTED (A) NOT DETECTED   Streptococcus pneumoniae NOT DETECTED NOT DETECTED   Streptococcus pyogenes NOT DETECTED NOT DETECTED   A.calcoaceticus-baumannii NOT DETECTED NOT DETECTED   Bacteroides fragilis NOT DETECTED NOT DETECTED   Enterobacterales NOT DETECTED NOT DETECTED   Enterobacter cloacae complex NOT DETECTED NOT DETECTED   Escherichia coli NOT DETECTED NOT DETECTED   Klebsiella aerogenes NOT DETECTED NOT DETECTED   Klebsiella oxytoca NOT DETECTED NOT DETECTED   Klebsiella pneumoniae NOT DETECTED NOT DETECTED   Proteus species NOT DETECTED NOT DETECTED   Salmonella species NOT DETECTED NOT DETECTED   Serratia marcescens NOT DETECTED NOT DETECTED   Haemophilus influenzae NOT DETECTED NOT DETECTED   Neisseria  meningitidis NOT DETECTED NOT DETECTED   Pseudomonas aeruginosa NOT DETECTED NOT DETECTED   Stenotrophomonas maltophilia NOT DETECTED NOT DETECTED   Candida albicans NOT DETECTED NOT DETECTED   Candida auris NOT DETECTED NOT DETECTED   Candida glabrata NOT DETECTED NOT DETECTED   Candida krusei NOT DETECTED NOT DETECTED   Candida parapsilosis NOT DETECTED NOT DETECTED   Candida tropicalis NOT DETECTED NOT DETECTED   Cryptococcus neoformans/gattii NOT DETECTED NOT DETECTED    Juliette Alcide, [name], BCPS, BCIDP Work Cell: [phone] 08/26/2023 10:14 AM

## 2023-08-26 NOTE — Assessment & Plan Note (Signed)
-   We will continue antihypertensive therapy.

## 2023-08-26 NOTE — ED Notes (Signed)
Pt found to be incontinent of stool and urine. Incontinence care provided and new linens given. +large amount of loose stool. MD notified.

## 2023-08-27 ENCOUNTER — Inpatient Hospital Stay
Admit: 2023-08-27 | Discharge: 2023-08-27 | Disposition: A | Discharge status: Home / Self Care | Payer: Medicare HMO | Attending: Family Medicine | Admitting: Family Medicine

## 2023-08-27 ENCOUNTER — Encounter
Admission: EM | Disposition: A | Discharge status: Other Acute Inpatient Hospital | Payer: Self-pay | Source: Home / Self Care | Attending: Student

## 2023-08-27 ENCOUNTER — Other Ambulatory Visit: Payer: Self-pay | Admitting: Family Medicine

## 2023-08-27 DIAGNOSIS — B951 Streptococcus, group B, as the cause of diseases classified elsewhere: Secondary | ICD-10-CM | POA: Diagnosis not present

## 2023-08-27 DIAGNOSIS — A401 Sepsis due to streptococcus, group B: Secondary | ICD-10-CM

## 2023-08-27 DIAGNOSIS — I214 Non-ST elevation (NSTEMI) myocardial infarction: Secondary | ICD-10-CM | POA: Diagnosis not present

## 2023-08-27 DIAGNOSIS — R7881 Bacteremia: Secondary | ICD-10-CM

## 2023-08-27 DIAGNOSIS — J101 Influenza due to other identified influenza virus with other respiratory manifestations: Secondary | ICD-10-CM | POA: Diagnosis not present

## 2023-08-27 DIAGNOSIS — E876 Hypokalemia: Secondary | ICD-10-CM

## 2023-08-27 HISTORY — DX: Bacteremia: R78.81

## 2023-08-27 LAB — BASIC METABOLIC PANEL
Anion gap: 13 (ref 5–15)
Anion gap: 11 (ref 5–15)
BUN: 13 mg/dL (ref 6–20)
BUN: 13 mg/dL (ref 6–20)
CO2: 26 mmol/L (ref 22–32)
CO2: 25 mmol/L (ref 22–32)
Calcium: 8.9 mg/dL (ref 8.9–10.3)
Calcium: 8.1 mg/dL — ABNORMAL LOW (ref 8.9–10.3)
Chloride: 96 mmol/L — ABNORMAL LOW (ref 98–111)
Chloride: 97 mmol/L — ABNORMAL LOW (ref 98–111)
Creatinine, Ser: 0.69 mg/dL (ref 0.61–1.24)
Creatinine, Ser: 0.62 mg/dL (ref 0.61–1.24)
GFR, Estimated: >60 mL/min (ref >60)
GFR, Estimated: >60 mL/min (ref >60)
Glucose, Bld: 153 mg/dL — ABNORMAL HIGH (ref 70–99)
Glucose, Bld: 177 mg/dL — ABNORMAL HIGH (ref 70–99)
Potassium: 2.7 mmol/L — CL (ref 3.5–5.1)
Potassium: 3 mmol/L — ABNORMAL LOW (ref 3.5–5.1)
Sodium: 135 mmol/L (ref 135–145)
Sodium: 133 mmol/L — ABNORMAL LOW (ref 135–145)

## 2023-08-27 LAB — HEPARIN LEVEL (UNFRACTIONATED)
Heparin Unfractionated: >1.1 [IU]/mL — ABNORMAL HIGH (ref 0.30–0.70)
Heparin Unfractionated: <0.1 [IU]/mL — ABNORMAL LOW (ref 0.30–0.70)
Heparin Unfractionated: 0.11 [IU]/mL — ABNORMAL LOW (ref 0.30–0.70)

## 2023-08-27 LAB — MAGNESIUM: Magnesium: 2.3 mg/dL (ref 1.7–2.4)

## 2023-08-27 LAB — CBC
HCT: 39.2 % (ref 39.0–52.0)
Hemoglobin: 13.5 g/dL (ref 13.0–17.0)
MCH: 30.3 pg (ref 26.0–34.0)
MCHC: 34.4 g/dL (ref 30.0–36.0)
MCV: 88.1 fL (ref 80.0–100.0)
Platelets: 143 10*3/uL — ABNORMAL LOW (ref 150–400)
RBC: 4.45 MIL/uL (ref 4.22–5.81)
RDW: 13.6 % (ref 11.5–15.5)
WBC: 10.6 10*3/uL — ABNORMAL HIGH (ref 4.0–10.5)
nRBC: 0 % (ref 0.0–0.2)

## 2023-08-27 LAB — ECHOCARDIOGRAM COMPLETE
AR max vel: 2.16 cm2
AV Area VTI: 2.53 cm2
AV Area mean vel: 2.39 cm2
AV Mean grad: 6 mm[Hg]
AV Peak grad: 13 mm[Hg]
Ao pk vel: 1.8 m/s
Area-P 1/2: 3.5 cm2
Height: 71 in
MV VTI: 2.37 cm2
S' Lateral: 2.9 cm
Weight: 4643.2 [oz_av]

## 2023-08-27 LAB — APTT
aPTT: >200 s (ref 24–36)
aPTT: 34 s (ref 24–36)

## 2023-08-27 LAB — PHOSPHORUS: Phosphorus: 3.5 mg/dL (ref 2.5–4.6)

## 2023-08-27 SURGERY — LEFT HEART CATH AND CORONARY ANGIOGRAPHY
Anesthesia: Moderate Sedation

## 2023-08-27 MED ORDER — POTASSIUM CHLORIDE 10 MEQ/100ML IV SOLN
10.0000 meq | INTRAVENOUS | Status: AC
  Administered 2023-08-27 (×4): 10 meq via INTRAVENOUS
  Filled 2023-08-27 (×4): qty 100

## 2023-08-27 MED ORDER — HEPARIN BOLUS VIA INFUSION
3000.0000 [IU] | Freq: Once | INTRAVENOUS | Status: AC
  Administered 2023-08-27: 3000 [IU] via INTRAVENOUS
  Filled 2023-08-27: qty 3000

## 2023-08-27 MED ORDER — IPRATROPIUM-ALBUTEROL 0.5-2.5 (3) MG/3ML IN SOLN: 3.0000 mL | RESPIRATORY_TRACT | Status: DC | PRN

## 2023-08-27 MED ORDER — HEPARIN BOLUS VIA INFUSION
3000.0000 [IU] | Freq: Once | INTRAVENOUS | Status: AC
Start: 2023-08-27 — End: 2023-08-27
  Administered 2023-08-27: 3000 [IU] via INTRAVENOUS
  Filled 2023-08-27: qty 3000

## 2023-08-27 MED ORDER — POTASSIUM CHLORIDE 20 MEQ PO PACK
40.0000 meq | PACK | Freq: Once | ORAL | Status: AC
  Administered 2023-08-27: 40 meq via ORAL
  Filled 2023-08-27: qty 2

## 2023-08-27 MED ORDER — POTASSIUM CHLORIDE 10 MEQ/100ML IV SOLN
10.0000 meq | INTRAVENOUS | Status: DC
Start: 2023-08-27 — End: 2023-08-27
  Filled 2023-08-27 (×4): qty 100

## 2023-08-27 MED ORDER — PROTAMINE SULFATE 10 MG/ML IV SOLN
50.0000 mg | Freq: Once | INTRAVENOUS | Status: AC
  Administered 2023-08-27: 50 mg via INTRAVENOUS
  Filled 2023-08-27 (×2): qty 5

## 2023-08-27 MED ORDER — POTASSIUM CHLORIDE CRYS ER 20 MEQ PO TBCR
40.0000 meq | EXTENDED_RELEASE_TABLET | Freq: Once | ORAL | Status: AC
  Administered 2023-08-27: 40 meq via ORAL
  Filled 2023-08-27: qty 2

## 2023-08-27 MED ORDER — PERFLUTREN LIPID MICROSPHERE
1.0000 mL | INTRAVENOUS | Status: AC | PRN
  Administered 2023-08-27: 5 mL via INTRAVENOUS

## 2023-08-27 MED ORDER — HEPARIN (PORCINE) 25000 UT/250ML-% IV SOLN
2400.0000 [IU]/h | INTRAVENOUS | Status: DC
  Administered 2023-08-27: 1500 [IU]/h via INTRAVENOUS
  Administered 2023-08-28 (×2): 2400 [IU]/h via INTRAVENOUS
  Administered 2023-08-28: 2200 [IU]/h via INTRAVENOUS
  Administered 2023-08-28: 1600 [IU]/h via INTRAVENOUS
  Administered 2023-08-29: 2400 [IU]/h via INTRAVENOUS
  Filled 2023-08-27 (×5): qty 250

## 2023-08-27 NOTE — Consult Note (Signed)
PHARMACY - ANTICOAGULATION CONSULT NOTE  Pharmacy Consult for Heparin Indication: chest pain/ACS  Allergies  Allergen Reactions   Statins     Other Reaction(s): arthralgia (joint pain) Patient able to tolerate Crestor 10 mg   Hctz [Hydrochlorothiazide] Other (See Comments)    Extreme Cramping   Metformin And Related Diarrhea    Patient Measurements: Height: 5\' 11"  (180.3 cm) IBW/kg (Calculated) : 75.3 Heparin Dosing Weight: 105.4 kg  Vital Signs: Temp: 99.2 F (37.3 C) (02/12 1726) Temp Source: Oral (02/12 1726) BP: 133/74 (02/12 1726) Pulse Rate: 83 (02/12 1726)  Labs: Recent Labs    08/25/23 2130 08/25/23 2321 08/26/23 0024 08/26/23 0226 08/26/23 0457 08/26/23 0829 08/26/23 1120 08/26/23 1250 08/26/23 1443 08/26/23 2356 08/27/23 0251 08/27/23 0841 08/27/23 1409 08/27/23 1805  HGB 14.5  --   --   --  13.7  --   --   --   --   --  13.5  --   --   --   HCT 42.4  --   --   --  41.6  --   --   --   --   --  39.2  --   --   --   PLT 181  --   --   --  167  --   --   --   --   --  143*  --   --   --   APTT  --   --  33  --   --   --   --   --   --  >200* 34  --   --   --   LABPROT  --   --  14.4  --   --   --   --   --   --   --   --   --   --   --   INR  --   --  1.1  --   --   --   --   --   --   --   --   --   --   --   HEPARINUNFRC  --   --   --   --  0.44  --   --   --    < > >1.10*  --  <0.10*  --  0.11*  CREATININE 0.79  --   --   --  0.53*  --   --   --   --   --  0.69  --  0.62  --   TROPONINIHS 89*   < >  --    < > 607* 1,071* 1,226* 817*  --   --   --   --   --   --    < > = values in this interval not displayed.    Estimated Creatinine Clearance: 137.5 mL/min (by C-G formula based on SCr of 0.62 mg/dL).   Medical History: Past Medical History:  Diagnosis Date   Diabetes mellitus    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 type 2 diabetes mellitus, limited to breakdown of skin (HCC) 04/23/2019   Fracture of one rib, left side, subsequent  encounter for fracture with routine healing 08/27/2020   Hyperlipidemia    Hypertension    Normal cardiac stress test 2012   [facility]    Medications:  No history of chronic anticoagulant use PTA  Assessment: 58yo patient presents to the ED via [facility]  from home. Pt arrives with fever, AMS, N/V/D, abdominal pain, and increased urination. He was seen by his doctor today and was at his baseline mental status at that time but subsequent became confused. The patient himself states he feels relatively okay. Troponin levels of 89>272 and EKG showing ST elevation. Pharmacy has been consulted to initiate and monitor continuous heparin infusion.   Date Time HL Rate/Comment  2/11 0457 0.44 Therapeutic x1 at 1500un/hr, HL drawn early this AM, repeat sample was hemolyzed, third sample sent late 2/11 1443 0.33 Therapeutic x2 2/12 0100  Infusion HELD and protamine given, received 250,000 u in 2 hrs 2/12 1805 0.11 Subtherapeutic, Heparin resumed at 11:55, rate 1500 un/hr>1600  Goal of Therapy:  Heparin level 0.3-0.7 units/ml Monitor platelets by anticoagulation protocol: Yes   Plan: Bolus with heparin 3000 units IV x 1 Increase heparin infusion rate to 1600 units/hr (not following protocol after protamine dose given < 24 hrs ago and previously therapeutic at current rate). Repeat HL 6hrs after rate change. CBC daily while on heparin infusion.  Tulani Kidney Rodriguez-Guzman [name], BCPS 08/27/2023 6:50 PM

## 2023-08-27 NOTE — Telephone Encounter (Signed)
Patient is currently hospitalized so this does not need to be refilled.

## 2023-08-27 NOTE — Progress Notes (Signed)
[facility] CLINIC CARDIOLOGY PROGRESS NOTE       Patient ID: Connor Ross MRN: [id number] DOB/AGE: 1966-07-06 58 y.o.  Admit date: 08/25/2023 Referring Physician Dr. Valente David Primary Physician Sherlene Shams, MD  Primary Cardiologist Dr. Toy Baker, PA Reason for Consultation Elevated troponin  HPI: Connor Ross is a 58 y.o. male  with a past medical history of non-STEMI coronary disease (s/p previous stents to prox and mid LAD in remote past, s/p PCI and stent to distal RCA and overlapping prox RCA 06/2022) diabetes, hypertension, obesity, hyperlipidemia who presented to the ED on 08/25/2023 for fever, vomiting, diarrhea. Troponins checked and found to be elevated. Cardiology was consulted for further evaluation.   Interval history: -Patient reports that he is feeling well overall this morning. -Denies any significant shortness of breath symptoms but remains on 3L [place]. -Troponins peaked around 1200 yesterday and downtrending, blood culture positive for strep agalactiae.  He denies any chest pain or palpitation symptoms. -BP and heart rate remain well-controlled. -Potassium noted to be significantly low this morning, supplementation as ordered.  Review of systems complete and found to be negative unless listed above    Past Medical History:  Diagnosis Date   Diabetes mellitus    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 type 2 diabetes mellitus, limited to breakdown of skin (HCC) 04/23/2019   Fracture of one rib, left side, subsequent encounter for fracture with routine healing 08/27/2020   Hyperlipidemia    Hypertension    Normal cardiac stress test 2012   [facility]    Past Surgical History:  Procedure Laterality Date   CORONARY STENT INTERVENTION N/A 06/25/2022   Procedure: CORONARY STENT INTERVENTION;  Surgeon: Alwyn Pea, MD;  Location: [facility] INVASIVE CV LAB;  Service: Cardiovascular;  Laterality: N/A;   CORONARY STENT INTERVENTION N/A  06/26/2022   Procedure: CORONARY STENT INTERVENTION;  Surgeon: Alwyn Pea, MD;  Location: [facility] INVASIVE CV LAB;  Service: Cardiovascular;  Laterality: N/A;   EXOSTECTOMY Right 06/2019   5th MT head  ([facility])   LEFT HEART CATH AND CORONARY ANGIOGRAPHY N/A 06/25/2022   Procedure: LEFT HEART CATH AND CORONARY ANGIOGRAPHY WITH POSSIBLE STENT;  Surgeon: Alwyn Pea, MD;  Location: [facility] INVASIVE CV LAB;  Service: Cardiovascular;  Laterality: N/A;  12:30 following DC first case   LUMBAR DISC SURGERY      Medications Prior to Admission  Medication Sig Dispense Refill Last Dose/Taking   ASPIRIN LOW DOSE 81 MG EC tablet TAKE 1 TABLET BY MOUTH  DAILY (SWALLOW WHOLE) 90 tablet 1 08/24/2023   empagliflozin (JARDIANCE) 25 MG TABS tablet Take 1 tablet (25 mg total) by mouth daily. 90 tablet 3 08/24/2023   fluticasone (FLONASE) 50 MCG/ACT nasal spray PLACE 2 SPRAYS INTO BOTH NOSTRILS DAILY. AT NIGHT AFTER NASAL SALINE 16 g 2 Taking   gabapentin (NEURONTIN) 100 MG capsule TAKE 2 CAPSULES THREE TIMES DAILY 540 capsule 3 08/24/2023   insulin aspart (NOVOLOG FLEXPEN) 100 UNIT/ML FlexPen Inject up to 25 units with breakfast, 25 units with lunch, and 30 units with supper 72 mL 2 08/25/2023   insulin degludec (TRESIBA FLEXTOUCH) 200 UNIT/ML FlexTouch Pen Inject 88 Units into the skin daily. (Patient taking differently: Inject 86 Units into the skin daily.) 48 mL 2 08/25/2023   ketoconazole (NIZORAL) 2 % cream SMARTSIG:1 Application Topical 1 to 2 Times Daily   Past Week   Multiple Vitamin (MULTIVITAMIN) tablet Take 1 tablet by mouth daily.   08/24/2023  tamsulosin (FLOMAX) 0.4 MG CAPS capsule TAKE 1 CAPSULE EVERY DAY 90 capsule 3 08/24/2023   traMADol (ULTRAM) 50 MG tablet Take 1 tablet (50 mg total) by mouth every 6 (six) hours as needed. 360 tablet 1 Taking As Needed   triamcinolone (KENALOG) 0.025 % cream Apply topically.   08/24/2023   Upadacitinib ER (RINVOQ) 15 MG TB24 Take 15 mg by mouth daily.    08/24/2023   Vitamin D, Ergocalciferol, (DRISDOL) 1.25 MG (50000 UNIT) CAPS capsule Take 50,000 Units by mouth once a week.   Taking   blood glucose meter kit and supplies Dispense based on patient and insurance preference. Use up to four times daily as directed. (FOR ICD-10 E10.9, E11.9). Use to check blood sugar three times daily 1 each 0    cefadroxil (DURICEF) 500 MG capsule Take 1 capsule (500 mg total) by mouth 2 (two) times daily. (Patient not taking: Reported on 08/26/2023) 14 capsule 0 Not Taking   clobetasol cream (TEMOVATE) 0.05 % Apply topically 2 (two) times daily. (Patient not taking: Reported on 08/26/2023)   Not Taking   clopidogrel (PLAVIX) 75 MG tablet Take 75 mg by mouth daily. (Patient not taking: Reported on 08/26/2023)   Not Taking   Continuous Blood Gluc Sensor (FREESTYLE LIBRE 2 SENSOR) MISC Use to check sugar at least 4 times daily 1 each 2    furosemide (LASIX) 20 MG tablet TAKE 1 TABLET EVERY OTHER DAY (Patient not taking: Reported on 08/26/2023) 45 tablet 3 Not Taking   Insulin Pen Needle 32G X 6 MM MISC Use daily with insulin pen 50 each 0    Lancets (FREESTYLE) lancets 1 each by Other route 2 (two) times daily. DX 250.02 100 each 12    losartan (COZAAR) 100 MG tablet TAKE 1 TABLET EVERY DAY 90 tablet 3    metoprolol tartrate (LOPRESSOR) 50 MG tablet Take 1 tablet (50 mg total) by mouth 2 (two) times daily. (Patient not taking: Reported on 08/26/2023) 180 tablet 1 Not Taking   ondansetron (ZOFRAN-ODT) 4 MG disintegrating tablet Take 1 tablet (4 mg total) by mouth every 8 (eight) hours as needed for nausea or vomiting. (Patient not taking: Reported on 08/26/2023) 20 tablet 0 Not Taking   ONETOUCH VERIO test strip USE TO CHECK BLOOD SUGAR 3  TIMES DAILY 300 strip 3    potassium chloride SA (KLOR-CON M) 20 MEQ tablet Take 2 tablets (40 mEq total) by mouth daily for 3 days. 6 tablet 0    rosuvastatin (CRESTOR) 40 MG tablet Take 1 tablet (40 mg total) by mouth every evening for 360  doses. (Patient not taking: Reported on 08/26/2023) 90 tablet 3 Not Taking   Semaglutide, 2 MG/DOSE, (OZEMPIC, 2 MG/DOSE,) 8 MG/3ML SOPN Inject 2 mg into the skin once a week. (Patient not taking: Reported on 08/26/2023) 8 mL 2 Not Taking   Social History   Socioeconomic History   Marital status: Married    Spouse name: Not on file   Number of children: Not on file   Years of education: Not on file   Highest education level: 12th grade  Occupational History   Not on file  Tobacco Use   Smoking status: Never   Smokeless tobacco: Never  Substance and Sexual Activity   Alcohol use: No   Drug use: No   Sexual activity: Not on file  Other Topics Concern   Not on file  Social History Narrative   Married   Social Drivers of Health  Financial Resource Strain: Medium Risk (06/30/2023)   Overall Financial Resource Strain (CARDIA)    Difficulty of Paying Living Expenses: Somewhat hard  Food Insecurity: Patient Declined (08/27/2023)   Hunger Vital Sign    Worried About Running Out of Food in the Last Year: Patient declined    Ran Out of Food in the Last Year: Patient declined  Transportation Needs: Patient Declined (08/27/2023)   PRAPARE - Administrator, Civil Service (Medical): Patient declined    Lack of Transportation (Non-Medical): Patient declined  Physical Activity: Insufficiently Active (06/30/2023)   Exercise Vital Sign    Days of Exercise per Week: 2 days    Minutes of Exercise per Session: 10 min  Stress: No Stress Concern Present (06/30/2023)   [employer] of Occupational Health - Occupational Stress Questionnaire    Feeling of Stress : Not at all  Social Connections: Moderately Integrated (06/30/2023)   Social Connection and Isolation Panel [NHANES]    Frequency of Communication with Friends and Family: More than three times a week    Frequency of Social Gatherings with Friends and Family: Twice a week    Attends Religious Services: 1 to 4 times per  year    Active Member of [employer] or Organizations: No    Attends Banker Meetings: Never    Marital Status: Married  Catering manager Violence: Patient Declined (08/27/2023)   Humiliation, Afraid, Rape, and Kick questionnaire    Fear of Current or Ex-Partner: Patient declined    Emotionally Abused: Patient declined    Physically Abused: Patient declined    Sexually Abused: Patient declined    Family History  Problem Relation Age of Onset   Coronary artery disease Mother 44   COPD Mother    Heart disease Mother    Hyperlipidemia Mother    Hypertension Mother    Cancer Father    Hyperlipidemia Father    Hypertension Brother    Cancer Brother        lymphoma   Diabetes Maternal Aunt    Diabetes Maternal Uncle    Diabetes Maternal Grandmother    Heart disease Maternal Grandmother      Vitals:   08/26/23 2200 08/26/23 2308 08/27/23 0354 08/27/23 0729  BP:  109/64 135/72   Pulse:  74 81   Resp:  18 18   Temp: 98.9 F (37.2 C) 99.3 F (37.4 C) 97.6 F (36.4 C)   TempSrc: Oral     SpO2:  94% 96% 98%  Height:        PHYSICAL EXAM General: Ill appearing male, well nourished, in no acute distress. HEENT: Normocephalic and atraumatic. Neck: No JVD.  Lungs: Normal respiratory effort on 3L [place]. Clear bilaterally to auscultation. No wheezes, crackles, rhonchi.  Heart: HRRR, fast rate. Normal S1 and S2 without gallops or murmurs.  Abdomen: Non-distended appearing.  Msk: Normal strength and tone for age. Extremities: Warm and well perfused. No clubbing, cyanosis. No edema.  Neuro: Alert and oriented X 3. Psych: Answers questions appropriately.   Labs: Basic Metabolic Panel: Recent Labs    08/26/23 0457 08/27/23 0251  NA 139 135  K 4.4 2.7*  CL 115* 96*  CO2 14* 26  GLUCOSE 79 153*  BUN 9 13  CREATININE 0.53* 0.69  CALCIUM 5.6* 8.9  MG  --  2.3   Liver Function Tests: Recent Labs    08/25/23 1156 08/25/23 2130  AST 20 22  ALT 33 33  ALKPHOS 63 54  BILITOT 0.6 1.2  PROT 7.4 7.2  ALBUMIN 4.3 3.8   Recent Labs    08/25/23 1156 08/25/23 2130  LIPASE 28.0 24   CBC: Recent Labs    08/25/23 2130 08/26/23 0457 08/27/23 0251  WBC 9.3 12.7* 10.6*  NEUTROABS 8.1*  --   --   HGB 14.5 13.7 13.5  HCT 42.4 41.6 39.2  MCV 90.8 93.3 88.1  PLT 181 167 143*   Cardiac Enzymes: Recent Labs    08/26/23 0829 08/26/23 1120 08/26/23 1250  TROPONINIHS 1,071* 1,226* 817*   BNP: No results for input(s): "BNP" in the last 72 hours. D-Dimer: No results for input(s): "DDIMER" in the last 72 hours. Hemoglobin A1C: No results for input(s): "HGBA1C" in the last 72 hours. Fasting Lipid Panel: Recent Labs    08/26/23 0458  CHOL 73  HDL 17*  LDLCALC 39  TRIG 83  CHOLHDL 4.3   Thyroid Function Tests: No results for input(s): "TSH", "T4TOTAL", "T3FREE", "THYROIDAB" in the last 72 hours.  Invalid input(s): "FREET3" Anemia Panel: No results for input(s): "VITAMINB12", "FOLATE", "FERRITIN", "TIBC", "IRON", "RETICCTPCT" in the last 72 hours.   Radiology: DG Hand Complete Left Result Date: 08/26/2023 CLINICAL DATA:  Left hand swelling. EXAM: LEFT HAND - COMPLETE 3+ VIEW COMPARISON:  None Available. FINDINGS: No fracture or bone lesion. Joints are normally spaced and aligned. Soft tissue swelling, most evident over the dorsal aspect of the hand and wrist. No soft tissue air. No radiopaque foreign body. IMPRESSION: 1. No fracture or acute finding. 2. Nonspecific soft tissue swelling. Electronically Signed   By: Amie Portland M.D.   On: 08/26/2023 15:26   CT ABDOMEN PELVIS W CONTRAST Result Date: 08/25/2023 CLINICAL DATA:  Fever, nausea/vomiting/diarrhea, abdominal pain, polyuria EXAM: CT ABDOMEN AND PELVIS WITH CONTRAST TECHNIQUE: Multidetector CT imaging of the abdomen and pelvis was performed using the standard protocol following bolus administration of intravenous contrast. RADIATION DOSE REDUCTION: This exam was performed according to the  departmental dose-optimization program which includes automated exposure control, adjustment of the mA and/or kV according to patient size and/or use of iterative reconstruction technique. CONTRAST:  OMNIPAQUE IOHEXOL 350 MG/ML SOLN COMPARISON:  04/03/2012 FINDINGS: Lower chest: There is minimal subpleural right lower lobe airspace disease, which could reflect focal pneumonia. No effusion. Hepatobiliary: No focal liver abnormality is seen. No gallstones, gallbladder wall thickening, or biliary dilatation. Pancreas: Unremarkable. No pancreatic ductal dilatation or surrounding inflammatory changes. Spleen: Normal in size without focal abnormality. Adrenals/Urinary Tract: The kidneys enhance normally and symmetrically. No urinary tract calculi or obstructive uropathy within either kidney. Bladder is minimally distended, with no filling defects. The adrenals are normal. Stomach/Bowel: No bowel obstruction or ileus. Normal appendix right lower quadrant. Scattered diverticulosis of the sigmoid colon without diverticulitis. No bowel wall thickening or inflammatory change. Vascular/Lymphatic: Aortic atherosclerosis. No enlarged abdominal or pelvic lymph nodes. Reproductive: Prostate is unremarkable. Other: No free fluid or free intraperitoneal gas. No abdominal wall hernia. Musculoskeletal: No acute or destructive bony abnormalities. Bilateral hip osteoarthritis, left greater than right. Reconstructed images demonstrate no additional findings. IMPRESSION: 1. Patchy subpleural right lower lobe airspace disease, consistent with focal bronchopneumonia. 2. No acute intra-abdominal or intrapelvic process. 3.  Aortic Atherosclerosis (ICD10-I70.0). Electronically Signed   By: Sharlet Salina M.D.   On: 08/25/2023 23:08   DG Chest Port 1 View Result Date: 08/25/2023 CLINICAL DATA:  Sepsis, abdominal pain, polyuria, diabetes EXAM: PORTABLE CHEST 1 VIEW COMPARISON:  06/23/2022 FINDINGS: Single frontal view of the chest  demonstrates  an enlarged cardiac silhouette. Lung volumes are diminished, with crowding of the central vasculature. No effusion or pneumothorax. No acute bony abnormalities. IMPRESSION: 1. Low lung volumes, with crowding of the pulmonary vasculature. 2. Enlarged cardiac silhouette. Electronically Signed   By: Sharlet Salina M.D.   On: 08/25/2023 22:45   CT Head Wo Contrast Result Date: 08/25/2023 CLINICAL DATA:  Fever. Mental status change of unknown cause. Diabetic. EXAM: CT HEAD WITHOUT CONTRAST TECHNIQUE: Contiguous axial images were obtained from the base of the skull through the vertex without intravenous contrast. RADIATION DOSE REDUCTION: This exam was performed according to the departmental dose-optimization program which includes automated exposure control, adjustment of the mA and/or kV according to patient size and/or use of iterative reconstruction technique. COMPARISON:  None Available. FINDINGS: Brain: The study suffers from motion degradation. With repeat imaging, a satisfactory exam is achieved. No sign of old or acute focal infarction, mass lesion, hemorrhage, hydrocephalus or extra-axial collection. Vascular: There is atherosclerotic calcification of the major vessels at the base of the brain. Skull: Negative Sinuses/Orbits: Apparent soft tissue swelling of the right posterior parietal scalp, presumed hematoma. Orbits negative. Other: None IMPRESSION: No acute intracranial finding. Apparent soft tissue swelling of the right posterior parietal scalp, presumed hematoma. Electronically Signed   By: Paulina Fusi M.D.   On: 08/25/2023 22:39    ECHO ordered  TELEMETRY reviewed by me 08/27/2023: sinus rhythm rate 90s  EKG reviewed by me: sinus tachycardia rate 101 bpm  Data reviewed by me 08/27/2023: last 24h vitals tele labs imaging I/O hospitalist progress note  Principal Problem:   NSTEMI (non-ST elevated myocardial infarction) (HCC) Active Problems:   Sepsis due to pneumonia (HCC)    Hypokalemia   Metabolic acidosis   Type 2 diabetes mellitus with peripheral neuropathy (HCC)   Essential hypertension   BPH (benign prostatic hyperplasia)   Influenza A   Hypocalcemia   Septicemia due to group D Streptococcus (HCC)    ASSESSMENT AND PLAN:  Connor Ross is a 58 y.o. male  with a past medical history of non-STEMI coronary disease (s/p previous stents to prox and mid LAD in remote past, s/p PCI and stent to distal RCA and overlapping prox RCA 06/2022) diabetes, hypertension, obesity, hyperlipidemia who presented to the ED on 08/25/2023 for fever, vomiting, diarrhea. Troponins checked and found to be elevated. Cardiology was consulted for further evaluation.   # NSTEMI # Coronary artery disease # Hypertension Patient with history of coronary disease with prior stenting in 06/2022 found to have elevated troponins upon admission.  No reports of chest pain symptoms.  Troponins trended 89 > 272 > 576 > 607 > 1071 > 1226 > 817.  -Echo ordered. -Continue heparin infusion.  There was mistake overnight with the patient's rate on this and he received 25,000 units in less than 2 hours, this is currently held and will be resumed once labs normalized.  Hemoglobin is stable. -S/p ASA 325 mg in the ED.  Continue rosuvastatin 40 mg daily, aspirin 81 mg daily, Plavix 75 mg daily. -Continue losartan 100 mg daily, metoprolol titrate 50 mg twice daily. -No plan for further cardiac diagnostics at this time given strep bacteremia.  Can consider cath in future for evaluation after infection has been treated appropriately.  # Influenza A infection # Pneumonia # Strep agalactiae bacteremia Patient presented to the ED with fever/chills, vomiting, diarrhea.  Reports feeling poorly for roughly 1 week.  Flu a positive in the ED.  CTA abdomen concerning for  right lower lobe pneumonia.  Blood culture positive for strep agalactiae -Further management per primary.   This patient's plan of care was  discussed and created with Dr. Juliann Pares and he is in agreement.  Signed: Gale Journey, PA-C  08/27/2023, 8:44 AM [facility] Cardiology

## 2023-08-27 NOTE — Progress Notes (Signed)
Progress Note   Patient: Connor Ross ZOX:[id number] DOB: 1966/04/06 DOA: 08/25/2023     1 DOS: the patient was seen and examined on 08/27/2023   Brief hospital course: Connor Ross is a 58 y.o. male with medical history significant for type diabetes mellitus, hypertension and dyslipidemia, who presented to the emergency room with acute onset of fever with altered mental status  Patient was found to have tachycardia, tachypnea.  No leukocytosis.  Influenza was positive, blood culture was positive in 1 bottle with Streptococcus.  She was placed on Rocephin.  Patient also had elevated troponin peaked at 1226, seen by cardiology and placed on heparin drip.   Principal Problem:   NSTEMI (non-ST elevated myocardial infarction) (HCC) Active Problems:   Sepsis due to pneumonia (HCC)   Hypokalemia   Metabolic acidosis   Type 2 diabetes mellitus with peripheral neuropathy (HCC)   Essential hypertension   BPH (benign prostatic hyperplasia)   Influenza A   Hypocalcemia   Septicemia due to group D Streptococcus (HCC)   Assessment and Plan:  * NSTEMI (non-ST elevated myocardial infarction) (HCC) Elevated troponin in the setting of influenza and septicemia.   Troponin peaked 1226, trended down, most likely has non-STEMI.  Cardiology consulted, cont Hep gtt for now, will need cath after improvement, can not do it due to bacteremia  # Sepsis due to pneumonia [facility]) # Influenza A + # Bacteremia, GB Strep agalactiae   # Left hand cellulitis. Patient did meet sepsis criteria at time of admission with tachycardia and tachypnea.   Chest x-ray and CT scan also showed left lower lobe airspace disease consistent with pneumonia. Patient blood culture positive for Streptococcus agalactia.  The urine culture performed on 09/2022 also positive for Streptococcus agalactia.  Patient also has left hand swelling and red, states that he might had a spider bite. x-ray negative for Fx. He has  multiple reasons for bacteremia, but exact etiology is unclear.  Patient may need additional workup to rule out endocarditis.   ID consulted. Continue Rocephin for now. Patient is symptom of the flu started last Wednesday, out of the window for Tamiflu. 2/12 repeat Bld Cx  # Hypokalemia, K repleted  # Metabolic acidosis. Resolved, s/p bicarb drip. Monitored electrolytes daily  BPH (benign prostatic hyperplasia) - resumed Flomax.  Essential hypertension - continue antihypertensive therapy.  Type 2 diabetes mellitus with peripheral neuropathy - The patient will be placed on supplemental coverage with NovoLog. - continue basal coverage. - continue Neurontin.  Morbid obesity with BMI 40.47.   Diet and excise advised.  Debility. Patient was severely debilitated at home, largely due to joint arthritis.  Obtain PT/OT.   Subjective:  Patient currently doing well denies any short of breath or cough.    Physical Exam: Vitals:   08/27/23 0354 08/27/23 0729 08/27/23 0913 08/27/23 1112  BP: 135/72  129/71 126/61  Pulse: 81  85 92  Resp: 18     Temp: 97.6 F (36.4 C)  99.5 F (37.5 C) 99.1 F (37.3 C)  TempSrc:   Oral Oral  SpO2: 96% 98% 94% 93%  Height:       General exam: Appears calm and comfortable, morbid obese. Respiratory system: Mild b/l crackles, no wheezes, no resp distress, normal work of breathing  Cardiovascular system: S1 & S2 heard, RRR. No JVD, murmurs, rubs, gallops or clicks. No pedal edema. Gastrointestinal system: Abdomen is nondistended, soft and nontender. No organomegaly or masses felt. Normal bowel sounds heard. Central nervous system:  Alert and oriented. No focal neurological deficits. Extremities: Left Hand redness swelling. Skin: No rashes, lesions or ulcers Psychiatry: Judgement and insight appear normal. Mood & affect appropriate.    Data Reviewed:  Reviewed CT scan results, chest x-ray results and CT head results.  Family Communication: Wife  updated at bedside.  Disposition: Status is: Inpatient Remains inpatient appropriate because: Severity of disease, IV treatment.     Time spent: 45 min  Author: Gillis Santa, MD 08/27/2023 3:45 PM  For on call review [email or web address].

## 2023-08-27 NOTE — Progress Notes (Signed)
       CROSS COVER NOTE  NAME: Connor Ross MRN: [id number] DOB : 06/17/1966    Date of Service   08/27/2023   HPI/Events of Note   Nurse reported that they realized that patient's heparin infusion was going in an incorrect rate.  Patient had received 250,000 units of heparin within 2 hours.  No bleeding noted at the time.    Interventions   This NP consult with pharmacy and decided to order protamine sulfate for reversal.  Patient's PTT resulted at greater than 200 seconds. Repeat PTT was within defined limits. Heparin has been held.  Please reassess this a.m. and restart if needed.       Debbra Digiulio Lamin Geradine Girt, MSN, APRN, AGACNP-BC [facility] [place] Pager: [phone]. Check Amion for Availability

## 2023-08-27 NOTE — Progress Notes (Signed)
*  PRELIMINARY RESULTS* Echocardiogram 2D Echocardiogram has been performed.  Carolyne Fiscal 08/27/2023, 3:38 PM

## 2023-08-27 NOTE — Progress Notes (Signed)
Pt heparin infusion is going at the incorrect rate. Provider is notified. Infusion is stopped abruptly. Provider comes to the bedside to assess for any signs of bleeding. No bleeeding is noted and patient is alert without any complaints. Pharmacy is contacted. Lab orders are rendered and specimens are obtained. Reversal agent is ordered.

## 2023-08-27 NOTE — Consult Note (Signed)
PHARMACY - ANTICOAGULATION CONSULT NOTE  Pharmacy Consult for Heparin Indication: chest pain/ACS  Allergies  Allergen Reactions   Statins     Other Reaction(s): arthralgia (joint pain) Patient able to tolerate Crestor 10 mg   Hctz [Hydrochlorothiazide] Other (See Comments)    Extreme Cramping   Metformin And Related Diarrhea    Patient Measurements: Height: 5\' 11"  (180.3 cm) IBW/kg (Calculated) : 75.3 Heparin Dosing Weight: 105.4 kg  Vital Signs: Temp: 99.5 F (37.5 C) (02/12 0913) Temp Source: Oral (02/12 0913) BP: 129/71 (02/12 0913) Pulse Rate: 85 (02/12 0913)  Labs: Recent Labs    08/25/23 2130 08/25/23 2321 08/26/23 0024 08/26/23 0226 08/26/23 0457 08/26/23 0829 08/26/23 1120 08/26/23 1250 08/26/23 1443 08/26/23 2356 08/27/23 0251  HGB 14.5  --   --   --  13.7  --   --   --   --   --  13.5  HCT 42.4  --   --   --  41.6  --   --   --   --   --  39.2  PLT 181  --   --   --  167  --   --   --   --   --  143*  APTT  --   --  33  --   --   --   --   --   --  >200* 34  LABPROT  --   --  14.4  --   --   --   --   --   --   --   --   INR  --   --  1.1  --   --   --   --   --   --   --   --   HEPARINUNFRC  --   --   --   --  0.44  --   --   --  0.33 >1.10*  --   CREATININE 0.79  --   --   --  0.53*  --   --   --   --   --  0.69  TROPONINIHS 89*   < >  --    < > 607* 1,071* 1,226* 817*  --   --   --    < > = values in this interval not displayed.    Estimated Creatinine Clearance: 137.5 mL/min (by C-G formula based on SCr of 0.69 mg/dL).   Medical History: Past Medical History:  Diagnosis Date   Diabetes mellitus    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 type 2 diabetes mellitus, limited to breakdown of skin (HCC) 04/23/2019   Fracture of one rib, left side, subsequent encounter for fracture with routine healing 08/27/2020   Hyperlipidemia    Hypertension    Normal cardiac stress test 2012   [facility]    Medications:  No history of chronic  anticoagulant use PTA  Assessment: 58yo patient presents to the ED via [facility] from home. Pt arrives with fever, AMS, N/V/D, abdominal pain, and increased urination. He was seen by his doctor today and was at his baseline mental status at that time but subsequent became confused. The patient himself states he feels relatively okay. Troponin levels of 89>272 and EKG showing ST elevation. Pharmacy has been consulted to initiate and monitor continuous heparin infusion.   Date Time HL Rate/Comment  2/11 0457 0.44 Therapeutic x1 Confirmatory heparin level drawn early this AM, repeat sample was  hemolyzed, third sample sent late 2/11 1443 0.33 Therapeutic x2  No issues with infusion reported, no signs/symptoms of bleeding noted.   Goal of Therapy:  Heparin level 0.3-0.7 units/ml Monitor platelets by anticoagulation protocol: Yes   Plan: Heparin on hold. Called lab and the heparin level was not detected. Will give heparin bolus of 3000 units and restart heparin infusion at at 1500 units/hr. Recheck heparin level in 6 hours. CBC daily while on heparin.   Thank you for involving pharmacy in this patient's care.   Paschal Dopp, [name] Clinical Pharmacist 08/27/2023 10:36 AM

## 2023-08-27 NOTE — Plan of Care (Signed)
  Problem: Education: Goal: Understanding of cardiac disease, CV risk reduction, and recovery process will improve Outcome: Progressing Goal: Individualized Educational Video(s) Outcome: Progressing   Problem: Activity: Goal: Ability to tolerate increased activity will improve Outcome: Progressing   Problem: Cardiac: Goal: Ability to achieve and maintain adequate cardiovascular perfusion will improve Outcome: Progressing   Problem: Health Behavior/Discharge Planning: Goal: Ability to safely manage health-related needs after discharge will improve Outcome: Progressing   Problem: Education: Goal: Knowledge of General Education information will improve Description: Including pain rating scale, medication(s)/side effects and non-pharmacologic comfort measures Outcome: Progressing   Problem: Health Behavior/Discharge Planning: Goal: Ability to manage health-related needs will improve Outcome: Progressing   Problem: Clinical Measurements: Goal: Ability to maintain clinical measurements within normal limits will improve Outcome: Progressing Goal: Will remain free from infection Outcome: Progressing Goal: Diagnostic test results will improve Outcome: Progressing Goal: Respiratory complications will improve Outcome: Progressing Goal: Cardiovascular complication will be avoided Outcome: Progressing   Problem: Activity: Goal: Risk for activity intolerance will decrease Outcome: Progressing   Problem: Nutrition: Goal: Adequate nutrition will be maintained Outcome: Progressing   Problem: Coping: Goal: Level of anxiety will decrease Outcome: Progressing   Problem: Elimination: Goal: Will not experience complications related to bowel motility Outcome: Progressing Goal: Will not experience complications related to urinary retention Outcome: Progressing   Problem: Pain Managment: Goal: General experience of comfort will improve and/or be controlled Outcome: Progressing    Problem: Safety: Goal: Ability to remain free from injury will improve Outcome: Progressing   Problem: Skin Integrity: Goal: Risk for impaired skin integrity will decrease Outcome: Progressing   Problem: Activity: Goal: Ability to tolerate increased activity will improve Outcome: Progressing   Problem: Clinical Measurements: Goal: Ability to maintain a body temperature in the normal range will improve Outcome: Progressing   Problem: Respiratory: Goal: Ability to maintain adequate ventilation will improve Outcome: Progressing Goal: Ability to maintain a clear airway will improve Outcome: Progressing

## 2023-08-27 NOTE — Progress Notes (Signed)
Transition of Care [facility]) - Inpatient Brief Assessment   Patient Details  Name: Connor Ross MRN: [id number] Date of Birth: 06-12-1966  [facility] CM/SW Contact:    Truddie Hidden, RN Phone Number: 08/27/2023, 1:37 PM   Clinical Narrative: TOC continuing to follow patient's progress throughout discharge planning.   Transition of Care Asessment: Insurance and Status: Insurance coverage has been reviewed Patient has primary care physician: Yes Home environment has been reviewed: Home Prior level of function:: Independent Prior/Current Home Services: No current home services Social Drivers of Health Review: SDOH reviewed no interventions necessary Readmission risk has been reviewed: Yes Transition of care needs: no transition of care needs at this time

## 2023-08-27 NOTE — Progress Notes (Signed)
Date of Admission:  08/25/2023      ID: Connor Ross is a 58 y.o. male  Principal Problem:   NSTEMI (non-ST elevated myocardial infarction) (HCC) Active Problems:   Sepsis due to pneumonia (HCC)   Hypokalemia   Metabolic acidosis   Type 2 diabetes mellitus with peripheral neuropathy (HCC)   Essential hypertension   BPH (benign prostatic hyperplasia)   Influenza A   Hypocalcemia   Septicemia due to group D Streptococcus (HCC)    Subjective: Pt says he is feeling a bit better today Left hand hurts and movt at wrist is painful  Medications:   aspirin EC  81 mg Oral Daily   calcium-vitamin D  2 tablet Oral TID   clopidogrel  75 mg Oral Daily   empagliflozin  25 mg Oral Daily   furosemide  20 mg Oral QODAY   gabapentin  100 mg Oral TID   guaiFENesin  600 mg Oral BID   heparin  3,000 Units Intravenous Once   losartan  100 mg Oral Daily   metoprolol tartrate  50 mg Oral BID   multivitamin with minerals  1 tablet Oral Daily   oseltamivir  75 mg Oral BID   rosuvastatin  40 mg Oral QPM   tamsulosin  0.4 mg Oral Daily   Vitamin D (Ergocalciferol)  50,000 Units Oral Weekly    Objective: Vital signs in last 24 hours: Patient Vitals for the past 24 hrs:  BP Temp Temp src Pulse Resp SpO2  08/27/23 1112 126/61 99.1 F (37.3 C) Oral 92 -- 93 %  08/27/23 0913 129/71 99.5 F (37.5 C) Oral 85 -- 94 %  08/27/23 0729 -- -- -- -- -- 98 %  08/27/23 0354 135/72 97.6 F (36.4 C) -- 81 18 96 %  08/26/23 2308 109/64 99.3 F (37.4 C) -- 74 18 94 %  08/26/23 2200 -- 98.9 F (37.2 C) Oral -- -- --  08/26/23 2137 125/76 (!) 100.7 F (38.2 C) Oral 97 20 97 %  08/26/23 2025 132/82 98.7 F (37.1 C) Oral 91 18 98 %  08/26/23 1700 127/73 -- -- 98 17 100 %  08/26/23 1630 132/65 -- -- (!) 106 -- 100 %  08/26/23 1600 129/63 -- -- (!) 101 -- 99 %  08/26/23 1544 -- 99.3 F (37.4 C) Oral -- -- --  08/26/23 1520 124/63 -- -- 89 18 99 %  08/26/23 1405 -- -- -- 95 18 98 %  08/26/23  1200 (!) 130/57 -- -- 90 17 99 %      PHYSICAL EXAM:  General: Alert, cooperative, no distress, appears stated age. Still has some memory deficit Head: Normocephalic, without obvious abnormality, atraumatic. Eyes: Conjunctivae clear, anicteric sclerae. Pupils are equal ENT Nares normal. No drainage or sinus tenderness. Lips, mucosa, and tongue normal. No Thrush Neck: Supple, symmetrical, no adenopathy, thyroid: non tender no carotid bruit and no JVD. Back: No CVA tenderness. Lungs: Clear to auscultation bilaterally. No Wheezing or Rhonchi. No rales. Heart: Regular rate and rhythm, no murmur, rub or gallop. Abdomen: Soft, non-tender,not distended. Bowel sounds normal. No masses Extremities:left hand dorsum swollen , erythematous  Skin: No rashes or lesions. Or bruising Lymph: Cervical, supraclavicular normal. Neurologic: Grossly non-focal  Lab Results    Latest Ref Rng & Units 08/27/2023    2:51 AM 08/26/2023    4:57 AM 08/25/2023    9:30 PM  CBC  WBC 4.0 - 10.5 K/uL 10.6  12.7  9.3   Hemoglobin  13.0 - 17.0 g/dL 16.1  09.6  04.5   Hematocrit 39.0 - 52.0 % 39.2  41.6  42.4   Platelets 150 - 400 K/uL 143  167  181        Latest Ref Rng & Units 08/27/2023    2:51 AM 08/26/2023    4:57 AM 08/25/2023    9:30 PM  CMP  Glucose 70 - 99 mg/dL 409  79  811   BUN 6 - 20 mg/dL 13  9  12    Creatinine 0.61 - 1.24 mg/dL 9.14  7.82  9.56   Sodium 135 - 145 mmol/L 135  139  131   Potassium 3.5 - 5.1 mmol/L 2.7  4.4  2.9   Chloride 98 - 111 mmol/L 96  115  96   CO2 22 - 32 mmol/L 26  14  18    Calcium 8.9 - 10.3 mg/dL 8.9  5.6  8.4   Total Protein 6.5 - 8.1 g/dL   7.2   Total Bilirubin 0.0 - 1.2 mg/dL   1.2   Alkaline Phos 38 - 126 U/L   54   AST 15 - 41 U/L   22   ALT 0 - 44 U/L   33       Microbiology: 08/25/23 4/4 GBS - but taken from the same site Studies/Results: DG Hand Complete Left Result Date: 08/26/2023 CLINICAL DATA:  Left hand swelling. EXAM: LEFT HAND - COMPLETE 3+ VIEW  COMPARISON:  None Available. FINDINGS: No fracture or bone lesion. Joints are normally spaced and aligned. Soft tissue swelling, most evident over the dorsal aspect of the hand and wrist. No soft tissue air. No radiopaque foreign body. IMPRESSION: 1. No fracture or acute finding. 2. Nonspecific soft tissue swelling. Electronically Signed   By: Amie Portland M.D.   On: 08/26/2023 15:26   CT ABDOMEN PELVIS W CONTRAST Result Date: 08/25/2023 CLINICAL DATA:  Fever, nausea/vomiting/diarrhea, abdominal pain, polyuria EXAM: CT ABDOMEN AND PELVIS WITH CONTRAST TECHNIQUE: Multidetector CT imaging of the abdomen and pelvis was performed using the standard protocol following bolus administration of intravenous contrast. RADIATION DOSE REDUCTION: This exam was performed according to the departmental dose-optimization program which includes automated exposure control, adjustment of the mA and/or kV according to patient size and/or use of iterative reconstruction technique. CONTRAST:  OMNIPAQUE IOHEXOL 350 MG/ML SOLN COMPARISON:  04/03/2012 FINDINGS: Lower chest: There is minimal subpleural right lower lobe airspace disease, which could reflect focal pneumonia. No effusion. Hepatobiliary: No focal liver abnormality is seen. No gallstones, gallbladder wall thickening, or biliary dilatation. Pancreas: Unremarkable. No pancreatic ductal dilatation or surrounding inflammatory changes. Spleen: Normal in size without focal abnormality. Adrenals/Urinary Tract: The kidneys enhance normally and symmetrically. No urinary tract calculi or obstructive uropathy within either kidney. Bladder is minimally distended, with no filling defects. The adrenals are normal. Stomach/Bowel: No bowel obstruction or ileus. Normal appendix right lower quadrant. Scattered diverticulosis of the sigmoid colon without diverticulitis. No bowel wall thickening or inflammatory change. Vascular/Lymphatic: Aortic atherosclerosis. No enlarged abdominal or  pelvic lymph nodes. Reproductive: Prostate is unremarkable. Other: No free fluid or free intraperitoneal gas. No abdominal wall hernia. Musculoskeletal: No acute or destructive bony abnormalities. Bilateral hip osteoarthritis, left greater than right. Reconstructed images demonstrate no additional findings. IMPRESSION: 1. Patchy subpleural right lower lobe airspace disease, consistent with focal bronchopneumonia. 2. No acute intra-abdominal or intrapelvic process. 3.  Aortic Atherosclerosis (ICD10-I70.0). Electronically Signed   By: Sharlet Salina M.D.   On: 08/25/2023 23:08  DG Chest Port 1 View Result Date: 08/25/2023 CLINICAL DATA:  Sepsis, abdominal pain, polyuria, diabetes EXAM: PORTABLE CHEST 1 VIEW COMPARISON:  06/23/2022 FINDINGS: Single frontal view of the chest demonstrates an enlarged cardiac silhouette. Lung volumes are diminished, with crowding of the central vasculature. No effusion or pneumothorax. No acute bony abnormalities. IMPRESSION: 1. Low lung volumes, with crowding of the pulmonary vasculature. 2. Enlarged cardiac silhouette. Electronically Signed   By: Sharlet Salina M.D.   On: 08/25/2023 22:45   CT Head Wo Contrast Result Date: 08/25/2023 CLINICAL DATA:  Fever. Mental status change of unknown cause. Diabetic. EXAM: CT HEAD WITHOUT CONTRAST TECHNIQUE: Contiguous axial images were obtained from the base of the skull through the vertex without intravenous contrast. RADIATION DOSE REDUCTION: This exam was performed according to the departmental dose-optimization program which includes automated exposure control, adjustment of the mA and/or kV according to patient size and/or use of iterative reconstruction technique. COMPARISON:  None Available. FINDINGS: Brain: The study suffers from motion degradation. With repeat imaging, a satisfactory exam is achieved. No sign of old or acute focal infarction, mass lesion, hemorrhage, hydrocephalus or extra-axial collection. Vascular: There is  atherosclerotic calcification of the major vessels at the base of the brain. Skull: Negative Sinuses/Orbits: Apparent soft tissue swelling of the right posterior parietal scalp, presumed hematoma. Orbits negative. Other: None IMPRESSION: No acute intracranial finding. Apparent soft tissue swelling of the right posterior parietal scalp, presumed hematoma. Electronically Signed   By: Paulina Fusi M.D.   On: 08/25/2023 22:39     Assessment/Plan:   58 year old male presenting with confusion, vomiting and diarrhea and pain left hand and fever   Influenza a viral illness on Tamiflu   Streptococcus group G bacteremia Likely source is the left hand cellulitis The source of the cellulitis is likely from the fingersticks he does for diabetic checkup He is colonized with group B strep especially in his urine in 2024 and may be on his skin as well that will be the risk for the cellulitis He is currently on ceftriaxone Continue the same Left hand cellulitis. Is there septic arthritis of wrist? May consult ortho if not improved tomorrow  2d echo shows mitral valve leaflet thickening GBS is nto a common bacteria to cause endocarditis So will hold of TEE for now   Encephalopathy secondary to the infection- improving   Diabetes mellitus   NSTEMI on admission Increasing troponin this admission.  Seen by cardiology. CAD status post stents   Discussed the management with the patient and his wife in detail

## 2023-08-28 DIAGNOSIS — R7881 Bacteremia: Secondary | ICD-10-CM | POA: Diagnosis not present

## 2023-08-28 DIAGNOSIS — I214 Non-ST elevation (NSTEMI) myocardial infarction: Secondary | ICD-10-CM | POA: Diagnosis not present

## 2023-08-28 DIAGNOSIS — B951 Streptococcus, group B, as the cause of diseases classified elsewhere: Secondary | ICD-10-CM | POA: Diagnosis not present

## 2023-08-28 DIAGNOSIS — G9340 Encephalopathy, unspecified: Secondary | ICD-10-CM

## 2023-08-28 DIAGNOSIS — J101 Influenza due to other identified influenza virus with other respiratory manifestations: Secondary | ICD-10-CM | POA: Diagnosis not present

## 2023-08-28 HISTORY — DX: Encephalopathy, unspecified: G93.40

## 2023-08-28 LAB — GASTROINTESTINAL PANEL BY PCR, STOOL (REPLACES STOOL CULTURE)

## 2023-08-28 LAB — CBC
HCT: 37.2 % — ABNORMAL LOW (ref 39.0–52.0)
Hemoglobin: 12.8 g/dL — ABNORMAL LOW (ref 13.0–17.0)
MCH: 30.7 pg (ref 26.0–34.0)
MCHC: 34.4 g/dL (ref 30.0–36.0)
MCV: 89.2 fL (ref 80.0–100.0)
Platelets: 138 10*3/uL — ABNORMAL LOW (ref 150–400)
RBC: 4.17 MIL/uL — ABNORMAL LOW (ref 4.22–5.81)
RDW: 13.4 % (ref 11.5–15.5)
WBC: 10.1 10*3/uL (ref 4.0–10.5)
nRBC: 0 % (ref 0.0–0.2)

## 2023-08-28 LAB — CULTURE, BLOOD (ROUTINE X 2)

## 2023-08-28 LAB — HEPARIN LEVEL (UNFRACTIONATED)
Heparin Unfractionated: 0.18 [IU]/mL — ABNORMAL LOW (ref 0.30–0.70)
Heparin Unfractionated: 0.21 [IU]/mL — ABNORMAL LOW (ref 0.30–0.70)
Heparin Unfractionated: 0.3 [IU]/mL (ref 0.30–0.70)

## 2023-08-28 LAB — BASIC METABOLIC PANEL
Anion gap: 13 (ref 5–15)
BUN: 13 mg/dL (ref 6–20)
CO2: 23 mmol/L (ref 22–32)
Calcium: 8.7 mg/dL — ABNORMAL LOW (ref 8.9–10.3)
Chloride: 97 mmol/L — ABNORMAL LOW (ref 98–111)
Creatinine, Ser: 0.57 mg/dL — ABNORMAL LOW (ref 0.61–1.24)
GFR, Estimated: >60 mL/min (ref >60)
Glucose, Bld: 152 mg/dL — ABNORMAL HIGH (ref 70–99)
Potassium: 3.3 mmol/L — ABNORMAL LOW (ref 3.5–5.1)
Sodium: 133 mmol/L — ABNORMAL LOW (ref 135–145)

## 2023-08-28 LAB — MAGNESIUM: Magnesium: 2.1 mg/dL (ref 1.7–2.4)

## 2023-08-28 LAB — LIPOPROTEIN A (LPA): Lipoprotein (a): 88.5 nmol/L — ABNORMAL HIGH (ref <75.0)

## 2023-08-28 LAB — PHOSPHORUS: Phosphorus: 2.3 mg/dL — ABNORMAL LOW (ref 2.5–4.6)

## 2023-08-28 MED ORDER — HEPARIN BOLUS VIA INFUSION
3000.0000 [IU] | Freq: Once | INTRAVENOUS | Status: AC
  Administered 2023-08-28: 3000 [IU] via INTRAVENOUS
  Filled 2023-08-28: qty 3000

## 2023-08-28 MED ORDER — POTASSIUM CHLORIDE CRYS ER 20 MEQ PO TBCR
40.0000 meq | EXTENDED_RELEASE_TABLET | Freq: Once | ORAL | Status: AC
  Administered 2023-08-28: 40 meq via ORAL
  Filled 2023-08-28: qty 2

## 2023-08-28 MED ORDER — K PHOS MONO-SOD PHOS DI & MONO 155-852-130 MG PO TABS
500.0000 mg | ORAL_TABLET | Freq: Three times a day (TID) | ORAL | Status: AC
  Administered 2023-08-28 (×3): 500 mg via ORAL
  Filled 2023-08-28 (×3): qty 2

## 2023-08-28 MED ORDER — HEPARIN BOLUS VIA INFUSION
1500.0000 [IU] | Freq: Once | INTRAVENOUS | Status: AC
  Administered 2023-08-29: 1500 [IU] via INTRAVENOUS
  Filled 2023-08-28: qty 1500

## 2023-08-28 MED ORDER — POTASSIUM CHLORIDE CRYS ER 20 MEQ PO TBCR
20.0000 meq | EXTENDED_RELEASE_TABLET | Freq: Once | ORAL | Status: AC
  Administered 2023-08-28: 20 meq via ORAL
  Filled 2023-08-28: qty 1

## 2023-08-28 MED ORDER — PREDNISONE 20 MG PO TABS
40.0000 mg | ORAL_TABLET | Freq: Every day | ORAL | Status: AC
  Administered 2023-08-28 – 2023-08-30 (×3): 40 mg via ORAL
  Filled 2023-08-28 (×3): qty 2

## 2023-08-28 MED ORDER — PANTOPRAZOLE SODIUM 40 MG PO TBEC
40.0000 mg | DELAYED_RELEASE_TABLET | Freq: Every day | ORAL | Status: DC
  Administered 2023-08-28 – 2023-09-02 (×6): 40 mg via ORAL
  Filled 2023-08-28 (×6): qty 1

## 2023-08-28 NOTE — Progress Notes (Signed)
 Progress Note   Patient: Connor Ross XBJ:[id number] DOB: 29-Sep-1965 DOA: 08/25/2023     2 DOS: the patient was seen and examined on 08/28/2023   Brief hospital course: Connor Ross is a 58 y.o. male with medical history significant for type diabetes mellitus, hypertension and dyslipidemia, who presented to the emergency room with acute onset of fever with altered mental status  Patient was found to have tachycardia, tachypnea.  No leukocytosis.  Influenza was positive, blood culture was positive in 1 bottle with Streptococcus.  She was placed on Rocephin.  Patient also had elevated troponin peaked at 1226, seen by cardiology and placed on heparin drip.   Principal Problem:   NSTEMI (non-ST elevated myocardial infarction) (HCC) Active Problems:   Sepsis due to pneumonia (HCC)   Hypokalemia   Metabolic acidosis   Type 2 diabetes mellitus with peripheral neuropathy (HCC)   Essential hypertension   BPH (benign prostatic hyperplasia)   Influenza A   Hypocalcemia   Septicemia due to group D Streptococcus (HCC)   Bacteremia due to group B Streptococcus   Sepsis due to group B Streptococcus (HCC)   Assessment and Plan:  # NSTEMI (non-ST elevated myocardial infarction) (HCC) Elevated troponin in the setting of influenza and septicemia.   Troponin peaked 1226, trended down, most likely has non-STEMI.  Cardiology consulted, cont Hep gtt for now, will need cath after improvement, can not do it due to bacteremia  # Acute hypoxic respiratory failure secondary to pneumonia # Sepsis due to pneumonia  # Influenza A + # Bacteremia, GB Strep agalactiae   # Left hand cellulitis. Patient did meet sepsis criteria at time of admission with tachycardia and tachypnea.   Chest x-ray and CT scan also showed left lower lobe airspace disease consistent with pneumonia. Patient blood culture positive for Streptococcus agalactia.  The urine culture performed on 09/2022 also positive for  Streptococcus agalactia.  Patient also has left hand swelling and red, states that he might had a spider bite. x-ray negative for Fx. He has multiple reasons for bacteremia, but exact etiology is unclear.  No need of TEE as per ID   ID consulted. Continue Rocephin for now. Patient is symptom of the flu started last Wednesday, out of the window for Tamiflu. 2/12 repeat Bld Cx NGTD 2/13 started pantoprazole 40 mg p.o. daily  # Hypokalemia, K repleted  # Hypophosphatemia, Phos repleted # Metabolic acidosis. Resolved, s/p bicarb drip. Monitored electrolytes daily  # BPH (benign prostatic hyperplasia) - resumed Flomax.  # Essential hypertension - continue antihypertensive therapy.  # Type 2 diabetes mellitus with peripheral neuropathy - The patient will be placed on supplemental coverage with NovoLog. - continue basal coverage. - continue Neurontin.  # Morbid obesity with BMI 40.47.   Diet and excise advised.  # Debility. Patient was severely debilitated at home, largely due to joint arthritis.  Obtain PT/OT.   Subjective:  No significant overnight events, patient is still having mild shortness of breath, threader oxygen via nasal cannula, had 1 episode of diarrhea but he is not sure about that, having some memory issues. Denies any significant cough. Denied any chest pain or palpitations.  Physical Exam: Vitals:   08/28/23 0753 08/28/23 0824 08/28/23 0903 08/28/23 1109  BP: (!) 140/79 (!) 145/72 (!) 145/72 (!) 146/89  Pulse: 80 80 80 77  Resp: 19   18  Temp: 98.8 F (37.1 C) 99 F (37.2 C)  98.7 F (37.1 C)  TempSrc: Oral Oral  Oral  SpO2: 95% 94%  96%  Height:       General exam: Appears calm and comfortable, morbid obese. Respiratory system: Mild b/l crackles, no wheezes, no resp distress, normal work of breathing  Cardiovascular system: S1 & S2 heard, RRR. No JVD, murmurs, rubs, gallops or clicks. No pedal edema. Gastrointestinal system: Abdomen is nondistended,  soft and nontender. No organomegaly or masses felt. Normal bowel sounds heard. Central nervous system: Alert and oriented. No focal neurological deficits. Extremities: Left Hand redness swelling. Skin: No rashes, lesions or ulcers Psychiatry: Judgement and insight appear normal. Mood & affect appropriate.    Data Reviewed:  Reviewed CT scan results, chest x-ray results and CT head results.  Family Communication: Wife updated at bedside.  Disposition: Status is: Inpatient Remains inpatient appropriate because: Severity of disease, IV treatment.     Time spent: 45 min  Author: Gillis Santa, MD 08/28/2023 4:47 PM  For on call review [email or web address].

## 2023-08-28 NOTE — [place] FL2 (Signed)
w [place] MEDICAID FL2 LEVEL OF CARE FORM     IDENTIFICATION  Patient Name: Connor Ross Birthdate: 07/11/1966 Sex: male Admission Date (Current Location): 08/25/2023  [place] and [place] Number:  Chiropodist and Address:  [facility], [address], [place], [place] [address]      Provider Number: [id number]  Attending Physician Name and Address:  Gillis Santa, MD  Relative Name and Phone Number:  RENDON, HOWELL (Spouse)  [phone] (Mobile)    Current Level of Care: Hospital Recommended Level of Care: Skilled Nursing Facility Prior Approval Number:    Date Approved/Denied:   PASRR Number: [id number]  Discharge Plan: Home    Current Diagnoses: Patient Active Problem List   Diagnosis Date Noted   Bacteremia due to group B Streptococcus 08/27/2023   Sepsis due to group B Streptococcus (HCC) 08/27/2023   NSTEMI (non-ST elevated myocardial infarction) (HCC) 08/26/2023   Sepsis due to pneumonia (HCC) 08/26/2023   Hypokalemia 08/26/2023   Metabolic acidosis 08/26/2023   Type 2 diabetes mellitus with peripheral neuropathy (HCC) 08/26/2023   Essential hypertension 08/26/2023   BPH (benign prostatic hyperplasia) 08/26/2023   Influenza A 08/26/2023   Hypocalcemia 08/26/2023   Septicemia due to group D Streptococcus (HCC) 08/26/2023   Vomiting 08/25/2023   Cough 08/25/2023   Cellulitis 08/25/2023   Diabetic retinopathy (HCC) 08/14/2023   Encounter for long-term (current) use of insulin (HCC) 12/31/2022   Long-term current use of injectable noninsulin antidiabetic medication 12/31/2022   Hospital discharge follow-up 07/06/2022   History of non-ST elevation myocardial infarction (NSTEMI) 06/24/2022   Type 2 diabetes mellitus with hyperlipidemia (HCC) 06/24/2022   Venous stasis dermatitis of left lower extremity 04/03/2022   Hypertension associated with diabetes (HCC) 09/27/2021   Pain of left shoulder joint on movement  08/27/2020   Personal history of COVID-19 08/25/2020   Lower extremity edema 12/06/2018   Esophageal stricture 12/06/2018   Osteoarthritis 03/28/2018   Myalgia due to statin 02/08/2015   Colon cancer screening 07/30/2014   Encounter for preventive health examination 05/16/2013   OSA on CPAP 05/14/2013   Spinal stenosis of lumbar region with radiculopathy 12/13/2011   Obesity, diabetes, and hypertension syndrome (HCC) 08/18/2011   Morbid obesity with BMI of 45.0-49.9, adult (HCC) 08/18/2011    Orientation RESPIRATION BLADDER Height & Weight     Self, Place  O2 (023L per nasal cannula) External catheter, Incontinent Weight:   Height:  5\' 11"  (180.3 cm)  BEHAVIORAL SYMPTOMS/MOOD NEUROLOGICAL BOWEL NUTRITION STATUS  Other (Comment) (n/a)  (n/a) Incontinent Diet (Heart)  AMBULATORY STATUS COMMUNICATION OF NEEDS Skin   Limited Assist Verbally Other (Comment) (erythema bilateral hand)                       Personal Care Assistance Level of Assistance  Bathing, Feeding, Dressing Bathing Assistance: Limited assistance Feeding assistance: Limited assistance Dressing Assistance: Limited assistance     Functional Limitations Info  Sight, Hearing Sight Info: Adequate Hearing Info: Adequate      SPECIAL CARE FACTORS FREQUENCY  PT (By licensed PT), OT (By licensed OT)     PT Frequency: Min 2x weekly OT Frequency: Min 2x weekly            Contractures Contractures Info: Not present    Additional Factors Info  Code Status, Allergies Code Status Info: FULL Allergies Info: Statins, Hctz (Hydrochlorothiazide), Metformin And Related           Current Medications (08/28/2023):  This is the current hospital active medication list Current Facility-Administered Medications  Medication Dose Route Frequency Provider Last Rate Last Admin   acetaminophen (TYLENOL) tablet 650 mg  650 mg Oral Q4H PRN Mansy, Jan A, MD   650 mg at 08/26/23 2156   ALPRAZolam Prudy Feeler) tablet 0.25 mg   0.25 mg Oral BID PRN Mansy, Jan A, MD       aspirin EC tablet 81 mg  81 mg Oral Daily Mansy, Jan A, MD   81 mg at 08/28/23 5621   calcium-vitamin D (OSCAL WITH D) 500-5 MG-MCG per tablet 2 tablet  2 tablet Oral TID Marrion Coy, MD   2 tablet at 08/28/23 0901   cefTRIAXone (ROCEPHIN) 2 g in sodium chloride 0.9 % 100 mL IVPB  2 g Intravenous Q24H Aleda Grana, RPH 200 mL/hr at 08/27/23 1820 2 g at 08/27/23 1820   chlorpheniramine-HYDROcodone (TUSSIONEX) 10-8 MG/5ML suspension 5 mL  5 mL Oral Q12H PRN Mansy, Jan A, MD       clopidogrel (PLAVIX) tablet 75 mg  75 mg Oral Daily Mansy, Jan A, MD   75 mg at 08/28/23 3086   empagliflozin (JARDIANCE) tablet 25 mg  25 mg Oral Daily Mansy, Jan A, MD   25 mg at 08/28/23 5784   furosemide (LASIX) tablet 20 mg  20 mg Oral QODAY Mansy, Jan A, MD   20 mg at 08/28/23 0902   gabapentin (NEURONTIN) capsule 100 mg  100 mg Oral TID Mansy, Jan A, MD   100 mg at 08/28/23 0901   guaiFENesin (MUCINEX) 12 hr tablet 600 mg  600 mg Oral BID Mansy, Jan A, MD   600 mg at 08/28/23 0902   heparin ADULT infusion 100 units/mL (25000 units/223mL)  2,200 Units/hr Intravenous Continuous Ronnald Ramp, RPH 22 mL/hr at 08/28/23 1319 2,200 Units/hr at 08/28/23 1319   ipratropium-albuterol (DUONEB) 0.5-2.5 (3) MG/3ML nebulizer solution 3 mL  3 mL Nebulization Q4H PRN Gillis Santa, MD       losartan (COZAAR) tablet 100 mg  100 mg Oral Daily Mansy, Jan A, MD   100 mg at 08/28/23 0902   magnesium hydroxide (MILK OF MAGNESIA) suspension 30 mL  30 mL Oral Daily PRN Mansy, Jan A, MD       metoprolol tartrate (LOPRESSOR) tablet 50 mg  50 mg Oral BID Mansy, Jan A, MD   50 mg at 08/28/23 6962   morphine (PF) 2 MG/ML injection 2 mg  2 mg Intravenous Q2H PRN Mansy, Jan A, MD       multivitamin with minerals tablet 1 tablet  1 tablet Oral Daily Mansy, Jan A, MD   1 tablet at 08/28/23 0902   nitroGLYCERIN (NITROSTAT) SL tablet 0.4 mg  0.4 mg Sublingual Q5 Min x 3 PRN Mansy, Jan A, MD        ondansetron [facility]) injection 4 mg  4 mg Intravenous Q6H PRN Mansy, Jan A, MD   4 mg at 08/27/23 1245   oseltamivir (TAMIFLU) capsule 75 mg  75 mg Oral BID Mansy, Jan A, MD   75 mg at 08/28/23 9528   phosphorus (K PHOS NEUTRAL) tablet 500 mg  500 mg Oral TID Gillis Santa, MD   500 mg at 08/28/23 1230   potassium chloride SA (KLOR-CON M) CR tablet 20 mEq  20 mEq Oral Once Gillis Santa, MD       rosuvastatin (CRESTOR) tablet 40 mg  40 mg Oral QPM Mansy, Vernetta Honey, MD   40  mg at 08/27/23 1810   tamsulosin (FLOMAX) capsule 0.4 mg  0.4 mg Oral Daily Mansy, Jan A, MD   0.4 mg at 08/28/23 1610   traMADol (ULTRAM) tablet 50 mg  50 mg Oral Q6H PRN Mansy, Jan A, MD       traZODone (DESYREL) tablet 25 mg  25 mg Oral QHS PRN Mansy, Vernetta Honey, MD       Vitamin D (Ergocalciferol) (DRISDOL) 1.25 MG (50000 UNIT) capsule 50,000 Units  50,000 Units Oral Weekly Mansy, Vernetta Honey, MD   50,000 Units at 08/26/23 9604     Discharge Medications: Please see discharge summary for a list of discharge medications.  Relevant Imaging Results:  Relevant Lab Results:   Additional Information SSN# [id number]  Truddie Hidden, RN

## 2023-08-28 NOTE — Plan of Care (Signed)
  Problem: Education: Goal: Understanding of cardiac disease, CV risk reduction, and recovery process will improve Outcome: Progressing Goal: Individualized Educational Video(s) Outcome: Progressing   Problem: Activity: Goal: Ability to tolerate increased activity will improve Outcome: Progressing   Problem: Cardiac: Goal: Ability to achieve and maintain adequate cardiovascular perfusion will improve Outcome: Progressing   Problem: Health Behavior/Discharge Planning: Goal: Ability to safely manage health-related needs after discharge will improve Outcome: Progressing   Problem: Education: Goal: Knowledge of General Education information will improve Description: Including pain rating scale, medication(s)/side effects and non-pharmacologic comfort measures Outcome: Progressing   Problem: Health Behavior/Discharge Planning: Goal: Ability to manage health-related needs will improve Outcome: Progressing   Problem: Clinical Measurements: Goal: Ability to maintain clinical measurements within normal limits will improve Outcome: Progressing Goal: Will remain free from infection Outcome: Progressing Goal: Diagnostic test results will improve Outcome: Progressing Goal: Respiratory complications will improve Outcome: Progressing Goal: Cardiovascular complication will be avoided Outcome: Progressing   Problem: Activity: Goal: Risk for activity intolerance will decrease Outcome: Progressing   Problem: Nutrition: Goal: Adequate nutrition will be maintained Outcome: Progressing   Problem: Coping: Goal: Level of anxiety will decrease Outcome: Progressing   Problem: Elimination: Goal: Will not experience complications related to bowel motility Outcome: Progressing Goal: Will not experience complications related to urinary retention Outcome: Progressing   Problem: Pain Managment: Goal: General experience of comfort will improve and/or be controlled Outcome: Progressing    Problem: Safety: Goal: Ability to remain free from injury will improve Outcome: Progressing   Problem: Skin Integrity: Goal: Risk for impaired skin integrity will decrease Outcome: Progressing   Problem: Activity: Goal: Ability to tolerate increased activity will improve Outcome: Progressing   Problem: Clinical Measurements: Goal: Ability to maintain a body temperature in the normal range will improve Outcome: Progressing   Problem: Respiratory: Goal: Ability to maintain adequate ventilation will improve Outcome: Progressing Goal: Ability to maintain a clear airway will improve Outcome: Progressing

## 2023-08-28 NOTE — Consult Note (Signed)
PHARMACY - ANTICOAGULATION CONSULT NOTE  Pharmacy Consult for Heparin Indication: chest pain/ACS  Allergies  Allergen Reactions   Statins     Other Reaction(s): arthralgia (joint pain) Patient able to tolerate Crestor 10 mg   Hctz [Hydrochlorothiazide] Other (See Comments)    Extreme Cramping   Metformin And Related Diarrhea    Patient Measurements: Height: 5\' 11"  (180.3 cm) IBW/kg (Calculated) : 75.3 Heparin Dosing Weight: 105.4 kg  Vital Signs: Temp: 98.4 F (36.9 C) (02/13 2011) Temp Source: Oral (02/13 2011) BP: 171/83 (02/13 2011) Pulse Rate: 96 (02/13 2011)  Labs: Recent Labs    08/26/23 0024 08/26/23 0226 08/26/23 0457 08/26/23 0829 08/26/23 1120 08/26/23 1250 08/26/23 1443 08/26/23 2356 08/27/23 0251 08/27/23 0841 08/27/23 1409 08/27/23 1805 08/28/23 0216 08/28/23 1222 08/28/23 2135  HGB  --    < > 13.7  --   --   --   --   --  13.5  --   --   --  12.8*  --   --   HCT  --   --  41.6  --   --   --   --   --  39.2  --   --   --  37.2*  --   --   PLT  --   --  167  --   --   --   --   --  143*  --   --   --  138*  --   --   APTT 33  --   --   --   --   --   --  >200* 34  --   --   --   --   --   --   LABPROT 14.4  --   --   --   --   --   --   --   --   --   --   --   --   --   --   INR 1.1  --   --   --   --   --   --   --   --   --   --   --   --   --   --   HEPARINUNFRC  --   --  0.44  --   --   --    < > >1.10*  --    < >  --    < > 0.18* 0.21* 0.30  CREATININE  --    < > 0.53*  --   --   --   --   --  0.69  --  0.62  --  0.57*  --   --   TROPONINIHS  --    < > 607* 1,071* 1,226* 817*  --   --   --   --   --   --   --   --   --    < > = values in this interval not displayed.    Estimated Creatinine Clearance: 137.5 mL/min (A) (by C-G formula based on SCr of 0.57 mg/dL (L)).   Medical History: Past Medical History:  Diagnosis Date   Diabetes mellitus    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 type 2 diabetes mellitus, limited to  breakdown of skin (HCC) 04/23/2019   Fracture of one rib, left side, subsequent encounter for fracture with routine healing 08/27/2020   Hyperlipidemia  Hypertension    Normal cardiac stress test 2012   [facility]    Medications:  No history of chronic anticoagulant use PTA  Assessment: 58yo patient presents to the ED via [facility] from home. Pt arrives with fever, AMS, N/V/D, abdominal pain, and increased urination. He was seen by his doctor today and was at his baseline mental status at that time but subsequent became confused. The patient himself states he feels relatively okay. Troponin levels of 89>272 and EKG showing ST elevation. Pharmacy has been consulted to initiate and monitor continuous heparin infusion.   Date Time HL Rate/Comment  2/11 0457 0.44 Therapeutic x1 at 1500un/hr, HL drawn early this AM, repeat sample was hemolyzed, third sample sent late 2/11 1443 0.33 Therapeutic x2 2/12 0100  Infusion HELD and protamine given, received 250,000 u in 2 hrs 2/12 1805 0.11 Subtherapeutic, Heparin resumed at 11:55, rate 1500 un/hr>1600 2/13 0216 0.18 Subtherapeutic@1600  units/hr 2/13 1222  0.21 Subtherapeutic@1900  units/hr 2/13 2135 0.30 Slightly therapeutic@2200  units/hr  Goal of Therapy:  Heparin level 0.3-0.7 units/ml Monitor platelets by anticoagulation protocol: Yes   Plan: Heparin level is on lowest end of therapeutic.  Will give heparin bolus of 1500 units and increase heparin to 2400 units/hr.  Recheck heparin level in 6 hours(with morning labs) CBC daily while on heparin.   Bettey Costa, [name] Clinical Pharmacist 08/28/2023 10:35 PM

## 2023-08-28 NOTE — Evaluation (Signed)
Occupational Therapy Evaluation Patient Details Name: Connor Ross MRN: [id number] DOB: 1966-05-05 Today's Date: 08/28/2023   History of Present Illness   Pt is a 58 y.o. male who presented to the ED on 08/25/2023 for fever, vomiting, diarrhea and AMS admitted for further management of NSTEMI, CAD, HTN, flu A, pneumonia and strep bacteremia. PMH includes non-STEMI coronary disease (s/p previous stents to prox and mid LAD in remote past, s/p PCI and stent to distal RCA and overlapping prox RCA 06/2022) diabetes, HTN, obesity, HLD.     Clinical Impressions Pt was seen for OT evaluation this date. Prior to hospital admission, pt reports living with his wife and dogs and being fully IND with no AD use for all activities including driving, taking dogs to the vet, etc. He is only oriented to self and place on eval, however then stated his house was in [place]. He then later stated he is from [place], but lives in [place]. States it is September of 2025.   Pt presents to acute OT demonstrating impaired ADL performance and functional mobility 2/2 weakness, balance deficits, low activity tolerance and impaired cognition. Pt found with nursing student in room and bed soiled with BM. Able to roll in bed with CGA/SBA using bed rails for hygiene to be performed total assist. Pt currently requires Max A for bed mobility, once upright achieved balance with SBA. STS from EOB with it slightly elevated with Min/Mod A. He performed SPT to [facility] with Min/Mod A for safety d/t management of lines/leads. Linens changed and recliner set up while pt continued to urinate and have BM on BSC. STS from [facility] with Min/Mod A, verb cues for hand placement. External rotation of BLEs/feet taking increased time to stand and achieve balance. Max A for hygiene/peri-care, Min A for gown change. HR up to 96 and sp02 95% on 3L. Pt took a few steps to get to recliner with Min A x2. Additional recliner>BSC transfer performed and NT aware of  pt on BSC.  Pt would benefit from skilled OT services to address noted impairments and functional limitations (see below for any additional details) in order to maximize safety and independence while minimizing falls risk and caregiver burden. Do anticipate the need for follow up OT services upon acute hospital DC.      If plan is discharge home, recommend the following:   A little help with walking and/or transfers;A lot of help with bathing/dressing/bathroom;Assistance with cooking/housework;Assist for transportation;Direct supervision/assist for financial management;Supervision due to cognitive status;Direct supervision/assist for medications management;Help with stairs or ramp for entrance     Functional Status Assessment   Patient has had a recent decline in their functional status and demonstrates the ability to make significant improvements in function in a reasonable and predictable amount of time.     Equipment Recommendations   Other (comment) (RW?)     Recommendations for Other Services         Precautions/Restrictions   Precautions Precautions: Fall Recall of Precautions/Restrictions: Impaired Restrictions Weight Bearing Restrictions Per Provider Order: No     Mobility Bed Mobility Overal bed mobility: Needs Assistance Bed Mobility: Supine to Sit     Supine to sit: HOB elevated, Used rails, Max assist     General bed mobility comments: nursing student in room assisting-hard to tell how much assist was needed    Transfers Overall transfer level: Needs assistance Equipment used: None Transfers: Sit to/from Stand, Bed to chair/wheelchair/BSC Sit to Stand: Min assist, Mod assist  Step pivot transfers: Min assist     General transfer comment: Min/Mod A for STS from EOB, recliner, and BSC      Balance Overall balance assessment: Needs assistance Sitting-balance support: Bilateral upper extremity supported, Feet supported Sitting  balance-Leahy Scale: Fair       Standing balance-Leahy Scale: Poor Standing balance comment: Min A to steady in standing for transfer                           ADL either performed or assessed with clinical judgement   ADL Overall ADL's : Needs assistance/impaired                 Upper Body Dressing : Minimal assistance;Sitting Upper Body Dressing Details (indicate cue type and reason): to doff/don gown     Toilet Transfer: BSC/3in1;Stand-pivot;Minimal assistance Toilet Transfer Details (indicate cue type and reason): Min A for SPT to recliner from EOB, BSC<>recliner Toileting- Clothing Manipulation and Hygiene: Maximal assistance;Total assistance;Sit to/from stand               Vision         Perception         Praxis         Pertinent Vitals/Pain Pain Assessment Pain Assessment: Faces Faces Pain Scale: Hurts a little bit Pain Location: L hand Pain Descriptors / Indicators: Sore Pain Intervention(s): Monitored during session     Extremity/Trunk Assessment Upper Extremity Assessment Upper Extremity Assessment: Generalized weakness   Lower Extremity Assessment Lower Extremity Assessment: Generalized weakness (noted external rotation of BLEs-more noticeable on RLE)       Communication Communication Communication: No apparent difficulties   Cognition Arousal: Alert Behavior During Therapy: WFL for tasks assessed/performed Cognition: Cognition impaired   Orientation impairments: Person, Place                           Following commands: Impaired Following commands impaired: Only follows one step commands consistently     Cueing  General Comments   Cueing Techniques: Verbal cues;Tactile cues      Exercises Other Exercises Other Exercises: Edu on role of OT in acute setting and importance of therapy.   Shoulder Instructions      Home Living Family/patient expects to be discharged to:: Private residence Living  Arrangements: Spouse/significant other Available Help at Discharge: Family;Available 24 hours/day;Available PRN/intermittently Type of Home: House                                  Prior Functioning/Environment Prior Level of Function : Independent/Modified Independent;Driving             Mobility Comments: reports no AD use and IND with mobility, driving and community mobile taking the dogs to the vet, etc. ADLs Comments: reports IND, although pt is confused    OT Problem List: Decreased strength;Decreased activity tolerance;Impaired balance (sitting and/or standing);Decreased cognition   OT Treatment/Interventions: Self-care/ADL training;Therapeutic exercise;Patient/family education;Balance training;Energy conservation;Therapeutic activities;DME and/or AE instruction      OT Goals(Current goals can be found in the care plan section)   Acute Rehab OT Goals Patient Stated Goal: return home OT Goal Formulation: With patient Time For Goal Achievement: 09/11/23 Potential to Achieve Goals: Fair ADL Goals Pt Will Perform Lower Body Bathing: with supervision;sit to/from stand;sitting/lateral leans Pt Will Perform Lower Body Dressing: with supervision;sitting/lateral leans;sit to/from stand  Pt Will Transfer to Toilet: with supervision;ambulating;regular height toilet Pt Will Perform Toileting - Clothing Manipulation and hygiene: with supervision;sit to/from stand;sitting/lateral leans   OT Frequency:  Min 1X/week    Co-evaluation              AM-PAC OT "6 Clicks" Daily Activity     Outcome Measure Help from another person eating meals?: None Help from another person taking care of personal grooming?: A Little Help from another person toileting, which includes using toliet, bedpan, or urinal?: A Lot Help from another person bathing (including washing, rinsing, drying)?: A Lot Help from another person to put on and taking off regular upper body clothing?: A  Little Help from another person to put on and taking off regular lower body clothing?: A Lot 6 Click Score: 16   End of Session Equipment Utilized During Treatment: Oxygen Nurse Communication: Mobility status  Activity Tolerance: Patient tolerated treatment well Patient left: with call bell/phone within reach (on [facility] with NT aware)  OT Visit Diagnosis: Other abnormalities of gait and mobility (R26.89);Unsteadiness on feet (R26.81);Muscle weakness (generalized) (M62.81)                Time: 1610-9604 OT Time Calculation (min): 44 min Charges:  OT General Charges $OT Visit: 1 Visit OT Evaluation $OT Eval Moderate Complexity: 1 Mod OT Treatments $Self Care/Home Management : 23-37 mins Natalya Domzalski, OTR/L  08/28/23, 1:36 PM  Sojourner Behringer E Jacquelene Kopecky 08/28/2023, 1:30 PM

## 2023-08-28 NOTE — Evaluation (Signed)
Physical Therapy Evaluation Patient Details Name: Connor Ross MRN: [id number] DOB: 11-08-1965 Today's Date: 08/28/2023  History of Present Illness  Pt is a 58 y.o. male presenting to hospital 08/25/23 with c/o fever and AMS; also N/V/D.  PMH includes DM, htn, diabetic ulcer L toe, lumbar disc surgery.  Pt admitted with NSTEMI, sepsis d/t PNA, (+) influenza A, hypokalemia, metabolic acidosis, L hand cellulitis.  Clinical Impression  Prior to recent medical concerns, pt reports being independent with functional mobility; lives with his wife in split level home; steps to enter and navigate home.  Pt oriented to person, place, and general situation; pt able to state 2025 but also reported it was November.  Currently pt is min assist with sit to stand from recliner and transfer to bed (no AD use) but improved to CGA with sit to stand from bed and transfer back to recliner with RW use.  Pt demonstrating generalized weakness and decreased activity tolerance from baseline.  Pt would currently benefit from skilled PT to address noted impairments and functional limitations (see below for any additional details).  Upon hospital discharge, pt would benefit from ongoing therapy.     If plan is discharge home, recommend the following: A little help with walking and/or transfers;A little help with bathing/dressing/bathroom;Assistance with cooking/housework;Assist for transportation;Help with stairs or ramp for entrance   Can travel by private vehicle        Equipment Recommendations Rolling walker (2 wheels);BSC/3in1  Recommendations for Other Services       Functional Status Assessment Patient has had a recent decline in their functional status and demonstrates the ability to make significant improvements in function in a reasonable and predictable amount of time.     Precautions / Restrictions Precautions Precautions: Fall Recall of Precautions/Restrictions: Impaired Restrictions Weight  Bearing Restrictions Per Provider Order: No      Mobility  Bed Mobility               General bed mobility comments: Deferred (pt in recliner beginning/end of session)    Transfers Overall transfer level: Needs assistance Equipment used: None, Rolling walker (2 wheels) Transfers: Sit to/from Stand, Bed to chair/wheelchair/BSC Sit to Stand: Min assist, Contact guard assist   Step pivot transfers: Min assist, Contact guard assist       General transfer comment: Min assist to stand from recliner (no AD use) and transfer recliner to bed; CGA to stand from bed (with RW use) and transfer bed to recliner; vc's for UE placement with walker use; pt stands with feet partially crossed and externally rotated    Ambulation/Gait               General Gait Details: Deferred d/t pt fatigue/generalized weakness  Stairs            Wheelchair Mobility     Tilt Bed    Modified Rankin (Stroke Patients Only)       Balance Overall balance assessment: Needs assistance Sitting-balance support: No upper extremity supported, Feet supported Sitting balance-Leahy Scale: Good Sitting balance - Comments: steady reaching within BOS   Standing balance support: No upper extremity supported Standing balance-Leahy Scale: Poor Standing balance comment: assist to steady during standing activities without UE support                             Pertinent Vitals/Pain Pain Assessment Pain Assessment: Faces Faces Pain Scale: Hurts a little bit Pain Location: L hand  Pain Descriptors / Indicators: Sore Pain Intervention(s): Limited activity within patient's tolerance, Monitored during session, Repositioned Vitals (HR and SpO2 on 3 L O2 via nasal cannula) stable and WFL throughout treatment session.     Home Living Family/patient expects to be discharged to:: Private residence Living Arrangements: Spouse/significant other Available Help at Discharge: Family;Available 24  hours/day;Available PRN/intermittently Type of Home: House Home Access: Stairs to enter Entrance Stairs-Rails: Right;Left;Can reach both Entrance Stairs-Number of Steps: 4 Alternate Level Stairs-Number of Steps: 4-6 Home Layout: Multi-level (split level) Home Equipment: Grab bars - toilet;Grab bars - tub/shower Additional Comments: Has dogs    Prior Function Prior Level of Function : Independent/Modified Independent;Driving             Mobility Comments: Independent with functional mobility; pt reports 1 recent fall (pt unable to describe though) ADLs Comments: Pt reports being independent with ADL's     Extremity/Trunk Assessment   Upper Extremity Assessment Upper Extremity Assessment: Generalized weakness    Lower Extremity Assessment Lower Extremity Assessment: Generalized weakness (R>L LE external rotation)    Cervical / Trunk Assessment Cervical / Trunk Assessment: Other exceptions Cervical / Trunk Exceptions: forward head/shoulders  Communication   Communication Communication: No apparent difficulties    Cognition Arousal: Alert Behavior During Therapy: WFL for tasks assessed/performed   PT - Cognitive impairments: No family/caregiver present to determine baseline, Awareness, Memory, Attention                       PT - Cognition Comments: A&O to person, place, general situation, and year (pt reported it was November) Following commands: Impaired Following commands impaired: Only follows one step commands consistently, Follows one step commands with increased time     Cueing Cueing Techniques: Verbal cues, Tactile cues     General Comments General comments (skin integrity, edema, etc.): HR 96 and sp02 95% on 3L following transfers.  Pt agreeable to PT session.    Exercises     Assessment/Plan    PT Assessment Patient needs continued PT services  PT Problem List Decreased strength;Decreased activity tolerance;Decreased balance;Decreased  mobility;Decreased cognition;Decreased knowledge of precautions;Cardiopulmonary status limiting activity;Pain       PT Treatment Interventions DME instruction;Gait training;Stair training;Functional mobility training;Therapeutic activities;Therapeutic exercise;Balance training;Patient/family education    PT Goals (Current goals can be found in the Care Plan section)  Acute Rehab PT Goals Patient Stated Goal: to improve strength and walking PT Goal Formulation: With patient Time For Goal Achievement: 09/11/23 Potential to Achieve Goals: Good    Frequency Min 1X/week     Co-evaluation               AM-PAC PT "6 Clicks" Mobility  Outcome Measure Help needed turning from your back to your side while in a flat bed without using bedrails?: None Help needed moving from lying on your back to sitting on the side of a flat bed without using bedrails?: A Little Help needed moving to and from a bed to a chair (including a wheelchair)?: A Little Help needed standing up from a chair using your arms (e.g., wheelchair or bedside chair)?: A Little Help needed to walk in hospital room?: A Little Help needed climbing 3-5 steps with a railing? : A Lot 6 Click Score: 18    End of Session Equipment Utilized During Treatment: Oxygen (3 L via nasal cannula) Activity Tolerance: Patient limited by fatigue Patient left: in chair;with call bell/phone within reach;with chair alarm set Nurse Communication: Mobility status;Precautions  PT Visit Diagnosis: Unsteadiness on feet (R26.81);Other abnormalities of gait and mobility (R26.89);Muscle weakness (generalized) (M62.81);History of falling (Z91.81);Pain Pain - Right/Left: Left Pain - part of body: Hand    Time: 0981-1914 PT Time Calculation (min) (ACUTE ONLY): 21 min   Charges:   PT Evaluation $PT Eval Low Complexity: 1 Low PT Treatments $Therapeutic Activity: 8-22 mins PT General Charges $$ ACUTE PT VISIT: 1 Visit        Hendricks Limes,  PT 08/28/23, 3:51 PM

## 2023-08-28 NOTE — Progress Notes (Signed)
 Date of Admission:  08/25/2023      ID: Connor Ross is a 58 y.o. male  Principal Problem:   NSTEMI (non-ST elevated myocardial infarction) (HCC) Active Problems:   Sepsis due to pneumonia (HCC)   Hypokalemia   Metabolic acidosis   Type 2 diabetes mellitus with peripheral neuropathy (HCC)   Essential hypertension   BPH (benign prostatic hyperplasia)   Influenza A   Hypocalcemia   Septicemia due to group D Streptococcus (HCC)   Bacteremia due to group B Streptococcus   Sepsis due to group B Streptococcus (HCC)    Subjective: Pt says he is feeling a bit better today Left hand hurts and movt at wrist is painful  Medications:   aspirin EC  81 mg Oral Daily   calcium-vitamin D  2 tablet Oral TID   clopidogrel  75 mg Oral Daily   empagliflozin  25 mg Oral Daily   furosemide  20 mg Oral QODAY   gabapentin  100 mg Oral TID   guaiFENesin  600 mg Oral BID   losartan  100 mg Oral Daily   metoprolol tartrate  50 mg Oral BID   multivitamin with minerals  1 tablet Oral Daily   oseltamivir  75 mg Oral BID   pantoprazole  40 mg Oral Daily   phosphorus  500 mg Oral TID   predniSONE  40 mg Oral Q breakfast   rosuvastatin  40 mg Oral QPM   tamsulosin  0.4 mg Oral Daily   Vitamin D (Ergocalciferol)  50,000 Units Oral Weekly    Objective: Vital signs in last 24 hours: Patient Vitals for the past 24 hrs:  BP Temp Temp src Pulse Resp SpO2  08/28/23 1825 (!) 150/80 98.4 F (36.9 C) Oral 82 -- 98 %  08/28/23 1109 (!) 146/89 98.7 F (37.1 C) Oral 77 18 96 %  08/28/23 0903 (!) 145/72 -- -- 80 -- --  08/28/23 0824 (!) 145/72 99 F (37.2 C) Oral 80 -- 94 %  08/28/23 0753 (!) 140/79 98.8 F (37.1 C) Oral 80 19 95 %  08/28/23 0244 (!) 145/60 99.5 F (37.5 C) Oral 74 18 94 %  08/27/23 2335 110/81 98.3 F (36.8 C) -- 87 18 96 %  08/27/23 2045 (!) 140/77 98.8 F (37.1 C) Oral 85 20 95 %      PHYSICAL EXAM:  General: Alert, cooperative, no distress, appears stated age.  Still has some memory deficit Head: Normocephalic, without obvious abnormality, atraumatic. Eyes: Conjunctivae clear, anicteric sclerae. Pupils are equal ENT Nares normal. No drainage or sinus tenderness. Lips, mucosa, and tongue normal. No Thrush Neck: Supple, symmetrical, no adenopathy, thyroid: non tender no carotid bruit and no JVD. Back: No CVA tenderness. Lungs: Clear to auscultation bilaterally. No Wheezing or Rhonchi. No rales. Heart: Regular rate and rhythm, no murmur, rub or gallop. Abdomen: Soft, non-tender,not distended. Bowel sounds normal. No masses Extremities:left hand dorsum swollen , erythematous  Skin: No rashes or lesions. Or bruising Lymph: Cervical, supraclavicular normal. Neurologic: Grossly non-focal  Lab Results    Latest Ref Rng & Units 08/28/2023    2:16 AM 08/27/2023    2:51 AM 08/26/2023    4:57 AM  CBC  WBC 4.0 - 10.5 K/uL 10.1  10.6  12.7   Hemoglobin 13.0 - 17.0 g/dL 57.8  46.9  62.9   Hematocrit 39.0 - 52.0 % 37.2  39.2  41.6   Platelets 150 - 400 K/uL 138  143  167  Latest Ref Rng & Units 08/28/2023    2:16 AM 08/27/2023    2:09 PM 08/27/2023    2:51 AM  CMP  Glucose 70 - 99 mg/dL 161  096  045   BUN 6 - 20 mg/dL 13  13  13    Creatinine 0.61 - 1.24 mg/dL 4.09  8.11  9.14   Sodium 135 - 145 mmol/L 133  133  135   Potassium 3.5 - 5.1 mmol/L 3.3  3.0  2.7   Chloride 98 - 111 mmol/L 97  97  96   CO2 22 - 32 mmol/L 23  25  26    Calcium 8.9 - 10.3 mg/dL 8.7  8.1  8.9       Microbiology: 08/25/23 4/4 GBS - but taken from the same site Studies/Results: ECHOCARDIOGRAM COMPLETE Result Date: 08/27/2023    ECHOCARDIOGRAM REPORT   Patient Name:   Connor Ross [facility] Date of Exam: 08/27/2023 Medical Rec #:  [id number]             Height:       71.0 in Accession #:    [id number]            Weight:       290.2 lb Date of Birth:  1965/08/27             BSA:          2.470 m Patient Age:    57 years              BP:           129/71 mmHg Patient  Gender: M                     HR:           84 bpm. Exam Location:  [facility] Procedure: 2D Echo, Cardiac Doppler, Color Doppler and Intracardiac            Opacification Agent (Both Spectral and Color Flow Doppler were            utilized during procedure). Indications:     NSTEMI  History:         Patient has prior history of Echocardiogram examinations, most                  recent 06/24/2022. Acute MI and Previous Myocardial Infarction;                  Risk Factors:Hypertension, Diabetes and Sleep Apnea. Infulenza                  +.  Sonographer:     Mikki Harbor Referring Phys:  [id number] JAN A MANSY Diagnosing Phys: Alwyn Pea MD  Sonographer Comments: Technically difficult study due to poor echo windows and patient is obese. IMPRESSIONS  1. Left ventricular ejection fraction, by estimation, is 65 to 70%. The left ventricle has normal function. The left ventricle has no regional wall motion abnormalities. There is moderate concentric left ventricular hypertrophy. Left ventricular diastolic parameters are consistent with Grade II diastolic dysfunction (pseudonormalization).  2. Right ventricular systolic function is normal. The right ventricular size is normal.  3. The mitral valve is normal in structure. Trivial mitral valve regurgitation.  4. The aortic valve is normal in structure. Aortic valve regurgitation is not visualized. FINDINGS  Left Ventricle: Left ventricular ejection fraction, by estimation, is 65 to 70%. The left ventricle has normal function. The left ventricle has no  regional wall motion abnormalities. Definity contrast agent was given IV to delineate the left ventricular  endocardial borders. Strain imaging was not performed. The left ventricular internal cavity size was normal in size. There is moderate concentric left ventricular hypertrophy. Left ventricular diastolic parameters are consistent with Grade II diastolic dysfunction (pseudonormalization). Right Ventricle: The right  ventricular size is normal. No increase in right ventricular wall thickness. Right ventricular systolic function is normal. Left Atrium: Left atrial size was normal in size. Right Atrium: Right atrial size was normal in size. Pericardium: There is no evidence of pericardial effusion. Mitral Valve: The mitral valve is normal in structure. There is moderate thickening of the mitral valve leaflet(s). There is moderate calcification of the mitral valve leaflet(s). Normal mobility of the mitral valve leaflets. Trivial mitral valve regurgitation. MV peak gradient, 8.3 mmHg. The mean mitral valve gradient is 4.0 mmHg. Tricuspid Valve: The tricuspid valve is normal in structure. Tricuspid valve regurgitation is trivial. Aortic Valve: The aortic valve is normal in structure. Aortic valve regurgitation is not visualized. Aortic valve mean gradient measures 6.0 mmHg. Aortic valve peak gradient measures 13.0 mmHg. Aortic valve area, by VTI measures 2.53 cm. Pulmonic Valve: The pulmonic valve was normal in structure. Pulmonic valve regurgitation is not visualized. Aorta: The ascending aorta was not well visualized. IAS/Shunts: No atrial level shunt detected by color flow Doppler. Additional Comments: 3D imaging was not performed.  LEFT VENTRICLE PLAX 2D LVIDd:         4.90 cm   Diastology LVIDs:         2.90 cm   LV e' medial:    5.77 cm/s LV PW:         1.50 cm   LV E/e' medial:  22.9 LV IVS:        1.40 cm   LV e' lateral:   5.98 cm/s LVOT diam:     2.10 cm   LV E/e' lateral: 22.1 LV SV:         82 LV SV Index:   33 LVOT Area:     3.46 cm  LEFT ATRIUM              Index LA diam:        4.30 cm  1.74 cm/m LA Vol (A2C):   109.0 ml 44.13 ml/m LA Vol (A4C):   91.6 ml  37.09 ml/m LA Biplane Vol: 102.0 ml 41.30 ml/m  AORTIC VALVE                     PULMONIC VALVE AV Area (Vmax):    2.16 cm      PV Vmax:       1.01 m/s AV Area (Vmean):   2.39 cm      PV Peak grad:  4.1 mmHg AV Area (VTI):     2.53 cm AV Vmax:            180.00 cm/s AV Vmean:          113.000 cm/s AV VTI:            0.326 m AV Peak Grad:      13.0 mmHg AV Mean Grad:      6.0 mmHg LVOT Vmax:         112.00 cm/s LVOT Vmean:        78.100 cm/s LVOT VTI:          0.238 m LVOT/AV VTI ratio: 0.73  AORTA Ao Root diam:  4.10 cm MITRAL VALVE MV Area (PHT): 3.50 cm     SHUNTS MV Area VTI:   2.37 cm     Systemic VTI:  0.24 m MV Peak grad:  8.3 mmHg     Systemic Diam: 2.10 cm MV Mean grad:  4.0 mmHg MV Vmax:       1.44 m/s MV Vmean:      94.0 cm/s MV Decel Time: 217 msec MV E velocity: 132.00 cm/s MV A velocity: 132.00 cm/s MV E/A ratio:  1.00 Dwayne D Callwood MD Electronically signed by Alwyn Pea MD Signature Date/Time: 08/27/2023/7:55:21 PM    Final      Assessment/Plan:   58 year old male presenting with confusion, vomiting and diarrhea and pain left hand and fever   Influenza a viral illness on Tamiflu   Streptococcus group G bacteremia Likely source is the left hand cellulitis The source of the cellulitis is likely from the fingersticks he does for diabetic checkup on ceftriaxone Continue the same Repeat BC  Left hand cellulitis. Improving- able to move the wrist much better  2d echo shows mitral valve leaflet thickening GBS is nto a common bacteria to cause endocarditis So will hold of TEE for now   Encephalopathy - improving ? Due to flu Not resolved completely- some memory issues- ? MRI brain with contrast   Diabetes mellitus   NSTEMI on admission Increasing troponin this admission.  Seen by cardiology.on Iv heparin CAD status post stents   Discussed the management with the patient and hospitalist

## 2023-08-28 NOTE — Consult Note (Signed)
 PHARMACY - ANTICOAGULATION CONSULT NOTE  Pharmacy Consult for Heparin Indication: chest pain/ACS  Allergies  Allergen Reactions   Statins     Other Reaction(s): arthralgia (joint pain) Patient able to tolerate Crestor 10 mg   Hctz [Hydrochlorothiazide] Other (See Comments)    Extreme Cramping   Metformin And Related Diarrhea    Patient Measurements: Height: 5\' 11"  (180.3 cm) IBW/kg (Calculated) : 75.3 Heparin Dosing Weight: 105.4 kg  Vital Signs: Temp: 98.7 F (37.1 C) (02/13 1109) Temp Source: Oral (02/13 1109) BP: 146/89 (02/13 1109) Pulse Rate: 77 (02/13 1109)  Labs: Recent Labs    08/26/23 0024 08/26/23 0226 08/26/23 0457 08/26/23 0829 08/26/23 1120 08/26/23 1250 08/26/23 1443 08/26/23 2356 08/27/23 0251 08/27/23 0841 08/27/23 1409 08/27/23 1805 08/28/23 0216 08/28/23 1222  HGB  --   --  13.7  --   --   --   --   --  13.5  --   --   --  12.8*  --   HCT  --   --  41.6  --   --   --   --   --  39.2  --   --   --  37.2*  --   PLT  --   --  167  --   --   --   --   --  143*  --   --   --  138*  --   APTT 33  --   --   --   --   --   --  >200* 34  --   --   --   --   --   LABPROT 14.4  --   --   --   --   --   --   --   --   --   --   --   --   --   INR 1.1  --   --   --   --   --   --   --   --   --   --   --   --   --   HEPARINUNFRC  --   --  0.44  --   --   --    < > >1.10*  --    < >  --  0.11* 0.18* 0.21*  CREATININE  --   --  0.53*  --   --   --   --   --  0.69  --  0.62  --  0.57*  --   TROPONINIHS  --    < > 607* 1,071* 1,226* 817*  --   --   --   --   --   --   --   --    < > = values in this interval not displayed.    Estimated Creatinine Clearance: 137.5 mL/min (A) (by C-G formula based on SCr of 0.57 mg/dL (L)).   Medical History: Past Medical History:  Diagnosis Date   Diabetes mellitus    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 type 2 diabetes mellitus, limited to breakdown of skin (HCC) 04/23/2019   Fracture of one rib, left  side, subsequent encounter for fracture with routine healing 08/27/2020   Hyperlipidemia    Hypertension    Normal cardiac stress test 2012   Fath    Medications:  No history of chronic anticoagulant use PTA  Assessment: 58yo patient presents to the ED  via [facility] from home. Pt arrives with fever, AMS, N/V/D, abdominal pain, and increased urination. He was seen by his doctor today and was at his baseline mental status at that time but subsequent became confused. The patient himself states he feels relatively okay. Troponin levels of 89>272 and EKG showing ST elevation. Pharmacy has been consulted to initiate and monitor continuous heparin infusion.   Date Time HL Rate/Comment  2/11 0457 0.44 Therapeutic x1 at 1500un/hr, HL drawn early this AM, repeat sample was hemolyzed, third sample sent late 2/11 1443 0.33 Therapeutic x2 2/12 0100  Infusion HELD and protamine given, received 250,000 u in 2 hrs 2/12 1805 0.11 Subtherapeutic, Heparin resumed at 11:55, rate 1500 un/hr>1600 2/13 0216 0.18 Subtherapeutic@1600  units/hr 2/13 1222  0.21   Goal of Therapy:  Heparin level 0.3-0.7 units/ml Monitor platelets by anticoagulation protocol: Yes   Plan: Heparin level is subtherapeutic. Will give heparin bolus of 3000 units and increase heparin to 2200 units/hr. Recheck heparin level in 6 hours. CBC daily while on heparin.   Ronnald Ramp, [name] Clinical Pharmacist 08/28/2023 12:59 PM

## 2023-08-28 NOTE — Consult Note (Signed)
PHARMACY - ANTICOAGULATION CONSULT NOTE  Pharmacy Consult for Heparin Indication: chest pain/ACS  Allergies  Allergen Reactions   Statins     Other Reaction(s): arthralgia (joint pain) Patient able to tolerate Crestor 10 mg   Hctz [Hydrochlorothiazide] Other (See Comments)    Extreme Cramping   Metformin And Related Diarrhea    Patient Measurements: Height: 5\' 11"  (180.3 cm) IBW/kg (Calculated) : 75.3 Heparin Dosing Weight: 105.4 kg  Vital Signs: Temp: 98.3 F (36.8 C) (02/12 2335) Temp Source: Oral (02/12 2045) BP: 110/81 (02/12 2335) Pulse Rate: 87 (02/12 2335)  Labs: Recent Labs    08/26/23 0024 08/26/23 0226 08/26/23 0457 08/26/23 0829 08/26/23 1120 08/26/23 1250 08/26/23 1443 08/26/23 2356 08/27/23 0251 08/27/23 0841 08/27/23 1409 08/27/23 1805 08/28/23 0216  HGB  --   --  13.7  --   --   --   --   --  13.5  --   --   --  12.8*  HCT  --   --  41.6  --   --   --   --   --  39.2  --   --   --  37.2*  PLT  --   --  167  --   --   --   --   --  143*  --   --   --  138*  APTT 33  --   --   --   --   --   --  >200* 34  --   --   --   --   LABPROT 14.4  --   --   --   --   --   --   --   --   --   --   --   --   INR 1.1  --   --   --   --   --   --   --   --   --   --   --   --   HEPARINUNFRC  --   --  0.44  --   --   --    < > >1.10*  --  <0.10*  --  0.11*  --   CREATININE  --   --  0.53*  --   --   --   --   --  0.69  --  0.62  --   --   TROPONINIHS  --    < > 607* 1,071* 1,226* 817*  --   --   --   --   --   --   --    < > = values in this interval not displayed.    Estimated Creatinine Clearance: 137.5 mL/min (by C-G formula based on SCr of 0.62 mg/dL).   Medical History: Past Medical History:  Diagnosis Date   Diabetes mellitus    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 type 2 diabetes mellitus, limited to breakdown of skin (HCC) 04/23/2019   Fracture of one rib, left side, subsequent encounter for fracture with routine healing 08/27/2020    Hyperlipidemia    Hypertension    Normal cardiac stress test 2012   Fath    Medications:  No history of chronic anticoagulant use PTA  Assessment: 58yo patient presents to the ED via [facility] from home. Pt arrives with fever, AMS, N/V/D, abdominal pain, and increased urination. He was seen by his doctor today and was at his baseline mental  status at that time but subsequent became confused. The patient himself states he feels relatively okay. Troponin levels of 89>272 and EKG showing ST elevation. Pharmacy has been consulted to initiate and monitor continuous heparin infusion.   Date Time HL Rate/Comment  2/11 0457 0.44 Therapeutic x1 at 1500un/hr, HL drawn early this AM, repeat sample was hemolyzed, third sample sent late 2/11 1443 0.33 Therapeutic x2 2/12 0100  Infusion HELD and protamine given, received 250,000 u in 2 hrs 2/12 1805 0.11 Subtherapeutic, Heparin resumed at 11:55, rate 1500 un/hr>1600 2/13 0216 0.18 Subtherapeutic@1600  units/hr  Goal of Therapy:  Heparin level 0.3-0.7 units/ml Monitor platelets by anticoagulation protocol: Yes   Plan: Bolus with heparin 3000 units IV x 1 Increase heparin infusion rate to 1900 units/hr Repeat HL 6hrs after rate change. CBC daily while on heparin infusion.  Bettey Costa, [name] Clinical Pharmacist 08/28/2023 2:48 AM

## 2023-08-28 NOTE — Progress Notes (Signed)
 [facility] CLINIC CARDIOLOGY PROGRESS NOTE       Patient ID: Connor Ross MRN: [id number] DOB/AGE: Jul 05, 1966 58 y.o.  Admit date: 08/25/2023 Referring Physician Dr. Valente David Primary Physician Sherlene Shams, MD  Primary Cardiologist Dr. Toy Baker, PA Reason for Consultation Elevated troponin  HPI: Connor Ross is a 58 y.o. male  with a past medical history of non-STEMI coronary disease (s/p previous stents to prox and mid LAD in remote past, s/p PCI and stent to distal RCA and overlapping prox RCA 06/2022) diabetes, hypertension, obesity, hyperlipidemia who presented to the ED on 08/25/2023 for fever, vomiting, diarrhea. Troponins checked and found to be elevated. Cardiology was consulted for further evaluation.   Interval history: -Patient reports that he is feeling well overall this morning. -Denies any significant shortness of breath symptoms but remains on 3L [employer]. -He denies any chest pain or palpitation symptoms. -BP and heart rate remain well-controlled. -Potassium slightly improved but remains low after supplementation yesterday.  Review of systems complete and found to be negative unless listed above    Past Medical History:  Diagnosis Date   Diabetes mellitus    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 type 2 diabetes mellitus, limited to breakdown of skin (HCC) 04/23/2019   Fracture of one rib, left side, subsequent encounter for fracture with routine healing 08/27/2020   Hyperlipidemia    Hypertension    Normal cardiac stress test 2012   [facility]    Past Surgical History:  Procedure Laterality Date   CORONARY STENT INTERVENTION N/A 06/25/2022   Procedure: CORONARY STENT INTERVENTION;  Surgeon: Alwyn Pea, MD;  Location: [facility] INVASIVE CV LAB;  Service: Cardiovascular;  Laterality: N/A;   CORONARY STENT INTERVENTION N/A 06/26/2022   Procedure: CORONARY STENT INTERVENTION;  Surgeon: Alwyn Pea, MD;  Location: [facility]  INVASIVE CV LAB;  Service: Cardiovascular;  Laterality: N/A;   EXOSTECTOMY Right 06/2019   5th MT head  ([facility])   LEFT HEART CATH AND CORONARY ANGIOGRAPHY N/A 06/25/2022   Procedure: LEFT HEART CATH AND CORONARY ANGIOGRAPHY WITH POSSIBLE STENT;  Surgeon: Alwyn Pea, MD;  Location: [facility] INVASIVE CV LAB;  Service: Cardiovascular;  Laterality: N/A;  12:30 following DC first case   LUMBAR DISC SURGERY      Medications Prior to Admission  Medication Sig Dispense Refill Last Dose/Taking   ASPIRIN LOW DOSE 81 MG EC tablet TAKE 1 TABLET BY MOUTH  DAILY (SWALLOW WHOLE) 90 tablet 1 08/24/2023   empagliflozin (JARDIANCE) 25 MG TABS tablet Take 1 tablet (25 mg total) by mouth daily. 90 tablet 3 08/24/2023   fluticasone (FLONASE) 50 MCG/ACT nasal spray PLACE 2 SPRAYS INTO BOTH NOSTRILS DAILY. AT NIGHT AFTER NASAL SALINE 16 g 2 Taking   gabapentin (NEURONTIN) 100 MG capsule TAKE 2 CAPSULES THREE TIMES DAILY 540 capsule 3 08/24/2023   insulin aspart (NOVOLOG FLEXPEN) 100 UNIT/ML FlexPen Inject up to 25 units with breakfast, 25 units with lunch, and 30 units with supper 72 mL 2 08/25/2023   insulin degludec (TRESIBA FLEXTOUCH) 200 UNIT/ML FlexTouch Pen Inject 88 Units into the skin daily. (Patient taking differently: Inject 86 Units into the skin daily.) 48 mL 2 08/25/2023   ketoconazole (NIZORAL) 2 % cream SMARTSIG:1 Application Topical 1 to 2 Times Daily   Past Week   Multiple Vitamin (MULTIVITAMIN) tablet Take 1 tablet by mouth daily.   08/24/2023   tamsulosin (FLOMAX) 0.4 MG CAPS capsule TAKE 1 CAPSULE EVERY DAY 90 capsule 3 08/24/2023  traMADol (ULTRAM) 50 MG tablet Take 1 tablet (50 mg total) by mouth every 6 (six) hours as needed. 360 tablet 1 Taking As Needed   triamcinolone (KENALOG) 0.025 % cream Apply topically.   08/24/2023   Upadacitinib ER (RINVOQ) 15 MG TB24 Take 15 mg by mouth daily.   08/24/2023   Vitamin D, Ergocalciferol, (DRISDOL) 1.25 MG (50000 UNIT) CAPS capsule Take 50,000 Units by mouth  once a week.   Taking   blood glucose meter kit and supplies Dispense based on patient and insurance preference. Use up to four times daily as directed. (FOR ICD-10 E10.9, E11.9). Use to check blood sugar three times daily 1 each 0    cefadroxil (DURICEF) 500 MG capsule Take 1 capsule (500 mg total) by mouth 2 (two) times daily. (Patient not taking: Reported on 08/26/2023) 14 capsule 0 Not Taking   clobetasol cream (TEMOVATE) 0.05 % Apply topically 2 (two) times daily. (Patient not taking: Reported on 08/26/2023)   Not Taking   clopidogrel (PLAVIX) 75 MG tablet Take 75 mg by mouth daily. (Patient not taking: Reported on 08/26/2023)   Not Taking   Continuous Blood Gluc Sensor (FREESTYLE LIBRE 2 SENSOR) MISC Use to check sugar at least 4 times daily 1 each 2    furosemide (LASIX) 20 MG tablet TAKE 1 TABLET EVERY OTHER DAY (Patient not taking: Reported on 08/26/2023) 45 tablet 3 Not Taking   Insulin Pen Needle 32G X 6 MM MISC Use daily with insulin pen 50 each 0    Lancets (FREESTYLE) lancets 1 each by Other route 2 (two) times daily. DX 250.02 100 each 12    losartan (COZAAR) 100 MG tablet TAKE 1 TABLET EVERY DAY 90 tablet 3    metoprolol tartrate (LOPRESSOR) 50 MG tablet Take 1 tablet (50 mg total) by mouth 2 (two) times daily. (Patient not taking: Reported on 08/26/2023) 180 tablet 1 Not Taking   ondansetron (ZOFRAN-ODT) 4 MG disintegrating tablet Take 1 tablet (4 mg total) by mouth every 8 (eight) hours as needed for nausea or vomiting. (Patient not taking: Reported on 08/26/2023) 20 tablet 0 Not Taking   ONETOUCH VERIO test strip USE TO CHECK BLOOD SUGAR 3  TIMES DAILY 300 strip 3    potassium chloride SA (KLOR-CON M) 20 MEQ tablet Take 2 tablets (40 mEq total) by mouth daily for 3 days. 6 tablet 0    rosuvastatin (CRESTOR) 40 MG tablet Take 1 tablet (40 mg total) by mouth every evening for 360 doses. (Patient not taking: Reported on 08/26/2023) 90 tablet 3 Not Taking   Semaglutide, 2 MG/DOSE, (OZEMPIC, 2  MG/DOSE,) 8 MG/3ML SOPN Inject 2 mg into the skin once a week. (Patient not taking: Reported on 08/26/2023) 8 mL 2 Not Taking   Social History   Socioeconomic History   Marital status: Married    Spouse name: Not on file   Number of children: Not on file   Years of education: Not on file   Highest education level: 12th grade  Occupational History   Not on file  Tobacco Use   Smoking status: Never   Smokeless tobacco: Never  Substance and Sexual Activity   Alcohol use: No   Drug use: No   Sexual activity: Not on file  Other Topics Concern   Not on file  Social History Narrative   Married   Social Drivers of Health   Financial Resource Strain: Medium Risk (06/30/2023)   Overall Financial Resource Strain (CARDIA)    Difficulty  of Paying Living Expenses: Somewhat hard  Food Insecurity: Patient Declined (08/27/2023)   Hunger Vital Sign    Worried About Running Out of Food in the Last Year: Patient declined    Ran Out of Food in the Last Year: Patient declined  Transportation Needs: Patient Declined (08/27/2023)   PRAPARE - Administrator, Civil Service (Medical): Patient declined    Lack of Transportation (Non-Medical): Patient declined  Physical Activity: Insufficiently Active (06/30/2023)   Exercise Vital Sign    Days of Exercise per Week: 2 days    Minutes of Exercise per Session: 10 min  Stress: No Stress Concern Present (06/30/2023)   [employer] of Occupational Health - Occupational Stress Questionnaire    Feeling of Stress : Not at all  Social Connections: Moderately Integrated (06/30/2023)   Social Connection and Isolation Panel [NHANES]    Frequency of Communication with Friends and Family: More than three times a week    Frequency of Social Gatherings with Friends and Family: Twice a week    Attends Religious Services: 1 to 4 times per year    Active Member of [employer] or Organizations: No    Attends Banker Meetings: Never    Marital  Status: Married  Catering manager Violence: Patient Declined (08/27/2023)   Humiliation, Afraid, Rape, and Kick questionnaire    Fear of Current or Ex-Partner: Patient declined    Emotionally Abused: Patient declined    Physically Abused: Patient declined    Sexually Abused: Patient declined    Family History  Problem Relation Age of Onset   Coronary artery disease Mother 51   COPD Mother    Heart disease Mother    Hyperlipidemia Mother    Hypertension Mother    Cancer Father    Hyperlipidemia Father    Hypertension Brother    Cancer Brother        lymphoma   Diabetes Maternal Aunt    Diabetes Maternal Uncle    Diabetes Maternal Grandmother    Heart disease Maternal Grandmother      Vitals:   08/28/23 0244 08/28/23 0753 08/28/23 0824 08/28/23 0903  BP: (!) 145/60 (!) 140/79 (!) 145/72 (!) 145/72  Pulse: 74 80 80 80  Resp: 18 19    Temp: 99.5 F (37.5 C) 98.8 F (37.1 C) 99 F (37.2 C)   TempSrc: Oral Oral Oral   SpO2: 94% 95% 94%   Height:        PHYSICAL EXAM General: Ill appearing male, well nourished, in no acute distress. HEENT: Normocephalic and atraumatic. Neck: No JVD.  Lungs: Normal respiratory effort on 3L [place]. Clear bilaterally to auscultation. No wheezes, crackles, rhonchi.  Heart: HRRR, fast rate. Normal S1 and S2 without gallops or murmurs.  Abdomen: Non-distended appearing.  Msk: Normal strength and tone for age. Extremities: Warm and well perfused. No clubbing, cyanosis. No edema.  Neuro: Alert and oriented X 3. Psych: Answers questions appropriately.   Labs: Basic Metabolic Panel: Recent Labs    08/27/23 0251 08/27/23 1409 08/28/23 0216  NA 135 133* 133*  K 2.7* 3.0* 3.3*  CL 96* 97* 97*  CO2 26 25 23   GLUCOSE 153* 177* 152*  BUN 13 13 13   CREATININE 0.69 0.62 0.57*  CALCIUM 8.9 8.1* 8.7*  MG 2.3  --  2.1  PHOS 3.5  --  2.3*   Liver Function Tests: Recent Labs    08/25/23 1156 08/25/23 2130  AST 20 22  ALT 33 33  ALKPHOS 63 54   BILITOT 0.6 1.2  PROT 7.4 7.2  ALBUMIN 4.3 3.8   Recent Labs    08/25/23 1156 08/25/23 2130  LIPASE 28.0 24   CBC: Recent Labs    08/25/23 2130 08/26/23 0457 08/27/23 0251 08/28/23 0216  WBC 9.3   < > 10.6* 10.1  NEUTROABS 8.1*  --   --   --   HGB 14.5   < > 13.5 12.8*  HCT 42.4   < > 39.2 37.2*  MCV 90.8   < > 88.1 89.2  PLT 181   < > 143* 138*   < > = values in this interval not displayed.   Cardiac Enzymes: Recent Labs    08/26/23 0829 08/26/23 1120 08/26/23 1250  TROPONINIHS 1,071* 1,226* 817*   BNP: No results for input(s): "BNP" in the last 72 hours. D-Dimer: No results for input(s): "DDIMER" in the last 72 hours. Hemoglobin A1C: No results for input(s): "HGBA1C" in the last 72 hours. Fasting Lipid Panel: Recent Labs    08/26/23 0458  CHOL 73  HDL 17*  LDLCALC 39  TRIG 83  CHOLHDL 4.3   Thyroid Function Tests: No results for input(s): "TSH", "T4TOTAL", "T3FREE", "THYROIDAB" in the last 72 hours.  Invalid input(s): "FREET3" Anemia Panel: No results for input(s): "VITAMINB12", "FOLATE", "FERRITIN", "TIBC", "IRON", "RETICCTPCT" in the last 72 hours.   Radiology: ECHOCARDIOGRAM COMPLETE Result Date: 08/27/2023    ECHOCARDIOGRAM REPORT   Patient Name:   Connor Ross [facility] Date of Exam: 08/27/2023 Medical Rec #:  [id number]             Height:       71.0 in Accession #:    [id number]            Weight:       290.2 lb Date of Birth:  September 23, 1965             BSA:          2.470 m Patient Age:    57 years              BP:           129/71 mmHg Patient Gender: M                     HR:           84 bpm. Exam Location:  [facility] Procedure: 2D Echo, Cardiac Doppler, Color Doppler and Intracardiac            Opacification Agent (Both Spectral and Color Flow Doppler were            utilized during procedure). Indications:     NSTEMI  History:         Patient has prior history of Echocardiogram examinations, most                  recent 06/24/2022. Acute MI and Previous  Myocardial Infarction;                  Risk Factors:Hypertension, Diabetes and Sleep Apnea. Infulenza                  +.  Sonographer:     Mikki Harbor Referring Phys:  [id number] JAN A MANSY Diagnosing Phys: Alwyn Pea MD  Sonographer Comments: Technically difficult study due to poor echo windows and patient is obese. IMPRESSIONS  1. Left ventricular ejection fraction, by estimation, is 65 to 70%. The  left ventricle has normal function. The left ventricle has no regional wall motion abnormalities. There is moderate concentric left ventricular hypertrophy. Left ventricular diastolic parameters are consistent with Grade II diastolic dysfunction (pseudonormalization).  2. Right ventricular systolic function is normal. The right ventricular size is normal.  3. The mitral valve is normal in structure. Trivial mitral valve regurgitation.  4. The aortic valve is normal in structure. Aortic valve regurgitation is not visualized. FINDINGS  Left Ventricle: Left ventricular ejection fraction, by estimation, is 65 to 70%. The left ventricle has normal function. The left ventricle has no regional wall motion abnormalities. Definity contrast agent was given IV to delineate the left ventricular  endocardial borders. Strain imaging was not performed. The left ventricular internal cavity size was normal in size. There is moderate concentric left ventricular hypertrophy. Left ventricular diastolic parameters are consistent with Grade II diastolic dysfunction (pseudonormalization). Right Ventricle: The right ventricular size is normal. No increase in right ventricular wall thickness. Right ventricular systolic function is normal. Left Atrium: Left atrial size was normal in size. Right Atrium: Right atrial size was normal in size. Pericardium: There is no evidence of pericardial effusion. Mitral Valve: The mitral valve is normal in structure. There is moderate thickening of the mitral valve leaflet(s). There is moderate  calcification of the mitral valve leaflet(s). Normal mobility of the mitral valve leaflets. Trivial mitral valve regurgitation. MV peak gradient, 8.3 mmHg. The mean mitral valve gradient is 4.0 mmHg. Tricuspid Valve: The tricuspid valve is normal in structure. Tricuspid valve regurgitation is trivial. Aortic Valve: The aortic valve is normal in structure. Aortic valve regurgitation is not visualized. Aortic valve mean gradient measures 6.0 mmHg. Aortic valve peak gradient measures 13.0 mmHg. Aortic valve area, by VTI measures 2.53 cm. Pulmonic Valve: The pulmonic valve was normal in structure. Pulmonic valve regurgitation is not visualized. Aorta: The ascending aorta was not well visualized. IAS/Shunts: No atrial level shunt detected by color flow Doppler. Additional Comments: 3D imaging was not performed.  LEFT VENTRICLE PLAX 2D LVIDd:         4.90 cm   Diastology LVIDs:         2.90 cm   LV e' medial:    5.77 cm/s LV PW:         1.50 cm   LV E/e' medial:  22.9 LV IVS:        1.40 cm   LV e' lateral:   5.98 cm/s LVOT diam:     2.10 cm   LV E/e' lateral: 22.1 LV SV:         82 LV SV Index:   33 LVOT Area:     3.46 cm  LEFT ATRIUM              Index LA diam:        4.30 cm  1.74 cm/m LA Vol (A2C):   109.0 ml 44.13 ml/m LA Vol (A4C):   91.6 ml  37.09 ml/m LA Biplane Vol: 102.0 ml 41.30 ml/m  AORTIC VALVE                     PULMONIC VALVE AV Area (Vmax):    2.16 cm      PV Vmax:       1.01 m/s AV Area (Vmean):   2.39 cm      PV Peak grad:  4.1 mmHg AV Area (VTI):     2.53 cm AV Vmax:  180.00 cm/s AV Vmean:          113.000 cm/s AV VTI:            0.326 m AV Peak Grad:      13.0 mmHg AV Mean Grad:      6.0 mmHg LVOT Vmax:         112.00 cm/s LVOT Vmean:        78.100 cm/s LVOT VTI:          0.238 m LVOT/AV VTI ratio: 0.73  AORTA Ao Root diam: 4.10 cm MITRAL VALVE MV Area (PHT): 3.50 cm     SHUNTS MV Area VTI:   2.37 cm     Systemic VTI:  0.24 m MV Peak grad:  8.3 mmHg     Systemic Diam: 2.10 cm MV  Mean grad:  4.0 mmHg MV Vmax:       1.44 m/s MV Vmean:      94.0 cm/s MV Decel Time: 217 msec MV E velocity: 132.00 cm/s MV A velocity: 132.00 cm/s MV E/A ratio:  1.00 Alwyn Pea MD Electronically signed by Alwyn Pea MD Signature Date/Time: 08/27/2023/7:55:21 PM    Final    DG Hand Complete Left Result Date: 08/26/2023 CLINICAL DATA:  Left hand swelling. EXAM: LEFT HAND - COMPLETE 3+ VIEW COMPARISON:  None Available. FINDINGS: No fracture or bone lesion. Joints are normally spaced and aligned. Soft tissue swelling, most evident over the dorsal aspect of the hand and wrist. No soft tissue air. No radiopaque foreign body. IMPRESSION: 1. No fracture or acute finding. 2. Nonspecific soft tissue swelling. Electronically Signed   By: Amie Portland M.D.   On: 08/26/2023 15:26   CT ABDOMEN PELVIS W CONTRAST Result Date: 08/25/2023 CLINICAL DATA:  Fever, nausea/vomiting/diarrhea, abdominal pain, polyuria EXAM: CT ABDOMEN AND PELVIS WITH CONTRAST TECHNIQUE: Multidetector CT imaging of the abdomen and pelvis was performed using the standard protocol following bolus administration of intravenous contrast. RADIATION DOSE REDUCTION: This exam was performed according to the departmental dose-optimization program which includes automated exposure control, adjustment of the mA and/or kV according to patient size and/or use of iterative reconstruction technique. CONTRAST:  OMNIPAQUE IOHEXOL 350 MG/ML SOLN COMPARISON:  04/03/2012 FINDINGS: Lower chest: There is minimal subpleural right lower lobe airspace disease, which could reflect focal pneumonia. No effusion. Hepatobiliary: No focal liver abnormality is seen. No gallstones, gallbladder wall thickening, or biliary dilatation. Pancreas: Unremarkable. No pancreatic ductal dilatation or surrounding inflammatory changes. Spleen: Normal in size without focal abnormality. Adrenals/Urinary Tract: The kidneys enhance normally and symmetrically. No urinary tract  calculi or obstructive uropathy within either kidney. Bladder is minimally distended, with no filling defects. The adrenals are normal. Stomach/Bowel: No bowel obstruction or ileus. Normal appendix right lower quadrant. Scattered diverticulosis of the sigmoid colon without diverticulitis. No bowel wall thickening or inflammatory change. Vascular/Lymphatic: Aortic atherosclerosis. No enlarged abdominal or pelvic lymph nodes. Reproductive: Prostate is unremarkable. Other: No free fluid or free intraperitoneal gas. No abdominal wall hernia. Musculoskeletal: No acute or destructive bony abnormalities. Bilateral hip osteoarthritis, left greater than right. Reconstructed images demonstrate no additional findings. IMPRESSION: 1. Patchy subpleural right lower lobe airspace disease, consistent with focal bronchopneumonia. 2. No acute intra-abdominal or intrapelvic process. 3.  Aortic Atherosclerosis (ICD10-I70.0). Electronically Signed   By: Sharlet Salina M.D.   On: 08/25/2023 23:08   DG Chest Port 1 View Result Date: 08/25/2023 CLINICAL DATA:  Sepsis, abdominal pain, polyuria, diabetes EXAM: PORTABLE CHEST 1 VIEW COMPARISON:  06/23/2022 FINDINGS: Single frontal view of the chest demonstrates an enlarged cardiac silhouette. Lung volumes are diminished, with crowding of the central vasculature. No effusion or pneumothorax. No acute bony abnormalities. IMPRESSION: 1. Low lung volumes, with crowding of the pulmonary vasculature. 2. Enlarged cardiac silhouette. Electronically Signed   By: Sharlet Salina M.D.   On: 08/25/2023 22:45   CT Head Wo Contrast Result Date: 08/25/2023 CLINICAL DATA:  Fever. Mental status change of unknown cause. Diabetic. EXAM: CT HEAD WITHOUT CONTRAST TECHNIQUE: Contiguous axial images were obtained from the base of the skull through the vertex without intravenous contrast. RADIATION DOSE REDUCTION: This exam was performed according to the departmental dose-optimization program which includes  automated exposure control, adjustment of the mA and/or kV according to patient size and/or use of iterative reconstruction technique. COMPARISON:  None Available. FINDINGS: Brain: The study suffers from motion degradation. With repeat imaging, a satisfactory exam is achieved. No sign of old or acute focal infarction, mass lesion, hemorrhage, hydrocephalus or extra-axial collection. Vascular: There is atherosclerotic calcification of the major vessels at the base of the brain. Skull: Negative Sinuses/Orbits: Apparent soft tissue swelling of the right posterior parietal scalp, presumed hematoma. Orbits negative. Other: None IMPRESSION: No acute intracranial finding. Apparent soft tissue swelling of the right posterior parietal scalp, presumed hematoma. Electronically Signed   By: Paulina Fusi M.D.   On: 08/25/2023 22:39    ECHO as above  TELEMETRY reviewed by me 08/28/2023: sinus rhythm rate 70s  EKG reviewed by me: sinus tachycardia rate 101 bpm  Data reviewed by me 08/28/2023: last 24h vitals tele labs imaging I/O hospitalist progress note  Principal Problem:   NSTEMI (non-ST elevated myocardial infarction) (HCC) Active Problems:   Sepsis due to pneumonia (HCC)   Hypokalemia   Metabolic acidosis   Type 2 diabetes mellitus with peripheral neuropathy (HCC)   Essential hypertension   BPH (benign prostatic hyperplasia)   Influenza A   Hypocalcemia   Septicemia due to group D Streptococcus (HCC)   Bacteremia due to group B Streptococcus   Sepsis due to group B Streptococcus (HCC)    ASSESSMENT AND PLAN:  Connor Ross is a 58 y.o. male  with a past medical history of non-STEMI coronary disease (s/p previous stents to prox and mid LAD in remote past, s/p PCI and stent to distal RCA and overlapping prox RCA 06/2022) diabetes, hypertension, obesity, hyperlipidemia who presented to the ED on 08/25/2023 for fever, vomiting, diarrhea. Troponins checked and found to be elevated. Cardiology was  consulted for further evaluation.   # NSTEMI # Coronary artery disease # Hypertension Patient with history of coronary disease with prior stenting in 06/2022 found to have elevated troponins upon admission.  No reports of chest pain symptoms.  Troponins trended 89 > 272 > 576 > 607 > 1071 > 1226 > 817.  Echo this admission with normal EF, no wall motion abnormalities, moderate LVH, grade 2 diastolic dysfunction. -Continue heparin infusion.  Will plan to discontinue this tomorrow. -S/p ASA 325 mg in the ED.  Continue rosuvastatin 40 mg daily, aspirin 81 mg daily, Plavix 75 mg daily. -Continue losartan 100 mg daily, metoprolol titrate 50 mg twice daily. -No plan for further cardiac diagnostics at this time given strep bacteremia.  Can consider cath in future for evaluation after infection has been treated appropriately.  # Influenza A infection # Pneumonia # Strep agalactiae bacteremia Patient presented to the ED with fever/chills, vomiting, diarrhea.  Reports feeling poorly for roughly 1  week.  Flu a positive in the ED.  CTA abdomen concerning for right lower lobe pneumonia.  Blood culture positive for strep agalactiae -Further management per primary.   This patient's plan of care was discussed and created with Dr. Juliann Pares and he is in agreement.  Signed: Gale Journey, PA-C  08/28/2023, 10:33 AM [facility]

## 2023-08-28 NOTE — Plan of Care (Signed)
Problem: Activity: Goal: Ability to tolerate increased activity will improve Outcome: Progressing   Problem: Education: Goal: Knowledge of General Education information will improve Description: Including pain rating scale, medication(s)/side effects and non-pharmacologic comfort measures Outcome: Progressing   Problem: Activity: Goal: Risk for activity intolerance will decrease Outcome: Progressing   Problem: Nutrition: Goal: Adequate nutrition will be maintained Outcome: Progressing   Problem: Coping: Goal: Level of anxiety will decrease Outcome: Progressing

## 2023-08-29 ENCOUNTER — Inpatient Hospital Stay: Payer: Medicare HMO

## 2023-08-29 DIAGNOSIS — G9340 Encephalopathy, unspecified: Secondary | ICD-10-CM | POA: Diagnosis not present

## 2023-08-29 DIAGNOSIS — B954 Other streptococcus as the cause of diseases classified elsewhere: Secondary | ICD-10-CM | POA: Diagnosis not present

## 2023-08-29 DIAGNOSIS — R7881 Bacteremia: Secondary | ICD-10-CM | POA: Diagnosis not present

## 2023-08-29 DIAGNOSIS — M79642 Pain in left hand: Secondary | ICD-10-CM

## 2023-08-29 DIAGNOSIS — I214 Non-ST elevation (NSTEMI) myocardial infarction: Secondary | ICD-10-CM | POA: Diagnosis not present

## 2023-08-29 DIAGNOSIS — J101 Influenza due to other identified influenza virus with other respiratory manifestations: Secondary | ICD-10-CM | POA: Diagnosis not present

## 2023-08-29 LAB — CBC
HCT: 38.4 % — ABNORMAL LOW (ref 39.0–52.0)
Hemoglobin: 13.1 g/dL (ref 13.0–17.0)
MCH: 30.4 pg (ref 26.0–34.0)
MCHC: 34.1 g/dL (ref 30.0–36.0)
MCV: 89.1 fL (ref 80.0–100.0)
Platelets: 176 10*3/uL (ref 150–400)
RBC: 4.31 MIL/uL (ref 4.22–5.81)
RDW: 13.2 % (ref 11.5–15.5)
WBC: 10 10*3/uL (ref 4.0–10.5)
nRBC: 0 % (ref 0.0–0.2)

## 2023-08-29 LAB — BASIC METABOLIC PANEL
Anion gap: 15 (ref 5–15)
BUN: 18 mg/dL (ref 6–20)
CO2: 20 mmol/L — ABNORMAL LOW (ref 22–32)
Calcium: 8.5 mg/dL — ABNORMAL LOW (ref 8.9–10.3)
Chloride: 100 mmol/L (ref 98–111)
Creatinine, Ser: 0.62 mg/dL (ref 0.61–1.24)
GFR, Estimated: >60 mL/min (ref >60)
Glucose, Bld: 137 mg/dL — ABNORMAL HIGH (ref 70–99)
Potassium: 3.5 mmol/L (ref 3.5–5.1)
Sodium: 135 mmol/L (ref 135–145)

## 2023-08-29 LAB — HEPARIN LEVEL (UNFRACTIONATED)
Heparin Unfractionated: 0.51 [IU]/mL (ref 0.30–0.70)
Heparin Unfractionated: 0.55 [IU]/mL (ref 0.30–0.70)

## 2023-08-29 LAB — PHOSPHORUS: Phosphorus: 4.3 mg/dL (ref 2.5–4.6)

## 2023-08-29 LAB — MAGNESIUM: Magnesium: 2.3 mg/dL (ref 1.7–2.4)

## 2023-08-29 MED ORDER — POTASSIUM CHLORIDE CRYS ER 20 MEQ PO TBCR
40.0000 meq | EXTENDED_RELEASE_TABLET | Freq: Once | ORAL | Status: AC
  Administered 2023-08-29: 40 meq via ORAL
  Filled 2023-08-29: qty 4

## 2023-08-29 MED ORDER — GADOBUTROL 1 MMOL/ML IV SOLN
10.0000 mL | Freq: Once | INTRAVENOUS | Status: AC | PRN
  Administered 2023-08-29: 10 mL via INTRAVENOUS

## 2023-08-29 NOTE — TOC Progression Note (Signed)
Transition of Care [facility]) - Progression Note    Patient Details  Name: Connor Ross MRN: [id number] Date of Birth: 09/11/1965  Transition of [facility] CM/SW Contact  Hetty Ely, RN Phone Number: 08/29/2023, 4:18 PM  Clinical Narrative:  Will not need SNF per PT, will need HH and RW. Not medically stable for discharge at this time. Pending ID and Card.          Expected Discharge Plan and Services                                               Social Determinants of Health (SDOH) Interventions SDOH Screenings   Food Insecurity: Patient Declined (08/27/2023)  Housing: Patient Declined (08/27/2023)  Transportation Needs: Patient Declined (08/27/2023)  Utilities: Patient Declined (08/27/2023)  Alcohol Screen: Low Risk  (06/30/2023)  Depression (PHQ2-9): Low Risk  (07/04/2023)  Financial Resource Strain: Medium Risk (06/30/2023)  Physical Activity: Insufficiently Active (06/30/2023)  Social Connections: Moderately Integrated (06/30/2023)  Stress: No Stress Concern Present (06/30/2023)  Tobacco Use: Low Risk  (08/25/2023)    Readmission Risk Interventions     No data to display

## 2023-08-29 NOTE — Progress Notes (Signed)
Date of Admission:  08/25/2023      ID: Connor Ross is a 58 y.o. male  Principal Problem:   NSTEMI (non-ST elevated myocardial infarction) (HCC) Active Problems:   Sepsis due to pneumonia (HCC)   Hypokalemia   Metabolic acidosis   Type 2 diabetes mellitus with peripheral neuropathy (HCC)   Essential hypertension   BPH (benign prostatic hyperplasia)   Influenza A   Hypocalcemia   Septicemia due to group D Streptococcus (HCC)   Bacteremia due to group B Streptococcus   Sepsis due to group B Streptococcus (HCC)   Encephalopathy    Subjective: Pt is feeling better But still has menory gaps Daughter says he was not like this before  Left hand pain and swelling and eryhthema better  Medications:   aspirin EC  81 mg Oral Daily   calcium-vitamin D  2 tablet Oral TID   clopidogrel  75 mg Oral Daily   empagliflozin  25 mg Oral Daily   furosemide  20 mg Oral QODAY   gabapentin  100 mg Oral TID   guaiFENesin  600 mg Oral BID   losartan  100 mg Oral Daily   metoprolol tartrate  50 mg Oral BID   multivitamin with minerals  1 tablet Oral Daily   oseltamivir  75 mg Oral BID   pantoprazole  40 mg Oral Daily   predniSONE  40 mg Oral Q breakfast   rosuvastatin  40 mg Oral QPM   tamsulosin  0.4 mg Oral Daily   Vitamin D (Ergocalciferol)  50,000 Units Oral Weekly    Objective: Vital signs in last 24 hours: Patient Vitals for the past 24 hrs:  BP Temp Temp src Pulse Resp SpO2  08/29/23 1219 (!) 155/90 98.1 F (36.7 C) -- 81 20 95 %  08/29/23 0358 129/68 98.1 F (36.7 C) Oral 67 18 93 %  08/28/23 2347 130/73 99.2 F (37.3 C) Oral 81 18 94 %  08/28/23 2011 (!) 171/83 98.4 F (36.9 C) Oral 96 20 96 %  08/28/23 1825 (!) 150/80 98.4 F (36.9 C) Oral 82 -- 98 %      PHYSICAL EXAM:  General: Alert, cooperative, no distress, appears stated age. Still has some memory deficit Head: Normocephalic, without obvious abnormality, atraumatic. Eyes: Conjunctivae clear,  anicteric sclerae. Pupils are equal ENT Nares normal. No drainage or sinus tenderness. Lips, mucosa, and tongue normal. No Thrush Neck: Supple, symmetrical, no adenopathy, thyroid: non tender no carotid bruit and no JVD. Back: No CVA tenderness. Lungs: Clear to auscultation bilaterally. No Wheezing or Rhonchi. No rales. Heart: Regular rate and rhythm, no murmur, rub or gallop. Abdomen: Soft, non-tender,not distended. Bowel sounds normal. No masses Extremities:left hand dorsum swollen , erythematous  08/29/23   08/27/23  Skin: No rashes or lesions. Or bruising Lymph: Cervical, supraclavicular normal. Neurologic: Grossly non-focal  Lab Results    Latest Ref Rng & Units 08/29/2023    6:32 AM 08/28/2023    2:16 AM 08/27/2023    2:51 AM  CBC  WBC 4.0 - 10.5 K/uL 10.0  10.1  10.6   Hemoglobin 13.0 - 17.0 g/dL 19.1  47.8  29.5   Hematocrit 39.0 - 52.0 % 38.4  37.2  39.2   Platelets 150 - 400 K/uL 176  138  143        Latest Ref Rng & Units 08/29/2023    6:32 AM 08/28/2023    2:16 AM 08/27/2023    2:09 PM  CMP  Glucose 70 - 99 mg/dL 119  147  829   BUN 6 - 20 mg/dL 18  13  13    Creatinine 0.61 - 1.24 mg/dL 5.62  1.30  8.65   Sodium 135 - 145 mmol/L 135  133  133   Potassium 3.5 - 5.1 mmol/L 3.5  3.3  3.0   Chloride 98 - 111 mmol/L 100  97  97   CO2 22 - 32 mmol/L 20  23  25    Calcium 8.9 - 10.3 mg/dL 8.5  8.7  8.1       Microbiology: 08/25/23 4/4 GBS - but taken from the same site 08/27/23 [facility] - NG  Studies/Results: ECHOCARDIOGRAM COMPLETE Result Date: 08/27/2023    ECHOCARDIOGRAM REPORT   Patient Name:   Connor Ross [facility] Date of Exam: 08/27/2023 Medical Rec #:  [id number]             Height:       71.0 in Accession #:    [id number]            Weight:       290.2 lb Date of Birth:  03/21/1966             BSA:          2.470 m Patient Age:    57 years              BP:           129/71 mmHg Patient Gender: M                     HR:           84 bpm. Exam Location:  [facility]  Procedure: 2D Echo, Cardiac Doppler, Color Doppler and Intracardiac            Opacification Agent (Both Spectral and Color Flow Doppler were            utilized during procedure). Indications:     NSTEMI  History:         Patient has prior history of Echocardiogram examinations, most                  recent 06/24/2022. Acute MI and Previous Myocardial Infarction;                  Risk Factors:Hypertension, Diabetes and Sleep Apnea. Infulenza                  +.  Sonographer:     Mikki Harbor Referring Phys:  [id number] JAN A MANSY Diagnosing Phys: Alwyn Pea MD  Sonographer Comments: Technically difficult study due to poor echo windows and patient is obese. IMPRESSIONS  1. Left ventricular ejection fraction, by estimation, is 65 to 70%. The left ventricle has normal function. The left ventricle has no regional wall motion abnormalities. There is moderate concentric left ventricular hypertrophy. Left ventricular diastolic parameters are consistent with Grade II diastolic dysfunction (pseudonormalization).  2. Right ventricular systolic function is normal. The right ventricular size is normal.  3. The mitral valve is normal in structure. Trivial mitral valve regurgitation.  4. The aortic valve is normal in structure. Aortic valve regurgitation is not visualized. FINDINGS  Left Ventricle: Left ventricular ejection fraction, by estimation, is 65 to 70%. The left ventricle has normal function. The left ventricle has no regional wall motion abnormalities. Definity contrast agent was given IV to delineate the left ventricular  endocardial borders. Strain imaging was  not performed. The left ventricular internal cavity size was normal in size. There is moderate concentric left ventricular hypertrophy. Left ventricular diastolic parameters are consistent with Grade II diastolic dysfunction (pseudonormalization). Right Ventricle: The right ventricular size is normal. No increase in right ventricular wall thickness.  Right ventricular systolic function is normal. Left Atrium: Left atrial size was normal in size. Right Atrium: Right atrial size was normal in size. Pericardium: There is no evidence of pericardial effusion. Mitral Valve: The mitral valve is normal in structure. There is moderate thickening of the mitral valve leaflet(s). There is moderate calcification of the mitral valve leaflet(s). Normal mobility of the mitral valve leaflets. Trivial mitral valve regurgitation. MV peak gradient, 8.3 mmHg. The mean mitral valve gradient is 4.0 mmHg. Tricuspid Valve: The tricuspid valve is normal in structure. Tricuspid valve regurgitation is trivial. Aortic Valve: The aortic valve is normal in structure. Aortic valve regurgitation is not visualized. Aortic valve mean gradient measures 6.0 mmHg. Aortic valve peak gradient measures 13.0 mmHg. Aortic valve area, by VTI measures 2.53 cm. Pulmonic Valve: The pulmonic valve was normal in structure. Pulmonic valve regurgitation is not visualized. Aorta: The ascending aorta was not well visualized. IAS/Shunts: No atrial level shunt detected by color flow Doppler. Additional Comments: 3D imaging was not performed.  LEFT VENTRICLE PLAX 2D LVIDd:         4.90 cm   Diastology LVIDs:         2.90 cm   LV e' medial:    5.77 cm/s LV PW:         1.50 cm   LV E/e' medial:  22.9 LV IVS:        1.40 cm   LV e' lateral:   5.98 cm/s LVOT diam:     2.10 cm   LV E/e' lateral: 22.1 LV SV:         82 LV SV Index:   33 LVOT Area:     3.46 cm  LEFT ATRIUM              Index LA diam:        4.30 cm  1.74 cm/m LA Vol (A2C):   109.0 ml 44.13 ml/m LA Vol (A4C):   91.6 ml  37.09 ml/m LA Biplane Vol: 102.0 ml 41.30 ml/m  AORTIC VALVE                     PULMONIC VALVE AV Area (Vmax):    2.16 cm      PV Vmax:       1.01 m/s AV Area (Vmean):   2.39 cm      PV Peak grad:  4.1 mmHg AV Area (VTI):     2.53 cm AV Vmax:           180.00 cm/s AV Vmean:          113.000 cm/s AV VTI:            0.326 m AV Peak  Grad:      13.0 mmHg AV Mean Grad:      6.0 mmHg LVOT Vmax:         112.00 cm/s LVOT Vmean:        78.100 cm/s LVOT VTI:          0.238 m LVOT/AV VTI ratio: 0.73  AORTA Ao Root diam: 4.10 cm MITRAL VALVE MV Area (PHT): 3.50 cm     SHUNTS MV Area VTI:   2.37 cm  Systemic VTI:  0.24 m MV Peak grad:  8.3 mmHg     Systemic Diam: 2.10 cm MV Mean grad:  4.0 mmHg MV Vmax:       1.44 m/s MV Vmean:      94.0 cm/s MV Decel Time: 217 msec MV E velocity: 132.00 cm/s MV A velocity: 132.00 cm/s MV E/A ratio:  1.00 Dwayne D Callwood MD Electronically signed by Alwyn Pea MD Signature Date/Time: 08/27/2023/7:55:21 PM    Final    Micro'  Assessment/Plan:   58 year old male presenting with confusion, vomiting and diarrhea and pain left hand and fever   Influenza a viral illness on Tamiflu   Streptococcus group G bacteremia Likely source is the left hand cellulitis The source of the cellulitis is likely from the fingersticks he does for diabetic checkup on ceftriaxone day 4  Repeat BC neg Left hand cellulitis. Much improved After 5 days of IV ceftriaxone  ( tomorrow is day 5)  we can switch to Po amoxicillin 1 gram TID for 5 more days  2d echo shows mitral valve leaflet thickening GBS is nto a common bacteria to cause endocarditis No TEE needed   Encephalopathy - improving ? Due to flu Some memory gaps persist - ? MRI brain with contrast Will Dc tamiflu just in case it is causing it   Diabetes mellitus   NSTEMI on admission Increasing troponin this admission.  Seen by cardiology.was on Iv heparin- now DC CAD status post stents   Discussed the management with the patient , daughter and with Dr.Kumar  ID will not routinely see him tomorrow- call if needed

## 2023-08-29 NOTE — Progress Notes (Signed)
2/14 Patient under Droplet and Contact Precaution, IMM Letter was given to the Nurse Tech assigned to patient to give to the patient at bedside.

## 2023-08-29 NOTE — Progress Notes (Signed)
Progress Note   Patient: Connor Ross:[id number] DOB: [birth year] DOA: 08/25/2023     3 DOS: the patient was seen and examined on 08/29/2023   Brief hospital course: Connor Ross is a 58 y.o. male with medical history significant for type diabetes mellitus, hypertension and dyslipidemia, who presented to the emergency room with acute onset of fever with altered mental status  Patient was found to have tachycardia, tachypnea.  No leukocytosis.  Influenza was positive, blood culture was positive in 1 bottle with Streptococcus.  She was placed on Rocephin.  Patient also had elevated troponin peaked at 1226, seen by cardiology and placed on heparin drip.   Principal Problem:   NSTEMI (non-ST elevated myocardial infarction) (HCC) Active Problems:   Sepsis due to pneumonia (HCC)   Hypokalemia   Metabolic acidosis   Type 2 diabetes mellitus with peripheral neuropathy (HCC)   Essential hypertension   BPH (benign prostatic hyperplasia)   Influenza A   Hypocalcemia   Septicemia due to group D Streptococcus (HCC)   Bacteremia due to group B Streptococcus   Sepsis due to group B Streptococcus (HCC)   Encephalopathy   Assessment and Plan:  # NSTEMI (non-ST elevated myocardial infarction) (HCC) Elevated troponin in the setting of influenza and septicemia.   Troponin peaked 1226, trended down, most likely has non-STEMI.  Cardiology consulted, cont Hep gtt for now, will need cath after improvement, can not do it due to bacteremia 2/14 discontinued heparin IV infusion after 48 hours  # Acute hypoxic respiratory failure secondary to pneumonia, resolved # Sepsis due to pneumonia  # Influenza A + # Bacteremia, GB Strep agalactiae   # Left hand cellulitis. Patient did meet sepsis criteria at time of admission with tachycardia and tachypnea.   Chest x-ray and CT scan also showed left lower lobe airspace disease consistent with pneumonia. Patient blood culture positive for  Streptococcus agalactia.  The urine culture performed on 09/2022 also positive for Streptococcus agalactia.  Patient also has left hand swelling and red, states that he might had a spider bite. x-ray negative for Fx. He has multiple reasons for bacteremia, but exact etiology is unclear.  No need of TEE as per ID   ID consulted. Continue Rocephin for now. Patient is symptom of the flu started last Wednesday, out of the window for Tamiflu. 2/12 repeat Bld Cx NGTD 2/13 started pantoprazole 40 mg p.o. daily  # Hypokalemia, K repleted  # Hypophosphatemia, Phos repleted # Metabolic acidosis. Resolved, s/p bicarb drip. Monitored electrolytes daily  # BPH (benign prostatic hyperplasia) - resumed Flomax.  # Essential hypertension - continue antihypertensive therapy.  # Type 2 diabetes mellitus with peripheral neuropathy - The patient will be placed on supplemental coverage with NovoLog. - continue basal coverage. - continue Neurontin.  # Morbid obesity with BMI 40.47.   Diet and excise advised.  # Debility. Patient was severely debilitated at home, largely due to joint arthritis.  Obtain PT/OT.   Subjective:  No significant overnight events, breathing improved, patient is saturating well on room air.  Having mild cough, no significant phlegm production.  No significant diarrhea. Left hand swelling is getting better.  Physical Exam: Vitals:   08/28/23 2011 08/28/23 2347 08/29/23 0358 08/29/23 1219  BP: (!) 171/83 130/73 129/68 (!) 155/90  Pulse: 96 81 67 81  Resp: 20 18 18 20   Temp: 98.4 F (36.9 C) 99.2 F (37.3 C) 98.1 F (36.7 C) 98.1 F (36.7 C)  TempSrc: Oral Oral Oral  SpO2: 96% 94% 93% 95%  Height:       General exam: Appears calm and comfortable, morbid obese. Respiratory system: Mild b/l crackles, no wheezes, no resp distress, normal work of breathing  Cardiovascular system: S1 & S2 heard, RRR. No JVD, murmurs, rubs, gallops or clicks. No pedal  edema. Gastrointestinal system: Abdomen is nondistended, soft and nontender. No organomegaly or masses felt. Normal bowel sounds heard. Central nervous system: Alert and oriented. No focal neurological deficits. Extremities: Left Hand redness swelling, gradually improving Skin: No rashes, lesions or ulcers Psychiatry: Judgement and insight appear normal. Mood & affect appropriate.    Data Reviewed:  Reviewed CT scan results, chest x-ray results and CT head results.  Family Communication: Wife updated at bedside.  Disposition: Status is: Inpatient Remains inpatient appropriate because: Severity of disease, IV treatment.     Time spent: 45 min  Author: Gillis Santa, MD 08/29/2023 4:11 PM  For on call review [email or web address].

## 2023-08-29 NOTE — Progress Notes (Signed)
Occupational Therapy Treatment Patient Details Name: Connor Ross MRN: [id number] DOB: 04-27-1966 Today's Date: 08/29/2023   History of present illness Pt is a 58 y.o. male presenting to hospital 08/25/23 with c/o fever and AMS; also N/V/D.  PMH includes DM, htn, diabetic ulcer L toe, lumbar disc surgery.  Pt admitted with NSTEMI, sepsis d/t PNA, (+) influenza A, hypokalemia, metabolic acidosis, L hand cellulitis.   OT comments  Pt is seated EOB with PT on arrival. Pleasant, still mildly confused and STM deficits noted. He is agreeable to PT/OT co treatment session to maximize safety and progress mobility safely. No reports of pain during session. STS from EOB and recliner with CGA x1 to RW. Able to stand to perform peri-care with unilateral support on RW with CGA. CGA x1-2 for SPT to recliner using RW. Seated RB to change socks and gown from urination-Max A for LB dressing and min A for UB dressing to manage IV and tele box. He ambulated out into the hallway using RW with CGA x1 and SBA x1 for safety and management of lines/leads. HR 85-90 and sp02 94% after activity on RA.  Pt left seated in reclienr with all needs in place and will cont to require skilled acute OT services to maximize his safety and IND to return to PLOF.       If plan is discharge home, recommend the following:  Assistance with cooking/housework;Assist for transportation;Direct supervision/assist for financial management;Supervision due to cognitive status;Direct supervision/assist for medications management;Help with stairs or ramp for entrance;A little help with bathing/dressing/bathroom;A little help with walking and/or transfers   Equipment Recommendations   (RW)    Recommendations for Other Services      Precautions / Restrictions Precautions Precautions: Fall Recall of Precautions/Restrictions: Impaired Restrictions Weight Bearing Restrictions Per Provider Order: No       Mobility Bed Mobility                General bed mobility comments: NT seated EOB with PT on entry    Transfers Overall transfer level: Needs assistance Equipment used: Rolling walker (2 wheels) Transfers: Sit to/from Stand, Bed to chair/wheelchair/BSC Sit to Stand: Contact guard assist     Step pivot transfers: Contact guard assist     General transfer comment: STS from EOB and recliner with CGA and use of RW; CGA x2 for SPT to recliner, no LOB cueing for safety/hand placement on RW     Balance Overall balance assessment: Needs assistance Sitting-balance support: No upper extremity supported, Feet supported Sitting balance-Leahy Scale: Good     Standing balance support: Single extremity supported Standing balance-Leahy Scale: Fair Standing balance comment: steady static standing and able to hold RW with unilateral support to clean peri-area                           ADL either performed or assessed with clinical judgement   ADL Overall ADL's : Needs assistance/impaired                 Upper Body Dressing : Minimal assistance;Sitting Upper Body Dressing Details (indicate cue type and reason): to doff/don gown Lower Body Dressing: Maximal assistance Lower Body Dressing Details (indicate cue type and reason): to doff/don socks seated in recliner Toilet Transfer: BSC/3in1;Stand-pivot;Contact guard assist Toilet Transfer Details (indicate cue type and reason): CGA x2 for SPT to recliner from EOB Toileting- Clothing Manipulation and Hygiene: Contact guard assist;Sit to/from stand Toileting - Architect Details (  indicate cue type and reason): washed peri-area with washcloth in standing at [facility] with [facility]            Extremity/Trunk Assessment              Vision       Perception     Praxis     Communication Communication Communication: No apparent difficulties   Cognition Arousal: Alert Behavior During Therapy: [facility] for tasks assessed/performed                                  Following commands: Impaired Following commands impaired: Only follows one step commands consistently, Follows one step commands with increased time      Cueing   Cueing Techniques: Verbal cues, Tactile cues  Exercises      Shoulder Instructions       General Comments HR 85-90 with activity; sp02 94% at lowest on RA; pt stood and urinated in the floor d/t purewick not on properly-increased time standing while getting this taken care of    Pertinent Vitals/ Pain       Pain Assessment Pain Assessment: No/denies pain Pain Intervention(s): Monitored during session, Limited activity within patient's tolerance  Home Living                                          Prior Functioning/Environment              Frequency  Min 1X/week        Progress Toward Goals  OT Goals(current goals can now be found in the care plan section)  Progress towards OT goals: Progressing toward goals  Acute Rehab OT Goals Patient Stated Goal: return home OT Goal Formulation: With patient Time For Goal Achievement: 09/11/23 Potential to Achieve Goals: Fair  Plan      Co-evaluation    PT/OT/SLP Co-Evaluation/Treatment: Yes Reason for Co-Treatment: Necessary to address cognition/behavior during functional activity;For patient/therapist safety;To address functional/ADL transfers PT goals addressed during session: Mobility/safety with mobility;Proper use of DME OT goals addressed during session: ADL's and self-care      AM-PAC OT "6 Clicks" Daily Activity     Outcome Measure   Help from another person eating meals?: None Help from another person taking care of personal grooming?: A Little Help from another person toileting, which includes using toliet, bedpan, or urinal?: A Little Help from another person bathing (including washing, rinsing, drying)?: A Little Help from another person to put on and taking off regular upper body clothing?: A  Little Help from another person to put on and taking off regular lower body clothing?: A Little 6 Click Score: 19    End of Session Equipment Utilized During Treatment: Gait belt;Rolling walker (2 wheels)  OT Visit Diagnosis: Other abnormalities of gait and mobility (R26.89);Unsteadiness on feet (R26.81);Muscle weakness (generalized) (M62.81)   Activity Tolerance Patient tolerated treatment well   Patient Left with call bell/phone within reach;in chair;with chair alarm set   Nurse Communication Mobility status        Time: 1324-4010 OT Time Calculation (min): 35 min  Charges: OT General Charges $OT Visit: 1 Visit OT Treatments $Self Care/Home Management : 8-22 mins  Larose Batres, OTR/L  08/29/23, 1:27 PM   Michaeljoseph Revolorio E Meriel Kelliher 08/29/2023, 1:23 PM

## 2023-08-29 NOTE — Progress Notes (Signed)
Patient ID: Connor Ross, male   DOB: 07/24/1965, 58 y.o.   MRN: [id number] [facility] Cardiology    SUBJECTIVE: Patient feeling somewhat better still slightly confused and delirious but improving no chest pain improving shortness of breath denies any significant fever chills or sweats   Vitals:   08/29/23 0358 08/29/23 1219 08/29/23 1628 08/29/23 2000  BP: 129/68 (!) 155/90 (!) 140/76 (!) 145/69  Pulse: 67 81 84 78  Resp: 18 20 19 18   Temp: 98.1 F (36.7 C) 98.1 F (36.7 C) 98.6 F (37 C) 98.4 F (36.9 C)  TempSrc: Oral  Oral Oral  SpO2: 93% 95% 93% 96%  Height:         Intake/Output Summary (Last 24 hours) at 08/29/2023 2235 Last data filed at 08/29/2023 2100 Gross per 24 hour  Intake 240 ml  Output 1950 ml  Net -1710 ml      PHYSICAL EXAM  General: Well developed, well nourished, in no acute distress HEENT:  Normocephalic and atramatic Neck:  No JVD.  Lungs: Clear bilaterally to auscultation and percussion. Heart: HRRR . Normal S1 and S2 without gallops or murmurs.  Abdomen: Bowel sounds are positive, abdomen soft and non-tender  Msk:  Back normal, normal gait. Normal strength and tone for age. Extremities: No clubbing, cyanosis or edema.   Neuro: Alert and oriented X 3. Psych:  Good affect, responds appropriately   LABS: Basic Metabolic Panel: Recent Labs    08/28/23 0216 08/29/23 0632  NA 133* 135  K 3.3* 3.5  CL 97* 100  CO2 23 20*  GLUCOSE 152* 137*  BUN 13 18  CREATININE 0.57* 0.62  CALCIUM 8.7* 8.5*  MG 2.1 2.3  PHOS 2.3* 4.3   Liver Function Tests: No results for input(s): "AST", "ALT", "ALKPHOS", "BILITOT", "PROT", "ALBUMIN" in the last 72 hours. No results for input(s): "LIPASE", "AMYLASE" in the last 72 hours. CBC: Recent Labs    08/28/23 0216 08/29/23 0632  WBC 10.1 10.0  HGB 12.8* 13.1  HCT 37.2* 38.4*  MCV 89.2 89.1  PLT 138* 176   Cardiac Enzymes: No results for input(s): "CKTOTAL", "CKMB", "CKMBINDEX", "TROPONINI" in the  last 72 hours. BNP: Invalid input(s): "POCBNP" D-Dimer: No results for input(s): "DDIMER" in the last 72 hours. Hemoglobin A1C: No results for input(s): "HGBA1C" in the last 72 hours. Fasting Lipid Panel: No results for input(s): "CHOL", "HDL", "LDLCALC", "TRIG", "CHOLHDL", "LDLDIRECT" in the last 72 hours. Thyroid Function Tests: No results for input(s): "TSH", "T4TOTAL", "T3FREE", "THYROIDAB" in the last 72 hours.  Invalid input(s): "FREET3" Anemia Panel: No results for input(s): "VITAMINB12", "FOLATE", "FERRITIN", "TIBC", "IRON", "RETICCTPCT" in the last 72 hours.  MR BRAIN W WO CONTRAST Result Date: 08/29/2023 CLINICAL DATA:  Mental status change, unknown cause EXAM: MRI HEAD WITHOUT AND WITH CONTRAST TECHNIQUE: Multiplanar, multiecho pulse sequences of the brain and surrounding structures were obtained without and with intravenous contrast. CONTRAST:  10mL GADAVIST GADOBUTROL 1 MMOL/ML IV SOLN COMPARISON:  CT head August 25, 2023. 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 Mild associated edema without mass effect. No midline shift. No acute hemorrhage, mass lesion or hydrocephalus. Small remote lacunar infarcts in the thalami bilaterally. No pathologic enhancement. Vascular: Major arterial flow voids are maintained at the skull base. Skull and upper cervical spine: Normal marrow signal. Sinuses/Orbits: Clear sinuses.  No acute orbital findings. Other: No mastoid effusions. 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 Given involvement of multiple vascular territories, consider an embolic etiology. No mass effect. These results will be called to the ordering clinician or representative by the Radiologist Assistant, and communication documented in the PACS or [employer]. Electronically Signed   By: Feliberto Harts M.D.   On: 08/29/2023 21:36      Echo preserved left ventricular function EF of at least 60% no evidence of valvular endocarditis  TELEMETRY: Normal sinus rhythm rate of 75 nonspecific ST-T changes:  ASSESSMENT AND PLAN:  Principal Problem:   NSTEMI (non-ST elevated myocardial infarction) (HCC) Active Problems:   Sepsis due to pneumonia (HCC)   Hypokalemia   Metabolic acidosis   Type 2 diabetes mellitus with peripheral neuropathy (HCC)   Essential hypertension   BPH (benign prostatic hyperplasia)   Influenza A   Hypocalcemia   Septicemia due to group D Streptococcus (HCC)   Bacteremia due to group B Streptococcus   Sepsis due to group B Streptococcus (HCC)   Encephalopathy    Plan Non-STEMI elevated troponins peaked at 1200 recommend conservative therapy anticoagulation with heparin for 48 to 72 hours Recommend aspirin Plavix for 6 months Do not necessarily recommend cardiac cath at this stage with his influenza A infection and strep bacteremia Hypertension reasonably controlled continue current medical management losartan metoprolol Hyperlipidemia maintain Crestor therapy for lipid management Influenza A infection continue current therapy supportive care Pneumonia influenza A as well as possible strep pneumonia continue current management broad-spectrum antibiotic therapy supportive care Subjective bacteremia fever chills vomiting but also flu positive in the ER possible right lower lobe pneumonia continue antibiotic therapy  Alwyn Pea, MD,  08/29/2023 10:35 PM

## 2023-08-29 NOTE — Plan of Care (Signed)
  Problem: Education: Goal: Understanding of cardiac disease, CV risk reduction, and recovery process will improve Outcome: Progressing Goal: Individualized Educational Video(s) Outcome: Progressing   Problem: Activity: Goal: Ability to tolerate increased activity will improve Outcome: Progressing   Problem: Cardiac: Goal: Ability to achieve and maintain adequate cardiovascular perfusion will improve Outcome: Progressing   Problem: Activity: Goal: Risk for activity intolerance will decrease Outcome: Progressing   Problem: Elimination: Goal: Will not experience complications related to urinary retention Outcome: Progressing   Problem: Safety: Goal: Ability to remain free from injury will improve Outcome: Progressing

## 2023-08-29 NOTE — Care Management Important Message (Signed)
Important Message  Patient Details  Name: KYRIN GRATZ MRN: [id number] Date of Birth: 12/14/1965   Important Message Given:  Yes - Medicare IM     Sherilyn Banker 08/29/2023, 11:07 AM

## 2023-08-29 NOTE — Progress Notes (Signed)
 Physical Therapy Treatment Patient Details Name: Connor Ross MRN: [id number] DOB: [birth year] Today's Date: 08/29/2023   History of Present Illness Pt is a 58 y.o. male presenting to hospital 08/25/23 with c/o fever and AMS; also N/V/D.  PMH includes DM, htn, diabetic ulcer L toe, lumbar disc surgery.  Pt admitted with NSTEMI, sepsis d/t PNA, (+) influenza A, hypokalemia, metabolic acidosis, L hand cellulitis.    PT Comments  Pt resting in bed upon PT arrival; pt agreeable to therapy.  No c/o pain during session.  Pt SBA semi-supine to sitting EOB.  OT arrived for PT/OT co-treatment.  During session pt CGA with transfers using RW and CGA x1 plus SBA of 2nd for safety and IV pole management (pt on heparin drip) for ambulation 100 feet with RW use.  Pt oriented to person, hospital, general situation, and month/year (pt reported it was the 12th or 14th) but pt noted with confusion and forgetfulness during session  (pt also reporting having difficulty remembering things).  D/t pt's progress with functional mobility, therapy recommendations updated (TOC notified).  Will continue to focus on strengthening and progressive functional mobility during hospitalization.   If plan is discharge home, recommend the following: A little help with walking and/or transfers;A little help with bathing/dressing/bathroom;Assistance with cooking/housework;Assist for transportation;Help with stairs or ramp for entrance   Can travel by private vehicle      Yes  Equipment Recommendations  Rolling walker (2 wheels);BSC/3in1    Recommendations for Other Services       Precautions / Restrictions Precautions Precautions: Fall Recall of Precautions/Restrictions: Impaired Restrictions Weight Bearing Restrictions Per Provider Order: No     Mobility  Bed Mobility Overal bed mobility: Needs Assistance Bed Mobility: Supine to Sit     Supine to sit: Supervision, HOB elevated     General bed mobility  comments: increased effort to perform on own    Transfers Overall transfer level: Needs assistance Equipment used: Rolling walker (2 wheels) Transfers: Sit to/from Stand, Bed to chair/wheelchair/BSC Sit to Stand: Contact guard assist   Step pivot transfers: Contact guard assist       General transfer comment: CGA to stand from bed x1 trial and from recliner x1 trial (pt with B LE's partially crossed and externally rotated); CGA stand step turn bed to recliner with RW use; vc's for UE placement during transfers    Ambulation/Gait Ambulation/Gait assistance: Contact guard assist Gait Distance (Feet): 100 Feet Assistive device: Rolling walker (2 wheels) Gait Pattern/deviations: Step-through pattern, Decreased step length - right, Decreased step length - left Gait velocity: decreased     General Gait Details: increased L lateral sway (and L LE stance time) but no loss of balance noted   Stairs             Wheelchair Mobility     Tilt Bed    Modified Rankin (Stroke Patients Only)       Balance Overall balance assessment: Needs assistance Sitting-balance support: No upper extremity supported, Feet supported Sitting balance-Leahy Scale: Good Sitting balance - Comments: steady reaching within BOS   Standing balance support: No upper extremity supported Standing balance-Leahy Scale: Fair Standing balance comment: steady static standing                            Communication Communication Communication: No apparent difficulties  Cognition Arousal: Alert Behavior During Therapy: WFL for tasks assessed/performed   PT - Cognitive impairments: No family/caregiver present to  determine baseline, Awareness, Memory, Attention                       PT - Cognition Comments: A&O to person, place, general situation, and month/year (pt reported it was the 12th or 14th); pt demonstrating confusion and forgetfullness during session (pt reports having  difficulty remembering things) Following commands: Impaired Following commands impaired: Only follows one step commands consistently, Follows one step commands with increased time    Cueing Cueing Techniques: Verbal cues, Tactile cues  Exercises      General Comments General comments (skin integrity, edema, etc.): HR 85-90 bpm during session and SpO2 sats 94% on room air post ambulation.  Nursing cleared pt for participation in physical therapy.  Pt agreeable to therapy session.      Pertinent Vitals/Pain Pain Assessment Pain Assessment: No/denies pain Pain Intervention(s): Limited activity within patient's tolerance, Monitored during session    Home Living                          Prior Function            PT Goals (current goals can now be found in the care plan section) Acute Rehab PT Goals Patient Stated Goal: to improve strength and walking PT Goal Formulation: With patient Time For Goal Achievement: 09/11/23 Potential to Achieve Goals: Good Progress towards PT goals: Progressing toward goals    Frequency    Min 1X/week      PT Plan      Co-evaluation PT/OT/SLP Co-Evaluation/Treatment: Yes Reason for Co-Treatment: Necessary to address cognition/behavior during functional activity;For patient/therapist safety;To address functional/ADL transfers PT goals addressed during session: Mobility/safety with mobility;Proper use of DME OT goals addressed during session: ADL's and self-care      AM-PAC PT "6 Clicks" Mobility   Outcome Measure  Help needed turning from your back to your side while in a flat bed without using bedrails?: None Help needed moving from lying on your back to sitting on the side of a flat bed without using bedrails?: A Little Help needed moving to and from a bed to a chair (including a wheelchair)?: A Little Help needed standing up from a chair using your arms (e.g., wheelchair or bedside chair)?: A Little Help needed to walk in  hospital room?: A Little Help needed climbing 3-5 steps with a railing? : A Little 6 Click Score: 19    End of Session Equipment Utilized During Treatment: Gait belt Activity Tolerance: Patient tolerated treatment well Patient left: in chair;with call bell/phone within reach;with chair alarm set Nurse Communication: Mobility status;Precautions PT Visit Diagnosis: Unsteadiness on feet (R26.81);Other abnormalities of gait and mobility (R26.89);Muscle weakness (generalized) (M62.81);History of falling (Z91.81);Pain Pain - Right/Left: Left Pain - part of body: Hand     Time: 1308-6578 PT Time Calculation (min) (ACUTE ONLY): 42 min  Charges:    $Gait Training: 8-22 mins $Therapeutic Activity: 8-22 mins PT General Charges $$ ACUTE PT VISIT: 1 Visit                     Hendricks Limes, PT 08/29/23, 1:24 PM

## 2023-08-29 NOTE — Consult Note (Signed)
PHARMACY - ANTICOAGULATION CONSULT NOTE  Pharmacy Consult for Heparin Indication: chest pain/ACS  Allergies  Allergen Reactions   Statins     Other Reaction(s): arthralgia (joint pain) Patient able to tolerate Crestor 10 mg   Hctz [Hydrochlorothiazide] Other (See Comments)    Extreme Cramping   Metformin And Related Diarrhea    Patient Measurements: Height: 5\' 11"  (180.3 cm) IBW/kg (Calculated) : 75.3 Heparin Dosing Weight: 105.4 kg  Vital Signs: Temp: 98.1 F (36.7 C) (02/14 0358) Temp Source: Oral (02/14 0358) BP: 129/68 (02/14 0358) Pulse Rate: 67 (02/14 0358)  Labs: Recent Labs     0000 08/26/23 0829 08/26/23 1120 08/26/23 1250 08/26/23 1443 08/26/23 2356 08/27/23 0251 08/27/23 0841 08/27/23 1409 08/27/23 1805 08/28/23 0216 08/28/23 1222 08/28/23 2135 08/29/23 0632  HGB   < >  --   --   --   --   --  13.5  --   --   --  12.8*  --   --  13.1  HCT  --   --   --   --   --   --  39.2  --   --   --  37.2*  --   --  38.4*  PLT  --   --   --   --   --   --  143*  --   --   --  138*  --   --  176  APTT  --   --   --   --   --  >200* 34  --   --   --   --   --   --   --   HEPARINUNFRC  --   --   --   --    < > >1.10*  --    < >  --    < > 0.18* 0.21* 0.30 0.51  CREATININE   < >  --   --   --   --   --  0.69  --  0.62  --  0.57*  --   --  0.62  TROPONINIHS  --  1,071* 1,226* 817*  --   --   --   --   --   --   --   --   --   --    < > = values in this interval not displayed.    Estimated Creatinine Clearance: 137.5 mL/min (by C-G formula based on SCr of 0.62 mg/dL).   Medical History: Past Medical History:  Diagnosis Date   Diabetes mellitus    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 type 2 diabetes mellitus, limited to breakdown of skin (HCC) 04/23/2019   Fracture of one rib, left side, subsequent encounter for fracture with routine healing 08/27/2020   Hyperlipidemia    Hypertension    Normal cardiac stress test 2012   Fath    Medications:   No history of chronic anticoagulant use PTA  Assessment: 58yo patient presents to the ED via [facility] from home. Pt arrives with fever, AMS, N/V/D, abdominal pain, and increased urination. He was seen by his doctor today and was at his baseline mental status at that time but subsequent became confused. The patient himself states he feels relatively okay. Troponin levels of 89>272 and EKG showing ST elevation. Pharmacy has been consulted to initiate and monitor continuous heparin infusion.  2/14: Hgb and plt stable. No s/sx of bleeding noted and HL therapeutic.  Date Time HL Rate/Comment  2/11 0457 0.44 Therapeutic x1 at 1500un/hr, HL drawn early this AM, repeat sample was hemolyzed, third sample sent late 2/11 1443 0.33 Therapeutic x2 2/12 0100  Infusion HELD and protamine given, received 250,000 u in 2 hrs 2/12 1805 0.11 Subtherapeutic, Heparin resumed at 11:55, rate 1500 un/hr>1600 2/13 0216 0.18 Subtherapeutic@1600  units/hr 2/13 1222  0.21 Subtherapeutic@1900  units/hr 2/13 2135 0.30 Therapeutic x 1 but low end of range @2200  units/hr 2/14 7829 0.51 Therapeutic  Goal of Therapy:  Heparin level 0.3-0.7 units/ml Monitor platelets by anticoagulation protocol: Yes   Plan: Continue heparin infusion at 2400 units/hr.  Recheck heparin level in 6 hours to confirm therapeutic rate. CBC daily while on heparin.   Kamora Vossler Rodriguez-Guzman [name], BCPS 08/29/2023 8:02 AM

## 2023-08-29 NOTE — Consult Note (Signed)
PHARMACY - ANTICOAGULATION CONSULT NOTE  Pharmacy Consult for Heparin Indication: chest pain/ACS  Allergies  Allergen Reactions   Statins     Other Reaction(s): arthralgia (joint pain) Patient able to tolerate Crestor 10 mg   Hctz [Hydrochlorothiazide] Other (See Comments)    Extreme Cramping   Metformin And Related Diarrhea    Patient Measurements: Height: 5\' 11"  (180.3 cm) IBW/kg (Calculated) : 75.3 Heparin Dosing Weight: 105.4 kg  Vital Signs: Temp: 98.1 F (36.7 C) (02/14 1219) Temp Source: Oral (02/14 0358) BP: 155/90 (02/14 1219) Pulse Rate: 81 (02/14 1219)  Labs: Recent Labs     0000 08/26/23 2356 08/27/23 0251 08/27/23 0841 08/27/23 1409 08/27/23 1805 08/28/23 0216 08/28/23 1222 08/28/23 2135 08/29/23 0632 08/29/23 1308  HGB   < >  --  13.5  --   --   --  12.8*  --   --  13.1  --   HCT  --   --  39.2  --   --   --  37.2*  --   --  38.4*  --   PLT  --   --  143*  --   --   --  138*  --   --  176  --   APTT  --  >200* 34  --   --   --   --   --   --   --   --   HEPARINUNFRC  --  >1.10*  --    < >  --    < > 0.18*   < > 0.30 0.51 0.55  CREATININE   < >  --  0.69  --  0.62  --  0.57*  --   --  0.62  --    < > = values in this interval not displayed.    Estimated Creatinine Clearance: 137.5 mL/min (by C-G formula based on SCr of 0.62 mg/dL).   Medical History: Past Medical History:  Diagnosis Date   Diabetes mellitus    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 type 2 diabetes mellitus, limited to breakdown of skin (HCC) 04/23/2019   Fracture of one rib, left side, subsequent encounter for fracture with routine healing 08/27/2020   Hyperlipidemia    Hypertension    Normal cardiac stress test 2012   Fath    Medications:  No history of chronic anticoagulant use PTA  Assessment: 57yo patient presents to the ED via [facility] from home. Pt arrives with fever, AMS, N/V/D, abdominal pain, and increased urination. He was seen by his doctor today and  was at his baseline mental status at that time but subsequent became confused. The patient himself states he feels relatively okay. Troponin levels of 89>272 and EKG showing ST elevation. Pharmacy has been consulted to initiate and monitor continuous heparin infusion.  2/14: Hgb and plt stable. No s/sx of bleeding noted and HL therapeutic.   Date Time HL Rate/Comment  2/11 0457 0.44 Therapeutic x1 at 1500un/hr, HL drawn early this AM, repeat sample was hemolyzed, third sample sent late 2/11 1443 0.33 Therapeutic x2 2/12 0100  Infusion HELD and protamine given, received 250,000 u in 2 hrs 2/12 1805 0.11 Subtherapeutic, Heparin resumed at 11:55, rate 1500 un/hr>1600 2/13 0216 0.18 Subtherapeutic@1600  units/hr 2/13 1222  0.21 Subtherapeutic@1900  units/hr 2/13 2135 0.30 Therapeutic x 1 but low end of range @2200  units/hr 2/14 0632 0.51 Therapeutic 2/14 1308 0.55 Therapuetic x 2  Goal of Therapy:  Heparin level 0.3-0.7 units/ml  Monitor platelets by anticoagulation protocol: Yes   Plan: Continue heparin infusion at 2400 units/hr.  Recheck heparin level with AM labs CBC daily while on heparin.   Onnie Hatchel Rodriguez-Guzman [name], BCPS 08/29/2023 3:29 PM

## 2023-08-29 NOTE — Plan of Care (Signed)
  Problem: Education: Goal: Understanding of cardiac disease, CV risk reduction, and recovery process will improve Outcome: Progressing Goal: Individualized Educational Video(s) Outcome: Progressing   Problem: Activity: Goal: Ability to tolerate increased activity will improve Outcome: Progressing   Problem: Cardiac: Goal: Ability to achieve and maintain adequate cardiovascular perfusion will improve Outcome: Progressing   Problem: Health Behavior/Discharge Planning: Goal: Ability to safely manage health-related needs after discharge will improve Outcome: Progressing   Problem: Education: Goal: Knowledge of General Education information will improve Description: Including pain rating scale, medication(s)/side effects and non-pharmacologic comfort measures Outcome: Progressing   Problem: Health Behavior/Discharge Planning: Goal: Ability to manage health-related needs will improve Outcome: Progressing   Problem: Clinical Measurements: Goal: Ability to maintain clinical measurements within normal limits will improve Outcome: Progressing Goal: Will remain free from infection Outcome: Progressing Goal: Diagnostic test results will improve Outcome: Progressing Goal: Respiratory complications will improve Outcome: Progressing Goal: Cardiovascular complication will be avoided Outcome: Progressing   Problem: Activity: Goal: Risk for activity intolerance will decrease Outcome: Progressing   Problem: Nutrition: Goal: Adequate nutrition will be maintained Outcome: Progressing   Problem: Coping: Goal: Level of anxiety will decrease Outcome: Progressing   Problem: Elimination: Goal: Will not experience complications related to bowel motility Outcome: Progressing Goal: Will not experience complications related to urinary retention Outcome: Progressing   Problem: Pain Managment: Goal: General experience of comfort will improve and/or be controlled Outcome: Progressing    Problem: Safety: Goal: Ability to remain free from injury will improve Outcome: Progressing   Problem: Skin Integrity: Goal: Risk for impaired skin integrity will decrease Outcome: Progressing   Problem: Activity: Goal: Ability to tolerate increased activity will improve Outcome: Progressing   Problem: Clinical Measurements: Goal: Ability to maintain a body temperature in the normal range will improve Outcome: Progressing   Problem: Respiratory: Goal: Ability to maintain adequate ventilation will improve Outcome: Progressing Goal: Ability to maintain a clear airway will improve Outcome: Progressing

## 2023-08-29 NOTE — Progress Notes (Signed)
       CROSS COVER NOTE  NAME: Connor Ross MRN: [id number] DOB : 05-13-1966 ATTENDING PHYSICIAN: Gillis Santa, MD    Date of Service   08/29/2023   HPI/Events of Note   Page from radiology patient regarding mri results.  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 Given involvement of multiple vascular territories, consider an embolic etiology. No mass effect. Patient admitted with SOB fever and tachycardia + flu A. Troponin elevated on admission, followed by cardiology and remains on heparin drip.  Cellulitis of left had. Found to have group B strept bacteremia  ECHO 08/27/2023 IMPRESSIONS     1. Left ventricular ejection fraction, by estimation, is 65 to 70%. The  left ventricle has normal function. The left ventricle has no regional  wall motion abnormalities. There is moderate concentric left ventricular  hypertrophy. Left ventricular  diastolic parameters are consistent with Grade II diastolic dysfunction  (pseudonormalization).   2. Right ventricular systolic function is normal. The right ventricular  size is normal.   3. The mitral valve is normal in structure. Trivial mitral valve  regurgitation.   4. The aortic valve is normal in structure. Aortic valve regurgitation is  not visualized.   Interventions   Assessment/Plan:    08/29/2023    8:00 PM 08/29/2023    4:28 PM 08/29/2023   12:19 PM  Vitals with BMI  Systolic 145 140 409  Diastolic 69 76 90  Pulse 78 84 81   Patient is without neuro deficits  NIHSS every 4 x 4 occurrences Reconsider TEE given nature of infarcts Will need neuro consult        Donnie Mesa NP [facility] Cover 7pm-7am - check amion for availability Pager [phone]

## 2023-08-29 NOTE — Plan of Care (Signed)
  Problem: Cardiac: Goal: Ability to achieve and maintain adequate cardiovascular perfusion will improve Outcome: Progressing   Problem: Health Behavior/Discharge Planning: Goal: Ability to manage health-related needs will improve Outcome: Progressing   Problem: Activity: Goal: Risk for activity intolerance will decrease Outcome: Progressing   Problem: Coping: Goal: Level of anxiety will decrease Outcome: Progressing

## 2023-08-30 DIAGNOSIS — J101 Influenza due to other identified influenza virus with other respiratory manifestations: Secondary | ICD-10-CM | POA: Diagnosis not present

## 2023-08-30 DIAGNOSIS — R410 Disorientation, unspecified: Secondary | ICD-10-CM | POA: Diagnosis not present

## 2023-08-30 DIAGNOSIS — J189 Pneumonia, unspecified organism: Secondary | ICD-10-CM | POA: Diagnosis not present

## 2023-08-30 DIAGNOSIS — I639 Cerebral infarction, unspecified: Secondary | ICD-10-CM

## 2023-08-30 DIAGNOSIS — I214 Non-ST elevation (NSTEMI) myocardial infarction: Secondary | ICD-10-CM | POA: Diagnosis not present

## 2023-08-30 LAB — PHOSPHORUS: Phosphorus: 3 mg/dL (ref 2.5–4.6)

## 2023-08-30 LAB — BASIC METABOLIC PANEL
Anion gap: 9 (ref 5–15)
BUN: 19 mg/dL (ref 6–20)
CO2: 24 mmol/L (ref 22–32)
Calcium: 8.2 mg/dL — ABNORMAL LOW (ref 8.9–10.3)
Chloride: 101 mmol/L (ref 98–111)
Creatinine, Ser: 0.65 mg/dL (ref 0.61–1.24)
GFR, Estimated: >60 mL/min (ref >60)
Glucose, Bld: 220 mg/dL — ABNORMAL HIGH (ref 70–99)
Potassium: 3.5 mmol/L (ref 3.5–5.1)
Sodium: 134 mmol/L — ABNORMAL LOW (ref 135–145)

## 2023-08-30 LAB — CBC
HCT: 37.6 % — ABNORMAL LOW (ref 39.0–52.0)
Hemoglobin: 12.9 g/dL — ABNORMAL LOW (ref 13.0–17.0)
MCH: 30.6 pg (ref 26.0–34.0)
MCHC: 34.3 g/dL (ref 30.0–36.0)
MCV: 89.3 fL (ref 80.0–100.0)
Platelets: 231 10*3/uL (ref 150–400)
RBC: 4.21 MIL/uL — ABNORMAL LOW (ref 4.22–5.81)
RDW: 13.2 % (ref 11.5–15.5)
WBC: 10.4 10*3/uL (ref 4.0–10.5)
nRBC: 0 % (ref 0.0–0.2)

## 2023-08-30 LAB — MAGNESIUM: Magnesium: 2.3 mg/dL (ref 1.7–2.4)

## 2023-08-30 NOTE — Plan of Care (Signed)
  Problem: Education: Goal: Understanding of cardiac disease, CV risk reduction, and recovery process will improve Outcome: Progressing Goal: Individualized Educational Video(s) Outcome: Progressing   Problem: Activity: Goal: Ability to tolerate increased activity will improve Outcome: Progressing   Problem: Cardiac: Goal: Ability to achieve and maintain adequate cardiovascular perfusion will improve Outcome: Progressing   Problem: Health Behavior/Discharge Planning: Goal: Ability to safely manage health-related needs after discharge will improve Outcome: Progressing   Problem: Education: Goal: Knowledge of General Education information will improve Description: Including pain rating scale, medication(s)/side effects and non-pharmacologic comfort measures Outcome: Progressing   Problem: Health Behavior/Discharge Planning: Goal: Ability to manage health-related needs will improve Outcome: Progressing   Problem: Clinical Measurements: Goal: Ability to maintain clinical measurements within normal limits will improve Outcome: Progressing Goal: Will remain free from infection Outcome: Progressing Goal: Diagnostic test results will improve Outcome: Progressing Goal: Respiratory complications will improve Outcome: Progressing Goal: Cardiovascular complication will be avoided Outcome: Progressing   Problem: Activity: Goal: Risk for activity intolerance will decrease Outcome: Progressing   Problem: Nutrition: Goal: Adequate nutrition will be maintained Outcome: Progressing   Problem: Coping: Goal: Level of anxiety will decrease Outcome: Progressing   Problem: Elimination: Goal: Will not experience complications related to bowel motility Outcome: Progressing Goal: Will not experience complications related to urinary retention Outcome: Progressing   Problem: Pain Managment: Goal: General experience of comfort will improve and/or be controlled Outcome: Progressing    Problem: Safety: Goal: Ability to remain free from injury will improve Outcome: Progressing   Problem: Skin Integrity: Goal: Risk for impaired skin integrity will decrease Outcome: Progressing   Problem: Activity: Goal: Ability to tolerate increased activity will improve Outcome: Progressing   Problem: Clinical Measurements: Goal: Ability to maintain a body temperature in the normal range will improve Outcome: Progressing   Problem: Respiratory: Goal: Ability to maintain adequate ventilation will improve Outcome: Progressing Goal: Ability to maintain a clear airway will improve Outcome: Progressing

## 2023-08-30 NOTE — Progress Notes (Signed)
 SUBJECTIVE: Patient denies any chest pain or fever or chills appears to be alert and already oriented and not confused.   Vitals:   08/29/23 1628 08/29/23 2000 08/30/23 0020 08/30/23 0400  BP: (!) 140/76 (!) 145/69 139/79 (!) 144/71  Pulse: 84 78 75 75  Resp: 19 18  19   Temp: 98.6 F (37 C) 98.4 F (36.9 C) 98.1 F (36.7 C) 98.6 F (37 C)  TempSrc: Oral Oral Oral Oral  SpO2: 93% 96% 95% 94%  Height:        Intake/Output Summary (Last 24 hours) at 08/30/2023 0843 Last data filed at 08/30/2023 0023 Gross per 24 hour  Intake 440 ml  Output 2850 ml  Net -2410 ml    LABS: Basic Metabolic Panel: Recent Labs    08/29/23 0632 08/30/23 0428  NA 135 134*  K 3.5 3.5  CL 100 101  CO2 20* 24  GLUCOSE 137* 220*  BUN 18 19  CREATININE 0.62 0.65  CALCIUM 8.5* 8.2*  MG 2.3 2.3  PHOS 4.3 3.0   Liver Function Tests: No results for input(s): "AST", "ALT", "ALKPHOS", "BILITOT", "PROT", "ALBUMIN" in the last 72 hours. No results for input(s): "LIPASE", "AMYLASE" in the last 72 hours. CBC: Recent Labs    08/29/23 0632 08/30/23 0428  WBC 10.0 10.4  HGB 13.1 12.9*  HCT 38.4* 37.6*  MCV 89.1 89.3  PLT 176 231   Cardiac Enzymes: No results for input(s): "CKTOTAL", "CKMB", "CKMBINDEX", "TROPONINI" in the last 72 hours. BNP: Invalid input(s): "POCBNP" D-Dimer: No results for input(s): "DDIMER" in the last 72 hours. Hemoglobin A1C: No results for input(s): "HGBA1C" in the last 72 hours. Fasting Lipid Panel: No results for input(s): "CHOL", "HDL", "LDLCALC", "TRIG", "CHOLHDL", "LDLDIRECT" in the last 72 hours. Thyroid Function Tests: No results for input(s): "TSH", "T4TOTAL", "T3FREE", "THYROIDAB" in the last 72 hours.  Invalid input(s): "FREET3" Anemia Panel: No results for input(s): "VITAMINB12", "FOLATE", "FERRITIN", "TIBC", "IRON", "RETICCTPCT" in the last 72 hours.   PHYSICAL EXAM General: Well developed, well nourished, in no acute distress HEENT:  Normocephalic and  atramatic Neck:  No JVD.  Lungs: Clear bilaterally to auscultation and percussion. Heart: HRRR . Normal S1 and S2 without gallops or murmurs.  Abdomen: Bowel sounds are positive, abdomen soft and non-tender  Msk:  Back normal, normal gait. Normal strength and tone for age. Extremities: No clubbing, cyanosis or edema.   Neuro: Alert and oriented X 3. Psych:  Good affect, responds appropriately  TELEMETRY: Normal sinus rhythm  ASSESSMENT AND PLAN:    ICD-10-CM   1. Influenza A  J10.1     2. Delirium  R41.0     3. NSTEMI (non-ST elevated myocardial infarction) (HCC)  I21.4     4. Community acquired pneumonia, unspecified laterality  J18.9     5. Hypokalemia  E87.6       Principal Problem:   NSTEMI (non-ST elevated myocardial infarction) (HCC) Active Problems:   Sepsis due to pneumonia (HCC)   Hypokalemia   Metabolic acidosis   Type 2 diabetes mellitus with peripheral neuropathy (HCC)   Essential hypertension   BPH (benign prostatic hyperplasia)   Influenza A   Hypocalcemia   Septicemia due to group D Streptococcus (HCC)   Bacteremia due to group B Streptococcus   Sepsis due to group B Streptococcus (HCC)   Encephalopathy   Plan ,   Non-STEMI elevated troponins peaked at 1200 recommend conservative therapy anticoagulation with heparin for 48 to 72 hours Recommend aspirin Plavix for  6 months Do not necessarily recommend cardiac cath at this stage with his influenza A infection and strep bacteremia Hypertension reasonably controlled continue current medical management losartan metoprolol Hyperlipidemia maintain Crestor therapy for lipid management Influenza A infection continue current therapy supportive care Pneumonia influenza A as well as possible strep pneumonia continue current management broad-spectrum antibiotic therapy supportive care Subjective bacteremia fever chills vomiting but also flu positive in the ER possible right lower lobe pneumonia continue antibiotic  therapy   CVA , patient had a stroke with MRI yesterday showing small acute infarct in the infratentorial and supra tentorial brain and needs transesophageal echocardiogram to rule out cardiac source.  Thus patient will be set up for TEE Monday.  Adrian Blackwater, MD, [facility] 08/30/2023 8:43 AM

## 2023-08-30 NOTE — Consult Note (Incomplete)
 NEUROLOGY CONSULT NOTE   Date of service: August 30, 2023 Patient Name: Connor Ross MRN:  [id number] DOB:  11-30-1965 Chief Complaint: embolic strokes Requesting Provider: Gillis Santa, MD  History of Present Illness   This is a 58 yo man with a medical history significant for type 2 diabetes, hypertension, hyperlipidemia who presented to the emergency room with acute onset of fever and altered mental status.  He was tachycardic and tachypneic.  Influenza was positive and blood cultures positive in 1 bottle with Streptococcus he was placed on Rocephin. Blood culture came back (+) for GB Strep agalactiae and his urine was (+) for this is well. Due to persistent AMS MRI brain was performed that showed numerous small acute infarcts throughout the infra tentorial and supra tentorial brain.  This pattern was favored to be embolic given the involvement of multiple vascular territories. TTE did not show intracardiac clot. Cerebrovascular imaging has not yet been before. TNK was not considered due to unknown LKW.  Modified rankin score: 0-Completely asymptomatic and back to baseline post- stroke  NIHSS = 2 for questions, no focal deficits    ROS  Comprehensive ROS performed and pertinent positives documented in HPI   Past History   Past Medical History:  Diagnosis Date   Diabetes mellitus    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 type 2 diabetes mellitus, limited to breakdown of skin (HCC) 04/23/2019   Fracture of one rib, left side, subsequent encounter for fracture with routine healing 08/27/2020   Hyperlipidemia    Hypertension    Normal cardiac stress test 2012   [facility]    Past Surgical History:  Procedure Laterality Date   CORONARY STENT INTERVENTION N/A 06/25/2022   Procedure: CORONARY STENT INTERVENTION;  Surgeon: Alwyn Pea, MD;  Location: [facility] INVASIVE CV LAB;  Service: Cardiovascular;  Laterality: N/A;   CORONARY STENT INTERVENTION N/A 06/26/2022    Procedure: CORONARY STENT INTERVENTION;  Surgeon: Alwyn Pea, MD;  Location: [facility] INVASIVE CV LAB;  Service: Cardiovascular;  Laterality: N/A;   EXOSTECTOMY Right 06/2019   5th MT head  ([facility])   LEFT HEART CATH AND CORONARY ANGIOGRAPHY N/A 06/25/2022   Procedure: LEFT HEART CATH AND CORONARY ANGIOGRAPHY WITH POSSIBLE STENT;  Surgeon: Alwyn Pea, MD;  Location: [facility] INVASIVE CV LAB;  Service: Cardiovascular;  Laterality: N/A;  12:30 following DC first case   LUMBAR DISC SURGERY      Family History: Family History  Problem Relation Age of Onset   Coronary artery disease Mother 19   COPD Mother    Heart disease Mother    Hyperlipidemia Mother    Hypertension Mother    Cancer Father    Hyperlipidemia Father    Hypertension Brother    Cancer Brother        lymphoma   Diabetes Maternal Aunt    Diabetes Maternal Uncle    Diabetes Maternal Grandmother    Heart disease Maternal Grandmother     Social History  reports that he has never smoked. He has never used smokeless tobacco. He reports that he does not drink alcohol and does not use drugs.  Allergies  Allergen Reactions   Statins     Other Reaction(s): arthralgia (joint pain) Patient able to tolerate Crestor 10 mg   Hctz [Hydrochlorothiazide] Other (See Comments)    Extreme Cramping   Metformin And Related Diarrhea    Medications   Current Facility-Administered Medications:    acetaminophen (TYLENOL) tablet 650 mg,  650 mg, Oral, Q4H PRN, Mansy, Jan A, MD, 650 mg at 08/26/23 2156   ALPRAZolam Prudy Feeler) tablet 0.25 mg, 0.25 mg, Oral, BID PRN, Mansy, Jan A, MD   aspirin EC tablet 81 mg, 81 mg, Oral, Daily, Mansy, Jan A, MD, 81 mg at 08/30/23 0981   calcium-vitamin D (OSCAL WITH D) 500-5 MG-MCG per tablet 2 tablet, 2 tablet, Oral, TID, Marrion Coy, MD, 2 tablet at 08/30/23 1914   cefTRIAXone (ROCEPHIN) 2 g in sodium chloride 0.9 % 100 mL IVPB, 2 g, Intravenous, Q24H, Zeigler, Burtis Junes, RPH, Last Rate: 200  mL/hr at 08/29/23 1744, 2 g at 08/29/23 1744   chlorpheniramine-HYDROcodone (TUSSIONEX) 10-8 MG/5ML suspension 5 mL, 5 mL, Oral, Q12H PRN, Mansy, Jan A, MD   clopidogrel (PLAVIX) tablet 75 mg, 75 mg, Oral, Daily, Mansy, Jan A, MD, 75 mg at 08/30/23 7829   empagliflozin (JARDIANCE) tablet 25 mg, 25 mg, Oral, Daily, Mansy, Jan A, MD, 25 mg at 08/30/23 5621   furosemide (LASIX) tablet 20 mg, 20 mg, Oral, QODAY, Mansy, Jan A, MD, 20 mg at 08/30/23 3086   gabapentin (NEURONTIN) capsule 100 mg, 100 mg, Oral, TID, Mansy, Jan A, MD, 100 mg at 08/30/23 5784   guaiFENesin (MUCINEX) 12 hr tablet 600 mg, 600 mg, Oral, BID, Mansy, Jan A, MD, 600 mg at 08/30/23 6962   ipratropium-albuterol (DUONEB) 0.5-2.5 (3) MG/3ML nebulizer solution 3 mL, 3 mL, Nebulization, Q4H PRN, Gillis Santa, MD   losartan (COZAAR) tablet 100 mg, 100 mg, Oral, Daily, Mansy, Jan A, MD, 100 mg at 08/30/23 9528   magnesium hydroxide (MILK OF MAGNESIA) suspension 30 mL, 30 mL, Oral, Daily PRN, Mansy, Jan A, MD   metoprolol tartrate (LOPRESSOR) tablet 50 mg, 50 mg, Oral, BID, Mansy, Jan A, MD, 50 mg at 08/30/23 4132   morphine (PF) 2 MG/ML injection 2 mg, 2 mg, Intravenous, Q2H PRN, Mansy, Jan A, MD   multivitamin with minerals tablet 1 tablet, 1 tablet, Oral, Daily, Mansy, Jan A, MD, 1 tablet at 08/30/23 4401   nitroGLYCERIN (NITROSTAT) SL tablet 0.4 mg, 0.4 mg, Sublingual, Q5 Min x 3 PRN, Mansy, Jan A, MD   ondansetron [facility]) injection 4 mg, 4 mg, Intravenous, Q6H PRN, Mansy, Jan A, MD, 4 mg at 08/27/23 1245   pantoprazole (PROTONIX) EC tablet 40 mg, 40 mg, Oral, Daily, Gillis Santa, MD, 40 mg at 08/30/23 0272   rosuvastatin (CRESTOR) tablet 40 mg, 40 mg, Oral, QPM, Mansy, Jan A, MD, 40 mg at 08/29/23 1741   tamsulosin (FLOMAX) capsule 0.4 mg, 0.4 mg, Oral, Daily, Mansy, Jan A, MD, 0.4 mg at 08/30/23 5366   traMADol (ULTRAM) tablet 50 mg, 50 mg, Oral, Q6H PRN, Mansy, Jan A, MD   traZODone (DESYREL) tablet 25 mg, 25 mg, Oral, QHS PRN,  Mansy, Vernetta Honey, MD   Vitamin D (Ergocalciferol) (DRISDOL) 1.25 MG (50000 UNIT) capsule 50,000 Units, 50,000 Units, Oral, Weekly, Mansy, Jan A, MD, 50,000 Units at 08/26/23 0941  Vitals   Vitals:   08/29/23 2000 08/30/23 0020 08/30/23 0400 08/30/23 1308  BP: (!) 145/69 139/79 (!) 144/71 137/84  Pulse: 78 75 75 84  Resp: 18  19   Temp: 98.4 F (36.9 C) 98.1 F (36.7 C) 98.6 F (37 C) (!) 97.4 F (36.3 C)  TempSrc: Oral Oral Oral Oral  SpO2: 96% 95% 94% 96%  Height:        Body mass index is 40.47 kg/m.  Physical Exam   Gen: A&Ox4, NAD HEENT: Atraumatic, normocephalic;  oropharynx clear, tongue without atrophy or fasciculations. Resp: CTAB, normal work of breathing CV: RRR, extremities appear well-perfused. Abd: soft/NT/ND Extrem: Nml bulk; no cyanosis, clubbing, or edema.  Neurologic Examination   *MS: Oriented to self and hospital. States incorrect age and month. Follows simplecommands.  *Speech: no dysarthria or aphasia, able to name and repeat. *CN:    I: Deferred   II,III: PERRLA, VFF by confrontation, optic discs not visualized 2/2 pupillary constriction   III,IV,VI: EOMI w/o nystagmus, no ptosis   V: Sensation intact from V1 to V3 to LT   VII: Eyelid closure was full.  Smile symmetric.   VIII: Hearing intact to voice   IX,X: Voice normal, palate elevates symmetrically    XI: SCM/trap 5/5 bilat   XII: Tongue protrudes midline, no atrophy or fasciculations  *Motor:   Normal bulk.  No tremor, rigidity or bradykinesia. No pronator drift.   Strength: Dlt Bic Tri WE WrF FgS Gr HF KnF KnE PlF DoF    Left 5 5 5 5 5 5 5 5 5 5 5 5     Right 5 5 5 5 5 5 5 5 5 5 5 5    *Sensory: Intact to light touch, pinprick, temperature vibration throughout. Symmetric. Propioception intact bilat.  No double-simultaneous extinction.  *Coordination:  Finger-to-nose, heel-to-shin, rapid alternating motions were intact. *Reflexes:  2+ and symmetric throughout without clonus; toes down-going  bilat *Gait: deferred  Labs/Imaging/Neurodiagnostic studies   CBC:  Recent Labs  Lab 09-19-2023 2130 08/26/23 0457 08/29/23 0632 08/30/23 0428  WBC 9.3   < > 10.0 10.4  NEUTROABS 8.1*  --   --   --   HGB 14.5   < > 13.1 12.9*  HCT 42.4   < > 38.4* 37.6*  MCV 90.8   < > 89.1 89.3  PLT 181   < > 176 231   < > = values in this interval not displayed.   Basic Metabolic Panel:  Lab Results  Component Value Date   NA 134 (L) 08/30/2023   K 3.5 08/30/2023   CO2 24 08/30/2023   GLUCOSE 220 (H) 08/30/2023   BUN 19 08/30/2023   CREATININE 0.65 08/30/2023   CALCIUM 8.2 (L) 08/30/2023   GFRNONAA >60 08/30/2023   GFRAA >60 04/03/2012   Lipid Panel:  Lab Results  Component Value Date   LDLCALC 39 08/26/2023   HgbA1c:  Lab Results  Component Value Date   HGBA1C 5.9 07/04/2023   Urine Drug Screen: No results found for: "LABOPIA", "COCAINSCRNUR", "LABBENZ", "AMPHETMU", "THCU", "LABBARB"  Alcohol Level No results found for: "ETH" INR  Lab Results  Component Value Date   INR 1.1 08/26/2023   APTT  Lab Results  Component Value Date   APTT 34 08/27/2023   AED levels: No results found for: "PHENYTOIN", "ZONISAMIDE", "LAMOTRIGINE", "LEVETIRACETA"  MRI Brain wwo (Personally reviewed): Numerous small acute infarcts throughout the infratentorial and supratentorial brain. Given involvement of multiple vascular Territories consider central embolic source.  TTE 1. Left ventricular ejection fraction, by estimation, is 65 to 70% . The left ventricle has normal function. The left ventricle has no regional wall motion abnormalities. There is moderate concentric left ventricular hypertrophy. Left ventricular diastolic parameters are consistent with Grade II diastolic dysfunction ( pseudonormalization) . 2. Right ventricular systolic function is normal. The right ventricular size is normal. 3. The mitral valve is normal in structure. Trivial mitral valve regurgitation. 4. The aortic valve  is normal in structure. Aortic valve regurgitation is not visualized.  Stroke Labs     Component Value Date/Time   CHOL 73 08/26/2023 0458   TRIG 83 08/26/2023 0458   HDL 17 (L) 08/26/2023 0458   CHOLHDL 4.3 08/26/2023 0458   VLDL 17 08/26/2023 0458   LDLCALC 39 08/26/2023 0458   LDLDIRECT 48.0 07/02/2023 0946    Lab Results  Component Value Date/Time   HGBA1C 5.9 07/04/2023 12:00 AM     ASSESSMENT   Connor Ross is a 58 y.o. male who presented with fever and AMS and was found to have multifocal embolic ischemic infarcts in the setting of bacteremia is concerning for possible septic emboli. Alternatively intracardiac clot could cause emboli to multiple cerebrovascular territories. He will need TEE for further evaluation. ID is following and he is currently on ceftriaxone.   RECOMMENDATIONS   - Permissive HTN up to 220/120 for another 24 hrs; after that gently reduce goal to normotension; avoid hypotension - Pt needs TEE to r/o valve vegetation and LV thrombus, tentatively scheduled for Monday - Abx per ID - Continue DAPT with ASA and plavix - Continue crestor 40mg  daily - CTA H&N when able (prioritize TEE) - Neurochecks and NIHSS q 4 hrs - STAT head CT for any decline in neuro exam - PT/OT/SLP - Will continue to follow ______________________________________________________________________    Signed, Jefferson Fuel, MD [facility]

## 2023-08-30 NOTE — Plan of Care (Signed)
  Problem: Education: Goal: Understanding of cardiac disease, CV risk reduction, and recovery process will improve Outcome: Progressing Goal: Individualized Educational Video(s) Outcome: Progressing   Problem: Activity: Goal: Ability to tolerate increased activity will improve Outcome: Progressing   Problem: Cardiac: Goal: Ability to achieve and maintain adequate cardiovascular perfusion will improve Outcome: Progressing   Problem: Education: Goal: Knowledge of General Education information will improve Description: Including pain rating scale, medication(s)/side effects and non-pharmacologic comfort measures Outcome: Progressing   Problem: Safety: Goal: Ability to remain free from injury will improve Outcome: Progressing   Problem: Clinical Measurements: Goal: Diagnostic test results will improve Outcome: Progressing Goal: Cardiovascular complication will be avoided Outcome: Progressing

## 2023-08-30 NOTE — Progress Notes (Signed)
 Progress Note   Patient: Connor Ross:[id number] DOB: [birth year] DOA: 08/25/2023     4 DOS: the patient was seen and examined on 08/30/2023   Brief hospital course: PHARAOH PIO is a 58 y.o. male with medical history significant for type diabetes mellitus, hypertension and dyslipidemia, who presented to the emergency room with acute onset of fever with altered mental status  Patient was found to have tachycardia, tachypnea.  No leukocytosis.  Influenza was positive, blood culture was positive in 1 bottle with Streptococcus.  She was placed on Rocephin.  Patient also had elevated troponin peaked at 1226, seen by cardiology and placed on heparin drip.   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   Troponin peaked 1226, trended down, most likely has non-STEMI.  Cardiology consulted, cont Hep gtt for now, will need cath after improvement, can not do it due to bacteremia 2/14 discontinued heparin IV infusion after 48 hours  # Acute hypoxic respiratory failure secondary to pneumonia, resolved # Sepsis due to pneumonia  # Influenza A + # Bacteremia, GB Strep  agalactiae   # Left hand cellulitis. Patient did meet sepsis criteria at time of admission with tachycardia and tachypnea.   Chest x-ray and CT scan also showed left lower lobe airspace disease consistent with pneumonia. Patient blood culture positive for Streptococcus agalactia.  The urine culture performed on 09/2022 also positive for Streptococcus agalactia.  Patient also has left hand swelling and red, states that he might had a spider bite. x-ray negative for Fx. He has multiple reasons for bacteremia, but exact etiology is unclear.  No need of TEE as per ID   ID consulted. Continue Rocephin for now. Patient is symptom of the flu started last Wednesday, out of the window for Tamiflu. 2/12 repeat Bld Cx NGTD 2/13 started pantoprazole 40 mg p.o. daily  # Hypokalemia, K repleted  # Hypophosphatemia, Phos repleted # Metabolic acidosis. Resolved, s/p bicarb drip. Monitored electrolytes daily  # BPH (benign prostatic hyperplasia) - resumed Flomax.  # Essential hypertension - continue antihypertensive therapy.  # Type 2 diabetes mellitus with peripheral neuropathy - The patient will be placed on supplemental coverage with NovoLog. - continue basal coverage. - continue Neurontin.  # Morbid obesity with BMI 40.47.   Diet and excise advised.  # Debility. Patient was severely debilitated at home, largely due to joint arthritis.  Obtain PT/OT.   Subjective:  No significant overnight events, patient was sitting audibly in the recliner, no acute neurological findings.  Patient's wife was at bedside, she was concerned about his memory and forgetfulness.  Informed regarding acute embolic CVA.  Management plan discussed Patient's left hand swelling is getting better, denied any chest  pain or palpitation, no shortness of breath, no any other active issues   Physical Exam: Vitals:   08/29/23 2000 08/30/23 0020 08/30/23 0400 08/30/23 1308  BP: (!) 145/69 139/79 (!) 144/71 137/84  Pulse:  78 75 75 84  Resp: 18  19   Temp: 98.4 F (36.9 C) 98.1 F (36.7 C) 98.6 F (37 C) (!) 97.4 F (36.3 C)  TempSrc: Oral Oral Oral Oral  SpO2: 96% 95% 94% 96%  Height:       General exam: Appears calm and comfortable, morbid obese. Respiratory system: Mild b/l crackles, no wheezes, no resp distress, normal work of breathing  Cardiovascular system: S1 & S2 heard, RRR. No JVD, murmurs, rubs, gallops or clicks. No pedal edema. Gastrointestinal system: Abdomen is nondistended, soft and nontender. No organomegaly or masses felt. Normal bowel sounds heard. Central nervous system: Alert and oriented. No focal neurological deficits. Extremities: Left Hand erythema almost resolved, mild swelling is still there  Skin: No rashes, lesions or ulcers Psychiatry: Judgement and insight appear normal. Mood & affect appropriate.    Data Reviewed:  Reviewed CT scan results, chest x-ray results   Family Communication: Wife updated at bedside.  Disposition: Status is: Inpatient Remains inpatient appropriate because: Severity of disease, IV treatment.  DVT Prophylaxis:    Time spent: 55 min  Author: Gillis Santa, MD 08/30/2023 2:25 PM  For on call review [email or web address].

## 2023-08-30 NOTE — Progress Notes (Signed)
 ID I reviewed the MRI report . has multiple numerous small acute infarcts of multiple lobes of brain, b/l cerebellum , thalami and left basal ganglia.Indicative of embolic infarct. He has Group B strep bacteremia which is not a high risk bacteria to cause endocarditis. He needs TEE to r/o valve vegetation and LV thrombus., continue ceftriaxone IV    I am not on call this weekend- but communicated with Dr.kumar Also spoke to his wife over the phone.

## 2023-08-31 ENCOUNTER — Inpatient Hospital Stay: Payer: Medicare HMO

## 2023-08-31 DIAGNOSIS — I214 Non-ST elevation (NSTEMI) myocardial infarction: Secondary | ICD-10-CM | POA: Diagnosis not present

## 2023-08-31 LAB — BASIC METABOLIC PANEL
Anion gap: 12 (ref 5–15)
BUN: 20 mg/dL (ref 6–20)
CO2: 24 mmol/L (ref 22–32)
Calcium: 8.7 mg/dL — ABNORMAL LOW (ref 8.9–10.3)
Chloride: 99 mmol/L (ref 98–111)
Creatinine, Ser: 0.77 mg/dL (ref 0.61–1.24)
GFR, Estimated: >60 mL/min (ref >60)
Glucose, Bld: 169 mg/dL — ABNORMAL HIGH (ref 70–99)
Potassium: 4.3 mmol/L (ref 3.5–5.1)
Sodium: 135 mmol/L (ref 135–145)

## 2023-08-31 LAB — CBC
HCT: 40.7 % (ref 39.0–52.0)
Hemoglobin: 13.7 g/dL (ref 13.0–17.0)
MCH: 30.6 pg (ref 26.0–34.0)
MCHC: 33.7 g/dL (ref 30.0–36.0)
MCV: 90.8 fL (ref 80.0–100.0)
Platelets: 293 10*3/uL (ref 150–400)
RBC: 4.48 MIL/uL (ref 4.22–5.81)
RDW: 13.2 % (ref 11.5–15.5)
WBC: 13.5 10*3/uL — ABNORMAL HIGH (ref 4.0–10.5)
nRBC: 0 % (ref 0.0–0.2)

## 2023-08-31 MED ORDER — IOHEXOL 350 MG/ML SOLN
75.0000 mL | Freq: Once | INTRAVENOUS | Status: AC | PRN
  Administered 2023-08-31: 75 mL via INTRAVENOUS

## 2023-08-31 NOTE — Plan of Care (Signed)
 Neurology plan of care  Please see neurology consult from 08/30/23 for full findings and recommendations. Patient is scheduled for TEE tomorrow to evaluate for embolic source (valve vegetations in setting of bacteremia vs intracardiac clot). Neurology will follow up tomorrow after TEE results are available.  Updates recommendations: - Outside window for permissive HTN; goal normotension, avoid hypotension - Pt needs TEE to r/o valve vegetation and LV thrombus, tentatively scheduled for Monday - Abx per ID - Continue DAPT with ASA and plavix - Continue crestor 40mg  daily - CTA H&N ordered and pending - Neurochecks and NIHSS q 4 hrs - STAT head CT for any decline in neuro exam - PT/OT/SLP - Will continue to follow  Bing Neighbors, MD [facility] [phone]  If 7pm- 7am, please page neurology on call as listed in [facility]ION.

## 2023-08-31 NOTE — Progress Notes (Signed)
 SUBJECTIVE: Patient denies any chest pain or shortness of breath lying in bed comfortably   Vitals:   08/30/23 1938 08/31/23 0000 08/31/23 0400 08/31/23 0900  BP: 130/85 138/82 139/78 (!) 117/50  Pulse: 88 85 71 94  Resp: 15 16 18 17   Temp: 98.1 F (36.7 C) 98.5 F (36.9 C) 98.3 F (36.8 C) 97.8 F (36.6 C)  TempSrc: Oral Oral Oral Axillary  SpO2: 95% 97% 98% 93%  Height:        Intake/Output Summary (Last 24 hours) at 08/31/2023 1418 Last data filed at 08/31/2023 0432 Gross per 24 hour  Intake 240 ml  Output 3050 ml  Net -2810 ml    LABS: Basic Metabolic Panel: Recent Labs    08/29/23 0632 08/30/23 0428 08/31/23 0312  NA 135 134* 135  K 3.5 3.5 4.3  CL 100 101 99  CO2 20* 24 24  GLUCOSE 137* 220* 169*  BUN 18 19 20   CREATININE 0.62 0.65 0.77  CALCIUM 8.5* 8.2* 8.7*  MG 2.3 2.3  --   PHOS 4.3 3.0  --    Liver Function Tests: No results for input(s): "AST", "ALT", "ALKPHOS", "BILITOT", "PROT", "ALBUMIN" in the last 72 hours. No results for input(s): "LIPASE", "AMYLASE" in the last 72 hours. CBC: Recent Labs    08/30/23 0428 08/31/23 0312  WBC 10.4 13.5*  HGB 12.9* 13.7  HCT 37.6* 40.7  MCV 89.3 90.8  PLT 231 293   Cardiac Enzymes: No results for input(s): "CKTOTAL", "CKMB", "CKMBINDEX", "TROPONINI" in the last 72 hours. BNP: Invalid input(s): "POCBNP" D-Dimer: No results for input(s): "DDIMER" in the last 72 hours. Hemoglobin A1C: No results for input(s): "HGBA1C" in the last 72 hours. Fasting Lipid Panel: No results for input(s): "CHOL", "HDL", "LDLCALC", "TRIG", "CHOLHDL", "LDLDIRECT" in the last 72 hours. Thyroid Function Tests: No results for input(s): "TSH", "T4TOTAL", "T3FREE", "THYROIDAB" in the last 72 hours.  Invalid input(s): "FREET3" Anemia Panel: No results for input(s): "VITAMINB12", "FOLATE", "FERRITIN", "TIBC", "IRON", "RETICCTPCT" in the last 72 hours.   PHYSICAL EXAM General: Well developed, well nourished, in no acute  distress HEENT:  Normocephalic and atramatic Neck:  No JVD.  Lungs: Clear bilaterally to auscultation and percussion. Heart: HRRR . Normal S1 and S2 without gallops or murmurs.  Abdomen: Bowel sounds are positive, abdomen soft and non-tender  Msk:  Back normal, normal gait. Normal strength and tone for age. Extremities: No clubbing, cyanosis or edema.   Neuro: Alert and oriented X 3. Psych:  Good affect, responds appropriately  TELEMETRY: Normal sinus rhythm  ASSESSMENT AND PLAN:    ICD-10-CM   1. Influenza A  J10.1     2. Delirium  R41.0     3. NSTEMI (non-ST elevated myocardial infarction) (HCC)  I21.4     4. Community acquired pneumonia, unspecified laterality  J18.9     5. Hypokalemia  E87.6       Principal Problem:   NSTEMI (non-ST elevated myocardial infarction) (HCC) Active Problems:   Sepsis due to pneumonia (HCC)   Hypokalemia   Metabolic acidosis   Type 2 diabetes mellitus with peripheral neuropathy (HCC)   Essential hypertension   BPH (benign prostatic hyperplasia)   Influenza A   Hypocalcemia   Septicemia due to group D Streptococcus (HCC)   Bacteremia due to group B Streptococcus   Sepsis due to group B Streptococcus (HCC)   Encephalopathy      Plan ,   Non-STEMI elevated troponins peaked at 1200 recommend conservative therapy anticoagulation  with heparin for 48 to 72 hours Recommend aspirin Plavix for 6 months Do not necessarily recommend cardiac cath at this stage with his influenza A infection and strep bacteremia Hypertension reasonably controlled continue current medical management losartan metoprolol Hyperlipidemia maintain Crestor therapy for lipid management Influenza A infection continue current therapy supportive care Pneumonia influenza A as well as possible strep pneumonia continue current management broad-spectrum antibiotic therapy supportive care Subjective bacteremia fever chills vomiting but also flu positive in the ER possible right  lower lobe pneumonia continue antibiotic therapy   CVA , patient had a stroke with MRI showing small acute infarct in the infratentorial and supra tentorial brain and needs transesophageal echocardiogram to rule out cardiac source of emboli or vegetation with possible endocarditis as per recommendation by neurology.  Thus patient will be set up for TEE Monday under [facility] cardiology.         Connor Blackwater, MD, [facility] 08/31/2023 2:18 PM

## 2023-08-31 NOTE — Plan of Care (Signed)
  Problem: Education: Goal: Understanding of cardiac disease, CV risk reduction, and recovery process will improve Outcome: Progressing Goal: Individualized Educational Video(s) Outcome: Progressing   Problem: Activity: Goal: Ability to tolerate increased activity will improve Outcome: Progressing   Problem: Cardiac: Goal: Ability to achieve and maintain adequate cardiovascular perfusion will improve Outcome: Progressing   Problem: Clinical Measurements: Goal: Diagnostic test results will improve Outcome: Progressing Goal: Cardiovascular complication will be avoided Outcome: Progressing   Problem: Safety: Goal: Ability to remain free from injury will improve Outcome: Progressing

## 2023-08-31 NOTE — Progress Notes (Signed)
 Progress Note   Patient: Connor Ross ZOX:[id number] DOB: 03/23/1966 DOA: 08/25/2023     5 DOS: the patient was seen and examined on 08/31/2023   Brief hospital course: Connor Ross is a 58 y.o. male with medical history significant for type diabetes mellitus, hypertension and dyslipidemia, who presented to the emergency room with acute onset of fever with altered mental status  Patient was found to have tachycardia, tachypnea.  No leukocytosis.  Influenza was positive, blood culture was positive in 1 bottle with Streptococcus.  She was placed on Rocephin.  Patient also had elevated troponin peaked at 1226, seen by cardiology and placed on heparin drip.   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 after midnight, TEE tomorrow a.m.   # NSTEMI (non-ST elevated myocardial infarction) (HCC) Elevated troponin in the setting of influenza and septicemia.   Troponin peaked 1226, trended down, most likely has non-STEMI.  Cardiology consulted, cont Hep gtt for now, will need cath after improvement, can not do it due to bacteremia 2/14 discontinued heparin IV infusion after 48 hours  # Acute hypoxic respiratory failure secondary to pneumonia,  resolved # Sepsis due to pneumonia  # Influenza A + # Bacteremia, GB Strep agalactiae   # Left hand cellulitis. Patient did meet sepsis criteria at time of admission with tachycardia and tachypnea.   Chest x-ray and CT scan also showed left lower lobe airspace disease consistent with pneumonia. Patient blood culture positive for Streptococcus agalactia.  The urine culture performed on 09/2022 also positive for Streptococcus agalactia.  Patient also has left hand swelling and red, states that he might had a spider bite. x-ray negative for Fx. He has multiple reasons for bacteremia, but exact etiology is unclear.  No need of TEE as per ID   ID consulted. Continue Rocephin for now. Patient is symptom of the flu started last Wednesday, out of the window for Tamiflu. 2/12 repeat Bld Cx NGTD 2/13 started pantoprazole 40 mg p.o. daily  # Hypokalemia, K repleted  # Hypophosphatemia, Phos repleted # Metabolic acidosis. Resolved, s/p bicarb drip. Monitored electrolytes daily  # BPH (benign prostatic hyperplasia) - resumed Flomax.  # Essential hypertension - continue antihypertensive therapy.  # Type 2 diabetes mellitus with peripheral neuropathy - The patient will be placed on supplemental coverage with NovoLog. - continue basal coverage. - continue Neurontin.  # Morbid obesity with BMI 40.47.   Diet and excise advised.  # Debility. Patient was severely debilitated at home, largely due to joint arthritis.  Obtain PT/OT.   Subjective:  No significant events overnight.  Patient was sitting comfortably on the recliner.  Left hand swelling has improved, no pain. Denied any new neurological findings.  Still having some memory problems and forgetfulness.   Physical Exam: Vitals:  08/30/23 1938 08/31/23 0000 08/31/23 0400 08/31/23 0900  BP: 130/85 138/82 139/78 (!) 117/50  Pulse: 88 85 71 94  Resp: 15 16 18 17   Temp: 98.1 F (36.7 C) 98.5 F (36.9 C) 98.3 F (36.8 C) 97.8 F (36.6  C)  TempSrc: Oral Oral Oral Axillary  SpO2: 95% 97% 98% 93%  Height:       General exam: Appears calm and comfortable, morbid obese. Respiratory system: Mild b/l crackles, no wheezes, no resp distress, normal work of breathing  Cardiovascular system: S1 & S2 heard, RRR. No JVD, murmurs, rubs, gallops or clicks. No pedal edema. Gastrointestinal system: Abdomen is nondistended, soft and nontender. No organomegaly or masses felt. Normal bowel sounds heard. Central nervous system: Alert and oriented. No focal neurological deficits. Extremities: Left Hand erythema almost resolved, mild swelling is still there  Skin: No rashes, lesions or ulcers Psychiatry: Judgement and insight appear normal. Mood & affect appropriate.    Data Reviewed:  Reviewed CT scan results, chest x-ray results   Family Communication: Wife updated at bedside.  Disposition: Status is: Inpatient Remains inpatient appropriate because: Severity of disease, IV treatment.  DVT Prophylaxis:    Time spent: 45 min Author: Gillis Santa, MD 08/31/2023 4:20 PM  For on call review [email or web address].

## 2023-08-31 NOTE — Plan of Care (Signed)
  Problem: Education: Goal: Understanding of cardiac disease, CV risk reduction, and recovery process will improve Outcome: Progressing Goal: Individualized Educational Video(s) Outcome: Progressing   Problem: Activity: Goal: Ability to tolerate increased activity will improve Outcome: Progressing   Problem: Cardiac: Goal: Ability to achieve and maintain adequate cardiovascular perfusion will improve Outcome: Progressing   Problem: Health Behavior/Discharge Planning: Goal: Ability to safely manage health-related needs after discharge will improve Outcome: Progressing   Problem: Education: Goal: Knowledge of General Education information will improve Description: Including pain rating scale, medication(s)/side effects and non-pharmacologic comfort measures Outcome: Progressing   Problem: Health Behavior/Discharge Planning: Goal: Ability to manage health-related needs will improve Outcome: Progressing   Problem: Clinical Measurements: Goal: Ability to maintain clinical measurements within normal limits will improve Outcome: Progressing Goal: Will remain free from infection Outcome: Progressing Goal: Diagnostic test results will improve Outcome: Progressing Goal: Respiratory complications will improve Outcome: Progressing Goal: Cardiovascular complication will be avoided Outcome: Progressing   Problem: Activity: Goal: Risk for activity intolerance will decrease Outcome: Progressing   Problem: Nutrition: Goal: Adequate nutrition will be maintained Outcome: Progressing   Problem: Coping: Goal: Level of anxiety will decrease Outcome: Progressing   Problem: Elimination: Goal: Will not experience complications related to bowel motility Outcome: Progressing Goal: Will not experience complications related to urinary retention Outcome: Progressing   Problem: Pain Managment: Goal: General experience of comfort will improve and/or be controlled Outcome: Progressing    Problem: Safety: Goal: Ability to remain free from injury will improve Outcome: Progressing   Problem: Skin Integrity: Goal: Risk for impaired skin integrity will decrease Outcome: Progressing   Problem: Activity: Goal: Ability to tolerate increased activity will improve Outcome: Progressing   Problem: Clinical Measurements: Goal: Ability to maintain a body temperature in the normal range will improve Outcome: Progressing   Problem: Respiratory: Goal: Ability to maintain adequate ventilation will improve Outcome: Progressing Goal: Ability to maintain a clear airway will improve Outcome: Progressing

## 2023-08-31 NOTE — Progress Notes (Signed)
 Tried to call for patient scheduling for TEE on Monday 01/17, called anesthesiology, unable to contact as the phone was not received. Called to bed placement they recommended to call to Scheduling, tried to called Scheduling - Specialized, call was transferred to voicemail, left the voice mail to schedule and if possible call back to 2A.

## 2023-09-01 ENCOUNTER — Encounter
Admission: EM | Disposition: A | Discharge status: Other Acute Inpatient Hospital | Payer: Self-pay | Source: Home / Self Care | Attending: Student

## 2023-09-01 DIAGNOSIS — I214 Non-ST elevation (NSTEMI) myocardial infarction: Secondary | ICD-10-CM | POA: Diagnosis not present

## 2023-09-01 LAB — CULTURE, BLOOD (ROUTINE X 2)
Culture: NO GROWTH
Culture: NO GROWTH

## 2023-09-01 LAB — CBC
HCT: 41.1 % (ref 39.0–52.0)
Hemoglobin: 14.1 g/dL (ref 13.0–17.0)
MCH: 30.7 pg (ref 26.0–34.0)
MCHC: 34.3 g/dL (ref 30.0–36.0)
MCV: 89.5 fL (ref 80.0–100.0)
Platelets: 322 10*3/uL (ref 150–400)
RBC: 4.59 MIL/uL (ref 4.22–5.81)
RDW: 13.2 % (ref 11.5–15.5)
WBC: 12.7 10*3/uL — ABNORMAL HIGH (ref 4.0–10.5)
nRBC: 0 % (ref 0.0–0.2)

## 2023-09-01 LAB — VITAMIN D 25 HYDROXY (VIT D DEFICIENCY, FRACTURES): Vit D, 25-Hydroxy: 63.22 ng/mL (ref 30–100)

## 2023-09-01 LAB — BASIC METABOLIC PANEL
Anion gap: 15 (ref 5–15)
BUN: 18 mg/dL (ref 6–20)
CO2: 22 mmol/L (ref 22–32)
Calcium: 8.5 mg/dL — ABNORMAL LOW (ref 8.9–10.3)
Chloride: 99 mmol/L (ref 98–111)
Creatinine, Ser: 0.69 mg/dL (ref 0.61–1.24)
GFR, Estimated: >60 mL/min (ref >60)
Glucose, Bld: 193 mg/dL — ABNORMAL HIGH (ref 70–99)
Potassium: 3.9 mmol/L (ref 3.5–5.1)
Sodium: 136 mmol/L (ref 135–145)

## 2023-09-01 LAB — VITAMIN B12: Vitamin B-12: 292 pg/mL (ref 180–914)

## 2023-09-01 SURGERY — TRANSESOPHAGEAL ECHOCARDIOGRAM (TEE)
Anesthesia: General | Laterality: Right

## 2023-09-01 MED ORDER — SODIUM CHLORIDE 0.9 % IV SOLN
2.0000 g | Freq: Two times a day (BID) | INTRAVENOUS | Status: DC
  Administered 2023-09-01 – 2023-09-02 (×2): 2 g via INTRAVENOUS
  Filled 2023-09-01 (×3): qty 20

## 2023-09-01 MED ORDER — ENOXAPARIN SODIUM 40 MG/0.4ML IJ SOSY
40.0000 mg | PREFILLED_SYRINGE | Freq: Every evening | INTRAMUSCULAR | Status: DC
  Administered 2023-09-02: 40 mg via SUBCUTANEOUS
  Filled 2023-09-01: qty 0.4

## 2023-09-01 MED ORDER — ENOXAPARIN SODIUM 40 MG/0.4ML IJ SOSY: 40.0000 mg | PREFILLED_SYRINGE | Freq: Every evening | INTRAMUSCULAR | Status: DC

## 2023-09-01 NOTE — Progress Notes (Signed)
 [facility] CLINIC CARDIOLOGY PROGRESS NOTE       Patient ID: Connor Ross MRN: [id number] DOB/AGE: September 12, 1965 58 y.o.  Admit date: 08/25/2023 Referring Physician Dr. Valente David Primary Physician Sherlene Shams, MD  Primary Cardiologist Dr. Toy Baker, PA Reason for Consultation Elevated troponin  HPI: Connor Ross is a 58 y.o. male  with a past medical history of non-STEMI coronary disease (s/p previous stents to prox and mid LAD in remote past, s/p PCI and stent to distal RCA and overlapping prox RCA 06/2022) diabetes, hypertension, obesity, hyperlipidemia who presented to the ED on 08/25/2023 for fever, vomiting, diarrhea. Troponins checked and found to be elevated. Cardiology was consulted for further evaluation.   Interval history: -Patient seen and examined this morning, resting comfortably in bedside chair. -He is without any chest pain or shortness of breath symptoms.  On room air. -BP and heart rate remain well-controlled. -Renal function and electrolytes stable.  Review of systems complete and found to be negative unless listed above    Past Medical History:  Diagnosis Date   Diabetes mellitus    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 type 2 diabetes mellitus, limited to breakdown of skin (HCC) 04/23/2019   Fracture of one rib, left side, subsequent encounter for fracture with routine healing 08/27/2020   Hyperlipidemia    Hypertension    Normal cardiac stress test 2012   [facility]    Past Surgical History:  Procedure Laterality Date   CORONARY STENT INTERVENTION N/A 06/25/2022   Procedure: CORONARY STENT INTERVENTION;  Surgeon: Alwyn Pea, MD;  Location: [facility] INVASIVE CV LAB;  Service: Cardiovascular;  Laterality: N/A;   CORONARY STENT INTERVENTION N/A 06/26/2022   Procedure: CORONARY STENT INTERVENTION;  Surgeon: Alwyn Pea, MD;  Location: [facility] INVASIVE CV LAB;  Service: Cardiovascular;  Laterality: N/A;   EXOSTECTOMY  Right 06/2019   5th MT head  ([facility])   LEFT HEART CATH AND CORONARY ANGIOGRAPHY N/A 06/25/2022   Procedure: LEFT HEART CATH AND CORONARY ANGIOGRAPHY WITH POSSIBLE STENT;  Surgeon: Alwyn Pea, MD;  Location: [facility] INVASIVE CV LAB;  Service: Cardiovascular;  Laterality: N/A;  12:30 following DC first case   LUMBAR DISC SURGERY      Medications Prior to Admission  Medication Sig Dispense Refill Last Dose/Taking   ASPIRIN LOW DOSE 81 MG EC tablet TAKE 1 TABLET BY MOUTH  DAILY (SWALLOW WHOLE) 90 tablet 1 08/24/2023   empagliflozin (JARDIANCE) 25 MG TABS tablet Take 1 tablet (25 mg total) by mouth daily. 90 tablet 3 08/24/2023   fluticasone (FLONASE) 50 MCG/ACT nasal spray PLACE 2 SPRAYS INTO BOTH NOSTRILS DAILY. AT NIGHT AFTER NASAL SALINE 16 g 2 Taking   gabapentin (NEURONTIN) 100 MG capsule TAKE 2 CAPSULES THREE TIMES DAILY 540 capsule 3 08/24/2023   insulin aspart (NOVOLOG FLEXPEN) 100 UNIT/ML FlexPen Inject up to 25 units with breakfast, 25 units with lunch, and 30 units with supper 72 mL 2 08/25/2023   insulin degludec (TRESIBA FLEXTOUCH) 200 UNIT/ML FlexTouch Pen Inject 88 Units into the skin daily. (Patient taking differently: Inject 86 Units into the skin daily.) 48 mL 2 08/25/2023   ketoconazole (NIZORAL) 2 % cream SMARTSIG:1 Application Topical 1 to 2 Times Daily   Past Week   Multiple Vitamin (MULTIVITAMIN) tablet Take 1 tablet by mouth daily.   08/24/2023   tamsulosin (FLOMAX) 0.4 MG CAPS capsule TAKE 1 CAPSULE EVERY DAY 90 capsule 3 08/24/2023   traMADol (ULTRAM) 50 MG tablet Take  1 tablet (50 mg total) by mouth every 6 (six) hours as needed. 360 tablet 1 Taking As Needed   triamcinolone (KENALOG) 0.025 % cream Apply topically.   08/24/2023   Upadacitinib ER (RINVOQ) 15 MG TB24 Take 15 mg by mouth daily.   08/24/2023   Vitamin D, Ergocalciferol, (DRISDOL) 1.25 MG (50000 UNIT) CAPS capsule Take 50,000 Units by mouth once a week.   Taking   blood glucose meter kit and supplies Dispense based  on patient and insurance preference. Use up to four times daily as directed. (FOR ICD-10 E10.9, E11.9). Use to check blood sugar three times daily 1 each 0    cefadroxil (DURICEF) 500 MG capsule Take 1 capsule (500 mg total) by mouth 2 (two) times daily. (Patient not taking: Reported on 08/26/2023) 14 capsule 0 Not Taking   clobetasol cream (TEMOVATE) 0.05 % Apply topically 2 (two) times daily. (Patient not taking: Reported on 08/26/2023)   Not Taking   clopidogrel (PLAVIX) 75 MG tablet Take 75 mg by mouth daily. (Patient not taking: Reported on 08/26/2023)   Not Taking   Continuous Blood Gluc Sensor (FREESTYLE LIBRE 2 SENSOR) MISC Use to check sugar at least 4 times daily 1 each 2    furosemide (LASIX) 20 MG tablet TAKE 1 TABLET EVERY OTHER DAY (Patient not taking: Reported on 08/26/2023) 45 tablet 3 Not Taking   Insulin Pen Needle 32G X 6 MM MISC Use daily with insulin pen 50 each 0    Lancets (FREESTYLE) lancets 1 each by Other route 2 (two) times daily. DX 250.02 100 each 12    losartan (COZAAR) 100 MG tablet TAKE 1 TABLET EVERY DAY 90 tablet 3    metoprolol tartrate (LOPRESSOR) 50 MG tablet Take 1 tablet (50 mg total) by mouth 2 (two) times daily. (Patient not taking: Reported on 08/26/2023) 180 tablet 1 Not Taking   ondansetron (ZOFRAN-ODT) 4 MG disintegrating tablet Take 1 tablet (4 mg total) by mouth every 8 (eight) hours as needed for nausea or vomiting. (Patient not taking: Reported on 08/26/2023) 20 tablet 0 Not Taking   ONETOUCH VERIO test strip USE TO CHECK BLOOD SUGAR 3  TIMES DAILY 300 strip 3    potassium chloride SA (KLOR-CON M) 20 MEQ tablet Take 2 tablets (40 mEq total) by mouth daily for 3 days. 6 tablet 0    rosuvastatin (CRESTOR) 40 MG tablet Take 1 tablet (40 mg total) by mouth every evening for 360 doses. (Patient not taking: Reported on 08/26/2023) 90 tablet 3 Not Taking   Semaglutide, 2 MG/DOSE, (OZEMPIC, 2 MG/DOSE,) 8 MG/3ML SOPN Inject 2 mg into the skin once a week. (Patient not  taking: Reported on 08/26/2023) 8 mL 2 Not Taking   Social History   Socioeconomic History   Marital status: Married    Spouse name: Not on file   Number of children: Not on file   Years of education: Not on file   Highest education level: 12th grade  Occupational History   Not on file  Tobacco Use   Smoking status: Never   Smokeless tobacco: Never  Substance and Sexual Activity   Alcohol use: No   Drug use: No   Sexual activity: Not on file  Other Topics Concern   Not on file  Social History Narrative   Married   Social Drivers of Health   Financial Resource Strain: Medium Risk (06/30/2023)   Overall Financial Resource Strain (CARDIA)    Difficulty of Paying Living Expenses: Somewhat hard  Food Insecurity: Patient Declined (08/27/2023)   Hunger Vital Sign    Worried About Running Out of Food in the Last Year: Patient declined    Ran Out of Food in the Last Year: Patient declined  Transportation Needs: Patient Declined (08/27/2023)   PRAPARE - Administrator, Civil Service (Medical): Patient declined    Lack of Transportation (Non-Medical): Patient declined  Physical Activity: Insufficiently Active (06/30/2023)   Exercise Vital Sign    Days of Exercise per Week: 2 days    Minutes of Exercise per Session: 10 min  Stress: No Stress Concern Present (06/30/2023)   [employer] of Occupational Health - Occupational Stress Questionnaire    Feeling of Stress : Not at all  Social Connections: Moderately Integrated (06/30/2023)   Social Connection and Isolation Panel [NHANES]    Frequency of Communication with Friends and Family: More than three times a week    Frequency of Social Gatherings with Friends and Family: Twice a week    Attends Religious Services: 1 to 4 times per year    Active Member of [employer] or Organizations: No    Attends Banker Meetings: Never    Marital Status: Married  Catering manager Violence: Patient Declined (08/27/2023)    Humiliation, Afraid, Rape, and Kick questionnaire    Fear of Current or Ex-Partner: Patient declined    Emotionally Abused: Patient declined    Physically Abused: Patient declined    Sexually Abused: Patient declined    Family History  Problem Relation Age of Onset   Coronary artery disease Mother 26   COPD Mother    Heart disease Mother    Hyperlipidemia Mother    Hypertension Mother    Cancer Father    Hyperlipidemia Father    Hypertension Brother    Cancer Brother        lymphoma   Diabetes Maternal Aunt    Diabetes Maternal Uncle    Diabetes Maternal Grandmother    Heart disease Maternal Grandmother      Vitals:   08/31/23 2019 08/31/23 2321 09/01/23 0447 09/01/23 0836  BP: 118/66 122/71 115/71 131/73  Pulse: 96 83 78 (!) 103  Resp: 18 19 16 18   Temp: 98.5 F (36.9 C) 98.6 F (37 C) 98.4 F (36.9 C) 98.8 F (37.1 C)  TempSrc: Oral Oral Oral Oral  SpO2: 96% 95% 95% 95%  Height:        PHYSICAL EXAM General: Well appearing male, well nourished, in no acute distress. HEENT: Normocephalic and atraumatic. Neck: No JVD.  Lungs: Normal respiratory effort on room air. Clear bilaterally to auscultation. No wheezes, crackles, rhonchi.  Heart: HRRR, fast rate. Normal S1 and S2 without gallops or murmurs.  Abdomen: Non-distended appearing.  Msk: Normal strength and tone for age. Extremities: Warm and well perfused. No clubbing, cyanosis. No edema.  Neuro: Alert and oriented X 3. Psych: Answers questions appropriately.   Labs: Basic Metabolic Panel: Recent Labs    08/30/23 0428 08/31/23 0312 09/01/23 0331  NA 134* 135 136  K 3.5 4.3 3.9  CL 101 99 99  CO2 24 24 22   GLUCOSE 220* 169* 193*  BUN 19 20 18   CREATININE 0.65 0.77 0.69  CALCIUM 8.2* 8.7* 8.5*  MG 2.3  --   --   PHOS 3.0  --   --    Liver Function Tests: No results for input(s): "AST", "ALT", "ALKPHOS", "BILITOT", "PROT", "ALBUMIN" in the last 72 hours.  No results for input(s): "LIPASE",  "  AMYLASE" in the last 72 hours.  CBC: Recent Labs    08/31/23 0312 09/01/23 0331  WBC 13.5* 12.7*  HGB 13.7 14.1  HCT 40.7 41.1  MCV 90.8 89.5  PLT 293 322   Cardiac Enzymes: No results for input(s): "CKTOTAL", "CKMB", "CKMBINDEX", "TROPONINIHS" in the last 72 hours.  BNP: No results for input(s): "BNP" in the last 72 hours. D-Dimer: No results for input(s): "DDIMER" in the last 72 hours. Hemoglobin A1C: No results for input(s): "HGBA1C" in the last 72 hours. Fasting Lipid Panel: No results for input(s): "CHOL", "HDL", "LDLCALC", "TRIG", "CHOLHDL", "LDLDIRECT" in the last 72 hours.  Thyroid Function Tests: No results for input(s): "TSH", "T4TOTAL", "T3FREE", "THYROIDAB" in the last 72 hours.  Invalid input(s): "FREET3" Anemia Panel: No results for input(s): "VITAMINB12", "FOLATE", "FERRITIN", "TIBC", "IRON", "RETICCTPCT" in the last 72 hours.   Radiology: CT ANGIO HEAD NECK W WO CM Result Date: 08/31/2023 CLINICAL DATA:  Neuro deficit, acute, stroke suspected multifocal embolic strokes - septic emboli? EXAM: CT ANGIOGRAPHY HEAD AND NECK WITH AND WITHOUT CONTRAST TECHNIQUE: Multidetector CT imaging of the head and neck was performed using the standard protocol during bolus administration of intravenous contrast. Multiplanar CT image reconstructions and MIPs were obtained to evaluate the vascular anatomy. Carotid stenosis measurements (when applicable) are obtained utilizing NASCET criteria, using the distal internal carotid diameter as the denominator. RADIATION DOSE REDUCTION: This exam was performed according to the departmental dose-optimization program which includes automated exposure control, adjustment of the mA and/or kV according to patient size and/or use of iterative reconstruction technique. CONTRAST:  75mL OMNIPAQUE IOHEXOL 350 MG/ML SOLN COMPARISON:  MRI head August 29, 2023. FINDINGS: CT HEAD FINDINGS Brain: Numerous small infarcts better characterized on recent MRI.  No progressive mass effect or acute hemorrhage. No mass lesion, midline shift or hydrocephalus. Vascular: See below. Skull: No acute fracture. Sinuses/Orbits: Clear sinuses.  No acute orbital findings. Other: No mastoid effusions. Review of the MIP images confirms the above findings CTA NECK FINDINGS Aortic arch: Great vessel origins are patent. No significant stenosis. Right carotid system: No evidence of dissection, stenosis (50% or greater), or occlusion. Left carotid system: No evidence of dissection, stenosis (50% or greater), or occlusion. Vertebral arteries: Right dominant. No evidence of dissection, stenosis (50% or greater), or occlusion. Skeleton: No acute fracture on limited assessment. Other neck: No acute abnormality on limited assessment. Upper chest: Multifocal nodular opacities in the right upper lobe. Review of the MIP images confirms the above findings CTA HEAD FINDINGS Anterior circulation: Bilateral intracranial ICAs, MCAs, and ACAs are patent without proximal hemodynamically significant stenosis. No aneurysm identified. Posterior circulation: Bilateral intradural vertebral arteries, basilar artery and bilateral posterior shoulders are patent without proximal hemodynamically significant stenosis. No aneurysm identified. Venous sinuses: As permitted by contrast timing, patent. Review of the MIP images confirms the above findings IMPRESSION: 1. No large vessel occlusion or proximal hemodynamically significant stenosis. 2. Multifocal nodular opacities in the right upper lobe, partially imaged and possibly representing septic emboli given the clinical history. Recommend CT of the chest (preferably with contrast) for further evaluation. Electronically Signed   By: Feliberto Harts M.D.   On: 08/31/2023 19:47   MR BRAIN W WO CONTRAST Result Date: 08/29/2023 CLINICAL DATA:  Mental status change, unknown cause EXAM: MRI HEAD WITHOUT AND WITH CONTRAST TECHNIQUE: Multiplanar, multiecho pulse sequences  of the brain and surrounding structures were obtained without and with intravenous contrast. CONTRAST:  10mL GADAVIST GADOBUTROL 1 MMOL/ML IV SOLN COMPARISON:  CT head  August 25, 2023. 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 Mild associated edema without mass effect. No midline shift. No acute hemorrhage, mass lesion or hydrocephalus. Small remote lacunar infarcts in the thalami bilaterally. No pathologic enhancement. Vascular: Major arterial flow voids are maintained at the skull base. Skull and upper cervical spine: Normal marrow signal. Sinuses/Orbits: Clear sinuses.  No acute orbital findings. Other: No mastoid effusions. 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 Given involvement of multiple vascular territories, consider an embolic etiology. No mass effect. These results will be called to the ordering clinician or representative by the Radiologist Assistant, and communication documented in the PACS or [employer]. Electronically Signed   By: Feliberto Harts M.D.   On: 08/29/2023 21:36   ECHOCARDIOGRAM COMPLETE Result Date: 08/27/2023    ECHOCARDIOGRAM REPORT   Patient Name:   Connor Ross [facility] Date of Exam: 08/27/2023 Medical Rec #:  [id number]             Height:       71.0 in Accession #:    [id number]            Weight:       290.2 lb Date of Birth:  August 26, 1965             BSA:          2.470 m Patient Age:    57 years              BP:           129/71 mmHg Patient Gender: M                     HR:           84 bpm. Exam Location:  [facility] Procedure: 2D Echo, Cardiac Doppler, Color Doppler and Intracardiac            Opacification Agent (Both Spectral and Color Flow Doppler were            utilized during procedure). Indications:     NSTEMI  History:         Patient has prior history of Echocardiogram examinations, most                  recent  06/24/2022. Acute MI and Previous Myocardial Infarction;                  Risk Factors:Hypertension, Diabetes and Sleep Apnea. Infulenza                  +.  Sonographer:     Mikki Harbor Referring Phys:  [id number] JAN A MANSY Diagnosing Phys: Alwyn Pea MD  Sonographer Comments: Technically difficult study due to poor echo windows and patient is obese. IMPRESSIONS  1. Left ventricular ejection fraction, by estimation, is 65 to 70%. The left ventricle has normal function. The left ventricle has no regional wall motion abnormalities. There is moderate concentric left ventricular hypertrophy. Left ventricular diastolic parameters are consistent with Grade II diastolic dysfunction (pseudonormalization).  2. Right ventricular systolic function is normal. The right ventricular size is normal.  3. The mitral valve is normal in structure. Trivial mitral valve regurgitation.  4. The aortic valve is normal in structure. Aortic valve regurgitation is not visualized. FINDINGS  Left Ventricle: Left ventricular ejection fraction, by estimation, is 65 to 70%. The left ventricle has normal  function. The left ventricle has no regional wall motion abnormalities. Definity contrast agent was given IV to delineate the left ventricular  endocardial borders. Strain imaging was not performed. The left ventricular internal cavity size was normal in size. There is moderate concentric left ventricular hypertrophy. Left ventricular diastolic parameters are consistent with Grade II diastolic dysfunction (pseudonormalization). Right Ventricle: The right ventricular size is normal. No increase in right ventricular wall thickness. Right ventricular systolic function is normal. Left Atrium: Left atrial size was normal in size. Right Atrium: Right atrial size was normal in size. Pericardium: There is no evidence of pericardial effusion. Mitral Valve: The mitral valve is normal in structure. There is moderate thickening of the mitral valve  leaflet(s). There is moderate calcification of the mitral valve leaflet(s). Normal mobility of the mitral valve leaflets. Trivial mitral valve regurgitation. MV peak gradient, 8.3 mmHg. The mean mitral valve gradient is 4.0 mmHg. Tricuspid Valve: The tricuspid valve is normal in structure. Tricuspid valve regurgitation is trivial. Aortic Valve: The aortic valve is normal in structure. Aortic valve regurgitation is not visualized. Aortic valve mean gradient measures 6.0 mmHg. Aortic valve peak gradient measures 13.0 mmHg. Aortic valve area, by VTI measures 2.53 cm. Pulmonic Valve: The pulmonic valve was normal in structure. Pulmonic valve regurgitation is not visualized. Aorta: The ascending aorta was not well visualized. IAS/Shunts: No atrial level shunt detected by color flow Doppler. Additional Comments: 3D imaging was not performed.  LEFT VENTRICLE PLAX 2D LVIDd:         4.90 cm   Diastology LVIDs:         2.90 cm   LV e' medial:    5.77 cm/s LV PW:         1.50 cm   LV E/e' medial:  22.9 LV IVS:        1.40 cm   LV e' lateral:   5.98 cm/s LVOT diam:     2.10 cm   LV E/e' lateral: 22.1 LV SV:         82 LV SV Index:   33 LVOT Area:     3.46 cm  LEFT ATRIUM              Index LA diam:        4.30 cm  1.74 cm/m LA Vol (A2C):   109.0 ml 44.13 ml/m LA Vol (A4C):   91.6 ml  37.09 ml/m LA Biplane Vol: 102.0 ml 41.30 ml/m  AORTIC VALVE                     PULMONIC VALVE AV Area (Vmax):    2.16 cm      PV Vmax:       1.01 m/s AV Area (Vmean):   2.39 cm      PV Peak grad:  4.1 mmHg AV Area (VTI):     2.53 cm AV Vmax:           180.00 cm/s AV Vmean:          113.000 cm/s AV VTI:            0.326 m AV Peak Grad:      13.0 mmHg AV Mean Grad:      6.0 mmHg LVOT Vmax:         112.00 cm/s LVOT Vmean:        78.100 cm/s LVOT VTI:          0.238 m LVOT/AV VTI ratio: 0.73  AORTA Ao Root diam: 4.10 cm MITRAL VALVE MV Area (PHT): 3.50 cm     SHUNTS MV Area VTI:   2.37 cm     Systemic VTI:  0.24 m MV Peak grad:  8.3 mmHg      Systemic Diam: 2.10 cm MV Mean grad:  4.0 mmHg MV Vmax:       1.44 m/s MV Vmean:      94.0 cm/s MV Decel Time: 217 msec MV E velocity: 132.00 cm/s MV A velocity: 132.00 cm/s MV E/A ratio:  1.00 Alwyn Pea MD Electronically signed by Alwyn Pea MD Signature Date/Time: 08/27/2023/7:55:21 PM    Final    DG Hand Complete Left Result Date: 08/26/2023 CLINICAL DATA:  Left hand swelling. EXAM: LEFT HAND - COMPLETE 3+ VIEW COMPARISON:  None Available. FINDINGS: No fracture or bone lesion. Joints are normally spaced and aligned. Soft tissue swelling, most evident over the dorsal aspect of the hand and wrist. No soft tissue air. No radiopaque foreign body. IMPRESSION: 1. No fracture or acute finding. 2. Nonspecific soft tissue swelling. Electronically Signed   By: Amie Portland M.D.   On: 08/26/2023 15:26   CT ABDOMEN PELVIS W CONTRAST Result Date: 08/25/2023 CLINICAL DATA:  Fever, nausea/vomiting/diarrhea, abdominal pain, polyuria EXAM: CT ABDOMEN AND PELVIS WITH CONTRAST TECHNIQUE: Multidetector CT imaging of the abdomen and pelvis was performed using the standard protocol following bolus administration of intravenous contrast. RADIATION DOSE REDUCTION: This exam was performed according to the departmental dose-optimization program which includes automated exposure control, adjustment of the mA and/or kV according to patient size and/or use of iterative reconstruction technique. CONTRAST:  OMNIPAQUE IOHEXOL 350 MG/ML SOLN COMPARISON:  04/03/2012 FINDINGS: Lower chest: There is minimal subpleural right lower lobe airspace disease, which could reflect focal pneumonia. No effusion. Hepatobiliary: No focal liver abnormality is seen. No gallstones, gallbladder wall thickening, or biliary dilatation. Pancreas: Unremarkable. No pancreatic ductal dilatation or surrounding inflammatory changes. Spleen: Normal in size without focal abnormality. Adrenals/Urinary Tract: The kidneys enhance normally and  symmetrically. No urinary tract calculi or obstructive uropathy within either kidney. Bladder is minimally distended, with no filling defects. The adrenals are normal. Stomach/Bowel: No bowel obstruction or ileus. Normal appendix right lower quadrant. Scattered diverticulosis of the sigmoid colon without diverticulitis. No bowel wall thickening or inflammatory change. Vascular/Lymphatic: Aortic atherosclerosis. No enlarged abdominal or pelvic lymph nodes. Reproductive: Prostate is unremarkable. Other: No free fluid or free intraperitoneal gas. No abdominal wall hernia. Musculoskeletal: No acute or destructive bony abnormalities. Bilateral hip osteoarthritis, left greater than right. Reconstructed images demonstrate no additional findings. IMPRESSION: 1. Patchy subpleural right lower lobe airspace disease, consistent with focal bronchopneumonia. 2. No acute intra-abdominal or intrapelvic process. 3.  Aortic Atherosclerosis (ICD10-I70.0). Electronically Signed   By: Sharlet Salina M.D.   On: 08/25/2023 23:08   DG Chest Port 1 View Result Date: 08/25/2023 CLINICAL DATA:  Sepsis, abdominal pain, polyuria, diabetes EXAM: PORTABLE CHEST 1 VIEW COMPARISON:  06/23/2022 FINDINGS: Single frontal view of the chest demonstrates an enlarged cardiac silhouette. Lung volumes are diminished, with crowding of the central vasculature. No effusion or pneumothorax. No acute bony abnormalities. IMPRESSION: 1. Low lung volumes, with crowding of the pulmonary vasculature. 2. Enlarged cardiac silhouette. Electronically Signed   By: Sharlet Salina M.D.   On: 08/25/2023 22:45   CT Head Wo Contrast Result Date: 08/25/2023 CLINICAL DATA:  Fever. Mental status change of unknown cause. Diabetic. EXAM: CT HEAD WITHOUT CONTRAST TECHNIQUE: Contiguous axial images were obtained  from the base of the skull through the vertex without intravenous contrast. RADIATION DOSE REDUCTION: This exam was performed according to the departmental  dose-optimization program which includes automated exposure control, adjustment of the mA and/or kV according to patient size and/or use of iterative reconstruction technique. COMPARISON:  None Available. FINDINGS: Brain: The study suffers from motion degradation. With repeat imaging, a satisfactory exam is achieved. No sign of old or acute focal infarction, mass lesion, hemorrhage, hydrocephalus or extra-axial collection. Vascular: There is atherosclerotic calcification of the major vessels at the base of the brain. Skull: Negative Sinuses/Orbits: Apparent soft tissue swelling of the right posterior parietal scalp, presumed hematoma. Orbits negative. Other: None IMPRESSION: No acute intracranial finding. Apparent soft tissue swelling of the right posterior parietal scalp, presumed hematoma. Electronically Signed   By: Paulina Fusi M.D.   On: 08/25/2023 22:39    ECHO as above  TELEMETRY reviewed by me 09/01/2023: sinus tachycardia rate 100s  EKG reviewed by me: sinus tachycardia rate 101 bpm  Data reviewed by me 09/01/2023: last 24h vitals tele labs imaging I/O hospitalist progress note  Principal Problem:   NSTEMI (non-ST elevated myocardial infarction) (HCC) Active Problems:   Sepsis due to pneumonia (HCC)   Hypokalemia   Metabolic acidosis   Type 2 diabetes mellitus with peripheral neuropathy (HCC)   Essential hypertension   BPH (benign prostatic hyperplasia)   Influenza A   Hypocalcemia   Septicemia due to group D Streptococcus (HCC)   Bacteremia due to group B Streptococcus   Sepsis due to group B Streptococcus (HCC)   Encephalopathy    ASSESSMENT AND PLAN:  Connor Ross is a 58 y.o. male  with a past medical history of non-STEMI coronary disease (s/p previous stents to prox and mid LAD in remote past, s/p PCI and stent to distal RCA and overlapping prox RCA 06/2022) diabetes, hypertension, obesity, hyperlipidemia who presented to the ED on 08/25/2023 for fever, vomiting,  diarrhea. Troponins checked and found to be elevated. Cardiology was consulted for further evaluation.   # Acute CVA He had an episode on Friday of confusion and delirium.  MRI brain at that time revealed numerous small acute infarcts.  Concern for cardioembolic source. -Will plan for TEE tomorrow for evaluation of endocarditis given bacteremia and acute CVA's.  Patient understands process of procedure, risk/benefits and is amenable to proceeding.  N.p.o. after midnight.  # NSTEMI # Coronary artery disease # Hypertension Patient with history of coronary disease with prior stenting in 06/2022 found to have elevated troponins upon admission.  No reports of chest pain symptoms.  Troponins trended 89 > 272 > 576 > 607 > 1071 > 1226 > 817.  Echo this admission with normal EF, no wall motion abnormalities, moderate LVH, grade 2 diastolic dysfunction. -Completed 48-72 hours heparin infusion. -S/p ASA 325 mg in the ED.  Continue rosuvastatin 40 mg daily, aspirin 81 mg daily, Plavix 75 mg daily. -Continue losartan 100 mg daily, metoprolol tartrate 50 mg twice daily. -No plan for further cardiac diagnostics at this time given strep bacteremia.  Can consider cath in future for evaluation after infection has been treated appropriately.  # Influenza A infection # Pneumonia # Strep agalactiae bacteremia Patient presented to the ED with fever/chills, vomiting, diarrhea.  Reports feeling poorly for roughly 1 week.  Flu a positive in the ED.  CTA abdomen concerning for right lower lobe pneumonia.  Blood culture positive for strep agalactiae -Further management per primary.   This patient's plan  of care was discussed and created with Dr. Corky Sing and he is in agreement.  Signed: Gale Journey, PA-C  09/01/2023, 10:09 AM [facility]

## 2023-09-01 NOTE — Progress Notes (Addendum)
 Date of Admission:  08/25/2023      ID: Connor Ross is a 58 y.o. male  Principal Problem:   NSTEMI (non-ST elevated myocardial infarction) (HCC) Active Problems:   Sepsis due to pneumonia (HCC)   Hypokalemia   Metabolic acidosis   Type 2 diabetes mellitus with peripheral neuropathy (HCC)   Essential hypertension   BPH (benign prostatic hyperplasia)   Influenza A   Hypocalcemia   Septicemia due to group D Streptococcus (HCC)   Bacteremia due to group B Streptococcus   Sepsis due to group B Streptococcus (HCC)   Encephalopathy    Subjective: Pt is feeling better Wife at bed side Left hand pain and swelling much improved Minimal stiffness  Medications:   aspirin EC  81 mg Oral Daily   calcium-vitamin D  2 tablet Oral TID   clopidogrel  75 mg Oral Daily   empagliflozin  25 mg Oral Daily   [START ON 09/02/2023] enoxaparin (LOVENOX) injection  40 mg Subcutaneous QPM   furosemide  20 mg Oral QODAY   gabapentin  100 mg Oral TID   guaiFENesin  600 mg Oral BID   losartan  100 mg Oral Daily   metoprolol tartrate  50 mg Oral BID   multivitamin with minerals  1 tablet Oral Daily   pantoprazole  40 mg Oral Daily   rosuvastatin  40 mg Oral QPM   tamsulosin  0.4 mg Oral Daily   Vitamin D (Ergocalciferol)  50,000 Units Oral Weekly    Objective: Vital signs in last 24 hours: Patient Vitals for the past 24 hrs:  BP Temp Temp src Pulse Resp SpO2  09/01/23 1601 113/67 98.2 F (36.8 C) Oral 87 18 96 %  09/01/23 1218 113/65 98.3 F (36.8 C) Oral 92 18 95 %  09/01/23 0836 131/73 98.8 F (37.1 C) Oral (!) 103 18 95 %  09/01/23 0447 115/71 98.4 F (36.9 C) Oral 78 16 95 %  08/31/23 2321 122/71 98.6 F (37 C) Oral 83 19 95 %  08/31/23 2019 118/66 98.5 F (36.9 C) Oral 96 18 96 %      PHYSICAL EXAM:  General: Alert, cooperative, no distress, appears stated age. Still has some memory deficit Head: Normocephalic, without obvious abnormality, atraumatic. Eyes:  Conjunctivae clear, anicteric sclerae. Pupils are equal ENT Nares normal. No drainage or sinus tenderness. Lips, mucosa, and tongue normal. No Thrush Neck: Supple, symmetrical, no adenopathy, thyroid: non tender no carotid bruit and no JVD. Back: No CVA tenderness. Lungs: Clear to auscultation bilaterally. No Wheezing or Rhonchi. No rales. Heart: Regular rate and rhythm, no murmur, rub or gallop. Abdomen: Soft, non-tender,not distended. Bowel sounds normal. No masses Extremities:left hand dorsum near normal  2/Skin: No rashes or lesions. Or bruising Lymph: Cervical, supraclavicular normal. Neurologic: Grossly non-focal  Lab Results    Latest Ref Rng & Units 09/01/2023    3:31 AM 08/31/2023    3:12 AM 08/30/2023    4:28 AM  CBC  WBC 4.0 - 10.5 K/uL 12.7  13.5  10.4   Hemoglobin 13.0 - 17.0 g/dL 19.1  47.8  29.5   Hematocrit 39.0 - 52.0 % 41.1  40.7  37.6   Platelets 150 - 400 K/uL 322  293  231        Latest Ref Rng & Units 09/01/2023    3:31 AM 08/31/2023    3:12 AM 08/30/2023    4:28 AM  CMP  Glucose 70 - 99 mg/dL 621  308  220   BUN 6 - 20 mg/dL 18  20  19    Creatinine 0.61 - 1.24 mg/dL 3.24  4.01  0.27   Sodium 135 - 145 mmol/L 136  135  134   Potassium 3.5 - 5.1 mmol/L 3.9  4.3  3.5   Chloride 98 - 111 mmol/L 99  99  101   CO2 22 - 32 mmol/L 22  24  24    Calcium 8.9 - 10.3 mg/dL 8.5  8.7  8.2       Microbiology: 08/25/23 4/4 GBS - but taken from the same site 08/27/23 [facility] - NG  Studies/Results:   CT ANGIO HEAD NECK W WO CM Result Date: 08/31/2023 CLINICAL DATA:  Neuro deficit, acute, stroke suspected multifocal embolic strokes - septic emboli? EXAM: CT ANGIOGRAPHY HEAD AND NECK WITH AND WITHOUT CONTRAST TECHNIQUE: Multidetector CT imaging of the head and neck was performed using the standard protocol during bolus administration of intravenous contrast. Multiplanar CT image reconstructions and MIPs were obtained to evaluate the vascular anatomy. Carotid stenosis  measurements (when applicable) are obtained utilizing NASCET criteria, using the distal internal carotid diameter as the denominator. RADIATION DOSE REDUCTION: This exam was performed according to the departmental dose-optimization program which includes automated exposure control, adjustment of the mA and/or kV according to patient size and/or use of iterative reconstruction technique. CONTRAST:  75mL OMNIPAQUE IOHEXOL 350 MG/ML SOLN COMPARISON:  MRI head August 29, 2023. FINDINGS: CT HEAD FINDINGS Brain: Numerous small infarcts better characterized on recent MRI. No progressive mass effect or acute hemorrhage. No mass lesion, midline shift or hydrocephalus. Vascular: See below. Skull: No acute fracture. Sinuses/Orbits: Clear sinuses.  No acute orbital findings. Other: No mastoid effusions. Review of the MIP images confirms the above findings CTA NECK FINDINGS Aortic arch: Great vessel origins are patent. No significant stenosis. Right carotid system: No evidence of dissection, stenosis (50% or greater), or occlusion. Left carotid system: No evidence of dissection, stenosis (50% or greater), or occlusion. Vertebral arteries: Right dominant. No evidence of dissection, stenosis (50% or greater), or occlusion. Skeleton: No acute fracture on limited assessment. Other neck: No acute abnormality on limited assessment. Upper chest: Multifocal nodular opacities in the right upper lobe. Review of the MIP images confirms the above findings CTA HEAD FINDINGS Anterior circulation: Bilateral intracranial ICAs, MCAs, and ACAs are patent without proximal hemodynamically significant stenosis. No aneurysm identified. Posterior circulation: Bilateral intradural vertebral arteries, basilar artery and bilateral posterior shoulders are patent without proximal hemodynamically significant stenosis. No aneurysm identified. Venous sinuses: As permitted by contrast timing, patent. Review of the MIP images confirms the above findings  IMPRESSION: 1. No large vessel occlusion or proximal hemodynamically significant stenosis. 2. Multifocal nodular opacities in the right upper lobe, partially imaged and possibly representing septic emboli given the clinical history. Recommend CT of the chest (preferably with contrast) for further evaluation. Electronically Signed   By: Feliberto Harts M.D.   On: 08/31/2023 19:47   Assessment/Plan:   58 year old male presenting with confusion, vomiting and diarrhea and pain left hand and fever   Influenza a viral illness   Streptococcus group B  bacteremia  Left hand cellulitis The source of the cellulitis is likely from the fingersticks he does for diabetic checkup on ceftriaxone day 7  Repeat BC neg Left hand cellulitis. Much improved 2d echo shows mitral valve leaflet thickening GBS is not a common bacteria to cause endocarditis  But he has b/l small infarcts in the brain concerning for embolic infarcts  and hence TEE needed to r/o valve vegetation and LV thrombus TEE tomorrow Will need 4-6 weeks of Iv antibiotic   Encephalopathy - improved Some memory gaps persist -  MRI brain with contrast done on 08/29/23 showed b/l infarcts    Diabetes mellitus   NSTEMI on admission Increasing troponin this admission.  Seen by cardiology.was on Iv heparin- now DC CAD status post stents   Discussed the management with the patient ,and his wife

## 2023-09-01 NOTE — Progress Notes (Signed)
 Physical Therapy Treatment Patient Details Name: Connor Ross MRN: [id number] DOB: [birth year] Today's Date: 09/01/2023   History of Present Illness Pt is a 58 y.o. male presenting to hospital 08/25/23 with c/o fever and AMS; also N/V/D.  PMH includes DM, htn, diabetic ulcer L toe, lumbar disc surgery.  Pt admitted with NSTEMI, sepsis d/t PNA, (+) influenza A, hypokalemia, metabolic acidosis, L hand cellulitis.    PT Comments  Pt resting in recliner upon PT arrival; pt agreeable to therapy; pt's wife present.  Mild R shoulder/upper arm soreness reported during session--nurse notified.  During session pt SBA with transfer from recliner and CGA to ambulate 200 feet with RW use.  Pt demonstrating steady gait with RW use; pt appearing to need UE support during ambulation and requiring cueing for upright posture at times.  Pt reporting walking felt good.  HR 83-92 bpm during sessions activities.  Will continue to focus on strengthening, balance, and progressive functional mobility during hospitalization.   If plan is discharge home, recommend the following: A little help with walking and/or transfers;A little help with bathing/dressing/bathroom;Assistance with cooking/housework;Assist for transportation;Help with stairs or ramp for entrance   Can travel by private vehicle     Yes  Equipment Recommendations  Rolling walker (2 wheels);BSC/3in1    Recommendations for Other Services       Precautions / Restrictions Precautions Precautions: Fall Recall of Precautions/Restrictions: Impaired Restrictions Weight Bearing Restrictions Per Provider Order: No     Mobility  Bed Mobility               General bed mobility comments: Deferred (pt sitting in recliner beginning/end of session)    Transfers Overall transfer level: Needs assistance Equipment used: Rolling walker (2 wheels) Transfers: Sit to/from Stand Sit to Stand: Supervision           General transfer comment: pt  has his feet crossed when standing (his typical standing pattern); steady standing from recliner    Ambulation/Gait Ambulation/Gait assistance: Contact guard assist Gait Distance (Feet): 200 Feet Assistive device: Rolling walker (2 wheels) Gait Pattern/deviations: Step-through pattern, Decreased step length - right, Decreased step length - left Gait velocity: decreased     General Gait Details: increased L lateral sway (and L LE stance time) but no loss of balance noted   Stairs             Wheelchair Mobility     Tilt Bed    Modified Rankin (Stroke Patients Only)       Balance Overall balance assessment: Needs assistance Sitting-balance support: No upper extremity supported, Feet supported Sitting balance-Leahy Scale: Good Sitting balance - Comments: steady reaching within BOS   Standing balance support: Bilateral upper extremity supported, During functional activity, Reliant on assistive device for balance Standing balance-Leahy Scale: Good Standing balance comment: steady ambulating with RW use                            Communication Communication Communication: No apparent difficulties  Cognition Arousal: Alert Behavior During Therapy: WFL for tasks assessed/performed                           PT - Cognition Comments: A&O to person, place, general situation, and month/year (pt reported it was the 16th or 17th); pt demonstrating some confusion and forgetfullness during session (pt still reports having difficulty remembering things) Following commands: Impaired Following commands impaired: Follows multi-step  commands with increased time    Cueing Cueing Techniques: Verbal cues  Exercises      General Comments  Nursing cleared pt for participation in physical therapy.  Pt agreeable to PT session.      Pertinent Vitals/Pain Pain Assessment Pain Assessment: Faces Faces Pain Scale: Hurts little more Pain Location: R shoulder/upper  arm Pain Descriptors / Indicators: Sore Pain Intervention(s): Limited activity within patient's tolerance, Monitored during session SpO2 sats 95% or greater on room air pre/post ambulation.    Home Living                          Prior Function            PT Goals (current goals can now be found in the care plan section) Acute Rehab PT Goals Patient Stated Goal: to improve strength and walking PT Goal Formulation: With patient Time For Goal Achievement: 09/11/23 Potential to Achieve Goals: Good Progress towards PT goals: Progressing toward goals    Frequency    Min 1X/week      PT Plan      Co-evaluation              AM-PAC PT "6 Clicks" Mobility   Outcome Measure  Help needed turning from your back to your side while in a flat bed without using bedrails?: None Help needed moving from lying on your back to sitting on the side of a flat bed without using bedrails?: A Little Help needed moving to and from a bed to a chair (including a wheelchair)?: A Little Help needed standing up from a chair using your arms (e.g., wheelchair or bedside chair)?: A Little Help needed to walk in hospital room?: A Little Help needed climbing 3-5 steps with a railing? : A Little 6 Click Score: 19    End of Session Equipment Utilized During Treatment: Gait belt Activity Tolerance: Patient tolerated treatment well Patient left: in chair;with call bell/phone within reach;with chair alarm set;with family/visitor present Nurse Communication: Mobility status;Precautions PT Visit Diagnosis: Unsteadiness on feet (R26.81);Other abnormalities of gait and mobility (R26.89);Muscle weakness (generalized) (M62.81);History of falling (Z91.81);Pain Pain - Right/Left: Right Pain - part of body: Shoulder;Arm     Time: 1610-9604 PT Time Calculation (min) (ACUTE ONLY): 26 min  Charges:    $Gait Training: 8-22 mins $Therapeutic Activity: 8-22 mins PT General Charges $$ ACUTE PT  VISIT: 1 Visit                     Hendricks Limes, PT 09/01/23, 3:23 PM

## 2023-09-01 NOTE — Progress Notes (Signed)
 Progress Note   Patient: Connor Ross DOB: 09-Mar-1966 DOA: 08/25/2023     6 DOS: the patient was seen and examined on 09/01/2023   Brief hospital course: Connor Ross is a 58 y.o. male with medical history significant for type diabetes mellitus, hypertension and dyslipidemia, who presented to the emergency room with acute onset of fever with altered mental status  Patient was found to have tachycardia, tachypnea.  No leukocytosis.  Influenza was positive, blood culture was positive in 1 bottle with Streptococcus.  She was placed on Rocephin.  Patient also had elevated troponin peaked at 1226, seen by cardiology and placed on heparin drip.   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 after midnight, TEE tomorrow a.m.   # NSTEMI (non-ST elevated myocardial infarction) (HCC) Elevated troponin in the setting of influenza and septicemia.   Troponin peaked 1226, trended down, most likely has non-STEMI.  Cardiology consulted, cont Hep gtt for now, will need cath after improvement, can not do it due to bacteremia 2/14 discontinued heparin IV infusion after 48  hours  # Acute hypoxic respiratory failure secondary to pneumonia, resolved # Sepsis due to pneumonia  # Influenza A + # Bacteremia, GB Strep agalactiae   # Left hand cellulitis. Patient did meet sepsis criteria at time of admission with tachycardia and tachypnea.   Chest x-ray and CT scan also showed left lower lobe airspace disease consistent with pneumonia. Patient blood culture positive for Streptococcus agalactia.  The urine culture performed on 09/2022 also positive for Streptococcus agalactia.  Patient also has left hand swelling and red, states that he might had a spider bite. x-ray negative for Fx. He has multiple reasons for bacteremia, but exact etiology is unclear.  ID consulted, recommended TEE due to suspected embolic stroke and pulmonary emboli Continue Rocephin for now. Patient is symptom of the flu started last Wednesday, out of the window for Tamiflu. 2/12 repeat Bld Cx NGTD 2/13 started pantoprazole 40 mg p.o. daily  # Hypokalemia, K repleted  # Hypophosphatemia, Phos repleted # Metabolic acidosis. Resolved, s/p bicarb drip. Monitored electrolytes daily  # BPH (benign prostatic hyperplasia) - resumed Flomax.  # Essential hypertension - continue antihypertensive therapy.  # Type 2 diabetes mellitus with peripheral neuropathy - The patient will be placed on supplemental coverage with NovoLog. - continue basal coverage. - continue Neurontin.  # Morbid obesity with BMI 40.47.   Diet and excise advised.  # Debility. Patient was severely debilitated at home, largely due to joint arthritis.  Obtain PT/OT.   Subjective:  No significant events overnight.  Patient was sitting up in the recliner, stated that he had a 1 episode of BM which was soft and loose but  not diarrhea.  Denies any abdominal pain, no nausea vomiting.  Denied any neurological findings. Denied any chest pain or palpitations, no shortness of breath.   Physical Exam: Vitals:   08/31/23 2321  09/01/23 0447 09/01/23 0836 09/01/23 1218  BP: 122/71 115/71 131/73 113/65  Pulse: 83 78 (!) 103 92  Resp: 19 16 18 18   Temp: 98.6 F (37 C) 98.4 F (36.9 C) 98.8 F (37.1 C) 98.3 F (36.8 C)  TempSrc: Oral Oral Oral Oral  SpO2: 95% 95% 95% 95%  Height:       General exam: Appears calm and comfortable, morbid obese. Respiratory system: Mild b/l crackles, no wheezes, no resp distress, normal work of breathing  Cardiovascular system: S1 & S2 heard, RRR. No JVD, murmurs, rubs, gallops or clicks. No pedal edema. Gastrointestinal system: Abdomen is nondistended, soft and nontender. No organomegaly or masses felt. Normal bowel sounds heard. Central nervous system: Alert and oriented. No focal neurological deficits. Extremities: Left Hand erythema almost resolved, mild swelling is still there  Skin: No rashes, lesions or ulcers Psychiatry: Judgement and insight appear normal. Mood & affect appropriate.    Data Reviewed:  Reviewed CT scan results, chest x-ray results   Family Communication: Wife updated over the phone on 2/17  Disposition: Status is: Inpatient Remains inpatient appropriate because: Severity of disease, IV treatment.  DVT Prophylaxis: Lovenox   Time spent: 45 min Author: Gillis Santa, MD 09/01/2023 2:02 PM  For on call review [email or web address].

## 2023-09-01 NOTE — Plan of Care (Signed)

## 2023-09-02 ENCOUNTER — Inpatient Hospital Stay: Payer: Medicare HMO | Admitting: Certified Registered"

## 2023-09-02 ENCOUNTER — Encounter
Admission: EM | Disposition: A | Discharge status: Other Acute Inpatient Hospital | Payer: Self-pay | Source: Home / Self Care | Attending: Student

## 2023-09-02 ENCOUNTER — Inpatient Hospital Stay
Admit: 2023-09-02 | Discharge: 2023-09-02 | Disposition: A | Discharge status: Home / Self Care | Payer: Medicare HMO | Attending: Cardiovascular Disease | Admitting: Cardiovascular Disease

## 2023-09-02 DIAGNOSIS — Z7689 Persons encountering health services in other specified circumstances: Secondary | ICD-10-CM | POA: Diagnosis not present

## 2023-09-02 DIAGNOSIS — E1169 Type 2 diabetes mellitus with other specified complication: Secondary | ICD-10-CM | POA: Diagnosis not present

## 2023-09-02 DIAGNOSIS — I330 Acute and subacute infective endocarditis: Secondary | ICD-10-CM | POA: Diagnosis not present

## 2023-09-02 DIAGNOSIS — Z794 Long term (current) use of insulin: Secondary | ICD-10-CM | POA: Diagnosis not present

## 2023-09-02 DIAGNOSIS — Z452 Encounter for adjustment and management of vascular access device: Secondary | ICD-10-CM | POA: Diagnosis not present

## 2023-09-02 DIAGNOSIS — I6340 Cerebral infarction due to embolism of unspecified cerebral artery: Secondary | ICD-10-CM | POA: Diagnosis not present

## 2023-09-02 DIAGNOSIS — Z1152 Encounter for screening for COVID-19: Secondary | ICD-10-CM | POA: Diagnosis not present

## 2023-09-02 DIAGNOSIS — Z6841 Body Mass Index (BMI) 40.0 and over, adult: Secondary | ICD-10-CM | POA: Diagnosis not present

## 2023-09-02 DIAGNOSIS — N400 Enlarged prostate without lower urinary tract symptoms: Secondary | ICD-10-CM | POA: Diagnosis not present

## 2023-09-02 DIAGNOSIS — R918 Other nonspecific abnormal finding of lung field: Secondary | ICD-10-CM | POA: Diagnosis not present

## 2023-09-02 DIAGNOSIS — A401 Sepsis due to streptococcus, group B: Secondary | ICD-10-CM | POA: Diagnosis not present

## 2023-09-02 DIAGNOSIS — R7881 Bacteremia: Secondary | ICD-10-CM | POA: Diagnosis not present

## 2023-09-02 DIAGNOSIS — I700 Atherosclerosis of aorta: Secondary | ICD-10-CM | POA: Diagnosis not present

## 2023-09-02 DIAGNOSIS — E1142 Type 2 diabetes mellitus with diabetic polyneuropathy: Secondary | ICD-10-CM | POA: Diagnosis not present

## 2023-09-02 DIAGNOSIS — I4891 Unspecified atrial fibrillation: Secondary | ICD-10-CM | POA: Diagnosis not present

## 2023-09-02 DIAGNOSIS — R0789 Other chest pain: Secondary | ICD-10-CM | POA: Diagnosis not present

## 2023-09-02 DIAGNOSIS — E119 Type 2 diabetes mellitus without complications: Secondary | ICD-10-CM | POA: Diagnosis not present

## 2023-09-02 DIAGNOSIS — K029 Dental caries, unspecified: Secondary | ICD-10-CM | POA: Diagnosis not present

## 2023-09-02 DIAGNOSIS — Z01810 Encounter for preprocedural cardiovascular examination: Secondary | ICD-10-CM | POA: Diagnosis not present

## 2023-09-02 DIAGNOSIS — E785 Hyperlipidemia, unspecified: Secondary | ICD-10-CM | POA: Diagnosis not present

## 2023-09-02 DIAGNOSIS — J90 Pleural effusion, not elsewhere classified: Secondary | ICD-10-CM | POA: Diagnosis not present

## 2023-09-02 DIAGNOSIS — I351 Nonrheumatic aortic (valve) insufficiency: Secondary | ICD-10-CM | POA: Diagnosis not present

## 2023-09-02 DIAGNOSIS — R195 Other fecal abnormalities: Secondary | ICD-10-CM | POA: Diagnosis not present

## 2023-09-02 DIAGNOSIS — I679 Cerebrovascular disease, unspecified: Secondary | ICD-10-CM | POA: Diagnosis not present

## 2023-09-02 DIAGNOSIS — M7911 Myalgia of mastication muscle: Secondary | ICD-10-CM | POA: Diagnosis not present

## 2023-09-02 DIAGNOSIS — R109 Unspecified abdominal pain: Secondary | ICD-10-CM | POA: Diagnosis not present

## 2023-09-02 DIAGNOSIS — E872 Acidosis, unspecified: Secondary | ICD-10-CM | POA: Diagnosis not present

## 2023-09-02 DIAGNOSIS — E876 Hypokalemia: Secondary | ICD-10-CM | POA: Diagnosis present

## 2023-09-02 DIAGNOSIS — E669 Obesity, unspecified: Secondary | ICD-10-CM | POA: Diagnosis not present

## 2023-09-02 DIAGNOSIS — J180 Bronchopneumonia, unspecified organism: Secondary | ICD-10-CM | POA: Diagnosis not present

## 2023-09-02 DIAGNOSIS — Z952 Presence of prosthetic heart valve: Secondary | ICD-10-CM | POA: Diagnosis not present

## 2023-09-02 DIAGNOSIS — A491 Streptococcal infection, unspecified site: Secondary | ICD-10-CM | POA: Diagnosis not present

## 2023-09-02 DIAGNOSIS — I38 Endocarditis, valve unspecified: Secondary | ICD-10-CM | POA: Diagnosis not present

## 2023-09-02 DIAGNOSIS — E871 Hypo-osmolality and hyponatremia: Secondary | ICD-10-CM | POA: Diagnosis not present

## 2023-09-02 DIAGNOSIS — I342 Nonrheumatic mitral (valve) stenosis: Secondary | ICD-10-CM | POA: Diagnosis not present

## 2023-09-02 DIAGNOSIS — I2510 Atherosclerotic heart disease of native coronary artery without angina pectoris: Secondary | ICD-10-CM | POA: Diagnosis not present

## 2023-09-02 DIAGNOSIS — E1165 Type 2 diabetes mellitus with hyperglycemia: Secondary | ICD-10-CM | POA: Diagnosis not present

## 2023-09-02 DIAGNOSIS — Z4682 Encounter for fitting and adjustment of non-vascular catheter: Secondary | ICD-10-CM | POA: Diagnosis not present

## 2023-09-02 DIAGNOSIS — I152 Hypertension secondary to endocrine disorders: Secondary | ICD-10-CM | POA: Diagnosis not present

## 2023-09-02 DIAGNOSIS — I2699 Other pulmonary embolism without acute cor pulmonale: Secondary | ICD-10-CM | POA: Diagnosis not present

## 2023-09-02 DIAGNOSIS — J9601 Acute respiratory failure with hypoxia: Secondary | ICD-10-CM | POA: Diagnosis not present

## 2023-09-02 DIAGNOSIS — R931 Abnormal findings on diagnostic imaging of heart and coronary circulation: Secondary | ICD-10-CM | POA: Diagnosis not present

## 2023-09-02 DIAGNOSIS — I6310 Cerebral infarction due to embolism of unspecified precerebral artery: Secondary | ICD-10-CM | POA: Diagnosis not present

## 2023-09-02 DIAGNOSIS — L03114 Cellulitis of left upper limb: Secondary | ICD-10-CM | POA: Diagnosis not present

## 2023-09-02 DIAGNOSIS — I214 Non-ST elevation (NSTEMI) myocardial infarction: Secondary | ICD-10-CM | POA: Diagnosis not present

## 2023-09-02 DIAGNOSIS — G9349 Other encephalopathy: Secondary | ICD-10-CM | POA: Diagnosis not present

## 2023-09-02 DIAGNOSIS — I10 Essential (primary) hypertension: Secondary | ICD-10-CM | POA: Diagnosis not present

## 2023-09-02 DIAGNOSIS — B955 Unspecified streptococcus as the cause of diseases classified elsewhere: Secondary | ICD-10-CM | POA: Diagnosis not present

## 2023-09-02 DIAGNOSIS — J189 Pneumonia, unspecified organism: Secondary | ICD-10-CM | POA: Diagnosis not present

## 2023-09-02 DIAGNOSIS — I059 Rheumatic mitral valve disease, unspecified: Secondary | ICD-10-CM | POA: Diagnosis not present

## 2023-09-02 DIAGNOSIS — I76 Septic arterial embolism: Secondary | ICD-10-CM | POA: Diagnosis not present

## 2023-09-02 DIAGNOSIS — B951 Streptococcus, group B, as the cause of diseases classified elsewhere: Secondary | ICD-10-CM | POA: Diagnosis not present

## 2023-09-02 DIAGNOSIS — J342 Deviated nasal septum: Secondary | ICD-10-CM | POA: Diagnosis not present

## 2023-09-02 DIAGNOSIS — G8918 Other acute postprocedural pain: Secondary | ICD-10-CM | POA: Diagnosis not present

## 2023-09-02 DIAGNOSIS — J1008 Influenza due to other identified influenza virus with other specified pneumonia: Secondary | ICD-10-CM | POA: Diagnosis not present

## 2023-09-02 DIAGNOSIS — Z955 Presence of coronary angioplasty implant and graft: Secondary | ICD-10-CM | POA: Diagnosis not present

## 2023-09-02 DIAGNOSIS — J09X2 Influenza due to identified novel influenza A virus with other respiratory manifestations: Secondary | ICD-10-CM | POA: Diagnosis not present

## 2023-09-02 DIAGNOSIS — E1159 Type 2 diabetes mellitus with other circulatory complications: Secondary | ICD-10-CM | POA: Diagnosis not present

## 2023-09-02 DIAGNOSIS — I252 Old myocardial infarction: Secondary | ICD-10-CM | POA: Diagnosis not present

## 2023-09-02 DIAGNOSIS — R58 Hemorrhage, not elsewhere classified: Secondary | ICD-10-CM | POA: Diagnosis not present

## 2023-09-02 DIAGNOSIS — Z0389 Encounter for observation for other suspected diseases and conditions ruled out: Secondary | ICD-10-CM | POA: Diagnosis not present

## 2023-09-02 DIAGNOSIS — F05 Delirium due to known physiological condition: Secondary | ICD-10-CM | POA: Diagnosis not present

## 2023-09-02 DIAGNOSIS — G4733 Obstructive sleep apnea (adult) (pediatric): Secondary | ICD-10-CM | POA: Diagnosis not present

## 2023-09-02 DIAGNOSIS — I6389 Other cerebral infarction: Secondary | ICD-10-CM | POA: Diagnosis not present

## 2023-09-02 HISTORY — PX: TEE WITHOUT CARDIOVERSION: SHX5443

## 2023-09-02 LAB — CBC
HCT: 39.5 % (ref 39.0–52.0)
Hemoglobin: 13.4 g/dL (ref 13.0–17.0)
MCH: 30.7 pg (ref 26.0–34.0)
MCHC: 33.9 g/dL (ref 30.0–36.0)
MCV: 90.4 fL (ref 80.0–100.0)
Platelets: 300 10*3/uL (ref 150–400)
RBC: 4.37 MIL/uL (ref 4.22–5.81)
RDW: 13.1 % (ref 11.5–15.5)
WBC: 11.2 10*3/uL — ABNORMAL HIGH (ref 4.0–10.5)
nRBC: 0 % (ref 0.0–0.2)

## 2023-09-02 LAB — BASIC METABOLIC PANEL
Anion gap: 9 (ref 5–15)
BUN: 17 mg/dL (ref 6–20)
CO2: 24 mmol/L (ref 22–32)
Calcium: 8.3 mg/dL — ABNORMAL LOW (ref 8.9–10.3)
Chloride: 101 mmol/L (ref 98–111)
Creatinine, Ser: 0.76 mg/dL (ref 0.61–1.24)
GFR, Estimated: >60 mL/min (ref >60)
Glucose, Bld: 235 mg/dL — ABNORMAL HIGH (ref 70–99)
Potassium: 4.4 mmol/L (ref 3.5–5.1)
Sodium: 134 mmol/L — ABNORMAL LOW (ref 135–145)

## 2023-09-02 LAB — ECHO TEE

## 2023-09-02 LAB — GLUCOSE, CAPILLARY: Glucose-Capillary: 214 mg/dL — ABNORMAL HIGH (ref 70–99)

## 2023-09-02 SURGERY — ECHOCARDIOGRAM, TRANSESOPHAGEAL
Anesthesia: General | Laterality: Right

## 2023-09-02 MED ORDER — PROPOFOL 500 MG/50ML IV EMUL
INTRAVENOUS | Status: DC | PRN
  Administered 2023-09-02: 145 ug/kg/min via INTRAVENOUS

## 2023-09-02 MED ORDER — SODIUM CHLORIDE 0.9 % IV SOLN: 2.0000 g | Freq: Two times a day (BID) | INTRAVENOUS | Status: DC

## 2023-09-02 MED ORDER — LIDOCAINE VISCOUS HCL 2 % MT SOLN
OROMUCOSAL | Status: AC
  Filled 2023-09-02: qty 15

## 2023-09-02 MED ORDER — SODIUM CHLORIDE 0.9 % IV SOLN: INTRAVENOUS | Status: DC | PRN

## 2023-09-02 MED ORDER — BUTAMBEN-TETRACAINE-BENZOCAINE 2-2-14 % EX AERO
INHALATION_SPRAY | CUTANEOUS | Status: AC
  Filled 2023-09-02: qty 5

## 2023-09-02 MED ORDER — PROPOFOL 10 MG/ML IV BOLUS
INTRAVENOUS | Status: DC | PRN
  Administered 2023-09-02: 20 mg via INTRAVENOUS
  Administered 2023-09-02: 60 mg via INTRAVENOUS
  Administered 2023-09-02: 20 mg via INTRAVENOUS

## 2023-09-02 MED ORDER — GLYCOPYRROLATE 0.2 MG/ML IJ SOLN
INTRAMUSCULAR | Status: DC | PRN
  Administered 2023-09-02: .2 mg via INTRAVENOUS

## 2023-09-02 MED ORDER — VITAMIN B-12 1000 MCG PO TABS: 1000.0000 ug | ORAL_TABLET | Freq: Every day | ORAL | Status: DC

## 2023-09-02 MED ORDER — STROKE: EARLY STAGES OF RECOVERY BOOK: Freq: Once | Status: AC

## 2023-09-02 MED ORDER — CYANOCOBALAMIN 1000 MCG PO TABS: 1000.0000 ug | ORAL_TABLET | Freq: Every day | ORAL | Status: AC

## 2023-09-02 MED ORDER — PHENYLEPHRINE 80 MCG/ML (10ML) SYRINGE FOR IV PUSH (FOR BLOOD PRESSURE SUPPORT)
PREFILLED_SYRINGE | INTRAVENOUS | Status: DC | PRN
  Administered 2023-09-02: 80 ug via INTRAVENOUS

## 2023-09-02 MED ORDER — CYANOCOBALAMIN 1000 MCG/ML IJ SOLN
1000.0000 ug | Freq: Every day | INTRAMUSCULAR | Status: DC
  Administered 2023-09-02: 1000 ug via INTRAMUSCULAR
  Filled 2023-09-02: qty 1

## 2023-09-02 NOTE — Progress Notes (Signed)
 Report called and given to Rogers Blocker, RN at [facility] at this time.  Patient being taken by [facility] at this time.  No s/s of distress noted denies pain.  All belongings packed by wife and some sent with patient and some she took.

## 2023-09-02 NOTE — Anesthesia Procedure Notes (Signed)
 Procedure Name: General with mask airway Date/Time: 09/02/2023 1:48 PM  Performed by: Mohammed Kindle, CRNAPre-anesthesia Checklist: Patient identified, Emergency Drugs available, Suction available and Patient being monitored Patient Re-evaluated:Patient Re-evaluated prior to induction Oxygen Delivery Method: Simple face mask Induction Type: IV induction Placement Confirmation: positive ETCO2 and breath sounds checked- equal and bilateral Dental Injury: Teeth and Oropharynx as per pre-operative assessment

## 2023-09-02 NOTE — Transfer of Care (Signed)
 Immediate Anesthesia Transfer of Care Note  Patient: Connor Ross  Procedure(s) Performed: TRANSESOPHAGEAL ECHOCARDIOGRAM (TEE) (Right)  Patient Location:  [facility]   Anesthesia Type:General  Level of Consciousness: awake, drowsy, and patient cooperative  Airway & Oxygen Therapy: Patient Spontanous Breathing and Patient connected to nasal cannula oxygen  Post-op Assessment: Report given to RN and Post -op Vital signs reviewed and stable  Post vital signs: Reviewed and stable  Last Vitals:  Vitals Value Taken Time  BP 121/62 (77)   Temp    Pulse 96   Resp 18   SpO2 96%     Last Pain:  Vitals:   09/02/23 1320  TempSrc: Oral  PainSc: 4          Complications: No notable events documented.

## 2023-09-02 NOTE — Progress Notes (Signed)
   Date of Admission:  08/25/2023    PT has mitral valve vegetation of 2 cm and has been transferred to [facility]- did not se ehim today  Assessment/Plan:   58 year old male presenting with confusion, vomiting and diarrhea and pain left hand and fever   Influenza a viral illness   Streptococcus group B  bacteremia  Left hand cellulitis The source of the cellulitis is likely from the fingersticks he does for diabetic checkup on ceftriaxone being treated like CNS septci emboli with BID dose Repeat BC neg Left hand cellulitis. 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  Seen by cardiology.was on Iv heparin- now DC CAD status post stents

## 2023-09-02 NOTE — Progress Notes (Signed)
*  PRELIMINARY RESULTS* Echocardiogram Echocardiogram Transesophageal has been performed.  Connor Ross 09/02/2023, 2:14 PM

## 2023-09-02 NOTE — Anesthesia Preprocedure Evaluation (Signed)
 Anesthesia Evaluation  Patient identified by MRN, date of birth, ID band Patient awake    Reviewed: Allergy & Precautions, NPO status , Patient's Chart, lab work & pertinent test results  History of Anesthesia Complications Negative for: history of anesthetic complications  Airway Mallampati: III  TM Distance: <3 FB Neck ROM: full    Dental  (+) Chipped   Pulmonary neg shortness of breath, sleep apnea    Pulmonary exam normal        Cardiovascular Exercise Tolerance: Good hypertension, + Past MI  Normal cardiovascular exam     Neuro/Psych  Neuromuscular disease  negative psych ROS   GI/Hepatic negative GI ROS, Neg liver ROS,,,  Endo/Other  diabetes, Type 2    Renal/GU negative Renal ROS  negative genitourinary   Musculoskeletal   Abdominal   Peds  Hematology negative hematology ROS (+)   Anesthesia Other Findings Past Medical History: No date: Diabetes mellitus 04/23/2019: 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 type 2  diabetes mellitus, limited to breakdown of skin (HCC) 08/27/2020: Fracture of one rib, left side, subsequent encounter for  fracture with routine healing No date: Hyperlipidemia No date: Hypertension 2012: Normal cardiac stress test     Comment:  Fath  Past Surgical History: 06/25/2022: CORONARY STENT INTERVENTION; N/A     Comment:  Procedure: CORONARY STENT INTERVENTION;  Surgeon:               Alwyn Pea, MD;  Location: [facility];               Service: Cardiovascular;  Laterality: N/A; 06/26/2022: CORONARY STENT INTERVENTION; N/A     Comment:  Procedure: CORONARY STENT INTERVENTION;  Surgeon:               Alwyn Pea, MD;  Location: [facility];               Service: Cardiovascular;  Laterality: N/A; 06/2019: EXOSTECTOMY; Right     Comment:  5th MT head  ([facility]) 06/25/2022: LEFT HEART CATH AND CORONARY ANGIOGRAPHY; N/A     Comment:   Procedure: LEFT HEART CATH AND CORONARY ANGIOGRAPHY WITH              POSSIBLE STENT;  Surgeon: Alwyn Pea, MD;                Location: [facility];  Service: Cardiovascular;              Laterality: N/A;  12:30 following DC first case No date: LUMBAR DISC SURGERY  BMI    Body Mass Index: 40.46 kg/m      Reproductive/Obstetrics negative OB ROS                             Anesthesia Physical Anesthesia Plan  ASA: 3  Anesthesia Plan: General   Post-op Pain Management:    Induction: Intravenous  PONV Risk Score and Plan: Propofol infusion and TIVA  Airway Management Planned: Natural Airway and Nasal Cannula  Additional Equipment:   Intra-op Plan:   Post-operative Plan:   Informed Consent: I have reviewed the patients History and Physical, chart, labs and discussed the procedure including the risks, benefits and alternatives for the proposed anesthesia with the patient or authorized representative who has indicated his/her understanding and acceptance.     Dental Advisory Given  Plan Discussed with: Anesthesiologist, CRNA and Surgeon  Anesthesia Plan Comments: (Patient consented for risks of anesthesia including but not limited to:  - adverse reactions to medications - risk of airway placement if required - damage to eyes, teeth, lips or other oral mucosa - nerve damage due to positioning  - sore throat or hoarseness - Damage to heart, brain, nerves, lungs, other parts of body or loss of life  Patient voiced understanding and assent.)       Anesthesia Quick Evaluation

## 2023-09-02 NOTE — Progress Notes (Signed)
 Spoke with representative from [facility] [address] [place] [address] at this time.  Informed patient has a ready bed room [id number](unit # [phone]).  Accepting cardiologist is Manson Allan MD.

## 2023-09-02 NOTE — Progress Notes (Signed)
 [facility] CLINIC CARDIOLOGY PROGRESS NOTE       Patient ID: Connor Ross MRN: [id number] DOB/AGE: [birth year] 58 y.o.  Admit date: 08/25/2023 Referring Physician Dr. Valente David Primary Physician Sherlene Shams, MD  Primary Cardiologist Dr. Toy Baker, PA Reason for Consultation Elevated troponin  HPI: Connor Ross is a 58 y.o. male  with a past medical history of non-STEMI coronary disease (s/p previous stents to prox and mid LAD in remote past, s/p PCI and stent to distal RCA and overlapping prox RCA 06/2022) diabetes, hypertension, obesity, hyperlipidemia who presented to the ED on 08/25/2023 for fever, vomiting, diarrhea. Troponins checked and found to be elevated. Cardiology was consulted for further evaluation.   Interval history: -Patient seen and examined this morning, resting comfortably in bedside chair. -He is without any chest pain or shortness of breath symptoms.  On room air. -BP and heart rate remain well-controlled. -Renal function and electrolytes stable. -Going for TEE today.  Review of systems complete and found to be negative unless listed above    Past Medical History:  Diagnosis Date   Diabetes mellitus    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 type 2 diabetes mellitus, limited to breakdown of skin (HCC) 04/23/2019   Fracture of one rib, left side, subsequent encounter for fracture with routine healing 08/27/2020   Hyperlipidemia    Hypertension    Normal cardiac stress test 2012   [facility]    Past Surgical History:  Procedure Laterality Date   CORONARY STENT INTERVENTION N/A 06/25/2022   Procedure: CORONARY STENT INTERVENTION;  Surgeon: Alwyn Pea, MD;  Location: [facility] INVASIVE CV LAB;  Service: Cardiovascular;  Laterality: N/A;   CORONARY STENT INTERVENTION N/A 06/26/2022   Procedure: CORONARY STENT INTERVENTION;  Surgeon: Alwyn Pea, MD;  Location: [facility] INVASIVE CV LAB;  Service: Cardiovascular;   Laterality: N/A;   EXOSTECTOMY Right 06/2019   5th MT head  ([facility])   LEFT HEART CATH AND CORONARY ANGIOGRAPHY N/A 06/25/2022   Procedure: LEFT HEART CATH AND CORONARY ANGIOGRAPHY WITH POSSIBLE STENT;  Surgeon: Alwyn Pea, MD;  Location: [facility] INVASIVE CV LAB;  Service: Cardiovascular;  Laterality: N/A;  12:30 following DC first case   LUMBAR DISC SURGERY      Medications Prior to Admission  Medication Sig Dispense Refill Last Dose/Taking   ASPIRIN LOW DOSE 81 MG EC tablet TAKE 1 TABLET BY MOUTH  DAILY (SWALLOW WHOLE) 90 tablet 1 08/24/2023   empagliflozin (JARDIANCE) 25 MG TABS tablet Take 1 tablet (25 mg total) by mouth daily. 90 tablet 3 08/24/2023   fluticasone (FLONASE) 50 MCG/ACT nasal spray PLACE 2 SPRAYS INTO BOTH NOSTRILS DAILY. AT NIGHT AFTER NASAL SALINE 16 g 2 Taking   gabapentin (NEURONTIN) 100 MG capsule TAKE 2 CAPSULES THREE TIMES DAILY 540 capsule 3 08/24/2023   insulin aspart (NOVOLOG FLEXPEN) 100 UNIT/ML FlexPen Inject up to 25 units with breakfast, 25 units with lunch, and 30 units with supper 72 mL 2 08/25/2023   insulin degludec (TRESIBA FLEXTOUCH) 200 UNIT/ML FlexTouch Pen Inject 88 Units into the skin daily. (Patient taking differently: Inject 86 Units into the skin daily.) 48 mL 2 08/25/2023   ketoconazole (NIZORAL) 2 % cream SMARTSIG:1 Application Topical 1 to 2 Times Daily   Past Week   Multiple Vitamin (MULTIVITAMIN) tablet Take 1 tablet by mouth daily.   08/24/2023   tamsulosin (FLOMAX) 0.4 MG CAPS capsule TAKE 1 CAPSULE EVERY DAY 90 capsule 3 08/24/2023   traMADol (ULTRAM)  50 MG tablet Take 1 tablet (50 mg total) by mouth every 6 (six) hours as needed. 360 tablet 1 Taking As Needed   triamcinolone (KENALOG) 0.025 % cream Apply topically.   08/24/2023   Upadacitinib ER (RINVOQ) 15 MG TB24 Take 15 mg by mouth daily.   08/24/2023   Vitamin D, Ergocalciferol, (DRISDOL) 1.25 MG (50000 UNIT) CAPS capsule Take 50,000 Units by mouth once a week.   Taking   blood glucose meter  kit and supplies Dispense based on patient and insurance preference. Use up to four times daily as directed. (FOR ICD-10 E10.9, E11.9). Use to check blood sugar three times daily 1 each 0    cefadroxil (DURICEF) 500 MG capsule Take 1 capsule (500 mg total) by mouth 2 (two) times daily. (Patient not taking: Reported on 08/26/2023) 14 capsule 0 Not Taking   clobetasol cream (TEMOVATE) 0.05 % Apply topically 2 (two) times daily. (Patient not taking: Reported on 08/26/2023)   Not Taking   clopidogrel (PLAVIX) 75 MG tablet Take 75 mg by mouth daily. (Patient not taking: Reported on 08/26/2023)   Not Taking   Continuous Blood Gluc Sensor (FREESTYLE LIBRE 2 SENSOR) MISC Use to check sugar at least 4 times daily 1 each 2    furosemide (LASIX) 20 MG tablet TAKE 1 TABLET EVERY OTHER DAY (Patient not taking: Reported on 08/26/2023) 45 tablet 3 Not Taking   Insulin Pen Needle 32G X 6 MM MISC Use daily with insulin pen 50 each 0    Lancets (FREESTYLE) lancets 1 each by Other route 2 (two) times daily. DX 250.02 100 each 12    losartan (COZAAR) 100 MG tablet TAKE 1 TABLET EVERY DAY 90 tablet 3    metoprolol tartrate (LOPRESSOR) 50 MG tablet Take 1 tablet (50 mg total) by mouth 2 (two) times daily. (Patient not taking: Reported on 08/26/2023) 180 tablet 1 Not Taking   ondansetron (ZOFRAN-ODT) 4 MG disintegrating tablet Take 1 tablet (4 mg total) by mouth every 8 (eight) hours as needed for nausea or vomiting. (Patient not taking: Reported on 08/26/2023) 20 tablet 0 Not Taking   ONETOUCH VERIO test strip USE TO CHECK BLOOD SUGAR 3  TIMES DAILY 300 strip 3    potassium chloride SA (KLOR-CON M) 20 MEQ tablet Take 2 tablets (40 mEq total) by mouth daily for 3 days. 6 tablet 0    rosuvastatin (CRESTOR) 40 MG tablet Take 1 tablet (40 mg total) by mouth every evening for 360 doses. (Patient not taking: Reported on 08/26/2023) 90 tablet 3 Not Taking   Semaglutide, 2 MG/DOSE, (OZEMPIC, 2 MG/DOSE,) 8 MG/3ML SOPN Inject 2 mg into the  skin once a week. (Patient not taking: Reported on 08/26/2023) 8 mL 2 Not Taking   Social History   Socioeconomic History   Marital status: Married    Spouse name: Not on file   Number of children: Not on file   Years of education: Not on file   Highest education level: 12th grade  Occupational History   Not on file  Tobacco Use   Smoking status: Never   Smokeless tobacco: Never  Substance and Sexual Activity   Alcohol use: No   Drug use: No   Sexual activity: Not on file  Other Topics Concern   Not on file  Social History Narrative   Married   Social Drivers of Health   Financial Resource Strain: Medium Risk (06/30/2023)   Overall Financial Resource Strain (CARDIA)    Difficulty of Paying  Living Expenses: Somewhat hard  Food Insecurity: Patient Declined (08/27/2023)   Hunger Vital Sign    Worried About Running Out of Food in the Last Year: Patient declined    Ran Out of Food in the Last Year: Patient declined  Transportation Needs: Patient Declined (08/27/2023)   PRAPARE - Administrator, Civil Service (Medical): Patient declined    Lack of Transportation (Non-Medical): Patient declined  Physical Activity: Insufficiently Active (06/30/2023)   Exercise Vital Sign    Days of Exercise per Week: 2 days    Minutes of Exercise per Session: 10 min  Stress: No Stress Concern Present (06/30/2023)   [employer] of Occupational Health - Occupational Stress Questionnaire    Feeling of Stress : Not at all  Social Connections: Moderately Integrated (06/30/2023)   Social Connection and Isolation Panel [NHANES]    Frequency of Communication with Friends and Family: More than three times a week    Frequency of Social Gatherings with Friends and Family: Twice a week    Attends Religious Services: 1 to 4 times per year    Active Member of [employer] or Organizations: No    Attends Banker Meetings: Never    Marital Status: Married  Catering manager Violence:  Patient Declined (08/27/2023)   Humiliation, Afraid, Rape, and Kick questionnaire    Fear of Current or Ex-Partner: Patient declined    Emotionally Abused: Patient declined    Physically Abused: Patient declined    Sexually Abused: Patient declined    Family History  Problem Relation Age of Onset   Coronary artery disease Mother 65   COPD Mother    Heart disease Mother    Hyperlipidemia Mother    Hypertension Mother    Cancer Father    Hyperlipidemia Father    Hypertension Brother    Cancer Brother        lymphoma   Diabetes Maternal Aunt    Diabetes Maternal Uncle    Diabetes Maternal Grandmother    Heart disease Maternal Grandmother      Vitals:   09/02/23 0018 09/02/23 0510 09/02/23 0904 09/02/23 0925  BP: 117/76 (!) 112/58 (!) 142/71 138/70  Pulse: 91 87 86 96  Resp: 20 19    Temp: 98.8 F (37.1 C) 99 F (37.2 C) 98.2 F (36.8 C)   TempSrc: Oral Oral Oral   SpO2: 95% 96% 96%   Height:        PHYSICAL EXAM General: Well appearing male, well nourished, in no acute distress. HEENT: Normocephalic and atraumatic. Neck: No JVD.  Lungs: Normal respiratory effort on room air. Clear bilaterally to auscultation. No wheezes, crackles, rhonchi.  Heart: HRRR, controlled rate. Normal S1 and S2 without gallops or murmurs.  Abdomen: Non-distended appearing.  Msk: Normal strength and tone for age. Extremities: Warm and well perfused. No clubbing, cyanosis. No edema.  Neuro: Alert and oriented X 3. Psych: Answers questions appropriately.   Labs: Basic Metabolic Panel: Recent Labs    09/01/23 0331 09/02/23 0610  NA 136 134*  K 3.9 4.4  CL 99 101  CO2 22 24  GLUCOSE 193* 235*  BUN 18 17  CREATININE 0.69 0.76  CALCIUM 8.5* 8.3*   Liver Function Tests: No results for input(s): "AST", "ALT", "ALKPHOS", "BILITOT", "PROT", "ALBUMIN" in the last 72 hours.  No results for input(s): "LIPASE", "AMYLASE" in the last 72 hours.  CBC: Recent Labs    09/01/23 0331  09/02/23 0610  WBC 12.7* 11.2*  HGB 14.1  13.4  HCT 41.1 39.5  MCV 89.5 90.4  PLT 322 300   Cardiac Enzymes: No results for input(s): "CKTOTAL", "CKMB", "CKMBINDEX", "TROPONINIHS" in the last 72 hours.  BNP: No results for input(s): "BNP" in the last 72 hours. D-Dimer: No results for input(s): "DDIMER" in the last 72 hours. Hemoglobin A1C: No results for input(s): "HGBA1C" in the last 72 hours. Fasting Lipid Panel: No results for input(s): "CHOL", "HDL", "LDLCALC", "TRIG", "CHOLHDL", "LDLDIRECT" in the last 72 hours.  Thyroid Function Tests: No results for input(s): "TSH", "T4TOTAL", "T3FREE", "THYROIDAB" in the last 72 hours.  Invalid input(s): "FREET3" Anemia Panel: Recent Labs    09/01/23 0331  VITAMINB12 292     Radiology: CT ANGIO HEAD NECK W WO CM Result Date: 08/31/2023 CLINICAL DATA:  Neuro deficit, acute, stroke suspected multifocal embolic strokes - septic emboli? EXAM: CT ANGIOGRAPHY HEAD AND NECK WITH AND WITHOUT CONTRAST TECHNIQUE: Multidetector CT imaging of the head and neck was performed using the standard protocol during bolus administration of intravenous contrast. Multiplanar CT image reconstructions and MIPs were obtained to evaluate the vascular anatomy. Carotid stenosis measurements (when applicable) are obtained utilizing NASCET criteria, using the distal internal carotid diameter as the denominator. RADIATION DOSE REDUCTION: This exam was performed according to the departmental dose-optimization program which includes automated exposure control, adjustment of the mA and/or kV according to patient size and/or use of iterative reconstruction technique. CONTRAST:  75mL OMNIPAQUE IOHEXOL 350 MG/ML SOLN COMPARISON:  MRI head August 29, 2023. FINDINGS: CT HEAD FINDINGS Brain: Numerous small infarcts better characterized on recent MRI. No progressive mass effect or acute hemorrhage. No mass lesion, midline shift or hydrocephalus. Vascular: See below. Skull: No acute  fracture. Sinuses/Orbits: Clear sinuses.  No acute orbital findings. Other: No mastoid effusions. Review of the MIP images confirms the above findings CTA NECK FINDINGS Aortic arch: Great vessel origins are patent. No significant stenosis. Right carotid system: No evidence of dissection, stenosis (50% or greater), or occlusion. Left carotid system: No evidence of dissection, stenosis (50% or greater), or occlusion. Vertebral arteries: Right dominant. No evidence of dissection, stenosis (50% or greater), or occlusion. Skeleton: No acute fracture on limited assessment. Other neck: No acute abnormality on limited assessment. Upper chest: Multifocal nodular opacities in the right upper lobe. Review of the MIP images confirms the above findings CTA HEAD FINDINGS Anterior circulation: Bilateral intracranial ICAs, MCAs, and ACAs are patent without proximal hemodynamically significant stenosis. No aneurysm identified. Posterior circulation: Bilateral intradural vertebral arteries, basilar artery and bilateral posterior shoulders are patent without proximal hemodynamically significant stenosis. No aneurysm identified. Venous sinuses: As permitted by contrast timing, patent. Review of the MIP images confirms the above findings IMPRESSION: 1. No large vessel occlusion or proximal hemodynamically significant stenosis. 2. Multifocal nodular opacities in the right upper lobe, partially imaged and possibly representing septic emboli given the clinical history. Recommend CT of the chest (preferably with contrast) for further evaluation. Electronically Signed   By: Feliberto Harts M.D.   On: 08/31/2023 19:47   MR BRAIN W WO CONTRAST Result Date: 08/29/2023 CLINICAL DATA:  Mental status change, unknown cause EXAM: MRI HEAD WITHOUT AND WITH CONTRAST TECHNIQUE: Multiplanar, multiecho pulse sequences of the brain and surrounding structures were obtained without and with intravenous contrast. CONTRAST:  10mL GADAVIST GADOBUTROL 1  MMOL/ML IV SOLN COMPARISON:  CT head August 25, 2023. 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 Mild associated edema without mass  effect. No midline shift. No acute hemorrhage, mass lesion or hydrocephalus. Small remote lacunar infarcts in the thalami bilaterally. No pathologic enhancement. Vascular: Major arterial flow voids are maintained at the skull base. Skull and upper cervical spine: Normal marrow signal. Sinuses/Orbits: Clear sinuses.  No acute orbital findings. Other: No mastoid effusions. 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 Given involvement of multiple vascular territories, consider an embolic etiology. No mass effect. These results will be called to the ordering clinician or representative by the Radiologist Assistant, and communication documented in the PACS or [employer]. Electronically Signed   By: Feliberto Harts M.D.   On: 08/29/2023 21:36   ECHOCARDIOGRAM COMPLETE Result Date: 08/27/2023    ECHOCARDIOGRAM REPORT   Patient Name:   Connor Ross [facility] Date of Exam: 08/27/2023 Medical Rec #:  [id number]             Height:       71.0 in Accession #:    [id number]            Weight:       290.2 lb Date of Birth:  24-Sep-1965             BSA:          2.470 m Patient Age:    57 years              BP:           129/71 mmHg Patient Gender: M                     HR:           84 bpm. Exam Location:  [facility] Procedure: 2D Echo, Cardiac Doppler, Color Doppler and Intracardiac            Opacification Agent (Both Spectral and Color Flow Doppler were            utilized during procedure). Indications:     NSTEMI  History:         Patient has prior history of Echocardiogram examinations, most                  recent 06/24/2022. Acute MI and Previous Myocardial Infarction;                  Risk Factors:Hypertension, Diabetes and Sleep Apnea.  Infulenza                  +.  Sonographer:     Mikki Harbor Referring Phys:  [id number] JAN A MANSY Diagnosing Phys: Alwyn Pea MD  Sonographer Comments: Technically difficult study due to poor echo windows and patient is obese. IMPRESSIONS  1. Left ventricular ejection fraction, by estimation, is 65 to 70%. The left ventricle has normal function. The left ventricle has no regional wall motion abnormalities. There is moderate concentric left ventricular hypertrophy. Left ventricular diastolic parameters are consistent with Grade II diastolic dysfunction (pseudonormalization).  2. Right ventricular systolic function is normal. The right ventricular size is normal.  3. The mitral valve is normal in structure. Trivial mitral valve regurgitation.  4. The aortic valve is normal in structure. Aortic valve regurgitation is not visualized. FINDINGS  Left Ventricle: Left ventricular ejection fraction, by estimation, is 65 to 70%. The left ventricle has normal function. The left ventricle has no regional wall motion abnormalities. Definity contrast agent was given IV to delineate the left ventricular  endocardial borders. Strain imaging was  not performed. The left ventricular internal cavity size was normal in size. There is moderate concentric left ventricular hypertrophy. Left ventricular diastolic parameters are consistent with Grade II diastolic dysfunction (pseudonormalization). Right Ventricle: The right ventricular size is normal. No increase in right ventricular wall thickness. Right ventricular systolic function is normal. Left Atrium: Left atrial size was normal in size. Right Atrium: Right atrial size was normal in size. Pericardium: There is no evidence of pericardial effusion. Mitral Valve: The mitral valve is normal in structure. There is moderate thickening of the mitral valve leaflet(s). There is moderate calcification of the mitral valve leaflet(s). Normal mobility of the mitral valve leaflets.  Trivial mitral valve regurgitation. MV peak gradient, 8.3 mmHg. The mean mitral valve gradient is 4.0 mmHg. Tricuspid Valve: The tricuspid valve is normal in structure. Tricuspid valve regurgitation is trivial. Aortic Valve: The aortic valve is normal in structure. Aortic valve regurgitation is not visualized. Aortic valve mean gradient measures 6.0 mmHg. Aortic valve peak gradient measures 13.0 mmHg. Aortic valve area, by VTI measures 2.53 cm. Pulmonic Valve: The pulmonic valve was normal in structure. Pulmonic valve regurgitation is not visualized. Aorta: The ascending aorta was not well visualized. IAS/Shunts: No atrial level shunt detected by color flow Doppler. Additional Comments: 3D imaging was not performed.  LEFT VENTRICLE PLAX 2D LVIDd:         4.90 cm   Diastology LVIDs:         2.90 cm   LV e' medial:    5.77 cm/s LV PW:         1.50 cm   LV E/e' medial:  22.9 LV IVS:        1.40 cm   LV e' lateral:   5.98 cm/s LVOT diam:     2.10 cm   LV E/e' lateral: 22.1 LV SV:         82 LV SV Index:   33 LVOT Area:     3.46 cm  LEFT ATRIUM              Index LA diam:        4.30 cm  1.74 cm/m LA Vol (A2C):   109.0 ml 44.13 ml/m LA Vol (A4C):   91.6 ml  37.09 ml/m LA Biplane Vol: 102.0 ml 41.30 ml/m  AORTIC VALVE                     PULMONIC VALVE AV Area (Vmax):    2.16 cm      PV Vmax:       1.01 m/s AV Area (Vmean):   2.39 cm      PV Peak grad:  4.1 mmHg AV Area (VTI):     2.53 cm AV Vmax:           180.00 cm/s AV Vmean:          113.000 cm/s AV VTI:            0.326 m AV Peak Grad:      13.0 mmHg AV Mean Grad:      6.0 mmHg LVOT Vmax:         112.00 cm/s LVOT Vmean:        78.100 cm/s LVOT VTI:          0.238 m LVOT/AV VTI ratio: 0.73  AORTA Ao Root diam: 4.10 cm MITRAL VALVE MV Area (PHT): 3.50 cm     SHUNTS MV Area VTI:   2.37 cm  Systemic VTI:  0.24 m MV Peak grad:  8.3 mmHg     Systemic Diam: 2.10 cm MV Mean grad:  4.0 mmHg MV Vmax:       1.44 m/s MV Vmean:      94.0 cm/s MV Decel Time: 217 msec  MV E velocity: 132.00 cm/s MV A velocity: 132.00 cm/s MV E/A ratio:  1.00 Alwyn Pea MD Electronically signed by Alwyn Pea MD Signature Date/Time: 08/27/2023/7:55:21 PM    Final    DG Hand Complete Left Result Date: 08/26/2023 CLINICAL DATA:  Left hand swelling. EXAM: LEFT HAND - COMPLETE 3+ VIEW COMPARISON:  None Available. FINDINGS: No fracture or bone lesion. Joints are normally spaced and aligned. Soft tissue swelling, most evident over the dorsal aspect of the hand and wrist. No soft tissue air. No radiopaque foreign body. IMPRESSION: 1. No fracture or acute finding. 2. Nonspecific soft tissue swelling. Electronically Signed   By: Amie Portland M.D.   On: 08/26/2023 15:26   CT ABDOMEN PELVIS W CONTRAST Result Date: 08/25/2023 CLINICAL DATA:  Fever, nausea/vomiting/diarrhea, abdominal pain, polyuria EXAM: CT ABDOMEN AND PELVIS WITH CONTRAST TECHNIQUE: Multidetector CT imaging of the abdomen and pelvis was performed using the standard protocol following bolus administration of intravenous contrast. RADIATION DOSE REDUCTION: This exam was performed according to the departmental dose-optimization program which includes automated exposure control, adjustment of the mA and/or kV according to patient size and/or use of iterative reconstruction technique. CONTRAST:  OMNIPAQUE IOHEXOL 350 MG/ML SOLN COMPARISON:  04/03/2012 FINDINGS: Lower chest: There is minimal subpleural right lower lobe airspace disease, which could reflect focal pneumonia. No effusion. Hepatobiliary: No focal liver abnormality is seen. No gallstones, gallbladder wall thickening, or biliary dilatation. Pancreas: Unremarkable. No pancreatic ductal dilatation or surrounding inflammatory changes. Spleen: Normal in size without focal abnormality. Adrenals/Urinary Tract: The kidneys enhance normally and symmetrically. No urinary tract calculi or obstructive uropathy within either kidney. Bladder is minimally distended, with no  filling defects. The adrenals are normal. Stomach/Bowel: No bowel obstruction or ileus. Normal appendix right lower quadrant. Scattered diverticulosis of the sigmoid colon without diverticulitis. No bowel wall thickening or inflammatory change. Vascular/Lymphatic: Aortic atherosclerosis. No enlarged abdominal or pelvic lymph nodes. Reproductive: Prostate is unremarkable. Other: No free fluid or free intraperitoneal gas. No abdominal wall hernia. Musculoskeletal: No acute or destructive bony abnormalities. Bilateral hip osteoarthritis, left greater than right. Reconstructed images demonstrate no additional findings. IMPRESSION: 1. Patchy subpleural right lower lobe airspace disease, consistent with focal bronchopneumonia. 2. No acute intra-abdominal or intrapelvic process. 3.  Aortic Atherosclerosis (ICD10-I70.0). Electronically Signed   By: Sharlet Salina M.D.   On: 08/25/2023 23:08   DG Chest Port 1 View Result Date: 08/25/2023 CLINICAL DATA:  Sepsis, abdominal pain, polyuria, diabetes EXAM: PORTABLE CHEST 1 VIEW COMPARISON:  06/23/2022 FINDINGS: Single frontal view of the chest demonstrates an enlarged cardiac silhouette. Lung volumes are diminished, with crowding of the central vasculature. No effusion or pneumothorax. No acute bony abnormalities. IMPRESSION: 1. Low lung volumes, with crowding of the pulmonary vasculature. 2. Enlarged cardiac silhouette. Electronically Signed   By: Sharlet Salina M.D.   On: 08/25/2023 22:45   CT Head Wo Contrast Result Date: 08/25/2023 CLINICAL DATA:  Fever. Mental status change of unknown cause. Diabetic. EXAM: CT HEAD WITHOUT CONTRAST TECHNIQUE: Contiguous axial images were obtained from the base of the skull through the vertex without intravenous contrast. RADIATION DOSE REDUCTION: This exam was performed according to the departmental dose-optimization program which includes automated exposure  control, adjustment of the mA and/or kV according to patient size and/or use  of iterative reconstruction technique. COMPARISON:  None Available. FINDINGS: Brain: The study suffers from motion degradation. With repeat imaging, a satisfactory exam is achieved. No sign of old or acute focal infarction, mass lesion, hemorrhage, hydrocephalus or extra-axial collection. Vascular: There is atherosclerotic calcification of the major vessels at the base of the brain. Skull: Negative Sinuses/Orbits: Apparent soft tissue swelling of the right posterior parietal scalp, presumed hematoma. Orbits negative. Other: None IMPRESSION: No acute intracranial finding. Apparent soft tissue swelling of the right posterior parietal scalp, presumed hematoma. Electronically Signed   By: Paulina Fusi M.D.   On: 08/25/2023 22:39    ECHO as above  TELEMETRY reviewed by me 09/02/2023: sinus rhythm rate 80s  EKG reviewed by me: sinus tachycardia rate 101 bpm  Data reviewed by me 09/02/2023: last 24h vitals tele labs imaging I/O hospitalist progress note  Principal Problem:   NSTEMI (non-ST elevated myocardial infarction) (HCC) Active Problems:   Sepsis due to pneumonia (HCC)   Hypokalemia   Metabolic acidosis   Type 2 diabetes mellitus with peripheral neuropathy (HCC)   Essential hypertension   BPH (benign prostatic hyperplasia)   Influenza A   Hypocalcemia   Septicemia due to group D Streptococcus (HCC)   Bacteremia due to group B Streptococcus   Sepsis due to group B Streptococcus (HCC)   Encephalopathy    ASSESSMENT AND PLAN:  Connor Ross is a 58 y.o. male  with a past medical history of non-STEMI coronary disease (s/p previous stents to prox and mid LAD in remote past, s/p PCI and stent to distal RCA and overlapping prox RCA 06/2022) diabetes, hypertension, obesity, hyperlipidemia who presented to the ED on 08/25/2023 for fever, vomiting, diarrhea. Troponins checked and found to be elevated. Cardiology was consulted for further evaluation.   # Acute CVA He had an episode on Friday  of confusion and delirium.  MRI brain at that time revealed numerous small acute infarcts.  Concern for cardioembolic source. -Will plan for TEE today for evaluation of endocarditis given bacteremia and acute CVA's.  Patient understands process of procedure, risk/benefits and is amenable to proceeding.  N.p.o. until procedure.  # NSTEMI # Coronary artery disease # Hypertension Patient with history of coronary disease with prior stenting in 06/2022 found to have elevated troponins upon admission.  No reports of chest pain symptoms.  Troponins trended 89 > 272 > 576 > 607 > 1071 > 1226 > 817.  Echo this admission with normal EF, no wall motion abnormalities, moderate LVH, grade 2 diastolic dysfunction. -Completed 48-72 hours heparin infusion. -S/p ASA 325 mg in the ED.  Continue rosuvastatin 40 mg daily, aspirin 81 mg daily, Plavix 75 mg daily. -Continue losartan 100 mg daily, metoprolol tartrate 50 mg twice daily. -No plan for further cardiac diagnostics at this time given strep bacteremia.  Can consider cath in future for evaluation after infection has been treated appropriately.  # Influenza A infection # Pneumonia # Strep agalactiae bacteremia Patient presented to the ED with fever/chills, vomiting, diarrhea.  Reports feeling poorly for roughly 1 week.  Flu a positive in the ED.  CTA abdomen concerning for right lower lobe pneumonia.  Blood culture positive for strep agalactiae -Further management per primary.   This patient's plan of care was discussed and created with Dr. Corky Sing and he is in agreement.  Signed: Gale Journey, PA-C  09/02/2023, 9:45 AM [facility]

## 2023-09-02 NOTE — Discharge Summary (Signed)
 [facility] Discharge Summary   Patient: Connor Ross MVH:[id number]  PCP: Sherlene Shams, MD  Date of admission: 08/25/2023   Date of discharge:  09/02/2023     Discharge Diagnoses:  Principal Problem:   NSTEMI (non-ST elevated myocardial infarction) [facility]) Active Problems:   Sepsis due to pneumonia (HCC)   Hypokalemia   Metabolic acidosis   Type 2 diabetes mellitus with peripheral neuropathy (HCC)   Essential hypertension   BPH (benign prostatic hyperplasia)   Influenza A   Hypocalcemia   Septicemia due to group D Streptococcus (HCC)   Bacteremia due to group B Streptococcus   Sepsis due to group B Streptococcus (HCC)   Encephalopathy   Admitted From: Home Disposition: Transfer to [facility] under cardiology service due to endocarditis, 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 Go in 1 week(s).   Specialties: Cardiology, Internal Medicine Contact information: [address] [place] [address] [phone]                Diet recommendation: Cardiac and Carb modified diet  Activity: The patient is advised to gradually reintroduce usual activities, as tolerated  Discharge Condition: stable  Code Status: Full code   History of present illness: As per the H and P dictated on admission Hospital Course:  Connor Ross is a 58 y.o. male with medical history significant for type diabetes mellitus, hypertension and dyslipidemia, who presented to the emergency room with acute onset of fever with altered mental status  Patient was found to have tachycardia, tachypnea.  No leukocytosis.  Influenza was positive, blood culture was positive in 1 bottle with Streptococcus.  She was placed on Rocephin.  Patient also had elevated troponin peaked at 1226, seen by cardiology and placed on  heparin drip.  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 Given involvement of multiple vascular territories, consider an embolic etiology. No mass effect. CTA head and neck negative for LVO,  Multifocal nodular opacities RUL possibly septic emboli Continue DAPT aspirin Plavix, continue Crestor cardiology consulted for TEE , positive mitral valve vegetation 2X 0.7 cm  Continue antibiotics as per ID for 6 weeks  Neurology was consulted, recommended TEE and continue DAPT. Cardiology recommended to transfer under CT surgery for further management of mitral valve endocarditis.  Discussed with patient's wife who preferred transfer to [facility].  Patient got excepted to [facility] under cardiology service.  Accepting physician Dr. Manson Allan.    # NSTEMI (non-ST elevated myocardial infarction) Elevated troponin in the setting of influenza and septicemia.   Troponin peaked 1226, trended down, most likely has non-STEMI.  Cardiology consulted, s/p Hep gtt for 48 hours.  Continue DAPT.    # Acute hypoxic respiratory failure secondary to pneumonia, resolved # Sepsis due to pneumonia  # Influenza A + # Bacteremia, GB Strep agalactiae   # Left hand cellulitis.  Resolved Patient did meet sepsis criteria at time of admission with tachycardia and tachypnea.   Chest x-ray and CT scan also showed left lower lobe airspace disease consistent with pneumonia. Patient blood culture positive for Streptococcus agalactia.  The urine culture performed on 09/2022 also positive for Streptococcus agalactia.  Patient also has left hand swelling and red, states that he might had  a spider bite. x-ray negative for Fx. He has multiple reasons for bacteremia, but exact etiology is unclear.  Patient is symptom of the flu started last Wednesday, out of the window for Tamiflu. 2/12 repeat Bld Cx NGTD 2/13 started pantoprazole 40 mg p.o.  daily ID consulted, TEE which is positive for mitral valve endocarditis.  Patient will need antibiotics for 6 weeks. Continue Rocephin for now.  Patient is being transferred to [facility] to be seen by CT surgery.   # Hypokalemia, K repleted  # Hypophosphatemia, Phos repleted.  Resolved # Metabolic acidosis. Resolved, s/p bicarb drip. # BPH (benign prostatic hyperplasia) resumed Flomax. # Essential hypertension; continue antihypertensive therapy. # Type 2 diabetes mellitus with peripheral neuropathy: S/P basal insulin and sliding scale NovoLog given during hospital stay.  Patient was on Neurontin. # Morbid obesity with BMI 40.47.   Diet and excise advised. # Debility: Patient was severely debilitated at home, largely due to joint arthritis.  Obtain PT/OT.  Body mass index is 40.46 kg/m.  Nutrition Interventions:  - Patient was instructed, not to drive, operate heavy machinery, perform activities at heights, swimming or participation in water activities or provide baby sitting services while on Pain, Sleep and Anxiety Medications; until his outpatient Physician has advised to do so again.  - Also recommended to not to take more than prescribed Pain, Sleep and Anxiety Medications.  Patient was seen by physical therapy, who recommended Home health,  On the day of the discharge the patient's vitals were stable, and no other acute medical condition were reported by patient. the patient was felt safe to be transferred to [facility] under cardiology service.  Consultants: Cardiology, ID, neurology Procedures: TEE positive for mitral valve endocarditis  Discharge Exam: General: Appear in no distress, no Rash; Oral Mucosa Clear, moist. Cardiovascular: S1 and S2 Present, no Murmur, Respiratory: normal respiratory effort, Bilateral Air entry present and no Crackles, no wheezes Abdomen: Bowel Sound present, Soft and no tenderness, no hernia Extremities: no Pedal edema, no calf tenderness Neurology: alert and  oriented to time, place, and person affect appropriate.  Filed Weights   09/02/23 1320  Weight: 131.6 kg   Vitals:   09/02/23 1445 09/02/23 1606  BP: 130/74 117/62  Pulse:  94  Resp:  20  Temp:  99.6 F (37.6 C)  SpO2: 94% 95%    DISCHARGE MEDICATION: Allergies as of 09/02/2023       Reactions   Statins    Other Reaction(s): arthralgia (joint pain) Patient able to tolerate Crestor 10 mg   Hctz [hydrochlorothiazide] Other (See Comments)   Extreme Cramping   Metformin And Related Diarrhea        Medication List     STOP taking these medications    cefadroxil 500 MG capsule Commonly known as: DURICEF   clobetasol cream 0.05 % Commonly known as: TEMOVATE   furosemide 20 MG tablet Commonly known as: LASIX   ondansetron 4 MG disintegrating tablet Commonly known as: ZOFRAN-ODT       TAKE these medications    Aspirin Low Dose 81 MG tablet Generic drug: aspirin EC TAKE 1 TABLET BY MOUTH  DAILY (SWALLOW WHOLE)   blood glucose meter kit and supplies Dispense based on patient and insurance preference. Use up to four times daily as directed. (FOR ICD-10 E10.9, E11.9). Use to check blood sugar three times daily   cefTRIAXone 2 g in sodium chloride 0.9 % 100 mL Inject 2 g into the vein every 12 (twelve) hours.  clopidogrel 75 MG tablet Commonly known as: PLAVIX Take 75 mg by mouth daily.   cyanocobalamin 1000 MCG tablet Take 1 tablet (1,000 mcg total) by mouth daily. Start taking on: September 04, 2023   empagliflozin 25 MG Tabs tablet Commonly known as: Jardiance Take 1 tablet (25 mg total) by mouth daily.   fluticasone 50 MCG/ACT nasal spray Commonly known as: FLONASE PLACE 2 SPRAYS INTO BOTH NOSTRILS DAILY. AT NIGHT AFTER NASAL SALINE   freestyle lancets 1 each by Other route 2 (two) times daily. DX 250.02   FreeStyle Libre 2 Sensor Misc Use to check sugar at least 4 times daily   gabapentin 100 MG capsule Commonly known as: NEURONTIN TAKE 2  CAPSULES THREE TIMES DAILY   Insulin Pen Needle 32G X 6 MM Misc Use daily with insulin pen   ketoconazole 2 % cream Commonly known as: NIZORAL SMARTSIG:1 Application Topical 1 to 2 Times Daily   losartan 100 MG tablet Commonly known as: COZAAR TAKE 1 TABLET EVERY DAY   metoprolol tartrate 50 MG tablet Commonly known as: LOPRESSOR Take 1 tablet (50 mg total) by mouth 2 (two) times daily.   multivitamin tablet Take 1 tablet by mouth daily.   NovoLOG FlexPen 100 UNIT/ML FlexPen Generic drug: insulin aspart Inject up to 25 units with breakfast, 25 units with lunch, and 30 units with supper   OneTouch Verio test strip Generic drug: glucose blood USE TO CHECK BLOOD SUGAR 3  TIMES DAILY   Ozempic (2 MG/DOSE) 8 MG/3ML Sopn Generic drug: Semaglutide (2 MG/DOSE) Inject 2 mg into the skin once a week.   Rinvoq 15 MG Tb24 Generic drug: Upadacitinib ER Take 15 mg by mouth daily.   rosuvastatin 40 MG tablet Commonly known as: CRESTOR Take 1 tablet (40 mg total) by mouth every evening for 360 doses.   tamsulosin 0.4 MG Caps capsule Commonly known as: FLOMAX TAKE 1 CAPSULE EVERY DAY   traMADol 50 MG tablet Commonly known as: ULTRAM Take 1 tablet (50 mg total) by mouth every 6 (six) hours as needed.   Evaristo Bury FlexTouch 200 UNIT/ML FlexTouch Pen Generic drug: insulin degludec Inject 88 Units into the skin daily. What changed: how much to take   triamcinolone 0.025 % cream Commonly known as: KENALOG Apply topically.   Vitamin D (Ergocalciferol) 1.25 MG (50000 UNIT) Caps capsule Commonly known as: DRISDOL Take 50,000 Units by mouth once a week.       Allergies  Allergen Reactions   Statins     Other Reaction(s): arthralgia (joint pain) Patient able to tolerate Crestor 10 mg   Hctz [Hydrochlorothiazide] Other (See Comments)    Extreme Cramping   Metformin And Related Diarrhea   Discharge Instructions     Call MD for:  difficulty breathing, headache or visual  disturbances   Complete by: As directed    Call MD for:  extreme fatigue   Complete by: As directed    Call MD for:  persistant dizziness or light-headedness   Complete by: As directed    Call MD for:  persistant nausea and vomiting   Complete by: As directed    Call MD for:  severe uncontrolled pain   Complete by: As directed    Call MD for:  temperature >100.4   Complete by: As directed    Diet - low sodium heart healthy   Complete by: As directed    Discharge instructions   Complete by: As directed    Follow recommendation after discharge from  [facility], patient will need IV antibiotics for total 6 weeks due to endocarditis.  Further management as per [facility] cardiology.   Increase activity slowly   Complete by: As directed        The results of significant diagnostics from this hospitalization (including imaging, microbiology, ancillary and laboratory) are listed below for reference.    Significant Diagnostic Studies: CT ANGIO HEAD NECK W WO CM Result Date: 08/31/2023 CLINICAL DATA:  Neuro deficit, acute, stroke suspected multifocal embolic strokes - septic emboli? EXAM: CT ANGIOGRAPHY HEAD AND NECK WITH AND WITHOUT CONTRAST TECHNIQUE: Multidetector CT imaging of the head and neck was performed using the standard protocol during bolus administration of intravenous contrast. Multiplanar CT image reconstructions and MIPs were obtained to evaluate the vascular anatomy. Carotid stenosis measurements (when applicable) are obtained utilizing NASCET criteria, using the distal internal carotid diameter as the denominator. RADIATION DOSE REDUCTION: This exam was performed according to the departmental dose-optimization program which includes automated exposure control, adjustment of the mA and/or kV according to patient size and/or use of iterative reconstruction technique. CONTRAST:  75mL OMNIPAQUE IOHEXOL 350 MG/ML SOLN COMPARISON:  MRI head August 29, 2023. FINDINGS: CT HEAD FINDINGS Brain:  Numerous small infarcts better characterized on recent MRI. No progressive mass effect or acute hemorrhage. No mass lesion, midline shift or hydrocephalus. Vascular: See below. Skull: No acute fracture. Sinuses/Orbits: Clear sinuses.  No acute orbital findings. Other: No mastoid effusions. Review of the MIP images confirms the above findings CTA NECK FINDINGS Aortic arch: Great vessel origins are patent. No significant stenosis. Right carotid system: No evidence of dissection, stenosis (50% or greater), or occlusion. Left carotid system: No evidence of dissection, stenosis (50% or greater), or occlusion. Vertebral arteries: Right dominant. No evidence of dissection, stenosis (50% or greater), or occlusion. Skeleton: No acute fracture on limited assessment. Other neck: No acute abnormality on limited assessment. Upper chest: Multifocal nodular opacities in the right upper lobe. Review of the MIP images confirms the above findings CTA HEAD FINDINGS Anterior circulation: Bilateral intracranial ICAs, MCAs, and ACAs are patent without proximal hemodynamically significant stenosis. No aneurysm identified. Posterior circulation: Bilateral intradural vertebral arteries, basilar artery and bilateral posterior shoulders are patent without proximal hemodynamically significant stenosis. No aneurysm identified. Venous sinuses: As permitted by contrast timing, patent. Review of the MIP images confirms the above findings IMPRESSION: 1. No large vessel occlusion or proximal hemodynamically significant stenosis. 2. Multifocal nodular opacities in the right upper lobe, partially imaged and possibly representing septic emboli given the clinical history. Recommend CT of the chest (preferably with contrast) for further evaluation. Electronically Signed   By: Feliberto Harts M.D.   On: 08/31/2023 19:47   MR BRAIN W WO CONTRAST Result Date: 08/29/2023 CLINICAL DATA:  Mental status change, unknown cause EXAM: MRI HEAD WITHOUT AND WITH  CONTRAST TECHNIQUE: Multiplanar, multiecho pulse sequences of the brain and surrounding structures were obtained without and with intravenous contrast. CONTRAST:  10mL GADAVIST GADOBUTROL 1 MMOL/ML IV SOLN COMPARISON:  CT head August 25, 2023. 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 Mild associated edema without mass effect. No midline shift. No acute hemorrhage, mass lesion or hydrocephalus. Small remote lacunar infarcts in the thalami bilaterally. No pathologic enhancement. Vascular: Major arterial flow voids are maintained at the skull base. Skull and upper cervical spine: Normal marrow signal. Sinuses/Orbits: Clear sinuses.  No acute orbital findings. Other: No mastoid effusions. IMPRESSION: Numerous small acute infarcts  throughout the infratentorial and supratentorial brain. Given involvement of multiple vascular territories, consider an embolic etiology. No mass effect. These results will be called to the ordering clinician or representative by the Radiologist Assistant, and communication documented in the PACS or [employer]. Electronically Signed   By: Feliberto Harts M.D.   On: 08/29/2023 21:36   ECHOCARDIOGRAM COMPLETE Result Date: 08/27/2023    ECHOCARDIOGRAM REPORT   Patient Name:   Connor Ross [facility] Date of Exam: 08/27/2023 Medical Rec #:  [id number]             Height:       71.0 in Accession #:    [id number]            Weight:       290.2 lb Date of Birth:  04/19/1966             BSA:          2.470 m Patient Age:    57 years              BP:           129/71 mmHg Patient Gender: M                     HR:           84 bpm. Exam Location:  [facility] Procedure: 2D Echo, Cardiac Doppler, Color Doppler and Intracardiac            Opacification Agent (Both Spectral and Color Flow Doppler were            utilized during procedure). Indications:     NSTEMI  History:         Patient has prior history of  Echocardiogram examinations, most                  recent 06/24/2022. Acute MI and Previous Myocardial Infarction;                  Risk Factors:Hypertension, Diabetes and Sleep Apnea. Infulenza                  +.  Sonographer:     Mikki Harbor Referring Phys:  [id number] JAN A MANSY Diagnosing Phys: Alwyn Pea MD  Sonographer Comments: Technically difficult study due to poor echo windows and patient is obese. IMPRESSIONS  1. Left ventricular ejection fraction, by estimation, is 65 to 70%. The left ventricle has normal function. The left ventricle has no regional wall motion abnormalities. There is moderate concentric left ventricular hypertrophy. Left ventricular diastolic parameters are consistent with Grade II diastolic dysfunction (pseudonormalization).  2. Right ventricular systolic function is normal. The right ventricular size is normal.  3. The mitral valve is normal in structure. Trivial mitral valve regurgitation.  4. The aortic valve is normal in structure. Aortic valve regurgitation is not visualized. FINDINGS  Left Ventricle: Left ventricular ejection fraction, by estimation, is 65 to 70%. The left ventricle has normal function. The left ventricle has no regional wall motion abnormalities. Definity contrast agent was given IV to delineate the left ventricular  endocardial borders. Strain imaging was not performed. The left ventricular internal cavity size was normal in size. There is moderate concentric left ventricular hypertrophy. Left ventricular diastolic parameters are consistent with Grade II diastolic dysfunction (pseudonormalization). Right Ventricle: The right ventricular size is normal. No increase in right ventricular wall thickness. Right ventricular systolic function is normal. Left Atrium: Left atrial size was  normal in size. Right Atrium: Right atrial size was normal in size. Pericardium: There is no evidence of pericardial effusion. Mitral Valve: The mitral valve is normal in  structure. There is moderate thickening of the mitral valve leaflet(s). There is moderate calcification of the mitral valve leaflet(s). Normal mobility of the mitral valve leaflets. Trivial mitral valve regurgitation. MV peak gradient, 8.3 mmHg. The mean mitral valve gradient is 4.0 mmHg. Tricuspid Valve: The tricuspid valve is normal in structure. Tricuspid valve regurgitation is trivial. Aortic Valve: The aortic valve is normal in structure. Aortic valve regurgitation is not visualized. Aortic valve mean gradient measures 6.0 mmHg. Aortic valve peak gradient measures 13.0 mmHg. Aortic valve area, by VTI measures 2.53 cm. Pulmonic Valve: The pulmonic valve was normal in structure. Pulmonic valve regurgitation is not visualized. Aorta: The ascending aorta was not well visualized. IAS/Shunts: No atrial level shunt detected by color flow Doppler. Additional Comments: 3D imaging was not performed.  LEFT VENTRICLE PLAX 2D LVIDd:         4.90 cm   Diastology LVIDs:         2.90 cm   LV e' medial:    5.77 cm/s LV PW:         1.50 cm   LV E/e' medial:  22.9 LV IVS:        1.40 cm   LV e' lateral:   5.98 cm/s LVOT diam:     2.10 cm   LV E/e' lateral: 22.1 LV SV:         82 LV SV Index:   33 LVOT Area:     3.46 cm  LEFT ATRIUM              Index LA diam:        4.30 cm  1.74 cm/m LA Vol (A2C):   109.0 ml 44.13 ml/m LA Vol (A4C):   91.6 ml  37.09 ml/m LA Biplane Vol: 102.0 ml 41.30 ml/m  AORTIC VALVE                     PULMONIC VALVE AV Area (Vmax):    2.16 cm      PV Vmax:       1.01 m/s AV Area (Vmean):   2.39 cm      PV Peak grad:  4.1 mmHg AV Area (VTI):     2.53 cm AV Vmax:           180.00 cm/s AV Vmean:          113.000 cm/s AV VTI:            0.326 m AV Peak Grad:      13.0 mmHg AV Mean Grad:      6.0 mmHg LVOT Vmax:         112.00 cm/s LVOT Vmean:        78.100 cm/s LVOT VTI:          0.238 m LVOT/AV VTI ratio: 0.73  AORTA Ao Root diam: 4.10 cm MITRAL VALVE MV Area (PHT): 3.50 cm     SHUNTS MV Area VTI:    2.37 cm     Systemic VTI:  0.24 m MV Peak grad:  8.3 mmHg     Systemic Diam: 2.10 cm MV Mean grad:  4.0 mmHg MV Vmax:       1.44 m/s MV Vmean:      94.0 cm/s MV Decel Time: 217 msec MV E velocity: 132.00 cm/s  MV A velocity: 132.00 cm/s MV E/A ratio:  1.00 Alwyn Pea MD Electronically signed by Alwyn Pea MD Signature Date/Time: 08/27/2023/7:55:21 PM    Final    DG Hand Complete Left Result Date: 08/26/2023 CLINICAL DATA:  Left hand swelling. EXAM: LEFT HAND - COMPLETE 3+ VIEW COMPARISON:  None Available. FINDINGS: No fracture or bone lesion. Joints are normally spaced and aligned. Soft tissue swelling, most evident over the dorsal aspect of the hand and wrist. No soft tissue air. No radiopaque foreign body. IMPRESSION: 1. No fracture or acute finding. 2. Nonspecific soft tissue swelling. Electronically Signed   By: Amie Portland M.D.   On: 08/26/2023 15:26   CT ABDOMEN PELVIS W CONTRAST Result Date: 08/25/2023 CLINICAL DATA:  Fever, nausea/vomiting/diarrhea, abdominal pain, polyuria EXAM: CT ABDOMEN AND PELVIS WITH CONTRAST TECHNIQUE: Multidetector CT imaging of the abdomen and pelvis was performed using the standard protocol following bolus administration of intravenous contrast. RADIATION DOSE REDUCTION: This exam was performed according to the departmental dose-optimization program which includes automated exposure control, adjustment of the mA and/or kV according to patient size and/or use of iterative reconstruction technique. CONTRAST:  OMNIPAQUE IOHEXOL 350 MG/ML SOLN COMPARISON:  04/03/2012 FINDINGS: Lower chest: There is minimal subpleural right lower lobe airspace disease, which could reflect focal pneumonia. No effusion. Hepatobiliary: No focal liver abnormality is seen. No gallstones, gallbladder wall thickening, or biliary dilatation. Pancreas: Unremarkable. No pancreatic ductal dilatation or surrounding inflammatory changes. Spleen: Normal in size without focal abnormality.  Adrenals/Urinary Tract: The kidneys enhance normally and symmetrically. No urinary tract calculi or obstructive uropathy within either kidney. Bladder is minimally distended, with no filling defects. The adrenals are normal. Stomach/Bowel: No bowel obstruction or ileus. Normal appendix right lower quadrant. Scattered diverticulosis of the sigmoid colon without diverticulitis. No bowel wall thickening or inflammatory change. Vascular/Lymphatic: Aortic atherosclerosis. No enlarged abdominal or pelvic lymph nodes. Reproductive: Prostate is unremarkable. Other: No free fluid or free intraperitoneal gas. No abdominal wall hernia. Musculoskeletal: No acute or destructive bony abnormalities. Bilateral hip osteoarthritis, left greater than right. Reconstructed images demonstrate no additional findings. IMPRESSION: 1. Patchy subpleural right lower lobe airspace disease, consistent with focal bronchopneumonia. 2. No acute intra-abdominal or intrapelvic process. 3.  Aortic Atherosclerosis (ICD10-I70.0). Electronically Signed   By: Sharlet Salina M.D.   On: 08/25/2023 23:08   DG Chest Port 1 View Result Date: 08/25/2023 CLINICAL DATA:  Sepsis, abdominal pain, polyuria, diabetes EXAM: PORTABLE CHEST 1 VIEW COMPARISON:  06/23/2022 FINDINGS: Single frontal view of the chest demonstrates an enlarged cardiac silhouette. Lung volumes are diminished, with crowding of the central vasculature. No effusion or pneumothorax. No acute bony abnormalities. IMPRESSION: 1. Low lung volumes, with crowding of the pulmonary vasculature. 2. Enlarged cardiac silhouette. Electronically Signed   By: Sharlet Salina M.D.   On: 08/25/2023 22:45   CT Head Wo Contrast Result Date: 08/25/2023 CLINICAL DATA:  Fever. Mental status change of unknown cause. Diabetic. EXAM: CT HEAD WITHOUT CONTRAST TECHNIQUE: Contiguous axial images were obtained from the base of the skull through the vertex without intravenous contrast. RADIATION DOSE REDUCTION: This exam  was performed according to the departmental dose-optimization program which includes automated exposure control, adjustment of the mA and/or kV according to patient size and/or use of iterative reconstruction technique. COMPARISON:  None Available. FINDINGS: Brain: The study suffers from motion degradation. With repeat imaging, a satisfactory exam is achieved. No sign of old or acute focal infarction, mass lesion, hemorrhage, hydrocephalus or extra-axial collection. Vascular: There  is atherosclerotic calcification of the major vessels at the base of the brain. Skull: Negative Sinuses/Orbits: Apparent soft tissue swelling of the right posterior parietal scalp, presumed hematoma. Orbits negative. Other: None IMPRESSION: No acute intracranial finding. Apparent soft tissue swelling of the right posterior parietal scalp, presumed hematoma. Electronically Signed   By: Paulina Fusi M.D.   On: 08/25/2023 22:39    Microbiology: Recent Results (from the past 240 hours)  Resp panel by RT-PCR (RSV, Flu A&B, Covid) Anterior Nasal Swab     Status: Abnormal   Collection Time: 08/25/23  9:30 PM   Specimen: Anterior Nasal Swab  Result Value Ref Range Status   SARS Coronavirus 2 by RT PCR NEGATIVE NEGATIVE Final    Comment: (NOTE) SARS-CoV-2 target nucleic acids are NOT DETECTED.  The SARS-CoV-2 RNA is generally detectable in upper respiratory specimens during the acute phase of infection. The lowest concentration of SARS-CoV-2 viral copies this assay can detect is 138 copies/mL. A negative result does not preclude SARS-Cov-2 infection and should not be used as the sole basis for treatment or other patient management decisions. A negative result may occur with  improper specimen collection/handling, submission of specimen other than nasopharyngeal swab, presence of viral mutation(s) within the areas targeted by this assay, and inadequate number of viral copies(<138 copies/mL). A negative result must be combined  with clinical observations, patient history, and epidemiological information. The expected result is Negative.  Fact Sheet for Patients:  BloggerCourse.com  Fact Sheet for Healthcare Providers:  SeriousBroker.it  This test is no t yet approved or cleared by the [place] FDA and  has been authorized for detection and/or diagnosis of SARS-CoV-2 by FDA under an Emergency Use Authorization (EUA). This EUA will remain  in effect (meaning this test can be used) for the duration of the COVID-19 declaration under Section 564(b)(1) of the Act, 21 U.S.C.section 360bbb-3(b)(1), unless the authorization is terminated  or revoked sooner.       Influenza A by PCR POSITIVE (A) NEGATIVE Final   Influenza B by PCR NEGATIVE NEGATIVE Final    Comment: (NOTE) The Xpert Xpress SARS-CoV-2/FLU/RSV plus assay is intended as an aid in the diagnosis of influenza from Nasopharyngeal swab specimens and should not be used as a sole basis for treatment. Nasal washings and aspirates are unacceptable for Xpert Xpress SARS-CoV-2/FLU/RSV testing.  Fact Sheet for Patients: BloggerCourse.com  Fact Sheet for Healthcare Providers: SeriousBroker.it  This test is not yet approved or cleared by the [place] FDA and has been authorized for detection and/or diagnosis of SARS-CoV-2 by FDA under an Emergency Use Authorization (EUA). This EUA will remain in effect (meaning this test can be used) for the duration of the COVID-19 declaration under Section 564(b)(1) of the Act, 21 U.S.C. section 360bbb-3(b)(1), unless the authorization is terminated or revoked.     Resp Syncytial Virus by PCR NEGATIVE NEGATIVE Final    Comment: (NOTE) Fact Sheet for Patients: BloggerCourse.com  Fact Sheet for Healthcare Providers: SeriousBroker.it  This test is not yet  approved or cleared by the [place] FDA and has been authorized for detection and/or diagnosis of SARS-CoV-2 by FDA under an Emergency Use Authorization (EUA). This EUA will remain in effect (meaning this test can be used) for the duration of the COVID-19 declaration under Section 564(b)(1) of the Act, 21 U.S.C. section 360bbb-3(b)(1), unless the authorization is terminated or revoked.  Performed at [facility], [address] [place], [place] [address]   Blood Culture (routine x  2)     Status: Abnormal   Collection Time: 08/25/23  9:31 PM   Specimen: BLOOD LEFT FOREARM  Result Value Ref Range Status   Specimen Description   Final    BLOOD LEFT FOREARM Performed at [facility], [address] [place], [place] [address]    Special Requests   Final    BOTTLES DRAWN AEROBIC AND ANAEROBIC Blood Culture results may not be optimal due to an inadequate volume of blood received in culture bottles Performed at [facility], [address], [place], [place] [address]    Culture  Setup Time   Final    GRAM POSITIVE COCCI IN BOTH AEROBIC AND ANAEROBIC BOTTLES Organism ID to follow CRITICAL RESULT CALLED TO, READ BACK BY AND VERIFIED WITH: KLUTZ, L. 0849 08/26/2023 LRL GRAM STAIN REVIEWED-AGREE WITH RESULT DRT Performed at [facility], [address], [place], [place] [address]    Culture GROUP B STREP(S.AGALACTIAE)ISOLATED (A)  Final   Report Status 08/28/2023 FINAL  Final   Organism ID, Bacteria GROUP B STREP(S.AGALACTIAE)ISOLATED  Final      Susceptibility   Group b strep(s.agalactiae)isolated - MIC*    CLINDAMYCIN >=1 RESISTANT Resistant     AMPICILLIN <=0.25 SENSITIVE Sensitive     ERYTHROMYCIN >=8 RESISTANT Resistant     VANCOMYCIN <=0.12 SENSITIVE Sensitive     CEFTRIAXONE <=0.12 SENSITIVE Sensitive     LEVOFLOXACIN 0.5 SENSITIVE Sensitive     PENICILLIN <=0.06 SENSITIVE Sensitive     * GROUP B STREP(S.AGALACTIAE)ISOLATED  Blood  Culture (routine x 2)     Status: Abnormal   Collection Time: 08/25/23  9:31 PM   Specimen: BLOOD LEFT FOREARM  Result Value Ref Range Status   Specimen Description   Final    BLOOD LEFT FOREARM Performed at [facility], [address], [place], [place] [address]    Special Requests   Final    BOTTLES DRAWN AEROBIC AND ANAEROBIC Blood Culture results may not be optimal due to an inadequate volume of blood received in culture bottles Performed at [facility], [address] [place], [place] [address]    Culture  Setup Time   Final    GRAM POSITIVE COCCI IN BOTH AEROBIC AND ANAEROBIC BOTTLES Organism ID to follow CRITICAL RESULT CALLED TO, READ BACK BY AND VERIFIED WITH: KLUTZ, L. [id number] 08/26/2023 LRL GRAM STAIN REVIEWED-AGREE WITH RESULT DRT    Culture (A)  Final    GROUP B STREP(S.AGALACTIAE)ISOLATED SUSCEPTIBILITIES PERFORMED ON PREVIOUS CULTURE WITHIN THE LAST 5 DAYS. Performed at [facility], [address] [place] [address], [place], [place] [address]    Report Status 08/28/2023 FINAL  Final  Blood Culture ID Panel (Reflexed)     Status: Abnormal   Collection Time: 08/25/23  9:31 PM  Result Value Ref Range Status   Enterococcus faecalis NOT DETECTED NOT DETECTED Final   Enterococcus Faecium NOT DETECTED NOT DETECTED Final   Listeria monocytogenes NOT DETECTED NOT DETECTED Final   Staphylococcus species NOT DETECTED NOT DETECTED Final   Staphylococcus aureus (BCID) NOT DETECTED NOT DETECTED Final   Staphylococcus epidermidis NOT DETECTED NOT DETECTED Final   Staphylococcus lugdunensis NOT DETECTED NOT DETECTED Final   Streptococcus species DETECTED (A) NOT DETECTED Final    Comment: CRITICAL RESULT CALLED TO, READ BACK BY AND VERIFIED WITH: KLUTZ, L. 0849 08/26/2023 LRL    Streptococcus agalactiae DETECTED (A) NOT DETECTED Final    Comment: CRITICAL RESULT CALLED TO, READ BACK BY AND VERIFIED WITH:  KLUTZ, L. 0849 08/26/2023 LRL    Streptococcus pneumoniae NOT  DETECTED NOT DETECTED Final   Streptococcus pyogenes NOT DETECTED NOT DETECTED Final   A.calcoaceticus-baumannii NOT DETECTED NOT DETECTED Final   Bacteroides fragilis NOT DETECTED NOT DETECTED Final   Enterobacterales NOT DETECTED NOT DETECTED Final   Enterobacter cloacae complex NOT DETECTED NOT DETECTED Final   Escherichia coli NOT DETECTED NOT DETECTED Final   Klebsiella aerogenes NOT DETECTED NOT DETECTED Final   Klebsiella oxytoca NOT DETECTED NOT DETECTED Final   Klebsiella pneumoniae NOT DETECTED NOT DETECTED Final   Proteus species NOT DETECTED NOT DETECTED Final   Salmonella species NOT DETECTED NOT DETECTED Final   Serratia marcescens NOT DETECTED NOT DETECTED Final   Haemophilus influenzae NOT DETECTED NOT DETECTED Final   Neisseria meningitidis NOT DETECTED NOT DETECTED Final   Pseudomonas aeruginosa NOT DETECTED NOT DETECTED Final   Stenotrophomonas maltophilia NOT DETECTED NOT DETECTED Final   Candida albicans NOT DETECTED NOT DETECTED Final   Candida auris NOT DETECTED NOT DETECTED Final   Candida glabrata NOT DETECTED NOT DETECTED Final   Candida krusei NOT DETECTED NOT DETECTED Final   Candida parapsilosis NOT DETECTED NOT DETECTED Final   Candida tropicalis NOT DETECTED NOT DETECTED Final   Cryptococcus neoformans/gattii NOT DETECTED NOT DETECTED Final    Comment: Performed at [facility], [address] [place], [place] [address]  C Difficile Quick Screen w PCR reflex     Status: None   Collection Time: 08/26/23  2:46 PM   Specimen: STOOL  Result Value Ref Range Status   C Diff antigen NEGATIVE NEGATIVE Final   C Diff toxin NEGATIVE NEGATIVE Final   C Diff interpretation No C. difficile detected.  Final    Comment: Performed at [facility], [address] [place], [place] [address]  Culture, blood (Routine X 2) w Reflex to ID Panel     Status: None   Collection Time: 08/27/23  2:10 PM   Specimen: BLOOD  Result Value Ref Range Status    Specimen Description BLOOD BLOOD RIGHT ARM  Final   Special Requests   Final    BOTTLES DRAWN AEROBIC AND ANAEROBIC Blood Culture results may not be optimal due to an inadequate volume of blood received in culture bottles   Culture   Final    NO GROWTH 5 DAYS Performed at [facility], [address] [place], [place] [address]    Report Status 09/01/2023 FINAL  Final  Culture, blood (Routine X 2) w Reflex to ID Panel     Status: None   Collection Time: 08/27/23  2:13 PM   Specimen: BLOOD  Result Value Ref Range Status   Specimen Description BLOOD BLOOD RIGHT HAND  Final   Special Requests   Final    BOTTLES DRAWN AEROBIC ONLY Blood Culture results may not be optimal due to an inadequate volume of blood received in culture bottles   Culture   Final    NO GROWTH 5 DAYS Performed at [facility], [address] [place], [place] [address]    Report Status 09/01/2023 FINAL  Final  Gastrointestinal Panel by PCR , Stool     Status: None   Collection Time: 08/28/23  1:29 AM   Specimen: Stool  Result Value Ref Range Status   Campylobacter species NOT DETECTED NOT DETECTED Final   Plesimonas shigelloides NOT DETECTED NOT DETECTED Final   Salmonella species NOT DETECTED NOT DETECTED Final   Yersinia enterocolitica NOT  DETECTED NOT DETECTED Final   Vibrio species NOT DETECTED NOT DETECTED Final   Vibrio cholerae NOT DETECTED NOT DETECTED Final   Enteroaggregative E coli (EAEC) NOT DETECTED NOT DETECTED Final   Enteropathogenic E coli (EPEC) NOT DETECTED NOT DETECTED Final   Enterotoxigenic E coli (ETEC) NOT DETECTED NOT DETECTED Final   Shiga like toxin producing E coli (STEC) NOT DETECTED NOT DETECTED Final   Shigella/Enteroinvasive E coli (EIEC) NOT DETECTED NOT DETECTED Final   Cryptosporidium NOT DETECTED NOT DETECTED Final   Cyclospora cayetanensis NOT DETECTED NOT DETECTED Final   Entamoeba histolytica NOT DETECTED NOT DETECTED Final   Giardia lamblia NOT  DETECTED NOT DETECTED Final   Adenovirus F40/41 NOT DETECTED NOT DETECTED Final   Astrovirus NOT DETECTED NOT DETECTED Final   Norovirus GI/GII NOT DETECTED NOT DETECTED Final   Rotavirus A NOT DETECTED NOT DETECTED Final   Sapovirus (I, II, IV, and V) NOT DETECTED NOT DETECTED Final    Comment: Performed at [facility], [address] [place], [place] [address]     Labs: CBC: Recent Labs  Lab 08/29/23 [phone] 08/30/23 0428 08/31/23 0312 09/01/23 0331 09/02/23 0610  WBC 10.0 10.4 13.5* 12.7* 11.2*  HGB 13.1 12.9* 13.7 14.1 13.4  HCT 38.4* 37.6* 40.7 41.1 39.5  MCV 89.1 89.3 90.8 89.5 90.4  PLT 176 231 293 322 300   Basic Metabolic Panel: Recent Labs  Lab 08/27/23 0251 08/27/23 1409 08/28/23 0216 08/29/23 1308 08/30/23 0428 08/31/23 0312 09/01/23 0331 09/02/23 0610  NA 135   < > 133* 135 134* 135 136 134*  K 2.7*   < > 3.3* 3.5 3.5 4.3 3.9 4.4  CL 96*   < > 97* 100 101 99 99 101  CO2 26   < > 23 20* 24 24 22 24   GLUCOSE 153*   < > 152* 137* 220* 169* 193* 235*  BUN 13   < > 13 18 19 20 18 17   CREATININE 0.69   < > 0.57* 0.62 0.65 0.77 0.69 0.76  CALCIUM 8.9   < > 8.7* 8.5* 8.2* 8.7* 8.5* 8.3*  MG 2.3  --  2.1 2.3 2.3  --   --   --   PHOS 3.5  --  2.3* 4.3 3.0  --   --   --    < > = values in this interval not displayed.   Liver Function Tests: No results for input(s): "AST", "ALT", "ALKPHOS", "BILITOT", "PROT", "ALBUMIN" in the last 168 hours. No results for input(s): "LIPASE", "AMYLASE" in the last 168 hours. No results for input(s): "AMMONIA" in the last 168 hours. Cardiac Enzymes: No results for input(s): "CKTOTAL", "CKMB", "CKMBINDEX", "TROPONINI" in the last 168 hours. BNP (last 3 results) No results for input(s): "BNP" in the last 8760 hours. CBG: Recent Labs  Lab 09/02/23 1315  GLUCAP 214*    Time spent: 35 minutes  Signed:  Gillis Santa  [facility] 09/02/2023 4:45 PM

## 2023-09-02 NOTE — Progress Notes (Signed)
 Occupational Therapy Treatment Patient Details Name: Connor Ross MRN: [id number] DOB: Jun 20, 1966 Today's Date: 09/02/2023   History of present illness Pt is a 58 y.o. male presenting to hospital 08/25/23 with c/o fever and AMS; also N/V/D.  PMH includes DM, htn, diabetic ulcer L toe, lumbar disc surgery.  Pt admitted with NSTEMI, sepsis d/t PNA, (+) influenza A, hypokalemia, metabolic acidosis, L hand cellulitis.   OT comments  Pt is supine in bed on arrival. Pleasant and agreeable to OT session. He denies pain. Pt performed bed mobility with SUP and STS from EOB to RW with SUP. He ambulated within the room using RW, as well as to stand holding grab bar to urinate at toilet, stand at sink to perform LB bathing and peri-care d/t urinated on himself all with CGA/SBA. Needs Min/Mod A for LB dressing to don socks, pt able to doff them. Reports his wife assists with LB dressing at home. HR up to 115 at most during session, pt with improving cognition able to state date, location. He was left seated in recliner with all needs in place and will cont to require skilled acute OT services to maximize his safety and IND to return to PLOF.       If plan is discharge home, recommend the following:  Assistance with cooking/housework;Assist for transportation;Direct supervision/assist for financial management;Supervision due to cognitive status;Direct supervision/assist for medications management;Help with stairs or ramp for entrance;A little help with bathing/dressing/bathroom;A little help with walking and/or transfers   Equipment Recommendations       Recommendations for Other Services      Precautions / Restrictions Precautions Precautions: Fall Restrictions Weight Bearing Restrictions Per Provider Order: No       Mobility Bed Mobility Overal bed mobility: Needs Assistance Bed Mobility: Supine to Sit     Supine to sit: Supervision, HOB elevated          Transfers Overall  transfer level: Needs assistance Equipment used: Rolling walker (2 wheels) Transfers: Sit to/from Stand             General transfer comment: SUP for STS from EOB and CGA for in room mobility using RW     Balance Overall balance assessment: Needs assistance Sitting-balance support: No upper extremity supported, Feet supported Sitting balance-Leahy Scale: Good     Standing balance support: Bilateral upper extremity supported, During functional activity, Reliant on assistive device for balance Standing balance-Leahy Scale: Good Standing balance comment: steady ambulating with RW use and uniltaral support at sink for LB bathing/peri-care                           ADL either performed or assessed with clinical judgement   ADL Overall ADL's : Needs assistance/impaired     Grooming: Wash/dry face;Standing;Supervision/safety       Lower Body Bathing: Contact guard assist;Sit to/from stand Lower Body Bathing Details (indicate cue type and reason): standing at sink with CGA/SBA during LB bathing/peri-care Upper Body Dressing : Sitting;Contact guard assist Upper Body Dressing Details (indicate cue type and reason): to doff/don gown around tele     Toilet Transfer: Supervision/safety Toilet Transfer Details (indicate cue type and reason): to stand to urinate holding to grab bar         Functional mobility during ADLs: Contact guard assist;Rolling walker (2 wheels)      Extremity/Trunk Assessment              Vision  Perception     Praxis     Communication Communication Communication: No apparent difficulties   Cognition Arousal: Alert Behavior During Therapy: WFL for tasks assessed/performed                                 Following commands: Impaired Following commands impaired: Follows multi-step commands with increased time      Cueing   Cueing Techniques: Verbal cues  Exercises      Shoulder Instructions        General Comments HR up to 115 at most during session    Pertinent Vitals/ Pain       Pain Assessment Pain Assessment: No/denies pain Pain Intervention(s): Monitored during session  Home Living                                          Prior Functioning/Environment              Frequency  Min 1X/week        Progress Toward Goals  OT Goals(current goals can now be found in the care plan section)  Progress towards OT goals: Progressing toward goals  Acute Rehab OT Goals Patient Stated Goal: return home OT Goal Formulation: With patient Time For Goal Achievement: 09/11/23 Potential to Achieve Goals: Fair  Plan      Co-evaluation                 AM-PAC OT "6 Clicks" Daily Activity     Outcome Measure   Help from another person eating meals?: None Help from another person taking care of personal grooming?: None Help from another person toileting, which includes using toliet, bedpan, or urinal?: A Little Help from another person bathing (including washing, rinsing, drying)?: A Little Help from another person to put on and taking off regular upper body clothing?: A Little Help from another person to put on and taking off regular lower body clothing?: A Little 6 Click Score: 20    End of Session Equipment Utilized During Treatment: Gait belt;Rolling walker (2 wheels)  OT Visit Diagnosis: Other abnormalities of gait and mobility (R26.89);Unsteadiness on feet (R26.81);Muscle weakness (generalized) (M62.81)   Activity Tolerance Patient tolerated treatment well   Patient Left with call bell/phone within reach;in chair;with chair alarm set;with nursing/sitter in room   Nurse Communication Mobility status        Time: 1610-9604 OT Time Calculation (min): 25 min  Charges: OT General Charges $OT Visit: 1 Visit OT Treatments $Self Care/Home Management : 23-37 mins  Dayten Juba, OTR/L  09/02/23, 12:17 PM   Carman Essick E  Kaizen Ibsen 09/02/2023, 12:14 PM

## 2023-09-02 NOTE — Plan of Care (Signed)
  Problem: Education: Goal: Understanding of cardiac disease, CV risk reduction, and recovery process will improve Outcome: Progressing   Problem: Activity: Goal: Ability to tolerate increased activity will improve Outcome: Progressing   Problem: Cardiac: Goal: Ability to achieve and maintain adequate cardiovascular perfusion will improve Outcome: Progressing   Problem: Health Behavior/Discharge Planning: Goal: Ability to safely manage health-related needs after discharge will improve Outcome: Progressing   Problem: Education: Goal: Knowledge of General Education information will improve Description: Including pain rating scale, medication(s)/side effects and non-pharmacologic comfort measures Outcome: Progressing   Problem: Clinical Measurements: Goal: Ability to maintain clinical measurements within normal limits will improve Outcome: Progressing Goal: Will remain free from infection Outcome: Progressing Goal: Diagnostic test results will improve Outcome: Progressing Goal: Respiratory complications will improve Outcome: Progressing Goal: Cardiovascular complication will be avoided Outcome: Progressing   Problem: Activity: Goal: Risk for activity intolerance will decrease Outcome: Progressing   Problem: Nutrition: Goal: Adequate nutrition will be maintained Outcome: Progressing   Problem: Coping: Goal: Level of anxiety will decrease Outcome: Progressing   Problem: Pain Managment: Goal: General experience of comfort will improve and/or be controlled Outcome: Progressing   Problem: Safety: Goal: Ability to remain free from injury will improve Outcome: Progressing

## 2023-09-03 ENCOUNTER — Encounter: Payer: Self-pay | Admitting: Cardiology

## 2023-09-03 NOTE — Anesthesia Postprocedure Evaluation (Signed)
 Anesthesia Post Note  Patient: Connor Ross  Procedure(s) Performed: TRANSESOPHAGEAL ECHOCARDIOGRAM (TEE) (Right)  Anesthesia Type: General Anesthetic complications: no   No notable events documented.   Last Vitals:  Vitals:   09/02/23 1746 09/02/23 1800  BP: 121/64 121/64  Pulse: 98 98  Resp: 20 20  Temp: 36.8 C 36.8 C  SpO2: 97% 97%    Last Pain:  Vitals:   09/02/23 1800  TempSrc:   PainSc: 0-No pain                 Louie Boston

## 2023-09-10 HISTORY — PX: CARDIAC VALVE REPLACEMENT: SHX585

## 2023-09-12 ENCOUNTER — Telehealth: Payer: Self-pay

## 2023-09-12 NOTE — Telephone Encounter (Signed)
 Copied from CRM [id number]. Topic: General - Other >> Sep 12, 2023  3:35 PM Rodman Pickle T wrote: Reason for CRM: [facility]   Claris Gladden   [phone]     is needing to know if the dr will sign off on the home health care order

## 2023-09-12 NOTE — Telephone Encounter (Signed)
 Copied from CRM [id number]. Topic: Appointments - Appointment Scheduling >> Sep 12, 2023  2:19 PM Irine Seal wrote: Please assist in scheduling a hospital follow up appointment for the patient to check his INR levels after discharge from [facility]. The PCP is unavailable until March 12, as well as all other providers which is outside the required 14-day follow-up window. The patient needs to have his INR checked after coumadin   [facility] callback [phone]  Please let us know how Dr. Duncan Dull would like for [place] to schedule patient.

## 2023-09-15 NOTE — Telephone Encounter (Signed)
 Is it okay to schedule pt this Thursday at 12:30 pm?

## 2023-09-15 NOTE — Telephone Encounter (Signed)
 Spoke with pt to schedule a hospital follow up. Pt stated that he is still in the hospital and isn't sure when he will be discharged. Pt was advised that when he does get discharged to please give our office a call so we can get him scheduled for a hospital follow up. Pt gave a verbal understanding.

## 2023-09-16 ENCOUNTER — Inpatient Hospital Stay: Payer: Medicare HMO | Admitting: Nurse Practitioner

## 2023-09-16 NOTE — Telephone Encounter (Signed)
 LMTCB with Connor Ross

## 2023-09-17 DIAGNOSIS — Z952 Presence of prosthetic heart valve: Secondary | ICD-10-CM | POA: Diagnosis not present

## 2023-09-17 DIAGNOSIS — I059 Rheumatic mitral valve disease, unspecified: Secondary | ICD-10-CM | POA: Diagnosis not present

## 2023-09-19 ENCOUNTER — Other Ambulatory Visit

## 2023-09-19 DIAGNOSIS — E1159 Type 2 diabetes mellitus with other circulatory complications: Secondary | ICD-10-CM | POA: Diagnosis not present

## 2023-09-19 DIAGNOSIS — R7881 Bacteremia: Secondary | ICD-10-CM | POA: Diagnosis not present

## 2023-09-19 DIAGNOSIS — Z452 Encounter for adjustment and management of vascular access device: Secondary | ICD-10-CM | POA: Diagnosis not present

## 2023-09-19 DIAGNOSIS — Z48812 Encounter for surgical aftercare following surgery on the circulatory system: Secondary | ICD-10-CM | POA: Diagnosis not present

## 2023-09-19 DIAGNOSIS — I4891 Unspecified atrial fibrillation: Secondary | ICD-10-CM | POA: Diagnosis not present

## 2023-09-19 DIAGNOSIS — I76 Septic arterial embolism: Secondary | ICD-10-CM | POA: Diagnosis not present

## 2023-09-19 DIAGNOSIS — B954 Other streptococcus as the cause of diseases classified elsewhere: Secondary | ICD-10-CM | POA: Diagnosis not present

## 2023-09-19 DIAGNOSIS — Z952 Presence of prosthetic heart valve: Secondary | ICD-10-CM | POA: Diagnosis not present

## 2023-09-19 DIAGNOSIS — I059 Rheumatic mitral valve disease, unspecified: Secondary | ICD-10-CM | POA: Diagnosis not present

## 2023-09-19 DIAGNOSIS — I152 Hypertension secondary to endocrine disorders: Secondary | ICD-10-CM | POA: Diagnosis not present

## 2023-09-19 NOTE — Addendum Note (Signed)
 Addended by: Jarvis Morgan D on: 09/19/2023 02:23 PM   Modules accepted: Orders

## 2023-09-19 NOTE — Addendum Note (Signed)
 Addended by: Jarvis Morgan D on: 09/19/2023 02:24 PM   Modules accepted: Orders

## 2023-09-22 DIAGNOSIS — R791 Abnormal coagulation profile: Secondary | ICD-10-CM | POA: Diagnosis not present

## 2023-09-22 DIAGNOSIS — I76 Septic arterial embolism: Secondary | ICD-10-CM | POA: Diagnosis not present

## 2023-09-22 DIAGNOSIS — Z452 Encounter for adjustment and management of vascular access device: Secondary | ICD-10-CM | POA: Diagnosis not present

## 2023-09-22 DIAGNOSIS — R7881 Bacteremia: Secondary | ICD-10-CM | POA: Diagnosis not present

## 2023-09-22 DIAGNOSIS — I059 Rheumatic mitral valve disease, unspecified: Secondary | ICD-10-CM | POA: Diagnosis not present

## 2023-09-22 DIAGNOSIS — I330 Acute and subacute infective endocarditis: Secondary | ICD-10-CM | POA: Diagnosis not present

## 2023-09-22 DIAGNOSIS — E1159 Type 2 diabetes mellitus with other circulatory complications: Secondary | ICD-10-CM | POA: Diagnosis not present

## 2023-09-22 DIAGNOSIS — I4891 Unspecified atrial fibrillation: Secondary | ICD-10-CM | POA: Diagnosis not present

## 2023-09-22 DIAGNOSIS — B954 Other streptococcus as the cause of diseases classified elsewhere: Secondary | ICD-10-CM | POA: Diagnosis not present

## 2023-09-22 DIAGNOSIS — I152 Hypertension secondary to endocrine disorders: Secondary | ICD-10-CM | POA: Diagnosis not present

## 2023-09-22 DIAGNOSIS — I39 Endocarditis and heart valve disorders in diseases classified elsewhere: Secondary | ICD-10-CM | POA: Diagnosis not present

## 2023-09-22 DIAGNOSIS — Z48812 Encounter for surgical aftercare following surgery on the circulatory system: Secondary | ICD-10-CM | POA: Diagnosis not present

## 2023-09-23 ENCOUNTER — Telehealth: Payer: Self-pay

## 2023-09-23 DIAGNOSIS — I059 Rheumatic mitral valve disease, unspecified: Secondary | ICD-10-CM | POA: Diagnosis not present

## 2023-09-23 DIAGNOSIS — I38 Endocarditis, valve unspecified: Secondary | ICD-10-CM

## 2023-09-23 DIAGNOSIS — I76 Septic arterial embolism: Secondary | ICD-10-CM | POA: Diagnosis not present

## 2023-09-23 DIAGNOSIS — I152 Hypertension secondary to endocrine disorders: Secondary | ICD-10-CM | POA: Diagnosis not present

## 2023-09-23 DIAGNOSIS — Z5181 Encounter for therapeutic drug level monitoring: Secondary | ICD-10-CM | POA: Insufficient documentation

## 2023-09-23 DIAGNOSIS — E1159 Type 2 diabetes mellitus with other circulatory complications: Secondary | ICD-10-CM | POA: Diagnosis not present

## 2023-09-23 DIAGNOSIS — I4891 Unspecified atrial fibrillation: Secondary | ICD-10-CM | POA: Diagnosis not present

## 2023-09-23 DIAGNOSIS — R7881 Bacteremia: Secondary | ICD-10-CM | POA: Diagnosis not present

## 2023-09-23 DIAGNOSIS — Z452 Encounter for adjustment and management of vascular access device: Secondary | ICD-10-CM | POA: Diagnosis not present

## 2023-09-23 DIAGNOSIS — B954 Other streptococcus as the cause of diseases classified elsewhere: Secondary | ICD-10-CM | POA: Diagnosis not present

## 2023-09-23 DIAGNOSIS — Z48812 Encounter for surgical aftercare following surgery on the circulatory system: Secondary | ICD-10-CM | POA: Diagnosis not present

## 2023-09-23 HISTORY — DX: Endocarditis, valve unspecified: I38

## 2023-09-23 NOTE — Progress Notes (Signed)
 Subjective:  Patient ID: Connor Ross, male    DOB: [birth year]  Age: 58 y.o. MRN: [id number]  CC: There were no encounter diagnoses.   HPI Connor Ross presents for No chief complaint on file.  Hospital follow up Patient initially presented to [facility] on 2/10 with generalized vomiting, fever, and AMS. He was found to have the flu and potential concurrent bacterial pneumonia. 1/2 blood cultures showed strep agalactiae and patient was started on ceftriaxone and azithromycin for bacteremia and c/f concurrent bacterial pneumonia and Tamiflu for influenza.. Source of bacteremia most likely d/t POC sticks per ID. During hospitalization, troponins were incidentally found to be elevated and TTE was ordered which had normal EF, no wall motion abnormalities, moderate LVH, grade 2 diastolic dysfunction. Received heparin gtt for 48-72 hours + ASA load and now on DAPT. 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 Neurology consulted recommending continuing DAPT. Transferred to [facility] for CT surgery evaluation for mitral valve vegetation.      Outpatient Medications Prior to Visit  Medication Sig Dispense Refill   ASPIRIN LOW DOSE 81 MG EC tablet TAKE 1 TABLET BY MOUTH  DAILY (SWALLOW WHOLE) 90 tablet 1   blood glucose meter kit and supplies Dispense based on patient and insurance preference. Use up to four times daily as directed. (FOR ICD-10 E10.9, E11.9). Use to check blood sugar three times daily 1 each 0   cefTRIAXone 2 g in sodium chloride 0.9 % 100 mL Inject 2 g into the vein every 12 (twelve) hours.     clopidogrel (PLAVIX) 75 MG tablet Take 75 mg by mouth daily. (Patient not taking: Reported on 08/26/2023)     Continuous Blood Gluc Sensor  (FREESTYLE LIBRE 2 SENSOR) MISC Use to check sugar at least 4 times daily 1 each 2   cyanocobalamin 1000 MCG tablet Take 1 tablet (1,000 mcg total) by mouth daily.     empagliflozin (JARDIANCE) 25 MG TABS tablet Take 1 tablet (25 mg total) by mouth daily. 90 tablet 3   fluticasone (FLONASE) 50 MCG/ACT nasal spray PLACE 2 SPRAYS INTO BOTH NOSTRILS DAILY. AT NIGHT AFTER NASAL SALINE 16 g 2   gabapentin (NEURONTIN) 100 MG capsule TAKE 2 CAPSULES THREE TIMES DAILY 540 capsule 3   insulin aspart (NOVOLOG FLEXPEN) 100 UNIT/ML FlexPen Inject up to 25 units with breakfast, 25 units with lunch, and 30 units with supper 72 mL 2   insulin degludec (TRESIBA FLEXTOUCH) 200 UNIT/ML FlexTouch Pen Inject 88 Units into the skin daily. (Patient taking differently: Inject 86 Units into the skin daily.) 48 mL 2   Insulin Pen Needle 32G X 6 MM MISC Use daily with insulin pen 50 each 0   ketoconazole (NIZORAL) 2 % cream SMARTSIG:1 Application Topical 1 to 2 Times Daily     Lancets (FREESTYLE) lancets 1 each by Other route 2 (two) times daily. DX 250.02 100 each 12   losartan (COZAAR) 100 MG tablet TAKE 1 TABLET EVERY DAY 90 tablet 3   metoprolol tartrate (LOPRESSOR) 50 MG tablet Take 1 tablet (50 mg total) by mouth 2 (two) times daily. (Patient not taking: Reported on 08/26/2023) 180 tablet 1   Multiple Vitamin (MULTIVITAMIN) tablet Take 1 tablet by mouth daily.     ONETOUCH VERIO test strip USE TO CHECK BLOOD  SUGAR 3  TIMES DAILY 300 strip 3   rosuvastatin (CRESTOR) 40 MG tablet Take 1 tablet (40 mg total) by mouth every evening for 360 doses. (Patient not taking: Reported on 08/26/2023) 90 tablet 3   Semaglutide, 2 MG/DOSE, (OZEMPIC, 2 MG/DOSE,) 8 MG/3ML SOPN Inject 2 mg into the skin once a week. (Patient not taking: Reported on 08/26/2023) 8 mL 2   tamsulosin (FLOMAX) 0.4 MG CAPS capsule TAKE 1 CAPSULE EVERY DAY 90 capsule 3   traMADol (ULTRAM) 50 MG tablet Take 1 tablet (50 mg total) by mouth every 6 (six) hours as  needed. 360 tablet 1   triamcinolone (KENALOG) 0.025 % cream Apply topically.     Upadacitinib ER (RINVOQ) 15 MG TB24 Take 15 mg by mouth daily.     Vitamin D, Ergocalciferol, (DRISDOL) 1.25 MG (50000 UNIT) CAPS capsule Take 50,000 Units by mouth once a week.     No facility-administered medications prior to visit.    Review of Systems;  Patient denies headache, fevers, malaise, unintentional weight loss, skin rash, eye pain, sinus congestion and sinus pain, sore throat, dysphagia,  hemoptysis , cough, dyspnea, wheezing, chest pain, palpitations, orthopnea, edema, abdominal pain, nausea, melena, diarrhea, constipation, flank pain, dysuria, hematuria, urinary  Frequency, nocturia, numbness, tingling, seizures,  Focal weakness, Loss of consciousness,  Tremor, insomnia, depression, anxiety, and suicidal ideation.      Objective:  There were no vitals taken for this visit.  BP Readings from Last 3 Encounters:  09/02/23 121/64  08/25/23 120/66  07/04/23 (!) 152/70    Wt Readings from Last 3 Encounters:  09/02/23 290 lb 2 oz (131.6 kg)  08/25/23 290 lb 3.2 oz (131.6 kg)  07/04/23 (!) 304 lb (137.9 kg)    Physical Exam  Lab Results  Component Value Date   HGBA1C 5.9 07/04/2023   HGBA1C 7.9 (H) 07/02/2023   HGBA1C 6.1 04/01/2023    Lab Results  Component Value Date   CREATININE 0.76 09/02/2023   CREATININE 0.69 09/01/2023   CREATININE 0.77 08/31/2023    Lab Results  Component Value Date   WBC 11.2 (H) 09/02/2023   HGB 13.4 09/02/2023   HCT 39.5 09/02/2023   PLT 300 09/02/2023   GLUCOSE 235 (H) 09/02/2023   CHOL 73 08/26/2023   TRIG 83 08/26/2023   HDL 17 (L) 08/26/2023   LDLDIRECT 48.0 07/02/2023   LDLCALC 39 08/26/2023   ALT 33 08/25/2023   AST 22 08/25/2023   NA 134 (L) 09/02/2023   K 4.4 09/02/2023   CL 101 09/02/2023   CREATININE 0.76 09/02/2023   BUN 17 09/02/2023   CO2 24 09/02/2023   TSH 2.29 03/27/2022   PSA 0.47 03/27/2022   INR 1.1 08/26/2023    HGBA1C 5.9 07/04/2023   MICROALBUR 3.0 (H) 12/27/2022    ECHO TEE Result Date: 09/02/2023    TRANSESOPHOGEAL ECHO REPORT   Patient Name:   Connor Ross [facility] Date of Exam: 09/02/2023 Medical Rec #:  [id number]             Height:       71.0 in Accession #:    [id number]            Weight:       290.1 lb Date of Birth:  1965-11-11             BSA:          2.470 m Patient Age:    71 years  BP:           109/67 mmHg Patient Gender: M                     HR:           89 bpm. Exam Location:  [facility] Procedure: Transesophageal Echo, Color Doppler, Cardiac Doppler and 3D Echo            (Both Spectral and Color Flow Doppler were utilized during            procedure). Indications:     Bacteremia  History:         Patient has prior history of Echocardiogram examinations, most                  recent 06/25/2024. Risk Factors:Sleep Apnea and Hypertension.  Sonographer:     Cristela Blue Sonographer#2:   Mikki Harbor Referring Phys:  Lajuana Carry A KHAN Diagnosing Phys: Windell Norfolk PROCEDURE: After discussion of the risks and benefits of a TEE, an informed consent was obtained from the patient. The transesophogeal probe was passed without difficulty through the esophogus of the patient. Imaged were obtained with the patient in a left lateral decubitus position. Sedation performed by different physician. The patient was monitored while under deep sedation. Image quality was excellent. The patient's vital signs; including heart rate, blood pressure, and oxygen saturation; remained  stable throughout the procedure. The patient developed no complications during the procedure.  IMPRESSIONS  1. Left ventricular ejection fraction, by estimation, is 60 to 65%. The left ventricle has normal function.  2. Right ventricular systolic function is normal. The right ventricular size is normal.  3. No left atrial/left atrial appendage thrombus was detected.  4. Large (2.2 cm X 0.7 cm) mobile echogenic structure noted on  mitral valve consistent with vegetation. Trivial mitral regurgitation.  5. The aortic valve is tricuspid. Aortic valve regurgitation is not visualized.  6. 3D performed of the mitral valve. FINDINGS  Left Ventricle: Left ventricular ejection fraction, by estimation, is 60 to 65%. The left ventricle has normal function. The left ventricular internal cavity size was normal in size. Right Ventricle: The right ventricular size is normal. No increase in right ventricular wall thickness. Right ventricular systolic function is normal. Left Atrium: Left atrial size was normal in size. No left atrial/left atrial appendage thrombus was detected. Right Atrium: Right atrial size was normal in size. Pericardium: There is no evidence of pericardial effusion. Mitral Valve: Large (2.2 cm X 0.7 cm) mobile echogenic structure noted on mitral valve consistent with vegetation. Trivial mitral regurgitation. Tricuspid Valve: The tricuspid valve is normal in structure. Tricuspid valve regurgitation is trivial. Aortic Valve: The aortic valve is tricuspid. Aortic valve regurgitation is not visualized. Pulmonic Valve: The pulmonic valve was normal in structure. Pulmonic valve regurgitation is not visualized. Aorta: The aortic root is normal in size and structure. IAS/Shunts: No atrial level shunt detected by color flow Doppler. Additional Comments: 3D imaging was not performed. Windell Norfolk Electronically signed by Windell Norfolk Signature Date/Time: 09/02/2023/5:33:48 PM    Final     Assessment & Plan:  .There are no diagnoses linked to this encounter.   I spent 34 minutes on the day of this face to face encounter reviewing patient's  most recent visit with cardiology,  nephrology,  and neurology,  prior relevant surgical and non surgical procedures, recent  labs and imaging studies, counseling on weight management,  reviewing the assessment and plan with  patient, and post visit ordering and reviewing of  diagnostics and  therapeutics with patient  .   Follow-up: No follow-ups on file.   Sherlene Shams, MD

## 2023-09-23 NOTE — Telephone Encounter (Signed)
 Copied from CRM [id number]. Topic: Clinical - Medical Advice >> Sep 23, 2023 12:25 PM Sonny Dandy B wrote: Reason for CRM Mallory from [facility] called to speak with Dr. Patrice Paradise. Regarding pt's cardiac surgery. She is requesting a call back regarding pt's Coudamin. Would  like to know if the provider would manage it, follow pt's INR levels. Please call Mallory back at [phone] >> Sep 23, 2023 12:53 PM Drema Balzarine wrote: Maureen Ralphs from [facility] called to follow up on this, request someone to call patient to give him instructions regarding Coumadine dose. Also wanted to make Dr. Darrick Huntsman aware that pateints PT-INR results were faxed over today, they are in the media tab

## 2023-09-23 NOTE — Telephone Encounter (Signed)
 Pt's last INR was drawn on 09/22/2023 the result was 2.1. 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 Pt's current coumadin dose is 5 mg daily.

## 2023-09-24 ENCOUNTER — Ambulatory Visit (INDEPENDENT_AMBULATORY_CARE_PROVIDER_SITE_OTHER): Admitting: Internal Medicine

## 2023-09-24 ENCOUNTER — Encounter: Payer: Self-pay | Admitting: Internal Medicine

## 2023-09-24 VITALS — BP 102/56 | HR 95 | Ht 71.0 in | Wt 280.0 lb

## 2023-09-24 DIAGNOSIS — Z7985 Long-term (current) use of injectable non-insulin antidiabetic drugs: Secondary | ICD-10-CM

## 2023-09-24 DIAGNOSIS — F4322 Adjustment disorder with anxiety: Secondary | ICD-10-CM | POA: Diagnosis not present

## 2023-09-24 DIAGNOSIS — E1142 Type 2 diabetes mellitus with diabetic polyneuropathy: Secondary | ICD-10-CM

## 2023-09-24 DIAGNOSIS — I059 Rheumatic mitral valve disease, unspecified: Secondary | ICD-10-CM | POA: Diagnosis not present

## 2023-09-24 DIAGNOSIS — R252 Cramp and spasm: Secondary | ICD-10-CM | POA: Diagnosis not present

## 2023-09-24 DIAGNOSIS — E871 Hypo-osmolality and hyponatremia: Secondary | ICD-10-CM | POA: Diagnosis not present

## 2023-09-24 DIAGNOSIS — Z5181 Encounter for therapeutic drug level monitoring: Secondary | ICD-10-CM | POA: Diagnosis not present

## 2023-09-24 DIAGNOSIS — Z09 Encounter for follow-up examination after completed treatment for conditions other than malignant neoplasm: Secondary | ICD-10-CM

## 2023-09-24 DIAGNOSIS — I330 Acute and subacute infective endocarditis: Secondary | ICD-10-CM

## 2023-09-24 DIAGNOSIS — Z7901 Long term (current) use of anticoagulants: Secondary | ICD-10-CM | POA: Diagnosis not present

## 2023-09-24 DIAGNOSIS — L03114 Cellulitis of left upper limb: Secondary | ICD-10-CM

## 2023-09-24 DIAGNOSIS — Z952 Presence of prosthetic heart valve: Secondary | ICD-10-CM | POA: Diagnosis not present

## 2023-09-24 LAB — RENAL FUNCTION PANEL
Albumin: 3.9 g/dL (ref 3.5–5.2)
BUN: 24 mg/dL — ABNORMAL HIGH (ref 6–23)
CO2: 27 meq/L (ref 19–32)
Calcium: 9.3 mg/dL (ref 8.4–10.5)
Chloride: 93 meq/L — ABNORMAL LOW (ref 96–112)
Creatinine, Ser: 0.92 mg/dL (ref 0.40–1.50)
GFR: 91.02 mL/min (ref >60.00)
Glucose, Bld: 198 mg/dL — ABNORMAL HIGH (ref 70–99)
Phosphorus: 4.8 mg/dL — ABNORMAL HIGH (ref 2.3–4.6)
Potassium: 4.2 meq/L (ref 3.5–5.1)
Sodium: 132 meq/L — ABNORMAL LOW (ref 135–145)

## 2023-09-24 LAB — PROTIME-INR
INR: 2.6 ratio — ABNORMAL HIGH (ref 0.8–1.0)
Prothrombin Time: 26.5 s — ABNORMAL HIGH (ref 9.6–13.1)

## 2023-09-24 LAB — POCT GLYCOSYLATED HEMOGLOBIN (HGB A1C): Hemoglobin A1C: 7.9 % — AB (ref 4.0–5.6)

## 2023-09-24 LAB — MAGNESIUM: Magnesium: 1.7 mg/dL (ref 1.5–2.5)

## 2023-09-24 MED ORDER — METOPROLOL TARTRATE 25 MG PO TABS: 12.5000 mg | ORAL_TABLET | Freq: Two times a day (BID) | ORAL | Status: DC

## 2023-09-24 MED ORDER — METHOCARBAMOL 750 MG PO TABS: 750.0000 mg | ORAL_TABLET | Freq: Three times a day (TID) | ORAL | 0 refills | Status: AC | PRN

## 2023-09-24 NOTE — Assessment & Plan Note (Signed)
 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

## 2023-09-24 NOTE — Assessment & Plan Note (Signed)
Patient is stable post discharge and has no new issues or questions about his discharge plans 

## 2023-09-24 NOTE — Assessment & Plan Note (Signed)
 loss of control note due to holidays and current infection. Continue Tresiba 70 units q 12 hrs,  novolog with each meal .  Currently using 40 25- 40 .  advised to increase evening dose by 5 units/  Lab Results  Component Value Date   HGBA1C 7.9 (A) 09/24/2023

## 2023-09-24 NOTE — Assessment & Plan Note (Signed)
 Likely due to overdiuresis . He is having recurrent cramps .  Will reduce torsemide to 40 mg daily and resume jardiance

## 2023-09-24 NOTE — Patient Instructions (Addendum)
 Continue using mustard for cramps but also may continue methocarbamol and try using heating pad   Elevate feet or they WILL swell   We will adjust your coumadin level based on today's INR  We are In the process of getting you MDINR for home testing   Increase the evening novolog to 45 units and continue 40 at breakfast and 25 at lunch   Continue tresiba 70 units twice daily

## 2023-09-24 NOTE — Assessment & Plan Note (Signed)
 Jardiance was suspended at DC from [facility] and torsemide was given

## 2023-09-24 NOTE — Assessment & Plan Note (Signed)
 He declines pharmacotherapy

## 2023-09-24 NOTE — Assessment & Plan Note (Signed)
 INR is at goal on 5 mg daily .    Lab Results  Component Value Date   INR 2.6 (H) 09/24/2023   INR 1.1 08/26/2023   INR 1.0 06/24/2022

## 2023-09-24 NOTE — Assessment & Plan Note (Signed)
 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  S/p mitral valve replacement on Feb 25  and IV ceftriaxone until March 25

## 2023-09-25 DIAGNOSIS — I4891 Unspecified atrial fibrillation: Secondary | ICD-10-CM | POA: Diagnosis not present

## 2023-09-25 DIAGNOSIS — B954 Other streptococcus as the cause of diseases classified elsewhere: Secondary | ICD-10-CM | POA: Diagnosis not present

## 2023-09-25 DIAGNOSIS — E1159 Type 2 diabetes mellitus with other circulatory complications: Secondary | ICD-10-CM | POA: Diagnosis not present

## 2023-09-25 DIAGNOSIS — I76 Septic arterial embolism: Secondary | ICD-10-CM | POA: Diagnosis not present

## 2023-09-25 DIAGNOSIS — R7881 Bacteremia: Secondary | ICD-10-CM | POA: Diagnosis not present

## 2023-09-25 DIAGNOSIS — Z48812 Encounter for surgical aftercare following surgery on the circulatory system: Secondary | ICD-10-CM | POA: Diagnosis not present

## 2023-09-25 DIAGNOSIS — Z452 Encounter for adjustment and management of vascular access device: Secondary | ICD-10-CM | POA: Diagnosis not present

## 2023-09-25 DIAGNOSIS — I059 Rheumatic mitral valve disease, unspecified: Secondary | ICD-10-CM | POA: Diagnosis not present

## 2023-09-25 DIAGNOSIS — I152 Hypertension secondary to endocrine disorders: Secondary | ICD-10-CM | POA: Diagnosis not present

## 2023-09-26 DIAGNOSIS — I152 Hypertension secondary to endocrine disorders: Secondary | ICD-10-CM | POA: Diagnosis not present

## 2023-09-26 DIAGNOSIS — E1159 Type 2 diabetes mellitus with other circulatory complications: Secondary | ICD-10-CM | POA: Diagnosis not present

## 2023-09-26 DIAGNOSIS — B954 Other streptococcus as the cause of diseases classified elsewhere: Secondary | ICD-10-CM | POA: Diagnosis not present

## 2023-09-26 DIAGNOSIS — I76 Septic arterial embolism: Secondary | ICD-10-CM | POA: Diagnosis not present

## 2023-09-26 DIAGNOSIS — I4891 Unspecified atrial fibrillation: Secondary | ICD-10-CM | POA: Diagnosis not present

## 2023-09-26 DIAGNOSIS — Z48812 Encounter for surgical aftercare following surgery on the circulatory system: Secondary | ICD-10-CM | POA: Diagnosis not present

## 2023-09-26 DIAGNOSIS — Z452 Encounter for adjustment and management of vascular access device: Secondary | ICD-10-CM | POA: Diagnosis not present

## 2023-09-26 DIAGNOSIS — I059 Rheumatic mitral valve disease, unspecified: Secondary | ICD-10-CM | POA: Diagnosis not present

## 2023-09-26 DIAGNOSIS — R7881 Bacteremia: Secondary | ICD-10-CM | POA: Diagnosis not present

## 2023-09-29 ENCOUNTER — Other Ambulatory Visit: Payer: Medicare HMO

## 2023-09-29 DIAGNOSIS — Z7901 Long term (current) use of anticoagulants: Secondary | ICD-10-CM | POA: Diagnosis not present

## 2023-09-29 DIAGNOSIS — I39 Endocarditis and heart valve disorders in diseases classified elsewhere: Secondary | ICD-10-CM | POA: Diagnosis not present

## 2023-09-29 DIAGNOSIS — Z452 Encounter for adjustment and management of vascular access device: Secondary | ICD-10-CM | POA: Diagnosis not present

## 2023-09-29 DIAGNOSIS — E1159 Type 2 diabetes mellitus with other circulatory complications: Secondary | ICD-10-CM | POA: Diagnosis not present

## 2023-09-29 DIAGNOSIS — Z952 Presence of prosthetic heart valve: Secondary | ICD-10-CM | POA: Diagnosis not present

## 2023-09-29 DIAGNOSIS — I76 Septic arterial embolism: Secondary | ICD-10-CM | POA: Diagnosis not present

## 2023-09-29 DIAGNOSIS — I152 Hypertension secondary to endocrine disorders: Secondary | ICD-10-CM | POA: Diagnosis not present

## 2023-09-29 DIAGNOSIS — I330 Acute and subacute infective endocarditis: Secondary | ICD-10-CM | POA: Diagnosis not present

## 2023-09-29 DIAGNOSIS — R7881 Bacteremia: Secondary | ICD-10-CM | POA: Diagnosis not present

## 2023-09-29 DIAGNOSIS — I059 Rheumatic mitral valve disease, unspecified: Secondary | ICD-10-CM | POA: Diagnosis not present

## 2023-09-29 DIAGNOSIS — I4891 Unspecified atrial fibrillation: Secondary | ICD-10-CM | POA: Diagnosis not present

## 2023-09-29 DIAGNOSIS — B954 Other streptococcus as the cause of diseases classified elsewhere: Secondary | ICD-10-CM | POA: Diagnosis not present

## 2023-09-29 DIAGNOSIS — Z48812 Encounter for surgical aftercare following surgery on the circulatory system: Secondary | ICD-10-CM | POA: Diagnosis not present

## 2023-09-29 LAB — LAB REPORT - SCANNED: EGFR: 87

## 2023-10-01 DIAGNOSIS — R9431 Abnormal electrocardiogram [ECG] [EKG]: Secondary | ICD-10-CM | POA: Diagnosis not present

## 2023-10-01 DIAGNOSIS — R600 Localized edema: Secondary | ICD-10-CM | POA: Diagnosis not present

## 2023-10-01 DIAGNOSIS — Z952 Presence of prosthetic heart valve: Secondary | ICD-10-CM | POA: Diagnosis not present

## 2023-10-01 DIAGNOSIS — I059 Rheumatic mitral valve disease, unspecified: Secondary | ICD-10-CM | POA: Diagnosis not present

## 2023-10-01 DIAGNOSIS — J90 Pleural effusion, not elsewhere classified: Secondary | ICD-10-CM | POA: Diagnosis not present

## 2023-10-01 DIAGNOSIS — Z48812 Encounter for surgical aftercare following surgery on the circulatory system: Secondary | ICD-10-CM | POA: Diagnosis not present

## 2023-10-01 DIAGNOSIS — Z9889 Other specified postprocedural states: Secondary | ICD-10-CM | POA: Diagnosis not present

## 2023-10-01 DIAGNOSIS — M549 Dorsalgia, unspecified: Secondary | ICD-10-CM | POA: Diagnosis not present

## 2023-10-01 DIAGNOSIS — R918 Other nonspecific abnormal finding of lung field: Secondary | ICD-10-CM | POA: Diagnosis not present

## 2023-10-02 ENCOUNTER — Encounter: Payer: Self-pay | Admitting: Internal Medicine

## 2023-10-02 DIAGNOSIS — I059 Rheumatic mitral valve disease, unspecified: Secondary | ICD-10-CM | POA: Diagnosis not present

## 2023-10-02 DIAGNOSIS — Z452 Encounter for adjustment and management of vascular access device: Secondary | ICD-10-CM | POA: Diagnosis not present

## 2023-10-02 DIAGNOSIS — E1159 Type 2 diabetes mellitus with other circulatory complications: Secondary | ICD-10-CM | POA: Diagnosis not present

## 2023-10-02 DIAGNOSIS — I4891 Unspecified atrial fibrillation: Secondary | ICD-10-CM | POA: Diagnosis not present

## 2023-10-02 DIAGNOSIS — B954 Other streptococcus as the cause of diseases classified elsewhere: Secondary | ICD-10-CM | POA: Diagnosis not present

## 2023-10-02 DIAGNOSIS — R7881 Bacteremia: Secondary | ICD-10-CM | POA: Diagnosis not present

## 2023-10-02 DIAGNOSIS — I76 Septic arterial embolism: Secondary | ICD-10-CM | POA: Diagnosis not present

## 2023-10-02 DIAGNOSIS — I152 Hypertension secondary to endocrine disorders: Secondary | ICD-10-CM | POA: Diagnosis not present

## 2023-10-02 DIAGNOSIS — Z48812 Encounter for surgical aftercare following surgery on the circulatory system: Secondary | ICD-10-CM | POA: Diagnosis not present

## 2023-10-03 ENCOUNTER — Telehealth (INDEPENDENT_AMBULATORY_CARE_PROVIDER_SITE_OTHER): Payer: Medicare HMO | Admitting: Internal Medicine

## 2023-10-03 ENCOUNTER — Encounter: Payer: Self-pay | Admitting: Internal Medicine

## 2023-10-03 VITALS — BP 103/60 | HR 88 | Ht 71.0 in | Wt 282.0 lb

## 2023-10-03 DIAGNOSIS — Z7985 Long-term (current) use of injectable non-insulin antidiabetic drugs: Secondary | ICD-10-CM

## 2023-10-03 DIAGNOSIS — I152 Hypertension secondary to endocrine disorders: Secondary | ICD-10-CM

## 2023-10-03 DIAGNOSIS — E1159 Type 2 diabetes mellitus with other circulatory complications: Secondary | ICD-10-CM | POA: Diagnosis not present

## 2023-10-03 DIAGNOSIS — E1142 Type 2 diabetes mellitus with diabetic polyneuropathy: Secondary | ICD-10-CM | POA: Diagnosis not present

## 2023-10-03 NOTE — Assessment & Plan Note (Addendum)
 Losartan and Jardiance topped during hospitalization due to hypotension .  Corrected UACR is 136.  Will resume when able to add  but BP has been too soft.  Home BS have been ranging from 154 , 158, 170 fasting t  afternoons 253

## 2023-10-03 NOTE — Progress Notes (Signed)
 Virtual Visit via Caregility   Note   This format is felt to be most appropriate for this patient at this time.  All issues noted in this document were discussed and addressed.  No physical exam was performed (except for noted visual exam findings with Video Visits).   I connected with Connor Ross  on 10/03/23 at  2:00 PM EDT by a video enabled telemedicine application  and verified that I am speaking with the correct person using two identifiers. Location patient: home Location provider: work or home office Persons participating in the virtual visit: patient, provider and wife Connor Ross   I discussed the limitations, risks, security and privacy concerns of performing an evaluation and management service by telephone and the availability of in person appointments. I also discussed with the patient that there may be a patient responsible charge related to this service. The patient expressed understanding and agreed to proceed.  Reason for visit: follow up on multiple issues   HPI:  Follow up on multiple issues   1) heart failure:  reviewed recent CVTS follow up.  Torsemide  dose was increased tpo 40 mg bid,  amiodarone is being discontinued after current 30 day refill.    2( Diabetes with microalbuminuria :  blood sugars h ave been around 150 fasting.  recent UACR reviewed 136.  BP has been to soft to resume losartan .  Jardiance was suspended during hosptilaization   3)  Cognitive changes ; he has been more forgetful since his heart surgery . He has been referred to neurology   ROS: See pertinent positives and negatives per HPI.  Past Medical History:  Diagnosis Date   Diabetes mellitus    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 type 2 diabetes mellitus, limited to breakdown of skin (HCC) 04/23/2019   Fracture of one rib, left side, subsequent encounter for fracture with routine healing 08/27/2020   Hyperlipidemia    Hypertension    Influenza A 08/26/2023   Normal cardiac stress  test 2012   Fath   NSTEMI (non-ST elevated myocardial infarction) (HCC) 08/26/2023   Sepsis due to pneumonia (HCC) 08/26/2023   Septicemia due to group D Streptococcus (HCC) 08/26/2023    Past Surgical History:  Procedure Laterality Date   CORONARY STENT INTERVENTION N/A 06/25/2022   Procedure: CORONARY STENT INTERVENTION;  Surgeon: Alwyn Pea, MD;  Location: [facility] INVASIVE CV LAB;  Service: Cardiovascular;  Laterality: N/A;   CORONARY STENT INTERVENTION N/A 06/26/2022   Procedure: CORONARY STENT INTERVENTION;  Surgeon: Alwyn Pea, MD;  Location: [facility] INVASIVE CV LAB;  Service: Cardiovascular;  Laterality: N/A;   EXOSTECTOMY Right 06/2019   5th MT head  ([facility])   LEFT HEART CATH AND CORONARY ANGIOGRAPHY N/A 06/25/2022   Procedure: LEFT HEART CATH AND CORONARY ANGIOGRAPHY WITH POSSIBLE STENT;  Surgeon: Alwyn Pea, MD;  Location: [facility] INVASIVE CV LAB;  Service: Cardiovascular;  Laterality: N/A;  12:30 following DC first case   LUMBAR DISC SURGERY     TEE WITHOUT CARDIOVERSION Right 09/02/2023   Procedure: TRANSESOPHAGEAL ECHOCARDIOGRAM (TEE);  Surgeon: Alluri, Meryl Dare, MD;  Location: [facility] ORS;  Service: Cardiovascular;  Laterality: Right;    Family History  Problem Relation Age of Onset   Coronary artery disease Mother 53   COPD Mother    Heart disease Mother    Hyperlipidemia Mother    Hypertension Mother    Cancer Father    Hyperlipidemia Father    Hypertension Brother  Cancer Brother        lymphoma   Diabetes Maternal Aunt    Diabetes Maternal Uncle    Diabetes Maternal Grandmother    Heart disease Maternal Grandmother     SOCIAL HX:  reports that he has never smoked. He has never used smokeless tobacco. He reports that he does not drink alcohol and does not use drugs.    Current Outpatient Medications:    amiodarone (PACERONE) 200 MG tablet, Take 200 mg by mouth daily., Disp: , Rfl:    ASPIRIN LOW DOSE 81 MG EC tablet, TAKE 1 TABLET BY  MOUTH  DAILY (SWALLOW WHOLE), Disp: 90 tablet, Rfl: 1   blood glucose meter kit and supplies, Dispense based on patient and insurance preference. Use up to four times daily as directed. (FOR ICD-10 E10.9, E11.9). Use to check blood sugar three times daily, Disp: 1 each, Rfl: 0   cefTRIAXone 2 g in sodium chloride 0.9 % 100 mL, Inject 2 g into the vein every 12 (twelve) hours., Disp: , Rfl:    clobetasol cream (TEMOVATE) 0.05 %, Apply 1 Application topically 2 (two) times daily., Disp: , Rfl:    gabapentin (NEURONTIN) 100 MG capsule, TAKE 2 CAPSULES THREE TIMES DAILY, Disp: 540 capsule, Rfl: 3   insulin aspart (NOVOLOG FLEXPEN) 100 UNIT/ML FlexPen, Inject up to 25 units with breakfast, 25 units with lunch, and 30 units with supper, Disp: 72 mL, Rfl: 2   insulin degludec (TRESIBA FLEXTOUCH) 200 UNIT/ML FlexTouch Pen, Inject 88 Units into the skin daily. (Patient taking differently: Inject 70 Units into the skin in the morning and at bedtime.), Disp: 48 mL, Rfl: 2   Insulin Pen Needle 32G X 6 MM MISC, Use daily with insulin pen, Disp: 50 each, Rfl: 0   ketoconazole (NIZORAL) 2 % cream, SMARTSIG:1 Application Topical 1 to 2 Times Daily, Disp: , Rfl:    Lancets (FREESTYLE) lancets, 1 each by Other route 2 (two) times daily. DX 250.02, Disp: 100 each, Rfl: 12   metoprolol tartrate (LOPRESSOR) 25 MG tablet, Take 12.5 mg by mouth 2 (two) times daily., Disp: , Rfl:    ONETOUCH VERIO test strip, USE TO CHECK BLOOD SUGAR 3  TIMES DAILY, Disp: 300 strip, Rfl: 3   potassium chloride SA (KLOR-CON M) 20 MEQ tablet, Take 40 mEq by mouth daily., Disp: , Rfl:    rosuvastatin (CRESTOR) 40 MG tablet, Take 1 tablet (40 mg total) by mouth every evening for 360 doses., Disp: 90 tablet, Rfl: 3   Semaglutide, 2 MG/DOSE, (OZEMPIC, 2 MG/DOSE,) 8 MG/3ML SOPN, Inject 2 mg into the skin once a week., Disp: 8 mL, Rfl: 2   spironolactone (ALDACTONE) 25 MG tablet, Take 1 tablet by mouth daily., Disp: , Rfl:    tamsulosin (FLOMAX)  0.4 MG CAPS capsule, TAKE 1 CAPSULE EVERY DAY, Disp: 90 capsule, Rfl: 3   torsemide (DEMADEX) 20 MG tablet, Take 40 mg by mouth 2 (two) times daily., Disp: , Rfl:    traMADol (ULTRAM) 50 MG tablet, Take 1 tablet (50 mg total) by mouth every 6 (six) hours as needed., Disp: 360 tablet, Rfl: 1   triamcinolone (KENALOG) 0.025 % cream, Apply topically., Disp: , Rfl:    Vitamin D, Ergocalciferol, (DRISDOL) 1.25 MG (50000 UNIT) CAPS capsule, Take 50,000 Units by mouth once a week., Disp: , Rfl:    warfarin (COUMADIN) 2 MG tablet, Take 5 mg by mouth daily at 12 noon., Disp: , Rfl:    Continuous Blood Gluc Sensor (  FREESTYLE LIBRE 2 SENSOR) MISC, Use to check sugar at least 4 times daily (Patient not taking: Reported on 10/03/2023), Disp: 1 each, Rfl: 2   cyanocobalamin 1000 MCG tablet, Take 1 tablet (1,000 mcg total) by mouth daily. (Patient not taking: Reported on 10/03/2023), Disp: , Rfl:    empagliflozin (JARDIANCE) 25 MG TABS tablet, Take 1 tablet (25 mg total) by mouth daily. (Patient not taking: Reported on 09/24/2023), Disp: 90 tablet, Rfl: 3   fluticasone (FLONASE) 50 MCG/ACT nasal spray, PLACE 2 SPRAYS INTO BOTH NOSTRILS DAILY. AT NIGHT AFTER NASAL SALINE (Patient not taking: Reported on 09/24/2023), Disp: 16 g, Rfl: 2   methocarbamol (ROBAXIN) 750 MG tablet, Take 1 tablet (750 mg total) by mouth every 8 (eight) hours as needed for muscle spasms. (Patient not taking: Reported on 10/03/2023), Disp: 30 tablet, Rfl: 0   polyethylene glycol (MIRALAX / GLYCOLAX) 17 g packet, Take 17 g by mouth daily as needed. (Patient not taking: Reported on 10/03/2023), Disp: , Rfl:    senna-docusate (SENOKOT-S) 8.6-50 MG tablet, Take 2 tablets by mouth as needed. (Patient not taking: Reported on 10/03/2023), Disp: , Rfl:   EXAM:  VITALS per patient if applicable:  GENERAL: alert, oriented, appears well and in no acute distress  HEENT: atraumatic, conjunttiva clear, no obvious abnormalities on inspection of external nose  and ears  NECK: normal movements of the head and neck  LUNGS: on inspection no signs of respiratory distress, breathing rate appears normal, no obvious gross SOB, gasping or wheezing  CV: no obvious cyanosis  MS: moves all visible extremities without noticeable abnormality  PSYCH/NEURO: pleasant and cooperative, no obvious depression or anxiety, speech and thought processing grossly intact  ASSESSMENT AND PLAN: Hypertension associated with diabetes (HCC) Assessment & Plan: Losartan and Jardiance topped during hospitalization due to hypotension .  Corrected UACR is 136.  Will resume when able to add  but BP has been too soft.  Home BS have been ranging from 154 , 158, 170 fasting t  afternoons 253    Type 2 diabetes mellitus with peripheral neuropathy (HCC) Assessment & Plan: loss of control note  last month.   Continue Tresiba 70 units q 12 hrs,  novolog with each meal .  Currently using 40 25- 45 .  advised to increase lunchtime dose to 30 units   Lab Results  Component Value Date   HGBA1C 7.9 (A) 09/24/2023      Long-term current use of injectable noninsulin antidiabetic medication Assessment & Plan: [place] Pepper was suspended at DC from [facility] and torsemide was given        I discussed the assessment and treatment plan with the patient. The patient was provided an opportunity to ask questions and all were answered. The patient agreed with the plan and demonstrated an understanding of the instructions.   The patient was advised to call back or seek an in-person evaluation if the symptoms worsen or if the condition fails to improve as anticipated.   I spent 30 minutes dedicated to the care of this patient on the date of this encounter to include pre-visit review of his medical history,  Face-to-face time with the patient , and post visit ordering of testing and therapeutics.    Sherlene Shams, MD

## 2023-10-03 NOTE — Patient Instructions (Signed)
 Increase your lunchtime dose of insulin from 25 units to 30 units.  Continue 40 units in the am and 34 in the pm (pre meal)   Home Health RN to draw PT/INR and BMET on Monday   Resume use of CBG monitor so we can get more diabetes readings.  [name] please bring the "reader"  (not the sensor) by the office after 2 weeks so we can download the information  and then we will have a virtual meeting  the following week to review "

## 2023-10-05 ENCOUNTER — Encounter: Payer: Self-pay | Admitting: Internal Medicine

## 2023-10-05 NOTE — Assessment & Plan Note (Signed)
 Jardiance was suspended at DC from [facility] and torsemide was given

## 2023-10-05 NOTE — Assessment & Plan Note (Signed)
 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  S/p mitral valve replacement on Feb 25  and IV ceftriaxone until March 25

## 2023-10-05 NOTE — Assessment & Plan Note (Signed)
 loss of control note  last month.   Continue Tresiba 70 units q 12 hrs,  novolog with each meal .  Currently using 40 25- 45 .  advised to increase lunchtime dose to 30 units   Lab Results  Component Value Date   HGBA1C 7.9 (A) 09/24/2023

## 2023-10-06 ENCOUNTER — Telehealth: Payer: Self-pay

## 2023-10-06 DIAGNOSIS — I4891 Unspecified atrial fibrillation: Secondary | ICD-10-CM | POA: Diagnosis not present

## 2023-10-06 DIAGNOSIS — R7881 Bacteremia: Secondary | ICD-10-CM | POA: Diagnosis not present

## 2023-10-06 DIAGNOSIS — I059 Rheumatic mitral valve disease, unspecified: Secondary | ICD-10-CM | POA: Diagnosis not present

## 2023-10-06 DIAGNOSIS — E1159 Type 2 diabetes mellitus with other circulatory complications: Secondary | ICD-10-CM | POA: Diagnosis not present

## 2023-10-06 DIAGNOSIS — I152 Hypertension secondary to endocrine disorders: Secondary | ICD-10-CM | POA: Diagnosis not present

## 2023-10-06 DIAGNOSIS — I39 Endocarditis and heart valve disorders in diseases classified elsewhere: Secondary | ICD-10-CM | POA: Diagnosis not present

## 2023-10-06 DIAGNOSIS — I76 Septic arterial embolism: Secondary | ICD-10-CM | POA: Diagnosis not present

## 2023-10-06 DIAGNOSIS — I330 Acute and subacute infective endocarditis: Secondary | ICD-10-CM | POA: Diagnosis not present

## 2023-10-06 DIAGNOSIS — Z48812 Encounter for surgical aftercare following surgery on the circulatory system: Secondary | ICD-10-CM | POA: Diagnosis not present

## 2023-10-06 DIAGNOSIS — B954 Other streptococcus as the cause of diseases classified elsewhere: Secondary | ICD-10-CM | POA: Diagnosis not present

## 2023-10-06 DIAGNOSIS — Z452 Encounter for adjustment and management of vascular access device: Secondary | ICD-10-CM | POA: Diagnosis not present

## 2023-10-06 NOTE — Telephone Encounter (Signed)
 Patient assistance medications: 4 boxes of Ozempic 8 boxes of Novolog  Received 2 boxes of pen needles out of 4 that was expected Left message to call back to let him know about his medications. Please send call to [facility].

## 2023-10-06 NOTE — Telephone Encounter (Signed)
 Printed out letter for [employer] letting them know that we received 2 of the 4 boxes of pen needles and to please send the other 2 boxes and faxed to [phone] and received and okay confirmation. [employer] stated it will take 48-72 hours to approve this request then it will have to ship.

## 2023-10-07 DIAGNOSIS — I76 Septic arterial embolism: Secondary | ICD-10-CM | POA: Diagnosis not present

## 2023-10-07 DIAGNOSIS — Z952 Presence of prosthetic heart valve: Secondary | ICD-10-CM | POA: Diagnosis not present

## 2023-10-07 DIAGNOSIS — I059 Rheumatic mitral valve disease, unspecified: Secondary | ICD-10-CM | POA: Diagnosis not present

## 2023-10-07 DIAGNOSIS — Z792 Long term (current) use of antibiotics: Secondary | ICD-10-CM | POA: Diagnosis not present

## 2023-10-07 DIAGNOSIS — A491 Streptococcal infection, unspecified site: Secondary | ICD-10-CM | POA: Diagnosis not present

## 2023-10-07 NOTE — Telephone Encounter (Signed)
 Copied from CRM [id number]. Topic: General - Other >> Oct 07, 2023  3:47 PM Eunice Blase wrote: Reason for CRM: Received call from pt's wife and pt returning call from [place]. Please call wife's [name] phone [phone].

## 2023-10-08 NOTE — Telephone Encounter (Signed)
 Copied from CRM [id number]. Topic: Clinical - Medication Question >> Oct 08, 2023  8:47 AM Lennart Pall wrote: Reason for CRM: Patients wife calling back for [place].. please call [phone], returning call about medications  Please call patient in absence of Lanora Manis this morning. Information is below.

## 2023-10-09 ENCOUNTER — Telehealth: Payer: Self-pay

## 2023-10-09 DIAGNOSIS — Z5181 Encounter for therapeutic drug level monitoring: Secondary | ICD-10-CM

## 2023-10-09 DIAGNOSIS — Z452 Encounter for adjustment and management of vascular access device: Secondary | ICD-10-CM | POA: Diagnosis not present

## 2023-10-09 DIAGNOSIS — E1159 Type 2 diabetes mellitus with other circulatory complications: Secondary | ICD-10-CM | POA: Diagnosis not present

## 2023-10-09 DIAGNOSIS — I76 Septic arterial embolism: Secondary | ICD-10-CM | POA: Diagnosis not present

## 2023-10-09 DIAGNOSIS — Z48812 Encounter for surgical aftercare following surgery on the circulatory system: Secondary | ICD-10-CM | POA: Diagnosis not present

## 2023-10-09 DIAGNOSIS — I059 Rheumatic mitral valve disease, unspecified: Secondary | ICD-10-CM | POA: Diagnosis not present

## 2023-10-09 DIAGNOSIS — R7881 Bacteremia: Secondary | ICD-10-CM | POA: Diagnosis not present

## 2023-10-09 DIAGNOSIS — B954 Other streptococcus as the cause of diseases classified elsewhere: Secondary | ICD-10-CM | POA: Diagnosis not present

## 2023-10-09 DIAGNOSIS — I4891 Unspecified atrial fibrillation: Secondary | ICD-10-CM | POA: Diagnosis not present

## 2023-10-09 DIAGNOSIS — I152 Hypertension secondary to endocrine disorders: Secondary | ICD-10-CM | POA: Diagnosis not present

## 2023-10-09 NOTE — Telephone Encounter (Signed)
 Called the Patient's wife [name] and let her know the Patient assistance medications are ready for pick up. [name] states they will pick those up on 10/10/23.  [name] states they have not heard anything regarding the INR and would like to know if he needs to come in to our lab and get that drawn?

## 2023-10-09 NOTE — Telephone Encounter (Signed)
 Spoke with pt's wife to let her know that we have received pt's patient assistance medication in the office and it is ready for pick up.   Evaristo Bury: 6 boxes

## 2023-10-09 NOTE — Telephone Encounter (Signed)
 Lab orders have been placed

## 2023-10-09 NOTE — Telephone Encounter (Signed)
 Copied from CRM [id number]. Topic: General - Other >> Oct 09, 2023  4:00 PM Jon Gills C wrote: Reason for CRM: Maureen Ralphs from [facility] called in regarding a miss call from [place], let me know that she may be with patients so she  Can leave a message   [phone]

## 2023-10-09 NOTE — Telephone Encounter (Signed)
 The nurse evidently did not go out and draw the blood we requested on Friday. Should we just have pt come in and have it done here at the office.

## 2023-10-09 NOTE — Telephone Encounter (Signed)
 Spoke with Maureen Ralphs and she stated that she spoke with the lab and they stated that the [facility] lab does not have the blood or the requisitions for the blood that she drew on Monday. I have called and and scheduled him a lab appt for tomorrow.

## 2023-10-09 NOTE — Addendum Note (Signed)
 Addended by: Sandy Salaam on: 10/09/2023 05:15 PM   Modules accepted: Orders

## 2023-10-09 NOTE — Telephone Encounter (Signed)
 Spoke with pt's wife to get them scheduled for a lab appt tomorrow and she stated that the nurse did draw the blood on Monday. She gave me the phone number of the nurse that came out and drew the blood. I tried reaching out to her but I had to leave her a message to call us back asap. Do you want me to wait until I get in touch with her or do you want the pt to com eon in for the blood work tomorrow. I can see that she drew a CMP and CBC but I don't see the INR or the Fructosamine.

## 2023-10-10 ENCOUNTER — Telehealth: Payer: Self-pay

## 2023-10-10 ENCOUNTER — Other Ambulatory Visit (INDEPENDENT_AMBULATORY_CARE_PROVIDER_SITE_OTHER)

## 2023-10-10 DIAGNOSIS — Z125 Encounter for screening for malignant neoplasm of prostate: Secondary | ICD-10-CM

## 2023-10-10 DIAGNOSIS — Z952 Presence of prosthetic heart valve: Secondary | ICD-10-CM | POA: Diagnosis not present

## 2023-10-10 DIAGNOSIS — Z7901 Long term (current) use of anticoagulants: Secondary | ICD-10-CM | POA: Diagnosis not present

## 2023-10-10 DIAGNOSIS — I2510 Atherosclerotic heart disease of native coronary artery without angina pectoris: Secondary | ICD-10-CM | POA: Diagnosis not present

## 2023-10-10 DIAGNOSIS — I152 Hypertension secondary to endocrine disorders: Secondary | ICD-10-CM | POA: Diagnosis not present

## 2023-10-10 DIAGNOSIS — Z5181 Encounter for therapeutic drug level monitoring: Secondary | ICD-10-CM | POA: Diagnosis not present

## 2023-10-10 DIAGNOSIS — I10 Essential (primary) hypertension: Secondary | ICD-10-CM | POA: Diagnosis not present

## 2023-10-10 DIAGNOSIS — R600 Localized edema: Secondary | ICD-10-CM | POA: Diagnosis not present

## 2023-10-10 DIAGNOSIS — E1159 Type 2 diabetes mellitus with other circulatory complications: Secondary | ICD-10-CM

## 2023-10-10 DIAGNOSIS — E782 Mixed hyperlipidemia: Secondary | ICD-10-CM | POA: Diagnosis not present

## 2023-10-10 DIAGNOSIS — E669 Obesity, unspecified: Secondary | ICD-10-CM | POA: Diagnosis not present

## 2023-10-10 DIAGNOSIS — I252 Old myocardial infarction: Secondary | ICD-10-CM | POA: Diagnosis not present

## 2023-10-10 DIAGNOSIS — E1169 Type 2 diabetes mellitus with other specified complication: Secondary | ICD-10-CM | POA: Diagnosis not present

## 2023-10-10 NOTE — Addendum Note (Signed)
 Addended by: Jarvis Morgan D on: 10/10/2023 01:48 PM   Modules accepted: Orders

## 2023-10-10 NOTE — Addendum Note (Signed)
 Addended by: Jarvis Morgan D on: 10/10/2023 01:47 PM   Modules accepted: Orders

## 2023-10-10 NOTE — Telephone Encounter (Signed)
 Spoke with [facility] today to find out if the patient had been approved for the home machine because they have not heard anything. The rep at [facility] stated that pt's insurance will not approve the home machine until he has been on the medication for 90 days. Called pt to let him know.

## 2023-10-10 NOTE — Telephone Encounter (Signed)
 Pt had labs drawn today at 2pm.

## 2023-10-10 NOTE — Addendum Note (Signed)
 Addended by: Jarvis Morgan D on: 10/10/2023 01:50 PM   Modules accepted: Orders

## 2023-10-10 NOTE — Telephone Encounter (Signed)
 Pt came in to pick up his pt assistance medication on 10/10/23 @ 2pm

## 2023-10-11 LAB — BASIC METABOLIC PANEL WITH GFR
BUN/Creatinine Ratio: 22 — ABNORMAL HIGH (ref 9–20)
BUN: 22 mg/dL (ref 6–24)
CO2: 22 mmol/L (ref 20–29)
Calcium: 8.9 mg/dL (ref 8.7–10.2)
Chloride: 94 mmol/L — ABNORMAL LOW (ref 96–106)
Creatinine, Ser: 1.02 mg/dL (ref 0.76–1.27)
Glucose: 222 mg/dL — ABNORMAL HIGH (ref 70–99)
Potassium: 4.1 mmol/L (ref 3.5–5.2)
Sodium: 136 mmol/L (ref 134–144)
eGFR: 85 mL/min/{1.73_m2} (ref >59)

## 2023-10-11 LAB — PROTIME-INR
INR: 3.7 — ABNORMAL HIGH
Prothrombin Time: 35.6 s — ABNORMAL HIGH (ref 9.0–11.5)

## 2023-10-11 LAB — FRUCTOSAMINE: Fructosamine: 242 umol/L (ref 0–285)

## 2023-10-13 ENCOUNTER — Encounter: Payer: Self-pay | Admitting: Internal Medicine

## 2023-10-13 DIAGNOSIS — I76 Septic arterial embolism: Secondary | ICD-10-CM | POA: Diagnosis not present

## 2023-10-13 DIAGNOSIS — B954 Other streptococcus as the cause of diseases classified elsewhere: Secondary | ICD-10-CM | POA: Diagnosis not present

## 2023-10-13 DIAGNOSIS — Z48812 Encounter for surgical aftercare following surgery on the circulatory system: Secondary | ICD-10-CM | POA: Diagnosis not present

## 2023-10-13 DIAGNOSIS — I4891 Unspecified atrial fibrillation: Secondary | ICD-10-CM | POA: Diagnosis not present

## 2023-10-13 DIAGNOSIS — I059 Rheumatic mitral valve disease, unspecified: Secondary | ICD-10-CM | POA: Diagnosis not present

## 2023-10-13 DIAGNOSIS — I152 Hypertension secondary to endocrine disorders: Secondary | ICD-10-CM | POA: Diagnosis not present

## 2023-10-13 DIAGNOSIS — R7881 Bacteremia: Secondary | ICD-10-CM | POA: Diagnosis not present

## 2023-10-13 DIAGNOSIS — E1159 Type 2 diabetes mellitus with other circulatory complications: Secondary | ICD-10-CM | POA: Diagnosis not present

## 2023-10-13 DIAGNOSIS — Z452 Encounter for adjustment and management of vascular access device: Secondary | ICD-10-CM | POA: Diagnosis not present

## 2023-10-13 MED ORDER — WARFARIN SODIUM 2 MG PO TABS: 4.0000 mg | ORAL_TABLET | Freq: Every day | ORAL | 11 refills | Status: DC

## 2023-10-13 MED ORDER — WARFARIN SODIUM 5 MG PO TABS: 5.0000 mg | ORAL_TABLET | Freq: Every day | ORAL | 3 refills | Status: DC

## 2023-10-13 NOTE — Addendum Note (Signed)
 Addended by: Sherlene Shams on: 10/13/2023 09:51 AM   Modules accepted: Orders

## 2023-10-13 NOTE — Assessment & Plan Note (Signed)
 INR has jumped quite a bit and is now slightly high on 5 mg coumadin daily. Goal INR is 2.5 to 3.5 and current INR is 3.7 .  Please  start alternating 5 mg with 4 mg daily,  and recheck INR in 2 weeks .  I will send the 4 mg dose to local pharmacy  Lab Results  Component Value Date   INR 3.7 (H) 10/10/2023   INR 2.6 (H) 09/24/2023   INR 1.1 08/26/2023

## 2023-10-14 DIAGNOSIS — E1159 Type 2 diabetes mellitus with other circulatory complications: Secondary | ICD-10-CM | POA: Diagnosis not present

## 2023-10-14 DIAGNOSIS — I152 Hypertension secondary to endocrine disorders: Secondary | ICD-10-CM | POA: Diagnosis not present

## 2023-10-14 DIAGNOSIS — Z48812 Encounter for surgical aftercare following surgery on the circulatory system: Secondary | ICD-10-CM | POA: Diagnosis not present

## 2023-10-14 DIAGNOSIS — I059 Rheumatic mitral valve disease, unspecified: Secondary | ICD-10-CM | POA: Diagnosis not present

## 2023-10-14 DIAGNOSIS — R7881 Bacteremia: Secondary | ICD-10-CM | POA: Diagnosis not present

## 2023-10-14 DIAGNOSIS — Z452 Encounter for adjustment and management of vascular access device: Secondary | ICD-10-CM | POA: Diagnosis not present

## 2023-10-14 DIAGNOSIS — B954 Other streptococcus as the cause of diseases classified elsewhere: Secondary | ICD-10-CM | POA: Diagnosis not present

## 2023-10-14 DIAGNOSIS — I76 Septic arterial embolism: Secondary | ICD-10-CM | POA: Diagnosis not present

## 2023-10-14 DIAGNOSIS — I4891 Unspecified atrial fibrillation: Secondary | ICD-10-CM | POA: Diagnosis not present

## 2023-10-17 NOTE — Telephone Encounter (Signed)
 Copied from CRM [id number]. Topic: Clinical - Medication Question >> Oct 17, 2023 11:56 AM Sim Boast F wrote: Reason for CRM: patient spouse [name] requested call back regarding patients potassium and fluid pill, he runs out tomorrow and these were prescribed by [facility]. She wants to know if patient should continue taking these? If so he has no refills left. Please call [name] at [phone]

## 2023-10-18 MED ORDER — POTASSIUM CHLORIDE CRYS ER 20 MEQ PO TBCR: 40.0000 meq | EXTENDED_RELEASE_TABLET | Freq: Every day | ORAL | 0 refills | Status: DC

## 2023-10-18 MED ORDER — TORSEMIDE 20 MG PO TABS: 40.0000 mg | ORAL_TABLET | Freq: Two times a day (BID) | ORAL | 0 refills | Status: DC

## 2023-10-21 DIAGNOSIS — R7881 Bacteremia: Secondary | ICD-10-CM | POA: Diagnosis not present

## 2023-10-21 DIAGNOSIS — E1159 Type 2 diabetes mellitus with other circulatory complications: Secondary | ICD-10-CM | POA: Diagnosis not present

## 2023-10-21 DIAGNOSIS — I4891 Unspecified atrial fibrillation: Secondary | ICD-10-CM | POA: Diagnosis not present

## 2023-10-21 DIAGNOSIS — I059 Rheumatic mitral valve disease, unspecified: Secondary | ICD-10-CM | POA: Diagnosis not present

## 2023-10-21 DIAGNOSIS — Z452 Encounter for adjustment and management of vascular access device: Secondary | ICD-10-CM | POA: Diagnosis not present

## 2023-10-21 DIAGNOSIS — I152 Hypertension secondary to endocrine disorders: Secondary | ICD-10-CM | POA: Diagnosis not present

## 2023-10-21 DIAGNOSIS — Z48812 Encounter for surgical aftercare following surgery on the circulatory system: Secondary | ICD-10-CM | POA: Diagnosis not present

## 2023-10-21 DIAGNOSIS — B954 Other streptococcus as the cause of diseases classified elsewhere: Secondary | ICD-10-CM | POA: Diagnosis not present

## 2023-10-21 DIAGNOSIS — I76 Septic arterial embolism: Secondary | ICD-10-CM | POA: Diagnosis not present

## 2023-10-22 ENCOUNTER — Other Ambulatory Visit: Payer: Self-pay | Admitting: Internal Medicine

## 2023-10-22 DIAGNOSIS — M48061 Spinal stenosis, lumbar region without neurogenic claudication: Secondary | ICD-10-CM

## 2023-10-22 MED ORDER — TRAMADOL HCL 50 MG PO TABS: 50.0000 mg | ORAL_TABLET | Freq: Four times a day (QID) | ORAL | 2 refills | Status: DC | PRN

## 2023-10-22 NOTE — Telephone Encounter (Signed)
 Refilled: 04/02/2023 Last OV: 09/24/2023 Next OV: not scheduled

## 2023-10-23 DIAGNOSIS — R7881 Bacteremia: Secondary | ICD-10-CM | POA: Diagnosis not present

## 2023-10-23 DIAGNOSIS — Z452 Encounter for adjustment and management of vascular access device: Secondary | ICD-10-CM | POA: Diagnosis not present

## 2023-10-23 DIAGNOSIS — I152 Hypertension secondary to endocrine disorders: Secondary | ICD-10-CM | POA: Diagnosis not present

## 2023-10-23 DIAGNOSIS — Z48812 Encounter for surgical aftercare following surgery on the circulatory system: Secondary | ICD-10-CM | POA: Diagnosis not present

## 2023-10-23 DIAGNOSIS — B954 Other streptococcus as the cause of diseases classified elsewhere: Secondary | ICD-10-CM | POA: Diagnosis not present

## 2023-10-23 DIAGNOSIS — I76 Septic arterial embolism: Secondary | ICD-10-CM | POA: Diagnosis not present

## 2023-10-23 DIAGNOSIS — I4891 Unspecified atrial fibrillation: Secondary | ICD-10-CM | POA: Diagnosis not present

## 2023-10-23 DIAGNOSIS — I059 Rheumatic mitral valve disease, unspecified: Secondary | ICD-10-CM | POA: Diagnosis not present

## 2023-10-23 DIAGNOSIS — E1159 Type 2 diabetes mellitus with other circulatory complications: Secondary | ICD-10-CM | POA: Diagnosis not present

## 2023-10-24 DIAGNOSIS — I2510 Atherosclerotic heart disease of native coronary artery without angina pectoris: Secondary | ICD-10-CM | POA: Diagnosis not present

## 2023-10-24 DIAGNOSIS — I255 Ischemic cardiomyopathy: Secondary | ICD-10-CM | POA: Diagnosis not present

## 2023-10-25 ENCOUNTER — Other Ambulatory Visit: Payer: Self-pay | Admitting: Internal Medicine

## 2023-10-27 DIAGNOSIS — I76 Septic arterial embolism: Secondary | ICD-10-CM | POA: Diagnosis not present

## 2023-10-27 DIAGNOSIS — Z452 Encounter for adjustment and management of vascular access device: Secondary | ICD-10-CM | POA: Diagnosis not present

## 2023-10-27 DIAGNOSIS — E1165 Type 2 diabetes mellitus with hyperglycemia: Secondary | ICD-10-CM | POA: Diagnosis not present

## 2023-10-27 DIAGNOSIS — E1159 Type 2 diabetes mellitus with other circulatory complications: Secondary | ICD-10-CM | POA: Diagnosis not present

## 2023-10-27 DIAGNOSIS — R7881 Bacteremia: Secondary | ICD-10-CM | POA: Diagnosis not present

## 2023-10-27 DIAGNOSIS — B954 Other streptococcus as the cause of diseases classified elsewhere: Secondary | ICD-10-CM | POA: Diagnosis not present

## 2023-10-27 DIAGNOSIS — I4891 Unspecified atrial fibrillation: Secondary | ICD-10-CM | POA: Diagnosis not present

## 2023-10-27 DIAGNOSIS — Z48812 Encounter for surgical aftercare following surgery on the circulatory system: Secondary | ICD-10-CM | POA: Diagnosis not present

## 2023-10-27 DIAGNOSIS — I152 Hypertension secondary to endocrine disorders: Secondary | ICD-10-CM | POA: Diagnosis not present

## 2023-10-27 DIAGNOSIS — I059 Rheumatic mitral valve disease, unspecified: Secondary | ICD-10-CM | POA: Diagnosis not present

## 2023-10-30 ENCOUNTER — Encounter: Payer: Self-pay | Admitting: Internal Medicine

## 2023-10-30 ENCOUNTER — Other Ambulatory Visit (INDEPENDENT_AMBULATORY_CARE_PROVIDER_SITE_OTHER)

## 2023-10-30 DIAGNOSIS — Z5181 Encounter for therapeutic drug level monitoring: Secondary | ICD-10-CM | POA: Diagnosis not present

## 2023-10-30 DIAGNOSIS — Z7901 Long term (current) use of anticoagulants: Secondary | ICD-10-CM

## 2023-10-30 LAB — PROTIME-INR
INR: 2.2 ratio — ABNORMAL HIGH (ref 0.8–1.0)
Prothrombin Time: 22.8 s — ABNORMAL HIGH (ref 9.6–13.1)

## 2023-10-30 NOTE — Addendum Note (Signed)
 Addended by: Thersia Flax on: 10/30/2023 09:52 PM   Modules accepted: Orders

## 2023-11-03 ENCOUNTER — Encounter: Payer: Self-pay | Admitting: Internal Medicine

## 2023-11-06 ENCOUNTER — Other Ambulatory Visit (INDEPENDENT_AMBULATORY_CARE_PROVIDER_SITE_OTHER)

## 2023-11-06 DIAGNOSIS — Z5181 Encounter for therapeutic drug level monitoring: Secondary | ICD-10-CM | POA: Diagnosis not present

## 2023-11-06 DIAGNOSIS — Z7901 Long term (current) use of anticoagulants: Secondary | ICD-10-CM | POA: Diagnosis not present

## 2023-11-06 LAB — PROTIME-INR
INR: 2.8 ratio — ABNORMAL HIGH (ref 0.8–1.0)
Prothrombin Time: 28.2 s — ABNORMAL HIGH (ref 9.6–13.1)

## 2023-11-07 ENCOUNTER — Encounter: Payer: Self-pay | Admitting: Internal Medicine

## 2023-11-25 DIAGNOSIS — L853 Xerosis cutis: Secondary | ICD-10-CM | POA: Diagnosis not present

## 2023-11-25 DIAGNOSIS — L2089 Other atopic dermatitis: Secondary | ICD-10-CM | POA: Diagnosis not present

## 2023-11-27 DIAGNOSIS — E1165 Type 2 diabetes mellitus with hyperglycemia: Secondary | ICD-10-CM | POA: Diagnosis not present

## 2023-11-29 ENCOUNTER — Other Ambulatory Visit: Payer: Self-pay | Admitting: Internal Medicine

## 2023-12-09 ENCOUNTER — Telehealth: Payer: Self-pay

## 2023-12-09 ENCOUNTER — Other Ambulatory Visit (INDEPENDENT_AMBULATORY_CARE_PROVIDER_SITE_OTHER)

## 2023-12-09 DIAGNOSIS — Z5181 Encounter for therapeutic drug level monitoring: Secondary | ICD-10-CM

## 2023-12-09 DIAGNOSIS — Z7901 Long term (current) use of anticoagulants: Secondary | ICD-10-CM

## 2023-12-09 LAB — PROTIME-INR
INR: 2.4 ratio — ABNORMAL HIGH (ref 0.8–1.0)
Prothrombin Time: 24 s — ABNORMAL HIGH (ref 9.6–13.1)

## 2023-12-09 NOTE — Telephone Encounter (Signed)
Lab order placed for lab appt.  °

## 2023-12-09 NOTE — Addendum Note (Signed)
 Addended by: Sade Hollon on: 12/09/2023 02:13 PM   Modules accepted: Orders

## 2023-12-10 ENCOUNTER — Ambulatory Visit: Payer: Self-pay | Admitting: Internal Medicine

## 2023-12-10 NOTE — Telephone Encounter (Signed)
 Increase dose to 6 mg on Wednesdays and Saturdays oNLY,   CONTINUE 5 MG ALL OTHER DAYS.Connor Ross  HE CAN USE THE 2 MG TABLETS TO DO SO , OR WE CAN SEND IN 6 MG TABLETS

## 2023-12-10 NOTE — Telephone Encounter (Signed)
 He does not do [facility] he is comes here to have his INR drawn.

## 2023-12-10 NOTE — Telephone Encounter (Signed)
 Spoke with pt and to inform him and his wife of the dose change. I have scheduled him for repeat INR in one week. If this is not correct I will call him back to change the appt date.

## 2023-12-11 NOTE — Telephone Encounter (Signed)
 Appt is scheduled for June 4th

## 2023-12-17 ENCOUNTER — Other Ambulatory Visit (INDEPENDENT_AMBULATORY_CARE_PROVIDER_SITE_OTHER)

## 2023-12-17 DIAGNOSIS — Z7901 Long term (current) use of anticoagulants: Secondary | ICD-10-CM

## 2023-12-17 DIAGNOSIS — Z5181 Encounter for therapeutic drug level monitoring: Secondary | ICD-10-CM

## 2023-12-17 NOTE — Addendum Note (Signed)
 Addended by: Gradie Lawless A on: 12/17/2023 03:03 PM   Modules accepted: Orders

## 2023-12-18 LAB — PROTIME-INR
INR: 2.3 — ABNORMAL HIGH (ref 0.9–1.2)
Prothrombin Time: 24.5 s — ABNORMAL HIGH (ref 9.1–12.0)

## 2023-12-18 LAB — SPECIMEN STATUS REPORT

## 2023-12-19 ENCOUNTER — Other Ambulatory Visit: Payer: Self-pay | Admitting: Internal Medicine

## 2023-12-19 DIAGNOSIS — Z5181 Encounter for therapeutic drug level monitoring: Secondary | ICD-10-CM

## 2023-12-19 MED ORDER — WARFARIN SODIUM 5 MG PO TABS: 5.0000 mg | ORAL_TABLET | Freq: Every day | ORAL | 3 refills | Status: DC

## 2023-12-19 MED ORDER — WARFARIN SODIUM 6 MG PO TABS: 6.0000 mg | ORAL_TABLET | Freq: Every day | ORAL | 2 refills | Status: AC

## 2023-12-19 NOTE — Assessment & Plan Note (Signed)
 INR is subtherapeutic on  5 mg coumadin  daily. Goal INR is 2.5 to 3.5 . Regimen changed to 5 mg alternating with 6 mg and recheck INR in 2 weeks .  I will send the 6 mg dose to local pharmacy  Lab Results  Component Value Date   INR 2.3 (H) 12/17/2023   INR 2.4 (H) 12/09/2023   INR 2.8 (H) 11/06/2023

## 2023-12-25 DIAGNOSIS — Z952 Presence of prosthetic heart valve: Secondary | ICD-10-CM | POA: Diagnosis not present

## 2023-12-25 LAB — POCT INR

## 2023-12-26 ENCOUNTER — Telehealth: Payer: Self-pay

## 2023-12-26 ENCOUNTER — Ambulatory Visit: Payer: Self-pay | Admitting: Internal Medicine

## 2023-12-26 MED ORDER — ALPRAZOLAM 0.25 MG PO TABS: 0.2500 mg | ORAL_TABLET | Freq: Two times a day (BID) | ORAL | 0 refills | Status: DC | PRN

## 2023-12-26 NOTE — Telephone Encounter (Signed)
 Spoke with pt about his INR because we couldn't load the result. Pt stated that he could not remember the exact number but stated that it was between 2.5 and 3. Per Dr. [name] pt was advised to continue his current regimen. While on the phone with the pt he stated that his dog passed away yesterday and he is having a very hard time with it. Pt would like to know if there is something that Dr. [name] could send in for him to take.

## 2023-12-28 DIAGNOSIS — E1165 Type 2 diabetes mellitus with hyperglycemia: Secondary | ICD-10-CM | POA: Diagnosis not present

## 2024-01-01 LAB — POCT INR

## 2024-01-02 NOTE — Telephone Encounter (Signed)
 Spoke with pt's wife to inform her of the regimen change. Wife read instructions back to me with understanding.

## 2024-01-06 DIAGNOSIS — R600 Localized edema: Secondary | ICD-10-CM | POA: Diagnosis not present

## 2024-01-06 DIAGNOSIS — Z952 Presence of prosthetic heart valve: Secondary | ICD-10-CM | POA: Diagnosis not present

## 2024-01-06 DIAGNOSIS — I152 Hypertension secondary to endocrine disorders: Secondary | ICD-10-CM | POA: Diagnosis not present

## 2024-01-06 DIAGNOSIS — G4733 Obstructive sleep apnea (adult) (pediatric): Secondary | ICD-10-CM | POA: Diagnosis not present

## 2024-01-06 DIAGNOSIS — E782 Mixed hyperlipidemia: Secondary | ICD-10-CM | POA: Diagnosis not present

## 2024-01-06 DIAGNOSIS — E1169 Type 2 diabetes mellitus with other specified complication: Secondary | ICD-10-CM | POA: Diagnosis not present

## 2024-01-06 DIAGNOSIS — I252 Old myocardial infarction: Secondary | ICD-10-CM | POA: Diagnosis not present

## 2024-01-06 DIAGNOSIS — E1159 Type 2 diabetes mellitus with other circulatory complications: Secondary | ICD-10-CM | POA: Diagnosis not present

## 2024-01-06 DIAGNOSIS — I2510 Atherosclerotic heart disease of native coronary artery without angina pectoris: Secondary | ICD-10-CM | POA: Diagnosis not present

## 2024-01-08 ENCOUNTER — Encounter: Payer: Self-pay | Admitting: Internal Medicine

## 2024-01-08 DIAGNOSIS — Z7901 Long term (current) use of anticoagulants: Secondary | ICD-10-CM

## 2024-01-08 NOTE — Assessment & Plan Note (Signed)
 INR is  at goal ,  2.7 on regimen of warfarin 5 mg alt with 6 mg daily .  No changes today

## 2024-01-14 ENCOUNTER — Other Ambulatory Visit: Payer: Self-pay

## 2024-01-14 DIAGNOSIS — E1169 Type 2 diabetes mellitus with other specified complication: Secondary | ICD-10-CM

## 2024-01-15 ENCOUNTER — Telehealth: Payer: Self-pay

## 2024-01-15 ENCOUNTER — Ambulatory Visit: Payer: Self-pay

## 2024-01-15 ENCOUNTER — Encounter: Payer: Self-pay | Admitting: Internal Medicine

## 2024-01-15 DIAGNOSIS — Z5181 Encounter for therapeutic drug level monitoring: Secondary | ICD-10-CM

## 2024-01-15 DIAGNOSIS — Z952 Presence of prosthetic heart valve: Secondary | ICD-10-CM | POA: Diagnosis not present

## 2024-01-15 NOTE — Telephone Encounter (Signed)
 FYI Only or Action Required?: Action required by provider: update on patient condition.  Patient was last seen in primary care on 10/03/2023 by Marylynn Verneita CROME, MD. Called Nurse Triage reporting lab result. Symptoms began today. Interventions attempted: Nothing. Symptoms are: new result, reported to [facility].  Triage Disposition: Call PCP Now  Patient/caregiver understands and will follow disposition?: Yes   Reason for Triage: INR 4.1 level,no symptoms  Cb#:[phone] wife Darice   Reason for Disposition  [1] Follow-up call from patient regarding patient's clinical status AND [2] information urgent  Answer Assessment - Initial Assessment Questions 1. REASON FOR CALL or QUESTION: What is your reason for calling today? or How can I best help you? or What question do you have that I can help answer?     Patient's wife called to report INR of 4.1 on COUMADIN , no symptoms.  Call routed to [facility] and ultimately transferred to [facility] in office  Answer Assessment - Initial Assessment Questions 1. REASON FOR CALL or QUESTION: What is your reason for calling today? or How can I best help you? or What question do you have that I can help answer?     Home INR test result of 4.1 patient on COUMADIN  2. CALLER: Document the source of call. (e.g., laboratory, patient).     Patient reported.  [facility] called, transferred to [facility] in office  Protocols used: Information Only Call - No Triage-A-AH, PCP Call - No Triage-A-AH

## 2024-01-15 NOTE — Telephone Encounter (Signed)
 Reviewed chart.  Per note, INR goal 2.5-3.5.  currently on 5mg  alternating with 6 mg every other day. It appears just started on this dose around 01/02/24. Previously on 6mg  q day - INR too high. Now too high on alternating doses. Took 6mg  last night.  Have him hold coumadin  tonight and then take 6mg  on Monday and Thursday and 5mg  all other days.  Recheck pt/inr Tuesday 01/20/24.  Please forward to Dr Marylynn to make her aware of change.

## 2024-01-15 NOTE — Telephone Encounter (Signed)
 See triage note.

## 2024-01-15 NOTE — Assessment & Plan Note (Signed)
 INR is 4.1 on regimen of 5 mg alternating with 6 mg.  Advised to suspend coumadin  for one day,  followed by resumeing with a regimen of 5 mg daily .  Recheck inr after 7 days

## 2024-01-15 NOTE — Telephone Encounter (Signed)
 Spoke with pt's wife and advised her of the new coumadin  regimen. She repeated it back to me with understanding.

## 2024-01-15 NOTE — Telephone Encounter (Signed)
 Spoke with pt's wife Darice and she stated that pt's INR this morning was 4.1. He was 2.6 last week. No medication changes, no antibiotics. Pt is not currently sick and pt's wife stated that he has not been eating as many greens as he used to. Pt's current regimen is alternating 5 mg and 6 mg. Pt's dose last night was 6 mg.

## 2024-01-15 NOTE — Telephone Encounter (Signed)
 Copied from CRM [id number]. Topic: General - Other >> Jan 15, 2024  3:45 PM Rosina BIRCH wrote: Reason for CRM: Clint from [facility] called stating the patient INR report is a 4.1 CB [phone]

## 2024-01-20 ENCOUNTER — Telehealth: Payer: Self-pay

## 2024-01-20 NOTE — Telephone Encounter (Signed)
 Spoke with pt's wife to let her now that pt's patient assistance medication has been received and ready for pick up.    Pen Needles: 4 boxes Novolog : 8 bpxes Ozempic : 4 boxes

## 2024-01-22 ENCOUNTER — Encounter: Payer: Self-pay | Admitting: Internal Medicine

## 2024-01-22 LAB — POCT INR

## 2024-01-23 ENCOUNTER — Telehealth: Payer: Self-pay

## 2024-01-23 NOTE — Telephone Encounter (Signed)
 Noted

## 2024-01-23 NOTE — Telephone Encounter (Signed)
 Copied from CRM [id number]. Topic: General - Other >> Jan 23, 2024  9:36 AM Berneda FALCON wrote: Reason for CRM: Joen from [facility] calling to report INR for this patient for yesterday being 2.1 (01/22/2024)

## 2024-01-23 NOTE — Telephone Encounter (Signed)
 Patient picked up all patient assistance medications.

## 2024-01-26 ENCOUNTER — Ambulatory Visit: Payer: Medicare HMO | Admitting: *Deleted

## 2024-01-26 VITALS — Ht 71.0 in | Wt 300.0 lb

## 2024-01-26 DIAGNOSIS — Z0000 Encounter for general adult medical examination without abnormal findings: Secondary | ICD-10-CM | POA: Diagnosis not present

## 2024-01-26 NOTE — Progress Notes (Signed)
 Subjective:   Connor Ross is a 58 y.o. who presents for a Medicare Wellness preventive visit.  As a reminder, Annual Wellness Visits don't include a physical exam, and some assessments may be limited, especially if this visit is performed virtually. We may recommend an in-person follow-up visit with your provider if needed.  Visit Complete: Virtual I connected with  Connor Ross on 01/26/24 by a audio enabled telemedicine application and verified that I am speaking with the correct person using two identifiers.  Patient Location: Home  Provider Location: Home Office  I discussed the limitations of evaluation and management by telemedicine. The patient expressed understanding and agreed to proceed.  Vital Signs: Because this visit was a virtual/telehealth visit, some criteria may be missing or patient reported. Any vitals not documented were not able to be obtained and vitals that have been documented are patient reported.  VideoDeclined- This patient declined Librarian, academic. Therefore the visit was completed with audio only.  Persons Participating in Visit: Patient assisted by wife Connor Ross.  AWV Questionnaire: Yes: Patient Medicare AWV questionnaire was completed by the patient on 01/22/24; I have confirmed that all information answered by patient is correct and no changes since this date.  Cardiac Risk Factors include: advanced age (>51men, >64 women);diabetes mellitus;dyslipidemia;obesity (BMI >30kg/m2);hypertension;male gender;sedentary lifestyle     Objective:    Today's Vitals   01/26/24 1425 01/26/24 1426  Weight: 300 lb (136.1 kg)   Height: 5' 11 (1.803 m)   PainSc:  5    Body mass index is 41.84 kg/m.     01/26/2024    2:57 PM 08/25/2023    9:18 PM 01/21/2023   11:30 AM 06/25/2022    6:45 PM 06/23/2022   10:18 PM 01/07/2022    1:08 PM 01/03/2021    2:18 PM  Advanced Directives  Does Patient Have a Medical Advance  Directive? Yes No No  No No No  Type of Estate agent of [place];Living will        Copy of Healthcare Power of Attorney in Chart? No - copy requested        Would patient like information on creating a medical advance directive?    No - Patient declined  No - Patient declined No - Patient declined    Current Medications (verified) Outpatient Encounter Medications as of 01/26/2024  Medication Sig   ALPRAZolam  (XANAX ) 0.25 MG tablet Take 1 tablet (0.25 mg total) by mouth 2 (two) times daily as needed for anxiety.   ASPIRIN  LOW DOSE 81 MG EC tablet TAKE 1 TABLET BY MOUTH  DAILY (SWALLOW WHOLE)   blood glucose meter kit and supplies Dispense based on patient and insurance preference. Use up to four times daily as directed. (FOR ICD-10 E10.9, E11.9). Use to check blood sugar three times daily   cefTRIAXone  2 g in sodium chloride  0.9 % 100 mL Inject 2 g into the vein every 12 (twelve) hours.   clobetasol cream (TEMOVATE) 0.05 % Apply 1 Application topically 2 (two) times daily.   Continuous Blood Gluc Sensor (FREESTYLE LIBRE 2 SENSOR) MISC Use to check sugar at least 4 times daily   furosemide  (LASIX ) 20 MG tablet Take 20 mg by mouth every other day.   gabapentin  (NEURONTIN ) 100 MG capsule TAKE 2 CAPSULES THREE TIMES DAILY   insulin  aspart (NOVOLOG  FLEXPEN) 100 UNIT/ML FlexPen Inject up to 25 units with breakfast, 25 units with lunch, and 30 units with supper   insulin   degludec (TRESIBA  FLEXTOUCH) 200 UNIT/ML FlexTouch Pen Inject 88 Units into the skin daily.   Insulin  Pen Needle 32G X 6 MM MISC Use daily with insulin  pen   ketoconazole (NIZORAL) 2 % cream SMARTSIG:1 Application Topical 1 to 2 Times Daily (Patient taking differently: daily as needed.)   Lancets (FREESTYLE) lancets 1 each by Other route 2 (two) times daily. DX 250.02   metoprolol  tartrate (LOPRESSOR ) 25 MG tablet Take 12.5 mg by mouth 2 (two) times daily.   ONETOUCH VERIO test strip USE TO CHECK BLOOD SUGAR 3   TIMES DAILY   potassium chloride  SA (KLOR-CON  M) 20 MEQ tablet Take 2 tablets (40 mEq total) by mouth daily.   rosuvastatin  (CRESTOR ) 40 MG tablet Take 1 tablet (40 mg total) by mouth every evening for 360 doses.   Semaglutide , 2 MG/DOSE, (OZEMPIC , 2 MG/DOSE,) 8 MG/3ML SOPN Inject 2 mg into the skin once a week.   senna-docusate (SENOKOT-S) 8.6-50 MG tablet Take 2 tablets by mouth as needed.   spironolactone (ALDACTONE) 25 MG tablet Take 1 tablet by mouth daily.   tamsulosin  (FLOMAX ) 0.4 MG CAPS capsule TAKE 1 CAPSULE EVERY DAY   traMADol  (ULTRAM ) 50 MG tablet Take 1 tablet (50 mg total) by mouth every 6 (six) hours as needed.   triamcinolone  (KENALOG) 0.025 % cream Apply topically.   Vitamin D , Ergocalciferol , (DRISDOL ) 1.25 MG (50000 UNIT) CAPS capsule Take 50,000 Units by mouth once a week.   warfarin (COUMADIN ) 5 MG tablet Take 1 tablet (5 mg total) by mouth daily. Alternating with 6 mg daily   warfarin (COUMADIN ) 6 MG tablet Take 1 tablet (6 mg total) by mouth daily. Alternating with 5 mg daily   empagliflozin  (JARDIANCE ) 25 MG TABS tablet Take 1 tablet (25 mg total) by mouth daily. (Patient not taking: Reported on 01/26/2024)   fluticasone  (FLONASE ) 50 MCG/ACT nasal spray PLACE 2 SPRAYS INTO BOTH NOSTRILS DAILY. AT NIGHT AFTER NASAL SALINE (Patient not taking: Reported on 01/26/2024)   metoprolol  tartrate (LOPRESSOR ) 50 MG tablet TAKE 1 TABLET TWICE DAILY (Patient not taking: Reported on 01/26/2024)   polyethylene glycol (MIRALAX / GLYCOLAX) 17 g packet Take 17 g by mouth daily as needed. (Patient not taking: Reported on 01/26/2024)   torsemide  (DEMADEX ) 20 MG tablet Take 2 tablets (40 mg total) by mouth 2 (two) times daily. (Patient not taking: Reported on 01/26/2024)   No facility-administered encounter medications on file as of 01/26/2024.    Allergies (verified) Statins, Hctz [hydrochlorothiazide ], and Metformin  and related   History: Past Medical History:  Diagnosis Date   Diabetes  mellitus    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 type 2 diabetes mellitus, limited to breakdown of skin (HCC) 04/23/2019   Fracture of one rib, left side, subsequent encounter for fracture with routine healing 08/27/2020   Hyperlipidemia    Hypertension    Influenza A 08/26/2023   Normal cardiac stress test 2012   Fath   NSTEMI (non-ST elevated myocardial infarction) (HCC) 08/26/2023   Sepsis due to pneumonia (HCC) 08/26/2023   Septicemia due to group D Streptococcus (HCC) 08/26/2023   Past Surgical History:  Procedure Laterality Date   CARDIAC VALVE REPLACEMENT N/A 09/10/2023   CORONARY STENT INTERVENTION N/A 06/25/2022   Procedure: CORONARY STENT INTERVENTION;  Surgeon: Florencio Cara BIRCH, MD;  Location: [facility];  Service: Cardiovascular;  Laterality: N/A;   CORONARY STENT INTERVENTION N/A 06/26/2022   Procedure: CORONARY STENT INTERVENTION;  Surgeon: Florencio Cara BIRCH, MD;  Location: [facility];  Service: Cardiovascular;  Laterality: N/A;   EXOSTECTOMY Right 06/2019   5th MT head  ([facility])   LEFT HEART CATH AND CORONARY ANGIOGRAPHY N/A 06/25/2022   Procedure: LEFT HEART CATH AND CORONARY ANGIOGRAPHY WITH POSSIBLE STENT;  Surgeon: Florencio Cara BIRCH, MD;  Location: [facility];  Service: Cardiovascular;  Laterality: N/A;  12:30 following DC first case   LUMBAR DISC SURGERY     TEE WITHOUT CARDIOVERSION Right 09/02/2023   Procedure: TRANSESOPHAGEAL ECHOCARDIOGRAM (TEE);  Surgeon: Alluri, Keller BROCKS, MD;  Location: [facility];  Service: Cardiovascular;  Laterality: Right;   Family History  Problem Relation Age of Onset   Coronary artery disease Mother 4   COPD Mother    Heart disease Mother    Hyperlipidemia Mother    Hypertension Mother    Cancer Father    Hyperlipidemia Father    Hypertension Brother    Cancer Brother        lymphoma   Diabetes Maternal Aunt    Diabetes Maternal Uncle    Diabetes Maternal Grandmother     Heart disease Maternal Grandmother    Social History   Socioeconomic History   Marital status: Married    Spouse name: Not on file   Number of children: Not on file   Years of education: Not on file   Highest education level: 10th grade  Occupational History   Not on file  Tobacco Use   Smoking status: Never   Smokeless tobacco: Never  Substance and Sexual Activity   Alcohol use: No   Drug use: No   Sexual activity: Not on file  Other Topics Concern   Not on file  Social History Narrative   Married   Social Drivers of Health   Financial Resource Strain: Low Risk  (01/22/2024)   Overall Financial Resource Strain (CARDIA)    Difficulty of Paying Living Expenses: Not very hard  Food Insecurity: No Food Insecurity (01/22/2024)   Hunger Vital Sign    Worried About Running Out of Food in the Last Year: Never true    Ran Out of Food in the Last Year: Never true  Transportation Needs: No Transportation Needs (01/22/2024)   PRAPARE - Administrator, Civil Service (Medical): No    Lack of Transportation (Non-Medical): No  Physical Activity: Inactive (01/22/2024)   Exercise Vital Sign    Days of Exercise per Week: 0 days    Minutes of Exercise per Session: 0 min  Stress: No Stress Concern Present (01/22/2024)   [employer] of Occupational Health - Occupational Stress Questionnaire    Feeling of Stress: Not at all  Social Connections: Moderately Isolated (01/22/2024)   Social Connection and Isolation Panel    Frequency of Communication with Friends and Family: Once a week    Frequency of Social Gatherings with Friends and Family: More than three times a week    Attends Religious Services: Never    Database administrator or Organizations: No    Attends Engineer, structural: Never    Marital Status: Married    Tobacco Counseling Counseling given: Not Answered    Clinical Intake:  Pre-visit preparation completed: Yes  Pain : 0-10 Pain Score: 5   Pain Type: Chronic pain Pain Location: Back (hip) Pain Orientation: Left, Right Pain Descriptors / Indicators: Aching Pain Onset: More than a month ago Pain Frequency: Constant     BMI - recorded: 41.84 Nutritional Status: BMI >  30  Obese Nutritional Risks: None Diabetes: Yes (per patient FBS 199)  Lab Results  Component Value Date   HGBA1C 7.9 (A) 09/24/2023   HGBA1C 5.9 07/04/2023   HGBA1C 7.9 (H) 07/02/2023     How often do you need to have someone help you when you read instructions, pamphlets, or other written materials from your doctor or pharmacy?: 1 - Never  Interpreter Needed?: No  Information entered by :: R. Krystale Rinkenberger LPN   Activities of Daily Living     01/22/2024   11:36 AM  In your present state of health, do you have any difficulty performing the following activities:  Hearing? 0  Vision? 0  Difficulty concentrating or making decisions? 1  Walking or climbing stairs? 1  Dressing or bathing? 1  Doing errands, shopping? 1  Preparing Food and eating ? N  Using the Toilet? Y  In the past six months, have you accidently leaked urine? Y  Do you have problems with loss of bowel control? N  Managing your Medications? N  Managing your Finances? N  Housekeeping or managing your Housekeeping? Y    Patient Care Team: Marylynn Verneita CROME, MD as PCP - [facility] Od Sawyer, Cara BIRCH, MD as Consulting Physician (Cardiology)  I have updated your Care Teams any recent Medical Services you may have received from other providers in the past year.     Assessment:   This is a routine wellness examination for Connor Ross.  Hearing/Vision screen Hearing Screening - Comments:: No issues Vision Screening - Comments:: glasses   Goals Addressed             This Visit's Progress    Patient Stated       Wants to be able to walk without assistance       Depression Screen     01/26/2024    2:50 PM 09/24/2023   11:46 AM  07/04/2023    2:11 PM 04/02/2023    3:11 PM 01/21/2023   11:25 AM 12/31/2022    3:22 PM 10/01/2022    2:28 PM  PHQ 2/9 Scores  PHQ - 2 Score 1 6 0 0 0 0 0  PHQ- 9 Score 2 21   1       Fall Risk     01/22/2024   11:36 AM 10/03/2023    1:52 PM 09/24/2023   11:46 AM 07/04/2023    2:11 PM 04/02/2023    3:11 PM  Fall Risk   Falls in the past year? 0 0 0 1   Number falls in past yr: 0 0 0 0 0  Injury with Fall? 0 0 0 0 0  Risk for fall due to : No Fall Risks No Fall Risks No Fall Risks History of fall(s) No Fall Risks  Follow up Falls evaluation completed;Falls prevention discussed Falls evaluation completed Falls evaluation completed Falls evaluation completed Falls evaluation completed    MEDICARE RISK AT HOME:  Medicare Risk at Home Any stairs in or around the home?: (Patient-Rptd) Yes If so, are there any without handrails?: (Patient-Rptd) Yes Home free of loose throw rugs in walkways, pet beds, electrical cords, etc?: (Patient-Rptd) No Adequate lighting in your home to reduce risk of falls?: (Patient-Rptd) Yes Life alert?: (Patient-Rptd) No Use of a cane, walker or w/c?: (Patient-Rptd) Yes Grab bars in the bathroom?: (Patient-Rptd) Yes Shower chair or bench in shower?: (Patient-Rptd) No Elevated toilet seat or a handicapped toilet?: (Patient-Rptd) No  TIMED UP  AND GO:  Was the test performed?  No  Cognitive Function: 6CIT completed        01/26/2024    2:57 PM 01/21/2023   11:31 AM 01/03/2021    2:32 PM 01/03/2020    2:23 PM  6CIT Screen  What Year? 0 points 0 points 0 points 0 points  What month? 0 points 0 points 0 points 0 points  What time? 0 points 0 points    Count back from 20 0 points 0 points    Months in reverse 0 points 0 points 0 points 0 points  Repeat phrase 2 points 0 points 0 points 0 points  Total Score 2 points 0 points      Immunizations Immunization History  Administered Date(s) Administered   Tdap 10/16/2011, 09/11/2021    Screening  Tests Health Maintenance  Topic Date Due   Diabetic kidney evaluation - Urine ACR  Never done   Hepatitis B Vaccines (1 of 3 - 19+ 3-dose series) Never done   Medicare Annual Wellness (AWV)  01/21/2024   INFLUENZA VACCINE  02/13/2024   HEMOGLOBIN A1C  03/26/2024   FOOT EXAM  04/01/2024   Fecal DNA (Cologuard)  04/19/2024   OPHTHALMOLOGY EXAM  08/10/2024   Diabetic kidney evaluation - eGFR measurement  10/09/2024   DTaP/Tdap/Td (3 - Td or Tdap) 09/12/2031   Hepatitis C Screening  Completed   HIV Screening  Completed   HPV VACCINES  Aged Out   Meningococcal B Vaccine  Aged Out   Pneumococcal Vaccine 74-31 Years old  Discontinued   COVID-19 Vaccine  Discontinued   Zoster Vaccines- Shingrix  Discontinued    Health Maintenance  Health Maintenance Due  Topic Date Due   Diabetic kidney evaluation - Urine ACR  Never done   Hepatitis B Vaccines (1 of 3 - 19+ 3-dose series) Never done   Medicare Annual Wellness (AWV)  01/21/2024   Health Maintenance Items Addressed: Patient declines all vaccines.   Additional Screening:  Vision Screening: Recommended annual ophthalmology exams for early detection of glaucoma and other disorders of the eye. Up to date [facility] Would you like a referral to an eye doctor? No    Dental Screening: Recommended annual dental exams for proper oral hygiene  Community Resource Referral / Chronic Care Management: CRR required this visit?  No   CCM required this visit?  No   Plan:    I have personally reviewed and noted the following in the patient's chart:   Medical and social history Use of alcohol, tobacco or illicit drugs  Current medications and supplements including opioid prescriptions. Patient is currently taking opioid prescriptions. Information provided to patient regarding non-opioid alternatives. Patient advised to discuss non-opioid treatment plan with their provider. Functional ability and status Nutritional status Physical  activity Advanced directives List of other physicians Hospitalizations, surgeries, and ER visits in previous 12 months Vitals Screenings to include cognitive, depression, and falls Referrals and appointments  In addition, I have reviewed and discussed with patient certain preventive protocols, quality metrics, and best practice recommendations. A written personalized care plan for preventive services as well as general preventive health recommendations were provided to patient.   Angeline Fredericks, LPN   [id number]   After Visit Summary: (MyChart) Due to this being a telephonic visit, the after visit summary with patients personalized plan was offered to patient via MyChart   Notes: Nothing significant to report at this time.

## 2024-01-26 NOTE — Patient Instructions (Signed)
 Connor Ross , Thank you for taking time out of your busy schedule to complete your Annual Wellness Visit with me. I enjoyed our conversation and look forward to speaking with you again next year. I, as well as your care team,  appreciate your ongoing commitment to your health goals. Please review the following plan we discussed and let me know if I can assist you in the future. Your Game plan/ To Do List    Referrals: If you haven't heard from the office you've been referred to, please reach out to them at the phone provided.  Consider updating your vaccines Follow up Visits: Next Medicare AWV with our clinical staff: 01/31/25 @ 3:40   Have you seen your provider in the last 6 months (3 months if uncontrolled diabetes)? Yes Next Office Visit with your provider: Office is to call and schedule appointment  Clinician Recommendations:  Aim for 30 minutes of exercise or brisk walking, 6-8 glasses of water, and 5 servings of fruits and vegetables each day.       This is a list of the screening recommended for you and due dates:  Health Maintenance  Topic Date Due   Yearly kidney health urinalysis for diabetes  Never done   Hepatitis B Vaccine (1 of 3 - 19+ 3-dose series) Never done   Flu Shot  02/13/2024   Hemoglobin A1C  03/26/2024   Complete foot exam   04/01/2024   Cologuard (Stool DNA test)  04/19/2024   Eye exam for diabetics  08/10/2024   Yearly kidney function blood test for diabetes  10/09/2024   Medicare Annual Wellness Visit  01/25/2025   DTaP/Tdap/Td vaccine (3 - Td or Tdap) 09/12/2031   Hepatitis C Screening  Completed   HIV Screening  Completed   HPV Vaccine  Aged Out   Meningitis B Vaccine  Aged Out   Pneumococcal Vaccination  Discontinued   COVID-19 Vaccine  Discontinued   Zoster (Shingles) Vaccine  Discontinued    Advanced directives: (ACP Link)Information on Advanced Care Planning can be found at [place]  Secretary of [facility] Advance Health Care Directives  Advance Health Care Directives. [email or web address]  Advance Care Planning is important because it:  [x]  Makes sure you receive the medical care that is consistent with your values, goals, and preferences  [x]  It provides guidance to your family and loved ones and reduces their decisional burden about whether or not they are making the right decisions based on your wishes.  Follow the link provided in your after visit summary or read over the paperwork we have mailed to you to help you started getting your Advance Directives in place. If you need assistance in completing these, please reach out to us  so that we can help you! Managing Pain Without Opioids Opioids are strong medicines used to treat moderate to severe pain. For some people, especially those who have long-term (chronic) pain, opioids may not be the best choice for pain management due to: Side effects like nausea, constipation, and sleepiness. The risk of addiction (opioid use disorder). The longer you take opioids, the greater your risk of addiction. Pain that lasts for more than 3 months is called chronic pain. Managing chronic pain usually requires more than one approach and is often provided by a team of health care providers working together (multidisciplinary approach). Pain management may be done at a pain management center or pain clinic. How to manage pain without the use of opioids Use non-opioid medicines Non-opioid medicines for pain may  include: Over-the-counter or prescription non-steroidal anti-inflammatory drugs (NSAIDs). These may be the first medicines used for pain. They work well for muscle and bone pain, and they reduce swelling. Acetaminophen . This over-the-counter medicine may work well for milder pain but not swelling. Antidepressants. These may be used to treat chronic pain. A certain type of antidepressant (tricyclics) is often used. These medicines are given in lower doses for pain than when used for  depression. Anticonvulsants. These are usually used to treat seizures but may also reduce nerve (neuropathic) pain. Muscle relaxants. These relieve pain caused by sudden muscle tightening (spasms). You may also use a pain medicine that is applied to the skin as a patch, cream, or gel (topical analgesic), such as a numbing medicine. These may cause fewer side effects than medicines taken by mouth. Do certain therapies as directed Some therapies can help with pain management. They include: Physical therapy. You will do exercises to gain strength and flexibility. A physical therapist may teach you exercises to move and stretch parts of your body that are weak, stiff, or painful. You can learn these exercises at physical therapy visits and practice them at home. Physical therapy may also involve: Massage. Heat wraps or applying heat or cold to affected areas. Electrical signals that interrupt pain signals (transcutaneous electrical nerve stimulation, TENS). Weak lasers that reduce pain and swelling (low-level laser therapy). Signals from your body that help you learn to regulate pain (biofeedback). Occupational therapy. This helps you to learn ways to function at home and work with less pain. Recreational therapy. This involves trying new activities or hobbies, such as a physical activity or drawing. Mental health therapy, including: Cognitive behavioral therapy (CBT). This helps you learn coping skills for dealing with pain. Acceptance and commitment therapy (ACT) to change the way you think and react to pain. Relaxation therapies, including muscle relaxation exercises and mindfulness-based stress reduction. Pain management counseling. This may be individual, family, or group counseling.  Receive medical treatments Medical treatments for pain management include: Nerve block injections. These may include a pain blocker and anti-inflammatory medicines. You may have injections: Near the spine to  relieve chronic back or neck pain. Into joints to relieve back or joint pain. Into nerve areas that supply a painful area to relieve body pain. Into muscles (trigger point injections) to relieve some painful muscle conditions. A medical device placed near your spine to help block pain signals and relieve nerve pain or chronic back pain (spinal cord stimulation device). Acupuncture. Follow these instructions at home Medicines Take over-the-counter and prescription medicines only as told by your health care provider. If you are taking pain medicine, ask your health care providers about possible side effects to watch out for. Do not drive or use heavy machinery while taking prescription opioid pain medicine. Lifestyle  Do not use drugs or alcohol to reduce pain. If you drink alcohol, limit how much you have to: 0-1 drink a day for women who are not pregnant. 0-2 drinks a day for men. Know how much alcohol is in a drink. In the [place] one drink equals one 12 oz bottle of beer (355 mL), one 5 oz glass of wine (148 mL), or one 1 oz glass of hard liquor (44 mL). Do not use any products that contain nicotine or tobacco. These products include cigarettes, chewing tobacco, and vaping devices, such as e-cigarettes. If you need help quitting, ask your health care provider. Eat a healthy diet and maintain a healthy weight. Poor diet and excess  weight may make pain worse. Eat foods that are high in fiber. These include fresh fruits and vegetables, whole grains, and beans. Limit foods that are high in fat and processed sugars, such as fried and sweet foods. Exercise regularly. Exercise lowers stress and may help relieve pain. Ask your health care provider what activities and exercises are safe for you. If your health care provider approves, join an exercise class that combines movement and stress reduction. Examples include yoga and tai chi. Get enough sleep. Lack of sleep may make pain worse. Lower stress  as much as possible. Practice stress reduction techniques as told by your therapist. General instructions Work with all your pain management providers to find the treatments that work best for you. You are an important member of your pain management team. There are many things you can do to reduce pain on your own. Consider joining an online or in-person support group for people who have chronic pain. Keep all follow-up visits. This is important. Where to find more information You can find more information about managing pain without opioids from: American Academy of Pain Medicine: painmed.org Institute for Chronic Pain: instituteforchronicpain.org American Chronic Pain Association: theacpa.org Contact a health care provider if: You have side effects from pain medicine. Your pain gets worse or does not get better with treatments or home therapy. You are struggling with anxiety or depression. Summary Many types of pain can be managed without opioids. Chronic pain may respond better to pain management without opioids. Pain is best managed when you and a team of health care providers work together. Pain management without opioids may include non-opioid medicines, medical treatments, physical therapy, mental health therapy, and lifestyle changes. Tell your health care providers if your pain gets worse or is not being managed well enough. This information is not intended to replace advice given to you by your health care provider. Make sure you discuss any questions you have with your health care provider. Document Revised: 10/11/2020 Document Reviewed: 10/11/2020  Elsevier Patient Education  2024 [employer].

## 2024-01-27 ENCOUNTER — Telehealth: Payer: Self-pay

## 2024-01-27 NOTE — Telephone Encounter (Signed)
 Spoke with pt's wife to let her know that we have received pt's patient assistance medication in the office. Wife stated that she would be by tomorrow to pick up.    Tresiba : 6 boxes

## 2024-01-28 ENCOUNTER — Other Ambulatory Visit: Payer: Self-pay

## 2024-01-28 MED ORDER — WARFARIN SODIUM 5 MG PO TABS: 5.0000 mg | ORAL_TABLET | Freq: Every day | ORAL | 0 refills | Status: DC

## 2024-01-28 NOTE — Telephone Encounter (Signed)
 Refilled: 10/18/2023 Last OV: 10/03/2023 Next OV: not scheduled

## 2024-01-29 LAB — POCT INR

## 2024-01-29 NOTE — Telephone Encounter (Signed)
 Dr. Marylynn is out of the office until Tuesday can you advise.

## 2024-01-30 ENCOUNTER — Encounter: Payer: Self-pay | Admitting: Internal Medicine

## 2024-01-30 ENCOUNTER — Telehealth: Payer: Self-pay | Admitting: Family

## 2024-01-30 NOTE — Telephone Encounter (Signed)
 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 For now ,recommend one day a week coumadin  6mg  and the 6 other days, take 5mg .  recheck INR next Thursday

## 2024-02-01 ENCOUNTER — Ambulatory Visit: Payer: Self-pay | Admitting: Internal Medicine

## 2024-02-06 ENCOUNTER — Encounter: Payer: Self-pay | Admitting: Internal Medicine

## 2024-02-06 DIAGNOSIS — Z5181 Encounter for therapeutic drug level monitoring: Secondary | ICD-10-CM

## 2024-02-09 ENCOUNTER — Telehealth: Payer: Self-pay

## 2024-02-09 ENCOUNTER — Encounter: Payer: Self-pay | Admitting: Internal Medicine

## 2024-02-09 DIAGNOSIS — Z5181 Encounter for therapeutic drug level monitoring: Secondary | ICD-10-CM

## 2024-02-09 NOTE — Assessment & Plan Note (Signed)
 INR  was 1.9 on 5 mg daily dose , tested on  July 26   Please continue 5 mg  on Tues Wed Fri Sat Sund.  And 6 mg on Mon and Thurs

## 2024-02-09 NOTE — Telephone Encounter (Signed)
 Copied from CRM [id number]. Topic: Clinical - Lab/Test Results >> Feb 09, 2024  9:39 AM Porter L wrote: Reason for CRM: MD IRN called spoke to [facility], the INR for 7/26 was 1.9

## 2024-02-09 NOTE — Telephone Encounter (Signed)
 Spoke with pt's wife in regards to INR results and coumadin  dose. Wife gave a verbal understanding.

## 2024-02-15 ENCOUNTER — Other Ambulatory Visit: Payer: Self-pay | Admitting: Internal Medicine

## 2024-02-17 DIAGNOSIS — Z952 Presence of prosthetic heart valve: Secondary | ICD-10-CM | POA: Diagnosis not present

## 2024-02-17 LAB — POCT INR: INR: 2.4 — AB (ref 0.80–1.20)

## 2024-02-18 ENCOUNTER — Encounter: Payer: Self-pay | Admitting: Internal Medicine

## 2024-02-18 ENCOUNTER — Ambulatory Visit (INDEPENDENT_AMBULATORY_CARE_PROVIDER_SITE_OTHER): Admitting: Internal Medicine

## 2024-02-18 VITALS — BP 122/84 | HR 92 | Ht 71.0 in | Wt 306.0 lb

## 2024-02-18 DIAGNOSIS — Z6841 Body Mass Index (BMI) 40.0 and over, adult: Secondary | ICD-10-CM

## 2024-02-18 DIAGNOSIS — I152 Hypertension secondary to endocrine disorders: Secondary | ICD-10-CM | POA: Diagnosis not present

## 2024-02-18 DIAGNOSIS — Z7985 Long-term (current) use of injectable non-insulin antidiabetic drugs: Secondary | ICD-10-CM

## 2024-02-18 DIAGNOSIS — Z7901 Long term (current) use of anticoagulants: Secondary | ICD-10-CM

## 2024-02-18 DIAGNOSIS — E785 Hyperlipidemia, unspecified: Secondary | ICD-10-CM

## 2024-02-18 DIAGNOSIS — E1169 Type 2 diabetes mellitus with other specified complication: Secondary | ICD-10-CM

## 2024-02-18 DIAGNOSIS — E1165 Type 2 diabetes mellitus with hyperglycemia: Secondary | ICD-10-CM | POA: Diagnosis not present

## 2024-02-18 DIAGNOSIS — E669 Obesity, unspecified: Secondary | ICD-10-CM | POA: Diagnosis not present

## 2024-02-18 DIAGNOSIS — F0150 Vascular dementia without behavioral disturbance: Secondary | ICD-10-CM

## 2024-02-18 DIAGNOSIS — Z1331 Encounter for screening for depression: Secondary | ICD-10-CM | POA: Diagnosis not present

## 2024-02-18 DIAGNOSIS — I252 Old myocardial infarction: Secondary | ICD-10-CM

## 2024-02-18 DIAGNOSIS — Z8673 Personal history of transient ischemic attack (TIA), and cerebral infarction without residual deficits: Secondary | ICD-10-CM | POA: Diagnosis not present

## 2024-02-18 DIAGNOSIS — Z5181 Encounter for therapeutic drug level monitoring: Secondary | ICD-10-CM

## 2024-02-18 DIAGNOSIS — I76 Septic arterial embolism: Secondary | ICD-10-CM | POA: Diagnosis not present

## 2024-02-18 DIAGNOSIS — G8929 Other chronic pain: Secondary | ICD-10-CM | POA: Diagnosis not present

## 2024-02-18 DIAGNOSIS — E1159 Type 2 diabetes mellitus with other circulatory complications: Secondary | ICD-10-CM | POA: Diagnosis not present

## 2024-02-18 DIAGNOSIS — G4720 Circadian rhythm sleep disorder, unspecified type: Secondary | ICD-10-CM | POA: Diagnosis not present

## 2024-02-18 LAB — COMPREHENSIVE METABOLIC PANEL WITH GFR
ALT: 38 U/L (ref 0–53)
AST: 18 U/L (ref 0–37)
Albumin: 4.4 g/dL (ref 3.5–5.2)
Alkaline Phosphatase: 101 U/L (ref 39–117)
BUN: 13 mg/dL (ref 6–23)
CO2: 25 meq/L (ref 19–32)
Calcium: 9.7 mg/dL (ref 8.4–10.5)
Chloride: 100 meq/L (ref 96–112)
Creatinine, Ser: 0.92 mg/dL (ref 0.40–1.50)
GFR: 90.77 mL/min (ref >60.00)
Glucose, Bld: 100 mg/dL — ABNORMAL HIGH (ref 70–99)
Potassium: 4.2 meq/L (ref 3.5–5.1)
Sodium: 135 meq/L (ref 135–145)
Total Bilirubin: 0.5 mg/dL (ref 0.2–1.2)
Total Protein: 7.4 g/dL (ref 6.0–8.3)

## 2024-02-18 LAB — LIPID PANEL
Cholesterol: 163 mg/dL (ref 0–200)
HDL: 31.9 mg/dL — ABNORMAL LOW (ref >39.00)
LDL Cholesterol: 79 mg/dL (ref 0–99)
NonHDL: 131.12
Total CHOL/HDL Ratio: 5
Triglycerides: 261 mg/dL — ABNORMAL HIGH (ref 0.0–149.0)
VLDL: 52.2 mg/dL — ABNORMAL HIGH (ref 0.0–40.0)

## 2024-02-18 LAB — MICROALBUMIN / CREATININE URINE RATIO
Creatinine,U: 201.6 mg/dL
Microalb Creat Ratio: 168.8 mg/g — ABNORMAL HIGH (ref 0.0–30.0)
Microalb, Ur: 34 mg/dL — ABNORMAL HIGH (ref 0.0–1.9)

## 2024-02-18 LAB — HEMOGLOBIN A1C: Hgb A1c MFr Bld: 9.1 % — ABNORMAL HIGH (ref 4.6–6.5)

## 2024-02-18 LAB — LDL CHOLESTEROL, DIRECT: Direct LDL: 83 mg/dL

## 2024-02-18 MED ORDER — TIRZEPATIDE 10 MG/0.5ML ~~LOC~~ SOAJ: 10.0000 mg | SUBCUTANEOUS | 0 refills | Status: DC

## 2024-02-18 MED ORDER — FREESTYLE LIBRE 2 READER DEVI: 2 refills | Status: DC

## 2024-02-18 NOTE — Progress Notes (Unsigned)
 Subjective:  Patient ID: Connor Ross, male    DOB: Apr 03, 1966  Age: 58 y.o. MRN: [id number]  CC: The primary encounter diagnosis was Obesity, diabetes, and hypertension syndrome (HCC). Diagnoses of Hyperlipidemia with target LDL less than 70, Anticoagulation goal of INR 2.5 to 3.5, History of non-ST elevation myocardial infarction (NSTEMI), Long-term current use of injectable noninsulin antidiabetic medication, Type 2 diabetes mellitus with hyperlipidemia (HCC), Uncontrolled type 2 diabetes mellitus with hyperglycemia (HCC), Morbid obesity with BMI of 45.0-49.9, adult (HCC), and Vascular dementia without behavioral disturbance (HCC) were also pertinent to this visit.   HPI Connor Ross presents for  Chief Complaint  Patient presents with   Medical Management of Chronic Issues    1) type 2 DM;  has been staying up late and sleeping until 2 pm.  Drinking iced tea and gatorade instead of water . No post prandial sugars to report   using preprandial dose  of novolog   twice daily   40 units in the am   45 units at night,  and 70 units of tresiba  twice daily  .   2) chronic left hip pain; debilitating . Seeing [facility] .  Surgery postponed until he loses > 25 lbs .     3) mitral valve replacement :  on chronic  anticoagulation INR 2.4 on ( 6 mg x  5 mg x 5) regimen  4) obesity: weight hs not changed on max dose ozempic . Can't exercise due to orthopedic issues and CAD.  Discussed change to mounjaro   5) CAD:  h/o  NSTEMI , s/p  RCA stent.  30% stenosis in RCA and LAD EF 55% April 2025 . Taking Crestor  for goal LDL 55, metoprolol  12.5 mg bid, and spironolatone 25 mg daily. no longer taking jardiance  for unclear reasons.   6) memory impairment:  since February , short term memory issues.  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 20 tablet 0   ASPIRIN  LOW DOSE 81 MG EC tablet TAKE 1 TABLET BY MOUTH  DAILY (SWALLOW WHOLE) 90 tablet 1   blood glucose meter kit and supplies Dispense based on patient and insurance preference. Use up to four times daily as directed. (FOR ICD-10 E10.9, E11.9). Use to check blood sugar three times daily 1 each 0   clobetasol cream (TEMOVATE) 0.05 % Apply 1 Application topically 2 (two) times daily.     Continuous Blood Gluc Sensor (FREESTYLE LIBRE 2 SENSOR) MISC Use to check sugar at least 4 times daily 1 each 2   fluticasone  (FLONASE ) 50 MCG/ACT nasal spray PLACE 2 SPRAYS INTO BOTH NOSTRILS DAILY. AT NIGHT AFTER NASAL SALINE 16 g 2   furosemide  (LASIX ) 20 MG tablet Take 20 mg by mouth every other day.     gabapentin  (NEURONTIN ) 100 MG capsule TAKE 2 CAPSULES THREE TIMES DAILY 540 capsule 3   insulin  aspart (NOVOLOG  FLEXPEN) 100 UNIT/ML FlexPen Inject up to 25 units with breakfast, 25 units with lunch, and 30 units with supper 72 mL 2   insulin  degludec (TRESIBA  FLEXTOUCH) 200 UNIT/ML FlexTouch Pen Inject 88 Units into the skin daily. 48 mL 2   Insulin  Pen Needle 32G X 6 MM MISC Use  daily with insulin  pen 50 each 0   ketoconazole (NIZORAL) 2 % cream SMARTSIG:1 Application Topical 1 to 2 Times Daily (Patient taking differently: daily as needed.)     Lancets (FREESTYLE) lancets 1 each by Other route 2 (two) times daily. DX 250.02 100 each 12   metoprolol  tartrate (LOPRESSOR ) 25 MG tablet Take 12.5 mg by mouth 2 (two) times daily.     ONETOUCH VERIO test strip USE TO CHECK BLOOD SUGAR 3  TIMES DAILY 300 strip 3   potassium chloride  SA (KLOR-CON  M) 20 MEQ tablet Take 2 tablets (40 mEq total) by mouth daily. 90 tablet 0   rosuvastatin  (CRESTOR ) 40 MG tablet Take 1 tablet (40 mg total) by mouth every evening for 360 doses. 90 tablet 3   spironolactone (ALDACTONE) 25  MG tablet Take 1 tablet by mouth daily.     tamsulosin  (FLOMAX ) 0.4 MG CAPS capsule TAKE 1 CAPSULE EVERY DAY 90 capsule 3   traMADol  (ULTRAM ) 50 MG tablet Take 1 tablet (50 mg total) by mouth every 6 (six) hours as needed. 120 tablet 2   triamcinolone  (KENALOG) 0.025 % cream Apply topically.     Vitamin D , Ergocalciferol , (DRISDOL ) 1.25 MG (50000 UNIT) CAPS capsule Take 50,000 Units by mouth once a week.     warfarin (COUMADIN ) 5 MG tablet Take 1 tablet (5 mg total) by mouth daily. Alternating with 6 mg daily 90 tablet 0   warfarin (COUMADIN ) 6 MG tablet Take 1 tablet (6 mg total) by mouth daily. Alternating with 5 mg daily 30 tablet 2   cefTRIAXone  2 g in sodium chloride  0.9 % 100 mL Inject 2 g into the vein every 12 (twelve) hours.     Semaglutide , 2 MG/DOSE, (OZEMPIC , 2 MG/DOSE,) 8 MG/3ML SOPN Inject 2 mg into the skin once a week. 8 mL 2   senna-docusate (SENOKOT-S) 8.6-50 MG tablet Take 2 tablets by mouth as needed. (Patient not taking: Reported on 02/18/2024)     empagliflozin  (JARDIANCE ) 25 MG TABS tablet Take 1 tablet (25 mg total) by mouth daily. (Patient not taking: Reported on 02/18/2024) 90 tablet 3   metoprolol  tartrate (LOPRESSOR ) 50 MG tablet TAKE 1 TABLET TWICE DAILY (Patient not taking: Reported on 01/26/2024) 180 tablet 3   polyethylene glycol (MIRALAX / GLYCOLAX) 17 g packet Take 17 g by mouth daily as needed. (Patient not taking: Reported on 02/18/2024)     torsemide  (DEMADEX ) 20 MG tablet Take 2 tablets (40 mg total) by mouth 2 (two) times daily. (Patient not taking: Reported on 02/18/2024) 180 tablet 0   No facility-administered medications prior to visit.    Review of Systems;  Patient denies headache, fevers, malaise, unintentional weight loss, skin rash, eye pain, sinus congestion and sinus pain, sore throat, dysphagia,  hemoptysis , cough, dyspnea, wheezing, chest pain, palpitations, orthopnea, edema, abdominal pain, nausea, melena, diarrhea, constipation, flank pain, dysuria,  hematuria, urinary  Frequency, nocturia, numbness, tingling, seizures,  Focal weakness, Loss of consciousness,  Tremor, insomnia, depression, anxiety, and suicidal ideation.      Objective:  BP 122/84   Pulse 92   Ht 5' 11 (1.803 m)   Wt (!) 306 lb (138.8 kg)   SpO2 98%   BMI 42.68 kg/m   BP Readings from Last 3 Encounters:  02/18/24 122/84  10/03/23 103/60  09/24/23 (!) 102/56    Wt Readings from Last 3 Encounters:  02/18/24 (!) 306 lb (138.8 kg)  01/26/24 300 lb (136.1 kg)  10/03/23 282 lb (127.9 kg)  Physical Exam Vitals reviewed.  Constitutional:      General: He is not in acute distress.    Appearance: Normal appearance. He is obese. He is not ill-appearing, toxic-appearing or diaphoretic.  HENT:     Head: Normocephalic.  Eyes:     General: No scleral icterus.       Right eye: No discharge.        Left eye: No discharge.     Conjunctiva/sclera: Conjunctivae normal.  Cardiovascular:     Rate and Rhythm: Normal rate and regular rhythm.     Heart sounds: Normal heart sounds.  Pulmonary:     Effort: Pulmonary effort is normal. No respiratory distress.     Breath sounds: Normal breath sounds.  Musculoskeletal:        General: Normal range of motion.     Cervical back: Normal range of motion.  Skin:    General: Skin is warm and dry.  Neurological:     General: No focal deficit present.     Mental Status: He is alert and oriented to person, place, and time. Mental status is at baseline.  Psychiatric:        Mood and Affect: Mood normal.        Behavior: Behavior normal.        Thought Content: Thought content normal.        Judgment: Judgment normal.     Lab Results  Component Value Date   HGBA1C 9.1 (H) 02/18/2024   HGBA1C 7.9 (A) 09/24/2023   HGBA1C 5.9 07/04/2023    Lab Results  Component Value Date   CREATININE 0.92 02/18/2024   CREATININE 1.02 10/10/2023   CREATININE 0.92 09/24/2023    Lab Results  Component Value Date   WBC 11.2 (H)  09/02/2023   HGB 13.4 09/02/2023   HCT 39.5 09/02/2023   PLT 300 09/02/2023   GLUCOSE 100 (H) 02/18/2024   CHOL 163 02/18/2024   TRIG 261.0 (H) 02/18/2024   HDL 31.90 (L) 02/18/2024   LDLDIRECT 83.0 02/18/2024   LDLCALC 79 02/18/2024   ALT 38 02/18/2024   AST 18 02/18/2024   NA 135 02/18/2024   K 4.2 02/18/2024   CL 100 02/18/2024   CREATININE 0.92 02/18/2024   BUN 13 02/18/2024   CO2 25 02/18/2024   TSH 2.29 03/27/2022   PSA 0.47 03/27/2022   INR 2.40 (A) 02/17/2024   HGBA1C 9.1 (H) 02/18/2024   MICROALBUR 34.0 (H) 02/18/2024    ECHO TEE Result Date: 09/02/2023    TRANSESOPHOGEAL ECHO REPORT   Patient Name:   LAVONTAE CORNIA [facility] Date of Exam: 09/02/2023 Medical Rec #:  [id number]             Height:       71.0 in Accession #:    [id number]            Weight:       290.1 lb Date of Birth:  06/06/1966             BSA:          2.470 m Patient Age:    57 years              BP:           109/67 mmHg Patient Gender: M                     HR:           89 bpm. Exam  Location:  [facility] Procedure: Transesophageal Echo, Color Doppler, Cardiac Doppler and 3D Echo            (Both Spectral and Color Flow Doppler were utilized during            procedure). Indications:     Bacteremia  History:         Patient has prior history of Echocardiogram examinations, most                  recent 06/25/2024. Risk Factors:Sleep Apnea and Hypertension.  Sonographer:     Christopher Furnace Sonographer#2:   Naomie Reef Referring Phys:  CELESTER ALTER A KHAN Diagnosing Phys: Keller Paterson PROCEDURE: After discussion of the risks and benefits of a TEE, an informed consent was obtained from the patient. The transesophogeal probe was passed without difficulty through the esophogus of the patient. Imaged were obtained with the patient in a left lateral decubitus position. Sedation performed by different physician. The patient was monitored while under deep sedation. Image quality was excellent. The patient's vital signs;  including heart rate, blood pressure, and oxygen  saturation; remained  stable throughout the procedure. The patient developed no complications during the procedure.  IMPRESSIONS  1. Left ventricular ejection fraction, by estimation, is 60 to 65%. The left ventricle has normal function.  2. Right ventricular systolic function is normal. The right ventricular size is normal.  3. No left atrial/left atrial appendage thrombus was detected.  4. Large (2.2 cm X 0.7 cm) mobile echogenic structure noted on mitral valve consistent with vegetation. Trivial mitral regurgitation.  5. The aortic valve is tricuspid. Aortic valve regurgitation is not visualized.  6. 3D performed of the mitral valve. FINDINGS  Left Ventricle: Left ventricular ejection fraction, by estimation, is 60 to 65%. The left ventricle has normal function. The left ventricular internal cavity size was normal in size. Right Ventricle: The right ventricular size is normal. No increase in right ventricular wall thickness. Right ventricular systolic function is normal. Left Atrium: Left atrial size was normal in size. No left atrial/left atrial appendage thrombus was detected. Right Atrium: Right atrial size was normal in size. Pericardium: There is no evidence of pericardial effusion. Mitral Valve: Large (2.2 cm X 0.7 cm) mobile echogenic structure noted on mitral valve consistent with vegetation. Trivial mitral regurgitation. Tricuspid Valve: The tricuspid valve is normal in structure. Tricuspid valve regurgitation is trivial. Aortic Valve: The aortic valve is tricuspid. Aortic valve regurgitation is not visualized. Pulmonic Valve: The pulmonic valve was normal in structure. Pulmonic valve regurgitation is not visualized. Aorta: The aortic root is normal in size and structure. IAS/Shunts: No atrial level shunt detected by color flow Doppler. Additional Comments: 3D imaging was not performed. Keller Paterson Electronically signed by Keller Paterson Signature  Date/Time: 09/02/2023/5:33:48 PM    Final     Assessment & Plan:  .Obesity, diabetes, and hypertension syndrome (HCC) -     Microalbumin / creatinine urine ratio -     Hemoglobin A1c -     Comprehensive metabolic panel with GFR  Hyperlipidemia with target LDL less than 70 -     Lipid panel -     LDL cholesterol, direct  Anticoagulation goal of INR 2.5 to 3.5 Assessment & Plan: INR  using MDINR is therapeutic on warfarin regimen  5 mg  on Tues Wed Fri Sat Sund.  And 6 mg on Mon and Thurs  Lab Results  Component Value Date   INR 2.40 (A) 02/17/2024   INR  2.3 (H) 12/17/2023   INR 2.4 (H) 12/09/2023      History of non-ST elevation myocardial infarction (NSTEMI) Assessment & Plan: S/p quadruple stenting, with PTCA/ overlapping stents of LAD on Jun 25 2022  , followed by PTCA/stent of RCA on Dec 13.   He completed a course of Brilinta  and has not had recurrence of chest pain.   LDL is <70 on  rosuvastatin  40 mg, but needs to be 55 or less.  Will add zetia   Lab Results  Component Value Date   CHOL 163 02/18/2024   HDL 31.90 (L) 02/18/2024   LDLCALC 79 02/18/2024   LDLDIRECT 83.0 02/18/2024   TRIG 261.0 (H) 02/18/2024   CHOLHDL 5 02/18/2024      Long-term current use of injectable noninsulin antidiabetic medication Assessment & Plan: He has not lost weight in several months on maximal dose of Ozempic   . Discussed changing to Mounjaro  if able financially.  Rx  for 10 mg dose (comparable to max dose ozempic ) sent to  pharmacy   Type 2 diabetes mellitus with hyperlipidemia (HCC) Assessment & Plan: Loss of control noted due to lack of regularly scheduled meals and dietary noncompliance.  Freestyle Libre monitor strongly advised to facilitate more frequent monitoring ,  especially pre and post meal checks .  Adding zetia  for LDL above goal.  Continue ARB and will resume jardiance    Lab Results  Component Value Date   HGBA1C 9.1 (H) 02/18/2024   Lab Results  Component Value  Date   CHOL 163 02/18/2024   HDL 31.90 (L) 02/18/2024   LDLCALC 79 02/18/2024   LDLDIRECT 83.0 02/18/2024   TRIG 261.0 (H) 02/18/2024   CHOLHDL 5 02/18/2024   Lab Results  Component Value Date   MICROALBUR 34.0 (H) 02/18/2024     Lab Results  Component Value Date   NA 135 02/18/2024   K 4.2 02/18/2024   CL 100 02/18/2024   CO2 25 02/18/2024   Lab Results  Component Value Date   CREATININE 0.92 02/18/2024      Uncontrolled type 2 diabetes mellitus with hyperglycemia (HCC) -     Empagliflozin ; Take 1 tablet (25 mg total) by mouth daily.  Dispense: 90 tablet; Refill: 3  Morbid obesity with BMI of 45.0-49.9, adult (HCC) Assessment & Plan: Changing GLP 1 agonist therapy to Mounjaro  as he has gained weight on maximal dose of Ozempic    Vascular dementia without behavioral disturbance [facility]) Assessment & Plan: Multi-infarct dementia confirmed during recent [facility] evaluation.  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 Timing of symptom onset correlates with infarcts. Slight memory improvement since hospitalization, but still impaired. Cholinesterase inhibitors generally not used for vascular dementias, and would avoid due to cardiac history anyway. Chronic pain and poor sleep may worsen memory. - Refer to stroke clinic for evaluation and management of vascular brain issues. - Discuss occupational therapy for cognitive rehabilitation with primary care physician. - Re-evaluate in one year to assess memory and cognitive function.    Other orders -     Tirzepatide ; Inject 10 mg into the skin once a week.  Dispense: 6 mL; Refill: 0 -     FreeStyle Libre 2 Reader; Use with sensor to check BS  Dispense: 2 each; Refill: 2 -     Ezetimibe ; Take 1 tablet (10 mg total) by mouth daily.  Dispense: 90 tablet; Refill: 3    I personally spent a total of  40 minutes in the care of the patient today  including preparing to see the patient, getting/reviewing separately obtained history, performing a medically appropriate exam/evaluation, counseling and educating, placing orders, documenting clinical information in the EHR, and communicating results.   Follow-up: Return in about 3 months (around 05/20/2024).   Verneita LITTIE Kettering, MD

## 2024-02-18 NOTE — Patient Instructions (Addendum)
 You may  Increase the  tramadol   to 100 mg as needed but the maximal dose of  100 mg every 6 hours   I would like to change your Ozempic  to Mounjaro  to help you get weight off,  I have sent it to mail order.    Please start using the [facility] ASAP and submit the reader to the office in 2 weeks so we can download your data  Try to TURN YOUR CLOCK AROUND and start sleeping and eating on a normal schedule

## 2024-02-19 DIAGNOSIS — E1165 Type 2 diabetes mellitus with hyperglycemia: Secondary | ICD-10-CM | POA: Diagnosis not present

## 2024-02-20 ENCOUNTER — Ambulatory Visit (INDEPENDENT_AMBULATORY_CARE_PROVIDER_SITE_OTHER): Payer: Self-pay | Admitting: Internal Medicine

## 2024-02-20 ENCOUNTER — Encounter: Payer: Self-pay | Admitting: Internal Medicine

## 2024-02-20 DIAGNOSIS — Z5181 Encounter for therapeutic drug level monitoring: Secondary | ICD-10-CM

## 2024-02-20 DIAGNOSIS — Z7901 Long term (current) use of anticoagulants: Secondary | ICD-10-CM | POA: Diagnosis not present

## 2024-02-20 DIAGNOSIS — F0150 Vascular dementia without behavioral disturbance: Secondary | ICD-10-CM | POA: Insufficient documentation

## 2024-02-20 MED ORDER — EZETIMIBE 10 MG PO TABS: 10.0000 mg | ORAL_TABLET | Freq: Every day | ORAL | 3 refills | Status: AC

## 2024-02-20 MED ORDER — EMPAGLIFLOZIN 25 MG PO TABS: 25.0000 mg | ORAL_TABLET | Freq: Every day | ORAL | 3 refills | Status: AC

## 2024-02-20 NOTE — Assessment & Plan Note (Signed)
 S/p quadruple stenting, with PTCA/ overlapping stents of LAD on Jun 25 2022  , followed by PTCA/stent of RCA on Dec 13.   He completed a course of Brilinta  and has not had recurrence of chest pain.   LDL is <70 on  rosuvastatin  40 mg, but needs to be 55 or less.  Will add zetia   Lab Results  Component Value Date   CHOL 163 02/18/2024   HDL 31.90 (L) 02/18/2024   LDLCALC 79 02/18/2024   LDLDIRECT 83.0 02/18/2024   TRIG 261.0 (H) 02/18/2024   CHOLHDL 5 02/18/2024

## 2024-02-20 NOTE — Telephone Encounter (Signed)
 Stay on same coumadin  dose and recheck Monday as planned. Show to Dr Marylynn.

## 2024-02-20 NOTE — Assessment & Plan Note (Signed)
 Changing GLP 1 agonist therapy to Mounjaro  as he has gained weight on maximal dose of Ozempic 

## 2024-02-20 NOTE — Assessment & Plan Note (Signed)
 Loss of control noted due to lack of regularly scheduled meals and dietary noncompliance.  Freestyle Libre monitor strongly advised to facilitate more frequent monitoring ,  especially pre and post meal checks .  Adding zetia  for LDL above goal.  Continue ARB and will resume jardiance    Lab Results  Component Value Date   HGBA1C 9.1 (H) 02/18/2024   Lab Results  Component Value Date   CHOL 163 02/18/2024   HDL 31.90 (L) 02/18/2024   LDLCALC 79 02/18/2024   LDLDIRECT 83.0 02/18/2024   TRIG 261.0 (H) 02/18/2024   CHOLHDL 5 02/18/2024   Lab Results  Component Value Date   MICROALBUR 34.0 (H) 02/18/2024     Lab Results  Component Value Date   NA 135 02/18/2024   K 4.2 02/18/2024   CL 100 02/18/2024   CO2 25 02/18/2024   Lab Results  Component Value Date   CREATININE 0.92 02/18/2024

## 2024-02-20 NOTE — Assessment & Plan Note (Addendum)
 INR  using MDINR is therapeutic on warfarin regimen  5 mg  on Tues Wed Fri Sat Sund.  And 6 mg on Mon and Thurs  Lab Results  Component Value Date   INR 2.40 (A) 02/17/2024   INR 2.3 (H) 12/17/2023   INR 2.4 (H) 12/09/2023

## 2024-02-20 NOTE — Assessment & Plan Note (Signed)
 Multi-infarct dementia confirmed during recent [facility] evaluation.  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 Timing of symptom onset correlates with infarcts. Slight memory improvement since hospitalization, but still impaired. Cholinesterase inhibitors generally not used for vascular dementias, and would avoid due to cardiac history anyway. Chronic pain and poor sleep may worsen memory. - Refer to stroke clinic for evaluation and management of vascular brain issues. - Discuss occupational therapy for cognitive rehabilitation with primary care physician. - Re-evaluate in one year to assess memory and cognitive function.

## 2024-02-20 NOTE — Assessment & Plan Note (Signed)
 He has not lost weight in several months on maximal dose of Ozempic   . Discussed changing to Mounjaro  if able financially.  Rx  for 10 mg dose (comparable to max dose ozempic ) sent to  pharmacy

## 2024-02-26 ENCOUNTER — Ambulatory Visit: Payer: Self-pay

## 2024-02-26 NOTE — Telephone Encounter (Signed)
 Spoke with pt and pt's wife stated he is doing 5mg  for 5 days and 6 on two dayx.

## 2024-02-26 NOTE — Telephone Encounter (Signed)
 FYI Only or Action Required?: Action required by provider: update on patient condition.  Patient was last seen in primary care on 02/18/2024 by Marylynn Verneita CROME, MD.  Called Nurse Triage reporting Dementia.  Symptoms began yesterday.  Interventions attempted: Nothing.  Symptoms are: gradually worsening.  Triage Disposition: Go to ED Now (Notify PCP)  Patient/caregiver understands and will follow disposition?: No, wishes to speak with PCP-  FYI- Wife Elray [phone]) says that Refusing all evening and morning medications including warfarin, BP medication, and insulin . She says that he will notgo to the hospital and she is concerned about an infection again  Copied from CRM #[id number]. Topic: Clinical - Red Word Triage >> Feb 26, 2024  9:25 AM Laymon HERO wrote: Red Word that prompted transfer to Nurse Triage: Wife calling in- patient became very angry yesterday and refuses to take his medications. Reason for Disposition  Very strange or paranoid behavior  Answer Assessment - Initial Assessment Questions 1. LEVEL OF CONSCIOUSNESS: How are they (the patient) acting right now? (e.g., alert-oriented, confused, lethargic, stuporous, comatose)     Confused- refusing to take meds and talk to family  2. ONSET: When did the confusion start?  (e.g., minutes, hours, days)     Yesterday  3. PATTERN: Does this come and go, or has it been constant since it started?  Is it present now?     Present now  4. ALCOHOL or DRUGS: Have they been drinking alcohol or taking any drugs?      No  5. NARCOTIC MEDICINES: Have they been receiving any narcotic medications? (e.g., morphine , Vicodin)     No  6. CAUSE: What do you think is causing the confusion?      Unsure of cause  7. OTHER SYMPTOMS: Are there any other symptoms? (e.g., difficulty breathing, fever, headache, weakness)     Hip Pain  Protocols used: Confusion - Delirium-A-AH

## 2024-02-26 NOTE — Telephone Encounter (Signed)
 Spoke with pt's wife to let her know what Dr. Marylynn has advised. She stated that she would let pt know but she doubted that he would go. I explained the importance of the need to be evaluated and she asked me to let the pt know as well. She placed me on speaker phone with pt and I read the message to him from Dr. Marylynn. Pt started getting mad saying that he doesn't have any symptoms and his wife is over reacting going behind his back. I let pt know that we can not make him go to the ED but that it is highly recommended with the symptoms that his wife gave us . Wife asked me to say the symptoms that I was talking about so I listed the confusion, anger, paranoia and refusing to take medications. Pt then started getting anger with his wife and yelling at her. Wife walked to another room to finish talking to me and she stated that if he gets any angrier she is going to call 911. Pt's wife was upset stating that pt is not angry with one else but her and she isn't sure why because all she does is take care of him. She did state that pt had finally taken his morning meds except for his insulin  around 2 pm. I advised her to please give us  a call if she needed anything and if his symptoms worsened to please call 911. I also let her know that I would discuss this with Dr. Marylynn. Spoke to Dr. Marylynn and she advised that pt's wife needed to go ahead and call 911 so I called the wife back to let her know. She said okay.

## 2024-02-26 NOTE — Telephone Encounter (Signed)
 Pt's wife called in stating that pt is refusing to take any of his medications since yesterday evening and his behavior has changed. They refused ED. Pt's wife is concerned of an infection again.

## 2024-02-26 NOTE — Assessment & Plan Note (Addendum)
 Current INR is 2.9 INR  on August 12 using MDINR on warfarin regimen  5 mg  on Tues Wed Fri Sat Sund.  And 6 mg on Mon and Thurs  Lab Results  Component Value Date   INR 2.40 (A) 02/17/2024   INR 2.3 (H) 12/17/2023   INR 2.4 (H) 12/09/2023

## 2024-02-27 NOTE — Telephone Encounter (Signed)
 Spoke with pt's wife and asked her if pt would like schedule an appointment this evening with Dr. Marylynn to talk with her about his symptoms. Pt stated that he is not interested.

## 2024-02-27 NOTE — Telephone Encounter (Unsigned)
 Copied from CRM [id number]. Topic: General - Other >> Feb 27, 2024  9:07 AM Burnard DEL wrote: Reason for CRM: Wife called in to let provider now that even after 911 was called to the home,they could not force him to go to the hospital.She stated that he is still not eating,taking his medications,or drinking any fluids. Please contact wife Darice for further information at [phone]

## 2024-03-02 ENCOUNTER — Encounter: Payer: Self-pay | Admitting: Internal Medicine

## 2024-03-02 ENCOUNTER — Telehealth: Payer: Self-pay

## 2024-03-02 NOTE — Telephone Encounter (Signed)
 Copied from CRM #[id number]. Topic: Clinical - Lab/Test Results >> Mar 02, 2024  9:10 AM Viola FALCON wrote: Reason for RMF:Rympdupwz with [facility] called with inr results - today it's 1.0. Call back number if needed, [phone]

## 2024-03-02 NOTE — Telephone Encounter (Signed)
 Spoke with pt and informed him and his wife of his INR result and that Dr. Marylynn would like for him to resume taking the Coumadin  and the importance of taking it. Pt gave a verbal understanding. Pt's wife stated that pt did take his Tresiba  and Lantus today and has agreed to start taking his medications again.

## 2024-03-02 NOTE — Telephone Encounter (Signed)
 Spoke with pt and pt's wife to find out if pt has started back on taking his medications. Pt's wife stated that pt is still not taking his medications. I advised pt of the importance of the coumadin . I let him know that I am going to send Dr. Eddy the result of his INR for today and that she is going to advise that he start taking the coumadin  and a new regimen. Pt stated okay whatever.

## 2024-03-03 ENCOUNTER — Other Ambulatory Visit (HOSPITAL_COMMUNITY): Payer: Self-pay

## 2024-03-03 ENCOUNTER — Telehealth: Payer: Self-pay

## 2024-03-03 NOTE — Telephone Encounter (Signed)
 The Freestyle Libre 14 day, 2 and 3 are being discontinued in September 2025. The new version is 2 plus and 3 plus and are available. We are highly encouraging providers to go ahead and make the switch now. That will prevent us  from doing the prior auth on the old version now and then having to do a prior auth on the new version again in a few months. Thanks!

## 2024-03-04 MED ORDER — FREESTYLE LIBRE 3 READER DEVI: 0 refills | Status: AC

## 2024-03-04 MED ORDER — FREESTYLE LIBRE 3 PLUS SENSOR MISC: 5 refills | Status: AC

## 2024-03-04 NOTE — Addendum Note (Signed)
 Addended by: HARRIETTE RAISIN on: 03/04/2024 09:02 AM   Modules accepted: Orders

## 2024-03-04 NOTE — Telephone Encounter (Signed)
 Reader and sensor has been sent in.

## 2024-03-05 ENCOUNTER — Other Ambulatory Visit (HOSPITAL_COMMUNITY): Payer: Self-pay

## 2024-03-05 ENCOUNTER — Telehealth: Payer: Self-pay

## 2024-03-05 NOTE — Telephone Encounter (Signed)
 Pharmacy Patient Advocate Encounter   Received notification from Pt Calls Messages that prior authorization for FreeStyle Libre 3 Plus Sensor  is required/requested.   Insurance verification completed.   The patient is insured through [employer] .   Per test claim: PA required; PA submitted to above mentioned insurance via Latent Key/confirmation #/EOC [id number] Status is pending

## 2024-03-05 NOTE — Telephone Encounter (Signed)
 Pharmacy Patient Advocate Encounter  Received notification from [employer] that Prior Authorization for FreeStyle Libre 3 Plus Sensor  has been APPROVED from 07/16/23 to 07/14/24. Ran test claim, Copay is $29.13. This test claim was processed through [facility]- copay amounts may vary at other pharmacies due to pharmacy/plan contracts, or as the patient moves through the different stages of their insurance plan.   PA #/Case ID/Reference #: [id number]

## 2024-03-05 NOTE — Telephone Encounter (Signed)
 PA request Freestyle Libre 3 Plus has been Submitted. New Encounter has been or will be created for follow up. For additional info see Pharmacy Prior Auth telephone encounter from 03/05/24.

## 2024-03-05 NOTE — Telephone Encounter (Signed)
 Patient is aware that the [facility] 3 sensor PA was approved and that the sensor was called in at [facility] per Patient request.

## 2024-03-09 ENCOUNTER — Encounter: Payer: Self-pay | Admitting: Internal Medicine

## 2024-03-09 ENCOUNTER — Telehealth: Payer: Self-pay | Admitting: *Deleted

## 2024-03-09 ENCOUNTER — Ambulatory Visit: Payer: Self-pay | Admitting: Internal Medicine

## 2024-03-09 LAB — POCT INR: INR: 1.8 — AB (ref 0.80–1.20)

## 2024-03-09 NOTE — Telephone Encounter (Signed)
 03/09/24 @ 11:51AM  [facility]-1.8 (OUT OF RANGE)

## 2024-03-09 NOTE — Telephone Encounter (Signed)
 Copied from CRM #[id number]. Topic: Clinical - Lab/Test Results >> Mar 09, 2024  2:21 PM Franky GRADE wrote: Reason for CRM: [facility] is calling to report an out of range INR. Best call back number is [phone]. Called [facility] and they asked to send CRM.

## 2024-03-10 NOTE — Telephone Encounter (Signed)
 Duplicate testing notification     Copied from CRM [id number]. Topic: Clinical - Medical Advice >> Mar 10, 2024  9:30 AM Thersia BROCKS wrote: Reason for CRM: [facility] calling regarding patient our of range INR  1.8 DONE ON August 26th   [phone]

## 2024-03-16 DIAGNOSIS — Z952 Presence of prosthetic heart valve: Secondary | ICD-10-CM | POA: Diagnosis not present

## 2024-03-21 DIAGNOSIS — E1165 Type 2 diabetes mellitus with hyperglycemia: Secondary | ICD-10-CM | POA: Diagnosis not present

## 2024-04-11 ENCOUNTER — Other Ambulatory Visit: Payer: Self-pay | Admitting: Internal Medicine

## 2024-04-12 ENCOUNTER — Telehealth: Payer: Self-pay

## 2024-04-12 ENCOUNTER — Encounter: Payer: Self-pay | Admitting: Pharmacist

## 2024-04-12 DIAGNOSIS — Z5181 Encounter for therapeutic drug level monitoring: Secondary | ICD-10-CM

## 2024-04-12 NOTE — Telephone Encounter (Signed)
 Spoke with pt's wife to let her know that we have not received any INR result since 03/16/2024. Wife stated that they have been checking it and sending it in to [facility]. She gave the readings since 03/16/2024.  03/23/2024 - 2.9  taking 6 mg daily 03/30/2024 - 2.8   taking 6 mg daily 04/06/2024 - 3.6   she changed him back to 5 mg on Thursday and Monday and 6 mg all other days  Will check again tomorrow.    She also stated that pt is running low on his Tresiba . I will reach out to [facility] in regards to this. See other telephone encounter.

## 2024-04-12 NOTE — Telephone Encounter (Signed)
 Spoke with the company [facility] and they stated that they have been faxing over the results. I let him know that we have not recevied them and verified that he had the right fax number. They went ahead and faxed over the results to us  to make sure we received them today and we did.

## 2024-04-12 NOTE — Telephone Encounter (Signed)
 Spoke with pt's wife and she stated that pt is running low on his Tresiba . We have not received a shipment here in the office.

## 2024-04-12 NOTE — Telephone Encounter (Signed)
 Checked the refrigerator and they never came by the office to pick up the medication when they were contacted on 01/27/2024. Wife is aware that medication is here and ready for pick up.

## 2024-04-12 NOTE — Progress Notes (Signed)
 Medication was still in the refrigerator from when we called in July to let them know that medication was ready for pick. I spoke with pt's wife to let her know.

## 2024-04-12 NOTE — Progress Notes (Signed)
 Chart Review Reason: Drug information Question - Refills  Summary: Patient notes they are running low on Tresiba .  Last shipment from [facility] delivered to clinic: Tuesday, July 15 at 10:04 A.M x120 day supply 6 boxes Mozella hora Tracking ID [id number]  Next shipment not due until November.   Appears dose change(s) may have led to patient running out sooner.  Refill form to [facility] needed - may take several weeks for new refill.   [facility] Immediate Supply If you are at risk of rationing your insulin  and have an immediate need, you may be eligible for a one-time offer for a free, short-term supply of Novo Nordisk insulin  (30 day supply).  If you are enrolled in a Medicare Part D prescription drug plan Please call [phone] to complete your enrollment.    [facility] Refill form uploaded to [employer] for signature/fax to [facility].  Forwarded to CMA.  Dosing not filled out (88u/d in med list, 70 units twice daily in recent PCP notes)

## 2024-04-12 NOTE — Telephone Encounter (Signed)
 Copied from CRM [id number]. Topic: Clinical - Medical Advice >> Apr 12, 2024  9:21 AM Rea ORN wrote: Reason for CRM: Pt wife Darice called to see if clinic is receiving pts INR results for the past 3 weeks. Darice wants to know what direction they are going with coumadin .   Please call Darice back at [phone]

## 2024-04-12 NOTE — Assessment & Plan Note (Signed)
 INR was 3.6 on 6 mg daily on Sept 23   ,  regimen has bbeen changed to  6 mg alt with 5 mg.  Repeat inr Sept 30

## 2024-04-12 NOTE — Telephone Encounter (Signed)
 Spoke with pt's wife to let her know to continue the current regimen and recheck INR tomorrow. Wife has already contacted [facility] and they stated that they have been faxing the results to us  so they are not sure why we have not received them wife gave me a number to call them so we can try to figure out why we aren't receiving thee faxes.

## 2024-04-13 ENCOUNTER — Encounter: Payer: Self-pay | Admitting: Internal Medicine

## 2024-04-13 ENCOUNTER — Ambulatory Visit: Payer: Self-pay | Admitting: Internal Medicine

## 2024-04-13 DIAGNOSIS — Z952 Presence of prosthetic heart valve: Secondary | ICD-10-CM | POA: Diagnosis not present

## 2024-04-13 NOTE — Telephone Encounter (Signed)
 Patient is currently taking 5 mg Monday/Thurs, 6 mg all other days.

## 2024-04-14 NOTE — Telephone Encounter (Signed)
Viewed by patient .

## 2024-04-16 ENCOUNTER — Telehealth: Payer: Self-pay

## 2024-04-16 NOTE — Telephone Encounter (Signed)
 Pt came in and picked up his pt assistance medication @ 12pm Tresiba 

## 2024-04-20 ENCOUNTER — Encounter: Payer: Self-pay | Admitting: Internal Medicine

## 2024-04-20 ENCOUNTER — Encounter: Payer: Self-pay | Admitting: Pharmacist

## 2024-04-26 ENCOUNTER — Telehealth: Payer: Self-pay

## 2024-04-26 NOTE — Telephone Encounter (Signed)
 Pt notified of Nov[facility]  ready for pick up. 8 boxes Lot: [id number] Exp: 06/13/26

## 2024-04-27 ENCOUNTER — Encounter: Payer: Self-pay | Admitting: Internal Medicine

## 2024-04-28 ENCOUNTER — Encounter: Payer: Self-pay | Admitting: Internal Medicine

## 2024-04-28 LAB — POCT INR: INR: 3.3 — AB (ref 0.80–1.20)

## 2024-04-29 ENCOUNTER — Encounter: Payer: Self-pay | Admitting: Internal Medicine

## 2024-04-30 ENCOUNTER — Ambulatory Visit: Payer: Self-pay | Admitting: Internal Medicine

## 2024-04-30 ENCOUNTER — Telehealth: Payer: Self-pay

## 2024-04-30 NOTE — Telephone Encounter (Signed)
 Spoke with pt's wife to let her knowt hat we have received pt's patient assistance medication. Wife stated that she will come by next week to pick up.   Ozempic  2 mg: 3 boxes Pen needles: 3 boxes

## 2024-04-30 NOTE — Telephone Encounter (Signed)
 Pt's wife came by to pick up medications on 04/30/24 at 12:45pm

## 2024-05-03 DIAGNOSIS — E785 Hyperlipidemia, unspecified: Secondary | ICD-10-CM | POA: Diagnosis not present

## 2024-05-03 DIAGNOSIS — Z794 Long term (current) use of insulin: Secondary | ICD-10-CM | POA: Diagnosis not present

## 2024-05-03 DIAGNOSIS — Z833 Family history of diabetes mellitus: Secondary | ICD-10-CM | POA: Diagnosis not present

## 2024-05-03 DIAGNOSIS — N400 Enlarged prostate without lower urinary tract symptoms: Secondary | ICD-10-CM | POA: Diagnosis not present

## 2024-05-03 DIAGNOSIS — I349 Nonrheumatic mitral valve disorder, unspecified: Secondary | ICD-10-CM | POA: Diagnosis not present

## 2024-05-03 DIAGNOSIS — G4733 Obstructive sleep apnea (adult) (pediatric): Secondary | ICD-10-CM | POA: Diagnosis not present

## 2024-05-03 DIAGNOSIS — Z809 Family history of malignant neoplasm, unspecified: Secondary | ICD-10-CM | POA: Diagnosis not present

## 2024-05-03 DIAGNOSIS — I255 Ischemic cardiomyopathy: Secondary | ICD-10-CM | POA: Diagnosis not present

## 2024-05-03 DIAGNOSIS — M4800 Spinal stenosis, site unspecified: Secondary | ICD-10-CM | POA: Diagnosis not present

## 2024-05-03 DIAGNOSIS — I10 Essential (primary) hypertension: Secondary | ICD-10-CM | POA: Diagnosis not present

## 2024-05-03 DIAGNOSIS — I679 Cerebrovascular disease, unspecified: Secondary | ICD-10-CM | POA: Diagnosis not present

## 2024-05-03 DIAGNOSIS — M1990 Unspecified osteoarthritis, unspecified site: Secondary | ICD-10-CM | POA: Diagnosis not present

## 2024-05-03 DIAGNOSIS — I099 Rheumatic heart disease, unspecified: Secondary | ICD-10-CM | POA: Diagnosis not present

## 2024-05-03 DIAGNOSIS — E1142 Type 2 diabetes mellitus with diabetic polyneuropathy: Secondary | ICD-10-CM | POA: Diagnosis not present

## 2024-05-03 DIAGNOSIS — I700 Atherosclerosis of aorta: Secondary | ICD-10-CM | POA: Diagnosis not present

## 2024-05-03 DIAGNOSIS — I872 Venous insufficiency (chronic) (peripheral): Secondary | ICD-10-CM | POA: Diagnosis not present

## 2024-05-03 DIAGNOSIS — M545 Low back pain, unspecified: Secondary | ICD-10-CM | POA: Diagnosis not present

## 2024-05-03 DIAGNOSIS — I252 Old myocardial infarction: Secondary | ICD-10-CM | POA: Diagnosis not present

## 2024-05-03 DIAGNOSIS — Z6841 Body Mass Index (BMI) 40.0 and over, adult: Secondary | ICD-10-CM | POA: Diagnosis not present

## 2024-05-03 DIAGNOSIS — E1151 Type 2 diabetes mellitus with diabetic peripheral angiopathy without gangrene: Secondary | ICD-10-CM | POA: Diagnosis not present

## 2024-05-03 DIAGNOSIS — I2510 Atherosclerotic heart disease of native coronary artery without angina pectoris: Secondary | ICD-10-CM | POA: Diagnosis not present

## 2024-05-03 DIAGNOSIS — I359 Nonrheumatic aortic valve disorder, unspecified: Secondary | ICD-10-CM | POA: Diagnosis not present

## 2024-05-03 DIAGNOSIS — I38 Endocarditis, valve unspecified: Secondary | ICD-10-CM | POA: Diagnosis not present

## 2024-05-03 DIAGNOSIS — E11620 Type 2 diabetes mellitus with diabetic dermatitis: Secondary | ICD-10-CM | POA: Diagnosis not present

## 2024-05-03 DIAGNOSIS — E11319 Type 2 diabetes mellitus with unspecified diabetic retinopathy without macular edema: Secondary | ICD-10-CM | POA: Diagnosis not present

## 2024-05-03 DIAGNOSIS — Z7901 Long term (current) use of anticoagulants: Secondary | ICD-10-CM | POA: Diagnosis not present

## 2024-05-03 NOTE — Telephone Encounter (Signed)
 Pt's wife karen notified of Tresiba  ready for pick up.  5 boxes 200u/ml Lot: [id number]  Exp: 05/14/26

## 2024-05-05 ENCOUNTER — Encounter: Payer: Self-pay | Admitting: Internal Medicine

## 2024-05-11 DIAGNOSIS — Z952 Presence of prosthetic heart valve: Secondary | ICD-10-CM | POA: Diagnosis not present

## 2024-05-11 LAB — POCT INR

## 2024-05-12 ENCOUNTER — Telehealth: Payer: Self-pay | Admitting: Internal Medicine

## 2024-05-12 LAB — POCT INR: INR: 3.1 — AB (ref 0.80–1.20)

## 2024-05-12 NOTE — Telephone Encounter (Signed)
 I have abstracted.

## 2024-05-12 NOTE — Telephone Encounter (Signed)
 INR report sent to HIM. INR is 3.1

## 2024-05-12 NOTE — Telephone Encounter (Signed)
 Spoke with pt's wife to let her know that pt's INR was therapuetic and to continue his current regimen. Wife gave a verbal understanding. Pt's wife also had other questions about his medications. She stated that the last refill pt received for his Metoprolol  was the 50 mg dose whoever she stated that pt has been on the 25 mg half tablet twice daily since March. She is wanting to know if it was changed for some reason. Pt is still currently taking the 25 mg half tablet twice dialy. She also stated that they received a rx for Zetia  and wasn't sure why. Pt has not started the medication yet. I looked back in the chart at last result note in August and reread the message to pt explaining why the Zetia  was sent in. Pt's wife stated that she will start given the pt the Zetia  tonight. She just needs to know if pt should still be taking the Metoprolol  25 mg half tablet twice daily or the 50 mg tablet.   She also stated that pt's pain has worsened and would like to know what else they can do for it. She stated that they will not do surgery until pt has lost wait but he has been unable to lose weight due to not being able to get around as much because of the pain. I have scheduled pt for a virtual visit on Friday 10/31 at 2:30 pm.

## 2024-05-13 MED ORDER — METOPROLOL TARTRATE 25 MG PO TABS: 12.5000 mg | ORAL_TABLET | Freq: Two times a day (BID) | ORAL | 1 refills | Status: AC

## 2024-05-13 NOTE — Telephone Encounter (Signed)
 Spoke with pt's wife to let her know that the pt should be only taking the 25 mg tablet half tablet twice daily. I let her know that I have called the pharmacy to discontinue the 50 mg and have them refill the 25 mg. Pt gave a verbal understanding.

## 2024-05-13 NOTE — Addendum Note (Signed)
 Addended by: HARRIETTE RAISIN on: 05/13/2024 05:07 PM   Modules accepted: Orders

## 2024-05-14 ENCOUNTER — Telehealth (INDEPENDENT_AMBULATORY_CARE_PROVIDER_SITE_OTHER): Admitting: Internal Medicine

## 2024-05-14 ENCOUNTER — Encounter: Payer: Self-pay | Admitting: Internal Medicine

## 2024-05-14 VITALS — BP 131/74 | HR 80 | Ht 71.0 in | Wt 338.0 lb

## 2024-05-14 DIAGNOSIS — M5416 Radiculopathy, lumbar region: Secondary | ICD-10-CM

## 2024-05-14 DIAGNOSIS — I10 Essential (primary) hypertension: Secondary | ICD-10-CM

## 2024-05-14 DIAGNOSIS — Z5181 Encounter for therapeutic drug level monitoring: Secondary | ICD-10-CM

## 2024-05-14 DIAGNOSIS — F4322 Adjustment disorder with anxiety: Secondary | ICD-10-CM | POA: Diagnosis not present

## 2024-05-14 DIAGNOSIS — E1142 Type 2 diabetes mellitus with diabetic polyneuropathy: Secondary | ICD-10-CM

## 2024-05-14 DIAGNOSIS — N3941 Urge incontinence: Secondary | ICD-10-CM

## 2024-05-14 DIAGNOSIS — F0150 Vascular dementia without behavioral disturbance: Secondary | ICD-10-CM

## 2024-05-14 DIAGNOSIS — Z1211 Encounter for screening for malignant neoplasm of colon: Secondary | ICD-10-CM

## 2024-05-14 DIAGNOSIS — E785 Hyperlipidemia, unspecified: Secondary | ICD-10-CM | POA: Diagnosis not present

## 2024-05-14 DIAGNOSIS — E1165 Type 2 diabetes mellitus with hyperglycemia: Secondary | ICD-10-CM

## 2024-05-14 DIAGNOSIS — E1169 Type 2 diabetes mellitus with other specified complication: Secondary | ICD-10-CM

## 2024-05-14 DIAGNOSIS — M48061 Spinal stenosis, lumbar region without neurogenic claudication: Secondary | ICD-10-CM

## 2024-05-14 DIAGNOSIS — Z7901 Long term (current) use of anticoagulants: Secondary | ICD-10-CM

## 2024-05-14 DIAGNOSIS — E119 Type 2 diabetes mellitus without complications: Secondary | ICD-10-CM

## 2024-05-14 MED ORDER — ROSUVASTATIN CALCIUM 40 MG PO TABS: ORAL_TABLET | ORAL | 3 refills | Status: AC

## 2024-05-14 NOTE — Progress Notes (Signed)
 Virtual Visit via Caregility   Note   This format is felt to be most appropriate for this patient at this time.  All issues noted in this document were discussed and addressed.  No physical exam was performed (except for noted visual exam findings with Video Visits).   I connected with Willam on 05/14/24 at  2:30 PM EDT by a video enabled telemedicine application and verified that I am speaking with the correct person using two identifiers. Location patient: home Location provider: work or home office Persons participating in the virtual visit: patient, provider and patient's wife Darice   I discussed the limitations, risks, security and privacy concerns of performing an evaluation and management service by telephone and the availability of in person appointments. I also discussed with the patient that there may be a patient responsible charge related to this service. The patient expressed understanding and agreed to proceed.  Reason for visit: chronic pain , decreased medication adherence. 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   Darice notes that [place] has been more prone to anger outbursts,  has been less attentive to his health.  He does admit that he has been missing doses of insulin ,  and did not change from ozempic  to mounjaro  as advised at last visit to improve his weight    2) Decreased mobility:  his hip pain has become significant.  He is not taking full advantage of the tramadol  immobilizing despite having adequate supplies, prescribed by me.  Because of his pain he has been showering less due to fear of falling. He has a walk in shower and a shower bench.   3) DM:  he has modified his Tresiba  dose and taking 70 units twice daily.  Taking jardiance  once daily,  Novolog  2 to 3 times daily with meals. Not using the CBG monitor, despite  having it available, not checking sugars more than once a day,  no sugars to report .  Not following a diabetic diet because Karen's sister has been cooking for him while Darice works.  Nutritional consult requested   4) Depression screen: he denies depression but endorse lack of motivation    ROS: See pertinent positives and negatives per HPI.  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 , receiving ceftriaxone  via PICC line 3/10  to 10/06/2023 at home with home health directed by [facility]      Fracture of one rib, left side, subsequent encounter for fracture with routine healing 08/27/2020   Hyperlipidemia    Hypertension    Influenza A 08/26/2023   Normal cardiac stress test 2012   Fath   NSTEMI (non-ST elevated myocardial infarction) (HCC) 08/26/2023   Sepsis due to pneumonia (HCC) 08/26/2023   Septicemia due to group D Streptococcus (HCC) 08/26/2023    Past Surgical History:  Procedure Laterality Date   CARDIAC VALVE REPLACEMENT N/A 09/10/2023   CORONARY STENT INTERVENTION N/A 06/25/2022   Procedure: CORONARY STENT INTERVENTION;  Surgeon: Florencio Cara BIRCH, MD;  Location: [facility] INVASIVE CV LAB;  Service: Cardiovascular;  Laterality: N/A;   CORONARY  STENT INTERVENTION N/A 06/26/2022   Procedure: CORONARY STENT INTERVENTION;  Surgeon: Florencio Cara BIRCH, MD;  Location: [facility] INVASIVE CV [facility];  Service: Cardiovascular;  Laterality: N/A;   EXOSTECTOMY Right 06/2019   5th MT head  ([facility])   LEFT HEART CATH AND CORONARY ANGIOGRAPHY N/A 06/25/2022   Procedure: LEFT HEART CATH AND CORONARY ANGIOGRAPHY WITH POSSIBLE STENT;  Surgeon: Florencio Cara BIRCH, MD;  Location: [facility] INVASIVE CV LAB;   Service: Cardiovascular;  Laterality: N/A;  12:30 following DC first case   LUMBAR DISC SURGERY     TEE WITHOUT CARDIOVERSION Right 09/02/2023   Procedure: TRANSESOPHAGEAL ECHOCARDIOGRAM (TEE);  Surgeon: Alluri, Keller BROCKS, MD;  Location: [facility] ORS;  Service: Cardiovascular;  Laterality: Right;    Family History  Problem Relation Age of Onset   Coronary artery disease Mother 70   COPD Mother    Heart disease Mother    Hyperlipidemia Mother    Hypertension Mother    Cancer Father    Hyperlipidemia Father    Hypertension Brother    Cancer Brother        lymphoma   Diabetes Maternal Aunt    Diabetes Maternal Uncle    Diabetes Maternal Grandmother    Heart disease Maternal Grandmother     SOCIAL HX:  reports that he has never smoked. He has never used smokeless tobacco. He reports that he does not drink alcohol and does not use drugs.    Current Outpatient Medications:    ASPIRIN  LOW DOSE 81 MG EC tablet, TAKE 1 TABLET BY MOUTH  DAILY (SWALLOW WHOLE), Disp: 90 tablet, Rfl: 1   blood glucose meter kit and supplies, Dispense based on patient and insurance preference. Use up to four times daily as directed. (FOR ICD-10 E10.9, E11.9). Use to check blood sugar three times daily, Disp: 1 each, Rfl: 0   Continuous Glucose Receiver (FREESTYLE LIBRE 3 READER) DEVI, Use to continuous check blood sugar, Disp: 1 each, Rfl: 0   Continuous Glucose Sensor (FREESTYLE LIBRE 3 PLUS SENSOR) MISC, Change sensor every 15 days., Disp: 2 each, Rfl: 5   empagliflozin  (JARDIANCE ) 25 MG TABS tablet, Take 1 tablet (25 mg total) by mouth daily., Disp: 90 tablet, Rfl: 3   ezetimibe  (ZETIA ) 10 MG tablet, Take 1 tablet (10 mg total) by mouth daily., Disp: 90 tablet, Rfl: 3   gabapentin  (NEURONTIN ) 100 MG capsule, TAKE 2 CAPSULES THREE TIMES DAILY, Disp: 540 capsule, Rfl: 3   insulin  aspart (NOVOLOG  FLEXPEN) 100 UNIT/ML FlexPen, Inject up to 25 units with breakfast, 25 units with lunch, and 30 units with supper, Disp:  72 mL, Rfl: 2   Insulin  Pen Needle 32G X 6 MM MISC, Use daily with insulin  pen, Disp: 50 each, Rfl: 0   ketoconazole (NIZORAL) 2 % cream, SMARTSIG:1 Application Topical 1 to 2 Times Daily, Disp: , Rfl:    Lancets (FREESTYLE) lancets, 1 each by Other route 2 (two) times daily. DX 250.02, Disp: 100 each, Rfl: 12   metoprolol  tartrate (LOPRESSOR ) 25 MG tablet, Take 0.5 tablets (12.5 mg total) by mouth 2 (two) times daily., Disp: 90 tablet, Rfl: 1   ONETOUCH VERIO test strip, USE TO CHECK BLOOD SUGAR 3  TIMES DAILY, Disp: 300 strip, Rfl: 3   senna-docusate (SENOKOT-S) 8.6-50 MG tablet, Take 2 tablets by mouth as needed., Disp: , Rfl:    spironolactone (ALDACTONE) 25 MG tablet, Take 1 tablet by mouth daily., Disp: , Rfl:    tamsulosin  (FLOMAX ) 0.4 MG CAPS capsule, TAKE 1  CAPSULE EVERY DAY, Disp: 90 capsule, Rfl: 3   traMADol  (ULTRAM ) 50 MG tablet, Take 1 tablet (50 mg total) by mouth every 6 (six) hours as needed., Disp: 120 tablet, Rfl: 2   triamcinolone  (KENALOG) 0.025 % cream, Apply topically., Disp: , Rfl:    warfarin (COUMADIN ) 5 MG tablet, TAKE 1 TABLET (5 MG TOTAL) BY MOUTH DAILY. ALTERNATING WITH 6 MG DAILY, Disp: 90 tablet, Rfl: 3   warfarin (COUMADIN ) 6 MG tablet, Take 1 tablet (6 mg total) by mouth daily. Alternating with 5 mg daily, Disp: 30 tablet, Rfl: 2   insulin  degludec (TRESIBA  FLEXTOUCH) 200 UNIT/ML FlexTouch Pen, 70 units SQ twice daily, Disp: 63 mL, Rfl: 2   rosuvastatin  (CRESTOR ) 40 MG tablet, Take 1 tablet (40 mg total) by mouth every evening for 360 doses.Take 1 tablet (40 mg total) by mouth every evening, Disp: 90 tablet, Rfl: 3   tirzepatide  (MOUNJARO ) 10 MG/0.5ML Pen, Inject 10 mg into the skin once a week. (Patient not taking: Reported on 05/14/2024), Disp: 6 mL, Rfl: 0  EXAM:  VITALS per patient if applicable:  GENERAL: alert, oriented, appears well and in no acute distress.  Interactive.   HEENT: atraumatic, conjunttiva clear, no obvious abnormalities on inspection of  external nose and ears  NECK: normal movements of the head and neck  LUNGS: on inspection no signs of respiratory distress, breathing rate appears normal, no obvious gross SOB, gasping or wheezing  CV: no obvious cyanosis  MS: moves all visible extremities without noticeable abnormality  PSYCH/NEURO: pleasant and cooperative, no obvious depression or anxiety, speech and thought processing grossly intact  ASSESSMENT AND PLAN: Colon cancer screening -     Cologuard  Type 2 diabetes mellitus with peripheral neuropathy (HCC) Assessment & Plan: loss of control was noted in August. Dose of Tresiba  was continued at 70 units q 12 hrs,  and novolog  doses with each meal  were modified to 40-30-45.  He has no sugars to report.  Home Health RN evaluation ordered for nutritional management.  Strongly urged to use the CBG for the next 2 weeks  and start Mounjaro  instead of continuing ozempic    Lab Results  Component Value Date   HGBA1C 9.1 (H) 02/18/2024      Type 2 diabetes mellitus with hyperlipidemia (HCC)  Obesity, diabetes, and hypertension syndrome (HCC) -     Ambulatory referral to Home Health  Morbid obesity with BMI of 45.0-49.9, adult [facility])  Vascular dementia without behavioral disturbance (HCC) Assessment & Plan: Now with occasional angry outbursts  per patient .  He has declined treatment for depression; but endorses  low motivation /   Orders: -     Ambulatory referral to Home Health  Spinal stenosis of lumbar region with radiculopathy Assessment & Plan: Complicated by left hip OA.  Encouraged to schedule his tramadol  to manage his pain more effectively.  Home PT ordered to maintain muscle strength while he awaits surgery   Orders: -     Ambulatory referral to Home Health  Urge incontinence of urine -     Urinalysis, Routine w reflex microscopic; Future -     Urine Culture; Future  Uncontrolled type 2 diabetes mellitus with hyperglycemia (HCC) -     Tresiba   FlexTouch; 70 units SQ twice daily  Dispense: 63 mL; Refill: 2  Anticoagulation goal of INR 2.5 to 3.5 Assessment & Plan: INR is therapuetic on regimen of   6 mg alt with 5 mg.    Lab Results  Component Value Date   INR 3.10 (A) 05/12/2024   INR 3.30 (A) 04/28/2024   INR 1.80 (A) 03/09/2024      Adjustment disorder with anxious mood Assessment & Plan: He declines pharmacotherapy   Other orders -     Rosuvastatin  Calcium ; Take 1 tablet (40 mg total) by mouth every evening for 360 doses.Take 1 tablet (40 mg total) by mouth every evening  Dispense: 90 tablet; Refill: 3      I discussed the assessment and treatment plan with the patient. The patient was provided an opportunity to ask questions and all were answered. The patient agreed with the plan and demonstrated an understanding of the instructions.   The patient was advised to call back or seek an in-person evaluation if the symptoms worsen or if the condition fails to improve as anticipated.   I spent 40 minutes dedicated to the care of this patient on the date of this encounter to include pre-visit review of patient's medical history,  including recent  orthopedics visit, imaging studies and labs, face-to-face time with the patient , and post visit ordering of testing and therapeutics.    Verneita LITTIE Kettering, MD

## 2024-05-16 MED ORDER — TRESIBA FLEXTOUCH 200 UNIT/ML ~~LOC~~ SOPN: PEN_INJECTOR | SUBCUTANEOUS | 2 refills | Status: AC

## 2024-05-16 NOTE — Assessment & Plan Note (Signed)
 Complicated by left hip OA.  Encouraged to schedule his tramadol  to manage his pain more effectively.  Home PT ordered to maintain muscle strength while he awaits surgery

## 2024-05-16 NOTE — Assessment & Plan Note (Addendum)
 loss of control was noted in August. Dose of Tresiba  was continued at 70 units q 12 hrs,  and novolog  doses with each meal  were modified to 40-30-45.  He has no sugars to report.  Home Health RN evaluation ordered for nutritional management.  Strongly urged to use the CBG for the next 2 weeks  and start Mounjaro  instead of continuing ozempic    Lab Results  Component Value Date   HGBA1C 9.1 (H) 02/18/2024

## 2024-05-16 NOTE — Assessment & Plan Note (Signed)
 Now with occasional angry outbursts  per patient .  He has declined treatment for depression; but endorses  low motivation /

## 2024-05-16 NOTE — Assessment & Plan Note (Signed)
 INR is therapuetic on regimen of   6 mg alt with 5 mg.    Lab Results  Component Value Date   INR 3.10 (A) 05/12/2024   INR 3.30 (A) 04/28/2024   INR 1.80 (A) 03/09/2024

## 2024-05-16 NOTE — Assessment & Plan Note (Signed)
 He declines pharmacotherapy

## 2024-05-18 ENCOUNTER — Encounter: Payer: Self-pay | Admitting: Internal Medicine

## 2024-05-18 ENCOUNTER — Telehealth: Payer: Self-pay

## 2024-05-18 LAB — POCT INR: INR: 2.6 — AB (ref 0.80–1.20)

## 2024-05-18 NOTE — Telephone Encounter (Signed)
 See my chart message

## 2024-05-18 NOTE — Telephone Encounter (Signed)
 INR is 2.6. I have abstracted.

## 2024-05-18 NOTE — Telephone Encounter (Signed)
 We received a fax for patient with INR results.  I sent a copy to Dr. Verneita Osmond folder on the S drive.

## 2024-05-25 ENCOUNTER — Telehealth: Payer: Self-pay | Admitting: Internal Medicine

## 2024-05-25 ENCOUNTER — Encounter: Payer: Self-pay | Admitting: Internal Medicine

## 2024-05-25 LAB — POCT INR: INR: 3.2 — AB (ref 0.80–1.20)

## 2024-05-25 NOTE — Telephone Encounter (Signed)
 INR results sent to S: drive.

## 2024-05-25 NOTE — Telephone Encounter (Signed)
 Sent pt a [employer] geologist.

## 2024-05-25 NOTE — Telephone Encounter (Signed)
 Pt's INR for 05/25/24 is 3.2. Already abstracted.

## 2024-05-25 NOTE — Telephone Encounter (Signed)
 See telephone encounter.

## 2024-05-26 DIAGNOSIS — Z1211 Encounter for screening for malignant neoplasm of colon: Secondary | ICD-10-CM | POA: Diagnosis not present

## 2024-06-01 ENCOUNTER — Encounter: Payer: Self-pay | Admitting: Internal Medicine

## 2024-06-01 LAB — LAB REPORT - SCANNED: INR: 3.4

## 2024-06-01 LAB — POCT INR: INR: 3.4 — AB (ref 0.80–1.20)

## 2024-06-01 NOTE — Telephone Encounter (Signed)
 I have abstracted.

## 2024-06-02 ENCOUNTER — Telehealth: Payer: Self-pay | Admitting: Internal Medicine

## 2024-06-02 ENCOUNTER — Encounter: Payer: Self-pay | Admitting: Internal Medicine

## 2024-06-02 NOTE — Telephone Encounter (Signed)
 open in error

## 2024-06-02 NOTE — Telephone Encounter (Signed)
 Sent via [employer]

## 2024-06-02 NOTE — Telephone Encounter (Signed)
 INR result for 06/01/2024 is 3.4. Abstracted.

## 2024-06-02 NOTE — Telephone Encounter (Signed)
 INR results received. Sent to S: drive.

## 2024-06-07 ENCOUNTER — Telehealth: Payer: Self-pay

## 2024-06-07 LAB — COLOGUARD: COLOGUARD: NEGATIVE

## 2024-06-07 NOTE — Telephone Encounter (Signed)
 PAP: Patient assistance application for Jardiance through [employer]) has been mailed to pt's home address on file. Provider portion of application will be faxed to provider's office.

## 2024-06-07 NOTE — Telephone Encounter (Signed)
 PAP: Patient assistance application for Novolog  and Tresiba  through [employer] has been mailed to pt's home address on file. Provider portion of application will be faxed to provider's office.

## 2024-06-08 ENCOUNTER — Ambulatory Visit: Payer: Self-pay | Admitting: Internal Medicine

## 2024-06-08 ENCOUNTER — Encounter: Payer: Self-pay | Admitting: Internal Medicine

## 2024-06-08 DIAGNOSIS — Z952 Presence of prosthetic heart valve: Secondary | ICD-10-CM | POA: Diagnosis not present

## 2024-06-08 LAB — POCT INR: INR: 3.4 — AB (ref 0.80–1.20)

## 2024-06-09 ENCOUNTER — Telehealth: Payer: Self-pay | Admitting: Internal Medicine

## 2024-06-09 NOTE — Telephone Encounter (Signed)
 Received Provider portion PAP application for Jardiance  ([place])

## 2024-06-09 NOTE — Telephone Encounter (Signed)
 Received provider portion PAP application [employer] for Novolog , Tresiba .

## 2024-06-09 NOTE — Telephone Encounter (Signed)
 INR results received and sent to S: drive.

## 2024-06-15 ENCOUNTER — Encounter: Payer: Self-pay | Admitting: Internal Medicine

## 2024-06-16 ENCOUNTER — Ambulatory Visit: Payer: Self-pay

## 2024-06-16 NOTE — Telephone Encounter (Signed)
 Pt is still refusing the ED or to go anywhere. The sore has stopped bleeding but there is still blood in his urine. Wife is concerned of dehydration and a UTI. She has a urine specimen cup at home and would like to know if she could bring urine to the office.

## 2024-06-16 NOTE — Telephone Encounter (Signed)
 INR was checked and the machine would not read yesterday multiple times Strips are being sent out and should be at the patient tomorrow per wife Wife states that the error stating it may be higher than 8.0 or testing error She states that she gave him--4mg  instead of 6mg  of Warfarin She states that at some point in the middle of the night, the patient picked at a scab and it was bleeding.  It kept bleeding through the band aid.  Wife called [facility] and she was told different techniques in testing to keep air from getting into it. Wife redid this using new techniques and still received the same error For the past two days, patient's urine has been dark and had blood in it. This started three days ago and she states this has progressively gotten darker---she states it is dark and has blood and she also states that he does have pain all over but she  Wife states that they have a specimen cup on hand from when they had a video conference with patient's PCP 05/14/2024 Patient has already told his wife that he does not want to go to the hospital  Wife is working from home and patient is in the bed  Called [facility] and spoke with Fairy found the DPR for the wife so wife was read this message regarding Warfarin that she was inquiring about in MyChart:    06/16/24  7:28 AM If you are having excessive bruising or experiencing any bleeding,  I would stop your warfarin.  Otherwise, reduce your dose by 50% (or skip every other day until you can retest)    FYI Only or Action Required?: Action required by provider: clinical question for provider, update on patient condition, and ER Refused by patient---wife wanted Dr Lula advice.  Patient was last seen in primary care on 05/14/2024 by Marylynn Verneita CROME, MD.  Called Nurse Triage reporting Hematuria.  Symptoms began several days ago.  Interventions attempted: Nothing.  Symptoms are: gradually worsening.  Triage Disposition: Go to ED Now (Notify  PCP)  Patient/caregiver understands and will follow disposition?: No, wishes to speak with PCP             Copied from CRM #[id number]. Topic: Clinical - Red Word Triage >> Jun 16, 2024  8:58 AM Deaijah H wrote: Red Word that prompted transfer to Nurse Triage: Patients wife called in requesting Dr. Lula nurse to give a call regarding his INR also stated he has blood in urine. Believes INR above 8.0 slow clotting in leg, Patients wife stated husband does not want to go to hospital or dr so is needing advice. Reason for Disposition  [1] Diffuse (all over) muscle pains in the shoulders, arms, legs, and back AND [2] dark (cola or tea-colored) or red-colored urine  Answer Assessment - Initial Assessment Questions INR was checked and the machine would not read yesterday multiple times Strips are being sent out and should be at the patient tomorrow per wife Wife states that the error stating it may be higher than 8.0 or testing error She states that she gave him--4mg  instead of 6mg  of Warfarin She states that at some point in the middle of the night, the patient picked at a scab and it was bleeding.  It kept bleeding through the band aid.  Wife called [facility] and she was told different techniques in testing to keep air from getting into it. Wife redid this using new techniques and still received the same error For  the past two days, patient's urine has been dark and had blood in it. This started three days ago and she states this has progressively gotten darker---she states it is dark and has blood and she also states that he does have pain all over but she states that has been going on since February. Wife states that they have a specimen cup on hand from when they had a video conference with patient's PCP 05/14/2024 Patient has already told his wife that he does not want to go to the hospital  Wife is working from home and patient is in the bed  Called [facility] and spoke with Fairy  found the DPR for the wife so wife was read this message regarding Warfarin that she was inquiring about in MyChart:    06/16/24  7:28 AM If you are having excessive bruising or experiencing any bleeding,  I would stop your warfarin.  Otherwise, reduce your dose by 50% (or skip every other day until you can retest)   Wife wanted to know any further advice about the Warfarin---she states she should get the new strips tomorrow and the bloody/dark urine.  She states he has been having frequent urination and also drinking tea and having dark/red urine. She states the patient states he is in excruciating pain and is homebound and does not go anywhere & she advised that he doesn't take pain medication either  Patient's wife is advised to call us  back if anything changes or with any further questions/concerns. She is advised that if anything worsens to go to the Emergency Room or call 911. She verbalized understanding.  Protocols used: Urine - Blood In-A-AH

## 2024-06-16 NOTE — Telephone Encounter (Unsigned)
 Copied from CRM [id number]. Topic: General - Other >> Jun 16, 2024  3:14 PM Brittany M wrote: Reason for CRM: patient wife returning call to [facility]- while on hold with office- phone crashed- called office back- they will call patient back

## 2024-06-16 NOTE — Telephone Encounter (Signed)
 Spoke with pt's wife and she stated that pt has agreed to go to the ED but not until tomorrow. Pt's wife was advised that if he has any symptom changes, sees more blood and bruising then he does not need to wait until tomorrow to go. Wife gave a verbal understanding.

## 2024-06-16 NOTE — Telephone Encounter (Signed)
 Pt's wife was made aware of message below.

## 2024-06-16 NOTE — Telephone Encounter (Signed)
 Pt's wife is aware but stated that pt will not come into the office. She stated that as soon as pt wakes up she will try checking his INR again and see if he will collect a urine specimen.

## 2024-06-16 NOTE — Telephone Encounter (Signed)
 Called [facility] and spoke with Karen---advised her that the wife called about patient's symptoms and patient is refusing the ER at this time

## 2024-06-16 NOTE — Telephone Encounter (Signed)
 LMTCB

## 2024-06-16 NOTE — Telephone Encounter (Unsigned)
 Copied from CRM [id number]. Topic: Clinical - Medical Advice >> Jun 16, 2024  1:06 PM Connor Ross wrote: Reason for CRM: Patients wife Ms. Connor Ross stated that the Patients INR is still coming up as an error of 8.0 is requesting a call back at [phone].

## 2024-06-17 DIAGNOSIS — N4889 Other specified disorders of penis: Secondary | ICD-10-CM | POA: Diagnosis not present

## 2024-06-17 DIAGNOSIS — Z7901 Long term (current) use of anticoagulants: Secondary | ICD-10-CM | POA: Diagnosis not present

## 2024-06-17 DIAGNOSIS — R310 Gross hematuria: Secondary | ICD-10-CM | POA: Diagnosis not present

## 2024-06-17 DIAGNOSIS — R300 Dysuria: Secondary | ICD-10-CM | POA: Diagnosis not present

## 2024-06-17 DIAGNOSIS — R791 Abnormal coagulation profile: Secondary | ICD-10-CM | POA: Diagnosis not present

## 2024-06-17 NOTE — ED Provider Notes (Signed)
 Emergency Department Provider Note    ED Clinical Impression   Final diagnoses:  None    ED Assessment/Plan    58yo M patient presents with gross hematuria in the setting of supratherapeutic INR due to coumadin  therapy, as well as dysuria. The most likely etiology is anticoagulation-related bleeding from the urinary tract, which can present as hematuria without other signs of systemic bleeding. UTI is also on the differential, given the dysuria, and will be evaluated with urinalysis and urine culture.   At present, the patient does not have any abdominal pain, hemodynamic instability, a significant drop in hemoglobin out of proportion to urinary blood loss, or peritoneal signs to suggest intraabdominal or retroperitoneal hemorrhage. Therefore, abdominal CT imaging is not indicated at this time.   The primary management focus will be to correct the INR, monitor for ongoing bleeding or clinical deterioration, and evaluate for urinary tract infection or other local causes of hematuria. Should the patient develop concerning features such as abdominal pain, hypotension, or evidence of bleeding outside the urinary tract, further imaging and expanded workup would be warranted.   11:16 AM The patient presents with gross hematuria and dysuria while on warfarin, found to have a markedly supratherapeutic INR of 10.68. Urinalysis is consistent with a hemorrhagic urinary tract infection. Indeed, per [facility], Hematuria in an anticoagulated patient is likely to be due to factors other than the anticoagulation (eg, anatomic lesion, infection)....  According to the 2012 ACCP guidelines, the recommended management for an INR >10 without evidence of major bleeding is to hold warfarin and administer oral vitamin K or one-time dose. The patient has also been started on appropriate antibiotics for UTI (ceftriaxone  during the ED, followed by cefpodoxime prescription).  A renal/bladder ultrasound was performed and  shows no evidence of clot retention or obstruction. CBC reveals a hemoglobin of 14.9, indicating no significant anemia, and there is no evidence of ongoing or major bleeding. The patient is hemodynamically stable with vital signs within normal limits and is currently asymptomatic. He is able to tolerate oral medications and is reliable for follow-up.  Given the absence of hemodynamic instability, significant anemia, ongoing bleeding, or urinary retention, and the patient's ability to take oral medications and comply with follow-up, discharge home is appropriate and consistent with current guidelines. The patient has been counseled to hold warfarin, complete his antibiotic course, avoid NSAIDs or other anticoagulants, and return immediately for any worsening symptoms such as increased bleeding, weakness, dizziness, or inability to urinate. Arrangements have been made for close outpatient follow-up, including repeat INR and clinical reassessment within 24-48 hours; patient and his wife state that they can do this and have very close management of INR. Admission is not indicated at this time, as the patient meets all criteria for safe outpatient management.  The patient was counseled that while today's evaluation is reassuring, no test or workup can eliminate all risk, and symptoms may evolve.   I discussed the clinical findings, diagnostic limitations, and potential risks of deterioration with the patient and his wife in detail. In a pleasant and collaborative conversation, we reviewed the reasoning behind the current plan and why admission or additional testing was not pursued at this time. I emphasized the importance of returning for any concerning changes.  Clear return precautions and follow-up recommendations were provided verbally and in writing. The patient verbalized understanding, had all questions answered, and agreed with the discharge plan. They agreed with the plan and commitment to seek immediate  medical attention for any  recurrence or worsening symptoms.     History   Chief Complaint  Patient presents with  . Hematuria   HPI History of Present Illness Connor Ross is a 58 year old male with a mechanical mitral valve replacement who presents with gross hematuria. He is accompanied by his wife.  He has been experiencing gross hematuria and concerns about a potentially elevated INR, as his home INR machine has been giving error messages indicating a possible INR over 8.0 for the past two days. His INR was last checked a week ago and was 3.4. There have been no changes in his medication regimen, and the INR machine was functioning properly until the recent errors.  He experiences pain in his penis, described as a sensation of needing to urinate, and sensations at the tip of his penis, although he urinates from the base due to a previous surgical procedure. He has difficulty holding urine, requiring the use of protective garments. No new pain in his kidneys or sharp pains elsewhere.  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 He has a history of six herniated discs, three in the lower back and three in the upper back, and requires two hip replacements. He experiences significant pain, which limits his ability to shower regularly, contributing to concerns about a possible urinary tract infection. He uses a cane for mobility and has difficulty standing due to hip pain.    Past Medical History[1]  Past Surgical History[2]  Family History[3]  Social History[4]  Review of Systems  Physical Exam   BP 144/83   Pulse 98   Temp 36.4 C (97.5 F) (Oral)   Resp 18   Ht 180.3 cm (5' 11)   Wt (!) 137.4 kg (302 lb 14.4 oz)   SpO2 97%   BMI 42.25 kg/m   Physical Exam Vitals and nursing note reviewed.  Constitutional:      Appearance: Normal appearance. He is well-developed.   HENT:     Head: Normocephalic and atraumatic.  Eyes:     Extraocular Movements: Extraocular movements intact.  Pulmonary:     Effort: Pulmonary effort is normal.  Abdominal:     Tenderness: There is no abdominal tenderness. There is no guarding.     Hernia: No hernia is present.  Musculoskeletal:        General: Normal range of motion.     Cervical back: Normal range of motion.  Skin:    General: Skin is dry.     Coloration: Skin is not jaundiced or pale.  Neurological:     General: No focal deficit present.     Mental Status: He is alert and oriented to person, place, and time.     Motor: No abnormal muscle tone.     Coordination: Coordination normal.  Psychiatric:        Behavior: Behavior normal.     ED Course       Medical Decision Making Amount and/or Complexity of Data Reviewed Labs: ordered. Radiology: ordered.  Risk Prescription drug management.     ===== CONSOLIDATED BILLING DOCUMENTATION =====     DATA REVIEWED AND ANALYZED  Tests, Orders, and External Documentation:  . Unique labs Reviewed:  CBC w/ Differential: Mild leukocytosis (WBC 11.7, neutrophil predominance), otherwise normal indices; no anemia or thrombocytopenia. Comprehensive Metabolic Panel (CMP): Renal and hepatic function preserved; electrolytes, glucose, and albumin within normal limits.  Urinalysis: Turbid, orange urine with large leukocyte esterase, positive nitrite, marked pyuria (>182 WBC), marked hematuria (large blood, 60 RBC), proteinuria, glucosuria (>1000 mg/dL), and many bacteria/WBC clumps; findings consistent with acute urinary tract infection and active bleeding. Urine Culture: Collected, pending analysis. PT/INR: Markedly supratherapeutic (PT 116.2, INR 10.68), indicating high risk for bleeding. aPTT: Markedly elevated (100.4). Magnesium : Normal (1.9).  . Diagnostic tests considered but not done: CTAP; however, per medical decision making above, this is not indicated.  .  My assessment required discussion with an independent historian, and is included in the HPI above:  Wife at the bedside, who supplemented the history by providing clarifying historical details that were not mentioned by the patient.      RISK OF COMPLICATIONS / DISPOSITION  . Disposition Decision ? Discharged.  Patient does not meet inpatient criteria and appears clinically stable at this time for outpatient follow up.  . Patient/Family/Caregiver Discussions:  Explained need for close follow-up with PCP/specialist. Discussed specific red flag symptoms requiring immediate return to ED. Extensive, detailed discussion held with patient and wife regarding diagnosis, management options, and rationale for current treatment plan. Addressed patient's ongoing concerns and anxieties regarding potential complications and prognosis, including fears of adverse outcomes despite evidence-based reassurance. Explained rationale for outpatient management, and provided anticipatory guidance regarding the natural history of the condition.  . Prescription Drug Management: Cefpodoxime p.o. x 10 days        [1] No past medical history on file. [2] No past surgical history on file. [3] No family history on file. [4]   Social Drivers of Health   Food Insecurity: No Food Insecurity (05/12/2024)   Received from [facility]   Hunger Vital Sign   . Within the past 12 months, you worried that your food would run out before you got the money to buy more.: Never true   . Within the past 12 months, the food you bought just didn't last and you didn't have money to get more.: Never true  Tobacco Use: Low Risk  (05/14/2024)   Received from [facility]   Patient History   . Smoking Tobacco Use: Never   . Smokeless Tobacco Use: Never  Transportation Needs: No Transportation Needs (05/12/2024)   Received from [facility] - Transportation   . In the past 12 months, has lack of transportation kept you  from medical appointments or from getting medications?: No   . In the past 12 months, has lack of transportation kept you from meetings, work, or from getting things needed for daily living?: No  Housing: Low Risk  (10/01/2023)   Received from [facility]   Housing Stability Vital Sign   . In the last 12 months, was there a time when you were not able to pay the mortgage or rent on time?: No   . In the past 12 months, how many times have you moved where you were living?: 0   . At any time in the past 12 months, were you homeless or living in a shelter (including now)?: No  Physical Activity: Inactive (05/12/2024)   Received from [facility]   Exercise Vital Sign   . On average, how many days per week do you engage in moderate to strenuous exercise (like a brisk walk)?: 0 days  Utilities: Not At Risk (01/26/2024)   Received from [facility]   . In the past 12 months has the electric, gas, oil, or water company  threatened to shut off services in your home?: No  Stress: No Stress Concern Present (05/12/2024)   Received from [facility] - Occupational Stress Questionnaire   . Do you feel stress - tense, restless, nervous, or anxious, or unable to sleep at night because your mind is troubled all the time - these days?: Only a little  Interpersonal Safety: Not At Risk (06/17/2024)   Interpersonal Safety   . Unsafe Where You Currently Live: No   . Physically Hurt by Anyone: No   . Abused by Anyone: No  Social Connections: Socially Isolated (05/12/2024)   Received from [facility]   Social Connection and Isolation Panel   . In a typical week, how many times do you talk on the phone with family, friends, or neighbors?: Once a week   . How often do you get together with friends or relatives?: Never   . How often do you attend church or religious services?: Never   . Do you belong to any clubs or organizations such as  church groups, unions, fraternal or athletic groups, or school groups?: No   . Are you married, widowed, divorced, separated, never married, or living with a partner?: Married  Physicist, Medical Strain: Low Risk  (05/12/2024)   Received from [facility]   Overall Financial Resource Strain (CARDIA)   . How hard is it for you to pay for the very basics like food, housing, medical care, and heating?: Not hard at all  Health Literacy: Adequate Health Literacy (01/26/2024)   Received from [facility]   B1300 Health Literacy   . How often do you need to have someone help you when you read instructions, pamphlets, or other written material from your doctor or pharmacy?: Never   Rojelio Kayla ORN, MD 06/17/24 1921

## 2024-06-18 ENCOUNTER — Other Ambulatory Visit: Payer: Self-pay | Admitting: Internal Medicine

## 2024-06-19 ENCOUNTER — Encounter: Payer: Self-pay | Admitting: Internal Medicine

## 2024-06-19 DIAGNOSIS — R791 Abnormal coagulation profile: Secondary | ICD-10-CM | POA: Insufficient documentation

## 2024-06-21 LAB — POCT INR: INR: 1.1 (ref 0.80–1.20)

## 2024-06-22 ENCOUNTER — Telehealth: Payer: Self-pay | Admitting: Internal Medicine

## 2024-06-22 NOTE — Telephone Encounter (Signed)
 Copied from CRM #[id number]. Topic: Clinical - Lab/Test Results >> Jun 22, 2024 11:43 AM Mercedes MATSU wrote: Reason for CRM: [facility] called in stating they had an out of range lab for a patient of Dr. Tullos and is requesting to speak with a nurse. While attempting to transfer to [facility] lab disconnected. Can be reached at [phone] is the number they called from.

## 2024-06-22 NOTE — Telephone Encounter (Signed)
 Already received and has been addressed with provider and pt's wife.

## 2024-06-22 NOTE — Telephone Encounter (Signed)
 INR result for 06/21/2024 is 1.1. Spoke with pt's wife and she stated that pt has been off coumadin  since hospital visit but gave him 5 mg last night after seeing the INR result of 1.1.  Abstracted.

## 2024-06-22 NOTE — Telephone Encounter (Signed)
 INR Received on 06/22/24 sent to Dr. Marylynn [address].

## 2024-06-22 NOTE — Telephone Encounter (Signed)
 Copied from CRM #[id number]. Topic: Clinical - Medical Advice >> Jun 22, 2024  2:34 PM Vena HERO wrote: Reason for CRM: [phone]) from [facility] calling to report out of range inr. Pt INR is 1.1 yesterday 12/08

## 2024-06-22 NOTE — Telephone Encounter (Signed)
Pt's wife is aware and gave a verbal understanding.

## 2024-06-23 NOTE — Telephone Encounter (Signed)
 Spoke with Connor Ross to let him know that we have been receiving the INR results.

## 2024-06-23 NOTE — Telephone Encounter (Unsigned)
 Copied from CRM #[id number]. Topic: Referral - Question >> Jun 23, 2024  1:10 PM Harlene ORN wrote: Reason for CRM: [facility] Rep is looking to speak to a staff member in charge of the home INR referrals; wants to know if the practice is receiving the INR referral and if they received the original as well. Please call back to discuss. Phone: [phone] ext [phone]

## 2024-06-25 ENCOUNTER — Encounter: Payer: Self-pay | Admitting: Internal Medicine

## 2024-06-25 ENCOUNTER — Telehealth: Payer: Self-pay | Admitting: Internal Medicine

## 2024-06-25 LAB — POCT INR: INR: 1.4 — AB (ref 0.80–1.20)

## 2024-06-25 NOTE — Telephone Encounter (Signed)
 Duplicate entry. See telephone note dated 06/22/24

## 2024-06-25 NOTE — Telephone Encounter (Signed)
 INR result received. Sent to S drive.

## 2024-06-25 NOTE — Telephone Encounter (Signed)
 INR 1.4.  Abstracting result into chart also.

## 2024-06-25 NOTE — Telephone Encounter (Unsigned)
 Copied from CRM #[id number]. Topic: Clinical - Lab/Test Results >> Jun 25, 2024  1:53 PM Viola F wrote: Reason for CRM: Dena from [facility] called regarding patients out of range INR results, they are 1.4 today. Her call back number if needed is [phone]

## 2024-06-25 NOTE — Telephone Encounter (Signed)
 Pt is aware and gave a verbal understanding.

## 2024-06-29 NOTE — Telephone Encounter (Signed)
 PAP: Application for Novolog  and Tresiba  has been submitted to [employer], via fax

## 2024-06-29 NOTE — Telephone Encounter (Signed)
 PAP: Application for Bernadine has been submitted to [employer]), via fax

## 2024-07-02 ENCOUNTER — Encounter: Payer: Self-pay | Admitting: Internal Medicine

## 2024-07-02 ENCOUNTER — Telehealth: Payer: Self-pay

## 2024-07-02 LAB — POCT INR: INR: 3.9 — AB (ref 0.80–1.20)

## 2024-07-02 NOTE — Telephone Encounter (Signed)
 Spoke with pt's wife and gave her the corrected information to stop the 6 mg, take no coumadin  tonight and resume with 5 mg tomorrow. Wife gave a verbal understanding.

## 2024-07-02 NOTE — Telephone Encounter (Signed)
 Pt's INR result for today is 3.9.   Abstracted.

## 2024-07-02 NOTE — Telephone Encounter (Signed)
 We received INR test results for patient from [facility] today via fax.  I sent a copy to Dr. Verneita Osmond folder on the S drive.

## 2024-07-09 ENCOUNTER — Other Ambulatory Visit (HOSPITAL_COMMUNITY): Payer: Self-pay

## 2024-07-10 LAB — POCT INR: INR: 2.5 — AB (ref 0.80–1.20)

## 2024-07-10 LAB — PROTIME-INR: INR: 2.5 — AB (ref 0.80–1.20)

## 2024-07-12 ENCOUNTER — Telehealth: Payer: Self-pay

## 2024-07-12 NOTE — Telephone Encounter (Signed)
 INR results for 07/10/2024 is 2.5.   Abstracted

## 2024-07-12 NOTE — Telephone Encounter (Signed)
Pt's wife is aware and gave a verbal understanding.

## 2024-07-12 NOTE — Telephone Encounter (Signed)
 We received INR results for patient from [facility] via fax.  I sent a copy to Dr. Verneita Osmond folder on the S drive.

## 2024-07-16 ENCOUNTER — Telehealth: Payer: Self-pay | Admitting: Internal Medicine

## 2024-07-16 LAB — POCT INR: INR: 2.7 — AB (ref 0.80–1.20)

## 2024-07-16 NOTE — Telephone Encounter (Signed)
 Pt's INR for 07/16/2024 is 2.7.   Abstracted.

## 2024-07-16 NOTE — Telephone Encounter (Signed)
 INR has been sent to Dr. Marylynn [address].

## 2024-07-22 NOTE — Telephone Encounter (Signed)
 PAP: Patient assistance application for Novolog  and Tresiba  has been approved by PAP Companies: [employer] from 07/22/2024 to 07/14/2025. Medication should be delivered to PAP Delivery: Provider's office. For further shipping updates, please contact [employer] at [phone]. Patient ID is: not provided.

## 2024-07-23 LAB — POCT INR: INR: 2.9 — AB (ref 0.80–1.20)

## 2024-07-26 ENCOUNTER — Telehealth: Payer: Self-pay

## 2024-07-26 NOTE — Telephone Encounter (Signed)
 Spoke with pt's wife and informed her of pt's INR results. Wife gave a verbal understanding. Wife stated that when they saw Dr. Florencio last week he told them that he would like for pt's INR to stay on the higher side of his range. Wife stated that she has been alternating pt's dose with 6 mg and 5 mg.

## 2024-07-26 NOTE — Telephone Encounter (Signed)
 We received INR results for patient via fax from [facility].  I sent a copy to Dr. Verneita Osmond folder on the S drive.

## 2024-07-26 NOTE — Telephone Encounter (Signed)
 Pt's INR result for 07/23/2024 is 2.9.   Abstracted.

## 2024-07-27 ENCOUNTER — Telehealth: Payer: Self-pay

## 2024-07-27 NOTE — Telephone Encounter (Signed)
 Called and spoke with pt to notify him that we have received 3 boxes of Novofine 32G pen needles.  Pt reported that he would pick them up when other medication comes in.

## 2024-07-27 NOTE — Telephone Encounter (Signed)
Pt's wife is aware and gave a verbal understanding.

## 2024-07-29 ENCOUNTER — Other Ambulatory Visit: Payer: Self-pay | Admitting: Internal Medicine

## 2024-07-29 DIAGNOSIS — I152 Hypertension secondary to endocrine disorders: Secondary | ICD-10-CM

## 2024-07-30 ENCOUNTER — Telehealth: Payer: Self-pay | Admitting: Internal Medicine

## 2024-07-30 ENCOUNTER — Telehealth: Admitting: Internal Medicine

## 2024-07-30 ENCOUNTER — Encounter: Payer: Self-pay | Admitting: Internal Medicine

## 2024-07-30 VITALS — Ht 71.0 in | Wt 338.0 lb

## 2024-07-30 DIAGNOSIS — Z7985 Long-term (current) use of injectable non-insulin antidiabetic drugs: Secondary | ICD-10-CM | POA: Diagnosis not present

## 2024-07-30 DIAGNOSIS — M25551 Pain in right hip: Secondary | ICD-10-CM | POA: Diagnosis not present

## 2024-07-30 DIAGNOSIS — M5416 Radiculopathy, lumbar region: Secondary | ICD-10-CM | POA: Diagnosis not present

## 2024-07-30 DIAGNOSIS — M48061 Spinal stenosis, lumbar region without neurogenic claudication: Secondary | ICD-10-CM | POA: Diagnosis not present

## 2024-07-30 DIAGNOSIS — Z7409 Other reduced mobility: Secondary | ICD-10-CM | POA: Diagnosis not present

## 2024-07-30 DIAGNOSIS — M25552 Pain in left hip: Secondary | ICD-10-CM

## 2024-07-30 DIAGNOSIS — Z7901 Long term (current) use of anticoagulants: Secondary | ICD-10-CM | POA: Diagnosis not present

## 2024-07-30 DIAGNOSIS — Z5181 Encounter for therapeutic drug level monitoring: Secondary | ICD-10-CM

## 2024-07-30 DIAGNOSIS — G8929 Other chronic pain: Secondary | ICD-10-CM

## 2024-07-30 LAB — POCT INR: POC INR: 2.2

## 2024-07-30 MED ORDER — TRAMADOL HCL 50 MG PO TABS: 100.0000 mg | ORAL_TABLET | Freq: Two times a day (BID) | ORAL | 5 refills | Status: AC | PRN

## 2024-07-30 MED ORDER — TIRZEPATIDE 10 MG/0.5ML ~~LOC~~ SOAJ: 10.0000 mg | SUBCUTANEOUS | 0 refills | Status: AC

## 2024-07-30 NOTE — Progress Notes (Unsigned)
 Virtual Visit via Caregility   Note   This format is felt to be most appropriate for this patient at this time.  All issues noted in this document were discussed and addressed.  No physical exam was performed (except for noted visual exam findings with Video Visits).   I connected withNAME@ on 07/30/24 at  4:30 PM EST by a video enabled telemedicine application or telephone and verified that I am speaking with the correct person using two identifiers. Location patient: home Location provider: work or home office Persons participating in the virtual visit: patient, provider  I discussed the limitations, risks, security and privacy concerns of performing an evaluation and management service by telephone and the availability of in person appointments. I also discussed with the patient that there may be a patient responsible charge related to this service. The patient expressed understanding and agreed to proceed.  Interactive audio and video telecommunications were attempted between this provider and patient, however failed, due to patient having technical difficulties OR patient did not have access to video capability.  We continued and completed visit with audio only. ***  Reason for visit: 1) type  2 DM.   2) vascular dementia 3) coumadin  management  4) bilateral hip pain   HPI:   Type 2 DM   ROS: See pertinent positives and negatives per HPI.  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 , receiving ceftriaxone  via PICC line 3/10  to 10/06/2023 at home with home health directed by [facility]      Fracture of one rib, left side, subsequent encounter for fracture with routine healing 08/27/2020    Hyperlipidemia    Hypertension    Influenza A 08/26/2023   Normal cardiac stress test 2012   Fath   NSTEMI (non-ST elevated myocardial infarction) (HCC) 08/26/2023   Sepsis due to pneumonia (HCC) 08/26/2023   Septicemia due to group D Streptococcus (HCC) 08/26/2023    Past Surgical History:  Procedure Laterality Date   CARDIAC VALVE REPLACEMENT N/A 09/10/2023   CORONARY STENT INTERVENTION N/A 06/25/2022   Procedure: CORONARY STENT INTERVENTION;  Surgeon: Florencio Cara BIRCH, MD;  Location: [facility] IN[facility]IVE [facility];  Service: Cardiovascular;  Laterality: N/A;   CORONARY STENT INTERVENTION N/A 06/26/2022   Procedure: CORONARY STENT INTERVENTION;  Surgeon: Florencio Cara BIRCH, MD;  Location: [facility];  Service: Cardiovascular;  Laterality: N/A;   EXOSTECTOMY Right 06/2019   5th MT head  ([facility])   LEFT HEART CATH AND CORONARY ANGIOGRAPHY N/A 06/25/2022   Procedure: LEFT HEART CATH AND CORONARY ANGIOGRAPHY WITH POSSIBLE STENT;  Surgeon: Florencio Cara BIRCH, MD;  Location: [facility];  Service: Cardiovascular;  Laterality: N/A;  12:30 following DC first case   LUMBAR DISC SURGERY     TEE WITHOUT CARDIOVERSION Right 09/02/2023   Procedure: TRANSESOPHAGEAL ECHOCARDIOGRAM (TEE);  Surgeon: Alluri, Keller BROCKS, MD;  Location: [facility] ORS;  Service: Cardiovascular;  Laterality: Right;    Family History  Problem Relation Age of Onset   Coronary artery disease Mother 56   COPD Mother    Heart disease Mother    Hyperlipidemia Mother    Hypertension Mother    Cancer Father  Hyperlipidemia Father    Hypertension Brother    Cancer Brother        lymphoma   Diabetes Maternal Aunt    Diabetes Maternal Uncle    Diabetes Maternal Grandmother    Heart disease Maternal Grandmother     SOCIAL HX: ***  Current Medications[1]  EXAM:  VITALS per patient if applicable:  GENERAL: alert, oriented, appears well and in no acute distress  HEENT: atraumatic, conjunttiva clear,  no obvious abnormalities on inspection of external nose and ears  NECK: normal movements of the head and neck  LUNGS: on inspection no signs of respiratory distress, breathing rate appears normal, no obvious gross SOB, gasping or wheezing  CV: no obvious cyanosis  MS: moves all visible extremities without noticeable abnormality  PSYCH/NEURO: pleasant and cooperative, no obvious depression or anxiety, speech and thought processing grossly intact  ASSESSMENT AND PLAN: There are no diagnoses linked to this encounter.    I discussed the assessment and treatment plan with the patient. The patient was provided an opportunity to ask questions and all were answered. The patient agreed with the plan and demonstrated an understanding of the instructions.   The patient was advised to call back or seek an in-person evaluation if the symptoms worsen or if the condition fails to improve as anticipated.   I spent 30 minutes dedicated to the care of this patient on the date of this encounter to include pre-visit review of patient's medical history,  including recent ER visit, imaging studies and labs, face-to-face time with the patient , and post visit ordering of testing and therapeutics.    Verneita LITTIE Kettering, MD     [1]  Current Outpatient Medications:    ASPIRIN  LOW DOSE 81 MG EC tablet, TAKE 1 TABLET BY MOUTH  DAILY (SWALLOW WHOLE), Disp: 90 tablet, Rfl: 1   blood glucose meter kit and supplies, Dispense based on patient and insurance preference. Use up to four times daily as directed. (FOR ICD-10 E10.9, E11.9). Use to check blood sugar three times daily, Disp: 1 each, Rfl: 0   Continuous Glucose Receiver (FREESTYLE LIBRE 3 READER) DEVI, Use to continuous check blood sugar, Disp: 1 each, Rfl: 0   Continuous Glucose Sensor (FREESTYLE LIBRE 3 PLUS SENSOR) MISC, Change sensor every 15 days., Disp: 2 each, Rfl: 5   empagliflozin  (JARDIANCE ) 25 MG TABS tablet, Take 1 tablet (25 mg total) by mouth daily.,  Disp: 90 tablet, Rfl: 3   ezetimibe  (ZETIA ) 10 MG tablet, Take 1 tablet (10 mg total) by mouth daily., Disp: 90 tablet, Rfl: 3   gabapentin  (NEURONTIN ) 100 MG capsule, TAKE 2 CAPSULES THREE TIMES DAILY, Disp: 540 capsule, Rfl: 3   insulin  aspart (NOVOLOG  FLEXPEN) 100 UNIT/ML FlexPen, Inject up to 25 units with breakfast, 25 units with lunch, and 30 units with supper, Disp: 72 mL, Rfl: 2   insulin  degludec (TRESIBA  FLEXTOUCH) 200 UNIT/ML FlexTouch Pen, 70 units SQ twice daily, Disp: 63 mL, Rfl: 2   Insulin  Pen Needle 32G X 6 MM MISC, Use daily with insulin  pen, Disp: 50 each, Rfl: 0   ketoconazole (NIZORAL) 2 % cream, SMARTSIG:1 Application Topical 1 to 2 Times Daily, Disp: , Rfl:    Lancets (FREESTYLE) lancets, 1 each by Other route 2 (two) times daily. DX 250.02, Disp: 100 each, Rfl: 12   metoprolol  tartrate (LOPRESSOR ) 25 MG tablet, Take 0.5 tablets (12.5 mg total) by mouth 2 (two) times daily., Disp: 90 tablet, Rfl: 1   ONETOUCH VERIO test  strip, USE TO CHECK BLOOD SUGAR 3  TIMES DAILY, Disp: 300 strip, Rfl: 3   rosuvastatin  (CRESTOR ) 40 MG tablet, Take 1 tablet (40 mg total) by mouth every evening for 360 doses.Take 1 tablet (40 mg total) by mouth every evening, Disp: 90 tablet, Rfl: 3   senna-docusate (SENOKOT-S) 8.6-50 MG tablet, Take 2 tablets by mouth as needed., Disp: , Rfl:    spironolactone (ALDACTONE) 25 MG tablet, Take 1 tablet by mouth daily., Disp: , Rfl:    tamsulosin  (FLOMAX ) 0.4 MG CAPS capsule, TAKE 1 CAPSULE EVERY DAY, Disp: 90 capsule, Rfl: 3   tirzepatide  (MOUNJARO ) 10 MG/0.5ML Pen, Inject 10 mg into the skin once a week. (Patient not taking: Reported on 05/14/2024), Disp: 6 mL, Rfl: 0   traMADol  (ULTRAM ) 50 MG tablet, Take 1 tablet (50 mg total) by mouth every 6 (six) hours as needed., Disp: 120 tablet, Rfl: 2   triamcinolone  (KENALOG) 0.025 % cream, Apply topically., Disp: , Rfl:    warfarin (COUMADIN ) 5 MG tablet, TAKE 1 TABLET (5 MG TOTAL) BY MOUTH DAILY. ALTERNATING WITH 6  MG DAILY, Disp: 90 tablet, Rfl: 3   warfarin (COUMADIN ) 6 MG tablet, Take 1 tablet (6 mg total) by mouth daily. Alternating with 5 mg daily, Disp: 30 tablet, Rfl: 2

## 2024-07-30 NOTE — Telephone Encounter (Signed)
 Handed Dr Marylynn hard copy of INR results, patient has an appointment today. Dr Marylynn handed back to front office to processes into patient's chart.

## 2024-07-30 NOTE — Progress Notes (Unsigned)
 "  Subjective:  Patient ID: Connor Ross, male    DOB: 11-22-1965  Age: 59 y.o. MRN: [id number]  CC: There were no encounter diagnoses.   HPI Connor Ross presents for No chief complaint on file.     Outpatient Medications Prior to Visit  Medication Sig Dispense Refill   ASPIRIN  LOW DOSE 81 MG EC tablet TAKE 1 TABLET BY MOUTH  DAILY (SWALLOW WHOLE) 90 tablet 1   blood glucose meter kit and supplies Dispense based on patient and insurance preference. Use up to four times daily as directed. (FOR ICD-10 E10.9, E11.9). Use to check blood sugar three times daily 1 each 0   Continuous Glucose Receiver (FREESTYLE LIBRE 3 READER) DEVI Use to continuous check blood sugar 1 each 0   Continuous Glucose Sensor (FREESTYLE LIBRE 3 PLUS SENSOR) MISC Change sensor every 15 days. 2 each 5   empagliflozin  (JARDIANCE ) 25 MG TABS tablet Take 1 tablet (25 mg total) by mouth daily. 90 tablet 3   ezetimibe  (ZETIA ) 10 MG tablet Take 1 tablet (10 mg total) by mouth daily. 90 tablet 3   gabapentin  (NEURONTIN ) 100 MG capsule TAKE 2 CAPSULES THREE TIMES DAILY 540 capsule 3   insulin  aspart (NOVOLOG  FLEXPEN) 100 UNIT/ML FlexPen Inject up to 25 units with breakfast, 25 units with lunch, and 30 units with supper 72 mL 2   insulin  degludec (TRESIBA  FLEXTOUCH) 200 UNIT/ML FlexTouch Pen 70 units SQ twice daily 63 mL 2   Insulin  Pen Needle 32G X 6 MM MISC Use daily with insulin  pen 50 each 0   ketoconazole (NIZORAL) 2 % cream SMARTSIG:1 Application Topical 1 to 2 Times Daily     Lancets (FREESTYLE) lancets 1 each by Other route 2 (two) times daily. DX 250.02 100 each 12   metoprolol  tartrate (LOPRESSOR ) 25 MG tablet Take 0.5 tablets (12.5 mg total) by mouth 2 (two) times daily. 90 tablet 1   ONETOUCH VERIO test strip USE TO CHECK BLOOD SUGAR 3  TIMES DAILY 300 strip 3   rosuvastatin  (CRESTOR ) 40 MG tablet Take 1 tablet (40 mg total) by mouth every evening for 360 doses.Take 1 tablet (40 mg total) by mouth  every evening 90 tablet 3   senna-docusate (SENOKOT-S) 8.6-50 MG tablet Take 2 tablets by mouth as needed.     spironolactone (ALDACTONE) 25 MG tablet Take 1 tablet by mouth daily.     tamsulosin  (FLOMAX ) 0.4 MG CAPS capsule TAKE 1 CAPSULE EVERY DAY 90 capsule 3   tirzepatide  (MOUNJARO ) 10 MG/0.5ML Pen Inject 10 mg into the skin once a week. (Patient not taking: Reported on 05/14/2024) 6 mL 0   traMADol  (ULTRAM ) 50 MG tablet Take 1 tablet (50 mg total) by mouth every 6 (six) hours as needed. 120 tablet 2   triamcinolone  (KENALOG) 0.025 % cream Apply topically.     warfarin (COUMADIN ) 5 MG tablet TAKE 1 TABLET (5 MG TOTAL) BY MOUTH DAILY. ALTERNATING WITH 6 MG DAILY 90 tablet 3   warfarin (COUMADIN ) 6 MG tablet Take 1 tablet (6 mg total) by mouth daily. Alternating with 5 mg daily 30 tablet 2   No facility-administered medications prior to visit.    Review of Systems;  Patient denies headache, fevers, malaise, unintentional weight loss, skin rash, eye pain, sinus congestion and sinus pain, sore throat, dysphagia,  hemoptysis , cough, dyspnea, wheezing, chest pain, palpitations, orthopnea, edema, abdominal pain, nausea, melena, diarrhea, constipation, flank pain, dysuria, hematuria, urinary  Frequency, nocturia, numbness, tingling, seizures,  Focal weakness, Loss of consciousness,  Tremor, insomnia, depression, anxiety, and suicidal ideation.      Objective:  There were no vitals taken for this visit.  BP Readings from Last 3 Encounters:  05/14/24 131/74  02/18/24 122/84  10/03/23 103/60    Wt Readings from Last 3 Encounters:  05/14/24 (!) 338 lb (153.3 kg)  02/18/24 (!) 306 lb (138.8 kg)  01/26/24 300 lb (136.1 kg)    Physical Exam  Lab Results  Component Value Date   HGBA1C 9.1 (H) 02/18/2024   HGBA1C 7.9 (A) 09/24/2023   HGBA1C 5.9 07/04/2023    Lab Results  Component Value Date   CREATININE 0.92 02/18/2024   CREATININE 1.02 10/10/2023   CREATININE 0.92 09/24/2023     Lab Results  Component Value Date   WBC 11.2 (H) 09/02/2023   HGB 13.4 09/02/2023   HCT 39.5 09/02/2023   PLT 300 09/02/2023   GLUCOSE 100 (H) 02/18/2024   CHOL 163 02/18/2024   TRIG 261.0 (H) 02/18/2024   HDL 31.90 (L) 02/18/2024   LDLDIRECT 83.0 02/18/2024   LDLCALC 79 02/18/2024   ALT 38 02/18/2024   AST 18 02/18/2024   NA 135 02/18/2024   K 4.2 02/18/2024   CL 100 02/18/2024   CREATININE 0.92 02/18/2024   BUN 13 02/18/2024   CO2 25 02/18/2024   TSH 2.29 03/27/2022   PSA 0.47 03/27/2022   INR 2.2 07/30/2024   HGBA1C 9.1 (H) 02/18/2024   MICROALBUR 34.0 (H) 02/18/2024    ECHO TEE Result Date: 09/02/2023    TRANSESOPHOGEAL ECHO REPORT   Patient Name:   Connor Ross [facility] Date of Exam: 09/02/2023 Medical Rec #:  [id number]             Height:       71.0 in Accession #:    [id number]            Weight:       290.1 lb Date of Birth:  [birth year]             BSA:          2.470 m Patient Age:    57 years              BP:           109/67 mmHg Patient Gender: M                     HR:           89 bpm. Exam Location:  [facility] Procedure: Transesophageal Echo, Color Doppler, Cardiac Doppler and 3D Echo            (Both Spectral and Color Flow Doppler were utilized during            procedure). Indications:     Bacteremia  History:         Patient has prior history of Echocardiogram examinations, most                  recent 06/25/2024. Risk Factors:Sleep Apnea and Hypertension.  Sonographer:     Christopher Furnace Sonographer#2:   Naomie Reef Referring Phys:  CELESTER ALTER A KHAN Diagnosing Phys: Keller Paterson PROCEDURE: After discussion of the risks and benefits of a TEE, an informed consent was obtained from the patient. The transesophogeal probe was passed without difficulty through the esophogus of the patient. Imaged were obtained with the patient in a left lateral decubitus position. Sedation performed by different  physician. The patient was monitored while under deep sedation. Image  quality was excellent. The patient's vital signs; including heart rate, blood pressure, and oxygen  saturation; remained  stable throughout the procedure. The patient developed no complications during the procedure.  IMPRESSIONS  1. Left ventricular ejection fraction, by estimation, is 60 to 65%. The left ventricle has normal function.  2. Right ventricular systolic function is normal. The right ventricular size is normal.  3. No left atrial/left atrial appendage thrombus was detected.  4. Large (2.2 cm X 0.7 cm) mobile echogenic structure noted on mitral valve consistent with vegetation. Trivial mitral regurgitation.  5. The aortic valve is tricuspid. Aortic valve regurgitation is not visualized.  6. 3D performed of the mitral valve. FINDINGS  Left Ventricle: Left ventricular ejection fraction, by estimation, is 60 to 65%. The left ventricle has normal function. The left ventricular internal cavity size was normal in size. Right Ventricle: The right ventricular size is normal. No increase in right ventricular wall thickness. Right ventricular systolic function is normal. Left Atrium: Left atrial size was normal in size. No left atrial/left atrial appendage thrombus was detected. Right Atrium: Right atrial size was normal in size. Pericardium: There is no evidence of pericardial effusion. Mitral Valve: Large (2.2 cm X 0.7 cm) mobile echogenic structure noted on mitral valve consistent with vegetation. Trivial mitral regurgitation. Tricuspid Valve: The tricuspid valve is normal in structure. Tricuspid valve regurgitation is trivial. Aortic Valve: The aortic valve is tricuspid. Aortic valve regurgitation is not visualized. Pulmonic Valve: The pulmonic valve was normal in structure. Pulmonic valve regurgitation is not visualized. Aorta: The aortic root is normal in size and structure. IAS/Shunts: No atrial level shunt detected by color flow Doppler. Additional Comments: 3D imaging was not performed. Keller Paterson  Electronically signed by Keller Paterson Signature Date/Time: 09/02/2023/5:33:48 PM    Final     Assessment & Plan:  .There are no diagnoses linked to this encounter.   I spent 34 minutes on the day of this face to face encounter reviewing patient's  most recent visit with cardiology,  nephrology,  and neurology,  prior relevant surgical and non surgical procedures, recent  labs and imaging studies, counseling on weight management,  reviewing the assessment and plan with patient, and post visit ordering and reviewing of  diagnostics and therapeutics with patient  .   Follow-up: No follow-ups on file.   Verneita LITTIE Kettering, MD "

## 2024-07-30 NOTE — Telephone Encounter (Signed)
 INR Results sent to S drive.

## 2024-07-30 NOTE — Patient Instructions (Addendum)
 INCREASE THE WARFARIN TO  6 MG DAILY   TRAMADOL  : INCREASE DOSE TO 100 MG TWICE DAILY   HOME HEALTH REFERRAL FOR PHYSICAL THERAPY AND OCCUPATIONAL THERAPY AND HOPEFULLY AN RN TO DRAW LABS

## 2024-07-31 NOTE — Assessment & Plan Note (Signed)
 He has reached maximal therapeutic value on ozempic .  Changing to mounjaro  for additional dose titration options

## 2024-07-31 NOTE — Assessment & Plan Note (Signed)
 Increas warfarin dose to 6 mg daily (7=42 mg /week) for goal INR > 2.5

## 2024-07-31 NOTE — Assessment & Plan Note (Signed)
 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 Home PT  referral made and tramadol   added for  pain management

## 2024-07-31 NOTE — Assessment & Plan Note (Signed)
 He is not a candidate for surgery due to his weight > 300 lbs > Home  PT ordered and tramadol  refilled

## 2024-08-02 ENCOUNTER — Telehealth: Payer: Self-pay | Admitting: *Deleted

## 2024-08-02 NOTE — Telephone Encounter (Signed)
 Copied from CRM [id number]. Topic: Clinical - Prescription Issue >> Aug 02, 2024  9:34 AM Rea ORN wrote: Reason for CRM: Pt wife Darice called to advise that his rx for Tramadol  was sent to [facility] but it was supposed to be sent to [facility]. Darice said they will pick up the script from [facility] today but wants the rx transferred to [facility] for all the refills.    Please call back to advise,  [phone]

## 2024-08-02 NOTE — Telephone Encounter (Signed)
 Pt's wife would like to know if the next refill for Tramadol  could be sent to [facility] instead of local pharmacy.

## 2024-08-02 NOTE — Telephone Encounter (Signed)
 Spoke with pt's wife and she stated that he had been getting his Tramadol  through mail order before. Wife also wanted to know if pt should still be taking vitamin d . He was on the megadose at point but finished it and wasn't sure if he was supposed to still be taking it or if it would interfere with his warfarin. They also need a new handicap placard form completed. I have placed in quick sign folder.

## 2024-08-04 ENCOUNTER — Telehealth: Payer: Self-pay

## 2024-08-04 ENCOUNTER — Other Ambulatory Visit (HOSPITAL_COMMUNITY): Payer: Self-pay

## 2024-08-04 NOTE — Telephone Encounter (Signed)
 Pt's wife still has not heard from home health referral that was placed on 07/30/2024.

## 2024-08-04 NOTE — Telephone Encounter (Signed)
 Pharmacy Patient Advocate Encounter   Received notification from [employer] that prior authorization for Freestyle Libre 3 Plus Sensor is required/requested.   Insurance verification completed.   The patient is insured through [employer].   Per test claim: Refill too soon. PA is not needed at this time. Medication was filled 07/29/2024. Next eligible fill date is 10/05/2024.

## 2024-08-04 NOTE — Telephone Encounter (Signed)
 Copied from CRM #[id number]. Topic: Referral - Status >> Aug 04, 2024 11:04 AM Pinkey ORN wrote: Reason for CRM: Referral Status >> Aug 04, 2024 11:05 AM Pinkey ORN wrote: Darice wife called on behalf of patient's referral. States she was advised by Marylynn Verneita CROME, MD that if she hadn't heard anything, then to call and follow up. Therefor she's calling wanting to know the status of the referral.

## 2024-08-05 ENCOUNTER — Telehealth: Payer: Self-pay

## 2024-08-05 NOTE — Telephone Encounter (Signed)
 Mychart message sent letting pt know handicap placard is ready for pick up.

## 2024-08-05 NOTE — Telephone Encounter (Signed)
 Reached out to Patient again for POI document requested by BI (Jardiance ) to proceed with PAP application- will be mailed soon per patient's spouse.

## 2024-08-06 LAB — POCT INR: INR: 3.2 — AB (ref 0.80–1.20)

## 2024-08-09 NOTE — Telephone Encounter (Signed)
 noted

## 2024-08-10 ENCOUNTER — Telehealth: Payer: Self-pay

## 2024-08-10 ENCOUNTER — Telehealth: Payer: Self-pay | Admitting: Internal Medicine

## 2024-08-10 NOTE — Telephone Encounter (Signed)
 See previous message

## 2024-08-10 NOTE — Telephone Encounter (Signed)
Pt's wife is aware and gave a verbal understanding.

## 2024-08-10 NOTE — Telephone Encounter (Signed)
 Spoke with pt's wife and she stated that pt received a call today stating that his Mounjaro  will cost him $300 so he will not be taking that medication.

## 2024-08-10 NOTE — Telephone Encounter (Signed)
 Spoke with pt's wife and she wanted to know what pt needed to do about his INR from Friday. INR on 08/06/2024 was 3.2. I let wife know that we never received a fax from [facility] about his INR.   Abstracted

## 2024-08-10 NOTE — Telephone Encounter (Signed)
 Verbal orders given

## 2024-08-10 NOTE — Telephone Encounter (Signed)
 INR results received. Sent to S: drive.

## 2024-08-10 NOTE — Telephone Encounter (Signed)
 Copied from CRM [id number]. Topic: Clinical - Home Health Verbal Orders >> Aug 10, 2024  9:11 AM Rea ORN wrote: Caller/Agency: [facility] Callback Number: [phone] Service Requested: Skilled Nursing Frequency: 1 week 4, 2 month 1 and 2 PRN visits Any new concerns about the patient? No, but concerned with the pt and how he is taking care of his diabetes.

## 2024-08-11 ENCOUNTER — Telehealth: Payer: Self-pay

## 2024-08-11 ENCOUNTER — Encounter: Payer: Self-pay | Admitting: Pharmacist

## 2024-08-11 NOTE — Telephone Encounter (Signed)
 Copied from CRM [id number]. Topic: Clinical - Home Health Verbal Orders >> Aug 11, 2024  9:17 AM Emylou G wrote: Caller/Agency: Christy w/[facility] Callback Number: [phone] secured line Service Requested: Skilled Nursing Frequency: 1w4 .. 6 weeks total. up for recert March.. patient on warfarin - need the frequency for the nurse draw and parameters for the warfarin  Any new concerns about the patient? No

## 2024-08-11 NOTE — Telephone Encounter (Signed)
 Spoke with Bari to give her verbal orders for the skilled nursing. I also let her know that they will not need to draw INRs on the pt because he is using [facility] and sending us  the reading weekly. Bari gave a verbal understanding.

## 2024-08-11 NOTE — Telephone Encounter (Signed)
 Spoke with pt's wife and informed her that we have received pt's patient assistance medication and it is ready for pick up.   Tresiba : 10 boxes

## 2024-08-11 NOTE — Progress Notes (Signed)
 Chart Review Reason: Drug information Question - Medication coverage  Summary: Patient concerns regarding cost of Mounjaro  ~$300.   Current Rx insurance: Yes [employer] MEDICARE - Gold Plus plan  Considerations: Rx deductible = $250  After deductible, brand medications should be $47/month for remainder of the year  Connor Ross, [name], Connor Ross, CPP Clinical Pharmacist Practitioner [facility] Ph: [phone]

## 2024-08-11 NOTE — Telephone Encounter (Signed)
 Received POI Documents and faxed to [facility] for PAP application for Jardiance .

## 2024-08-12 NOTE — Telephone Encounter (Signed)
 Spoke with pt's wife and gave her the information below. She stated that she would let pt know and if he decided to take medication he would let [facility] know to fill it.

## 2024-08-13 ENCOUNTER — Telehealth: Payer: Self-pay | Admitting: Internal Medicine

## 2024-08-13 ENCOUNTER — Encounter: Payer: Self-pay | Admitting: Internal Medicine

## 2024-08-13 ENCOUNTER — Telehealth: Payer: Self-pay

## 2024-08-13 DIAGNOSIS — Z5181 Encounter for therapeutic drug level monitoring: Secondary | ICD-10-CM

## 2024-08-13 LAB — POCT INR: INR: 4.5 — AB (ref 0.80–1.20)

## 2024-08-13 NOTE — Telephone Encounter (Signed)
 Spoke with pt's wife pt is currently taking 6 mg daily.   INR has been abstracted.

## 2024-08-13 NOTE — Telephone Encounter (Signed)
 See my chart message

## 2024-08-13 NOTE — Telephone Encounter (Signed)
 Copied from CRM #[id number]. Topic: Clinical - Lab/Test Results >> Aug 13, 2024 11:42 AM Alfonso HERO wrote: Reason for CRM: patients value was 4.5 INR.SABRAIf these abnormal clinical findings persist, appropriate workup will be completed. The patient understands that follow up is required to elucidate the situation. You need to reach them the number is [phone]

## 2024-08-13 NOTE — Assessment & Plan Note (Signed)
 Inr is 4.5 on 6 mg daily.  Jan 30  Advised to alternate 5 mg with 6 mg and recheck one week

## 2024-08-13 NOTE — Assessment & Plan Note (Signed)
 Inr of 4.5 addressed with reduction of dose to 5 mg daily Jan 30 (2026)

## 2024-08-13 NOTE — Telephone Encounter (Signed)
 Pt's wife has picked up medication.

## 2024-08-13 NOTE — Telephone Encounter (Signed)
 INR results received and sent to [address] and handed a copy to CMA

## 2024-08-14 ENCOUNTER — Other Ambulatory Visit: Payer: Self-pay | Admitting: Internal Medicine

## 2024-08-18 NOTE — Telephone Encounter (Signed)
 Pt is currently taking 5 mg daily and will continue with 5 mg daily until he checks his INR on Friday, then will do what is advised.

## 2024-08-20 ENCOUNTER — Telehealth: Payer: Self-pay | Admitting: Internal Medicine

## 2024-08-20 ENCOUNTER — Telehealth: Payer: Self-pay

## 2024-08-20 LAB — POCT INR: INR: 2.8 — AB (ref 0.80–1.20)

## 2024-08-20 NOTE — Telephone Encounter (Signed)
 Pt's INR for 08/20/2024 is 2.8. Pt is currently taking 5 mg daily.   Abstracted.

## 2024-08-20 NOTE — Telephone Encounter (Signed)
 INR results received and sent to [facility]

## 2024-08-20 NOTE — Telephone Encounter (Signed)
 See my chart message

## 2024-08-20 NOTE — Telephone Encounter (Signed)
 Copied from CRM [id number]. Topic: Clinical - Home Health Verbal Orders >> Aug 20, 2024 12:11 PM Sophia H wrote: Caller/Agency: Jori GLENWOOD [facility]  Callback Number: [phone] (vmail [phone]) Service Requested: Occupational Therapy Frequency: Once a week for 6 weeks  Any new concerns about the patient? No

## 2024-08-20 NOTE — Telephone Encounter (Signed)
 Verbal orders given

## 2025-01-31 ENCOUNTER — Ambulatory Visit
